# Patient Record
Sex: Male | Born: 1960 | Race: Black or African American | Hispanic: No | Marital: Single | Smoking: Never smoker
Health system: Southern US, Community
[De-identification: ages and names within clinical notes are randomized; demographics above are authoritative.]

## PROBLEM LIST (undated history)

## (undated) DIAGNOSIS — Z9989 Dependence on other enabling machines and devices: Secondary | ICD-10-CM

## (undated) DIAGNOSIS — I428 Other cardiomyopathies: Secondary | ICD-10-CM

## (undated) DIAGNOSIS — I472 Ventricular tachycardia, unspecified: Secondary | ICD-10-CM

## (undated) DIAGNOSIS — K819 Cholecystitis, unspecified: Secondary | ICD-10-CM

## (undated) DIAGNOSIS — I10 Essential (primary) hypertension: Secondary | ICD-10-CM

## (undated) DIAGNOSIS — E785 Hyperlipidemia, unspecified: Secondary | ICD-10-CM

## (undated) DIAGNOSIS — Z95810 Presence of automatic (implantable) cardiac defibrillator: Secondary | ICD-10-CM

## (undated) DIAGNOSIS — M1990 Unspecified osteoarthritis, unspecified site: Secondary | ICD-10-CM

## (undated) DIAGNOSIS — K5730 Diverticulosis of large intestine without perforation or abscess without bleeding: Secondary | ICD-10-CM

## (undated) DIAGNOSIS — F1010 Alcohol abuse, uncomplicated: Secondary | ICD-10-CM

## (undated) DIAGNOSIS — E119 Type 2 diabetes mellitus without complications: Secondary | ICD-10-CM

## (undated) DIAGNOSIS — I480 Paroxysmal atrial fibrillation: Secondary | ICD-10-CM

## (undated) DIAGNOSIS — I5022 Chronic systolic (congestive) heart failure: Secondary | ICD-10-CM

## (undated) DIAGNOSIS — D649 Anemia, unspecified: Secondary | ICD-10-CM

## (undated) DIAGNOSIS — J189 Pneumonia, unspecified organism: Secondary | ICD-10-CM

## (undated) DIAGNOSIS — I272 Pulmonary hypertension, unspecified: Secondary | ICD-10-CM

## (undated) DIAGNOSIS — G4733 Obstructive sleep apnea (adult) (pediatric): Secondary | ICD-10-CM

## (undated) DIAGNOSIS — N289 Disorder of kidney and ureter, unspecified: Secondary | ICD-10-CM

## (undated) DIAGNOSIS — K648 Other hemorrhoids: Secondary | ICD-10-CM

## (undated) HISTORY — DX: Diverticulosis of large intestine without perforation or abscess without bleeding: K57.30

## (undated) HISTORY — DX: Other hemorrhoids: K64.8

## (undated) HISTORY — DX: Paroxysmal atrial fibrillation: I48.0

## (undated) HISTORY — DX: Alcohol abuse, uncomplicated: F10.10

## (undated) HISTORY — DX: Essential (primary) hypertension: I10

## (undated) HISTORY — DX: Anemia, unspecified: D64.9

## (undated) HISTORY — DX: Morbid (severe) obesity due to excess calories: E66.01

## (undated) HISTORY — DX: Ventricular tachycardia, unspecified: I47.20

## (undated) HISTORY — DX: Chronic systolic (congestive) heart failure: I50.22

## (undated) HISTORY — DX: Pulmonary hypertension, unspecified: I27.20

## (undated) HISTORY — DX: Ventricular tachycardia: I47.2

## (undated) HISTORY — DX: Cholecystitis, unspecified: K81.9

---

## 1998-02-26 ENCOUNTER — Emergency Department (HOSPITAL_COMMUNITY): Admission: EM | Admit: 1998-02-26 | Discharge: 1998-02-26 | Payer: Self-pay | Admitting: Emergency Medicine

## 1998-05-30 ENCOUNTER — Ambulatory Visit (HOSPITAL_COMMUNITY): Admission: RE | Admit: 1998-05-30 | Discharge: 1998-05-30 | Payer: Self-pay

## 1998-07-18 ENCOUNTER — Ambulatory Visit (HOSPITAL_COMMUNITY): Admission: RE | Admit: 1998-07-18 | Discharge: 1998-07-18 | Payer: Self-pay | Admitting: Neurosurgery

## 1998-08-02 ENCOUNTER — Encounter: Payer: Self-pay | Admitting: Neurosurgery

## 1998-08-02 ENCOUNTER — Ambulatory Visit (HOSPITAL_COMMUNITY): Admission: RE | Admit: 1998-08-02 | Discharge: 1998-08-02 | Payer: Self-pay | Admitting: Neurosurgery

## 1998-08-08 ENCOUNTER — Encounter: Payer: Self-pay | Admitting: Neurosurgery

## 1998-08-08 ENCOUNTER — Ambulatory Visit (HOSPITAL_COMMUNITY): Admission: RE | Admit: 1998-08-08 | Discharge: 1998-08-08 | Payer: Self-pay | Admitting: Neurosurgery

## 1998-08-17 ENCOUNTER — Ambulatory Visit (HOSPITAL_COMMUNITY): Admission: RE | Admit: 1998-08-17 | Discharge: 1998-08-17 | Payer: Self-pay | Admitting: Neurosurgery

## 1998-08-17 ENCOUNTER — Encounter: Payer: Self-pay | Admitting: Neurosurgery

## 1998-09-04 ENCOUNTER — Encounter: Payer: Self-pay | Admitting: Neurosurgery

## 1998-09-04 ENCOUNTER — Ambulatory Visit (HOSPITAL_COMMUNITY): Admission: RE | Admit: 1998-09-04 | Discharge: 1998-09-04 | Payer: Self-pay | Admitting: Neurosurgery

## 1999-09-23 ENCOUNTER — Emergency Department (HOSPITAL_COMMUNITY): Admission: EM | Admit: 1999-09-23 | Discharge: 1999-09-23 | Payer: Self-pay | Admitting: Internal Medicine

## 2000-04-24 ENCOUNTER — Encounter: Payer: Self-pay | Admitting: Emergency Medicine

## 2000-04-24 ENCOUNTER — Emergency Department (HOSPITAL_COMMUNITY): Admission: EM | Admit: 2000-04-24 | Discharge: 2000-04-24 | Payer: Self-pay | Admitting: Emergency Medicine

## 2001-01-22 ENCOUNTER — Encounter: Payer: Self-pay | Admitting: Emergency Medicine

## 2001-01-22 ENCOUNTER — Inpatient Hospital Stay (HOSPITAL_COMMUNITY): Admission: EM | Admit: 2001-01-22 | Discharge: 2001-01-25 | Payer: Self-pay | Admitting: Emergency Medicine

## 2001-01-23 ENCOUNTER — Encounter: Payer: Self-pay | Admitting: Internal Medicine

## 2001-03-31 ENCOUNTER — Encounter: Admission: RE | Admit: 2001-03-31 | Discharge: 2001-06-29 | Payer: Self-pay | Admitting: Family Medicine

## 2001-04-06 ENCOUNTER — Encounter: Payer: Self-pay | Admitting: Internal Medicine

## 2001-04-06 ENCOUNTER — Ambulatory Visit (HOSPITAL_BASED_OUTPATIENT_CLINIC_OR_DEPARTMENT_OTHER): Admission: RE | Admit: 2001-04-06 | Discharge: 2001-04-06 | Payer: Self-pay | Admitting: Family Medicine

## 2001-07-02 ENCOUNTER — Encounter: Payer: Self-pay | Admitting: Pulmonary Disease

## 2001-09-06 ENCOUNTER — Encounter: Payer: Self-pay | Admitting: Emergency Medicine

## 2001-09-06 ENCOUNTER — Inpatient Hospital Stay (HOSPITAL_COMMUNITY): Admission: EM | Admit: 2001-09-06 | Discharge: 2001-09-10 | Payer: Self-pay | Admitting: Emergency Medicine

## 2001-09-08 ENCOUNTER — Encounter: Payer: Self-pay | Admitting: Internal Medicine

## 2001-10-23 ENCOUNTER — Emergency Department (HOSPITAL_COMMUNITY): Admission: EM | Admit: 2001-10-23 | Discharge: 2001-10-23 | Payer: Self-pay | Admitting: *Deleted

## 2002-02-17 ENCOUNTER — Encounter: Payer: Self-pay | Admitting: Pulmonary Disease

## 2002-07-16 ENCOUNTER — Encounter: Payer: Self-pay | Admitting: Emergency Medicine

## 2002-07-17 ENCOUNTER — Inpatient Hospital Stay (HOSPITAL_COMMUNITY): Admission: EM | Admit: 2002-07-17 | Discharge: 2002-07-18 | Payer: Self-pay | Admitting: Emergency Medicine

## 2004-03-07 ENCOUNTER — Inpatient Hospital Stay (HOSPITAL_COMMUNITY): Admission: EM | Admit: 2004-03-07 | Discharge: 2004-03-11 | Payer: Self-pay

## 2004-03-07 ENCOUNTER — Encounter (INDEPENDENT_AMBULATORY_CARE_PROVIDER_SITE_OTHER): Payer: Self-pay | Admitting: Cardiovascular Disease

## 2004-03-08 HISTORY — PX: CARDIAC CATHETERIZATION: SHX172

## 2004-04-05 ENCOUNTER — Ambulatory Visit (HOSPITAL_COMMUNITY): Admission: RE | Admit: 2004-04-05 | Discharge: 2004-04-05 | Payer: Self-pay | Admitting: Family Medicine

## 2004-07-31 ENCOUNTER — Ambulatory Visit: Payer: Self-pay | Admitting: *Deleted

## 2004-08-21 ENCOUNTER — Ambulatory Visit: Payer: Self-pay | Admitting: Pulmonary Disease

## 2004-09-23 ENCOUNTER — Ambulatory Visit: Payer: Self-pay | Admitting: Family Medicine

## 2005-02-13 ENCOUNTER — Ambulatory Visit: Payer: Self-pay | Admitting: Internal Medicine

## 2005-04-23 ENCOUNTER — Ambulatory Visit: Payer: Self-pay | Admitting: Internal Medicine

## 2005-05-06 ENCOUNTER — Encounter: Admission: RE | Admit: 2005-05-06 | Discharge: 2005-08-04 | Payer: Self-pay | Admitting: Internal Medicine

## 2005-07-01 ENCOUNTER — Ambulatory Visit: Payer: Self-pay | Admitting: Internal Medicine

## 2005-08-27 ENCOUNTER — Ambulatory Visit (HOSPITAL_COMMUNITY): Admission: RE | Admit: 2005-08-27 | Discharge: 2005-08-27 | Payer: Self-pay | Admitting: *Deleted

## 2005-10-15 ENCOUNTER — Ambulatory Visit: Payer: Self-pay | Admitting: Internal Medicine

## 2006-01-21 ENCOUNTER — Ambulatory Visit: Payer: Self-pay | Admitting: Pulmonary Disease

## 2006-03-21 ENCOUNTER — Emergency Department (HOSPITAL_COMMUNITY): Admission: EM | Admit: 2006-03-21 | Discharge: 2006-03-21 | Payer: Self-pay | Admitting: Emergency Medicine

## 2006-04-28 ENCOUNTER — Ambulatory Visit: Payer: Self-pay | Admitting: Internal Medicine

## 2006-06-16 ENCOUNTER — Ambulatory Visit: Payer: Self-pay | Admitting: Internal Medicine

## 2006-06-16 ENCOUNTER — Encounter: Payer: Self-pay | Admitting: Cardiovascular Disease

## 2006-06-16 ENCOUNTER — Inpatient Hospital Stay (HOSPITAL_COMMUNITY): Admission: EM | Admit: 2006-06-16 | Discharge: 2006-06-19 | Payer: Self-pay | Admitting: Emergency Medicine

## 2006-07-01 ENCOUNTER — Ambulatory Visit: Payer: Self-pay | Admitting: Internal Medicine

## 2007-01-28 ENCOUNTER — Ambulatory Visit: Payer: Self-pay | Admitting: Internal Medicine

## 2007-01-28 LAB — CONVERTED CEMR LAB
ALT: 28 units/L (ref 0–40)
Albumin: 3.9 g/dL (ref 3.5–5.2)
Alkaline Phosphatase: 85 units/L (ref 39–117)
BUN: 15 mg/dL (ref 6–23)
CO2: 32 meq/L (ref 19–32)
Creatinine, Ser: 1 mg/dL (ref 0.4–1.5)
Creatinine,U: 227.8 mg/dL
Glucose, Bld: 127 mg/dL — ABNORMAL HIGH (ref 70–99)
Hgb A1c MFr Bld: 6.6 % — ABNORMAL HIGH (ref 4.6–6.0)
Sodium: 139 meq/L (ref 135–145)
Total Bilirubin: 0.7 mg/dL (ref 0.3–1.2)
Total Protein: 8.1 g/dL (ref 6.0–8.3)

## 2007-02-05 ENCOUNTER — Ambulatory Visit: Payer: Self-pay | Admitting: Pulmonary Disease

## 2007-05-07 DIAGNOSIS — E1165 Type 2 diabetes mellitus with hyperglycemia: Secondary | ICD-10-CM

## 2007-05-07 DIAGNOSIS — J45909 Unspecified asthma, uncomplicated: Secondary | ICD-10-CM | POA: Insufficient documentation

## 2007-05-07 DIAGNOSIS — D509 Iron deficiency anemia, unspecified: Secondary | ICD-10-CM

## 2007-05-07 DIAGNOSIS — I509 Heart failure, unspecified: Secondary | ICD-10-CM | POA: Insufficient documentation

## 2007-05-07 DIAGNOSIS — G4733 Obstructive sleep apnea (adult) (pediatric): Secondary | ICD-10-CM

## 2007-05-07 DIAGNOSIS — M109 Gout, unspecified: Secondary | ICD-10-CM

## 2007-05-07 DIAGNOSIS — I10 Essential (primary) hypertension: Secondary | ICD-10-CM | POA: Insufficient documentation

## 2007-05-07 DIAGNOSIS — Z794 Long term (current) use of insulin: Secondary | ICD-10-CM

## 2007-05-17 ENCOUNTER — Ambulatory Visit: Payer: Self-pay | Admitting: Internal Medicine

## 2007-05-21 ENCOUNTER — Ambulatory Visit: Payer: Self-pay | Admitting: Internal Medicine

## 2007-06-22 ENCOUNTER — Ambulatory Visit: Payer: Self-pay | Admitting: Internal Medicine

## 2007-07-30 ENCOUNTER — Encounter: Payer: Self-pay | Admitting: Internal Medicine

## 2007-10-04 ENCOUNTER — Emergency Department (HOSPITAL_COMMUNITY): Admission: EM | Admit: 2007-10-04 | Discharge: 2007-10-05 | Payer: Self-pay | Admitting: Emergency Medicine

## 2007-10-14 ENCOUNTER — Ambulatory Visit: Payer: Self-pay | Admitting: Internal Medicine

## 2007-10-20 ENCOUNTER — Telehealth: Payer: Self-pay | Admitting: Internal Medicine

## 2007-10-27 LAB — CONVERTED CEMR LAB
ALT: 66 units/L — ABNORMAL HIGH (ref 0–53)
BUN: 22 mg/dL (ref 6–23)
Bilirubin, Direct: 0.3 mg/dL (ref 0.0–0.3)
Creatinine, Ser: 1.6 mg/dL — ABNORMAL HIGH (ref 0.4–1.5)
Hgb A1c MFr Bld: 6.3 % — ABNORMAL HIGH (ref 4.6–6.0)
Microalb Creat Ratio: 646.4 mg/g — ABNORMAL HIGH (ref 0.0–30.0)
Potassium: 4.5 meq/L (ref 3.5–5.1)
Sodium: 134 meq/L — ABNORMAL LOW (ref 135–145)
Total Bilirubin: 1.3 mg/dL — ABNORMAL HIGH (ref 0.3–1.2)

## 2007-11-05 ENCOUNTER — Ambulatory Visit: Payer: Self-pay | Admitting: Internal Medicine

## 2007-11-05 ENCOUNTER — Encounter (INDEPENDENT_AMBULATORY_CARE_PROVIDER_SITE_OTHER): Payer: Self-pay | Admitting: *Deleted

## 2007-11-05 DIAGNOSIS — R109 Unspecified abdominal pain: Secondary | ICD-10-CM | POA: Insufficient documentation

## 2007-11-05 DIAGNOSIS — K625 Hemorrhage of anus and rectum: Secondary | ICD-10-CM | POA: Insufficient documentation

## 2007-11-09 LAB — CONVERTED CEMR LAB
AST: 30 units/L (ref 0–37)
BUN: 18 mg/dL (ref 6–23)
Bilirubin, Direct: 0.6 mg/dL — ABNORMAL HIGH (ref 0.0–0.3)
CO2: 30 meq/L (ref 19–32)
Chloride: 101 meq/L (ref 96–112)
GFR calc Af Amer: 65 mL/min
GFR calc non Af Amer: 54 mL/min
Glucose, Bld: 129 mg/dL — ABNORMAL HIGH (ref 70–99)
Potassium: 4.5 meq/L (ref 3.5–5.1)
Total Bilirubin: 2.5 mg/dL — ABNORMAL HIGH (ref 0.3–1.2)

## 2007-11-16 ENCOUNTER — Encounter: Payer: Self-pay | Admitting: Internal Medicine

## 2007-12-17 ENCOUNTER — Ambulatory Visit: Payer: Self-pay | Admitting: Internal Medicine

## 2007-12-17 ENCOUNTER — Inpatient Hospital Stay (HOSPITAL_COMMUNITY): Admission: EM | Admit: 2007-12-17 | Discharge: 2007-12-21 | Payer: Self-pay | Admitting: Emergency Medicine

## 2007-12-20 ENCOUNTER — Encounter (INDEPENDENT_AMBULATORY_CARE_PROVIDER_SITE_OTHER): Payer: Self-pay | Admitting: *Deleted

## 2007-12-21 ENCOUNTER — Encounter: Payer: Self-pay | Admitting: Internal Medicine

## 2007-12-22 ENCOUNTER — Ambulatory Visit: Payer: Self-pay | Admitting: Internal Medicine

## 2008-01-05 ENCOUNTER — Encounter: Payer: Self-pay | Admitting: Internal Medicine

## 2008-02-07 ENCOUNTER — Telehealth: Payer: Self-pay | Admitting: Internal Medicine

## 2008-02-14 ENCOUNTER — Encounter: Payer: Self-pay | Admitting: Internal Medicine

## 2008-03-10 ENCOUNTER — Telehealth: Payer: Self-pay | Admitting: Internal Medicine

## 2008-03-24 ENCOUNTER — Telehealth: Payer: Self-pay | Admitting: Internal Medicine

## 2008-03-27 ENCOUNTER — Ambulatory Visit: Payer: Self-pay | Admitting: Pulmonary Disease

## 2008-03-30 ENCOUNTER — Encounter: Payer: Self-pay | Admitting: Internal Medicine

## 2008-03-30 ENCOUNTER — Ambulatory Visit (HOSPITAL_COMMUNITY): Admission: RE | Admit: 2008-03-30 | Discharge: 2008-03-30 | Payer: Self-pay | Admitting: Internal Medicine

## 2008-04-03 ENCOUNTER — Ambulatory Visit: Payer: Self-pay | Admitting: Internal Medicine

## 2008-04-04 ENCOUNTER — Ambulatory Visit: Payer: Self-pay | Admitting: Internal Medicine

## 2008-04-04 DIAGNOSIS — R1013 Epigastric pain: Secondary | ICD-10-CM

## 2008-04-04 DIAGNOSIS — K3189 Other diseases of stomach and duodenum: Secondary | ICD-10-CM

## 2008-04-04 LAB — CONVERTED CEMR LAB
ALT: 26 units/L (ref 0–53)
AST: 30 units/L (ref 0–37)
Albumin: 3.4 g/dL — ABNORMAL LOW (ref 3.5–5.2)
Alkaline Phosphatase: 90 units/L (ref 39–117)
Bilirubin, Direct: 0.8 mg/dL — ABNORMAL HIGH (ref 0.0–0.3)
CO2: 28 meq/L (ref 19–32)
Digitoxin Lvl: 0.5 ng/mL — ABNORMAL LOW (ref 0.8–2.0)
Glucose, Bld: 124 mg/dL — ABNORMAL HIGH (ref 70–99)
LDL Cholesterol: 78 mg/dL (ref 0–99)
Total Bilirubin: 2.4 mg/dL — ABNORMAL HIGH (ref 0.3–1.2)
Total Protein: 6.7 g/dL (ref 6.0–8.3)

## 2008-04-09 ENCOUNTER — Encounter: Payer: Self-pay | Admitting: Internal Medicine

## 2008-04-10 ENCOUNTER — Encounter: Admission: RE | Admit: 2008-04-10 | Discharge: 2008-04-10 | Payer: Self-pay | Admitting: Internal Medicine

## 2008-04-10 ENCOUNTER — Telehealth: Payer: Self-pay | Admitting: Internal Medicine

## 2008-04-10 DIAGNOSIS — K819 Cholecystitis, unspecified: Secondary | ICD-10-CM

## 2008-04-12 ENCOUNTER — Telehealth: Payer: Self-pay | Admitting: Pulmonary Disease

## 2008-04-17 ENCOUNTER — Telehealth: Payer: Self-pay | Admitting: Internal Medicine

## 2008-04-18 ENCOUNTER — Encounter: Payer: Self-pay | Admitting: Internal Medicine

## 2008-04-27 ENCOUNTER — Ambulatory Visit: Payer: Self-pay | Admitting: Internal Medicine

## 2008-04-27 ENCOUNTER — Telehealth: Payer: Self-pay | Admitting: Internal Medicine

## 2008-04-27 LAB — CONVERTED CEMR LAB
AST: 29 units/L (ref 0–37)
Basophils Relative: 0.8 % (ref 0.0–3.0)
Eosinophils Absolute: 0.1 10*3/uL (ref 0.0–0.7)
Eosinophils Relative: 2.3 % (ref 0.0–5.0)
Hemoglobin: 14 g/dL (ref 13.0–17.0)
Lymphocytes Relative: 42.2 % (ref 12.0–46.0)
MCHC: 33.3 g/dL (ref 30.0–36.0)
MCV: 88.2 fL (ref 78.0–100.0)
Monocytes Absolute: 0.5 10*3/uL (ref 0.1–1.0)
Monocytes Relative: 10.8 % (ref 3.0–12.0)
Neutro Abs: 1.8 10*3/uL (ref 1.4–7.7)
Neutrophils Relative %: 43.9 % (ref 43.0–77.0)
RBC: 4.76 M/uL (ref 4.22–5.81)
Total Protein: 7.1 g/dL (ref 6.0–8.3)

## 2008-05-01 ENCOUNTER — Ambulatory Visit (HOSPITAL_COMMUNITY): Admission: RE | Admit: 2008-05-01 | Discharge: 2008-05-01 | Payer: Self-pay | Admitting: Internal Medicine

## 2008-05-02 DIAGNOSIS — F1021 Alcohol dependence, in remission: Secondary | ICD-10-CM

## 2008-05-02 DIAGNOSIS — K648 Other hemorrhoids: Secondary | ICD-10-CM | POA: Insufficient documentation

## 2008-05-02 DIAGNOSIS — I272 Pulmonary hypertension, unspecified: Secondary | ICD-10-CM

## 2008-05-02 DIAGNOSIS — K5730 Diverticulosis of large intestine without perforation or abscess without bleeding: Secondary | ICD-10-CM | POA: Insufficient documentation

## 2008-05-02 DIAGNOSIS — E669 Obesity, unspecified: Secondary | ICD-10-CM | POA: Insufficient documentation

## 2008-05-23 ENCOUNTER — Encounter: Payer: Self-pay | Admitting: Internal Medicine

## 2008-05-23 ENCOUNTER — Telehealth: Payer: Self-pay | Admitting: Internal Medicine

## 2008-05-27 ENCOUNTER — Inpatient Hospital Stay (HOSPITAL_COMMUNITY): Admission: EM | Admit: 2008-05-27 | Discharge: 2008-06-07 | Payer: Self-pay | Admitting: Emergency Medicine

## 2008-05-27 ENCOUNTER — Ambulatory Visit: Payer: Self-pay | Admitting: Internal Medicine

## 2008-05-27 ENCOUNTER — Encounter: Payer: Self-pay | Admitting: Cardiovascular Disease

## 2008-05-27 HISTORY — PX: TRANSTHORACIC ECHOCARDIOGRAM: SHX275

## 2008-05-30 ENCOUNTER — Ambulatory Visit: Payer: Self-pay | Admitting: Gastroenterology

## 2008-06-06 HISTORY — PX: INSERTION OF ICD: SHX6689

## 2008-06-20 ENCOUNTER — Telehealth: Payer: Self-pay | Admitting: Internal Medicine

## 2008-08-22 HISTORY — PX: US ECHOCARDIOGRAPHY: HXRAD669

## 2009-03-16 IMAGING — CR DG ABDOMEN ACUTE W/ 1V CHEST
4 series · 4 of 4 positions shown · non-contrast
Comparison: 03/21/06.

This report is delayed due to PACS failure.
CLINICAL DATA: ACUTE ABDOMINAL SERIES ? 3 VIEW:

[w chest pa]
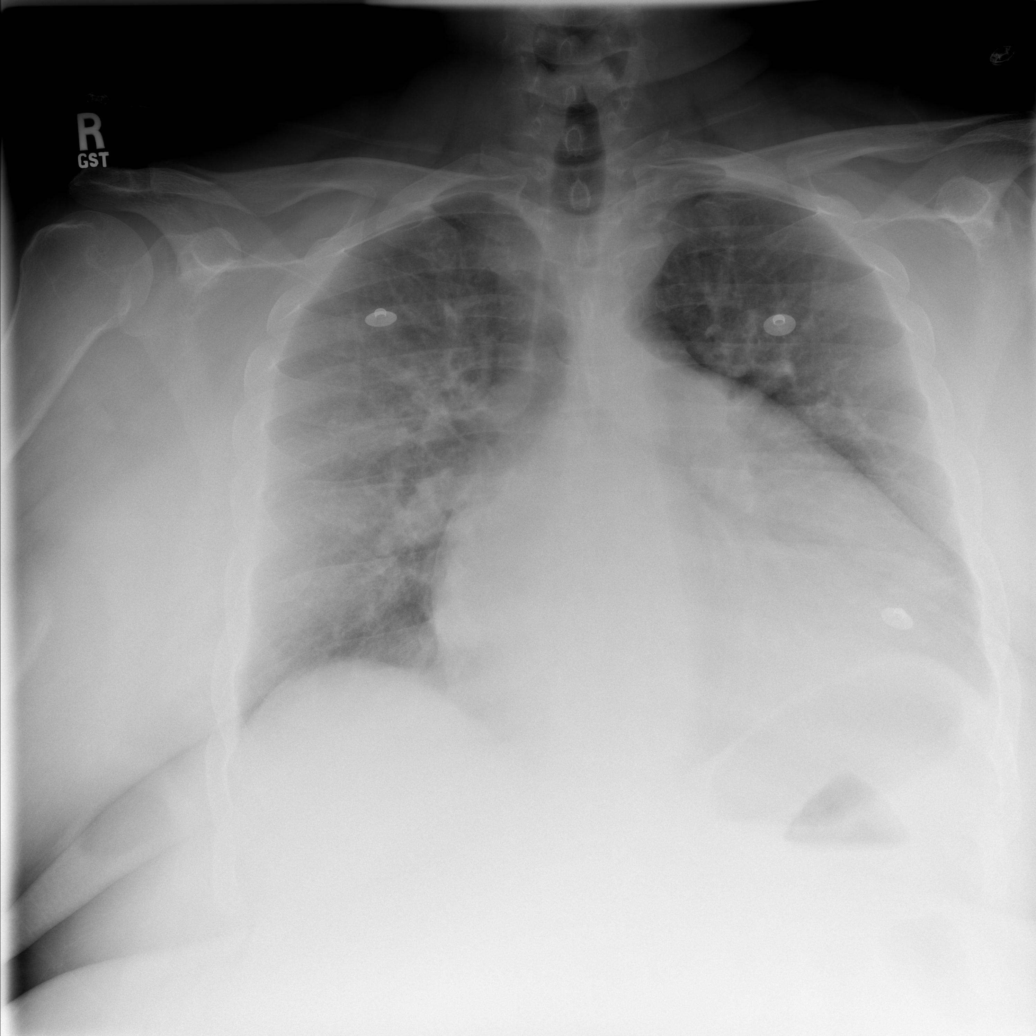

[w abdomen upright *]
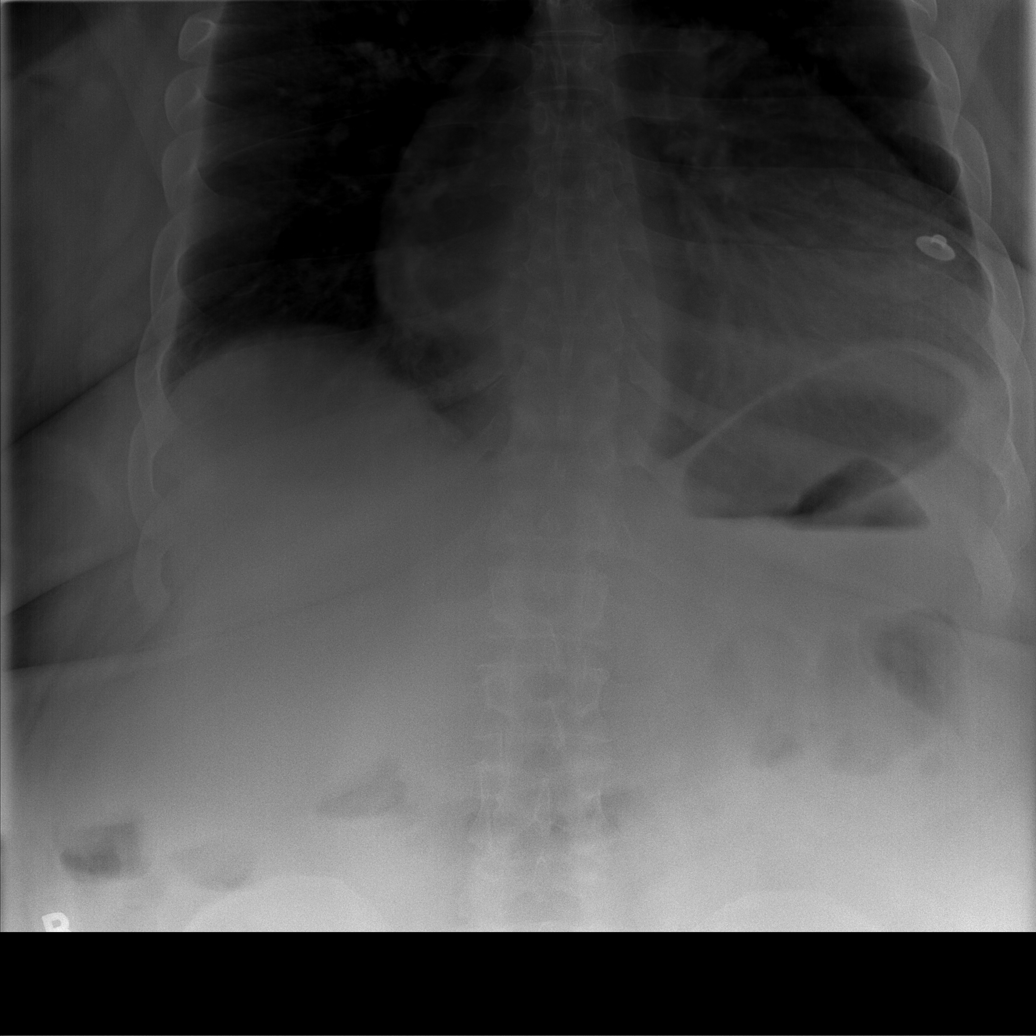

[t abdomen supine]
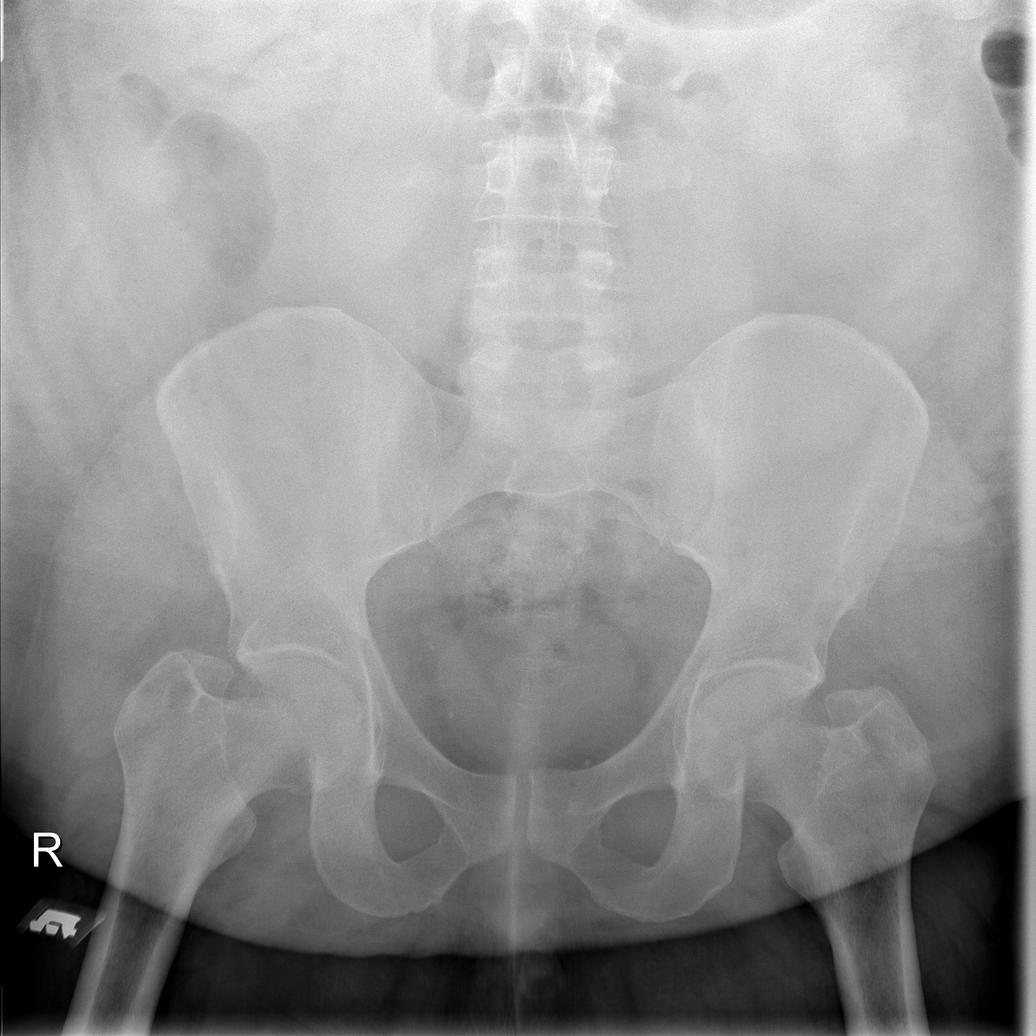

[t abdomen supine *]
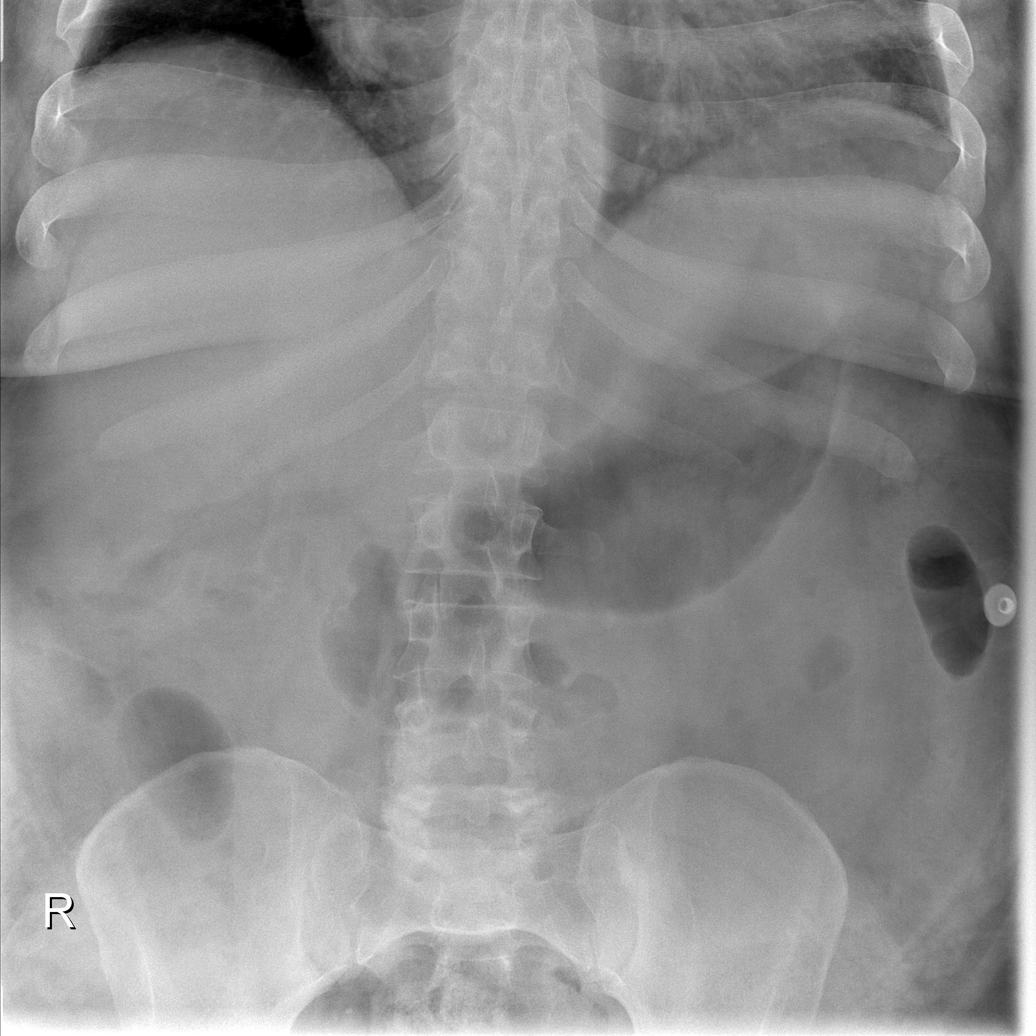

[4 of 4 positions shown; findings below may reference images not displayed]

FINDINGS: Shortness of breath and cough.  Abdominal distention.  The patient has marked cardiomegaly which is chronic.  However, there is now pulmonary vascular congestion without discrete pulmonary edema.  There is no free air or free fluid in the abdomen.  No dilated bowel.  No significant bony abnormality.
IMPRESSION: Benign appearing abdomen.  Cardiomegaly with pulmonary vascular congestion.

## 2009-07-27 ENCOUNTER — Emergency Department (HOSPITAL_COMMUNITY): Admission: EM | Admit: 2009-07-27 | Discharge: 2009-07-27 | Payer: Self-pay | Admitting: Emergency Medicine

## 2009-08-17 ENCOUNTER — Emergency Department (HOSPITAL_COMMUNITY): Admission: EM | Admit: 2009-08-17 | Discharge: 2009-08-17 | Payer: Self-pay | Admitting: Emergency Medicine

## 2009-08-24 ENCOUNTER — Ambulatory Visit: Payer: Self-pay | Admitting: Internal Medicine

## 2009-12-06 DIAGNOSIS — Z95810 Presence of automatic (implantable) cardiac defibrillator: Secondary | ICD-10-CM

## 2009-12-14 ENCOUNTER — Ambulatory Visit: Payer: Self-pay | Admitting: Internal Medicine

## 2010-03-12 ENCOUNTER — Ambulatory Visit: Payer: Self-pay | Admitting: Internal Medicine

## 2010-03-12 ENCOUNTER — Encounter: Payer: Self-pay | Admitting: Internal Medicine

## 2010-03-26 ENCOUNTER — Encounter: Payer: Self-pay | Admitting: Internal Medicine

## 2010-05-31 ENCOUNTER — Ambulatory Visit: Payer: Self-pay | Admitting: Cardiovascular Disease

## 2010-06-13 ENCOUNTER — Ambulatory Visit: Payer: Self-pay | Admitting: Internal Medicine

## 2010-06-21 ENCOUNTER — Encounter: Payer: Self-pay | Admitting: Internal Medicine

## 2010-07-01 ENCOUNTER — Encounter: Payer: Self-pay | Admitting: Internal Medicine

## 2010-09-02 ENCOUNTER — Telehealth: Payer: Self-pay | Admitting: Internal Medicine

## 2010-09-13 ENCOUNTER — Ambulatory Visit: Payer: Self-pay | Admitting: Pulmonary Disease

## 2010-09-19 ENCOUNTER — Ambulatory Visit: Payer: Self-pay | Admitting: Internal Medicine

## 2010-10-14 ENCOUNTER — Telehealth (INDEPENDENT_AMBULATORY_CARE_PROVIDER_SITE_OTHER): Payer: Self-pay | Admitting: *Deleted

## 2010-10-16 ENCOUNTER — Encounter (INDEPENDENT_AMBULATORY_CARE_PROVIDER_SITE_OTHER): Payer: Self-pay | Admitting: *Deleted

## 2010-10-25 ENCOUNTER — Telehealth: Payer: Self-pay | Admitting: Internal Medicine

## 2010-10-28 ENCOUNTER — Encounter: Payer: Self-pay | Admitting: Internal Medicine

## 2010-11-01 ENCOUNTER — Ambulatory Visit: Payer: Self-pay | Admitting: Cardiovascular Disease

## 2010-11-05 NOTE — Letter (Signed)
Summary: Remote Device Check  [facility], Main Office  [address]   [place], [place] [address]   Phone: [phone]  Fax: [phone]     March 26, 2010 MRN: [id number]   [facility] [address] [place], [place]  [address]   Dear Gary Hutchinson,   Your remote transmission was recieved and reviewed by your physician.  All diagnostics were within normal limits for you.  __X___Your next transmission is scheduled for:  06-13-2010.  Please transmit at any time this day.  If you have a wireless device your transmission will be sent automatically.   Sincerely,  Vella Kohler

## 2010-11-05 NOTE — Progress Notes (Signed)
Summary: Refill denial   Phone Note Refill Request Message from:  Fax from Pharmacy on September 02, 2010 11:57 AM  Refills Requested: Medication #1:  ONETOUCH ULTRA TEST  STRP test blood sugar two times a day as needed   Dosage confirmed as above?Dosage Confirmed   Brand Name Necessary? No   Supply Requested: 1 month   Last Ref[facility]: [facility] [address]    Method Requested: Electronic Next Appointment Scheduled: 09-19-2010 Pacer  Initial call taken by: Roselle Locus,  September 02, 2010 11:58 AM  Follow-up for Phone Call        Rx denied because patient will need office visit. pharmacy aware Follow-up by: Glendell Docker CMA,  September 02, 2010 2:59 PM

## 2010-11-05 NOTE — Assessment & Plan Note (Signed)
Summary: defib check.[email or web address]  Medications Added SINGULAIR 10 MG  TABS (MONTELUKAST SODIUM) Take 1 tablet by mouth once a day as needed ASPIRIN 81 MG TBEC (ASPIRIN) Take one tablet by mouth daily ADVAIR DISKUS 250-50 MCG/DOSE  MISC (FLUTICASONE-SALMETEROL) inhale 1 puff by mouth two times a day as needed FUROSEMIDE 40 MG TABS (FUROSEMIDE) Take one tablet by mouth two times a day COLCHICINE 0.6 MG  TABS (COLCHICINE) as needed LANOXIN 0.25 MG TABS (DIGOXIN) Take 1 tablet by mouth once a day ALLEGRA-D 24 HOUR 180-240 MG  TB24 (FEXOFENADINE-PSEUDOEPHEDRINE) Take 1 tablet by mouth once a day as needed SPIRONOLACTONE 25 MG TABS (SPIRONOLACTONE) Take one tablet by mouth daily      Allergies Added: NKDA  Visit Type:  Follow-up Primary Provider:  Dondra Spry DO   History of Present Illness: The patient presents today for routine electrophysiology followup. He reports doing very well since having his ICD implanted. The patient denies symptoms of palpitations, chest pain, shortness of breath, orthopnea, PND, lower extremity edema, dizziness, presyncope, syncope, or neurologic sequela.  He continues to struggle with obesity. The patient is tolerating medications without difficulties and is otherwise without complaint today.   Current Medications (verified): 1)  Altace 10 Mg  Tabs (Ramipril) .... By Mouth Once Daily 2)  Klor-Con M20 20 Meq  Tbcr (Potassium Chloride Crys Cr) .... By Mouth Once Daily 3)  Singulair 10 Mg  Tabs (Montelukast Sodium) .... Take 1 Tablet By Mouth Once A Day As Needed 4)  Proair Hfa 108 (90 Base) Mcg/act  Aers (Albuterol Sulfate) .... 2 Puffs Q 4-6 Hrs Prn 5)  Aspirin 81 Mg Tbec (Aspirin) .... Take One Tablet By Mouth Daily 6)  Coreg 12.5 Mg  Tabs (Carvedilol) .... Take 1/2 Tab By Mouth Two Times A Day 7)  Advair Diskus 250-50 Mcg/dose  Misc (Fluticasone-Salmeterol) .... Inhale 1 Puff By Mouth Two Times A Day As Needed 8)  Furosemide 40 Mg Tabs (Furosemide) .... Take One  Tablet By Mouth Two Times A Day 9)  Colchicine 0.6 Mg  Tabs (Colchicine) .... As Needed 10)  Flonase 50 Mcg/act  Susp (Fluticasone Propionate) .... One Spray Each Nostril As Needed Once Daily 11)  Lanoxin 0.25 Mg Tabs (Digoxin) .... Take 1 Tablet By Mouth Once A Day 12)  Onetouch Ultra Test  Strp (Glucose Blood) .... Test Blood Sugar Two Times A Day As Needed 13)  Onetouch Ultrasoft Lancets   Misc (Lancets) .... Test Blood Sugar Two Times A Day As Needed 14)  Allegra-D 24 Hour 180-240 Mg  Tb24 (Fexofenadine-Pseudoephedrine) .... Take 1 Tablet By Mouth Once A Day As Needed 15)  Spironolactone 25 Mg Tabs (Spironolactone) .... Take One Tablet By Mouth Daily  Allergies (verified): No Known Drug Allergies  Past History:  Past Medical History: Nonischemic CM NSVT s/p MDT ICD implant 2009 ALCOHOL ABUSE, HX OF (ICD-V11.3) HYPERTENSION, PULMONARY (ICD-416.8) DIVERTICULOSIS OF COLON (ICD-562.10) HEMORRHOIDS, INTERNAL (ICD-455.0) CHOLECYSTITIS, UNSPECIFIED (ICD-575.10) DYSPEPSIA (ICD-536.8) RECTAL BLEEDING (ICD-569.3) ABDOMINAL PAIN (ICD-789.00) MORBID OBESITY (ICD-278.01) OBSTRUCTIVE SLEEP APNEA (ICD-327.23) ANEMIA-IRON DEFICIENCY (ICD-280.9) HYPERTENSION (ICD-401.9) GOUT (ICD-274.9) DIABETES MELLITUS, TYPE II (ICD-250.00) CONGESTIVE HEART FAILURE (ICD-428.0) ASTHMA (ICD-493.90)    Past Surgical History: ICD implantation 9/09  Social History: Reviewed history from 05/02/2008 and no changes required. Tobacco - no Former alcohol abuser Single 4 children   Patient is a former smoker.   Vital Signs:  Patient profile:   50 year old male Height:      66 inches Weight:  315 pounds BMI:     51.03 Pulse rate:   79 / minute BP sitting:   98 / 64  (left arm)  Vitals Entered By: Laurance Flatten CMA (December 14, 2009 12:10 PM)  Physical Exam  General:  morbidly obese, NAD Head:  normocephalic and atraumatic Eyes:  PERRLA/EOM intact; conjunctiva and lids normal. Mouth:  Teeth,  gums and palate normal. Oral mucosa normal. Neck:  Neck supple, no JVD. No masses, thyromegaly or abnormal cervical nodes. Chest Wall:  ICD pocket well healed Lungs:  Clear bilaterally to auscultation and percussion. Heart:  RRR, no m/r/g Abdomen:  Bowel sounds positive; abdomen soft and non-tender without masses, organomegaly, or hernias noted. No hepatosplenomegaly. Msk:  Back normal, normal gait. Muscle strength and tone normal. Pulses:  pulses normal in all 4 extremities Neurologic:  Alert and oriented x 3. Skin:  Intact without lesions or rashes. Cervical Nodes:  no significant adenopathy Psych:  Normal affect.    ICD Specifications Following MD:  Hillis Range, MD     Referring MD:  Dr. Melburn Popper ICD Vendor:  Medtronic     ICD Model Number:  D154VWC     ICD Serial Number:  [id number] ICD DOI:  06/06/2008      Lead 1:    Location: RV     DOI: 06/06/2008     Model #: 1884     Serial #: [id number]     Status: active  ICD Follow Up Remote Check?  No Battery Voltage:  3.16 V     Charge Time:  9.8 seconds     Underlying rhythm:  sr@80  ICD Dependent:  No       ICD Device Measurements Right Ventricle:  Amplitude: 12.3 mV, Impedance: 416 ohms, Threshold: 0.5 V at 0.4 msec Shock Impedance: 76 ohms   Episodes Coumadin:  No Shock:  0     ATP:  0     Nonsustained:  0     Ventricular Pacing:  <0.1%  Brady Parameters Mode VVI     Lower Rate Limit:  40      Tachy Zones VF:  200     Next Remote Date:  03/12/2010     Next Cardiology Appt Due:  12/05/2010 Tech Comments:  No parameter changes.  Device function normal. Optivol and thoracic impedance normal.  Carelink transmissions ever 3 months.   ROV 6 months with Cabin crew. MD Comments:  agree  Impression & Recommendations:  Problem # 1:  CONGESTIVE HEART FAILURE (ICD-428.0) The patient has chronic systolic dysfunction, managed by Dr Elease Hashimoto.  He appears to be doing well at this time and is on an optimal medical regimen.  He is s/p ICD  implantation for primary prevention of sudden death due to chronic systolic dysfunction and NSVT. He has done well without ICD shocks delivered. His ICD function is normal today. No changes were made today.  Patient Instructions: 1)  Carelink transmissions. 2)  Return in 6 months 3)  continue to follow-up closely with Dr Elease Hashimoto

## 2010-11-05 NOTE — Cardiovascular Report (Signed)
Summary: Office Visit Remote   Office Visit Remote   Imported By: Roderic Ovens 06/24/2010 15:31:35  _____________________________________________________________________  External Attachment:    Type:   Image     Comment:   External Document

## 2010-11-05 NOTE — Cardiovascular Report (Signed)
Summary: Notice Returned  Notice Returned   Imported By: Debby Freiberg 08/20/2010 14:19:36  _____________________________________________________________________  External Attachment:    Type:   Image     Comment:   External Document

## 2010-11-05 NOTE — Cardiovascular Report (Signed)
Summary: Office Visit  Office Visit   Imported By: Roderic Ovens 12/20/2009 13:16:50  _____________________________________________________________________  External Attachment:    Type:   Image     Comment:   External Document

## 2010-11-05 NOTE — Letter (Signed)
Summary: Remote Device Check  [facility], Main Office  [address]   [place], [place] [address]   Phone: [phone]  Fax: [phone]     June 21, 2010 MRN: [id number]   [facility] [address] [place], [place]  [address]   Dear Mr. Bathe,   Your remote transmission was recieved and reviewed by your physician.  All diagnostics were within normal limits for you.  __X___Your next transmission is scheduled for:  09-19-10.  Please transmit at any time this day.  If you have a wireless device your transmission will be sent automatically.   Sincerely,  Vella Kohler

## 2010-11-07 ENCOUNTER — Ambulatory Visit: Payer: Self-pay | Admitting: Cardiovascular Disease

## 2010-11-07 NOTE — Progress Notes (Signed)
   DDS Request received sent to [facility]  October 14, 2010 1:12 PM

## 2010-11-07 NOTE — Cardiovascular Report (Signed)
Summary: Office Visit Remote   Office Visit Remote   Imported By: Roderic Ovens 10/18/2010 11:00:44  _____________________________________________________________________  External Attachment:    Type:   Image     Comment:   External Document

## 2010-11-07 NOTE — Progress Notes (Signed)
Summary: Test Strip Refill  Phone Note Refill Request Message from:  Fax from Pharmacy on October 25, 2010 7:58 AM  Refills Requested: Medication #1:  ONETOUCH ULTRA TEST  STRP test blood sugar two times a day as needed   Dosage confirmed as above?Dosage Confirmed   Brand Name Necessary? No   Supply Requested: 1 month   [facility] [address] [place] [facility] [place] [phone]    Method Requested: Electronic Next Appointment Scheduled: 12-09-10 cardiology  Initial call taken by: Roselle Locus,  October 25, 2010 7:58 AM  Follow-up for Phone Call        refill x 1 only.  needs OV for additional refills Follow-up by: D. Thomos Lemons DO,  October 26, 2010 5:46 AM    Prescriptions: Letta Pate ULTRA TEST  STRP (GLUCOSE BLOOD) test blood sugar two times a day as needed  #100 x 0   Entered by:   Glendell Docker CMA   Authorized by:   D. Thomos Lemons DO   Signed by:   Glendell Docker CMA on 10/28/2010   Method used:   Electronically to        [facility] [address] [phone]* (retail)       [address]       [place], [place]  [address]       Ph: [phone] or [phone]       Fax: [phone]   RxID:   [id number]

## 2010-11-07 NOTE — Letter (Signed)
Summary: Remote Device Check  [facility], Main Office  [address]   [place], [place] [address]   Phone: [phone]  Fax: [phone]     October 16, 2010 MRN: [id number]   [facility] [address] [place], [place]  [address]   Dear Gary Hutchinson,   Your remote transmission was recieved and reviewed by your physician.  All diagnostics were within normal limits for you.  ___X___Your next office visit is scheduled for: 12-09-10 @ 930 with Dr [name].     Sincerely,  Vella Kohler

## 2010-11-08 NOTE — Cardiovascular Report (Signed)
Summary: Office Visit Remote   Office Visit Remote   Imported By: Roderic Ovens 03/30/2010 11:54:53  _____________________________________________________________________  External Attachment:    Type:   Image     Comment:   External Document

## 2010-11-11 ENCOUNTER — Ambulatory Visit: Payer: Self-pay | Admitting: Cardiovascular Disease

## 2010-11-13 NOTE — Assessment & Plan Note (Signed)
Summary: rov for osa   Primary Provider/Referring Provider:  [name]  CC:  Overdue f/u appt for OSA.  Last seen June 2009.  Wears cpap "almost every night."  Pt states he is due for a new mask.  Denies any complaints with pressure.  DME company is [employer]. Marland Kitchen  History of Present Illness: The pt comes in today for f/u of his known severe osa.  He has not been seen in 3 years, and has gained 27 pounds since his last visit here.  He states that he is wearing cpap compliantly, but is in need of supplies and a new mask.  He feels that he is not sleeping as well as he has in the past.  I have explained to him that his pressure needs increase as his weight increases.  Current Medications (verified): 1)  Altace 10 Mg  Tabs (Ramipril) .... By Mouth Once Daily 2)  Klor-Con M20 20 Meq  Tbcr (Potassium Chloride Crys Cr) .... By Mouth Once Daily 3)  Singulair 10 Mg  Tabs (Montelukast Sodium) .... Take 1 Tablet By Mouth Once A Day As Needed 4)  Proair Hfa 108 (90 Base) Mcg/act  Aers (Albuterol Sulfate) .... 2 Puffs Q 4-6 Hrs Prn 5)  Aspirin 81 Mg Tbec (Aspirin) .... Take One Tablet By Mouth Daily 6)  Coreg 12.5 Mg  Tabs (Carvedilol) .... Take 1/2 Tab By Mouth Two Times A Day 7)  Advair Diskus 250-50 Mcg/dose  Misc (Fluticasone-Salmeterol) .... Inhale 1 Puff By Mouth Two Times A Day As Needed 8)  Furosemide 40 Mg Tabs (Furosemide) .... Take One Tablet By Mouth Two Times A Day 9)  Colchicine 0.6 Mg  Tabs (Colchicine) .... As Needed 10)  Flonase 50 Mcg/act  Susp (Fluticasone Propionate) .... One Spray Each Nostril As Needed Once Daily 11)  Lanoxin 0.25 Mg Tabs (Digoxin) .... Take 1 Tablet By Mouth Once A Day 12)  Onetouch Ultra Test  Strp (Glucose Blood) .... Test Blood Sugar Two Times A Day As Needed 13)  Onetouch Ultrasoft Lancets   Misc (Lancets) .... Test Blood Sugar Two Times A Day As Needed 14)  Allegra-D 24 Hour 180-240 Mg  Tb24 (Fexofenadine-Pseudoephedrine) .... Take 1 Tablet By Mouth Once A Day As  Needed 15)  Spironolactone 25 Mg Tabs (Spironolactone) .... Take One Tablet By Mouth Daily 16)  Glipizide (Unsure of Mg) .... Take 1 Tablet By Mouth Two Times A Day  Allergies (verified): No Known Drug Allergies  Review of Systems       The patient complains of shortness of breath with activity, productive cough, non-productive cough, nasal congestion/difficulty breathing through nose, sneezing, itching, and rash.  The patient denies shortness of breath at rest, coughing up blood, chest pain, irregular heartbeats, acid heartburn, indigestion, loss of appetite, weight change, abdominal pain, difficulty swallowing, sore throat, tooth/dental problems, headaches, ear ache, anxiety, depression, hand/feet swelling, joint stiffness or pain, change in color of mucus, and fever.    Vital Signs:  Patient profile:   50 year old male Height:      66 inches Weight:      357.13 pounds BMI:     57.85 O2 Sat:      96 % on Room air Temp:     97.7 degrees F oral Pulse rate:   90 / minute BP sitting:   114 / 68  (left arm) Cuff size:   large  Vitals Entered By: Arman Filter LPN (October 24, 2010 9:33 AM)  O2 Flow:  Room air CC: Overdue f/u appt for OSA.  Last seen June 2009.  Wears cpap "almost every night."  Pt states he is due for a new mask.  Denies any complaints with pressure.  DME company is [employer].  Comments Medications reviewed with patient Arman Filter LPN  October 24, 2010 9:36 AM    Physical Exam  General:  morbidly obese male in nad Nose:  no skin breakdown or pressure necrosis from cpap mask Extremities:  mild edema noted ,no cyanosis  Neurologic:  awake, but appears sleepy, moves all 4.   Impression & Recommendations:  Problem # 1:  OBSTRUCTIVE SLEEP APNEA (ICD-327.23) the pt states he is compliant with bipap, but has not been here for f/u in 3 years.  He is in need of a new mask and supplies, and I think he needs to have his pressure re-evaluated.  Will set up on auto bipap for  2 weeks, and call him with results.  I have stressed to him the need to work on weight loss, and that he is at the upper limits of bipap already.  He needs to f/u with me yearly.  Medications Added to Medication List This Visit: 1)  Glipizide (unsure of Mg)  .... Take 1 tablet by mouth two times a day  Other Orders: Est. Patient Level III ([address]) DME Referral (DME)  Patient Instructions: 1)  will have advanced contact you about recalibrating your pressure on your bipap machine 2)  work on weight loss. 3)  followup with me in one year    Immunization History:  Influenza Immunization History:    Influenza:  historical (05/06/2010)

## 2010-11-23 ENCOUNTER — Encounter: Payer: Self-pay | Admitting: Pulmonary Disease

## 2010-11-27 NOTE — Miscellaneous (Signed)
Summary: optimal pressure 17/13, +mask leaks   Clinical Lists Changes  Orders: Added new Referral order of DME Referral (DME) - Signed auto bipap shows optimal pressure 17/13, but large leak  adequate compliance.

## 2010-12-09 ENCOUNTER — Encounter: Payer: Self-pay | Admitting: Internal Medicine

## 2010-12-30 ENCOUNTER — Encounter: Payer: Self-pay | Admitting: Internal Medicine

## 2011-01-08 ENCOUNTER — Encounter: Payer: Self-pay | Admitting: Internal Medicine

## 2011-01-08 ENCOUNTER — Ambulatory Visit (INDEPENDENT_AMBULATORY_CARE_PROVIDER_SITE_OTHER): Payer: Self-pay | Admitting: Internal Medicine

## 2011-01-08 ENCOUNTER — Other Ambulatory Visit: Payer: Self-pay | Admitting: Internal Medicine

## 2011-01-08 DIAGNOSIS — I10 Essential (primary) hypertension: Secondary | ICD-10-CM

## 2011-01-08 DIAGNOSIS — I509 Heart failure, unspecified: Secondary | ICD-10-CM

## 2011-01-08 DIAGNOSIS — G4733 Obstructive sleep apnea (adult) (pediatric): Secondary | ICD-10-CM

## 2011-01-08 DIAGNOSIS — I472 Ventricular tachycardia, unspecified: Secondary | ICD-10-CM

## 2011-01-08 DIAGNOSIS — I5043 Acute on chronic combined systolic (congestive) and diastolic (congestive) heart failure: Secondary | ICD-10-CM | POA: Insufficient documentation

## 2011-01-08 DIAGNOSIS — I5022 Chronic systolic (congestive) heart failure: Secondary | ICD-10-CM

## 2011-01-08 DIAGNOSIS — I428 Other cardiomyopathies: Secondary | ICD-10-CM

## 2011-01-08 NOTE — Patient Instructions (Signed)
Your physician wants you to follow-up in: 12 months with Dr Jacquiline Doe will receive a reminder letter in the mail two months in advance. If you don't receive a letter, please call our office to schedule the follow-up appointment.  Remote monitoring is used to monitor your Pacemaker of ICD from home. This monitoring reduces the number of office visits required to check your device to one time per year. It allows [place] to keep an eye on the functioning of your device to ensure it is working properly. You are scheduled for a device check from home on 04/20/2011. You may send your transmission at any time that day. If you have a wireless device, the transmission will be sent automatically. After your physician reviews your transmission, you will receive a postcard with your next transmission date.  Work on weight loss

## 2011-01-08 NOTE — Assessment & Plan Note (Signed)
At goal. No changes.  

## 2011-01-08 NOTE — Assessment & Plan Note (Signed)
Compliance with CPAP encouraged 

## 2011-01-08 NOTE — Progress Notes (Signed)
The patient presents today for routine electrophysiology followup.  Since last being seen in our clinic, he reports doing very well.  Unfortunately, he has gained 45 pounds!  His primary concern is with sleep apnea.  He recently had his CPAP adjusted and finds some improvement in sleep with this.  He reports compliance with ETOH cessation.  Today, he denies symptoms of palpitations, chest pain, shortness of breath, orthopnea, PND, lower extremity edema, dizziness, presyncope, syncope, or neurologic sequela.  The patient feels that he is tolerating medications without difficulties and is otherwise without complaint today.   Past Medical History  Diagnosis Date  . Cardiomyopathy     nonischemic  . Ventricular tachycardia     s/p MDT ICD implant  . Alcohol abuse     now quit  . Pulmonary hypertension   . Diverticulosis of colon   . Hemorrhoids, internal   . Cholecystitis   . Dyspepsia   . Morbid obesity   . Obstructive sleep apnea   . Anemia     iron defi  . Hypertension   . Gout   . Diabetes mellitus   . Chronic systolic congestive heart failure   . Asthma    Past Surgical History  Procedure Date  . Icd implantation     ICD- Medtronic 9/09    Current Outpatient Prescriptions  Medication Sig Dispense Refill  . albuterol (PROAIR HFA) 108 (90 BASE) MCG/ACT inhaler Inhale 2 puffs into the lungs every 6 (six) hours as needed.        Marland Kitchen aspirin 81 MG tablet Take 81 mg by mouth daily.        . colchicine 0.6 MG tablet Take 0.6 mg by mouth as needed.       . digoxin (LANOXIN) 0.25 MG tablet Take 250 mcg by mouth daily.        . fexofenadine (ALLEGRA) 180 MG tablet Take 180 mg by mouth as needed.       . Fluticasone-Salmeterol (ADVAIR DISKUS) 250-50 MCG/DOSE AEPB Inhale 1 puff into the lungs every 12 (twelve) hours.        . furosemide (LASIX) 40 MG tablet Take 40 mg by mouth 2 (two) times daily.        Marland Kitchen GLIPIZIDE PO Take by mouth. 1 po bid       . montelukast (SINGULAIR) 10 MG tablet  Take 10 mg by mouth at bedtime.        . potassium chloride SA (K-DUR,KLOR-CON) 20 MEQ tablet Take 20 mEq by mouth daily.        . ramipril (ALTACE) 10 MG tablet Take 10 mg by mouth daily.        Marland Kitchen spironolactone (ALDACTONE) 25 MG tablet Take 25 mg by mouth daily.          No Known Allergies  History   Social History  . Marital Status: Single    Spouse Name: N/A    Number of Children: 4  . Years of Education: N/A   Occupational History  .     Social History Main Topics  . Smoking status: Never Smoker   . Smokeless tobacco: Not on file  . Alcohol Use: No     former heavy ETOH, quit  . Drug Use: No  . Sexually Active: Not on file   Other Topics Concern  . Not on file   Social History Narrative   disabled    Family History  Problem Relation Age of Onset  . Cancer Father   .  Diabetes      grandparents   Physical Exam: Filed Vitals:   01/08/11 1000  BP: 110/70  Pulse: 60  Height: 5\' 5"  (1.651 m)  Weight: 358 lb (162.388 kg)    GEN- The patient is well appearing but has gained a large amount of weight since last year, alert and oriented x 3 today.   Head- normocephalic, atraumatic Eyes-  Sclera clear, conjunctiva pink Ears- hearing intact Oropharynx- clear Neck- supple, no JVP Lymph- no cervical lymphadenopathy Lungs- Clear to ausculation bilaterally, normal work of breathing Chest- ICD pocket is well healed Heart- Regular rate and rhythm, no murmurs, rubs or gallops, PMI not laterally displaced GI- soft, NT, ND, + BS Extremities- no clubbing, cyanosis, or edema MS- no significant deformity or atrophy Skin- no rash or lesion Psych- euthymic mood, full affect Neuro- strength and sensation are intact

## 2011-01-08 NOTE — Assessment & Plan Note (Signed)
Stable Not volume overloaded on exam Consider coreg.  I will defer this to Dr Elease Hashimoto at this time. No changes

## 2011-01-08 NOTE — Assessment & Plan Note (Signed)
I am very concerned about his 45 lb weight gain over the past year.  We spent a significant amount of time talking about this time.  I have recommended that given his diabetes/ insulin resistance that he avoid carbohydrates and reduce caloric intake.  Regular exercise is also encouraged.

## 2011-01-08 NOTE — Assessment & Plan Note (Signed)
Controlled Normal ICD function See Pace Art report No changes today  We will add a beta blocker if further VT

## 2011-02-18 NOTE — Op Note (Signed)
NAMECARVELL, HOEFFNER               ACCOUNT NO.:  [id number]   MEDICAL RECORD NO.:  [id number]          PATIENT TYPE:  INP   LOCATION:  4712                         FACILITY:  [facility]   PHYSICIAN:  Doylene Canning. Ladona Ridgel, MD    DATE OF BIRTH:  1961/09/09   DATE OF PROCEDURE:  DATE OF DISCHARGE:  06/07/2008                               OPERATIVE REPORT   PRIMARY SURGEON:  Doylene Canning. Ladona Ridgel, MD.   FIRST ASSISTANT:  Hillis Range, MD.   PREPROCEDURE DIAGNOSES:  1. Nonischemic cardiomyopathy.  2. Congestive heart failure.  3. New York Heart Association Class III.   POSTPROCEDURE DIAGNOSES:  1. Nonischemic cardiomyopathy.  2. Congestive heart failure.  3. New York Heart Association Class III.   PROCEDURES:  Defibrillation threshold testing.   DESCRIPTION OF THE PROCEDURE:  An informed written consent was obtained,  and the patient was brought to the electrophysiology lab in the fasting  state.  He was adequately sedated with intravenous medications as  outlined in the nursing report.  Adequate airway and IV support were  assured.  The ventricular fibrillation was then induced with T-shock  through the patient's ICD.  Ventricular fibrillation was induced and was  adequately sensed with a sensitivity of 1.2 millivolts.  The patient was  successfully defibrillated to sinus rhythm with a single 15 joule shock  delivered from the device.  He remained in sinus rhythm thereafter.  There were no early apparent complications.   CONCLUSIONS:  1. DFT less than or equal to 15 joules.  2. No early apparent complications.      Hillis Range, MD   Electronically Signed     ______________________________  Doylene Canning. Ladona Ridgel, MD    JA/MEDQ  D:  06/07/2008  T:  06/08/2008  Job:  [id number]   cc:   [name], M.D.

## 2011-02-18 NOTE — Discharge Summary (Signed)
NAMEPER, BEAGLEY               ACCOUNT NO.:  [id number]   MEDICAL RECORD NO.:  [id number]          PATIENT TYPE:  INP   LOCATION:  4712                         FACILITY:  [facility]   PHYSICIAN:  [name], M.D. DATE OF BIRTH:  1961/01/23   DATE OF ADMISSION:  05/27/2008  DATE OF DISCHARGE:  06/07/2008                               DISCHARGE SUMMARY   DISCHARGE DIAGNOSES:  1. Severe congestive heart failure with a ejection fraction of around      10-20%.  2. Pulmonary hypertension.  3. Massive volume overload - status post diuresis of 34 kg.  4. Ventricular tachycardia - status post automatic implantable      cardioverter-defibrillator placement by Dr. Graciela Husbands.  5. Obesity.  6. Hyperglycemia.   DISCHARGE MEDICATIONS:  1. Protonix 40 mg a day.  2. Altace 10 mg a day.  3. Digoxin 0.25 mg a day.  4. Spironolactone 25 mg a day.  5. Aspirin 81 mg a day.  6. Singulair 10 mg a day.  7. Potassium chloride 20 mg once a day.  8. Carvedilol 12.5 mg p.o. b.i.d.  9. Demadex 20 mg - 2 tablets a day.  10.Colchicine 0.6 mg twice a day as needed.  11.Advair 250 mg/50 mg twice a day as needed.  12.Albuterol HFA 1-2 puffs every 6 hours as needed.   DISPOSITION:  The patient will see Dr. Elease Hashimoto on Friday for a basic  metabolic profile.  We will see him in the office in a week for followup  office visit.  He is to see Dr. Graciela Husbands in the [facility]  in a week or two.  He is to follow up with Dr. Lina Sar for an  outpatient evaluation July 10, 2008, at 9:45 a.m.   HISTORY:  Mr. Chuba is a 49 year old gentleman who was admitted with  massive volume overload and congestive heart failure.  Please see  dictated H&P for further details.   HOSPITAL COURSE:  1. Congestive heart failure.  The patient had echocardiogram, which      revealed a markedly depressed left ventricular systolic function.      He is very volume overload.  We start him on milrinone and start      him on  Lasix 40 mg 3 times a day IV.  He had a very brisk diuresis.      He lost a total of 34 kg.  His BUN started to go up a little bit      yesterday, but otherwise his renal function has been holding      steady.  His BNP got as low as 52 and his discharge BNP is 66.  It      is quite clear that he had at least 34 liters of fluid on him.  He      is doing quite well.  We will get an echocardiogram as an      outpatient, now that he has been fully diuresed.  I suspect that      his left ventricular systolic function and his pulmonary  hypertension probably dramatically increased.  We will continue the      same medications.  We will follow him up as an outpatient.  2. Ventricular tachycardia.  The patient has a markedly depressed left      ventricular systolic function.  He was found to have prolonged      episodes of nonsustained ventricular tachycardia.  Given his      increased risk, we      referred him for AICD placement.  He had a [employer] AICD placed      the day prior to discharge.  He will be followed up for this by Dr.      Graciela Husbands initially and then Dr. Reyes Ivan.  All of his other medical      problems remain stable.           ______________________________  [name], M.D.     PJN/MEDQ  D:  06/07/2008  T:  06/08/2008  Job:  [id number]   cc:   Duke Salvia, MD, [facility]  Hedwig Morton. Juanda Chance, MD

## 2011-02-18 NOTE — Assessment & Plan Note (Signed)
[facility]                         GASTROENTEROLOGY OFFICE NOTE   SAGAR, TENGAN                      MRN:          [id number]  DATE:12/22/2007                            DOB:          1961-02-20    REFERRING PHYSICIAN:  Barbette Hair. Artist Pais, DO   Mr. Ou is a 50 year old gentleman, who was discharged from the  hospital yesterday, after hospitalization for fluid overload, systolic  heart failure, type 2 diabetes, hypertension, obstructive sleep apnea,  morbid obesity.  He had an appointment with me from prior to his  hospitalization, because of episode of rectal bleeding, which occurred  on several occasions.  Patient was seen in 2003 for anemia but, at that  time, his stool was Hemoccult-negative and no GI evaluation was carried  out.  This time, his hemoglobin has been normal at 14, hematocrit 43.3.  There is no family history of colon cancer.  His bowel habits have been  somewhat irregular while in the hospital, but are getting more regular  since his discharge.   MEDICATIONS:  1. Lanoxin 0.25 mg p.o. daily.  2. Altace 10 mg p.o. daily.  3. Potassium supplement 20 mEq daily.  4. Aspirin 325 mg p.o. daily.  5. Glucophage 1000 mg b.i.d.  6. Coreg 6.25 mg b.i.d.  7. Lasix 80 mg p.o. b.i.d.  8. Pantoprazole 40 mg p.o. daily.  9. Metoclopramide 5 mg a.c. and h.s.  10.Ramipril 10 mg p.o. daily.   PAST HISTORY:  Significant for chronic systolic heart failure,  obstructive sleep apnea, pulmonary hypertension, obesity, type 2  diabetes, uncontrolled hypertension.   OPERATIONS:  None.   FAMILY HISTORY:  Positive for diabetes in grandmother.   CLINICAL HISTORY:  Single with four children.  He is on disability.  He  does not smoke.  He used to drink alcohol, but not currently.   REVIEW OF SYSTEMS:  Positive for occasional fever, swelling of the feet,  allergies, sinus trouble, sleeping problems, shortness of breath, muscle  pain.   PHYSICAL  EXAM:  Blood pressure 112/74, pulse 70 and weight 332 pounds.  Patient was alert and oriented, clearly massively overweight.  He did  not appear to be dyspneic.  Sclera is not icteric.  LUNGS:  With decreased breath sounds, no wheezes or rales.  COR:  With rapid S1, S2.  ABDOMEN:  Large, protuberant, but very soft with normoactive bowel  sounds and no tenderness.  I could not appreciate liver edge because of  his size, but I could not feel any masses or hernias.  RECTAL EXAM:  And anoscopic exam today, reveals first-grade internal  hemorrhoids with definite hyperemia, bluish discoloration, but no active  bleeding.  Stool was Hemoccult-positive.  EXTREMITIES:  There was 1 to 2+ edema bilaterally.   IMPRESSION:  1. A 50 year old African-American male with episodic hematochezia,      most likely related to symptomatic hemorrhoids, with heme-positive      stool, with history of anemia.  He has never had a colonoscopy and      I feel that he ought to be further evaluated for colonic polyps or  ischemic colitis, possibly neoplasm.  2. Symptomatic hemorrhoids, which need to be further treated.  3. Chronic systolic hypertension and heart failure, being treated.      Patient currently improved after his hospitalization for diuresis.   PLAN:  1. Patient has been scheduled for colonoscopy in the hospital with      routine colonoscopy prep, modification of his diabetic medications      prior to the prep.  2. Anusol-HC suppositories to be used at night.  3. Booklet on hemorrhoids.  4. Continue all other medications.     Hedwig Morton. Juanda Chance, MD  Electronically Signed    DMB/MedQ  DD: 12/22/2007  DT: 12/22/2007  Job #: [id number]

## 2011-02-18 NOTE — H&P (Signed)
NAMEBRYER, Gary Hutchinson               ACCOUNT NO.:  [id number]   MEDICAL RECORD NO.:  [id number]          PATIENT TYPE:  INP   LOCATION:  1824                         FACILITY:  [facility]   PHYSICIAN:  Bevelyn Buckles. [name], MDDATE OF BIRTH:  1961/01/31   DATE OF ADMISSION:  05/27/2008  DATE OF DISCHARGE:                              HISTORY & PHYSICAL   PRIMARY CARE PHYSICIAN:  Thomos Lemons, DO.   CARDIOLOGIST:  Delane Ginger, MD.   HISTORY OF PRESENT ILLNESS:  Gary Hutchinson is a 50 year old male with a  history of systolic heart failure secondary to nonischemic  cardiomyopathy, hypertension and borderline diabetes, obstructive sleep  apnea, obesity and previous alcohol abuse.   His cardiac history appears to date back to 2005 when he underwent  cardiac catheterization, by Dr. Algie Coffer, for congestive heart failure.  He had normal coronary arteries that time with a markedly elevated wedge  pressure in the mid 40s.  Apparently, his EF was 20-25% by  echocardiogram.  He has been more recently followed by Dr. Elease Hashimoto, he  was admitted in March 2009 with an acute heart failure exacerbation,  echocardiogram that time showed an EF of 40-45%.  LVId at end diastole  was 7.2 cm with moderate mitral regurgitation and moderate tricuspid  regurgitation.   For the past few weeks he has been struggling with fluid overload, he  saw Dr. Elease Hashimoto this Tuesday for possible clearance for gallbladder  surgery.  Dr. Elease Hashimoto said he did not think he was ready as he still had  marked fluid overload, he changed his Lasix from 80 b.i.d. to Demadex 40  b.i.d.  Gary Hutchinson has had a fairly good diuresis with this, he has  decreased 15 pounds but still feels fluid overloaded, he has been  complaining of orthopnea, PND, decreased appetite with nausea.  He has  dyspnea on exertion on only 30 feet.  Has been very fatigued.  He has  not had any chest pain.  He came the emergency room for further  evaluation.   REVIEW OF SYSTEMS:   He denies any focal neurologic symptoms, no  bleeding, no bright red blood per rectum, no melena, he does have some  arthritis pain, he has not had any syncope or presyncope, he has been  compliant with his meds, he has obstructive sleep apnea and wears CPAP.   REVIEW OF SYSTEMS:  As I said, otherwise all systems negative except for  HPI and problem list.   PROBLEM LIST:  1. Congestive heart failure secondary to nonischemic/dilated      cardiomyopathy.      a.     Previous ejection fraction about 25%.  Most recent       echocardiogram March 2009 showed a dilated ventricle, ejection       fraction 40-45% with moderate mitral regurgitation and tricuspid       regurgitation.  2. Normal coronary arteries by catheterization in 2005.  3. Obesity.  4. Borderline diabetes.  5. Obstructive sleep apnea on continuous positive airway pressure.  6. Hypertension.  7. History of alcohol abuse, now quit.   CURRENT  MEDICATIONS:  1. Altace 10 a day.  2. Potassium 28 a day.  3. Singulair 10 a day.  4. Aspirin 325 a day.  5. Coreg 12.5 b.i.d.  6. Demadex 40 b.i.d.  7. Digoxin 0.25 a day.   ALLERGIES:  DILTIAZEM.   SOCIAL HISTORY:  He is on disability, he lives in [place] with his  son.  He denies any tobacco use.  He did formally use alcohol but he has  quit.   FAMILY HISTORY:  Father died of cancer and mother with no known medical  problems.   PHYSICAL EXAMINATION:  He is sitting up in bed, he appears fatigued, no  acute distress.  Blood pressure is 136/72 with a heart rate of 105.  He  is sating 99% on room air.  HEENT:  Normal.  NECK:  Thick.  It is hard to assess his JVD.  Carotids are 2+  bilaterally without any bruits.  There is no lymphadenopathy or  thyromegaly.  CARDIAC:  PMI is markedly displaced.  He is tachycardiac and regular  with a very prominent S3 and a soft systolic ejection murmur at the  apex.  LUNGS:  Clear.  ABDOMEN:  Distended, nontender, no obvious  hepatosplenomegaly, though  the exam is limited due to his size.  No bruits, no masses, decreased  bowel sounds.  EXTREMITIES:  Warm.  There is 1 to 2+ edema bilaterally, no rash, there  is no cyanosis or clubbing.  NEURO:  Alert and oriented x3.  Cranial nerves II-XII are intact.  Moves  all 4 extremities without difficulty.  Affect is flattened.   EKG shows sinus tachycardia at a rate of 108.  Chest x-ray shows marked  cardiomegaly with interstitial prominence, no frank edema.   White count 5.0, hemoglobin is 13.8, platelets are 174, BNP is 1448,  digoxin 0.7, troponin is less than 0.05, BUN is 13, creatinine is 1.3.  Potassium is 4.6, sodium is 136.   ASSESSMENT:  1. Acute on chronic systolic heart failure with class IIIB symptoms.      I suspect this is low output state.  2. Nonischemic cardiomyopathy with an ejection fraction of 40 to 45%      by echocardiogram in March of 2009.  3. Hypertension.  4. History of ethanol abuse.  5. Obstructive sleep apnea, on see continuous positive airway      pressure.  6. Borderline diabetes.   PLAN/DISCUSSION:  I suspect all of Gary Hutchinson's symptoms, including his  nausea, are related to low cardiac output.  We will start IV Lasix and  repeat his echocardiogram and consider a right heart catheterization.  I  would have a very low threshold to start milrinone.  I would also need  to consider a transplant workup, although his weight may preclude.  Another consideration would be a possible CardioMEMS pulmonary artery  sensor for tighter fluid management.   From a medication standpoint, we will give him Lasix 80 mg IV t.i.d.,  cut his Coreg in half, due to his low output state, and add  spironolactone.  I will discuss the case with Dr. Elease Hashimoto.      Bevelyn Buckles. [name], MD  Electronically Signed     DRB/MEDQ  D:  05/27/2008  T:  05/27/2008  Job:  [id number]

## 2011-02-18 NOTE — Discharge Summary (Signed)
Gary Hutchinson, Gary Hutchinson               ACCOUNT NO.:  [id number]   MEDICAL RECORD NO.:  [id number]          PATIENT TYPE:  INP   LOCATION:  2038                         FACILITY:  [facility]   PHYSICIAN:  Raenette Rover. Felicity Coyer, MDDATE OF BIRTH:  [birth year]   DATE OF ADMISSION:  12/18/2007  DATE OF DISCHARGE:  12/21/2007                               DISCHARGE SUMMARY   HISTORY OF PRESENT ILLNESS:  Mr. Schowalter is a 50 year old African  American male with history of systolic heart failure, type 2 diabetes,  hypertension and morbid obesity.  The patient presented to the emergency  room on the day of admission with reports of increasing shortness of  breath in the past few days, with non-productive cough and increased  lower extremity swelling.  The patient also admitted to a vague chest  discomfort at time of admission, associated with nausea and vomiting.  The patient denied any diaphoresis with chest pain, nor any aggravating  or alleviating factors.  The patient also reported abdominal bloating  with frequent nausea and vomiting that has been going on for sometime.  Prior to admission, the patient had been referred to gastroenterology  for similar complaints.  He is scheduled for an appointment with Dr.  Lina Sar on day after discharge.  Evaluation in the ER revealed acute  interstitial edema with stable marked cardiomegaly on chest x-ray.  BNP  was 892, the patient with uncontrolled hypertension in the ER of 229/94.  The patient at that time was admitted for further evaluation and  treatment.   PAST MEDICAL HISTORY:  1. Chronic systolic heart failure with dilated cardiomyopathy.  2. Type 2 diabetes.  3. Hypertension.  4. Obstructive sleep apnea, on CPAP.  5. Morbid obesity.  6. History of EtOH abuse.   COURSE OF HOSPITALIZATION:  1. Acute on-chronic systolic heart failure.  The patient was admitted      to a telemetry floor.  Arrhythmias were ruled out during this      hospitalization.   He was diuresed with IV Lasix.  Admission weight      of 151.9, down to 149.60 kg at time of discharge.  Serial cardiac      enzymes were negative.  The patient's primary care cardiologist,      Dr. Elease Hashimoto was asked to consult during this admission.  The patient      was strongly encouraged to avoid salt in his diet, as well as to      comply with weight loss plan, in addition to a regular exercise.      The patient discharged on home dose Lasix with plan follow up with      Dr. Elease Hashimoto in 1 to 2 weeks.  A 2-D echocardiogram was repeated      during this admission.  A left ventricular ejection fraction found      to be 40% to 45%, with a peak pulmonary artery systolic pressure of      45.  2. Obstructive sleep apnea with pulmonary hypertension.  The patient      to continue CPAP as mentioned above, the patient  desperately needs      to lose weight, in addition to regular exercise.  Nutrition consult      obtained during this hospitalization due to BMI greater than 40.  3. Abdominal bloating with nausea.  Abdominal x-ray performed on March      13 revealed a normal gas bowel gas pattern, with no evidence for      ileus or bowel obstruction.  The patient's symptoms stopped,      secondary to gastric reflux, in addition to gastroparesis in      setting of type 2 diabetes and morbid obesity.  The patient was      started on empiric Reglan and proton pump inhibitor.  Symptoms have      resolved at time of discharge.  He is scheduled to follow up with      Dr. Lina Sar in the office tomorrow.  The appointment was      scheduled by his primary care physician, prior to this admission.  4. Uncontrolled hypertension.  As mentioned above, the patient was      very hypertensive on admission to the ER.  He reports he has been      compliant with meds; however, blood pressure was better maintained      throughout the hospitalization with regular medication      administration and with IV diuresis.   The patient discharged on      continued medications.  5. Type 2 diabetes.  The patient reports on admission that his primary      care physician had asked him to hold his metformin.  Blood sugars      throughout the hospitalization ranged from 180s to 110s.  The      patient instructed to resume metformin at dose he had been taking      prior to arrival, which he states is 250 mg p.o. daily.  He may      need some titration of this medication, which we will defer to      primary care physician.   MEDICATIONS:  At time of discharge:  1. Protonix 40 mg p.o. daily.  2. Reglan 5 mg p.o. q.a.c. and h.s.  3. Coreg 6.25 mg p.o. b.i.d.  4. Altace 10 mg p.o. daily.  5. Digoxin 0.25 mg p.o. daily.  6. ASA 325 mg p.o. daily.  7. Potassium chloride 20 mEq p.o. daily.  8. Lasix 80 mg p.o. b.i.d.  9. Metformin 250 mg p.o. daily.   LAB WORK:  At time of discharge:  BNP from 892 on day of admission to  532 on March 14.  White cell count 5.6, platelet count 178, hemoglobin  13.7, hematocrit 42.8, sodium 136, potassium 4.3, BUN 14, creatinine  1.19, amylase lipase within normal limits on day of admission,  Cardiac  enzymes negative x3.   DISPOSITION:  The patient felt medically stable for discharge home at  this time.  He is instructed follow with Dr. Elease Hashimoto in one week, as well  as Dr. Artist Pais as needed.  As mentioned above, the patient was previously  scheduled appointment with Dr. Lina Sar tomorrow.  The patient asked  to return to the ER for any increasing shortness of breath, abdominal  pain, nausea or vomiting or chest pain.  He is instructed to call Dr.  Elease Hashimoto to schedule a follow-up appointment.  The patient has verified  understanding of discharge instructions.      Cordelia Pen, NP  Valerie A. Felicity Coyer, MD  Electronically Signed    LE/MEDQ  D:  12/21/2007  T:  12/21/2007  Job:  [id number]   cc:   Barbette Hair. Artist Pais, DO  Hedwig Morton. Juanda Chance, MD  [name], M.D.

## 2011-02-18 NOTE — Consult Note (Signed)
Gary Hutchinson, Gary Hutchinson               ACCOUNT NO.:  [id number]   MEDICAL RECORD NO.:  [id number]          PATIENT TYPE:  INP   LOCATION:  2038                         FACILITY:  [facility]   PHYSICIAN:  [name], M.D. DATE OF BIRTH:  22-Nov-1960   DATE OF CONSULTATION:  12/17/2007  DATE OF DISCHARGE:                                 CONSULTATION   REASON FOR CONSULTATION:  Gary Hutchinson is a 50 year old gentleman  with a history of an idiopathic dilated cardiomyopathy, morbid obesity,  hypertension and pulmonary hypertension.  He was last seen in our office  a year ago.  He is admitted to the hospital with acute congestive heart  failure.   Gary Hutchinson is a middle-aged gentleman with a history of an idiopathic  dilated cardiomyopathy.  He has had a heart catheterization in 2005.  It  revealed no significant coronary artery irregularities.  He was found to  have moderate to severe left ventricular dysfunction.  He was also found  to have elevated PA pressures with a PA pressure of between 65 and 70.   Gary Hutchinson has been on medical therapy and had been doing fairly well a year  ago.  Over the past several months, he has gotten sick and has come off  his diet.  He has been eating a lot of salty foods.  He insisted he has  been taking his medications.  He has noted that his medication has not  been working all that well and he has gained a lot of fluid.   He presented now with several weeks of progressive shortness of breath  and weakness.  In the emergency room, he was found to have findings  consistent with congestive heart failure and was admitted for further  evaluation.   He also has a history of sleep apnea in the past.   CURRENT MEDICATIONS:  1. Potassium chloride 20 mEq a day.  2. Furosemide 80 mg twice a day.  3. Ramipril 10 mg once a day.  4. Lanoxin 0.25 mg a day.  5. Aspirin 325 mg a day.  6. Carvedilol 6.25 mg twice a day.  A year ago, he had been on      carvedilol  12.5 mg twice a day.  7. He has also been on metformin in the past.   ALLERGIES:  No known drug allergies.   PAST MEDICAL HISTORY:  1. Idiopathic dilated cardiomyopathy.  He had  normal coronary      arteries by heart catheterization by Dr. Algie Coffer in 2005.  2. Moderate to severe pulmonary hypertension.  3. Morbid obesity.  4. History of diabetes mellitus.   FAMILY HISTORY:  Noncontributory.   SOCIAL HISTORY:  The patient is not working.  He is on Disability.   REVIEW OF SYSTEMS:  Essentially negative except for as noted in the HPI.   PHYSICAL EXAMINATION:  GENERAL:  He is a middle-aged gentleman in no  acute distress.  He is alert and oriented x3 and his mood and affect are  normal.  VITAL SIGNS:  Blood pressure was 115/79 with a heart rate  of 100.  HEENT:  2+ carotids.  There is no bruits, no JVD, no thyromegaly.  LUNGS:  Clear posteriorly.  HEART:  Regular rate and rhythm, S1 and S2, with loud S3.  ABDOMEN:  Obese.  He is distended and has a fluid wave.  EXTREMITIES:  He has 2+ pitting edema.  NEUROLOGIC:  Nonfocal.   LABORATORY DATA AND X-RAY FINDINGS:  BNP is 853.  Hemoglobin is 14,  hematocrit 43%.  Sodium is 137, potassium is 4.1, chloride 104, CO2 is  26, creatinine 1.17, BUN 26.   Chest x-ray reveals stable cardiomegaly.  He has interstitial edema.   IMPRESSION/RECOMMENDATIONS:  1. Congestive heart failure.  The patient is fairly noncompliant.  He      has been eating lots of salt recently.  He clearly is volume      overloaded.  We have an echocardiogram from 2007, which reveals an      ejection fraction of 25-30%.  I agree with getting him another      echocardiogram to see what his left ventricular function is now.  2. Will continue with the IV Lasix.  If we start to see a bump in his      creatinine and still do not have off adequate fluid, we can      consider aquapheresis treatment.  I suspect that he has 10 liters      or perhaps more of excess fluid.  3.  Pulmonary hypertension.  The patient has not been formally assessed      in several years.  He certainly still has severe sleep apnea and      morbid obesity.  In addition, he has got severe LV dysfunction.      All of these factors are contributing to his pulmonary      hypertension.  4.  We have had many long discussions regarding      treatment of this and he is unable to really lose any weight and      stick to a good diet.  I suspect that is his pulmonary hypertension      has worsened.  We will continue with our current plan and will see      him regularly.           ______________________________  [name], M.D.     PJN/MEDQ  D:  12/17/2007  T:  12/19/2007  Job:  [id number]   cc:   [facility]

## 2011-02-18 NOTE — Discharge Summary (Signed)
Gary Hutchinson, Gary Hutchinson               ACCOUNT NO.:  [id number]   MEDICAL RECORD NO.:  [id number]          PATIENT TYPE:  INP   LOCATION:  2038                         FACILITY:  [facility]   PHYSICIAN:  Raenette Rover. Felicity Coyer, MDDATE OF BIRTH:  [birth year]   DATE OF ADMISSION:  12/18/2007  DATE OF DISCHARGE:  12/21/2007                               DISCHARGE SUMMARY   ADDENDUM   DISCHARGE DIAGNOSES:  1. Acute on chronic systolic heart failure.  2. Obstructive sleep apnea with pulmonary hypertension.  3. Abdominal bloating and nausea.  4. Uncontrolled hypertension.  5. Type 2 diabetes.   Please see complete discharge summery done for this hospitalization per  dictation.      Cordelia Pen, NP      Raenette Rover. Felicity Coyer, MD  Electronically Signed    LE/MEDQ  D:  01/13/2008  T:  01/14/2008  Job:  [id number]

## 2011-02-18 NOTE — H&P (Signed)
Gary Hutchinson, Gary Hutchinson               ACCOUNT NO.:  [id number]   MEDICAL RECORD NO.:  [id number]          PATIENT TYPE:  INP   LOCATION:  2038                         FACILITY:  [facility]   PHYSICIAN:  Kela Millin, M.D.DATE OF BIRTH:  03-13-1961   DATE OF ADMISSION:  12/16/2007  DATE OF DISCHARGE:                              HISTORY & PHYSICAL   PRIMARY CARE PHYSICIAN:  Barbette Hair. Artist Pais, DO.   CHIEF COMPLAINT:  Worsening shortness of breath and abdominal pain.   HISTORY OF PRESENT ILLNESS:  The patient is a 50 year old black male  with past medical history significant for congestive heart failure with  dilated cardiomyopathy, diabetes mellitus, hypertension, obstructive  sleep apnea on Z-Pak at home, morbid obesity, and history of alcohol in  the past who presents with the above complaints.  He states that he has  had shortness of breath intermittently for a long time, but in the past  few days the shortness of breath has worsened.  He has also had an  unproductive cough, and his legs swelling has also worsened.  He admits  to PND.  He also states that he has had a vague chest discomfort across  his midsternal area associated with some nausea and vomiting, but he  denies diaphoresis and does not recall any aggravating or alleviating  factors.  He also reports abdominal pain, upper abdomen in location, and  he feels very bloated with nausea and vomiting but no diarrhea.  The  patient reports that this has been going on for some time, and he did  complain about it to his primary care physician and was referred to  gastroenterology for which he has an appointment to see Dr. Lina Sar  in clinic.  He denies diarrhea, dysuria, melena, hematochezia, and no  fevers.   The patient was seen in the ER, and his initial blood pressure was  162/108. His chest x-ray revealed acute interstitial edema with stable  marked cardiomegaly. His BNP was elevated at 892, and his point-of-care  markers  were negative.  He was given IV Lasix and, following that, there  was some improvement in his shortness of breath, and his blood pressure  also improved on recheck at 229/94. He is admitted for further  evaluation and management.  The patient also denies hematemesis.   PAST MEDICAL HISTORY:  1. As above.  2. Questionable history of asthma.   MEDICATIONS:  1. Advair discus 500/50.  2. Albuterol.  3. Altace 10 mg daily.  4. Coreg 6.25 mg b.i.d.  5. Lasix 80 mg b.i.d.  6. Lanoxin 0.25 mg daily.  7. Metformin 250 mg daily.  (He states that his primary care physician      asked him to hold off the metformin, but, after some time, he      restarted it at a lower dose.)  8. Aspirin 325 mg daily.   ALLERGIES:  DILTIAZEM.   SOCIAL HISTORY:  He denies tobacco, former alcohol abuse.   FAMILY HISTORY:  His father is deceased.  He had cancer.   REVIEW OF SYSTEMS:  As per HPI.  PHYSICAL EXAMINATION:  GENERAL:  The patient is a middle-age, morbidly  obese, black male in no respiratory distress with nasal cannula oxygen  on.  VITAL SIGNS:  Blood pressure is 129/94, initially 162/108. Pulse is 94.  Respiratory rate is 20, O2 saturation 99%.  HEENT:  PERRLA. EOMI. Sclerae anicteric. Moist mucous membranes and no  oral exudates.  NECK:  Supple, unable to appreciate any JVD due to his body habitus,.  LUNGS:  Decreased breath sounds at the bases with scattered crackles  bilaterally, no wheezes.  ABDOMEN:  Obese, mild upper abdominal tenderness, no rebound tenderness.  Bowel sounds present, firm.  No masses palpable.  EXTREMITIES:  +2 edema, no cyanosis.  NEUROLOGIC:  Alert and oriented x3.  Cranial nerves II-XII grossly  intact, nonfocal exam.   LABORATORY DATA:  The chest x-ray and brain atretic peptide as per HPI.   His white cell count 5.0, hemoglobin 14 hematocrit 43.3, platelet count  185, neutrophil count of 69%.  Point-of-care markers negative x1.  Sodium is 137, potassium 4.1,  chloride 104, CO2 of 26, glucose 148, BUN  of 14, creatinine 1.17, calcium 8.9, total protein 6.7, total bilirubin  1.7.  His other LFTs are unremarkable.   ASSESSMENT AND PLAN:  1. Congestive heart failure exacerbation.  Will obtain serial cardiac      enzymes, continue diuresis with IV Lasix, also continue Altace and      Lanoxin.  Will probably resume Coreg in the morning with      improvement of acute pulmonary edema.  Will also hold off metformin      for now.  Follow and consult cardiology pending enzymes.  2. Uncontrolled hypertension-malignant hypertension, improved on      recheck following IV Lasix. Will continue outpatient medications as      above.  3. Dilated cardiomyopathy as above.  4. Obstructive sleep apnea. Continue see Pap  5. Diabetes mellitus.  Accu-Cheks, sliding scale coverage. Hold off      metformin for now as above.  6. Abdominal pain.  Will obtain abdominal x-rays, place on proton pump      inhibitor and also obtain serum amylase and lipase.  His LFTs are      unremarkable.  Follow and consider abdominal ultrasound versus CT      scan for further evaluation pending above studies.  Also consider      inpatient GI consultation pending his clinical course. As noted      above, he was scheduled to see Dr. Juanda Chance outpatient.  7. History of asthma.  Continue outpatient medications.      Kela Millin, M.D.  Electronically Signed     ACV/MEDQ  D:  12/18/2007  T:  12/18/2007  Job:  [id number]   cc:   Barbette Hair. Artist Pais, DO

## 2011-02-21 NOTE — Discharge Summary (Signed)
NAME:  Gary Hutchinson, Gary Hutchinson                         ACCOUNT NO.:  [id number]   MEDICAL RECORD NO.:  [id number]                   PATIENT TYPE:  INP   LOCATION:  3741                                 FACILITY:  [facility]   PHYSICIAN:  Ricki Rodriguez, M.D.               DATE OF BIRTH:  1961-06-13   DATE OF ADMISSION:  03/07/2004  DATE OF DISCHARGE:  03/11/2004                                 DISCHARGE SUMMARY   FINAL DIAGNOSES:  1. Congestive heart failure.  2. Dilated cardiomyopathy.  3. Obesity.  4. Chronic alcohol abuse.  5. Constipation.   DISCHARGE MEDICATIONS:  1. Aspirin 325 mg one p.o. daily.  2. Plavix 75 mg one p.o. daily.  3. Toprol XL 25 mg one p.o. daily.  4. Altace 10 mg one p.o. daily.  5. Zithromax 250 mg two on day #1, and then one daily x4 days.  6. Docusate sodium 200 mg one daily.  7. Lasix 80 mg b.i.d.  8. Lanoxin 0.25 mg one daily.  9. Singular 10 mg one daily.  10.      Advair 500/50 one puff daily.  11.      Albuterol meter dosed inhaler 2 puffs q.i.d.  12.      Flonase 0.05 two puffs each nostril as needed.  13.      KCL 20 mEq b.i.d.   ACTIVITY:  As tolerated.   DIET:  Low fat, low salt diet with 1500 cc fluid restriction.   FOLLOWUP:  Dr. Orpah Cobb in two weeks.  The patient is to call for an  appointment.   CONDITION ON DISCHARGE:  Improved.   HISTORY:  This 50 year old black male presented with a two to three week  history of shortness of breath, leg swelling, and chest pressure worsening  with laying down and had a history of heart failure for five years.   The patient has a history of hypertension.  He quit smoking 15 years ago,  and has a history of beer, wine, and whiskey intake.   PHYSICAL EXAMINATION:  VITAL SIGNS:  Temperature 98, pulse 87, respirations  12, blood pressure 124/79, height 5 feet 6 inches, weight 234 pounds.  Oxygen saturation of 98%.  GENERAL:  The patient was alert and oriented x3.  HEENT:  Head:  Normocephalic,  atraumatic.  Eyes:  Manson Passey.  Pupils equal,  round, reactive to light.  Sclerae white.  Conjunctivae pink.  Ears, nose,  and throat:  Mucous membranes are pink and moist.  NECK:  No JVD, no carotid bruits.  LUNGS:  Clear anteriorly.  A few wheezes and crackles.  HEART:  Distant S1 and S2, otherwise unremarkable.  ABDOMEN:  Soft, nondistended.  EXTREMITIES:  Trace edema, right ankle, 1 to 2+ left lower extremity.  NEUROLOGIC:  The patient moves all four extremities.   LABORATORY DATA:  Normal electrolytes, BUN, and creatinine.  Glucose  borderline at 115.  CK-MB,  troponin-I unremarkable.  Hemoglobin 12.4,  hematocrit 37.8.  Normal WBC count, normal platelet count.  EKG normal sinus  rhythm with right atrial enlargement and nonspecific ST-T wave changes.   Chest x-ray revealed cardiomegaly with mild vascular congestion.   A 2-D echocardiogram revealed dilated left ventricle with 20 to 30% ejection  fraction.  Mild mitral valve regurgitation.  Mild pulmonary artery systolic  __________ elevation of 45 mmHg along with moderate LAD __________ function.   Cardiac catheterization showed moderate elevated right-sided pressures with  a significantly decreased cardiac output, improved post milrinone therapy.   HOSPITAL COURSE:  The patient was admitted to telemetry unit.  Myocardial  infarction was ruled out;  however, due to congestive heart failure, dilated  heart and poor left __________ systolic function on echocardiogram, he  underwent right and left heart catheterization.  This showed moderate  elevation of pulmonary artery pressures and poor left ventricular cardiac  output.  This improved with IV milrinone drip by about 40%.  The patient's  coronaries essentially unremarkable.  He was given instruction to abstain  from alcohol and continue fluid restriction.  His medications were adjusted.  He required a Dulcolax suppository and Fleets enema for his constipation.  On March 11, 2004, he was  discharged home in satisfactory condition, again  with emphlysis on abstaining from alcohol intake and continue aspirin,  Plavix, Toprol XL, Altace, Singular, Advair, albuterol, Lasix, Lanoxin,  Flonase, and potassium as directed.  Overall, his condition had improved and  he will be followed by me in two weeks.                                                Ricki Rodriguez, M.D.    ASK/MEDQ  D:  05/02/2004  T:  05/02/2004  Job:  [id number]
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
## 2011-02-21 NOTE — H&P (Signed)
NAME:  CLELL, TRAHAN                         ACCOUNT NO.:  [id number]   MEDICAL RECORD NO.:  [id number]                   PATIENT TYPE:  EMS   LOCATION:  MINO                                 FACILITY:  [facility]   PHYSICIAN:  [name], M.D.              DATE OF BIRTH:  [birth year]   DATE OF ADMISSION:  07/16/2002  DATE OF DISCHARGE:                                HISTORY & PHYSICAL   HISTORY OF PRESENT ILLNESS:  The patient is a 50 year old black gentleman  with a history of morbid obesity, asthma, dilated cardiomyopathy,  hypertension, sleep apnea, and depression, who is admitted with dyspnea and  chest pain.  The patient has had a history of dilated cardiomyopathy for  years.  It is most likely related to obesity or hypertension.  He also has  significant asthma and sleep apnea. He has recently changed insurance and  because of this he ran out of his medicines.  He has only been taking part  of his medicines recently.  The patient presents with lots of chest  tightness and lots of shortness of breath for the past several days.  He was  seen here in the emergency room.  His room air saturations were found to be  97%.  However, he continued to have significant chest fullness and shortness  of breath with standing.  He is admitted for further evaluation.   MEDICATIONS:  1. Altace 10 mg a day.  2. Lanoxin 0.25 mg a day.  3. Lasix 80 mg a day.  4. Potassium chloride 10 mEq a day.  5. Zaroxolyn 5 mg a day.  6. Lexapro 10 mg a day.  7. Advair, Atrovent, and albuterol inhalers as needed.   ALLERGIES:  No known drug allergies.   PAST MEDICAL HISTORY:  1. Core pulmonale.  2. Asthma.  3. Morbid obesity.  4. Sleep apnea.  5. Hypertension.   SOCIAL HISTORY:  The patient is a nonsmoker.   FAMILY HISTORY:  Noncontributory.   REVIEW OF SYSTEMS:  The patient complains of lots of dyspnea and chest  tightness.   PHYSICAL EXAMINATION:  GENERAL: This is a young gentleman in no  acute  distress.  He does continue to complain of some chest pain and tightness.  VITAL SIGNS:  Blood pressure 109/79, heart rate 90.  HEENT:  Normocephalic.  NECK:  No JVD and no thyromegaly.  LUNGS:  Clear.  HEART:  Regular rate, S1 and S2.  ABDOMEN:  Obese.  He has no hepatosplenomegaly.  EXTREMITIES:  He has no clubbing or cyanosis.  He has trace edema.  NEUROLOGY:  Nonfocal.   His EKG reveals normal sinus rhythm with left atrial enlargement. He has  nonspecific ST and T wave abnormalities.  His laboratory data is pending.   His chest x-ray reveals moderate cardiomegaly with no pulmonary edema.   The patient presents with episodes of chest tightness and shortness of  breath.  I suspect that this is because he has run out of his medicines. We  will restart his medicines. We will check an echocardiogram as an  outpatient. We will check cardiac enzymes to make sure that he has not had a  myocardial infarction.  We will set him up with a sleep apnea mask.  All of  his other medical problems remain stable.                                               [name], M.D.    PJN/MEDQ  D:  07/17/2002  T:  07/18/2002  Job:  [id number]   cc:   Raynelle Dick, M.D.  [address]  [place]  [place] [address]  Fax: [phone]

## 2011-02-21 NOTE — Discharge Summary (Signed)
Gary Hutchinson, Gary Hutchinson               ACCOUNT NO.:  [id number]   MEDICAL RECORD NO.:  [id number]          PATIENT TYPE:  INP   LOCATION:  6706                         FACILITY:  [facility]   PHYSICIAN:  Valerie A. Felicity Coyer, MDDATE OF BIRTH:  [birth year]   DATE OF ADMISSION:  06/15/2006  DATE OF DISCHARGE:  06/19/2006                                 DISCHARGE SUMMARY   DISCHARGE DIAGNOSES:  1. Community-acquired pneumonia.  2. Congestive heart failure.  3. Obesity.   HISTORY OF PRESENT ILLNESS:  [name] is 50 year old African-  American male with a known history of CHF, dilated cardiomyopathy,  hypertension, diabetes, and morbid obesity who presented to the emergency  department with complaints of shortness of breath.  He was noted on x-ray to  have a right lower lobe infiltrate and was admitted for further evaluation  and treatment.   PAST MEDICAL HISTORY:  1. CHF, dilated cardiomyopathy.  2. History of alcohol abuse.  3. Obstructive sleep apnea.  Uses CPAP.  4. Hypertension.  5. Diabetes type 2.  6. Morbid obesity.   COURSE OF HOSPITALIZATION:  Problem #1:  Community-acquired pneumonia.  The  patient was admitted and was treated with IV Rocephin and azithromycin.  He  continue to improve clinically.  Antibiotics were then changed to p.o.  Avelox.  His O2 saturation has remained stable and is currently 98% on room  air.   Problem #2:  CHF/dilated cardiomyopathy.  The patient was seen during this  hospitalization by [facility], Dr. Glennon Hamilton.  He was diuresed  with IV Lasix which was later changed to p.o. Lasix at his home dosing.  BNP  was noted to be 1041.  The patient did undergo a 2D echo performed during  this hospitalization which noted a left ventricular ejection fraction of 25%  to 30%.   Problem #3:  Morbid obesity.  The patient was noted to have a current weight  of 330 pounds.  A nutrition consult was obtained during this admission to  assist patient  with weight loss plan.   Also note, patient had complaints of abdominal discomfort during this  admission.  A KUB was performed which was negative.   MEDICATIONS AT DISCHARGE:  1. Aspirin 325 mg once daily.  2. Advair 500/50 one puff twice daily.  3. Coreg 6.25 mg p.o. b.i.d.  4. Altace 10 mg p.o. daily.  5. K-Dur 20 mEq p.o. daily.  6. Digoxin 0.25 mg p.o. daily.  7. Avelox 400 mg p.o. daily for 5 additional days.  8. Lasix 40 mg p.o. b.i.d..  9. Metformin.  Patient is instructed to resume same doses prior to      admission.  10.Albuterol inhaler as needed.   PERTINENT LABORATORIES AT DISCHARGE:  BUN 12, creatinine 1.1, hemoglobin  13.7, hematocrit 41.3.  TSH 0.746.  BNP 1041.  Total cholesterol 140,  triglycerides 74, HDL 32, LDL 93.   DISPOSITION AND PLAN:  Transfer patient to home.   FOLLOWUP:  The patient is instructed to follow up with Dr. Thomos Lemons next  week and to call Dr. Artist Pais or go  to the ER should he develop shortness of  breath, chest pain or fever over 101.     ______________________________  Sandford Craze, NP      Raenette Rover. Felicity Coyer, MD  Electronically Signed    MO/MEDQ  D:  06/19/2006  T:  06/19/2006  Job:  [id number]   cc:   Barbette Hair. Artist Pais, DO

## 2011-02-21 NOTE — Cardiovascular Report (Signed)
NAME:  Gary Hutchinson, Gary Hutchinson                         ACCOUNT NO.:  [id number]   MEDICAL RECORD NO.:  [id number]                   PATIENT TYPE:  INP   LOCATION:  3741                                 FACILITY:  [facility]   PHYSICIAN:  Ricki Rodriguez, M.D.               DATE OF BIRTH:  05-Nov-1960   DATE OF PROCEDURE:  03/08/2004  DATE OF DISCHARGE:                              CARDIAC CATHETERIZATION   PROCEDURES:  1. Right and left heart catheterization.  2. Selective coronary angiography.  3. Cardiac output study before and after IV milrinone drip.   APPROACH:  Right femoral artery using 5 French sheath and right subclavian  vein using 8 French sheath and Swan Ganz catheter.   COMPLICATIONS:  None, but multiple attempts were made for vascular access  including SmartNeedle use and cath lab technician use.   INDICATIONS:  This 50 year old black male with a known history of dilated  cardiomyopathy (alcoholic) had chest pain along with shortness of breath,  increased brain natriuretic peptide and new EKG changes of ischemia along  with hypertension and obesity.   HEMODYNAMIC DATA:  The left ventricular pressure was 116/26, aortic pressure  was 109/80, right ventricular pressure was elevated at 71/27, pulmonary  artery pressure was elevated at 67/45, the wedge pressure was elevated at  47/46, right atrial pressure was elevated at 31/27, oxygen saturation was  99% in the aorta on 4 liters of oxygen and 49% in blood from pulmonary  artery.  Cardiac output before milrinone IV drip showed 3.9 by thermal  dilution technique and 3.4 by FICK method.  Post 0.25 mcg/kg of IV  milrinone, the cardiac output improved to 5.3 and cardiac index improved to  2.1, showing a near 40% improvement.   A left ventriculogram was not done to consider the dye use.  The ejection  fraction was 25-30% by echocardiogram and less than 50 mL of dye was used.   CORONARY ANATOMY:  1. The left main coronary artery was  unremarkable.  2. Left anterior descending coronary artery:  The left anterior descending     coronary artery showed no apparent lesion, however the artery appeared     diffusely narrow or stretched out due to cardiomegaly.  3. Left circumflex coronary artery was unremarkable.  The ramus branch was a     very narrow vessel and obtuse marginal branch was unremarkable.  4. Right coronary artery.  The right coronary artery was dominant and     unremarkable.  It also appeared somewhat stretched out like the left     anterior descending artery and the posterolateral and posterior     descending coronary artery were narrow vessels.   IMPRESSION:  1. Near normal coronaries.  2. Decreased cardiac output, improving with milrinone drip.  3. Elevated right heart pressures and pulmonary artery wedge pressure.   RECOMMENDATIONS:  This patient will be treated medically with ACE  inhibitors, aspirin,  Plavix.  He needs to discontinue alcohol intake and  needs additional lifestyle modification, weight loss, etc.                                               Ricki Rodriguez, M.D.    ASK/MEDQ  D:  03/08/2004  T:  03/09/2004  Job:  [id number]

## 2011-02-21 NOTE — H&P (Signed)
Gary Hutchinson, Gary Hutchinson               ACCOUNT NO.:  [id number]   MEDICAL RECORD NO.:  [id number]          PATIENT TYPE:  INP   LOCATION:  6706                         FACILITY:  [facility]   PHYSICIAN:  Rod Holler, MD     DATE OF BIRTH:  Aug 07, 1961   DATE OF ADMISSION:  06/15/2006  DATE OF DISCHARGE:                                HISTORY & PHYSICAL   CHIEF COMPLAINT:  Shortness of breath.   HISTORY OF PRESENT ILLNESS:  [name] is a 50 year old male with a known  history of congestive heart failure, dilated cardiomyopathy, hypertension,  diabetes, morbid obesity, well-healed presents to the emergency department  with complaints of shortness of breath.  The patient had at least a couple  day history of a cough productive of yellow and brown sputum.  He also has a  1 day history of subjective fevers and chills.  The patient has no known  sick contacts.  The patient also has complaints of constant left-sided chest  pressure over the past day, along with abdominal pressure and abdominal  distention.  He also has complaints of dyspnea on exertion, orthopnea and  paroxysmal nocturnal dyspnea.  The patient had a chest x-ray in the  emergency department, possible right lower lobe infiltrate.  In the  emergency department, blood cultures were drawn.  The patient was also given  azithromycin and Rocephin.  The patient was also given Lasix 40 mg IV in the  emergency department.   PAST MEDICAL HISTORY:  1. Congestive heart failure, dilated cardiomyopathy.  2. History of alcohol abuse.  3. Obstructive sleep apnea.  4. Hypertension.  5. Diabetes mellitus.  6. Morbid obesity.   MEDICATIONS ON ADMISSION:  1. Advair 500/50 twice daily.  2. Albuterol inhaler p.r.n.  3. Altace 10 mg p.o. daily.  4. Coreg 6.25 mg by mouth twice daily.  5. Lasix 40 mg by mouth twice daily.  6. Lanoxin 0.25 mg by mouth daily.  7. Metformin unknown dose.  8. Potassium chloride 20 mEq by mouth daily.  9. Aspirin  325 mg by mouth daily.   ALLERGIES:  NO KNOWN DRUG ALLERGIES.   SOCIAL HISTORY:  The patient is a nonsmoker, is a former alcohol abuser.   FAMILY HISTORY:  Father died with cancer.  Mother with no known medical  problems.   REVIEW OF SYSTEMS:  All systems are reviewed.  Details are negative except  as noted in history of present illness.   PHYSICAL EXAMINATION:  VITAL SIGNS:  Temperature 95.9, heart rate 90,  respiratory rate 20, oxygen saturations 97% on room air, blood pressure  157/102.  GENERAL:  Morbidly obese male, alert and oriented x3, no apparent distress.  HEENT:  Normocephalic and atraumatic.  Pupils equal, round and reactive to  light.  Extraocular movements are intact.  Oropharynx clear, mucous membrane  moist.  Neck supple, no adenopathy, no jvd, no carotid bruits.  CHEST:  Lungs clear to auscultation bilaterally with distant heart breath  sounds throughout.  HEART:  Regular rhythm, normal rate, normal S1 and S2.  No murmurs, rubs or  gallops, 2+  peripheral  pulses.  ABDOMEN:  Soft, obese, nontender, active bowel sounds throughout.  EXTREMITIES:  1+ lower extremity edema without cyanosis or clubbing.  NEUROLOGIC:  No focal deficits.  SKIN:  No rashes.   LABORATORY DATA:  Troponin less than 0.05, CKMB 1.6, digoxin level 0.6,  white blood cell count 5600, hematocrit 41.3, platelet count 165,000.  Sodium 137, potassium 4.1, chloride 105, bicarb 27, BUN 12, creatinine 0.9,  glucose 133, BNP of 1041.  Chest x-ray with possible right lower lobe  infiltrate.  Infiltrate shows normal sinus rhythm, right atrial enlargement,  nonspecific T wave changes.   IMPRESSION AND PLAN:  The patient is a 50 year old male with possible  community acquired pneumonia and congestive heart failure.   PLAN:  1. Admit the patient to telemetry bed.  2. Infectious disease.  Blood cultures drawn in the emergency department.      Continue azithromycin and Rocephin which were started in the  emergency      department.  3. Pulmonary.  Continue home dose of Advair and albuterol.  Oxygen by      nasal cannula to keep oxygen saturations greater than 93%.  4. Cardiovascular.  Serial cardiac enzymes x3, continue home dose of      aspirin, Coreg, Altace, Lanoxin, strict I's and O's, daily weights,      Lasix 40 mg IV twice daily, lipid panel in the morning.  Transthoracic      echocardiogram.  5. Endocrine.  Hemoglobin A1c, thyroid function tests, sliding scale      insulin, hold metformin.  6. Fluids, electrolytes and nutrition. basic metabolic panel along with      magnesium level in the morning, diabetic diet.  7. EKG daily x 3 days.      Rod Holler, MD  Electronically Signed     TRK/MEDQ  D:  06/16/2006  T:  06/16/2006  Job:  [id number]

## 2011-02-21 NOTE — Discharge Summary (Signed)
NAME:  Gary Hutchinson, Gary Hutchinson                         ACCOUNT NO.:  [id number]   MEDICAL RECORD NO.:  [id number]                   PATIENT TYPE:  INP   LOCATION:  2017                                 FACILITY:  [facility]   PHYSICIAN:  [name], M.D.              DATE OF BIRTH:  01-01-1961   DATE OF ADMISSION:  07/16/2002  DATE OF DISCHARGE:  07/18/2002                                 DISCHARGE SUMMARY   DISCHARGE DIAGNOSES:  1. Congestive heart failure.  2. Chest pain.  3. Morbid obesity.  4. Asthma.  5. Sleep apnea.   DISCHARGE MEDICATIONS:  1. Sleep apnea mask as directed by his pulmonologist.  2. Altace 10 mg a day.  3. Lanoxin 0.25 mg a day.  4. Furosemide 80 mg a day.  5. Potassium chloride 20 mEq a day.  6. Albuterol and Atrovent inhalers as needed.  7. Lexapro 10 mg a day.  8. Advair inhaler as needed.   DIET:  The patient is to eat a low-fat, low-salt, low-cholesterol diet.   FOLLOW UP:  He is to see Dr. Elease Hashimoto in one to two weeks.  He is to see his  medical doctor as needed.   HISTORY:  The patient is a 50 year old gentleman who was admitted through  the emergency room with episodes of chest pain and shortness of breath.  He  had run out of his medicines several weeks before and had not been able to  refill them.  He has had some problems with insurance changes and did not  want to go to the doctor and run up a big bill.  He was seen in the  emergency room and was found to have chest pain and mild congestive failure.  He was admitted for further evaluation.   HOSPITAL COURSE:  1. CHEST PAIN:  The patient ruled out for myocardial infarction.  He did not     have any acute EKG changes.  He was restarted on his medications and felt     much better.  The patient did not have any further cardiac symptoms.   1.     ELEVATED LIVER ENZYMES:  The patient was noted to have markedly elevated     liver enzymes on admission.  It is not know whether this was due to  medications or perhaps some underlying hepatitis. We will have him follow     up with his general medical doctor in the next week or so.  He is eating     fairly well, although he does state that he has occasional episodes of     indigestion.  We may need him to see a gastroenterologist.  [name], M.D.    PJN/MEDQ  D:  07/18/2002  T:  07/19/2002  Job:  [id number]   cc:   Raynelle Dick, M.D.  [address]  [place]  [place] [address]  Fax: [phone]   Joni Fears D. Young, M.D.  [address] [place]  [place] [address]  Fax: [phone]

## 2011-02-21 NOTE — Op Note (Signed)
Gary Hutchinson, Gary Hutchinson               ACCOUNT NO.:  [id number]   MEDICAL RECORD NO.:  [id number]          PATIENT TYPE:  AMB   LOCATION:  DAY                          [facility]ACILITY:  [facility]   PHYSICIAN:  Vikki Ports, MDDATE OF BIRTH:  1961/03/20   DATE OF PROCEDURE:  08/27/2005  DATE OF DISCHARGE:                                 OPERATIVE REPORT   PREOPERATIVE DIAGNOSIS:  Epidermoid cyst of the posterior neck.   POSTOPERATIVE DIAGNOSIS:  Epidermoid cyst of the posterior neck.   PROCEDURE:  Excision of neck epidermoid cyst.   SURGEON:  Danna Hefty, MD.   ANESTHESIA:  General.   DESCRIPTION OF PROCEDURE:  The patient was taken to the operating room,  placed in the right lateral decubitus position and given MAC anesthesia. The  posterior neck was prepped and draped in normal sterile fashion. Using 1%  lidocaine local anesthesia, the skin in the posterior neck was anesthetized  over a large area. The neck skin was very thick and woody and an elliptical  incision measuring about 15 cm was made dissecting down using a knife  through  very thick fibrous tissue down to softer tissue. The extensive  fibrous tissue extended in all directions and really was rather extensive  and I did not feel like I could get all the tissue without doing a very very  extensive dissection but did debride as much of the tissue as I could. There  was extensive bleeding from the capsule as well and this was controlled with  coagulation. The skin was closed with interrupted vertical mattress sutures  of #0 Prolene. This was not done water tight as to allow the cavity to  drain. A sterile dressing was applied. The patient tolerated the procedure  well and went to PACU in good condition.      Vikki Ports, MD  Electronically Signed     KRH/MEDQ  D:  08/27/2005  T:  08/27/2005  Job:  [id number]

## 2011-04-06 ENCOUNTER — Emergency Department (HOSPITAL_COMMUNITY): Payer: Medicare Other

## 2011-04-06 ENCOUNTER — Inpatient Hospital Stay (HOSPITAL_COMMUNITY)
Admission: EM | Admit: 2011-04-06 | Discharge: 2011-04-12 | DRG: 292 | Disposition: A | Discharge status: Home / Self Care | Payer: Medicare Other | Attending: Internal Medicine | Admitting: Internal Medicine

## 2011-04-06 DIAGNOSIS — R627 Adult failure to thrive: Secondary | ICD-10-CM | POA: Diagnosis present

## 2011-04-06 DIAGNOSIS — G4733 Obstructive sleep apnea (adult) (pediatric): Secondary | ICD-10-CM | POA: Diagnosis present

## 2011-04-06 DIAGNOSIS — N189 Chronic kidney disease, unspecified: Secondary | ICD-10-CM | POA: Diagnosis present

## 2011-04-06 DIAGNOSIS — F329 Major depressive disorder, single episode, unspecified: Secondary | ICD-10-CM | POA: Diagnosis present

## 2011-04-06 DIAGNOSIS — N179 Acute kidney failure, unspecified: Secondary | ICD-10-CM | POA: Diagnosis present

## 2011-04-06 DIAGNOSIS — I5023 Acute on chronic systolic (congestive) heart failure: Principal | ICD-10-CM | POA: Diagnosis present

## 2011-04-06 DIAGNOSIS — I509 Heart failure, unspecified: Secondary | ICD-10-CM | POA: Diagnosis present

## 2011-04-06 DIAGNOSIS — E870 Hyperosmolality and hypernatremia: Secondary | ICD-10-CM | POA: Diagnosis present

## 2011-04-06 DIAGNOSIS — E119 Type 2 diabetes mellitus without complications: Secondary | ICD-10-CM | POA: Diagnosis present

## 2011-04-06 DIAGNOSIS — I129 Hypertensive chronic kidney disease with stage 1 through stage 4 chronic kidney disease, or unspecified chronic kidney disease: Secondary | ICD-10-CM | POA: Diagnosis present

## 2011-04-06 DIAGNOSIS — R0602 Shortness of breath: Secondary | ICD-10-CM

## 2011-04-06 DIAGNOSIS — Z95810 Presence of automatic (implantable) cardiac defibrillator: Secondary | ICD-10-CM

## 2011-04-06 LAB — CBC
HCT: 44.3 % (ref 39.0–52.0)
MCH: 28.5 pg (ref 26.0–34.0)
MCHC: 33.6 g/dL (ref 30.0–36.0)
MCV: 84.9 fL (ref 78.0–100.0)
Platelets: 215 10*3/uL (ref 150–400)
RDW: 14.1 % (ref 11.5–15.5)
WBC: 7.7 10*3/uL (ref 4.0–10.5)

## 2011-04-06 LAB — COMPREHENSIVE METABOLIC PANEL
AST: 17 U/L (ref 0–37)
Albumin: 4.4 g/dL (ref 3.5–5.2)
Alkaline Phosphatase: 77 U/L (ref 39–117)
Chloride: 92 mEq/L — ABNORMAL LOW (ref 96–112)
Potassium: 5.2 mEq/L — ABNORMAL HIGH (ref 3.5–5.1)
Total Bilirubin: 0.9 mg/dL (ref 0.3–1.2)

## 2011-04-06 LAB — URINALYSIS, ROUTINE W REFLEX MICROSCOPIC
Bilirubin Urine: NEGATIVE
Glucose, UA: NEGATIVE mg/dL
Hgb urine dipstick: NEGATIVE
Ketones, ur: NEGATIVE mg/dL
pH: 7.5 (ref 5.0–8.0)

## 2011-04-06 LAB — DIFFERENTIAL
Eosinophils Absolute: 0.1 10*3/uL (ref 0.0–0.7)
Eosinophils Relative: 1 % (ref 0–5)
Lymphocytes Relative: 12 % (ref 12–46)
Lymphs Abs: 0.9 10*3/uL (ref 0.7–4.0)
Monocytes Absolute: 0.3 10*3/uL (ref 0.1–1.0)
Monocytes Relative: 4 % (ref 3–12)

## 2011-04-06 MED ORDER — IOHEXOL 350 MG/ML SOLN
120.0000 mL | Freq: Once | INTRAVENOUS | Status: AC | PRN
  Administered 2011-04-06: 120 mL via INTRAVENOUS

## 2011-04-07 ENCOUNTER — Inpatient Hospital Stay (HOSPITAL_COMMUNITY): Payer: Medicare Other

## 2011-04-07 DIAGNOSIS — I059 Rheumatic mitral valve disease, unspecified: Secondary | ICD-10-CM

## 2011-04-07 DIAGNOSIS — I5023 Acute on chronic systolic (congestive) heart failure: Secondary | ICD-10-CM

## 2011-04-07 LAB — TSH: TSH: 0.491 u[IU]/mL (ref 0.350–4.500)

## 2011-04-07 LAB — COMPREHENSIVE METABOLIC PANEL
AST: 15 U/L (ref 0–37)
CO2: 31 mEq/L (ref 19–32)
Calcium: 10.3 mg/dL (ref 8.4–10.5)
Creatinine, Ser: 1.23 mg/dL (ref 0.50–1.35)
GFR calc Af Amer: >60 mL/min (ref >60)
GFR calc non Af Amer: >60 mL/min (ref >60)

## 2011-04-07 LAB — CARDIAC PANEL(CRET KIN+CKTOT+MB+TROPI)
CK, MB: 2.3 ng/mL (ref 0.3–4.0)
Relative Index: 2 (ref 0.0–2.5)
Total CK: 116 U/L (ref 7–232)

## 2011-04-07 LAB — GLUCOSE, CAPILLARY: Glucose-Capillary: 156 mg/dL — ABNORMAL HIGH (ref 70–99)

## 2011-04-07 LAB — CBC
Hemoglobin: 14.8 g/dL (ref 13.0–17.0)
MCHC: 34 g/dL (ref 30.0–36.0)
RBC: 5.15 MIL/uL (ref 4.22–5.81)

## 2011-04-07 LAB — DIGOXIN LEVEL: Digoxin Level: 0.7 ng/mL — ABNORMAL LOW (ref 0.8–2.0)

## 2011-04-07 LAB — PRO B NATRIURETIC PEPTIDE: Pro B Natriuretic peptide (BNP): 1213 pg/mL — ABNORMAL HIGH (ref 0–125)

## 2011-04-08 LAB — BASIC METABOLIC PANEL
Chloride: 89 mEq/L — ABNORMAL LOW (ref 96–112)
GFR calc Af Amer: 59 mL/min — ABNORMAL LOW (ref >60)
GFR calc non Af Amer: 49 mL/min — ABNORMAL LOW (ref >60)
Potassium: 4.1 mEq/L (ref 3.5–5.1)
Sodium: 133 mEq/L — ABNORMAL LOW (ref 135–145)

## 2011-04-08 LAB — GLUCOSE, CAPILLARY
Glucose-Capillary: 160 mg/dL — ABNORMAL HIGH (ref 70–99)
Glucose-Capillary: 147 mg/dL — ABNORMAL HIGH (ref 70–99)
Glucose-Capillary: 186 mg/dL — ABNORMAL HIGH (ref 70–99)

## 2011-04-08 LAB — CBC
MCHC: 34.3 g/dL (ref 30.0–36.0)
Platelets: 224 10*3/uL (ref 150–400)
RDW: 14.2 % (ref 11.5–15.5)
WBC: 8.1 10*3/uL (ref 4.0–10.5)

## 2011-04-09 LAB — BASIC METABOLIC PANEL
Chloride: 85 mEq/L — ABNORMAL LOW (ref 96–112)
GFR calc Af Amer: 54 mL/min — ABNORMAL LOW (ref >60)
GFR calc non Af Amer: 44 mL/min — ABNORMAL LOW (ref >60)
Potassium: 3.9 mEq/L (ref 3.5–5.1)
Sodium: 129 mEq/L — ABNORMAL LOW (ref 135–145)

## 2011-04-09 LAB — CBC
Platelets: 220 10*3/uL (ref 150–400)
RBC: 5.59 MIL/uL (ref 4.22–5.81)
RDW: 14.2 % (ref 11.5–15.5)
WBC: 9.3 10*3/uL (ref 4.0–10.5)

## 2011-04-09 LAB — GLUCOSE, CAPILLARY: Glucose-Capillary: 161 mg/dL — ABNORMAL HIGH (ref 70–99)

## 2011-04-09 LAB — LIPID PANEL
Cholesterol: 181 mg/dL (ref 0–200)
HDL: 37 mg/dL — ABNORMAL LOW (ref >39)
Triglycerides: 137 mg/dL (ref <150)

## 2011-04-10 ENCOUNTER — Ambulatory Visit (INDEPENDENT_AMBULATORY_CARE_PROVIDER_SITE_OTHER): Payer: Medicare Other | Admitting: *Deleted

## 2011-04-10 ENCOUNTER — Inpatient Hospital Stay (HOSPITAL_COMMUNITY): Payer: Medicare Other

## 2011-04-10 DIAGNOSIS — I509 Heart failure, unspecified: Secondary | ICD-10-CM

## 2011-04-10 DIAGNOSIS — I428 Other cardiomyopathies: Secondary | ICD-10-CM

## 2011-04-10 DIAGNOSIS — I5022 Chronic systolic (congestive) heart failure: Secondary | ICD-10-CM

## 2011-04-10 DIAGNOSIS — I472 Ventricular tachycardia: Secondary | ICD-10-CM

## 2011-04-10 LAB — GLUCOSE, CAPILLARY
Glucose-Capillary: 144 mg/dL — ABNORMAL HIGH (ref 70–99)
Glucose-Capillary: 153 mg/dL — ABNORMAL HIGH (ref 70–99)
Glucose-Capillary: 156 mg/dL — ABNORMAL HIGH (ref 70–99)

## 2011-04-10 LAB — UIFE/LIGHT CHAINS/TP QN, 24-HR UR
Alpha 1, Urine: DETECTED — AB
Free Kappa Lt Chains,Ur: 0.56 mg/dL (ref 0.14–2.42)
Free Lambda Lt Chains,Ur: 0.09 mg/dL (ref 0.02–0.67)
Gamma Globulin, Urine: NOT DETECTED
Total Protein, Urine: 2.6 mg/dL

## 2011-04-10 LAB — DIFFERENTIAL
Basophils Absolute: 0 10*3/uL (ref 0.0–0.1)
Basophils Relative: 0 % (ref 0–1)
Eosinophils Absolute: 0.1 10*3/uL (ref 0.0–0.7)
Eosinophils Relative: 1 % (ref 0–5)
Lymphocytes Relative: 22 % (ref 12–46)
Monocytes Absolute: 0.9 10*3/uL (ref 0.1–1.0)

## 2011-04-10 LAB — PROTEIN ELECTROPH W RFLX QUANT IMMUNOGLOBULINS
Alpha-1-Globulin: 4.2 % (ref 2.9–4.9)
Alpha-2-Globulin: 12.5 % — ABNORMAL HIGH (ref 7.1–11.8)
Beta 2: 6.1 % (ref 3.2–6.5)
Gamma Globulin: 20.6 % — ABNORMAL HIGH (ref 11.1–18.8)

## 2011-04-10 LAB — CBC
HCT: 47.5 % (ref 39.0–52.0)
MCHC: 34.5 g/dL (ref 30.0–36.0)
RDW: 14.2 % (ref 11.5–15.5)

## 2011-04-10 LAB — BASIC METABOLIC PANEL
CO2: 29 mEq/L (ref 19–32)
Calcium: 9.9 mg/dL (ref 8.4–10.5)
Creatinine, Ser: 1.81 mg/dL — ABNORMAL HIGH (ref 0.50–1.35)
Glucose, Bld: 127 mg/dL — ABNORMAL HIGH (ref 70–99)

## 2011-04-11 LAB — GLUCOSE, CAPILLARY
Glucose-Capillary: 159 mg/dL — ABNORMAL HIGH (ref 70–99)
Glucose-Capillary: 165 mg/dL — ABNORMAL HIGH (ref 70–99)

## 2011-04-11 LAB — BASIC METABOLIC PANEL
GFR calc Af Amer: >60 mL/min (ref >60)
GFR calc non Af Amer: 51 mL/min — ABNORMAL LOW (ref >60)
Potassium: 4 mEq/L (ref 3.5–5.1)
Sodium: 126 mEq/L — ABNORMAL LOW (ref 135–145)

## 2011-04-11 LAB — CBC
Platelets: 202 10*3/uL (ref 150–400)
RBC: 5.47 MIL/uL (ref 4.22–5.81)
WBC: 8.8 10*3/uL (ref 4.0–10.5)

## 2011-04-12 DIAGNOSIS — I428 Other cardiomyopathies: Secondary | ICD-10-CM

## 2011-04-12 LAB — BASIC METABOLIC PANEL
CO2: 30 mEq/L (ref 19–32)
Calcium: 9.3 mg/dL (ref 8.4–10.5)
Creatinine, Ser: 1.53 mg/dL — ABNORMAL HIGH (ref 0.50–1.35)
GFR calc Af Amer: 59 mL/min — ABNORMAL LOW (ref >60)

## 2011-04-12 LAB — CBC
MCH: 29.3 pg (ref 26.0–34.0)
MCHC: 35.5 g/dL (ref 30.0–36.0)
MCV: 82.6 fL (ref 78.0–100.0)
Platelets: 179 10*3/uL (ref 150–400)
RDW: 13.8 % (ref 11.5–15.5)

## 2011-04-12 LAB — PRO B NATRIURETIC PEPTIDE: Pro B Natriuretic peptide (BNP): 318.7 pg/mL — ABNORMAL HIGH (ref 0–125)

## 2011-04-16 ENCOUNTER — Encounter: Payer: Self-pay | Admitting: *Deleted

## 2011-04-18 NOTE — Progress Notes (Signed)
icd remote w/icm  

## 2011-05-03 NOTE — H&P (Signed)
Gary Hutchinson, Gary Hutchinson               ACCOUNT NO.:  [id number]  MEDICAL RECORD NO.:  [id number]  LOCATION:  2010                         FACILITY:  [facility]  PHYSICIAN:  [name], MD       DATE OF BIRTH:  1961-05-19  DATE OF ADMISSION:  04/06/2011 DATE OF DISCHARGE:                             HISTORY & PHYSICAL   PRIMARY CARE PROVIDER:  [name], MD  CARDIOLOGIST:  [name], MD  PULMONOLOGIST:  Barbaraann Share, MD, [facility]  CHIEF COMPLAINT:  Congestive heart failure exacerbation, dyspnea on exertion, weight gain, swollen, nausea.  HISTORY OF PRESENT ILLNESS:  This is a 50 year old male with type 2 diabetes, congestive heart failure with an EF of 10%-20% based on dilated cardiomyopathy and a negative heart cath, history of V-tach status post AICD, pulmonary hypertension, obstructive sleep apnea on CPAP, morbid obesity, and hypertension, here for the symptoms listed in the chief complaint.  He saw me on April 04, 2011, and I added Zaroxolyn to his Lasix and his home Aldactone dose and was going to bring him back on Monday or Tuesday of next week with the idea of if he was not getting better, he probably need admission to the hospital.  I got labs and BNP and everything looked pretty good, BNP was only 200.  Chest x-ray showed cardiomegaly and pulmonary edema.  I called Dr. Elease Hashimoto after the visit and said that if he did not diurese over the weekend, we would talk about admitting him, placing him back on milrinone or Aquapheresis as he had last time he was in the hospital in 2009.  Of note, he has gained 15 pounds in the last 6 months.  He is somewhere between 50 and potentially 100 pounds over his dry weight, hard to tell.  He is tired and worn out. He has got abdominal discomfort, bloating, and sick with nausea without vomiting and no fevers.  Current symptoms include dyspnea on exertion, abdominal pressure.  His belly button has gone from an innie to an outie.  He has  got no chest pain, no palpitations.  He has got no resting shortness of breath.  He reports not feeling as bad as the admission from 2009.  ED workup is compatible for congestive heart failure.  CT pulmonary angiogram is negative for PE.  Aspirin, Lasix, Zofran have been given.  PAST MEDICAL HISTORY: 1. Type 2 diabetes. 2. Hypertension. 3. Congestive heart failure with EF 10%-20%. 4. Dilated cardiomyopathy. 5. Pulmonary hypertension. 6. V-tach status post AICD. 7. GERD. 8. Gout. 9. Allergic rhinitis. 10.Hyperlipidemia, 11.Obesity. 12.Obstructive sleep apnea with CPAP. 13.Asthma. 14.Hypertension. 15.Osteoarthritis. 16.Back pain, has seen Dr. Venetia Maxon. 17.Coronary catheterization in 2005, was normal. 18.History of alcohol abuse, has quit. 19.Status post epidermoid cyst removal from his neck.  ALLERGIES:  DILTIAZEM.  Medication list includes: 1. Lasix 20 mg 2 tablets twice daily. 2. Zaroxolyn 2.5 mg p.o. daily for 3 days and 1 every other day. 3. Advair Diskus 250/50 one puff twice daily. 4. Allegra 180 daily. 5. Ramipril 10 mg p.o. daily. 6. Aspirin 325 daily. 7. Colcrys 0.6 b.i.d. p.r.n. 8. Coreg 12.5 mg 1 tablet twice daily. 9. Flonase 50  mcg 2 sprays bilaterally at bedtime as needed. 10.Glipizide/metformin 2.5/250 one tablet twice daily. 11.Klor-Con 20 mEq p.o. daily. 12.Lanoxin 0.25 mg 1 tablet daily. 13.Bactrim 25 mg p.o. daily. 14.Aldactone 25 mg p.o. daily. 15.Simvastatin 40 mg p.o. daily. 16.ProAir 2 puffs b.i.d. p.r.n. 17.Allopurinol 100 mg daily, is on hold. 18.Triamcinolone acetonide 0.1% cream apply b.i.d. p.r.n. to affected     areas. 19.One Touch Ultra Test Strips. 20.Ambien 5 mg p.o. at bedtime p.r.n.  SOCIAL HISTORY:  He is single, 4 children.  Disabled.  Quit tobacco in 1992.  He has been off alcohol.  FAMILY HISTORY:  Hypertension, rheumatoid arthritis, and asthma.  REVIEW OF SYSTEMS:  Weight gain, abdominal discomfort, abdominal bloating,  nausea.  No bowel or bladder issues, dyspnea on exertion, swollen.  No fevers.  Full review of systems obtained and otherwise negative.  PHYSICAL EXAMINATION:  VITAL SIGNS:  Temperature 97.4, heart rate 73, blood pressure 118/72, respiratory rate 20, sating 100% on room air. GENERAL:  Alert and oriented, but uncomfortable due to his abdomen and nausea. HEENT:  Oropharynx is moist.  PERRL. NECK:  Thick.  Carotids, negative bruit. CARDIAC:  Regular with a right AICD noted. PULMONARY:  Distant mild rales noted. ABDOMEN:  Diffusely mildly tender, obese.  Umbilical hernia noted.  No rebound, no guarding.  Bowel sounds are positive. EXTREMITIES:  No clubbing, no cyanosis, no edema.  ANCILLARY DATA:  Glucose 194.  His white count 7.7, hemoglobin 14.9, platelet count 215.  D-dimer is 0.67.  Sodium 133, potassium 5.2, chloride 92, bicarb 30, BUN 23, creatinine 1.19, glucose 195, calcium is 10.  Protein is 9.0, albumin 4.4, BNP 547.  Chest x-ray; cardiomyopathy, cardiomegaly, and congestive heart failure.  CT pulmonary angiogram is negative for PE.  Upper abdominal part of the scan was also negative. Vascular congestion was noted.  ASSESSMENT:  This is a 50 year old male with congestive heart failure and an ejection fraction of 10%-20%, being admitted for congestive heart failure exacerbation, presumed ascites, volume overload, abdominal bloating and distention.  PLAN: 1. Admit. 2. Aggressive diuresis. 3. We will consult Cardiology.  I have already talked with Dr. Elease Hashimoto     on Friday and just talked with Dr. Eldridge Dace.  They will come by and     see him.  They mentioned milrinone and Aquapheresis in the past and     he will be evaluated for that and may get seen by Dr. Gala Romney if     available and appropriate. 4. We will control diabetes and hypertension.  Insulin sliding scale     has been ordered.  We will hold Glucophage in the fact that he has     gotten a big contrast bolus. 5.  We will check abdominal ultrasound.  With the abdominal ultrasound,     we will evaluate the kidneys, the liver, and see if there is     ascites and whether paracentesis may be in order. 6. We will check SPEP/UPEP with the protein being elevated. 7. We will check digoxin level and uric acid levels. 8. Home medications where they are appropriate. 9. CPAP for obstructive sleep apnea. 10.Overnight oximetry and see if he needs oxygen. 11.DVT prophylaxis has been ordered. 12.If this patient is appropriate for Cardiology Service and all his     service stuff turns out negative, I would be happy if Cardiology     took him on their service.  For now, one of the biggest important     aspects of his care  is just getting him admitted and taken care of. 13.He does have zero lower extremity edema. 14.Diabetes mellitus.  Outpatient hemoglobin A1c was 7.5, it was just     checked the other day, we will not recheck it right now.  His last     microalbuminuria was 9.6 and negative.  His creatinine clearance is     estimated to be 64 mL per minute.  His last LDL was 111. 15.Nutrition consult has been ordered to help and try to lose weight.     [name], MD     JMR/MEDQ  D:  04/06/2011  T:  04/07/2011  Job:  [id number]  cc:   [name], M.D. Barbaraann Share, MD,FCCP  Electronically Signed by Creola Corn MD on 05/03/2011 08:21:03 PM

## 2011-05-07 NOTE — Consult Note (Signed)
[name], GIST NO.:  [id number]  MEDICAL RECORD NO.:  [id number]  LOCATION:  2010                         FACILITY:  [facility]  PHYSICIAN:  Corky Crafts, MDDATE OF BIRTH:  Feb 27, 1961  DATE OF CONSULTATION: DATE OF DISCHARGE:                                CONSULTATION   PRIMARY CARE PHYSICIAN:  [name], MD  PRIMARY CARDIOLOGIST:  [name], MD.  REASON FOR CONSULTATION:  Shortness of breath with history of heart failure.  HISTORY OF PRESENT ILLNESS:  The patient is a 50 year old man with a known nonischemic cardiomyopathy.  His ejection fraction was between 10 and 20% in 2009.  He was also found to have pulmonary hypertension in the past.  He presents with worsening dyspnea on exertion and weight gain over the past few months.  His oral diuretics have been increased but this has really not helped.  His abdomen has become more distended over time.  He has no peripheral edema.  His breathing has gotten worse. He had a chest x-ray done in the emergency room showing vascular congestion.  Back in 2009 he had a diuresis of 34 kg while on milrinone. It does not appear that he has been hospitalized since that time.  In the emergency room, he had a positive D-dimer and subsequently had chest CT which showed no evidence of pulmonary embolus.  PAST MEDICAL HISTORY: 1. Nonischemic cardiomyopathy. 2. Type 2 diabetes. 3. Obesity. 4. Pulmonary hypertension. 5. Hyperlipidemia. 6. CPAP.  PAST SURGICAL HISTORY:  AICD implantation, epidermoid cyst removal.  FAMILY HISTORY:  No early coronary artery disease.  SOCIAL HISTORY:  He does not smoke.  He quit using alcohol.  HOME MEDICATIONS: 1. Allegra. 2. Advair. 3. Ramipril 10 mg daily. 4. Aspirin 325 mg daily. 5. Colchicine p.r.n. 6. Coreg 12.5 mg b.i.d. 7. Flonase. 8. Glipizide/metformin. 9. Potassium. 10.Lanoxin 0.25 mg daily. 11.Lasix 40 mg b.i.d. 12.Spironolactone 25 mg  daily. 13.Simvastatin 40 mg daily. 14.ProAir. 15.Allopurinol. 16.Triamcinolone. 17.Ambien. 18.Zaroxolyn was recently added.  ALLERGIES:  DILTIAZEM.  REVIEW OF SYSTEMS:  Significant for abdominal distention and shortness of breath.  No leg swelling.  No bleeding problems.  No chest pain.  No focal weakness.  All other systems negative.  PHYSICAL EXAMINATION:  VITAL SIGNS:  Blood pressure 118/72, heart rate 72. GENERAL:  He is awake and alert in no apparent distress. HEAD:  Normocephalic, atraumatic. EYES:  Extraocular movements intact. NECK:  No JVD. CARDIOVASCULAR:  Regular rate and rhythm, S1, S2. LUNGS:  Bibasilar crackles. ABDOMEN:  Significantly distended.  No palpable fluid wave. EXTREMITIES:  Showed no edema. NEURO:  No focal motor or sensory deficits. SKIN:  No rash. BACK:  No kyphosis. PSYCH:  Normal mood and affect.  LABORATORY DATA:  BNP 547, creatinine 1.1.  D-dimer 0.67, hemoglobin 14.9.  CT angiogram showed no pulmonary embolism.  Chest x-ray showed cardiomegaly with vascular congestion.  ASSESSMENT/PLAN:  Acute on chronic systolic heart failure.  PLAN: 1. We will try IV diuretics tonight.  He likely has large amount of     fluid and I do not think diuretics alone will be sufficient.  We     would discuss with Dr.  [name] in the morning regarding possible     aqua freezes versus inotrope's.  The inotrope's apparently worked     in the past. 2. Continue ACE inhibitor.  At this time we will continue Coreg for     now but this potentially may need to be stopped if on further     evaluation his cardiac output is significantly decreased. 3. Hyperlipidemia.  Continue simvastatin.     Corky Crafts, MD     JSV/MEDQ  D:  04/06/2011  T:  04/07/2011  Job:  [id number]  cc:   Bevelyn Buckles. [name], MD  Electronically Signed by Lance Muss MD on 05/07/2011 01:15:26 PM

## 2011-05-19 ENCOUNTER — Telehealth: Payer: Self-pay | Admitting: Internal Medicine

## 2011-05-19 NOTE — Telephone Encounter (Signed)
Refill on test strips denied. Patient was last seen in 2009. Office visit is needed

## 2011-05-19 NOTE — Telephone Encounter (Signed)
Refill- one touch ultra test strips. Test blood sugar two times a day as needed. Qty 100. Last fill 1.23.12

## 2011-05-21 ENCOUNTER — Other Ambulatory Visit: Payer: Self-pay | Admitting: *Deleted

## 2011-05-21 DIAGNOSIS — E7800 Pure hypercholesterolemia, unspecified: Secondary | ICD-10-CM

## 2011-06-30 LAB — POCT CARDIAC MARKERS
CKMB, poc: 1.2
Myoglobin, poc: 75.1
Operator id: 257131
Troponin i, poc: <0.05

## 2011-06-30 LAB — CARDIAC PANEL(CRET KIN+CKTOT+MB+TROPI)
CK, MB: 2.9
Relative Index: 1.5
Total CK: 179
Total CK: 195
Troponin I: 0.04
Troponin I: 0.05

## 2011-06-30 LAB — BASIC METABOLIC PANEL
CO2: 30
Calcium: 8.9
Calcium: 9.1
Calcium: 9.1
Chloride: 99
Creatinine, Ser: 1.35
GFR calc Af Amer: >60
GFR calc Af Amer: >60
GFR calc non Af Amer: >60
GFR calc non Af Amer: >60
Glucose, Bld: 96
Potassium: 4.3
Potassium: 4.3
Sodium: 136
Sodium: 138

## 2011-06-30 LAB — CBC
Hemoglobin: 13.7
MCHC: 32.3
MCHC: 32.3
MCHC: 32.5
MCV: 85.9
MCV: 86.1
Platelets: 180
RBC: 5.04
RDW: 16.4 — ABNORMAL HIGH
RDW: 16.7 — ABNORMAL HIGH

## 2011-06-30 LAB — COMPREHENSIVE METABOLIC PANEL
AST: 35
CO2: 26
Calcium: 8.9
Creatinine, Ser: 1.17
GFR calc Af Amer: >60
GFR calc non Af Amer: >60

## 2011-06-30 LAB — DIFFERENTIAL
Eosinophils Relative: 1
Lymphocytes Relative: 20
Lymphs Abs: 1
Neutro Abs: 3.5
Neutrophils Relative %: 69

## 2011-06-30 LAB — LIPASE, BLOOD: Lipase: 16

## 2011-06-30 LAB — B-NATRIURETIC PEPTIDE (CONVERTED LAB)
Pro B Natriuretic peptide (BNP): 853 — ABNORMAL HIGH
Pro B Natriuretic peptide (BNP): 622 — ABNORMAL HIGH

## 2011-07-01 ENCOUNTER — Encounter: Payer: Self-pay | Admitting: Cardiovascular Disease

## 2011-07-09 LAB — GLUCOSE, CAPILLARY
Glucose-Capillary: 106 — ABNORMAL HIGH
Glucose-Capillary: 113 — ABNORMAL HIGH
Glucose-Capillary: 128 — ABNORMAL HIGH
Glucose-Capillary: 133 — ABNORMAL HIGH
Glucose-Capillary: 135 — ABNORMAL HIGH
Glucose-Capillary: 137 — ABNORMAL HIGH
Glucose-Capillary: 164 — ABNORMAL HIGH
Glucose-Capillary: 99

## 2011-07-09 LAB — BASIC METABOLIC PANEL
BUN: 29 — ABNORMAL HIGH
CO2: 23
Calcium: 9.6
Chloride: 91 — ABNORMAL LOW
Chloride: 94 — ABNORMAL LOW
Creatinine, Ser: 1.13
GFR calc Af Amer: >60
GFR calc Af Amer: >60
GFR calc non Af Amer: >60
GFR calc non Af Amer: >60
Glucose, Bld: 100 — ABNORMAL HIGH
Potassium: 4.9
Potassium: 5.1
Sodium: 128 — ABNORMAL LOW

## 2011-07-09 LAB — B-NATRIURETIC PEPTIDE (CONVERTED LAB)
Pro B Natriuretic peptide (BNP): 52
Pro B Natriuretic peptide (BNP): 66

## 2011-07-10 ENCOUNTER — Encounter: Payer: Self-pay | Admitting: Internal Medicine

## 2011-07-10 ENCOUNTER — Ambulatory Visit (INDEPENDENT_AMBULATORY_CARE_PROVIDER_SITE_OTHER): Payer: Medicare Other | Admitting: *Deleted

## 2011-07-10 ENCOUNTER — Other Ambulatory Visit: Payer: Self-pay

## 2011-07-10 ENCOUNTER — Emergency Department (HOSPITAL_COMMUNITY): Payer: Medicare Other

## 2011-07-10 ENCOUNTER — Inpatient Hospital Stay (HOSPITAL_COMMUNITY)
Admission: EM | Admit: 2011-07-10 | Discharge: 2011-07-18 | DRG: 292 | Disposition: A | Discharge status: Home / Self Care | Payer: Medicare Other | Attending: Internal Medicine | Admitting: Internal Medicine

## 2011-07-10 DIAGNOSIS — I509 Heart failure, unspecified: Secondary | ICD-10-CM

## 2011-07-10 DIAGNOSIS — J984 Other disorders of lung: Secondary | ICD-10-CM | POA: Diagnosis present

## 2011-07-10 DIAGNOSIS — I428 Other cardiomyopathies: Secondary | ICD-10-CM

## 2011-07-10 DIAGNOSIS — I5023 Acute on chronic systolic (congestive) heart failure: Principal | ICD-10-CM | POA: Diagnosis present

## 2011-07-10 DIAGNOSIS — E785 Hyperlipidemia, unspecified: Secondary | ICD-10-CM | POA: Diagnosis present

## 2011-07-10 DIAGNOSIS — I129 Hypertensive chronic kidney disease with stage 1 through stage 4 chronic kidney disease, or unspecified chronic kidney disease: Secondary | ICD-10-CM | POA: Diagnosis present

## 2011-07-10 DIAGNOSIS — I5022 Chronic systolic (congestive) heart failure: Secondary | ICD-10-CM

## 2011-07-10 DIAGNOSIS — Z6841 Body Mass Index (BMI) 40.0 and over, adult: Secondary | ICD-10-CM

## 2011-07-10 DIAGNOSIS — I2789 Other specified pulmonary heart diseases: Secondary | ICD-10-CM | POA: Diagnosis present

## 2011-07-10 DIAGNOSIS — Z888 Allergy status to other drugs, medicaments and biological substances status: Secondary | ICD-10-CM

## 2011-07-10 DIAGNOSIS — N183 Chronic kidney disease, stage 3 unspecified: Secondary | ICD-10-CM | POA: Diagnosis present

## 2011-07-10 DIAGNOSIS — Z87891 Personal history of nicotine dependence: Secondary | ICD-10-CM

## 2011-07-10 DIAGNOSIS — Z7982 Long term (current) use of aspirin: Secondary | ICD-10-CM

## 2011-07-10 DIAGNOSIS — K219 Gastro-esophageal reflux disease without esophagitis: Secondary | ICD-10-CM | POA: Diagnosis present

## 2011-07-10 DIAGNOSIS — M109 Gout, unspecified: Secondary | ICD-10-CM | POA: Diagnosis present

## 2011-07-10 DIAGNOSIS — E119 Type 2 diabetes mellitus without complications: Secondary | ICD-10-CM | POA: Diagnosis present

## 2011-07-10 DIAGNOSIS — Z79899 Other long term (current) drug therapy: Secondary | ICD-10-CM

## 2011-07-10 DIAGNOSIS — Z95810 Presence of automatic (implantable) cardiac defibrillator: Secondary | ICD-10-CM

## 2011-07-10 DIAGNOSIS — G4733 Obstructive sleep apnea (adult) (pediatric): Secondary | ICD-10-CM | POA: Diagnosis present

## 2011-07-10 LAB — POCT I-STAT TROPONIN I: Troponin i, poc: 0.03 ng/mL (ref 0.00–0.08)

## 2011-07-11 ENCOUNTER — Emergency Department (HOSPITAL_COMMUNITY): Payer: Medicare Other

## 2011-07-11 ENCOUNTER — Other Ambulatory Visit (HOSPITAL_COMMUNITY): Payer: Medicare Other

## 2011-07-11 DIAGNOSIS — I5023 Acute on chronic systolic (congestive) heart failure: Secondary | ICD-10-CM

## 2011-07-11 LAB — I-STAT 8, (EC8 V) (CONVERTED LAB)
Acid-Base Excess: 2
Chloride: 105
Glucose, Bld: 158 — ABNORMAL HIGH
Hemoglobin: 15
Potassium: 4.3
Sodium: 137
TCO2: 27
pH, Ven: 7.471 — ABNORMAL HIGH

## 2011-07-11 LAB — CBC
HCT: 38.1 % — ABNORMAL LOW (ref 39.0–52.0)
Hemoglobin: 12.8 g/dL — ABNORMAL LOW (ref 13.0–17.0)
MCH: 29.6 pg (ref 26.0–34.0)
MCHC: 33.6 g/dL (ref 30.0–36.0)
MCV: 88 fL (ref 78.0–100.0)
RDW: 14.9 % (ref 11.5–15.5)

## 2011-07-11 LAB — COMPREHENSIVE METABOLIC PANEL
Albumin: 3.6 g/dL (ref 3.5–5.2)
Alkaline Phosphatase: 70 U/L (ref 39–117)
BUN: 25 mg/dL — ABNORMAL HIGH (ref 6–23)
Calcium: 8.9 mg/dL (ref 8.4–10.5)
Creatinine, Ser: 1.4 mg/dL — ABNORMAL HIGH (ref 0.50–1.35)
GFR calc Af Amer: 66 mL/min — ABNORMAL LOW (ref >90)
Glucose, Bld: 109 mg/dL — ABNORMAL HIGH (ref 70–99)
Potassium: 4.1 mEq/L (ref 3.5–5.1)
Total Protein: 6.8 g/dL (ref 6.0–8.3)

## 2011-07-11 LAB — DIFFERENTIAL
Eosinophils Relative: 2 % (ref 0–5)
Lymphocytes Relative: 30 % (ref 12–46)
Lymphs Abs: 2 10*3/uL (ref 0.7–4.0)
Monocytes Absolute: 0.7 10*3/uL (ref 0.1–1.0)
Monocytes Relative: 11 % (ref 3–12)
Neutro Abs: 3.8 10*3/uL (ref 1.7–7.7)

## 2011-07-11 LAB — HEMOGLOBIN A1C: Hgb A1c MFr Bld: 7.1 % — ABNORMAL HIGH (ref <5.7)

## 2011-07-11 LAB — POCT CARDIAC MARKERS: CKMB, poc: 1.1

## 2011-07-11 LAB — GLUCOSE, CAPILLARY
Glucose-Capillary: 146 mg/dL — ABNORMAL HIGH (ref 70–99)
Glucose-Capillary: 122 mg/dL — ABNORMAL HIGH (ref 70–99)

## 2011-07-11 LAB — PRO B NATRIURETIC PEPTIDE: Pro B Natriuretic peptide (BNP): 4233 pg/mL — ABNORMAL HIGH (ref 0–125)

## 2011-07-11 LAB — DIGOXIN LEVEL: Digoxin Level: <0.2 — ABNORMAL LOW

## 2011-07-11 LAB — B-NATRIURETIC PEPTIDE (CONVERTED LAB): Pro B Natriuretic peptide (BNP): 913 — ABNORMAL HIGH

## 2011-07-11 LAB — POCT I-STAT CREATININE: Operator id: 272551

## 2011-07-11 LAB — CK TOTAL AND CKMB (NOT AT ARMC)
Relative Index: 2.4 (ref 0.0–2.5)
Total CK: 113 U/L (ref 7–232)

## 2011-07-12 LAB — GLUCOSE, CAPILLARY
Glucose-Capillary: 175 mg/dL — ABNORMAL HIGH (ref 70–99)
Glucose-Capillary: 147 mg/dL — ABNORMAL HIGH (ref 70–99)

## 2011-07-12 LAB — COMPREHENSIVE METABOLIC PANEL
ALT: 140 U/L — ABNORMAL HIGH (ref 0–53)
Alkaline Phosphatase: 79 U/L (ref 39–117)
BUN: 30 mg/dL — ABNORMAL HIGH (ref 6–23)
CO2: 26 mEq/L (ref 19–32)
GFR calc Af Amer: 68 mL/min — ABNORMAL LOW (ref >90)
GFR calc non Af Amer: 59 mL/min — ABNORMAL LOW (ref >90)
Glucose, Bld: 147 mg/dL — ABNORMAL HIGH (ref 70–99)
Potassium: 4.8 mEq/L (ref 3.5–5.1)
Sodium: 134 mEq/L — ABNORMAL LOW (ref 135–145)
Total Bilirubin: 1.5 mg/dL — ABNORMAL HIGH (ref 0.3–1.2)

## 2011-07-12 LAB — PRO B NATRIURETIC PEPTIDE: Pro B Natriuretic peptide (BNP): 2131 pg/mL — ABNORMAL HIGH (ref 0–125)

## 2011-07-12 LAB — CBC
HCT: 39.6 % (ref 39.0–52.0)
Hemoglobin: 13 g/dL (ref 13.0–17.0)
RBC: 4.49 MIL/uL (ref 4.22–5.81)

## 2011-07-13 ENCOUNTER — Inpatient Hospital Stay (HOSPITAL_COMMUNITY): Payer: Medicare Other

## 2011-07-13 LAB — REMOTE ICD DEVICE
BRDY-0002RV: 40 {beats}/min
DEV-0020ICD: NEGATIVE
FVT: 0
HV IMPEDENCE: 84 Ohm
RV LEAD IMPEDENCE ICD: 348 Ohm
TOT-0001: 1
TZAT-0001FASTVT: 1
TZAT-0012FASTVT: 200 ms
TZAT-0019SLOWVT: 8 V
TZAT-0020FASTVT: 1.5 ms
TZAT-0020SLOWVT: 1.5 ms
TZON-0003SLOWVT: 400 ms
TZON-0003VSLOWVT: 340 ms
TZON-0004SLOWVT: 16
TZON-0005SLOWVT: 12
TZST-0001FASTVT: 2
TZST-0001FASTVT: 3
TZST-0001FASTVT: 4
TZST-0001SLOWVT: 2
TZST-0001SLOWVT: 3
TZST-0001SLOWVT: 5
TZST-0002FASTVT: NEGATIVE
TZST-0002FASTVT: NEGATIVE
TZST-0002SLOWVT: NEGATIVE
TZST-0002SLOWVT: NEGATIVE
TZST-0002SLOWVT: NEGATIVE
VF: 0

## 2011-07-13 LAB — COMPREHENSIVE METABOLIC PANEL
AST: 61 U/L — ABNORMAL HIGH (ref 0–37)
CO2: 26 mEq/L (ref 19–32)
Chloride: 94 mEq/L — ABNORMAL LOW (ref 96–112)
Creatinine, Ser: 1.26 mg/dL (ref 0.50–1.35)
GFR calc Af Amer: 75 mL/min — ABNORMAL LOW (ref >90)
GFR calc non Af Amer: 65 mL/min — ABNORMAL LOW (ref >90)
Glucose, Bld: 130 mg/dL — ABNORMAL HIGH (ref 70–99)
Total Bilirubin: 1.2 mg/dL (ref 0.3–1.2)

## 2011-07-13 LAB — CBC
HCT: 40.9 % (ref 39.0–52.0)
Hemoglobin: 13.7 g/dL (ref 13.0–17.0)
MCH: 29.5 pg (ref 26.0–34.0)
MCV: 88 fL (ref 78.0–100.0)
RBC: 4.65 MIL/uL (ref 4.22–5.81)

## 2011-07-13 LAB — MRSA PCR SCREENING: MRSA by PCR: POSITIVE — AB

## 2011-07-13 LAB — GLUCOSE, CAPILLARY: Glucose-Capillary: 134 mg/dL — ABNORMAL HIGH (ref 70–99)

## 2011-07-13 LAB — PRO B NATRIURETIC PEPTIDE: Pro B Natriuretic peptide (BNP): 1106 pg/mL — ABNORMAL HIGH (ref 0–125)

## 2011-07-13 LAB — CARBOXYHEMOGLOBIN: O2 Saturation: 62.7 %

## 2011-07-14 ENCOUNTER — Other Ambulatory Visit (HOSPITAL_COMMUNITY): Payer: Medicare Other

## 2011-07-14 ENCOUNTER — Other Ambulatory Visit: Payer: Medicare Other | Admitting: *Deleted

## 2011-07-14 LAB — GLUCOSE, CAPILLARY
Glucose-Capillary: 164 mg/dL — ABNORMAL HIGH (ref 70–99)
Glucose-Capillary: 118 mg/dL — ABNORMAL HIGH (ref 70–99)

## 2011-07-14 LAB — COMPREHENSIVE METABOLIC PANEL
ALT: 103 U/L — ABNORMAL HIGH (ref 0–53)
Alkaline Phosphatase: 86 U/L (ref 39–117)
BUN: 28 mg/dL — ABNORMAL HIGH (ref 6–23)
CO2: 30 mEq/L (ref 19–32)
GFR calc Af Amer: 82 mL/min — ABNORMAL LOW (ref >90)
GFR calc non Af Amer: 71 mL/min — ABNORMAL LOW (ref >90)
Glucose, Bld: 129 mg/dL — ABNORMAL HIGH (ref 70–99)
Potassium: 4 mEq/L (ref 3.5–5.1)
Sodium: 133 mEq/L — ABNORMAL LOW (ref 135–145)
Total Bilirubin: 1 mg/dL (ref 0.3–1.2)

## 2011-07-14 LAB — CBC
HCT: 41.4 % (ref 39.0–52.0)
Hemoglobin: 13.9 g/dL (ref 13.0–17.0)
RBC: 4.69 MIL/uL (ref 4.22–5.81)

## 2011-07-14 LAB — PRO B NATRIURETIC PEPTIDE: Pro B Natriuretic peptide (BNP): 612.1 pg/mL — ABNORMAL HIGH (ref 0–125)

## 2011-07-15 LAB — BASIC METABOLIC PANEL
CO2: 31 mEq/L (ref 19–32)
Calcium: 9.3 mg/dL (ref 8.4–10.5)
Chloride: 93 mEq/L — ABNORMAL LOW (ref 96–112)
Potassium: 4 mEq/L (ref 3.5–5.1)
Sodium: 132 mEq/L — ABNORMAL LOW (ref 135–145)

## 2011-07-15 LAB — CARBOXYHEMOGLOBIN
Methemoglobin: 0.7 % (ref 0.0–1.5)
Total hemoglobin: 14.2 g/dL (ref 13.5–18.0)

## 2011-07-15 LAB — GLUCOSE, CAPILLARY
Glucose-Capillary: 151 mg/dL — ABNORMAL HIGH (ref 70–99)
Glucose-Capillary: 97 mg/dL (ref 70–99)

## 2011-07-16 ENCOUNTER — Ambulatory Visit: Payer: Medicare Other | Admitting: Cardiovascular Disease

## 2011-07-16 LAB — BASIC METABOLIC PANEL
CO2: 31 mEq/L (ref 19–32)
Calcium: 9.6 mg/dL (ref 8.4–10.5)
GFR calc non Af Amer: 73 mL/min — ABNORMAL LOW (ref >90)
Sodium: 134 mEq/L — ABNORMAL LOW (ref 135–145)

## 2011-07-16 LAB — GLUCOSE, CAPILLARY
Glucose-Capillary: 139 mg/dL — ABNORMAL HIGH (ref 70–99)
Glucose-Capillary: 192 mg/dL — ABNORMAL HIGH (ref 70–99)

## 2011-07-16 LAB — CARBOXYHEMOGLOBIN
Methemoglobin: 1.2 % (ref 0.0–1.5)
Total hemoglobin: 14.9 g/dL (ref 13.5–18.0)

## 2011-07-17 LAB — BASIC METABOLIC PANEL
BUN: 26 mg/dL — ABNORMAL HIGH (ref 6–23)
Chloride: 91 mEq/L — ABNORMAL LOW (ref 96–112)
GFR calc Af Amer: 88 mL/min — ABNORMAL LOW (ref >90)
Glucose, Bld: 154 mg/dL — ABNORMAL HIGH (ref 70–99)
Potassium: 5 mEq/L (ref 3.5–5.1)

## 2011-07-17 LAB — CARBOXYHEMOGLOBIN: Total hemoglobin: 16.3 g/dL (ref 13.5–18.0)

## 2011-07-17 LAB — GLUCOSE, CAPILLARY
Glucose-Capillary: 164 mg/dL — ABNORMAL HIGH (ref 70–99)
Glucose-Capillary: 139 mg/dL — ABNORMAL HIGH (ref 70–99)

## 2011-07-18 LAB — BASIC METABOLIC PANEL
BUN: 22 mg/dL (ref 6–23)
CO2: 30 mEq/L (ref 19–32)
Calcium: 10.1 mg/dL (ref 8.4–10.5)
Chloride: 94 mEq/L — ABNORMAL LOW (ref 96–112)
Creatinine, Ser: 1.11 mg/dL (ref 0.50–1.35)
GFR calc Af Amer: 88 mL/min — ABNORMAL LOW (ref >90)
GFR calc non Af Amer: 76 mL/min — ABNORMAL LOW (ref >90)
Glucose, Bld: 129 mg/dL — ABNORMAL HIGH (ref 70–99)
Potassium: 4.8 mEq/L (ref 3.5–5.1)
Sodium: 133 mEq/L — ABNORMAL LOW (ref 135–145)

## 2011-07-18 LAB — CBC
HCT: 48.1 % (ref 39.0–52.0)
MCH: 29.9 pg (ref 26.0–34.0)
MCHC: 33.5 g/dL (ref 30.0–36.0)
RDW: 14.6 % (ref 11.5–15.5)

## 2011-07-18 LAB — GLUCOSE, CAPILLARY
Glucose-Capillary: 171 mg/dL — ABNORMAL HIGH (ref 70–99)
Glucose-Capillary: 152 mg/dL — ABNORMAL HIGH (ref 70–99)

## 2011-07-18 LAB — CARBOXYHEMOGLOBIN: O2 Saturation: 89.6 %

## 2011-07-21 NOTE — Progress Notes (Signed)
icd remote w/icm  

## 2011-07-22 ENCOUNTER — Encounter: Payer: Self-pay | Admitting: *Deleted

## 2011-07-23 ENCOUNTER — Ambulatory Visit (HOSPITAL_COMMUNITY)
Admit: 2011-07-23 | Discharge: 2011-07-23 | Disposition: A | Discharge status: Home / Self Care | Payer: Medicare Other | Attending: Internal Medicine | Admitting: Internal Medicine

## 2011-07-23 VITALS — BP 118/72 | HR 90 | Wt 336.5 lb

## 2011-07-23 DIAGNOSIS — K3189 Other diseases of stomach and duodenum: Secondary | ICD-10-CM | POA: Insufficient documentation

## 2011-07-23 DIAGNOSIS — Z95810 Presence of automatic (implantable) cardiac defibrillator: Secondary | ICD-10-CM | POA: Insufficient documentation

## 2011-07-23 DIAGNOSIS — I5022 Chronic systolic (congestive) heart failure: Secondary | ICD-10-CM | POA: Insufficient documentation

## 2011-07-23 DIAGNOSIS — J45909 Unspecified asthma, uncomplicated: Secondary | ICD-10-CM | POA: Insufficient documentation

## 2011-07-23 DIAGNOSIS — Z79899 Other long term (current) drug therapy: Secondary | ICD-10-CM | POA: Insufficient documentation

## 2011-07-23 DIAGNOSIS — G4733 Obstructive sleep apnea (adult) (pediatric): Secondary | ICD-10-CM | POA: Insufficient documentation

## 2011-07-23 DIAGNOSIS — K5730 Diverticulosis of large intestine without perforation or abscess without bleeding: Secondary | ICD-10-CM | POA: Insufficient documentation

## 2011-07-23 DIAGNOSIS — I10 Essential (primary) hypertension: Secondary | ICD-10-CM | POA: Insufficient documentation

## 2011-07-23 DIAGNOSIS — K648 Other hemorrhoids: Secondary | ICD-10-CM | POA: Insufficient documentation

## 2011-07-23 DIAGNOSIS — E119 Type 2 diabetes mellitus without complications: Secondary | ICD-10-CM | POA: Insufficient documentation

## 2011-07-23 DIAGNOSIS — I428 Other cardiomyopathies: Secondary | ICD-10-CM | POA: Insufficient documentation

## 2011-07-23 DIAGNOSIS — I509 Heart failure, unspecified: Secondary | ICD-10-CM

## 2011-07-23 DIAGNOSIS — Z7982 Long term (current) use of aspirin: Secondary | ICD-10-CM | POA: Insufficient documentation

## 2011-07-23 DIAGNOSIS — D509 Iron deficiency anemia, unspecified: Secondary | ICD-10-CM | POA: Insufficient documentation

## 2011-07-23 DIAGNOSIS — M109 Gout, unspecified: Secondary | ICD-10-CM | POA: Insufficient documentation

## 2011-07-23 LAB — BASIC METABOLIC PANEL
BUN: 27 mg/dL — ABNORMAL HIGH (ref 6–23)
Creatinine, Ser: 1.15 mg/dL (ref 0.50–1.35)
GFR calc Af Amer: 84 mL/min — ABNORMAL LOW (ref >90)
GFR calc non Af Amer: 73 mL/min — ABNORMAL LOW (ref >90)

## 2011-07-23 MED ORDER — CARVEDILOL 6.25 MG PO TABS: 9.3750 mg | ORAL_TABLET | Freq: Two times a day (BID) | ORAL | Status: DC

## 2011-07-23 NOTE — Assessment & Plan Note (Signed)
As above. Discussed strategies for weight loss and need to get weight down in case he needs advanced therapies.

## 2011-07-23 NOTE — Progress Notes (Signed)
PCP: Creola Corn EP: Hillis Range  HPI:   Gary Hutchinson is a 50 year old male with a history of systolic heart failure, ejection fraction 25-30% per echo in July 2012, nonischemic cardiomyopathy,  hypertension, type 2 diabetes mellitus, OSA on CPAP, history of ventricular tachycardia status post AICD placement in 2009, and morbid obesity who presents to the HF clinic for post-hospital f/u.  He was admitted in July 2012 for exacerbation of his heart failure symptoms. He diuresed nearly 40 pounds on IV Lasix and was discharged with a weight of 155.8 kg. Unfortunately he was readmitted last week with recurrent HF. He had sluggish response to IV lasix and placed on milrinone with good response. Going from 161kg to 151kg on d/c.  Doing well. Weighing himself everyday. Weight very stable in 330-332 range. Denies orthopnea or PND. Gets SOB with moderate activity but says he feels really good. No edema. Compliant with meds. Very interested in starting exercise regimen.     ROS: All systems negative except as listed in HPI, PMH and Problem List.  Past Medical History  Diagnosis Date  . Cardiomyopathy     nonischemic  . Ventricular tachycardia     s/p MDT ICD implant  . Alcohol abuse     now quit  . Pulmonary hypertension   . Diverticulosis of colon   . Hemorrhoids, internal   . Cholecystitis   . Dyspepsia   . Morbid obesity   . Obstructive sleep apnea   . Anemia     iron defi  . Hypertension   . Gout   . Diabetes mellitus   . Chronic systolic congestive heart failure   . Asthma     Current Outpatient Prescriptions  Medication Sig Dispense Refill  . albuterol (PROAIR HFA) 108 (90 BASE) MCG/ACT inhaler Inhale 2 puffs into the lungs every 6 (six) hours as needed.        Marland Kitchen aspirin 81 MG tablet Take 81 mg by mouth daily.        . carvedilol (COREG) 6.25 MG tablet Take 6.25 mg by mouth 2 (two) times daily with a meal.        . colchicine 0.6 MG tablet Take 0.6 mg by mouth as needed.       .  digoxin (LANOXIN) 0.25 MG tablet Take 250 mcg by mouth daily.        . fexofenadine (ALLEGRA) 180 MG tablet Take 180 mg by mouth as needed.       . Fluticasone-Salmeterol (ADVAIR DISKUS) 250-50 MCG/DOSE AEPB Inhale 1 puff into the lungs every 12 (twelve) hours as needed.       Marland Kitchen glipizide-metformin (METAGLIP) 2.5-250 MG per tablet Take 1 tablet by mouth 2 (two) times daily before a meal.        . montelukast (SINGULAIR) 10 MG tablet Take 10 mg by mouth daily as needed.       . potassium chloride SA (K-DUR,KLOR-CON) 20 MEQ tablet Take 20 mEq by mouth daily.        . ramipril (ALTACE) 10 MG tablet Take 10 mg by mouth daily.        . simvastatin (ZOCOR) 40 MG tablet Take 40 mg by mouth at bedtime.        Marland Kitchen spironolactone (ALDACTONE) 25 MG tablet Take 25 mg by mouth daily.        Marland Kitchen torsemide (DEMADEX) 20 MG tablet Take 20 mg by mouth 2 (two) times daily.  PHYSICAL EXAM: Filed Vitals:   07/23/11 1050  BP: 118/72  Pulse: 90   General:  Well appearing. No resp difficulty HEENT: normal Neck: supple. JVP flat. Carotids 2+ bilaterally; no bruits. No lymphadenopathy or thryomegaly appreciated. Cor: PMI nonpalpablel. Regular rate & rhythm. No rubs, gallops or murmurs. Lungs: clear Abdomen: obese, soft, nontender, nondistended.  Good bowel sounds. Extremities: no cyanosis, clubbing, rash, edema Neuro: alert & orientedx3, cranial nerves grossly intact. Moves all 4 extremities w/o difficulty. Affect pleasant.   ASSESSMENT & PLAN:

## 2011-07-23 NOTE — Patient Instructions (Signed)
Increase Carvedilol to 9/375 mg Twice daily   Labs today  Your physician recommends that you schedule a follow-up appointment in: 2 weeks

## 2011-07-23 NOTE — Assessment & Plan Note (Signed)
Doing very well. NYHA II-III. Volume status looks good. Will increase carvedilol to 9.375 bid. Long talk about use of sliding scale diuretics and how to contact our clinic if he has a question or fluid retention. We also discussed starting an exercise program to help him lose weight (goal 1/2 pound per week) in the event that he will require advanced therapies down the road. Will check BMET and BNP today. Return for f/u in 2 weeks.

## 2011-07-24 ENCOUNTER — Telehealth (HOSPITAL_COMMUNITY): Payer: Self-pay | Admitting: *Deleted

## 2011-07-24 NOTE — Telephone Encounter (Signed)
Pt aware.

## 2011-07-24 NOTE — Telephone Encounter (Signed)
Message copied by Noralee Space on Thu Jul 24, 2011  1:17 PM ------      Message from: Arvilla Meres R      Created: Wed Jul 23, 2011  1:09 PM       Stop potassium. Recheck in 2 weeks.

## 2011-07-28 ENCOUNTER — Telehealth (HOSPITAL_COMMUNITY): Payer: Self-pay | Admitting: *Deleted

## 2011-07-28 NOTE — Telephone Encounter (Signed)
Pt just wanted to let us know that he has been in the process of getting his SS disability renewed since Dec. 2011, he is continuing to completed paperwork, in the forms he just completed he listed Dr [name]'s name and enclosed his business card with contact info, he just wanted to let us know so that if they contact us we can give them the info they need.

## 2011-07-28 NOTE — Telephone Encounter (Signed)
Dr Wilkie Aye called this am,  He would like a call back.  He did not specifiy why he was calling.

## 2011-08-06 ENCOUNTER — Encounter: Payer: Self-pay | Admitting: Internal Medicine

## 2011-08-06 ENCOUNTER — Ambulatory Visit (HOSPITAL_COMMUNITY)
Admission: RE | Admit: 2011-08-06 | Discharge: 2011-08-06 | Disposition: A | Discharge status: Home / Self Care | Payer: Medicare Other | Source: Ambulatory Visit | Attending: Internal Medicine | Admitting: Internal Medicine

## 2011-08-06 ENCOUNTER — Encounter (HOSPITAL_COMMUNITY): Payer: Self-pay

## 2011-08-06 ENCOUNTER — Telehealth (HOSPITAL_COMMUNITY): Payer: Self-pay | Admitting: *Deleted

## 2011-08-06 DIAGNOSIS — K5730 Diverticulosis of large intestine without perforation or abscess without bleeding: Secondary | ICD-10-CM | POA: Insufficient documentation

## 2011-08-06 DIAGNOSIS — K648 Other hemorrhoids: Secondary | ICD-10-CM | POA: Insufficient documentation

## 2011-08-06 DIAGNOSIS — I428 Other cardiomyopathies: Secondary | ICD-10-CM | POA: Insufficient documentation

## 2011-08-06 DIAGNOSIS — J45909 Unspecified asthma, uncomplicated: Secondary | ICD-10-CM | POA: Insufficient documentation

## 2011-08-06 DIAGNOSIS — I5022 Chronic systolic (congestive) heart failure: Secondary | ICD-10-CM

## 2011-08-06 DIAGNOSIS — I509 Heart failure, unspecified: Secondary | ICD-10-CM

## 2011-08-06 DIAGNOSIS — Z7982 Long term (current) use of aspirin: Secondary | ICD-10-CM | POA: Insufficient documentation

## 2011-08-06 DIAGNOSIS — I10 Essential (primary) hypertension: Secondary | ICD-10-CM | POA: Insufficient documentation

## 2011-08-06 DIAGNOSIS — E119 Type 2 diabetes mellitus without complications: Secondary | ICD-10-CM | POA: Insufficient documentation

## 2011-08-06 DIAGNOSIS — Z79899 Other long term (current) drug therapy: Secondary | ICD-10-CM | POA: Insufficient documentation

## 2011-08-06 DIAGNOSIS — Z950 Presence of cardiac pacemaker: Secondary | ICD-10-CM | POA: Insufficient documentation

## 2011-08-06 DIAGNOSIS — G4733 Obstructive sleep apnea (adult) (pediatric): Secondary | ICD-10-CM | POA: Insufficient documentation

## 2011-08-06 DIAGNOSIS — M109 Gout, unspecified: Secondary | ICD-10-CM | POA: Insufficient documentation

## 2011-08-06 MED ORDER — CARVEDILOL 6.25 MG PO TABS: 12.5000 mg | ORAL_TABLET | Freq: Two times a day (BID) | ORAL | Status: DC

## 2011-08-06 NOTE — Progress Notes (Signed)
Patient seen and examined with Amy Clegg, NP. We discussed all aspects of the encounter. I agree with the assessment and plan as stated above.   

## 2011-08-06 NOTE — Patient Instructions (Addendum)
Important Heart Failure Medications:  Carvedilol (Coreg): "beta blocker", lowers blood pressure, prevents your heart from working too hard, helps you live longer Ramipril (Altace): "ACE inhibitor", lowers blood pressure, prevents your heart from working too hard, helps you live longer Spironolactone: works with lasix to keep fluid off, blocks hormones that can stress your heart, helps you live longer Furosemide (Lasix): keeps fluid off, helps you feel better Digoxin: helps your heart pump stronger, helps you feel better  Please take Carvedilol 12.5 mg in am and pm  Continue to weigh and record daily  Follow up in 3-4 weeks

## 2011-08-06 NOTE — Telephone Encounter (Signed)
Gary Hutchinson called this afternoon.  He said that he forgot to mention something to you during his appointment and would like a call back.

## 2011-08-06 NOTE — Assessment & Plan Note (Signed)
Doing very well. NYHA II-III. Volume status looks stable.Optivol assess fluid remains low with no crossing noted for the last 20 days. Will increase carvedilol to 12.5 mg  BID. Continue to educate about exercise and low salt diet. Will check digoxin level today.  Return for f/u in 2 weeks.

## 2011-08-06 NOTE — Progress Notes (Signed)
PCP: Creola Corn EP: Hillis Range  HPI:   Mr. Crable is a 50 year old male with a history of systolic heart failure, ejection fraction 25-30% per echo in July 2012, nonischemic cardiomyopathy,  hypertension, type 2 diabetes mellitus, OSA on CPAP, history of ventricular tachycardia status post AICD placement in 2009, and morbid obesity who presents to the HF clinic for post-hospital f/u.  He was admitted in July 2012 for exacerbation of his heart failure symptoms. He diuresed nearly 40 pounds on IV Lasix and was discharged with a weight of 155.8 kg. Unfortunately he was readmitted last week with recurrent HF. He had sluggish response to IV lasix and placed on milrinone with good response. Going from 161kg to 151kg on d/c.  07/23/2011 BNP 675 BUN 27 Creatinine 1.15 Increased Carvedilol 9.375 mg bid and digoxin 0.25mg  daily.   Doing well. Weighing himself everyday. Weight very stable in 328-336 range. Denies orthopnea or PND. SOB with moderate exertion.  No edema. Compliant with medications. He is walking on treadmill every other day 5 minutes. He plans to increase to 10 minutes. Follow low salt diet.     ROS: All systems negative except as listed in HPI, PMH and Problem List.  Past Medical History  Diagnosis Date  . Cardiomyopathy     nonischemic  . Ventricular tachycardia     s/p MDT ICD implant  . Alcohol abuse     now quit  . Pulmonary hypertension   . Diverticulosis of colon   . Hemorrhoids, internal   . Cholecystitis   . Dyspepsia   . Morbid obesity   . Obstructive sleep apnea   . Anemia     iron defi  . Hypertension   . Gout   . Diabetes mellitus   . Chronic systolic congestive heart failure   . Asthma     Current Outpatient Prescriptions  Medication Sig Dispense Refill  . albuterol (PROAIR HFA) 108 (90 BASE) MCG/ACT inhaler Inhale 2 puffs into the lungs every 6 (six) hours as needed.        Marland Kitchen aspirin 81 MG tablet Take 81 mg by mouth daily.        . carvedilol  (COREG) 6.25 MG tablet Take 1.5 tablets (9.375 mg total) by mouth 2 (two) times daily with a meal.      . colchicine 0.6 MG tablet Take 0.6 mg by mouth as needed.       . digoxin (LANOXIN) 0.25 MG tablet Take 250 mcg by mouth daily.        . fexofenadine (ALLEGRA) 180 MG tablet Take 180 mg by mouth as needed.       . Fluticasone-Salmeterol (ADVAIR DISKUS) 250-50 MCG/DOSE AEPB Inhale 1 puff into the lungs every 12 (twelve) hours as needed.       Marland Kitchen glipizide-metformin (METAGLIP) 2.5-250 MG per tablet Take 1 tablet by mouth 2 (two) times daily before a meal.        . montelukast (SINGULAIR) 10 MG tablet Take 10 mg by mouth daily as needed.       . ramipril (ALTACE) 10 MG tablet Take 10 mg by mouth daily.        . simvastatin (ZOCOR) 40 MG tablet Take 40 mg by mouth at bedtime.        Marland Kitchen spironolactone (ALDACTONE) 25 MG tablet Take 25 mg by mouth daily.        Marland Kitchen torsemide (DEMADEX) 20 MG tablet Take 20 mg by mouth 2 (two) times daily.  Weight 334 (336)  PHYSICAL EXAM: Filed Vitals:   08/06/11 0945  BP: 98/62  Pulse: 91   General:  Well appearing. No resp difficulty HEENT: normal Neck: supple. JVP flat. Carotids 2+ bilaterally; no bruits. No lymphadenopathy or thryomegaly appreciated. Cor: PMI nonpalpablel. Regular rate & rhythm. No rubs, gallops or murmurs. Lungs: clear Abdomen: obese, soft, nontender, nondistended.  Good bowel sounds. Extremities: no cyanosis, clubbing, rash, edema Neuro: alert & orientedx3, cranial nerves grossly intact. Moves all 4 extremities w/o difficulty. Affect pleasant.   ASSESSMENT & PLAN:

## 2011-08-06 NOTE — H&P (Signed)
NAMESADLER, TESCHNER               ACCOUNT NO.:  [id number]  MEDICAL RECORD NO.:  [id number]  LOCATION:  [facility]                         FACILITY:  [facility]  PHYSICIAN:  [name], MD       DATE OF BIRTH:  Mar 28, 1961  DATE OF ADMISSION:  07/10/2011 DATE OF DISCHARGE:                             HISTORY & PHYSICAL   PRIMARY CARE PROVIDER:  [name], MD  CARDIOLOGIST:  [name], MD  CHIEF COMPLAINT:  Congestive heart failure exacerbation, abdominal distention, dyspnea on exertion, and tight abdomen.  HISTORY OF PRESENT ILLNESS:  This is a 50 year old male with nonischemic cardiomyopathy with EF ranging between 10 and 20% up to 25-30%, history of pulmonary hypertension who presents with symptoms including distended abdomen, dyspnea on exertion, tight abdomen, orthopnea, PND, and one episode of recent nausea and vomiting.  He has got no chest pain.  He had a similar admission in July 2012 and had a prior admission in 2009, also pretty similar.  In the admission in July 2012, his echo showed EF of 25-30%, moderate MR, left atrial enlargement.  Abdominal ultrasound was negative for ascites and no hydro.  Sats were fine.  Here in the emergency room today, no lower extremity edema.  CK and troponins were negative.  Symptoms were progressive over the last week.  He has got decreased urination despite his Lasix.  In the ED, he was diagnosed with congestive heart failure exacerbation, given IV Lasix 80 mg, and diuresed.  He remained short of breath and dyspnea on exertion and needs to be admitted for further evaluation and treatment.  Systolic blood pressures are currently in the 90-100 range.  Will need Cardiology to eval.  PAST MEDICAL HISTORY: 1. Nonischemic cardiomyopathy with EF of 25-30%. 2. Diabetes mellitus type 2. 3. CKD stage II with an estimated creatinine clearance of 66. 4. Morbid obesity. 5. Poorly hypertension. 6. Hyperlipidemia. 7. Obstructive sleep apnea,  on CPAP. 8. AICD secondary to history of V tach. 9. Epidermoid cyst removal from the neck. 10.History of negative SPEP and UPEP. 11.Known gout. 12.Hypertension. 13.GERD. 14.Allergic rhinitis. 15.Asthma. 16.Osteoarthritis with low back pain. 17.Cath in 2005 was within normal limits.  ALLERGIES:  DILTIAZEM.  MEDICATION LIST:  Allegra, Advair, ramipril, aspirin, colchicine, Coreg, Flonase, glipizide/metformin, potassium, Lanoxin (?), Lasix, Aldactone, simvastatin, allopurinol, ProAir, triamcinolone, and Ambien.  FAMILY HISTORY:  Hypertension, rheumatoid arthritis, and asthma.  SOCIAL HISTORY:  He is single, 4 children.  No alcohol, but formerly abused.  No tobacco, quit in 1992.  REVIEW OF SYSTEMS:  Please see HPI.  He has got mild cough.  No edema. Abdominal distention.  No fever.  Full review of systems was obtained and otherwise negative.  He denies any current nausea, vomiting, diarrhea, or blood in the stools.  PHYSICAL EXAM:  VITAL SIGNS:  Temperature 97.9, blood pressure 95/60 up to 122/100, heart rate 89-103, respiratory rate 19, and saturating 96% on room air. GENERAL:  He is uncomfortable. PULMONARY:  Distant. CARDIAC:  Distant, AICD in place. NECK:  Thick. HEENT:  Oropharynx is dry. ABDOMEN:  Distended, obese, and large.  No rebound or guarding.  Bowel sounds are positive. EXTREMITIES:  No edema.  He is moving all 4s.  ANCILLARY DATA:  CK 113, MB 2.7, and troponin I of 0.03.  Pro-BNP is 4233.  Sodium 133, potassium 4.1, chloride 102, bicarb 25, BUN 25, creatinine 1.4, and glucose 109.  White count 6.8, hemoglobin 12.8, and platelet count 175.  AST 60, ALT 91, total bilirubin 1.3.  GFR 66 is estimated at 66.  Chest x-ray shows increased vascularity, cardiac enlargement, congestion without edema, AICD in place, bronchitic changes.  EKG shows sinus tachy, 103, no irregularity.  No changes from prior EKGs.  ASSESSMENT:  A man with very low ejection fraction, being  admitted with congestive heart failure exacerbation.  PLAN: 1. Admit. 2. Inpatient. 3. Uncomfortable 4. Similar to July 2012 admission. 5. Increased LFTs are presumed secondary to passive     congestion/congestive heart failure and his obesity with probable     fatty liver.  We will check abdominal ultrasound and follow liver     test. 6. Creatinine is stable, although requires observation, at the 1.4     range. 7. His abdominal discomfort is just like the last 2 admissions and     when we diurese, I am going to get his weight down and if his heart     failure under control, he usually feels much better. 8. CPAP for his obstructive sleep apnea. 9. Control diabetes with insulin while in hospital. 10.Cardiology consult will be obtained. 11.We will aggressively diurese; and if we cannot we may need to     transition him over to milrinone or aquaphoresis. 12.DVT prophylaxis has been ordered. 13.He is in desperate need about a 50-pound weight loss. 14.I have questioned whether he is still on digoxin at this current     time.  It is hard to figure out.  I will go back to my office and     check office notes.     [name], MD     JMR/MEDQ  D:  07/11/2011  T:  07/11/2011  Job:  [id number]  cc:   [name], M.D.  Electronically Signed by Creola Corn MD on 08/06/2011 09:10:53 PM

## 2011-08-07 NOTE — Telephone Encounter (Signed)
He just wanted to make sure Dr Gala Romney knew he had given his name to the SS disability, he had forgot to mention it yesterday at appt Dr Gala Romney is aware

## 2011-08-08 NOTE — Consult Note (Signed)
Gary Hutchinson, Gary Hutchinson               ACCOUNT NO.:  [id number]  MEDICAL RECORD NO.:  [id number]  LOCATION:  4704                         FACILITY:  [facility]  PHYSICIAN:  Bevelyn Buckles. Chantal Worthey, MDDATE OF BIRTH:  [birth year]  DATE OF CONSULTATION:  07/11/2011 DATE OF DISCHARGE:                                CONSULTATION   PRIMARY CARDIOLOGIST:  [name], MD  PRIMARY CARE PROVIDER:  [name], MD  REASON FOR CONSULTATION:  Acute on chronic systolic heart failure.  HISTORY OF PRESENT ILLNESS:  Mr. Carll is a 50 year old African American gentleman with a history of systolic heart failure, ejection fraction 25-30% per echo in July 2012, nonischemic cardiomyopathy, hypertension, type 2 diabetes mellitus, history of ventricular tachycardia status post AICD placement in 2009, and obesity who was admitted in July 2012 for exacerbation of his heart failure symptoms. He diuresed well on IV Lasix and was discharged with a weight of 155.8 kg.  The patient has been doing well since that time until approximately a week ago when he noticed progressive shortness of breath with minimal activity, orthopnea and lower extremity edema.  He presented to the emergency department with dyspnea and abdominal pain.  He states that he does not feel his diuretics are working at this time.  He denies chest pain, dizziness, palpitations, or fevers and chills.  He did state that over the last several days, he had an episode of nausea and vomiting where he could not keep down his medications.  Of note, he has also noticed decreased urine output.  He states he tries to be compliant with a low-sodium diet, but this is difficult to do all the time.  He does weigh himself daily and states his weight is up approximately 10 pounds over the last week or so.  In the emergency department, the patient was given 80 mg of IV Lasix. He did diurese 1400 mL.  His shortness of breath is improving, but not at baseline.   His peripheral edema has not changed much.  He still complains of some abdominal pain/distention, abdominal ultrasound performed today showed no ascites, although it is a difficult study secondary to the patient's body habitus.  Pro-BNP 4233 with the chest x- ray that is notable for increased pulmonary vascularity and cardiac enlargement suggestive of congestive changes.  Dr. [name] has admitted the patient for further diuresis and Heart Failure Service has been consulted for aid in management and possibly advance therapies.  PAST MEDICAL HISTORY: 1. Systolic congestive heart failure, ejection fraction of 25-30% per     echo on April 07, 2011. 2. Dilated cardiomyopathy. 3. Normal coronary arteries per cardiac catheterization in 2005. 4. Non-insulin-dependent diabetes mellitus. 5. Hypertension. 6. Pulmonary hypertension. 7. History of ventricular tachycardia, status post AICD in 2009.     a.     Followed by Dr. [name]. 8. Obstructive sleep apnea, on CPAP. 9. Gastroesophageal reflux disease. 10.Hyperlipidemia. 11.Obesity. 12.Gout. 13.Asthma. 14.Osteoarthritis.  SOCIAL HISTORY:  The patient lives at home with his son.  He has 4 children.  He is currently on disability.  He has a history of tobacco abuse, but quit in 1992.  He has a history of alcohol abuse,  but quit many years ago.  FAMILY HISTORY:  Noncontributory for premature coronary artery disease. His mother is alive with hypertension.  His father deceased secondary to cancer.  His brother and sister both have hypertension.  ALLERGIES:  DILTIAZEM causing rash.  INPATIENT MEDICATIONS: 1. Aspirin 320 mg daily. 2. Carvedilol 12.5 mg twice daily. 3. Lovenox 20 mg subcutaneously daily. 4. Fluticasone daily. 5. Lasix 40 mg IV twice daily. 6. NovoLog sliding-scale insulin. 7. Loratadine 10 mg daily. 8. Potassium chloride 20 mEq daily. 9. Ramipril 10 mg daily. 10.Simvastatin 40 mg daily. 11.Spironolactone 25 mg  daily.  REVIEW OF SYSTEMS:  All pertinent positives and negatives as stated in HPI.  Other systems have been reviewed and are negative.  PHYSICAL EXAMINATION:  VITAL SIGNS:  Temperature 97.8, pulse 105, respiration 18, blood pressure 121/80, O2 saturation 96% on room air, weight on admission 161.7. GENERAL:  This is a pleasant, obese, middle-aged gentleman, in no acute distress. HEENT:  Normal. NECK:  Supple without bruit.  JVD is increased to jawline. HEART:  Distant and tachycardic, but regular with S1 and S2.  Pulses 2+ and equal bilaterally.  No murmurs or rubs noted. LUNGS:  Diminished breath sounds throughout, but no clear wheezes, rales, or rhonchi. ABDOMEN:  Distended, nontender, obese with positive bowel sounds x4. EXTREMITIES:  No clubbing or cyanosis.  There is 1+ bilateral lower extremity edema. MUSCULOSKELETAL:  No joint deformities or effusions. NEURO:  Alert and oriented x3.  Cranial nerves II through XII grossly intact.  IMAGING:  Chest x-ray was demonstrating shallow inspiration with increased pulmonary vascularity and cardiac enlargement suggestive of congestive changes without edema.  Abdominal ultrasound showing no gallstones, fatty infiltration of the liver with slightly lobulated contour and no ascites demonstrated, prominence of the inferior vena cava.  Examination was limited by the patient's habitus.  EKG showing sinus tachycardia at a rate of 103 beats per minute.  No acute ST-T wave changes.  LABORATORY DATA:  WBC 6.8, hemoglobin 12.8, hematocrit 38.1, platelets 175.  Sodium 133, potassium 4.1, BUN 25, creatinine 1.4.  Total bilirubin 1.3, AST 60, ALT 91.  Pro-BNP 4233.  Cardiac enzymes negative x1.  Digoxin level less than 0.3.  ASSESSMENT AND PLAN:  This is a 50 year old gentleman with morbid obesity, diabetes mellitus, hypertension, and congestive heart failure secondary to nonischemic cardiomyopathy with an ejection fraction of 25%.  He was  admitted with acute on chronic systolic heart failure.  The patient still remains with volume overload on exam as his CVP is elevated to his jawline.  Currently our recommendation would be to increase his Lasix to 80 mg IV twice daily as he has responded well. His digoxin has been discontinued at one point in time, but this will be resumed.  If the patient is not responding to increased dose of Lasix, we can add milrinone, but we will currently hold off at this time.  We would recommend continuing an ACE inhibitor and beta blocker.  Of note, dry weight is approximately 155-156 kg.  We are happy to follow this patient while in hospital as well as in the outpatient setting in the Heart Failure Clinic if Dr. [name] is agreeable to this.  Thank you for this consultation.  We will continue to follow along with you.     Leonette Monarch, [name]   ______________________________ Bevelyn Buckles. Nadira Single, MD    NB/MEDQ  D:  07/11/2011  T:  07/12/2011  Job:  [id number]  cc:   [name], M.D.  Electronically Signed by Alen Blew P.A. on 07/22/2011 03:40:48 PM Electronically Signed by Arvilla Meres MD on 08/08/2011 02:07:18 PM

## 2011-08-08 NOTE — Discharge Summary (Signed)
NAMEMIKLE, STERNBERG               ACCOUNT NO.:  [id number]  MEDICAL RECORD NO.:  [id number]  LOCATION:  3309                         FACILITY:  [facility]  PHYSICIAN:  Bevelyn Buckles. Miasia Crabtree, MDDATE OF BIRTH:  1961/03/16  DATE OF ADMISSION:  07/10/2011 DATE OF DISCHARGE:  07/18/2011                              DISCHARGE SUMMARY   PRIMARY CARDIOLOGIST:  Bevelyn Buckles. Kanasia Gayman, MD  PRIMARY CARE PROVIDER:  [name], MD  DISCHARGE DIAGNOSES: 1. Acute on chronic systolic heart failure, ejection fraction 25%. 2. Acute respiratory distress, improved. 3. Chronic renal failure, stage III. 4. Obstructive sleep apnea. 5. Morbid obesity. 6. Nonischemic cardiomyopathy, ejection fraction 25%. 7. Diabetes mellitus type 2, non insulin-dependent.  SECONDARY DIAGNOSES: 1. Normal coronary arteries per cardiac catheterization in 2005. 2. Hypertension. 3. Pulmonary hypertension. 4. History of ventricular tachycardia status post AICD implantation in     2009 by Dr. [name]. 5. Gastroesophageal reflux disease. 6. Hyperlipidemia. 7. Gout.  ALLERGIES:  DILTIAZEM causing a rash.  PROCEDURES/DIAGNOSTICS PERFORMED DURING HOSPITALIZATION: 1. Chest x-ray on October 5:  Shallow inspiration showing increased     pulmonary vascularity and cardiac enlargement suggestive of     congestive changes without edema. 2. Abdominal ultrasound on October 5:  Examination limited by     patient's body habitus and bowel gas.  No gallstones, gallbladder     wall thickness top of normal.  Fatty infiltration of liver with     slightly lobulated contour.  No ascites demonstrated.  Prominence     of the inferior vena cava that may reflect increased right heart     pressures. 3. Chest x-ray on October 7, right PICC line tip, SVC, azygos junction     level.  Cardiomegaly with vascular congestion.  REASON FOR HOSPITALIZATION:  This is a 50 year old gentleman with the above stated problem list who had hospitalizations for  volume overload in July of this year but has been doing well since that time until approximately a week ago when he noted progressive shortness of breath. The patient complained of orthopnea, lower extremity edema, abdominal distention, and dyspnea with minimal activity.  The patient states that his weight was up approximately 10 pounds.  In the emergency department, pro BNP noted to be 4233 with a chest x-ray showing increased pulmonary vascularity and cardiac enlargement suggestive of congestive changes. The patient was given IV Lasix 80 mg and diuresed 1400 mL.  The patient was admitted by Dr. [name] for further diuresis.  HOSPITAL COURSE AND TREATMENT:  The patient was admitted to the telemetry unit and continued on IV Lasix at 40 mg twice daily. Initial BUN and creatinine were noted to be 25 and 1.4 respectively. The Heart Failure Service was consulted for recommendation of inotropic therapy versus ultrafiltration.  Upon evaluation by Dr. Oswaldo Done, the patient was still volume overload on exam with his JVP elevated to his jaw line.  Initially recommendations were to increase Lasix 80 mg IV twice daily as he was responding well. The patient's diuresis began to slow.  Therefore milrinone was added on October 7.  This showed excellent diuresis.  At this time, a PICC line was placed to monitor CVP and co-oximetry.  Initial co-oximetry was noted to be 69%.  The patient was continued on IV milrinone for 4 days prior to weaning off.  His co-ox continued to be monitored.  Milrinone was discontinued with a discharge co-oximetry of 89%.  The patient's renal function did bump to a creatinine of 1.37 but this did improve on milrinone and will be discharged with a creatinine of 1.11.  With the patient's NYHA class of III to IIIB, his digoxin was restarted during hospitalization.  This will also be continued at discharge.  Initially his Coreg was cut in half when he was placed on the milrinone.   This will also be continued at discharge.  The patient continued to diurese well and actually on October 11 it was felt that he had been over diuresed.  CVP was noted to be 0.  The patient had complaints of nausea and therefore his Lasix was discontinued and he was given a 250 mL of bolus.  This did improve his symptoms.  Recheck CVP was approximately 5. It is noted that his weight had decreased from 161 kg on admission to 151 kg on day of discharge.  On day of discharge, Dr. Gala Romney evaluated the patient, noted him stable for home with close outpatient followup.  Discharge medications and instructions were discussed with the patient and he voiced understanding.  DISCHARGE LABS:  Pro BNP 1359, digoxin 0.6, sodium 133, potassium 4.8, BUN 22, creatinine 1.11, co-oximetry 89%, hemoglobin 16.1, hematocrit 48.1.  DISCHARGE MEDICATIONS: 1. Digoxin 0.25 mg 1 tablet daily. 2. Carvedilol 12.5 mg half tablet twice daily. 3. Advair Diskus 250/50, 2 puffs twice daily as needed. 4. Aspirin 325 mg half tablet daily. 5. Colchicine 0.6 mg 1 tab twice daily. 6. Glipizide/metformin 2.5/250 mg 1 tab twice daily. 7. Klor-Con 20 mEq 1 tablet daily. 8. ProAir inhaler 2 puffs inhaled every 4 hours as needed. 9. Ramipril 10 mg 1 tablet daily. 10.Simvastatin 40 mg 1 tablet daily. 11.Spironolactone 25 mg 1 tablet daily. 12.Torsemide 20 mg 2 tablets twice daily.  FOLLOWUP PLANS AND INSTRUCTIONS: 1. The patient will follow up with Dr. Gala Romney in the Heart Failure     Clinic on October 17 at 10:30 a.m. 2. The patient has been instructed to weigh himself daily and record     these weights.  He is to call the office for an increase of 3     pounds in 24 hours or 5 pounds in a week.  He is to bring his     recordings to each appointment. 3. The patient should increase activity as tolerated. 4. The patient should continue low-sodium, heart healthy, and diabetic     diet. 5. The patient should call the  office in the interim for any problems     or concerns.  DURATION OF DISCHARGE:  Greater than 30 minutes with physician and PA time.     Leonette Monarch, [name]   ______________________________ Bevelyn Buckles. Carlo Guevarra, MD    NB/MEDQ  D:  07/18/2011  T:  07/18/2011  Job:  [id number]  cc:   Bevelyn Buckles. Calayah Guadarrama, MD [name], MD  Electronically Signed by Alen Blew P.A. on 07/22/2011 03:43:19 PM Electronically Signed by Arvilla Meres MD on 08/08/2011 02:07:15 PM

## 2011-08-13 ENCOUNTER — Telehealth (HOSPITAL_COMMUNITY): Payer: Self-pay | Admitting: *Deleted

## 2011-08-13 MED ORDER — DIGOXIN 250 MCG PO TABS: 125.0000 ug | ORAL_TABLET | Freq: Every day | ORAL | Status: DC

## 2011-08-13 NOTE — Telephone Encounter (Signed)
Pt aware.

## 2011-08-13 NOTE — Telephone Encounter (Signed)
Message copied by Noralee Space on Wed Aug 13, 2011  4:17 PM ------      Message from: [place], [place] D      Created: Thu Aug 07, 2011  9:45 AM       Please decrease digoxin 0.125 daily. Thank you

## 2011-08-19 ENCOUNTER — Ambulatory Visit: Payer: Medicare Other | Admitting: Pulmonary Disease

## 2011-09-04 ENCOUNTER — Ambulatory Visit (HOSPITAL_COMMUNITY)
Admission: RE | Admit: 2011-09-04 | Discharge: 2011-09-04 | Disposition: A | Discharge status: Home / Self Care | Payer: Medicare Other | Source: Ambulatory Visit | Attending: Internal Medicine | Admitting: Internal Medicine

## 2011-09-04 ENCOUNTER — Encounter (HOSPITAL_COMMUNITY): Payer: Self-pay

## 2011-09-04 VITALS — BP 122/68 | HR 90 | Wt 336.8 lb

## 2011-09-04 DIAGNOSIS — I5022 Chronic systolic (congestive) heart failure: Secondary | ICD-10-CM

## 2011-09-04 DIAGNOSIS — I509 Heart failure, unspecified: Secondary | ICD-10-CM

## 2011-09-04 LAB — BASIC METABOLIC PANEL
BUN: 16 mg/dL (ref 6–23)
CO2: 30 mEq/L (ref 19–32)
Calcium: 9.5 mg/dL (ref 8.4–10.5)
GFR calc non Af Amer: 66 mL/min — ABNORMAL LOW (ref >90)
Glucose, Bld: 109 mg/dL — ABNORMAL HIGH (ref 70–99)

## 2011-09-04 MED ORDER — CARVEDILOL 12.5 MG PO TABS: 18.7500 mg | ORAL_TABLET | Freq: Two times a day (BID) | ORAL | Status: DC

## 2011-09-04 MED ORDER — CARVEDILOL 6.25 MG PO TABS: 18.7500 mg | ORAL_TABLET | Freq: Two times a day (BID) | ORAL | Status: DC

## 2011-09-04 NOTE — Assessment & Plan Note (Addendum)
Doing very well. NYHA II-III. Volume status looks stable.Optivol reviewed with him: fluid remains low with no crossing noted for the last 6 weeks.Activity level increased 3-4 hours daily. Will increase carvedilol to 18.75 mg  BID. Continue to educate about exercise and low salt diet.   Return for follow up 2 months.   Patient seen and examined with Tonye Becket, NP. We discussed all aspects of the encounter. I agree with the assessment and plan as stated above.

## 2011-09-04 NOTE — Progress Notes (Cosign Needed)
Patient ID: Gary Hutchinson, male   DOB: [birth year], 50 y.o.   MRN: [id number]  PCP: Creola Corn EP: Hillis Range  HPI:   Gary Hutchinson is a 50 year old male with a history of systolic heart failure, ejection fraction 25-30% per echo in July 2012, nonischemic cardiomyopathy,  hypertension, type 2 diabetes mellitus, OSA on CPAP, history of ventricular tachycardia status post AICD placement in 2009, and morbid obesity who presents to the HF clinic for post-hospital f/u.  He was admitted in July 2012 for exacerbation of his heart failure symptoms. He diuresed nearly 40 pounds on IV Lasix and was discharged with a weight of 155.8 kg. Unfortunately he was readmitted last week with recurrent HF. He had sluggish response to IV lasix and placed on milrinone with good response. Going from 161kg to 151kg on d/c.  07/23/2011 BNP 675 BUN 27 Creatinine 1.15 Increased Carvedilol 9.375 mg bid and digoxin 0.25mg  daily.   He is here for follow up.  Complains of joint pain. Last visit Carvediolol increased 12.5 bid. and he has tolerated this .Denies SOB/PND/Orthpnea. Weighing himself everyday. Weight at home 331.  No edema. Compliant with medications. He has not been exercising due to joint pain. Follow low salt diet. He has not required extra Demadex.    ROS: All systems negative except as listed in HPI, PMH and Problem List.  Past Medical History  Diagnosis Date  . Cardiomyopathy     nonischemic  . Ventricular tachycardia     s/p MDT ICD implant  . Alcohol abuse     now quit  . Pulmonary hypertension   . Diverticulosis of colon   . Hemorrhoids, internal   . Cholecystitis   . Dyspepsia   . Morbid obesity   . Obstructive sleep apnea   . Anemia     iron defi  . Hypertension   . Gout   . Diabetes mellitus   . Chronic systolic congestive heart failure   . Asthma     Current Outpatient Prescriptions  Medication Sig Dispense Refill  . albuterol (PROAIR HFA) 108 (90 BASE) MCG/ACT inhaler Inhale 2  puffs into the lungs every 6 (six) hours as needed.        Marland Kitchen aspirin 81 MG tablet Take 81 mg by mouth daily.        . carvedilol (COREG) 6.25 MG tablet Take 2 tablets (12.5 mg total) by mouth 2 (two) times daily with a meal.  60 tablet  6  . colchicine 0.6 MG tablet Take 0.6 mg by mouth as needed.       . digoxin (LANOXIN) 0.25 MG tablet Take 0.5 tablets (125 mcg total) by mouth daily.      . fexofenadine (ALLEGRA) 180 MG tablet Take 180 mg by mouth as needed.       . Fluticasone-Salmeterol (ADVAIR DISKUS) 250-50 MCG/DOSE AEPB Inhale 1 puff into the lungs every 12 (twelve) hours as needed.       Marland Kitchen glipizide-metformin (METAGLIP) 2.5-250 MG per tablet Take 1 tablet by mouth 2 (two) times daily before a meal.        . montelukast (SINGULAIR) 10 MG tablet Take 10 mg by mouth daily as needed.       . ramipril (ALTACE) 10 MG tablet Take 10 mg by mouth daily.        . simvastatin (ZOCOR) 40 MG tablet Take 40 mg by mouth at bedtime.        Marland Kitchen spironolactone (ALDACTONE) 25 MG tablet Take 25  mg by mouth daily.        Marland Kitchen torsemide (DEMADEX) 20 MG tablet Take 40 mg by mouth 2 (two) times daily.        Weight 334 (336)  PHYSICAL EXAM: Filed Vitals:   09/04/11 1235  BP: 122/68  Pulse: 90   General:  Well appearing. No resp difficulty HEENT: normal Neck: supple. JVP flat. Carotids 2+ bilaterally; no bruits. No lymphadenopathy or thryomegaly appreciated. Cor: PMI nonpalpablel. Regular rate & rhythm. No rubs, gallops or 2/6 TR murmur. Lungs: clear Abdomen: obese, soft, nontender, nondistended.  Good bowel sounds. Extremities: no cyanosis, clubbing, rash, edema Neuro: alert & orientedx3, cranial nerves grossly intact. Moves all 4 extremities w/o difficulty. Affect pleasant.   ASSESSMENT & PLAN:

## 2011-09-04 NOTE — Assessment & Plan Note (Signed)
Continued to encourage exercise. Weight loss strategies discussed.

## 2011-09-04 NOTE — Patient Instructions (Addendum)
Take Carvedilol 18.75 mg twice a day  Weigh yourself EVERY morning after you go to the bathroom but before you eat or drink anything. Write this number down in a weight log/diary. If you gain 3 pounds overnight or 5 pounds in a week, call the heart failure clinic  Follow up 2  months.

## 2011-09-05 ENCOUNTER — Telehealth (HOSPITAL_COMMUNITY): Payer: Self-pay | Admitting: *Deleted

## 2011-09-05 NOTE — Telephone Encounter (Signed)
Gary Hutchinson called today returning your call.

## 2011-09-05 NOTE — Telephone Encounter (Signed)
Returned call, K elevated on labs yesterday.  Instructed to decrease spiro to 12.5 mg daily per Dr. Gala Romney.  He voiced understanding.

## 2011-09-11 ENCOUNTER — Encounter: Payer: Self-pay | Admitting: Pulmonary Disease

## 2011-09-11 ENCOUNTER — Ambulatory Visit (INDEPENDENT_AMBULATORY_CARE_PROVIDER_SITE_OTHER): Payer: Medicare Other | Admitting: Pulmonary Disease

## 2011-09-11 VITALS — BP 126/78 | HR 85 | Temp 98.4°F | Ht 66.0 in | Wt 343.6 lb

## 2011-09-11 DIAGNOSIS — G4733 Obstructive sleep apnea (adult) (pediatric): Secondary | ICD-10-CM

## 2011-09-11 NOTE — Progress Notes (Signed)
  Subjective:    Patient ID: Gary Hutchinson, male    DOB: [birth year], 50 y.o.   MRN: [id number]  HPI The patient comes in today for followup of his known severe obstructive sleep apnea.  He has been on bilevel compliantly, and has lost weight since the last visit.  He is having no issues with his mask fit or pressure, and feels that he sleeps well with adequate daytime alertness.   Review of Systems  Constitutional: Negative for fever and unexpected weight change.  HENT: Negative for ear pain, nosebleeds, congestion, sore throat, rhinorrhea, sneezing, trouble swallowing, dental problem, postnasal drip and sinus pressure.   Eyes: Negative for redness and itching.  Respiratory: Negative for cough, chest tightness, shortness of breath and wheezing.   Cardiovascular: Negative for palpitations and leg swelling.  Gastrointestinal: Negative for nausea and vomiting.  Genitourinary: Negative for dysuria.  Musculoskeletal: Negative for joint swelling.  Skin: Negative for rash.  Neurological: Negative for headaches.  Hematological: Does not bruise/bleed easily.  Psychiatric/Behavioral: Negative for dysphoric mood. The patient is not nervous/anxious.        Objective:   Physical Exam Morbidly obese male in no acute distress No skin breakdown or pressure necrosis from the CPAP mask Lower extremities with no significant edema, no cyanosis noted Alert, does not appear to be sleepy, moves all 4 extremities.       Assessment & Plan:

## 2011-09-11 NOTE — Assessment & Plan Note (Signed)
The patient is doing very well from a sleep apnea standpoint.  He is compliant with his BiPAP, and feels that he sleeps well with adequate daytime alertness.  He has lost weight since the last visit, and I have encouraged him to continue.  He does have on rare occasions sleep onset issues, and I have recommended as needed melatonin 3 mg 3-4 hours before bedtime.  I have asked him to keep up with his mask changes and supplies, and to followup with me in one year.

## 2011-09-11 NOTE — Patient Instructions (Signed)
Continue with bipap Work on weight loss followup with me in one year, but call if having issues with your machine.

## 2011-10-09 ENCOUNTER — Other Ambulatory Visit: Payer: Self-pay | Admitting: Internal Medicine

## 2011-10-09 ENCOUNTER — Encounter: Payer: Self-pay | Admitting: Internal Medicine

## 2011-10-09 ENCOUNTER — Ambulatory Visit (INDEPENDENT_AMBULATORY_CARE_PROVIDER_SITE_OTHER): Payer: Medicare Other | Admitting: *Deleted

## 2011-10-09 DIAGNOSIS — I5022 Chronic systolic (congestive) heart failure: Secondary | ICD-10-CM

## 2011-10-09 DIAGNOSIS — I472 Ventricular tachycardia: Secondary | ICD-10-CM

## 2011-10-09 DIAGNOSIS — I509 Heart failure, unspecified: Secondary | ICD-10-CM

## 2011-10-09 DIAGNOSIS — I428 Other cardiomyopathies: Secondary | ICD-10-CM

## 2011-10-11 LAB — REMOTE ICD DEVICE
BATTERY VOLTAGE: 3.11 V
CHARGE TIME: 10.58 s
HV IMPEDENCE: 88 Ohm
RV LEAD AMPLITUDE: 8.528 mv
TOT-0002: 0
TOT-0006: 20090901000000
TZAT-0001FASTVT: 1
TZAT-0012FASTVT: 200 ms
TZAT-0019SLOWVT: 8 V
TZAT-0020FASTVT: 1.5 ms
TZAT-0020SLOWVT: 1.5 ms
TZON-0003SLOWVT: 400 ms
TZON-0003VSLOWVT: 340 ms
TZON-0004SLOWVT: 16
TZON-0005SLOWVT: 12
TZST-0001FASTVT: 2
TZST-0001FASTVT: 3
TZST-0001FASTVT: 4
TZST-0001SLOWVT: 2
TZST-0001SLOWVT: 3
TZST-0001SLOWVT: 5
TZST-0002FASTVT: NEGATIVE
TZST-0002FASTVT: NEGATIVE
TZST-0002SLOWVT: NEGATIVE
TZST-0002SLOWVT: NEGATIVE
VENTRICULAR PACING ICD: 0 pct

## 2011-10-13 ENCOUNTER — Encounter: Payer: Self-pay | Admitting: Internal Medicine

## 2011-10-16 ENCOUNTER — Encounter: Payer: Self-pay | Admitting: *Deleted

## 2011-10-16 NOTE — Progress Notes (Signed)
Remote icd check w/icm  

## 2011-10-20 ENCOUNTER — Telehealth: Payer: Self-pay | Admitting: *Deleted

## 2011-10-20 NOTE — Telephone Encounter (Signed)
Pt f/u with Dr Gala Romney and has f/u, feeling ok currently

## 2011-11-10 ENCOUNTER — Encounter (HOSPITAL_COMMUNITY): Payer: Medicare Other

## 2011-11-12 ENCOUNTER — Encounter: Payer: Self-pay | Admitting: Internal Medicine

## 2011-11-13 ENCOUNTER — Encounter: Payer: Self-pay | Admitting: Internal Medicine

## 2011-11-19 ENCOUNTER — Ambulatory Visit (HOSPITAL_COMMUNITY): Payer: Medicare Other

## 2011-12-11 ENCOUNTER — Encounter (HOSPITAL_COMMUNITY): Payer: Medicare Other

## 2011-12-26 ENCOUNTER — Ambulatory Visit (HOSPITAL_COMMUNITY)
Admission: RE | Admit: 2011-12-26 | Discharge: 2011-12-26 | Disposition: A | Discharge status: Home / Self Care | Payer: Medicare Other | Source: Ambulatory Visit | Attending: Internal Medicine | Admitting: Internal Medicine

## 2011-12-26 ENCOUNTER — Encounter (HOSPITAL_COMMUNITY): Payer: Self-pay

## 2011-12-26 ENCOUNTER — Encounter: Payer: Self-pay | Admitting: Internal Medicine

## 2011-12-26 VITALS — BP 114/60 | HR 99 | Wt 346.8 lb

## 2011-12-26 DIAGNOSIS — I509 Heart failure, unspecified: Secondary | ICD-10-CM

## 2011-12-26 DIAGNOSIS — I5022 Chronic systolic (congestive) heart failure: Secondary | ICD-10-CM | POA: Insufficient documentation

## 2011-12-26 DIAGNOSIS — E669 Obesity, unspecified: Secondary | ICD-10-CM | POA: Insufficient documentation

## 2011-12-26 LAB — BASIC METABOLIC PANEL
BUN: 17 mg/dL (ref 6–23)
Calcium: 9.4 mg/dL (ref 8.4–10.5)
Creatinine, Ser: 1.23 mg/dL (ref 0.50–1.35)
GFR calc Af Amer: 78 mL/min — ABNORMAL LOW (ref >90)

## 2011-12-26 MED ORDER — CARVEDILOL 12.5 MG PO TABS: 25.0000 mg | ORAL_TABLET | Freq: Two times a day (BID) | ORAL | Status: DC

## 2011-12-26 NOTE — Progress Notes (Signed)
Patient ID: Gary Hutchinson, male   DOB: December 29, 1960, 51 y.o.   MRN: [id number]  PCP: Creola Corn EP: Hillis Range  HPI:   Mr. Morandi is a 51 year old male with a history of systolic heart failure, ejection fraction 25-30% per echo in July 2012, nonischemic cardiomyopathy,  hypertension, type 2 diabetes mellitus, OSA on CPAP, history of ventricular tachycardia status post AICD placement in 2009, and morbid obesity who presents to the HF clinic for post-hospital f/u.  7/12 [facility] Admitted and treated for volume overload with subsuquent re-admit the next week for volume overload. Sluggish response to IV Lasix and placed on Milrinone. D/C weight 151 kg.  Last visit Carvedilol increased 18.75 mg twice a day.  Spironolactone decreased to 12.5 daily due potassium 5.4 .   He is here for follow up. Denies SOB/PND/Orthopnea. Weight at home 337 pounds. He has not had any extra Torsemide. Walks on treadmill occasionally. Not following low salt diet. Continues to use CPAP. He has had 2 gout flares over the last 3 months.    ROS: All systems negative except as listed in HPI, PMH and Problem List.  Past Medical History  Diagnosis Date  . Cardiomyopathy     nonischemic  . Ventricular tachycardia     s/p MDT ICD implant  . Alcohol abuse     now quit  . Pulmonary hypertension   . Diverticulosis of colon   . Hemorrhoids, internal   . Cholecystitis   . Dyspepsia   . Morbid obesity   . Obstructive sleep apnea   . Anemia     iron defi  . Hypertension   . Gout   . Diabetes mellitus   . Chronic systolic congestive heart failure   . Asthma     Current Outpatient Prescriptions  Medication Sig Dispense Refill  . albuterol (PROAIR HFA) 108 (90 BASE) MCG/ACT inhaler Inhale 2 puffs into the lungs every 6 (six) hours as needed.        Marland Kitchen aspirin 81 MG tablet Take 81 mg by mouth daily.        . carvedilol (COREG) 12.5 MG tablet Take 1.5 tablets (18.75 mg total) by mouth 2 (two) times daily with a meal.  60  tablet  6  . colchicine 0.6 MG tablet Take 0.6 mg by mouth as needed.       . digoxin (LANOXIN) 0.25 MG tablet Take 0.5 tablets (125 mcg total) by mouth daily.      . fexofenadine (ALLEGRA) 180 MG tablet Take 180 mg by mouth as needed.       . Fluticasone-Salmeterol (ADVAIR DISKUS) 250-50 MCG/DOSE AEPB Inhale 1 puff into the lungs every 12 (twelve) hours as needed.       Marland Kitchen glipizide-metformin (METAGLIP) 2.5-250 MG per tablet Take 1 tablet by mouth 2 (two) times daily before a meal.        . montelukast (SINGULAIR) 10 MG tablet Take 10 mg by mouth daily as needed.       . ramipril (ALTACE) 10 MG tablet Take 10 mg by mouth daily.        . simvastatin (ZOCOR) 40 MG tablet Take 40 mg by mouth at bedtime.        Marland Kitchen spironolactone (ALDACTONE) 25 MG tablet Take 12.5 mg by mouth daily.       Marland Kitchen torsemide (DEMADEX) 20 MG tablet Take 40 mg by mouth 2 (two) times daily.         PHYSICAL EXAM: Filed Vitals:  12/26/11 0831  BP: 114/60  Pulse: 99  346  Pounds  (343) General:  Well appearing. No resp difficulty HEENT: normal Neck: supple. JVP flat. Carotids 2+ bilaterally; no bruits. No lymphadenopathy or thryomegaly appreciated. Cor: PMI nonpalpablel. Regular rate & rhythm. No rubs, gallops or 2/6 TR murmur. Lungs: clear Abdomen: obese, soft, nontender, nondistended.  Good bowel sounds. Extremities: no cyanosis, clubbing, rash, edema Neuro: alert & orientedx3, cranial nerves grossly intact. Moves all 4 extremities w/o difficulty. Affect pleasant.   ASSESSMENT & PLAN:

## 2011-12-26 NOTE — Assessment & Plan Note (Addendum)
Encouraged to lose weight. Encouraged to try and walk daily. He will follow up in and plans be down to 333 pounds.

## 2011-12-26 NOTE — Assessment & Plan Note (Addendum)
NYHA II. Optivol reviewed  Two crossings noted January in February however volume back down.  Increase Carvedilol 25 mg twice a day. Check BMET.He is instructed to weigh and record, and take extra Torsemide 20 mg if weight increased 3-5 pounds in 24 hours. Follow in 3 months with an ECHO.   Patient seen and examined with Tonye Becket, NP. We discussed all aspects of the encounter. I agree with the assessment and plan as stated above.  Patient overall doing OK but volume status has been labile. We reviewed Optivol with him in detail. Reinforced need for daily weights and reviewed use of sliding scale diuretics. Encouraged him to get back on track with weight loss efforts.

## 2011-12-26 NOTE — Patient Instructions (Addendum)
Take Carvedilol 25 mg twice a day  Take an extra Torsemide if your weight is up 3-5 pounds.  Follow up 3 months with an ECHO  Do the following things EVERYDAY: 1) Weigh yourself in the morning before breakfast. Write it down and keep it in a log. 2) Take your medicines as prescribed 3) Eat low salt foods--Limit salt (sodium) to 2000mg  per day.  4) Stay as active as you can everyday

## 2012-01-08 ENCOUNTER — Telehealth (HOSPITAL_COMMUNITY): Payer: Self-pay | Admitting: *Deleted

## 2012-01-08 DIAGNOSIS — I5022 Chronic systolic (congestive) heart failure: Secondary | ICD-10-CM

## 2012-01-08 NOTE — Telephone Encounter (Signed)
Message copied by Noralee Space on Thu Jan 08, 2012  2:44 PM ------      Message from: Arvilla Meres R      Created: Wed Dec 31, 2011 10:59 PM       Hemolyzed. Please have him go to lab to repeat.

## 2012-01-12 ENCOUNTER — Other Ambulatory Visit (INDEPENDENT_AMBULATORY_CARE_PROVIDER_SITE_OTHER): Payer: Medicare Other

## 2012-01-12 DIAGNOSIS — I5022 Chronic systolic (congestive) heart failure: Secondary | ICD-10-CM

## 2012-01-12 LAB — BASIC METABOLIC PANEL
Calcium: 9.1 mg/dL (ref 8.4–10.5)
Creatinine, Ser: 1.3 mg/dL (ref 0.4–1.5)
GFR: 74.19 mL/min (ref >60.00)
Glucose, Bld: 119 mg/dL — ABNORMAL HIGH (ref 70–99)
Sodium: 139 mEq/L (ref 135–145)

## 2012-02-27 ENCOUNTER — Encounter: Payer: Self-pay | Admitting: Internal Medicine

## 2012-02-27 ENCOUNTER — Telehealth: Payer: Self-pay | Admitting: Internal Medicine

## 2012-02-27 NOTE — Telephone Encounter (Signed)
02-27-12 lmm @ 215pm for pt to call to set up a defib ck wih dr allred/mt

## 2012-03-14 ENCOUNTER — Other Ambulatory Visit (HOSPITAL_COMMUNITY): Payer: Self-pay | Admitting: Adult Health

## 2012-03-16 ENCOUNTER — Other Ambulatory Visit (HOSPITAL_COMMUNITY): Payer: Self-pay | Admitting: *Deleted

## 2012-03-16 MED ORDER — CARVEDILOL 25 MG PO TABS: 25.0000 mg | ORAL_TABLET | Freq: Two times a day (BID) | ORAL | Status: DC

## 2012-03-20 ENCOUNTER — Emergency Department (HOSPITAL_COMMUNITY)
Admission: EM | Admit: 2012-03-20 | Discharge: 2012-03-20 | Disposition: A | Discharge status: Home / Self Care | Payer: Medicare Other | Attending: Emergency Medicine | Admitting: Emergency Medicine

## 2012-03-20 ENCOUNTER — Encounter (HOSPITAL_COMMUNITY): Payer: Self-pay | Admitting: *Deleted

## 2012-03-20 DIAGNOSIS — I428 Other cardiomyopathies: Secondary | ICD-10-CM | POA: Insufficient documentation

## 2012-03-20 DIAGNOSIS — I10 Essential (primary) hypertension: Secondary | ICD-10-CM | POA: Insufficient documentation

## 2012-03-20 DIAGNOSIS — M109 Gout, unspecified: Secondary | ICD-10-CM | POA: Insufficient documentation

## 2012-03-20 DIAGNOSIS — I509 Heart failure, unspecified: Secondary | ICD-10-CM | POA: Insufficient documentation

## 2012-03-20 DIAGNOSIS — R209 Unspecified disturbances of skin sensation: Secondary | ICD-10-CM | POA: Insufficient documentation

## 2012-03-20 DIAGNOSIS — M25539 Pain in unspecified wrist: Secondary | ICD-10-CM | POA: Insufficient documentation

## 2012-03-20 DIAGNOSIS — I5022 Chronic systolic (congestive) heart failure: Secondary | ICD-10-CM | POA: Insufficient documentation

## 2012-03-20 DIAGNOSIS — M25531 Pain in right wrist: Secondary | ICD-10-CM

## 2012-03-20 DIAGNOSIS — E119 Type 2 diabetes mellitus without complications: Secondary | ICD-10-CM | POA: Insufficient documentation

## 2012-03-20 DIAGNOSIS — J45909 Unspecified asthma, uncomplicated: Secondary | ICD-10-CM | POA: Insufficient documentation

## 2012-03-20 MED ORDER — TRAMADOL HCL 50 MG PO TABS: 50.0000 mg | ORAL_TABLET | Freq: Four times a day (QID) | ORAL | Status: AC | PRN

## 2012-03-20 MED ORDER — TRAMADOL HCL 50 MG PO TABS
50.0000 mg | ORAL_TABLET | Freq: Once | ORAL | Status: AC
  Administered 2012-03-20: 50 mg via ORAL
  Filled 2012-03-20: qty 1

## 2012-03-20 NOTE — Discharge Instructions (Signed)
As we discussed, you can take the ultram in addition to tylenol for your pain. Follow-up with your doctor if your pain and swelling do not continue to improve.  Apply ice to your injured areas for 15-20 minutes three times a day for the next several days to help reduce swelling and inflammation.  Wear the wrist splint while you are sleeping at all times and during the day as needed for comfort.   Wrist Pain Wrist injuries are frequent in adults and children. A sprain is an injury to the ligaments that hold your bones together. A strain is an injury to muscle or muscle cord-like structures (tendons) from stretching or pulling. Generally, when wrists are moderately tender to touch following a fall or injury, a break in the bone (fracture) may be present. Most wrist sprains or strains are better in 3 to 5 days, but complete healing may take several weeks. HOME CARE INSTRUCTIONS   Put ice on the injured area.   Put ice in a plastic bag.   Place a towel between your skin and the bag.   Leave the ice on for 15 to 20 minutes, 3 to 4 times a day, for the first 2 days.   Keep your arm raised above the level of your heart whenever possible to reduce swelling and pain.   Rest the injured area for at least 48 hours or as directed by your caregiver.   If a splint or elastic bandage has been applied, use it for as long as directed by your caregiver or until seen by a caregiver for a follow-up exam.   Only take over-the-counter or prescription medicines for pain, discomfort, or fever as directed by your caregiver.   Keep all follow-up appointments. You may need to follow up with a specialist or have follow-up X-rays. Improvement in pain level is not a guarantee that you did not fracture a bone in your wrist. The only way to determine whether or not you have a broken bone is by X-ray.  SEEK IMMEDIATE MEDICAL CARE IF:   Your fingers are swollen, very red, white, or cold and blue.   Your fingers are  numb or tingling.   You have increasing pain.   You have difficulty moving your fingers.  MAKE SURE YOU:   Understand these instructions.   Will watch your condition.   Will get help right away if you are not doing well or get worse.  Document Released: 07/02/2005 Document Revised: 09/11/2011 Document Reviewed: 11/13/2010 [facility] Patient Information 2012 [place], [place].

## 2012-03-20 NOTE — ED Notes (Signed)
PA at bedside.

## 2012-03-20 NOTE — ED Notes (Signed)
Patient reports he has had pain in his right wrist and arm for 2 weeks.  He has hx of gout.  He reports he did a lot of lifting and using his arms.  Patient states his right wrist has continued to be painful

## 2012-03-20 NOTE — ED Provider Notes (Signed)
History     CSN: [id number]  Arrival date & time 03/20/12  4098   First MD Initiated Contact with Patient 03/20/12 [phone]      Chief Complaint  Patient presents with  . Arm Pain  . Wrist Pain    (Consider location/radiation/quality/duration/timing/severity/associated sxs/prior treatment) Patient is a 51 y.o. male presenting with wrist pain. The history is provided by the patient.  Wrist Pain This is a new problem. The current episode started 1 to 4 weeks ago. The problem occurs constantly. The problem has been gradually improving. Associated symptoms include numbness. Pertinent negatives include no fever, neck pain or weakness.  pt RHD. Pain x 2 weeks after a lot of lifting and repetitive movements with UE for his daughter's graduation. Initially bilateral, with left resolved. Right wrist with aching/throbbing pain assoc with decreased sensation worse to 1st 3 digits, gradually improving. C/o swelling that has improved. Denies weakness. No fever. Hx gout, but no color change or excess warmth. Worse with use of the right hand/wrist. Has taken acetaminophen and applied ice with some relief.  Past Medical History  Diagnosis Date  . Cardiomyopathy     nonischemic  . Ventricular tachycardia     s/p MDT ICD implant  . Alcohol abuse     now quit  . Pulmonary hypertension   . Diverticulosis of colon   . Hemorrhoids, internal   . Cholecystitis   . Dyspepsia   . Morbid obesity   . Obstructive sleep apnea   . Anemia     iron defi  . Hypertension   . Gout   . Diabetes mellitus   . Chronic systolic congestive heart failure   . Asthma     Past Surgical History  Procedure Date  . Icd implantation     ICD- [employer] 9/09  . Cardiac catheterization 03/08/2004    EF 25-30%  . US echocardiography 08/22/2008    EF 30-35%  . Transthoracic echocardiogram 05/27/2008    EF 30-35%    Family History  Problem Relation Age of Onset  . Cancer Father   . Diabetes      grandparents  .  Hypertension Mother     History  Substance Use Topics  . Smoking status: Never Smoker   . Smokeless tobacco: Not on file  . Alcohol Use: No     former heavy ETOH, quit      Review of Systems  Constitutional: Negative for fever.  HENT: Negative for neck pain.   Musculoskeletal:       See HPI  Neurological: Positive for numbness. Negative for weakness.    Allergies  Review of patient's allergies indicates no known allergies.  Home Medications   Current Outpatient Rx  Name Route Sig Dispense Refill  . ALBUTEROL SULFATE HFA 108 (90 BASE) MCG/ACT IN AERS Inhalation Inhale 2 puffs into the lungs every 6 (six) hours as needed.      . ASPIRIN 81 MG PO TABS Oral Take 81 mg by mouth daily.      Marland Kitchen CARVEDILOL 25 MG PO TABS Oral Take 1 tablet (25 mg total) by mouth 2 (two) times daily with a meal. 60 tablet 6  . COLCHICINE 0.6 MG PO TABS Oral Take 0.6 mg by mouth as needed.     Marland Kitchen DIGOXIN 0.25 MG PO TABS Oral Take 0.5 tablets (125 mcg total) by mouth daily.    Marland Kitchen FEXOFENADINE HCL 180 MG PO TABS Oral Take 180 mg by mouth as needed.     Marland Kitchen  FLUTICASONE-SALMETEROL 250-50 MCG/DOSE IN AEPB Inhalation Inhale 1 puff into the lungs every 12 (twelve) hours as needed.     Marland Kitchen GLIPIZIDE-METFORMIN HCL 2.5-250 MG PO TABS Oral Take 1 tablet by mouth 2 (two) times daily before a meal.      . MONTELUKAST SODIUM 10 MG PO TABS Oral Take 10 mg by mouth daily as needed.     Marland Kitchen RAMIPRIL 10 MG PO TABS Oral Take 10 mg by mouth daily.      Marland Kitchen SIMVASTATIN 40 MG PO TABS Oral Take 40 mg by mouth at bedtime.      . SPIRONOLACTONE 25 MG PO TABS Oral Take 12.5 mg by mouth daily.     . TORSEMIDE 20 MG PO TABS Oral Take 40 mg by mouth 2 (two) times daily.       BP 111/62  Pulse 90  Temp 98.1 F (36.7 C) (Oral)  Resp 18  SpO2 96%  Physical Exam  Nursing note reviewed. Constitutional: He appears well-developed. No distress.       Vital signs are reviewed and are normal.   HENT:  Head: Normocephalic and atraumatic.    Neck: Neck supple.       No spinous process or muscular TTP  Cardiovascular: Normal rate and regular rhythm.        Bilateral radial pulses 2+  Pulmonary/Chest: Effort normal. No respiratory distress.  Musculoskeletal:       Right wrist: He exhibits tenderness (over soft tissues only. No snuffbox TTP) and swelling. He exhibits normal range of motion, no bony tenderness, no crepitus, no deformity and no laceration.       Arms:      Right hand: He exhibits normal range of motion, no tenderness, no bony tenderness, normal capillary refill, no deformity, no laceration and no swelling. Medial distribution: see neuro exam. Normal strength noted.  Neurological: He is alert.       Altered but present sensation to light touch in all digits of affected extremity. Pain perception intact.  Skin: Skin is warm and dry. No erythema.    ED Course  Procedures (including critical care time)  Labs Reviewed - No data to display No results found.  6:55 AM Pt seen and evaluated. No acute distress.  Dx 1: Right wrist pain   MDM  Acute right wrist pain with no known injury. No bony TTP or deformity to suggest plain films warranted, pt agrees. No fever or red, hot joint to suggest septic arthritis or gout. Slightly decreased sensation only to light touch to median nerve distribution with neg phalen and tinel signs, wrist splint will be placed for comfort and to hopefully achieve symptom relief. Strength preserved. No neck pain on exam to suggest cervical impingement. Pt will f/u with his PCP for re-eval if pain does not continue to improve. Given cardiac hx and multiple anti-hypertensives, NSAIDs not recommended- will give ultram rx.         Shaaron Adler, [place] 03/20/12 [phone]

## 2012-03-21 NOTE — ED Provider Notes (Signed)
Medical screening examination/treatment/procedure(s) were performed by non-physician practitioner and as supervising physician I was immediately available for consultation/collaboration.  Laci Frenkel, MD 03/21/12 1910 

## 2012-03-23 ENCOUNTER — Telehealth: Payer: Self-pay | Admitting: *Deleted

## 2012-03-23 NOTE — Telephone Encounter (Signed)
03-23-12 lmm at 401pm for pt to set up past due defib ck/mt

## 2012-05-03 ENCOUNTER — Ambulatory Visit (HOSPITAL_COMMUNITY)
Admission: RE | Admit: 2012-05-03 | Discharge: 2012-05-03 | Disposition: A | Discharge status: Home / Self Care | Payer: Medicare Other | Source: Ambulatory Visit | Attending: Adult Health | Admitting: Adult Health

## 2012-05-03 ENCOUNTER — Encounter (HOSPITAL_COMMUNITY): Payer: Self-pay

## 2012-05-03 ENCOUNTER — Ambulatory Visit (HOSPITAL_BASED_OUTPATIENT_CLINIC_OR_DEPARTMENT_OTHER)
Admission: RE | Admit: 2012-05-03 | Discharge: 2012-05-03 | Disposition: A | Discharge status: Home / Self Care | Payer: Medicare Other | Source: Ambulatory Visit | Attending: Internal Medicine | Admitting: Internal Medicine

## 2012-05-03 VITALS — BP 130/60 | HR 76 | Ht 66.0 in | Wt 351.4 lb

## 2012-05-03 DIAGNOSIS — I059 Rheumatic mitral valve disease, unspecified: Secondary | ICD-10-CM

## 2012-05-03 DIAGNOSIS — E119 Type 2 diabetes mellitus without complications: Secondary | ICD-10-CM | POA: Insufficient documentation

## 2012-05-03 DIAGNOSIS — I5022 Chronic systolic (congestive) heart failure: Secondary | ICD-10-CM

## 2012-05-03 DIAGNOSIS — I079 Rheumatic tricuspid valve disease, unspecified: Secondary | ICD-10-CM | POA: Insufficient documentation

## 2012-05-03 DIAGNOSIS — J45909 Unspecified asthma, uncomplicated: Secondary | ICD-10-CM | POA: Insufficient documentation

## 2012-05-03 DIAGNOSIS — I509 Heart failure, unspecified: Secondary | ICD-10-CM

## 2012-05-03 DIAGNOSIS — I10 Essential (primary) hypertension: Secondary | ICD-10-CM | POA: Insufficient documentation

## 2012-05-03 MED ORDER — CARVEDILOL 12.5 MG PO TABS: 25.0000 mg | ORAL_TABLET | Freq: Two times a day (BID) | ORAL | Status: DC

## 2012-05-03 NOTE — Progress Notes (Signed)
PCP: Creola Corn EP: Gary Hutchinson  HPI:   Gary Hutchinson is a 51 year old male with a history of systolic heart failure, ejection fraction 25-30% per echo in July 2012, nonischemic cardiomyopathy,  hypertension, type 2 diabetes mellitus, OSA on CPAP, history of ventricular tachycardia status post AICD placement in 2009, and morbid obesity who presents to the HF clinic for post-hospital f/u.  7/12 [facility] Admitted and treated for volume overload with subsuquent re-admit the next week for volume overload. Sluggish response to IV Lasix and placed on Milrinone. D/C weight 151 kg.  Last visit Carvedilol increased 18.75 mg twice a day.  Spironolactone decreased to 12.5 daily due potassium 5.4   Echo 05/03/12: EF ~30%.  (reviewed personally in clinic)  He is here for follow up. He feels ok today.  Has had problems with gout over the last couple of months and he wasn't able to exercise.  He had increased stress, mother was sick and in the hospital.  Has restarted exercise over the last 6 days.  Walking on treadmill 10 minutes a day.  Trying to follow low sodium diet.  Denies SOB/PND/Orthopnea. Continues to use CPAP.   ROS: All systems negative except as listed in HPI, PMH and Problem List.  Past Medical History  Diagnosis Date  . Cardiomyopathy     nonischemic  . Ventricular tachycardia     s/p MDT ICD implant  . Alcohol abuse     now quit  . Pulmonary hypertension   . Diverticulosis of colon   . Hemorrhoids, internal   . Cholecystitis   . Dyspepsia   . Morbid obesity   . Obstructive sleep apnea   . Anemia     iron defi  . Hypertension   . Gout   . Diabetes mellitus   . Chronic systolic congestive heart failure   . Asthma     Current Outpatient Prescriptions  Medication Sig Dispense Refill  . albuterol (PROAIR HFA) 108 (90 BASE) MCG/ACT inhaler Inhale 2 puffs into the lungs every 6 (six) hours as needed. For wheezing or shortness of breath      . aspirin 325 MG tablet Take 162.5 mg by mouth  daily.      . carvedilol (COREG) 12.5 MG tablet Take 18.75 mg by mouth 2 (two) times daily with a meal. 1.5 tablet      . colchicine 0.6 MG tablet Take 0.6 mg by mouth daily as needed. Gout flare ups      . digoxin (LANOXIN) 0.25 MG tablet Take 0.5 tablets (125 mcg total) by mouth daily.      . Fluticasone-Salmeterol (ADVAIR DISKUS) 250-50 MCG/DOSE AEPB Inhale 1 puff into the lungs every 12 (twelve) hours as needed. For wheezing or shortness of breath      . glipizide-metformin (METAGLIP) 2.5-250 MG per tablet Take 1 tablet by mouth 2 (two) times daily before a meal.        . montelukast (SINGULAIR) 10 MG tablet Take 10 mg by mouth daily as needed. For shortness of breath or wheezing      . ramipril (ALTACE) 10 MG tablet Take 10 mg by mouth daily.        . simvastatin (ZOCOR) 40 MG tablet Take 40 mg by mouth at bedtime.        Marland Kitchen spironolactone (ALDACTONE) 25 MG tablet Take 12.5 mg by mouth daily.       Marland Kitchen torsemide (DEMADEX) 20 MG tablet Take 40 mg by mouth 2 (two) times daily.  PHYSICAL EXAM: Filed Vitals:   05/03/12 1102  BP: 130/60  Pulse: 76  Height: 5\' 6"  (1.676 m)  Weight: 351 lb 6.4 oz (159.394 kg)  SpO2: 99%   General:  Well appearing. No resp difficulty HEENT: normal Neck: supple. JVP flat. Carotids 2+ bilaterally; no bruits. No lymphadenopathy or thryomegaly appreciated. Cor: PMI nonpalpablel. Regular rate & rhythm. No rubs, gallops or 2/6 TR murmur. Lungs: clear Abdomen: obese, soft, nontender, nondistended.  Good bowel sounds. Extremities: no cyanosis, clubbing, rash, edema Neuro: alert & orientedx3, cranial nerves grossly intact. Moves all 4 extremities w/o difficulty. Affect pleasant.   ASSESSMENT & PLAN:

## 2012-05-03 NOTE — Assessment & Plan Note (Addendum)
NYHA II.  Volume status looks good.  Will increase carvedilol 25 mg BID.  Continue current regimen.  Patient seen and examined with Ulyess Blossom, [name]. We discussed all aspects of the encounter. I agree with the assessment and plan as stated above. Volume status looks good. Echo reviewed in clinic EF ~30%. Agree with increasing carvedilol. Will check Optivol. Encouraged him to get back on his exercise program and lose weight.

## 2012-05-03 NOTE — Progress Notes (Signed)
  Echocardiogram 2D Echocardiogram has been performed.  Gary Hutchinson 05/03/2012, 11:38 AM

## 2012-05-03 NOTE — Addendum Note (Signed)
Encounter addended by: Rocco Serene, RN on: 05/03/2012 12:03 PM<BR>     Documentation filed: Patient Instructions Section, Orders

## 2012-05-03 NOTE — Patient Instructions (Addendum)
Please increase your Carvedilol to 25 mg twice a day Continue all other medications as listed.  Follow up in 3 months with CHF clinic.

## 2012-05-12 ENCOUNTER — Encounter: Payer: Self-pay | Admitting: Internal Medicine

## 2012-06-09 ENCOUNTER — Other Ambulatory Visit (HOSPITAL_COMMUNITY): Payer: Self-pay | Admitting: *Deleted

## 2012-06-09 MED ORDER — DIGOXIN 250 MCG PO TABS: 125.0000 ug | ORAL_TABLET | Freq: Every day | ORAL | Status: DC

## 2012-08-02 ENCOUNTER — Ambulatory Visit (HOSPITAL_COMMUNITY): Payer: Medicare Other

## 2012-08-09 ENCOUNTER — Other Ambulatory Visit (HOSPITAL_COMMUNITY): Payer: Self-pay | Admitting: *Deleted

## 2012-08-09 ENCOUNTER — Ambulatory Visit (HOSPITAL_COMMUNITY)
Admission: RE | Admit: 2012-08-09 | Discharge: 2012-08-09 | Disposition: A | Discharge status: Home / Self Care | Payer: Medicare Other | Source: Ambulatory Visit | Attending: Internal Medicine | Admitting: Internal Medicine

## 2012-08-09 ENCOUNTER — Encounter (HOSPITAL_COMMUNITY): Payer: Self-pay

## 2012-08-09 VITALS — BP 130/77 | HR 115 | Resp 18 | Ht 66.0 in | Wt 350.4 lb

## 2012-08-09 DIAGNOSIS — I509 Heart failure, unspecified: Secondary | ICD-10-CM

## 2012-08-09 DIAGNOSIS — I5022 Chronic systolic (congestive) heart failure: Secondary | ICD-10-CM | POA: Insufficient documentation

## 2012-08-09 LAB — BASIC METABOLIC PANEL
BUN: 16 mg/dL (ref 6–23)
Calcium: 9.5 mg/dL (ref 8.4–10.5)
Creatinine, Ser: 1.15 mg/dL (ref 0.50–1.35)
GFR calc Af Amer: 83 mL/min — ABNORMAL LOW (ref >90)
GFR calc non Af Amer: 72 mL/min — ABNORMAL LOW (ref >90)
Glucose, Bld: 149 mg/dL — ABNORMAL HIGH (ref 70–99)
Potassium: 4.5 mEq/L (ref 3.5–5.1)

## 2012-08-09 MED ORDER — DIGOXIN 250 MCG PO TABS: 125.0000 ug | ORAL_TABLET | Freq: Every day | ORAL | Status: DC

## 2012-08-09 NOTE — Addendum Note (Signed)
Encounter addended by: [name], MD on: 08/09/2012  5:30 PM<BR>     Documentation filed: Charges VN

## 2012-08-09 NOTE — Assessment & Plan Note (Signed)
-   Well-controlled, continue current meds

## 2012-08-09 NOTE — Assessment & Plan Note (Addendum)
Have discussed need for weight loss.  He is going to start back on his work out regimen.   Patient seen and examined with Ulyess Blossom, [name]. We discussed all aspects of the encounter. I agree with the assessment and plan as stated above.  Once again reinforced need for weight loss efforts.

## 2012-08-09 NOTE — Patient Instructions (Addendum)
We will contact you in 3 months to schedule your next appointment.  

## 2012-08-09 NOTE — Progress Notes (Signed)
PCP: Creola Corn EP: Hillis Range  HPI:   Mr. Fortson is a 51 year old male with a history of systolic heart failure, ejection fraction 25-30% per echo in July 2012, nonischemic cardiomyopathy,  hypertension, type 2 diabetes mellitus, OSA on CPAP, history of ventricular tachycardia status post AICD placement in 2009, and morbid obesity who presents to the HF clinic for post-hospital f/u.  7/12 [facility] Admitted and treated for volume overload with subsuquent re-admit the next week for volume overload. Sluggish response to IV Lasix and placed on Milrinone. D/C weight 151 kg.  Last visit Carvedilol increased 18.75 mg twice a day.  Spironolactone decreased to 12.5 daily due potassium 5.4   Echo 05/03/12: EF 25-30%.  Grade 1 diastolic dysfunction.  Mild MR.  Mod dilated LA.    He returns for follow up.  He feels pretty good but has a cold.  He is taking mucinex and cough drops and cough is improving.  He denies orthopnea, PND.    Weight at home 339 pounds.  Relatives living at his house causing some stress.  Not walking as much on treadmill because they are living in that room.  Diet is not as tight as it has been. Using CPAP but doesn't always stay on all night.  No edema.  Compliant with meds.   Optivol: fluid index trending up but has not crossed threshold.  Activity level 4-6 hours.     ROS: All systems negative except as listed in HPI, PMH and Problem List.  Past Medical History  Diagnosis Date  . Cardiomyopathy     nonischemic  . Ventricular tachycardia     s/p MDT ICD implant  . Alcohol abuse     now quit  . Pulmonary hypertension   . Diverticulosis of colon   . Hemorrhoids, internal   . Cholecystitis   . Dyspepsia   . Morbid obesity   . Obstructive sleep apnea   . Anemia     iron defi  . Hypertension   . Gout   . Diabetes mellitus   . Chronic systolic congestive heart failure   . Asthma     Current Outpatient Prescriptions  Medication Sig Dispense Refill  . albuterol (PROAIR  HFA) 108 (90 BASE) MCG/ACT inhaler Inhale 2 puffs into the lungs every 6 (six) hours as needed. For wheezing or shortness of breath      . allopurinol (ZYLOPRIM) 100 MG tablet Take 1 tablet by mouth daily.      Marland Kitchen aspirin 325 MG tablet Take 162.5 mg by mouth daily.      . carvedilol (COREG) 12.5 MG tablet Take 2 tablets (25 mg total) by mouth 2 (two) times daily with a meal. 1.5 tablet  60 tablet  6  . colchicine 0.6 MG tablet Take 0.6 mg by mouth daily as needed. Gout flare ups      . digoxin (LANOXIN) 0.25 MG tablet Take 0.5 tablets (125 mcg total) by mouth daily.  30 tablet  6  . fluticasone (FLONASE) 50 MCG/ACT nasal spray       . Fluticasone-Salmeterol (ADVAIR DISKUS) 250-50 MCG/DOSE AEPB Inhale 1 puff into the lungs every 12 (twelve) hours as needed. For wheezing or shortness of breath      . glipizide-metformin (METAGLIP) 2.5-250 MG per tablet Take 1 tablet by mouth 2 (two) times daily before a meal.        . ramipril (ALTACE) 10 MG tablet Take 10 mg by mouth daily.        Marland Kitchen  simvastatin (ZOCOR) 40 MG tablet Take 40 mg by mouth at bedtime.        Marland Kitchen spironolactone (ALDACTONE) 25 MG tablet Take 12.5 mg by mouth daily.       Marland Kitchen torsemide (DEMADEX) 20 MG tablet Take 40 mg by mouth 2 (two) times daily.       . traMADol (ULTRAM) 50 MG tablet Take 50 mg by mouth every 6 (six) hours as needed.       . montelukast (SINGULAIR) 10 MG tablet Take 10 mg by mouth daily as needed. For shortness of breath or wheezing      . [DISCONTINUED] digoxin (LANOXIN) 0.25 MG tablet Take 0.5 tablets (125 mcg total) by mouth daily.  30 tablet  6    PHYSICAL EXAM: Filed Vitals:   08/09/12 1525  BP: 130/77  Pulse: 115  Resp: 18  Height: 5\' 6"  (1.676 m)  Weight: 350 lb 6.4 oz (158.94 kg)  SpO2: 97%   General:  Well appearing. No resp difficulty HEENT: normal Neck: supple. JVP flat. Carotids 2+ bilaterally; no bruits. No lymphadenopathy or thryomegaly appreciated. Cor: PMI nonpalpablel. Regular rate & rhythm. No  rubs, gallops or 2/6 TR murmur. Lungs: clear Abdomen: obese, soft, nontender, nondistended.  Good bowel sounds. Extremities: no cyanosis, clubbing, rash, edema Neuro: alert & orientedx3, cranial nerves grossly intact. Moves all 4 extremities w/o difficulty. Affect pleasant.   ASSESSMENT & PLAN:

## 2012-08-09 NOTE — Assessment & Plan Note (Addendum)
Fluid status difficult to judge but optival fluid index trending up.  Will have him take extra 40 mg of demadex.  He will continue to watch weight closely, have discussed sliding scale demadex and he voices understanding.  BMET today.  Patient seen and examined with Ulyess Blossom, [name]. We discussed all aspects of the encounter. I agree with the assessment and plan as stated above.  Overall his HF is stable. Reinforced need for daily weights and reviewed use of sliding scale diuretics. We reviewed his Optivol tracings together and volume status trending up. Will take extra demadex today. We have set him up for home Optivol monitoring.

## 2012-09-10 ENCOUNTER — Ambulatory Visit: Payer: Medicare Other | Admitting: Pulmonary Disease

## 2012-09-27 ENCOUNTER — Encounter: Payer: Self-pay | Admitting: Internal Medicine

## 2012-10-05 ENCOUNTER — Encounter: Payer: Self-pay | Admitting: *Deleted

## 2012-10-08 ENCOUNTER — Ambulatory Visit: Payer: Medicare Other | Admitting: Pulmonary Disease

## 2012-10-12 ENCOUNTER — Other Ambulatory Visit: Payer: Self-pay | Admitting: Internal Medicine

## 2012-10-12 NOTE — Telephone Encounter (Signed)
KCL was d/c'd in Oct 2012 due to elevated K, K level has been 4.5 and 4.3 past 2 times it has been checked, have called pt to clarify and Left message to call back

## 2012-10-12 NOTE — Telephone Encounter (Signed)
[facility] [phone] called with a refill request for potassium chloride SA (K-DUR,KLOR-CON) 20 MEQ tablet but this was DISCONTINUED 05/03/12, pt's last several labs (Bmet) showed potassium levels very high.  Pharmacy stated pt has continually been refilling his potassium chloride SA (K-DUR,KLOR-CON) 20 MEQ tablet.  Please advise [facility] [phone] on refill request.  Caralee Ates, CMA

## 2012-10-13 ENCOUNTER — Other Ambulatory Visit: Payer: Self-pay | Admitting: *Deleted

## 2012-10-13 NOTE — Telephone Encounter (Signed)
PER PATIENT HE NO LONGER TAKE K+ (BEEN OFF OF LITTLE OVER A YEAR) AND HE IS NO LONGER A PATIENT OF DR. NAHSER. PER PATIENT HE IS WITH DR. Gala Romney NO. PER PATIENT HE CALLED PHARMACY AND INFORMED THEM.

## 2012-10-21 ENCOUNTER — Ambulatory Visit: Payer: Medicare Other | Admitting: Pulmonary Disease

## 2012-10-28 NOTE — Telephone Encounter (Signed)
Pt has not been taking K and pharmacy is aware

## 2012-11-11 ENCOUNTER — Ambulatory Visit (INDEPENDENT_AMBULATORY_CARE_PROVIDER_SITE_OTHER): Payer: Medicare Other | Admitting: Pulmonary Disease

## 2012-11-11 ENCOUNTER — Encounter: Payer: Self-pay | Admitting: Pulmonary Disease

## 2012-11-11 VITALS — BP 132/80 | HR 106 | Temp 97.6°F | Ht 65.5 in | Wt 350.0 lb

## 2012-11-11 DIAGNOSIS — G4733 Obstructive sleep apnea (adult) (pediatric): Secondary | ICD-10-CM

## 2012-11-11 NOTE — Patient Instructions (Addendum)
Continue with your bipap, and keep up with mask changes and supplies Work on weight loss Can try over the counter zyrtec/claritin/allegra at bedtime to see if helps with postnasal drip followup with me in one year

## 2012-11-11 NOTE — Progress Notes (Signed)
  Subjective:    Patient ID: Gary Hutchinson, male    DOB: [birth year], 52 y.o.   MRN: [id number]  HPI Patient comes in today for followup of his known obstructive sleep apnea.  He is wearing CPAP compliantly, and is having no issues with his mask fit or pressure.  He is sleeping well, and feels that his daytime alertness is adequate.  He is overdue for a new mask, as well as supplies, and is going to his medical equipment company today for these.  Of note, his weight has increased since his last visit.    Review of Systems  Constitutional: Negative for fever and unexpected weight change.  HENT: Negative for ear pain, nosebleeds, congestion, sore throat, rhinorrhea, sneezing, trouble swallowing, dental problem, postnasal drip and sinus pressure.   Eyes: Negative for redness and itching.  Respiratory: Negative for cough, chest tightness, shortness of breath and wheezing.   Cardiovascular: Negative for palpitations and leg swelling.  Gastrointestinal: Negative for nausea and vomiting.  Genitourinary: Negative for dysuria.  Musculoskeletal: Negative for joint swelling.  Skin: Negative for rash.  Neurological: Negative for headaches.  Hematological: Does not bruise/bleed easily.  Psychiatric/Behavioral: Negative for dysphoric mood. The patient is not nervous/anxious.        Objective:   Physical Exam Obese male in no acute distress No skin breakdown or pressure necrosis from the CPAP mask Nose without purulence or discharge noted Neck without lymphadenopathy or thyromegaly Lower extremities with mild edema, no cyanosis Alert and oriented, does not appear to be sleepy, moves all 4 extremities.        Assessment & Plan:

## 2012-11-11 NOTE — Assessment & Plan Note (Signed)
The patient is doing well currently on his bilevel device.  He feels that he is sleeping well, and has adequate daytime alertness.  I have stressed to him the importance of weight loss, and asked him to keep up with his mask changes and supplies.  He is to followup with me in one year if doing well.

## 2012-12-02 ENCOUNTER — Encounter (HOSPITAL_COMMUNITY): Payer: Medicare Other

## 2012-12-07 ENCOUNTER — Other Ambulatory Visit (HOSPITAL_COMMUNITY): Payer: Self-pay | Admitting: *Deleted

## 2012-12-07 MED ORDER — CARVEDILOL 25 MG PO TABS: 25.0000 mg | ORAL_TABLET | Freq: Two times a day (BID) | ORAL | Status: DC

## 2012-12-08 ENCOUNTER — Other Ambulatory Visit (HOSPITAL_COMMUNITY): Payer: Self-pay | Admitting: *Deleted

## 2012-12-08 MED ORDER — CARVEDILOL 25 MG PO TABS: 25.0000 mg | ORAL_TABLET | Freq: Two times a day (BID) | ORAL | Status: DC

## 2012-12-15 ENCOUNTER — Ambulatory Visit (HOSPITAL_COMMUNITY)
Admission: RE | Admit: 2012-12-15 | Discharge: 2012-12-15 | Disposition: A | Discharge status: Home / Self Care | Payer: Medicare Other | Source: Ambulatory Visit | Attending: Internal Medicine | Admitting: Internal Medicine

## 2012-12-15 VITALS — BP 126/78 | HR 104 | Wt 368.0 lb

## 2012-12-15 DIAGNOSIS — I509 Heart failure, unspecified: Secondary | ICD-10-CM

## 2012-12-15 DIAGNOSIS — I5022 Chronic systolic (congestive) heart failure: Secondary | ICD-10-CM | POA: Insufficient documentation

## 2012-12-15 NOTE — Patient Instructions (Addendum)
Start weighing again.  Increase activity level.  Follow up 2 months.

## 2012-12-15 NOTE — Progress Notes (Addendum)
PCP: Creola Corn EP: Hillis Range  HPI:   Gary Hutchinson is a 52 year old male with a history of systolic heart failure, ejection fraction 25-30% per echo in July 2012, nonischemic cardiomyopathy,  hypertension, type 2 diabetes mellitus, OSA on CPAP, history of ventricular tachycardia status post AICD placement in 2009, and morbid obesity who presents to the HF clinic for post-hospital f/u.  7/12 [facility] Admitted and treated for volume overload with subsuquent re-admit the next week for volume overload. Sluggish response to IV Lasix and placed on Milrinone. D/C weight 151 kg.  Spironolactone decreased to 12.5 daily due potassium 5.4   Echo 05/03/12: EF 25-30%.  Grade 1 diastolic dysfunction.  Mild MR.  Mod dilated LA.    He returns for follow up today.  He feels ok.  He ran out of carvedilol and torsemide for 2 days.  Socially having some difficult time due to sharing house with other family.  Power out for 4 days.  Not weighing himself.  He is wearing CPAP almost every night.   He denies orthopnea, PND.    Optivol low but starting to trend back up.  Activity 4 hours   ROS: All systems negative except as listed in HPI, PMH and Problem List.  Past Medical History  Diagnosis Date  . Cardiomyopathy     nonischemic  . Ventricular tachycardia     s/p MDT ICD implant  . Alcohol abuse     now quit  . Pulmonary hypertension   . Diverticulosis of colon   . Hemorrhoids, internal   . Cholecystitis   . Dyspepsia   . Morbid obesity   . Obstructive sleep apnea   . Anemia     iron defi  . Hypertension   . Gout   . Diabetes mellitus   . Chronic systolic congestive heart failure   . Asthma     Current Outpatient Prescriptions  Medication Sig Dispense Refill  . albuterol (PROAIR HFA) 108 (90 BASE) MCG/ACT inhaler Inhale 2 puffs into the lungs every 6 (six) hours as needed. For wheezing or shortness of breath      . allopurinol (ZYLOPRIM) 100 MG tablet Take 1 tablet by mouth daily.      Marland Kitchen aspirin  325 MG tablet Take 162.5 mg by mouth daily.      . carvedilol (COREG) 25 MG tablet Take 1 tablet (25 mg total) by mouth 2 (two) times daily with a meal.  60 tablet  3  . colchicine 0.6 MG tablet Take 0.6 mg by mouth daily as needed. Gout flare ups      . digoxin (LANOXIN) 0.25 MG tablet Take 0.5 tablets (125 mcg total) by mouth daily.  30 tablet  6  . fluticasone (FLONASE) 50 MCG/ACT nasal spray Place 2 sprays into the nose as needed.       . Fluticasone-Salmeterol (ADVAIR DISKUS) 250-50 MCG/DOSE AEPB Inhale 1 puff into the lungs every 12 (twelve) hours as needed. For wheezing or shortness of breath      . glipizide-metformin (METAGLIP) 2.5-250 MG per tablet Take 1 tablet by mouth 2 (two) times daily before a meal.        . montelukast (SINGULAIR) 10 MG tablet Take 10 mg by mouth daily as needed. For shortness of breath or wheezing      . ramipril (ALTACE) 10 MG tablet Take 10 mg by mouth daily.        . simvastatin (ZOCOR) 40 MG tablet Take 40 mg by mouth at bedtime.        Marland Kitchen  spironolactone (ALDACTONE) 25 MG tablet Take 12.5 mg by mouth daily.       Marland Kitchen torsemide (DEMADEX) 20 MG tablet Take 40 mg by mouth 2 (two) times daily.       . traMADol (ULTRAM) 50 MG tablet Take 50 mg by mouth every 6 (six) hours as needed.        No current facility-administered medications for this encounter.    PHYSICAL EXAM: Filed Vitals:   12/15/12 0958  BP: 126/78  Pulse: 104  Weight: 368 lb (166.924 kg)  SpO2: 99%   General:  Well appearing. No resp difficulty HEENT: normal Neck: supple. JVP hard to see but appears flat. Carotids 2+ bilaterally; no bruits. No lymphadenopathy or thryomegaly appreciated. Cor: PMI nonpalpablel. Regular rate & rhythm. No rubs, gallops or 2/6 TR murmur. Lungs: clear Abdomen: obese, soft, nontender, nondistended.  Good bowel sounds. Extremities: no cyanosis, clubbing, rash, edema Neuro: alert & orientedx3, cranial nerves grossly intact. Moves all 4 extremities w/o difficulty.  Affect pleasant.   ASSESSMENT & PLAN:

## 2012-12-15 NOTE — Assessment & Plan Note (Addendum)
Increased dyspnea but not related to fluid overload most likely related to obesity.  Have discussed need to lose weight and begin to exercise again.  He will start weighing himself daily.    Patient seen and examined with Ulyess Blossom, [name]. We discussed all aspects of the encounter. I agree with the assessment and plan as stated above. Weight continues to increase but volume status looks fine on exam and by Optivol. Reinforced need for weight loss as well as daily weights and reviewed use of sliding scale diuretics.

## 2012-12-29 ENCOUNTER — Encounter (HOSPITAL_COMMUNITY): Payer: Self-pay | Admitting: Emergency Medicine

## 2012-12-29 ENCOUNTER — Emergency Department (INDEPENDENT_AMBULATORY_CARE_PROVIDER_SITE_OTHER)
Admission: EM | Admit: 2012-12-29 | Discharge: 2012-12-29 | Disposition: A | Discharge status: Home / Self Care | Payer: Medicare Other | Source: Home / Self Care | Attending: Family Medicine | Admitting: Family Medicine

## 2012-12-29 DIAGNOSIS — K047 Periapical abscess without sinus: Secondary | ICD-10-CM

## 2012-12-29 MED ORDER — PENICILLIN V POTASSIUM 500 MG PO TABS: 500.0000 mg | ORAL_TABLET | Freq: Four times a day (QID) | ORAL | Status: AC

## 2012-12-29 MED ORDER — TRAMADOL HCL 50 MG PO TABS: 50.0000 mg | ORAL_TABLET | Freq: Three times a day (TID) | ORAL | Status: DC | PRN

## 2012-12-29 MED ORDER — HYDROCODONE-ACETAMINOPHEN 5-325 MG PO TABS: 2.0000 | ORAL_TABLET | ORAL | Status: DC | PRN

## 2012-12-29 MED ORDER — METRONIDAZOLE 500 MG PO TABS: 500.0000 mg | ORAL_TABLET | Freq: Two times a day (BID) | ORAL | Status: DC

## 2012-12-29 NOTE — ED Notes (Addendum)
Pt c/o dental pain on and off for the past one year. Reports two teeth on upper right side have broken off. Gums feel like they are inflammed. Used to go to [facility] for assistance but no longer has insurance for dental care. Has tried to rinse mouth with warm salt water and tylenol with mild relief. Patient is alert and oriented with no signs of distress.

## 2012-12-29 NOTE — ED Provider Notes (Signed)
History     CSN: [id number]  Arrival date & time 12/29/12  1023   First MD Initiated Contact with Patient 12/29/12 1025      Chief Complaint  Patient presents with  . Dental Pain    (Consider location/radiation/quality/duration/timing/severity/associated sxs/prior treatment) HPI Comments: 52 year old male with history of diabetes and congestive heart failure among other chronic comorbidities. Here complaining of right upper dental pain intermittently for the last 2 years but with exacerbation associated with throbbing sensation in her gum and swelling in the last 2 days. Denies fever or chills. Patient reports diabetes is well-controlled with random blood sugar levels in the 200 range at home. Has taken tramadol with no significant relief.   Past Medical History  Diagnosis Date  . Cardiomyopathy     nonischemic  . Ventricular tachycardia     s/p MDT ICD implant  . Alcohol abuse     now quit  . Pulmonary hypertension   . Diverticulosis of colon   . Hemorrhoids, internal   . Cholecystitis   . Dyspepsia   . Morbid obesity   . Obstructive sleep apnea   . Anemia     iron defi  . Hypertension   . Gout   . Diabetes mellitus   . Chronic systolic congestive heart failure   . Asthma     Past Surgical History  Procedure Laterality Date  . Icd implantation      ICD- [employer] 9/09  . Cardiac catheterization  03/08/2004    EF 25-30%  . US echocardiography  08/22/2008    EF 30-35%  . Transthoracic echocardiogram  05/27/2008    EF 30-35%    Family History  Problem Relation Age of Onset  . Cancer Father   . Diabetes      grandparents  . Hypertension Mother     History  Substance Use Topics  . Smoking status: Never Smoker   . Smokeless tobacco: Not on file  . Alcohol Use: No     Comment: former heavy ETOH, quit      Review of Systems  Constitutional: Negative for fever, chills and fatigue.  HENT: Positive for dental problem. Negative for facial swelling.    Respiratory: Negative for chest tightness and shortness of breath.   Cardiovascular: Negative for chest pain.  Gastrointestinal: Negative for nausea, vomiting and abdominal pain.  Neurological: Negative for headaches.    Allergies  Review of patient's allergies indicates no known allergies.  Home Medications   Current Outpatient Rx  Name  Route  Sig  Dispense  Refill  . aspirin 81 MG tablet   Oral   Take 81 mg by mouth daily.         . carvedilol (COREG) 25 MG tablet   Oral   Take 1 tablet (25 mg total) by mouth 2 (two) times daily with a meal.   60 tablet   3   . digoxin (LANOXIN) 0.25 MG tablet   Oral   Take 0.5 tablets (125 mcg total) by mouth daily.   30 tablet   6   . glipizide-metformin (METAGLIP) 2.5-250 MG per tablet   Oral   Take 1 tablet by mouth 2 (two) times daily before a meal.           . ramipril (ALTACE) 10 MG tablet   Oral   Take 10 mg by mouth daily.           . simvastatin (ZOCOR) 40 MG tablet   Oral   Take  40 mg by mouth at bedtime.           Marland Kitchen spironolactone (ALDACTONE) 25 MG tablet   Oral   Take 12.5 mg by mouth daily.          Marland Kitchen torsemide (DEMADEX) 20 MG tablet   Oral   Take 40 mg by mouth 2 (two) times daily.          Marland Kitchen albuterol (PROAIR HFA) 108 (90 BASE) MCG/ACT inhaler   Inhalation   Inhale 2 puffs into the lungs every 6 (six) hours as needed. For wheezing or shortness of breath         . allopurinol (ZYLOPRIM) 100 MG tablet   Oral   Take 1 tablet by mouth daily.         Marland Kitchen aspirin 325 MG tablet   Oral   Take 81 mg by mouth daily.          . colchicine 0.6 MG tablet   Oral   Take 0.6 mg by mouth daily as needed. Gout flare ups         . fluticasone (FLONASE) 50 MCG/ACT nasal spray   Nasal   Place 2 sprays into the nose as needed.          . Fluticasone-Salmeterol (ADVAIR DISKUS) 250-50 MCG/DOSE AEPB   Inhalation   Inhale 1 puff into the lungs every 12 (twelve) hours as needed. For wheezing or  shortness of breath         . HYDROcodone-acetaminophen (NORCO/VICODIN) 5-325 MG per tablet   Oral   Take 2 tablets by mouth every 4 (four) hours as needed for pain.   6 tablet   0   . metroNIDAZOLE (FLAGYL) 500 MG tablet   Oral   Take 1 tablet (500 mg total) by mouth 2 (two) times daily.   14 tablet   0   . montelukast (SINGULAIR) 10 MG tablet   Oral   Take 10 mg by mouth daily as needed. For shortness of breath or wheezing         . penicillin v potassium (VEETID) 500 MG tablet   Oral   Take 1 tablet (500 mg total) by mouth 4 (four) times daily.   40 tablet   0   . traMADol (ULTRAM) 50 MG tablet   Oral   Take 1 tablet (50 mg total) by mouth every 8 (eight) hours as needed.   20 tablet   0     BP 89/63  Pulse 85  Temp(Src) 98.1 F (36.7 C) (Oral)  Resp 18  SpO2 96%  Physical Exam  Nursing note and vitals reviewed. Constitutional: He is oriented to person, place, and time. He appears well-developed and well-nourished. No distress.  HENT:  Head: Normocephalic and atraumatic.  Gum swelling around upper right posterior molar also associated with tooth cavity.  Cardiovascular: Normal heart sounds.   Pulmonary/Chest: Breath sounds normal.  Lymphadenopathy:    He has no cervical adenopathy.  Neurological: He is alert and oriented to person, place, and time.  Skin: He is not diaphoretic.    ED Course  Procedures (including critical care time)  Labs Reviewed - No data to display No results found.   1. Dental abscess       MDM  Prescribed penicillin and Flagyl. Also refilled tramadol and prescribed Norco #6 pills. Instructed not to take tramadol and Norco combined. Dental referral and resources provided. Supportive care and red flags should prompt his return to medical  attention discussed with patient and provided in writing.   Sharin Grave, MD 12/31/12 [phone]

## 2013-01-11 ENCOUNTER — Telehealth: Payer: Self-pay | Admitting: Internal Medicine

## 2013-01-11 NOTE — Telephone Encounter (Signed)
New Prob   Pt called in wanting to know if he still needs to see Dr. [name], he has been seeing Dr. Gala Romney. Would like to speak to nurse.

## 2013-01-12 NOTE — Telephone Encounter (Signed)
Only needs to see Allred yearly and continue doing remotes every 3 months

## 2013-01-17 ENCOUNTER — Ambulatory Visit (HOSPITAL_COMMUNITY)
Admission: RE | Admit: 2013-01-17 | Discharge: 2013-01-17 | Disposition: A | Discharge status: Home / Self Care | Payer: Medicare Other | Source: Ambulatory Visit | Attending: Internal Medicine | Admitting: Internal Medicine

## 2013-01-17 DIAGNOSIS — I5022 Chronic systolic (congestive) heart failure: Secondary | ICD-10-CM

## 2013-01-17 DIAGNOSIS — I509 Heart failure, unspecified: Secondary | ICD-10-CM

## 2013-01-17 NOTE — Progress Notes (Signed)
Heart Failure Diagnostics Follow Up Form - [employer]    Fluid Index        ___   Fluid index trending with baseline       ___   Fluid index trending up but below threshold       _X__   Fluid index above threshold       ___   More than 3 threshold crossings this year        Current diuretic dose: .     Tosemide 40 mg Twice daily                                                                                              Baseline weight: .                                      Current Weight: .                                     Patient Activity        Current activity level: .          About 2 hours daily                                                Baseline activity level: .         About 4 hours daily                                                 AT/AF Burden        ___  Increase in AT/AF burden       ___  New onset of AT/AF       ___  No AT/AF         ICD Therapies        ___ 1 or more ICD shocks    Interventions       _X__  Adjust medications: .      Per Dr Milas Kocher double Torsemide for 2 days                                                                                             ___  Labs: .  ___  Need for follow up appointment: .                                                                                _X__  HF diagnostics follow up: .    Resend on Thursday 01/20/13                                                                                      ___  Review with EP: Marland Kitchen                                                                                                         Patient notified: Date: .    01/17/13                            By: .   Juliann Pares    Phone   .       Letter

## 2013-01-24 ENCOUNTER — Telehealth (HOSPITAL_COMMUNITY): Payer: Self-pay | Admitting: *Deleted

## 2013-01-24 ENCOUNTER — Encounter (HOSPITAL_COMMUNITY): Payer: Self-pay | Admitting: *Deleted

## 2013-01-24 NOTE — Telephone Encounter (Signed)
Pt resent an optivol transmission which did show improvement from 4/14 pt is aware and states he is feeling better, will recheck at next OV 5/14 and pt sch for next home transmission on 6/16

## 2013-01-26 ENCOUNTER — Encounter (HOSPITAL_COMMUNITY): Payer: Medicare Other

## 2013-02-04 ENCOUNTER — Encounter: Payer: Self-pay | Admitting: Internal Medicine

## 2013-02-09 ENCOUNTER — Encounter: Payer: Self-pay | Admitting: Internal Medicine

## 2013-02-09 ENCOUNTER — Other Ambulatory Visit: Payer: Self-pay | Admitting: Internal Medicine

## 2013-02-09 ENCOUNTER — Ambulatory Visit (INDEPENDENT_AMBULATORY_CARE_PROVIDER_SITE_OTHER): Payer: Medicare Other | Admitting: *Deleted

## 2013-02-09 DIAGNOSIS — I509 Heart failure, unspecified: Secondary | ICD-10-CM

## 2013-02-09 DIAGNOSIS — I472 Ventricular tachycardia: Secondary | ICD-10-CM

## 2013-02-09 DIAGNOSIS — I5022 Chronic systolic (congestive) heart failure: Secondary | ICD-10-CM

## 2013-02-09 LAB — ICD DEVICE OBSERVATION
BATTERY VOLTAGE: 3.04 V
HV IMPEDENCE: 90 Ohm
RV LEAD AMPLITUDE: 7.2 mv
RV LEAD IMPEDENCE ICD: 384 Ohm
TZAT-0002SLOWVT: NEGATIVE
TZAT-0018SLOWVT: NEGATIVE
TZAT-0019FASTVT: 8 V
TZAT-0019SLOWVT: 8 V
TZAT-0020FASTVT: 1.5 ms
TZAT-0020SLOWVT: 1.5 ms
TZON-0003SLOWVT: 400 ms
TZON-0005SLOWVT: 12
TZST-0001FASTVT: 2
TZST-0001FASTVT: 3
TZST-0001SLOWVT: 3
TZST-0001SLOWVT: 5
TZST-0001SLOWVT: 6
TZST-0002FASTVT: NEGATIVE
TZST-0002FASTVT: NEGATIVE
TZST-0002SLOWVT: NEGATIVE
TZST-0002SLOWVT: NEGATIVE
VENTRICULAR PACING ICD: 0.1 pct

## 2013-02-09 NOTE — Progress Notes (Signed)
icd check in clinic. Normal device function. ROV in 3 mths w/JA.

## 2013-02-16 ENCOUNTER — Encounter: Payer: Self-pay | Admitting: Internal Medicine

## 2013-02-16 ENCOUNTER — Ambulatory Visit (HOSPITAL_COMMUNITY)
Admission: RE | Admit: 2013-02-16 | Discharge: 2013-02-16 | Disposition: A | Discharge status: Home / Self Care | Payer: Medicare Other | Source: Ambulatory Visit | Attending: Internal Medicine | Admitting: Internal Medicine

## 2013-02-16 ENCOUNTER — Encounter (HOSPITAL_COMMUNITY): Payer: Self-pay

## 2013-02-16 VITALS — BP 122/80 | HR 96 | Wt 358.5 lb

## 2013-02-16 DIAGNOSIS — E119 Type 2 diabetes mellitus without complications: Secondary | ICD-10-CM | POA: Insufficient documentation

## 2013-02-16 DIAGNOSIS — I428 Other cardiomyopathies: Secondary | ICD-10-CM | POA: Insufficient documentation

## 2013-02-16 DIAGNOSIS — I509 Heart failure, unspecified: Secondary | ICD-10-CM

## 2013-02-16 DIAGNOSIS — I5022 Chronic systolic (congestive) heart failure: Secondary | ICD-10-CM | POA: Insufficient documentation

## 2013-02-16 DIAGNOSIS — I10 Essential (primary) hypertension: Secondary | ICD-10-CM | POA: Insufficient documentation

## 2013-02-16 DIAGNOSIS — G4733 Obstructive sleep apnea (adult) (pediatric): Secondary | ICD-10-CM | POA: Insufficient documentation

## 2013-02-16 NOTE — Progress Notes (Signed)
Patient ID: Gary Hutchinson, male   DOB: 10/10/1960, 52 y.o.   MRN: [id number] PCP: Creola Corn EP: Hillis Range  HPI:   Gary Hutchinson is a 52 year old male with a history of systolic heart failure, ejection fraction 25-30% per echo in July 2012, nonischemic cardiomyopathy,  hypertension, type 2 diabetes mellitus, OSA on CPAP, history of ventricular tachycardia status post AICD placement in 2009, and morbid obesity who presents to the HF clinic for post-hospital f/u.  7/12 [facility] Admitted and treated for volume overload with subsuquent re-admit the next week for volume overload. Sluggish response to IV Lasix and placed on Milrinone. D/C weight 151 kg.  Spironolactone decreased to 12.5 daily due potassium 5.4   Echo 05/03/12: EF 25-30%.  Grade 1 diastolic dysfunction.  Mild MR.  Mod dilated LA.    He returns for follow up today.  He feels ok.  He ran out of carvedilol and torsemide for 2 days.  Socially having some difficult time due to sharing house with other family.  Power out for 4 days.  Not weighing himself routinely as his scale is in his spare room where his niece is living.  He is wearing CPAP almost every night.   He denies orthopnea, PND.  Weight down down 10 pounds but still up 40 pounds from lowest weight a few years ago.   Optivol low but starting to trend back up.  Activity 3-4 hours. No AF.    ROS: All systems negative except as listed in HPI, PMH and Problem List.  Past Medical History  Diagnosis Date  . Cardiomyopathy     nonischemic  . Ventricular tachycardia     s/p MDT ICD implant  . Alcohol abuse     now quit  . Pulmonary hypertension   . Diverticulosis of colon   . Hemorrhoids, internal   . Cholecystitis   . Dyspepsia   . Morbid obesity   . Obstructive sleep apnea   . Anemia     iron defi  . Hypertension   . Gout   . Diabetes mellitus   . Chronic systolic congestive heart failure   . Asthma     Current Outpatient Prescriptions  Medication Sig Dispense Refill   . albuterol (PROAIR HFA) 108 (90 BASE) MCG/ACT inhaler Inhale 2 puffs into the lungs every 6 (six) hours as needed. For wheezing or shortness of breath      . allopurinol (ZYLOPRIM) 100 MG tablet Take 1 tablet by mouth daily.      Marland Kitchen aspirin 325 MG tablet Take 81 mg by mouth daily.       Marland Kitchen aspirin 81 MG tablet Take 81 mg by mouth daily.      . carvedilol (COREG) 25 MG tablet Take 1 tablet (25 mg total) by mouth 2 (two) times daily with a meal.  60 tablet  3  . colchicine 0.6 MG tablet Take 0.6 mg by mouth daily as needed. Gout flare ups      . digoxin (LANOXIN) 0.25 MG tablet Take 0.5 tablets (125 mcg total) by mouth daily.  30 tablet  6  . fluticasone (FLONASE) 50 MCG/ACT nasal spray Place 2 sprays into the nose as needed.       . Fluticasone-Salmeterol (ADVAIR DISKUS) 250-50 MCG/DOSE AEPB Inhale 1 puff into the lungs every 12 (twelve) hours as needed. For wheezing or shortness of breath      . glipizide-metformin (METAGLIP) 2.5-250 MG per tablet Take 1 tablet by mouth 2 (two) times daily  before a meal.        . HYDROcodone-acetaminophen (NORCO/VICODIN) 5-325 MG per tablet Take 2 tablets by mouth every 4 (four) hours as needed for pain.  6 tablet  0  . metroNIDAZOLE (FLAGYL) 500 MG tablet Take 1 tablet (500 mg total) by mouth 2 (two) times daily.  14 tablet  0  . montelukast (SINGULAIR) 10 MG tablet Take 10 mg by mouth daily as needed. For shortness of breath or wheezing      . ramipril (ALTACE) 10 MG tablet Take 10 mg by mouth daily.        . simvastatin (ZOCOR) 40 MG tablet Take 40 mg by mouth at bedtime.        Marland Kitchen spironolactone (ALDACTONE) 25 MG tablet Take 12.5 mg by mouth daily.       Marland Kitchen torsemide (DEMADEX) 20 MG tablet Take 40 mg by mouth 2 (two) times daily.       . traMADol (ULTRAM) 50 MG tablet Take 1 tablet (50 mg total) by mouth every 8 (eight) hours as needed.  20 tablet  0   No current facility-administered medications for this encounter.    PHYSICAL EXAM: Filed Vitals:    02/16/13 1006  BP: 122/80  Pulse: 118  Weight: 358 lb 8 oz (162.615 kg)  SpO2: 95%   General:  Well appearing. No resp difficulty HEENT: normal Neck: supple. JVP hard to see but appears flat. Carotids 2+ bilaterally; no bruits. No lymphadenopathy or thryomegaly appreciated. Cor: PMI nonpalpablel. Regular rate & rhythm. No rubs, gallops or 2/6 TR murmur. Lungs: clear Abdomen: obese, soft, nontender, nondistended.  Good bowel sounds. Extremities: no cyanosis, clubbing, rash, edema Neuro: alert & orientedx3, cranial nerves grossly intact. Moves all 4 extremities w/o difficulty. Affect pleasant.   ASSESSMENT & PLAN:

## 2013-02-16 NOTE — Assessment & Plan Note (Signed)
Patient seen and examined with Tonye Becket, NP. We discussed all aspects of the encounter. I agree with the assessment and plan as stated above.   Doing well. Volume status looks good on exam and by Optivol. Reinforced need for daily weights and reviewed use of sliding scale diuretics. Encourage to be more active and be compliant with CPAP. Continue current regimen.

## 2013-02-16 NOTE — Patient Instructions (Addendum)
We will contact you in 3 months to schedule your next appointment.  Next Optivol check is June 16th

## 2013-02-16 NOTE — Addendum Note (Signed)
Encounter addended by: Noralee Space, RN on: 02/16/2013 10:29 AM<BR>     Documentation filed: Patient Instructions Section

## 2013-03-03 DIAGNOSIS — Z79899 Other long term (current) drug therapy: Secondary | ICD-10-CM

## 2013-03-03 DIAGNOSIS — Z6841 Body Mass Index (BMI) 40.0 and over, adult: Secondary | ICD-10-CM

## 2013-03-03 DIAGNOSIS — G4733 Obstructive sleep apnea (adult) (pediatric): Secondary | ICD-10-CM | POA: Diagnosis present

## 2013-03-03 DIAGNOSIS — I472 Ventricular tachycardia, unspecified: Secondary | ICD-10-CM | POA: Diagnosis present

## 2013-03-03 DIAGNOSIS — E785 Hyperlipidemia, unspecified: Secondary | ICD-10-CM | POA: Diagnosis present

## 2013-03-03 DIAGNOSIS — I5022 Chronic systolic (congestive) heart failure: Secondary | ICD-10-CM | POA: Diagnosis present

## 2013-03-03 DIAGNOSIS — I4729 Other ventricular tachycardia: Secondary | ICD-10-CM | POA: Diagnosis present

## 2013-03-03 DIAGNOSIS — I2789 Other specified pulmonary heart diseases: Secondary | ICD-10-CM | POA: Diagnosis present

## 2013-03-03 DIAGNOSIS — J45909 Unspecified asthma, uncomplicated: Secondary | ICD-10-CM | POA: Diagnosis present

## 2013-03-03 DIAGNOSIS — I509 Heart failure, unspecified: Secondary | ICD-10-CM | POA: Diagnosis present

## 2013-03-03 DIAGNOSIS — D509 Iron deficiency anemia, unspecified: Secondary | ICD-10-CM | POA: Diagnosis present

## 2013-03-03 DIAGNOSIS — I10 Essential (primary) hypertension: Secondary | ICD-10-CM | POA: Diagnosis present

## 2013-03-03 DIAGNOSIS — E119 Type 2 diabetes mellitus without complications: Secondary | ICD-10-CM | POA: Diagnosis present

## 2013-03-03 DIAGNOSIS — K219 Gastro-esophageal reflux disease without esophagitis: Secondary | ICD-10-CM | POA: Diagnosis present

## 2013-03-03 DIAGNOSIS — R0902 Hypoxemia: Secondary | ICD-10-CM | POA: Diagnosis present

## 2013-03-03 DIAGNOSIS — E875 Hyperkalemia: Secondary | ICD-10-CM | POA: Diagnosis present

## 2013-03-03 DIAGNOSIS — J309 Allergic rhinitis, unspecified: Secondary | ICD-10-CM | POA: Diagnosis present

## 2013-03-03 DIAGNOSIS — I428 Other cardiomyopathies: Secondary | ICD-10-CM | POA: Diagnosis present

## 2013-03-03 DIAGNOSIS — T502X5A Adverse effect of carbonic-anhydrase inhibitors, benzothiadiazides and other diuretics, initial encounter: Secondary | ICD-10-CM | POA: Diagnosis not present

## 2013-03-03 DIAGNOSIS — J189 Pneumonia, unspecified organism: Principal | ICD-10-CM | POA: Diagnosis present

## 2013-03-03 DIAGNOSIS — Z95810 Presence of automatic (implantable) cardiac defibrillator: Secondary | ICD-10-CM

## 2013-03-03 MED ORDER — ALBUTEROL SULFATE (5 MG/ML) 0.5% IN NEBU
INHALATION_SOLUTION | RESPIRATORY_TRACT | Status: AC
  Administered 2013-03-03: 5 mg
  Filled 2013-03-03: qty 1

## 2013-03-03 MED ORDER — ALBUTEROL SULFATE (5 MG/ML) 0.5% IN NEBU: 5.0000 mg | INHALATION_SOLUTION | Freq: Once | RESPIRATORY_TRACT | Status: AC

## 2013-03-03 NOTE — ED Notes (Addendum)
Congested, sob, non-productive.  Bronchitis.  Low grade fevers. Took some cough medicine and mucinex. Pt. Taking inhalers frequently.

## 2013-03-04 ENCOUNTER — Emergency Department (HOSPITAL_COMMUNITY): Payer: Medicare Other

## 2013-03-04 ENCOUNTER — Inpatient Hospital Stay (HOSPITAL_COMMUNITY)
Admission: EM | Admit: 2013-03-04 | Discharge: 2013-03-08 | DRG: 194 | Disposition: A | Discharge status: Home / Self Care | Payer: Medicare Other | Attending: Internal Medicine | Admitting: Internal Medicine

## 2013-03-04 ENCOUNTER — Encounter (HOSPITAL_COMMUNITY): Payer: Self-pay | Admitting: Radiology

## 2013-03-04 DIAGNOSIS — IMO0002 Reserved for concepts with insufficient information to code with codable children: Secondary | ICD-10-CM | POA: Diagnosis present

## 2013-03-04 DIAGNOSIS — J189 Pneumonia, unspecified organism: Secondary | ICD-10-CM | POA: Diagnosis present

## 2013-03-04 DIAGNOSIS — I472 Ventricular tachycardia, unspecified: Secondary | ICD-10-CM | POA: Diagnosis present

## 2013-03-04 DIAGNOSIS — G4733 Obstructive sleep apnea (adult) (pediatric): Secondary | ICD-10-CM | POA: Diagnosis present

## 2013-03-04 DIAGNOSIS — I509 Heart failure, unspecified: Secondary | ICD-10-CM | POA: Diagnosis present

## 2013-03-04 DIAGNOSIS — J811 Chronic pulmonary edema: Secondary | ICD-10-CM

## 2013-03-04 DIAGNOSIS — R0902 Hypoxemia: Secondary | ICD-10-CM | POA: Diagnosis present

## 2013-03-04 DIAGNOSIS — E1165 Type 2 diabetes mellitus with hyperglycemia: Secondary | ICD-10-CM | POA: Diagnosis present

## 2013-03-04 DIAGNOSIS — R0989 Other specified symptoms and signs involving the circulatory and respiratory systems: Secondary | ICD-10-CM

## 2013-03-04 DIAGNOSIS — R0600 Dyspnea, unspecified: Secondary | ICD-10-CM

## 2013-03-04 DIAGNOSIS — R6510 Systemic inflammatory response syndrome (SIRS) of non-infectious origin without acute organ dysfunction: Secondary | ICD-10-CM

## 2013-03-04 DIAGNOSIS — I5023 Acute on chronic systolic (congestive) heart failure: Secondary | ICD-10-CM

## 2013-03-04 LAB — BASIC METABOLIC PANEL
BUN: 14 mg/dL (ref 6–23)
CO2: 28 mEq/L (ref 19–32)
Calcium: 9.1 mg/dL (ref 8.4–10.5)
Calcium: 9.2 mg/dL (ref 8.4–10.5)
Creatinine, Ser: 1.09 mg/dL (ref 0.50–1.35)
GFR calc Af Amer: 88 mL/min — ABNORMAL LOW (ref >90)
GFR calc non Af Amer: 67 mL/min — ABNORMAL LOW (ref >90)
GFR calc non Af Amer: 76 mL/min — ABNORMAL LOW (ref >90)
Glucose, Bld: 144 mg/dL — ABNORMAL HIGH (ref 70–99)
Potassium: 4.6 mEq/L (ref 3.5–5.1)
Sodium: 135 mEq/L (ref 135–145)

## 2013-03-04 LAB — CBC
HCT: 39.7 % (ref 39.0–52.0)
Hemoglobin: 12.9 g/dL — ABNORMAL LOW (ref 13.0–17.0)
MCH: 28.5 pg (ref 26.0–34.0)
MCHC: 32.5 g/dL (ref 30.0–36.0)
MCV: 87.6 fL (ref 78.0–100.0)
RDW: 13.9 % (ref 11.5–15.5)

## 2013-03-04 LAB — GLUCOSE, CAPILLARY: Glucose-Capillary: 145 mg/dL — ABNORMAL HIGH (ref 70–99)

## 2013-03-04 LAB — POCT I-STAT TROPONIN I

## 2013-03-04 LAB — HEMOGLOBIN A1C: Hgb A1c MFr Bld: 7.5 % — ABNORMAL HIGH (ref <5.7)

## 2013-03-04 LAB — MAGNESIUM: Magnesium: 2.1 mg/dL (ref 1.5–2.5)

## 2013-03-04 MED ORDER — CARVEDILOL 25 MG PO TABS
25.0000 mg | ORAL_TABLET | Freq: Two times a day (BID) | ORAL | Status: DC
  Administered 2013-03-04 – 2013-03-07 (×7): 25 mg via ORAL
  Filled 2013-03-04 (×13): qty 1

## 2013-03-04 MED ORDER — RAMIPRIL 10 MG PO CAPS
10.0000 mg | ORAL_CAPSULE | Freq: Every day | ORAL | Status: DC
  Administered 2013-03-04: 10 mg via ORAL
  Filled 2013-03-04: qty 1

## 2013-03-04 MED ORDER — SACCHAROMYCES BOULARDII 250 MG PO CAPS
250.0000 mg | ORAL_CAPSULE | Freq: Two times a day (BID) | ORAL | Status: DC
  Administered 2013-03-04 – 2013-03-08 (×9): 250 mg via ORAL
  Filled 2013-03-04 (×12): qty 1

## 2013-03-04 MED ORDER — DIGOXIN 125 MCG PO TABS
125.0000 ug | ORAL_TABLET | Freq: Every day | ORAL | Status: DC
  Administered 2013-03-04 – 2013-03-08 (×5): 125 ug via ORAL
  Filled 2013-03-04 (×6): qty 1

## 2013-03-04 MED ORDER — COLCHICINE 0.6 MG PO TABS
0.6000 mg | ORAL_TABLET | Freq: Every day | ORAL | Status: DC | PRN
  Filled 2013-03-04: qty 1

## 2013-03-04 MED ORDER — FLUTICASONE PROPIONATE 50 MCG/ACT NA SUSP
2.0000 | Freq: Every day | NASAL | Status: DC
  Administered 2013-03-04 – 2013-03-08 (×5): 2 via NASAL
  Filled 2013-03-04: qty 16

## 2013-03-04 MED ORDER — RAMIPRIL 10 MG PO TABS: 10.0000 mg | ORAL_TABLET | Freq: Every day | ORAL | Status: DC

## 2013-03-04 MED ORDER — RAMIPRIL 5 MG PO CAPS
5.0000 mg | ORAL_CAPSULE | Freq: Every day | ORAL | Status: DC
  Administered 2013-03-05 – 2013-03-07 (×3): 5 mg via ORAL
  Filled 2013-03-04 (×5): qty 1

## 2013-03-04 MED ORDER — FUROSEMIDE 10 MG/ML IJ SOLN
80.0000 mg | Freq: Once | INTRAMUSCULAR | Status: AC
  Administered 2013-03-04: 80 mg via INTRAVENOUS
  Filled 2013-03-04: qty 8

## 2013-03-04 MED ORDER — IOHEXOL 350 MG/ML SOLN
100.0000 mL | Freq: Once | INTRAVENOUS | Status: AC | PRN
  Administered 2013-03-04: 100 mL via INTRAVENOUS

## 2013-03-04 MED ORDER — ALBUTEROL SULFATE HFA 108 (90 BASE) MCG/ACT IN AERS
2.0000 | INHALATION_SPRAY | RESPIRATORY_TRACT | Status: DC | PRN
  Filled 2013-03-04: qty 6.7

## 2013-03-04 MED ORDER — INSULIN ASPART 100 UNIT/ML ~~LOC~~ SOLN
0.0000 [IU] | Freq: Every day | SUBCUTANEOUS | Status: DC
Start: 2013-03-04 — End: 2013-03-08

## 2013-03-04 MED ORDER — ENOXAPARIN SODIUM 40 MG/0.4ML ~~LOC~~ SOLN
40.0000 mg | SUBCUTANEOUS | Status: DC
  Administered 2013-03-04 – 2013-03-08 (×5): 40 mg via SUBCUTANEOUS
  Filled 2013-03-04 (×6): qty 0.4

## 2013-03-04 MED ORDER — MOMETASONE FURO-FORMOTEROL FUM 100-5 MCG/ACT IN AERO
2.0000 | INHALATION_SPRAY | Freq: Two times a day (BID) | RESPIRATORY_TRACT | Status: DC
  Administered 2013-03-04 – 2013-03-08 (×9): 2 via RESPIRATORY_TRACT
  Filled 2013-03-04: qty 8.8

## 2013-03-04 MED ORDER — MONTELUKAST SODIUM 10 MG PO TABS
10.0000 mg | ORAL_TABLET | Freq: Every day | ORAL | Status: DC
  Administered 2013-03-04 – 2013-03-08 (×5): 10 mg via ORAL
  Filled 2013-03-04 (×6): qty 1

## 2013-03-04 MED ORDER — ACETAMINOPHEN 650 MG RE SUPP: 650.0000 mg | Freq: Four times a day (QID) | RECTAL | Status: DC | PRN

## 2013-03-04 MED ORDER — SIMVASTATIN 40 MG PO TABS
40.0000 mg | ORAL_TABLET | Freq: Every day | ORAL | Status: DC
  Administered 2013-03-04 – 2013-03-07 (×4): 40 mg via ORAL
  Filled 2013-03-04 (×6): qty 1

## 2013-03-04 MED ORDER — SPIRONOLACTONE 12.5 MG HALF TABLET: 12.5000 mg | ORAL_TABLET | Freq: Every day | ORAL | Status: DC

## 2013-03-04 MED ORDER — SODIUM CHLORIDE 0.9 % IJ SOLN: 3.0000 mL | INTRAMUSCULAR | Status: DC | PRN

## 2013-03-04 MED ORDER — ASPIRIN 81 MG PO TABS: 81.0000 mg | ORAL_TABLET | Freq: Every day | ORAL | Status: DC

## 2013-03-04 MED ORDER — AZITHROMYCIN 500 MG PO TABS
500.0000 mg | ORAL_TABLET | Freq: Every day | ORAL | Status: DC
  Administered 2013-03-04 – 2013-03-08 (×5): 500 mg via ORAL
  Filled 2013-03-04: qty 2
  Filled 2013-03-04 (×5): qty 1

## 2013-03-04 MED ORDER — HYDROCODONE-ACETAMINOPHEN 5-325 MG PO TABS: 1.0000 | ORAL_TABLET | ORAL | Status: DC | PRN

## 2013-03-04 MED ORDER — ALLOPURINOL 100 MG PO TABS
100.0000 mg | ORAL_TABLET | Freq: Every day | ORAL | Status: DC
  Administered 2013-03-04 – 2013-03-08 (×5): 100 mg via ORAL
  Filled 2013-03-04 (×6): qty 1

## 2013-03-04 MED ORDER — SODIUM CHLORIDE 0.9 % IJ SOLN
3.0000 mL | Freq: Two times a day (BID) | INTRAMUSCULAR | Status: DC
  Administered 2013-03-04 – 2013-03-07 (×8): 3 mL via INTRAVENOUS

## 2013-03-04 MED ORDER — DEXTROSE 5 % IV SOLN
1.0000 g | INTRAVENOUS | Status: DC
  Administered 2013-03-04 – 2013-03-08 (×5): 1 g via INTRAVENOUS
  Filled 2013-03-04 (×6): qty 10

## 2013-03-04 MED ORDER — INSULIN ASPART 100 UNIT/ML ~~LOC~~ SOLN
3.0000 [IU] | Freq: Three times a day (TID) | SUBCUTANEOUS | Status: DC
  Administered 2013-03-04 – 2013-03-08 (×12): 3 [IU] via SUBCUTANEOUS

## 2013-03-04 MED ORDER — ONDANSETRON HCL 4 MG/2ML IJ SOLN: 4.0000 mg | Freq: Four times a day (QID) | INTRAMUSCULAR | Status: DC | PRN

## 2013-03-04 MED ORDER — ONDANSETRON HCL 4 MG PO TABS: 4.0000 mg | ORAL_TABLET | Freq: Four times a day (QID) | ORAL | Status: DC | PRN

## 2013-03-04 MED ORDER — ACETAMINOPHEN 325 MG PO TABS: 650.0000 mg | ORAL_TABLET | Freq: Four times a day (QID) | ORAL | Status: DC | PRN

## 2013-03-04 MED ORDER — ASPIRIN EC 81 MG PO TBEC
81.0000 mg | DELAYED_RELEASE_TABLET | Freq: Every day | ORAL | Status: DC
  Administered 2013-03-04 – 2013-03-08 (×5): 81 mg via ORAL
  Filled 2013-03-04 (×6): qty 1

## 2013-03-04 MED ORDER — TORSEMIDE 20 MG PO TABS
40.0000 mg | ORAL_TABLET | Freq: Two times a day (BID) | ORAL | Status: DC
  Administered 2013-03-04 – 2013-03-07 (×8): 40 mg via ORAL
  Filled 2013-03-04 (×12): qty 2

## 2013-03-04 NOTE — ED Notes (Signed)
Patient sitting up on the side of the bed.  States this helps me breath better.  Found lounge chair and assisted him into the chair.

## 2013-03-04 NOTE — Progress Notes (Signed)
1000 from ER / Proofreader

## 2013-03-04 NOTE — ED Notes (Signed)
Patient states for the last week he has been fighting with this congestion and cough.  States he wakes up during the night and has to change his shirt because he had been sweating so bad

## 2013-03-04 NOTE — ED Notes (Signed)
Report given to [id number]. [employer] called to have ICD interrogated.

## 2013-03-04 NOTE — Progress Notes (Signed)
UR complete.  Vanassa Penniman RN, MSN 

## 2013-03-04 NOTE — ED Notes (Signed)
[employer] ICD interrogated.  

## 2013-03-04 NOTE — Consult Note (Signed)
Advanced Heart Failure Team Consult Note  Referring Physician:  Primary Physician:Dr [name] Primary Cardiologist:  Dr Gala Romney  Reason for Consultation:   HPI:   The Heart Failure team was asked to provide consult by Dr [name] for assistance with heart failure.   Gary Hutchinson is a 51 year old male with a history of systolic heart failure, ejection fraction 25-30% per echo in July 2012, nonischemic cardiomyopathy, hypertension, type 2 diabetes mellitus, OSA on CPAP, history of ventricular tachycardia status post AICD placement in 2009, and morbid obesity.    In July 2013. he was admitted to [facility] for volume overload with subsuquent re-admit the next week for volume overload. Sluggish response to IV Lasix and placed on Milrinone. D/C weight 351.4 pounds.  Echo 05/03/12: EF 25-30%. Grade 1 diastolic dysfunction. Mild MR. Mod dilated LA.   He has been followed closely in the HF clinic with last OV 02/16/13. At that time volume status was stable. Weight was 358 pounds. Optivol interrogation- low no threshold crossings.    He presented to [facility] ED with increased dyspnea. Prior to admit he complained increased cough and body aches. Early this am temperature 103. Room Air Oxygen saturations 88%.  CXR- Vascular congestion and left basilar airspace opacity. CT of Chest- multifocal pneumonia on L. No PE. Pertinent admission labs included: Pro BNP 709, WBC 5.4, Potassium 5.5, Creatinine 1.09, and CE negative. Admit weight 348 pounds. Started on ceftriaxone and azithromycin.   Optivol Interrogation - 03/04/13 Crossings noted but headed back to baseline today.          Review of Systems: [y] = yes, [ ]  = no   General: Weight gain [ ] ; Weight loss [Y ]; Anorexia [ ] ; Fatigue [ Y]; Fever [ Y]; Chills [Y ]; Weakness [Y ]  Cardiac: Chest pain/pressure [ ] ; Resting SOB [ ] ; Exertional SOB [Y ]; Orthopnea [ ] ; Pedal Edema [ ] ; Palpitations [ ] ; Syncope [ ] ; Presyncope [ ] ; Paroxysmal nocturnal dyspnea[ ]   Pulmonary:  Cough [ ] ; Wheezing[ ] ; Hemoptysis[ ] ; Sputum [ ] ; Snoring [ ]   GI: Vomiting[ ] ; Dysphagia[ ] ; Melena[ ] ; Hematochezia [ ] ; Heartburn[ ] ; Abdominal pain [ ] ; Constipation [ ] ; Diarrhea [ ] ; BRBPR [ ]   GU: Hematuria[ ] ; Dysuria [ ] ; Nocturia[ ]   Vascular: Pain in legs with walking [ ] ; Pain in feet with lying flat [ ] ; Non-healing sores [ ] ; Stroke [ ] ; TIA [ ] ; Slurred speech [ ] ;  Neuro: Headaches[ ] ; Vertigo[ ] ; Seizures[ ] ; Paresthesias[ ] ;Blurred vision [ ] ; Diplopia [ ] ; Vision changes [ ]   Ortho/Skin: Arthritis [ ] ; Joint pain [ Y]; Muscle pain [ ] ; Joint swelling [ ] ; Back Pain [ ] ; Rash [ ]   Psych: Depression[Y ]; Anxiety[ ]   Heme: Bleeding problems [ ] ; Clotting disorders [ ] ; Anemia [ ]   Endocrine: Diabetes [Y ]; Thyroid dysfunction[ ]   Home Medications Prior to Admission medications   Medication Sig Start Date End Date Taking? Authorizing Provider  albuterol (PROAIR HFA) 108 (90 BASE) MCG/ACT inhaler Inhale 2 puffs into the lungs every 6 (six) hours as needed. For wheezing or shortness of breath   Yes Historical Provider, MD  allopurinol (ZYLOPRIM) 100 MG tablet Take 1 tablet by mouth daily. 07/12/12  Yes Historical Provider, MD  aspirin 81 MG tablet Take 81 mg by mouth daily.   Yes Historical Provider, MD  carvedilol (COREG) 25 MG tablet Take 1 tablet (25 mg total) by mouth 2 (two) times daily with a meal. 12/08/12  Yes [name], MD  colchicine 0.6 MG tablet Take 0.6 mg by mouth daily as needed. Gout flare ups   Yes Historical Provider, MD  digoxin (LANOXIN) 0.25 MG tablet Take 0.5 tablets (125 mcg total) by mouth daily. 08/09/12  Yes Bevelyn Buckles [name], MD  fluticasone (FLONASE) 50 MCG/ACT nasal spray Place 2 sprays into the nose as needed.  08/04/12  Yes Historical Provider, MD  Fluticasone-Salmeterol (ADVAIR DISKUS) 250-50 MCG/DOSE AEPB Inhale 1 puff into the lungs every 12 (twelve) hours as needed. For wheezing or shortness of breath   Yes Historical Provider, MD   glipizide-metformin (METAGLIP) 2.5-250 MG per tablet Take 1 tablet by mouth 2 (two) times daily before a meal.     Yes Historical Provider, MD  HYDROcodone-acetaminophen (NORCO/VICODIN) 5-325 MG per tablet Take 2 tablets by mouth every 4 (four) hours as needed for pain. 12/29/12  Yes Adlih Moreno-Coll, MD  metroNIDAZOLE (FLAGYL) 500 MG tablet Take 1 tablet (500 mg total) by mouth 2 (two) times daily. 12/29/12  Yes Adlih Moreno-Coll, MD  montelukast (SINGULAIR) 10 MG tablet Take 10 mg by mouth daily as needed. For shortness of breath or wheezing   Yes Historical Provider, MD  ramipril (ALTACE) 10 MG tablet Take 10 mg by mouth daily.     Yes Historical Provider, MD  simvastatin (ZOCOR) 40 MG tablet Take 40 mg by mouth at bedtime.     Yes Historical Provider, MD  spironolactone (ALDACTONE) 25 MG tablet Take 12.5 mg by mouth daily.    Yes Historical Provider, MD  torsemide (DEMADEX) 20 MG tablet Take 40 mg by mouth 2 (two) times daily.    Yes Historical Provider, MD  traMADol (ULTRAM) 50 MG tablet Take 1 tablet (50 mg total) by mouth every 8 (eight) hours as needed. 12/29/12  Yes Sharin Grave, MD    Past Medical History: Past Medical History  Diagnosis Date  . Cardiomyopathy     nonischemic  . Ventricular tachycardia     s/p MDT ICD implant  . Alcohol abuse     now quit  . Pulmonary hypertension   . Diverticulosis of colon   . Hemorrhoids, internal   . Cholecystitis   . Dyspepsia   . Morbid obesity   . Obstructive sleep apnea   . Anemia     iron defi  . Hypertension   . Gout   . Diabetes mellitus   . Chronic systolic congestive heart failure   . Asthma     Past Surgical History: Past Surgical History  Procedure Laterality Date  . Icd implantation      ICD- Medtronic 9/09  . Cardiac catheterization  03/08/2004    EF 25-30%  . US echocardiography  08/22/2008    EF 30-35%  . Transthoracic echocardiogram  05/27/2008    EF 30-35%    Family History: Family History  Problem  Relation Age of Onset  . Cancer Father   . Diabetes      grandparents  . Hypertension Mother     Social History: History   Social History  . Marital Status: Single    Spouse Name: N/A    Number of Children: 4  . Years of Education: N/A   Occupational History  .     Social History Main Topics  . Smoking status: Never Smoker   . Smokeless tobacco: None  . Alcohol Use: No     Comment: former heavy ETOH, quit  . Drug Use: No  . Sexually Active: None  Other Topics Concern  . None   Social History Narrative   disabled    Allergies:  No Known Allergies  Objective:    Vital Signs:   Temp:  [98.7 F (37.1 C)-100.3 F (37.9 C)] 98.7 F (37.1 C) (05/30 1025) Pulse Rate:  [64-105] 96 (05/30 1025) Resp:  [22-43] 30 (05/30 0749) BP: (81-125)/(41-79) 125/79 mmHg (05/30 1025) SpO2:  [86 %-97 %] 95 % (05/30 1025) FiO2 (%):  [4 %] 4 % (05/30 0137) Weight:  [348 lb 8.8 oz (158.1 kg)] 348 lb 8.8 oz (158.1 kg) (05/30 1025)    Weight change: Filed Weights   03/04/13 1025  Weight: 348 lb 8.8 oz (158.1 kg)    Intake/Output:  No intake or output data in the 24 hours ending 03/04/13 1101   Physical Exam: General:  Fatigued appearing. No resp difficulty Sitting in chair HEENT: normal Neck: supple. JVP hard to see due to body habitus . Carotids 2+ bilat; no bruits. No lymphadenopathy or thryomegaly appreciated. Cor: PMI nondisplaced. Regular rate & rhythm. No rubs, gallops or  TR murmurs. Lungs: LUL EW  LLL crackles. RLL diminished Abdomen: obese, soft, nontender, nondistended. No hepatosplenomegaly. No bruits or masses. Good bowel sounds. Extremities: no cyanosis, clubbing, rash, edema Neuro: alert & orientedx3, cranial nerves grossly intact. moves all 4 extremities w/o difficulty. Affect pleasant  Telemetry: SR Labs: Basic Metabolic Panel:  Recent Labs Lab 03/03/13 2337  NA 134*  K 5.5*  CL 97  CO2 27  GLUCOSE 144*  BUN 14  CREATININE 1.09  CALCIUM 9.1     Liver Function Tests: No results found for this basename: AST, ALT, ALKPHOS, BILITOT, PROT, ALBUMIN,  in the last 168 hours No results found for this basename: LIPASE, AMYLASE,  in the last 168 hours No results found for this basename: AMMONIA,  in the last 168 hours  CBC:  Recent Labs Lab 03/03/13 2337  WBC 5.4  HGB 12.9*  HCT 39.7  MCV 87.6  PLT 141*    Cardiac Enzymes: No results found for this basename: CKTOTAL, CKMB, CKMBINDEX, TROPONINI,  in the last 168 hours  BNP: BNP (last 3 results)  Recent Labs  03/03/13 2337  PROBNP 709.1*    CBG: No results found for this basename: GLUCAP,  in the last 168 hours  Coagulation Studies: No results found for this basename: LABPROT, INR,  in the last 72 hours  Other results: EKG:   Imaging: Dg Chest 2 View  03/04/2013   *RADIOLOGY REPORT*  Clinical Data: Congestion and shortness of breath; low grade fever.  CHEST - 2 VIEW  Comparison: Chest radiograph performed 07/13/2011  Findings: The lungs are relatively well expanded.  Vascular congestion is noted, with diffusely increased interstitial markings and left basilar airspace opacity.  This may reflect pneumonia or possibly pulmonary edema.  No pleural effusion or pneumothorax is seen.  The cardiomediastinal silhouette is enlarged.  An AICD is noted at the left chest wall, with a single lead ending at the right ventricle.  No acute osseous abnormalities are seen.  IMPRESSION: Vascular congestion and cardiomegaly, with diffusely increased interstitial markings and left basilar airspace opacity.  This may reflect pneumonia or possibly pulmonary edema.   Original Report Authenticated By: Tonia Ghent, M.D.   Ct Angio Chest Pe W/cm &/or Wo Cm  03/04/2013   *RADIOLOGY REPORT*  Clinical Data: Shortness of breath and low grade fever; tachypnea and tachycardia.  CT ANGIOGRAPHY CHEST  Technique:  Multidetector CT imaging of the chest using  the standard protocol during bolus  administration of intravenous contrast. Multiplanar reconstructed images including MIPs were obtained and reviewed to evaluate the vascular anatomy.  Contrast: OMNIPAQUE IOHEXOL 350 MG/ML SOLN  Comparison: CTA of the chest performed 04/06/2011, and chest radiograph performed earlier today at 02:26 a.m.  Findings: There is no evidence of significant pulmonary embolus. Evaluation for pulmonary embolus is mildly suboptimal due to motion artifact.  Patchy left upper lobe and left lower lobe airspace opacities are concerning for pneumonia.  Additional minimal central airspace opacity is noted in the right lung.  There is no evidence of pleural effusion or pneumothorax.  No masses are identified; no abnormal focal contrast enhancement is seen.  The mediastinum is unremarkable in appearance.  No mediastinal lymphadenopathy is seen.  No pericardial effusion is identified. The great vessels are grossly unremarkable in appearance. Visualized perihilar nodes remain borderline normal in size. Biatrial enlargement is noted.  A left-sided AICD is noted, with a lead noted ending at the right ventricle.  No axillary lymphadenopathy is seen.  The visualized portions of the thyroid gland are unremarkable in appearance.  The visualized portions of the liver and spleen are unremarkable.  No acute osseous abnormalities are seen.  IMPRESSION:  1.  No evidence of significant pulmonary embolus. 2.  Multifocal pneumonia, predominantly on the left side. 3.  Biatrial enlargement noted.   Original Report Authenticated By: Tonia Ghent, M.D.     Medications:     Current Medications: . allopurinol  100 mg Oral Daily  . aspirin EC  81 mg Oral Daily  . azithromycin  500 mg Oral Daily  . carvedilol  25 mg Oral BID WC  . cefTRIAXone (ROCEPHIN)  IV  1 g Intravenous Q24H  . digoxin  125 mcg Oral Daily  . enoxaparin (LOVENOX) injection  40 mg Subcutaneous Q24H  . fluticasone  2 spray Each Nare Daily  . insulin aspart  0-5 Units  Subcutaneous QHS  . insulin aspart  3 Units Subcutaneous TID WC  . mometasone-formoterol  2 puff Inhalation BID  . montelukast  10 mg Oral Daily  . ramipril  10 mg Oral Daily  . saccharomyces boulardii  250 mg Oral BID  . simvastatin  40 mg Oral QHS  . sodium chloride  3 mL Intravenous Q12H  . torsemide  40 mg Oral BID    Infusions:     Assessment:   1. A/C respiratory failure - tachypnea  O2 sats 88% on room air 2. Pneumonia- CT scan 03/04/13 3. NICM - 05/03/12 ECHO EF 25-30%  4 HTN 5. OSA- CPAP 6. H/O VT- AICD 2009 7 Morbid Obesity 8. DM 9. Hyperkalemia   Plan/Discussion:   Mr Hanauer is well known to the HF team. Admitted today with dyspnea. CT of chest revealed multifocal pneumonia. Antibiotics per Dr [name].   From a heart failure perspective volume status is stable. Optivol interrogated with no crossing.  Continue current diuretic regimen. Follow renal function closely. Potassium 5.5. Will cut ramipril in half to 5 mg daily.  Repeat potassium now   Heart Failure Team will follow from a distance. Call with questions.   Length of Stay: 0  Gary Hutchinson,AMY NP-C 03/04/2013, 11:01 AM  Patient seen with NP, agree with the above note.  I think that his main issue here is multilobar PNA.  His volume status is difficult given body habitus but does not appear volume overloaded.  Optivol interrogation confirms this.  Would continue his current CHF meds except will  decrease ramipril dose if repeat K level is still high.   Marca Ancona 03/04/2013 1:22 PM   Advanced Heart Failure Team Pager [phone] (M-F; 7a - 4p)  Please contact [facility] for night-coverage after hours (4p -7a ) and weekends on [email or web address]

## 2013-03-04 NOTE — ED Provider Notes (Signed)
History     CSN: [id number]  Arrival date & time 03/03/13  2257   First MD Initiated Contact with Patient 03/04/13 0119      Chief Complaint  Patient presents with  . Shortness of Breath    (Consider location/radiation/quality/duration/timing/severity/associated sxs/prior treatment) HPI  Gary Hutchinson is a very pleasant 52 year old man with nonischemic cardiomyopathy, morbid obesity and history of alcohol abuse along with several comorbidities.  He presents today with complaints of shortness of breath and a feeling of fluid buildup on his lungs. His symptoms have been present for worsening for the past 2-3 days. He denies chest pain. He has a chronic 3 pillow orthopnea which is unchanged. However, he sometimes wakes up at night feeling short of breath.  The patient reports compliance with all of his medications. He denies any recent medication changes. He has not appreciated any dependent edema. No fever. No URI symptoms. He is a nonsmoker. Denies any recent cocaine or illicit drug use. Denies compliance with low sodium diet.   The patient wears CPAP at night but, he denies home 02 use during the day.   Past Medical History  Diagnosis Date  . Cardiomyopathy     nonischemic  . Ventricular tachycardia     s/p MDT ICD implant  . Alcohol abuse     now quit  . Pulmonary hypertension   . Diverticulosis of colon   . Hemorrhoids, internal   . Cholecystitis   . Dyspepsia   . Morbid obesity   . Obstructive sleep apnea   . Anemia     iron defi  . Hypertension   . Gout   . Diabetes mellitus   . Chronic systolic congestive heart failure   . Asthma     Past Surgical History  Procedure Laterality Date  . Icd implantation      ICD- Medtronic 9/09  . Cardiac catheterization  03/08/2004    EF 25-30%  . US echocardiography  08/22/2008    EF 30-35%  . Transthoracic echocardiogram  05/27/2008    EF 30-35%    Family History  Problem Relation Age of Onset  . Cancer Father   .  Diabetes      grandparents  . Hypertension Mother     History  Substance Use Topics  . Smoking status: Never Smoker   . Smokeless tobacco: Not on file  . Alcohol Use: No     Comment: former heavy ETOH, quit      Review of Systems Gen: no weight loss, fevers, chills, night sweats Eyes: no discharge or drainage, no occular pain or visual changes Nose: no epistaxis or rhinorrhea Mouth: no dental pain, no sore throat Neck: no neck pain Lungs: as per hpi, otherwise negative CV: no chest pain, palpitations, dependent edema or orthopnea Abd: no abdominal pain, nausea, vomiting GU: no dysuria or gross hematuria MSK: no myalgias or arthralgias Neuro: no headache, no focal neurologic deficits Skin: no rash Psyche: negative.  Allergies  Review of patient's allergies indicates no known allergies.  Home Medications   Current Outpatient Rx  Name  Route  Sig  Dispense  Refill  . albuterol (PROAIR HFA) 108 (90 BASE) MCG/ACT inhaler   Inhalation   Inhale 2 puffs into the lungs every 6 (six) hours as needed. For wheezing or shortness of breath         . allopurinol (ZYLOPRIM) 100 MG tablet   Oral   Take 1 tablet by mouth daily.         Marland Kitchen  aspirin 81 MG tablet   Oral   Take 81 mg by mouth daily.         . carvedilol (COREG) 25 MG tablet   Oral   Take 1 tablet (25 mg total) by mouth 2 (two) times daily with a meal.   60 tablet   3   . colchicine 0.6 MG tablet   Oral   Take 0.6 mg by mouth daily as needed. Gout flare ups         . digoxin (LANOXIN) 0.25 MG tablet   Oral   Take 0.5 tablets (125 mcg total) by mouth daily.   30 tablet   6   . fluticasone (FLONASE) 50 MCG/ACT nasal spray   Nasal   Place 2 sprays into the nose as needed.          . Fluticasone-Salmeterol (ADVAIR DISKUS) 250-50 MCG/DOSE AEPB   Inhalation   Inhale 1 puff into the lungs every 12 (twelve) hours as needed. For wheezing or shortness of breath         . glipizide-metformin  (METAGLIP) 2.5-250 MG per tablet   Oral   Take 1 tablet by mouth 2 (two) times daily before a meal.           . HYDROcodone-acetaminophen (NORCO/VICODIN) 5-325 MG per tablet   Oral   Take 2 tablets by mouth every 4 (four) hours as needed for pain.   6 tablet   0   . metroNIDAZOLE (FLAGYL) 500 MG tablet   Oral   Take 1 tablet (500 mg total) by mouth 2 (two) times daily.   14 tablet   0   . montelukast (SINGULAIR) 10 MG tablet   Oral   Take 10 mg by mouth daily as needed. For shortness of breath or wheezing         . ramipril (ALTACE) 10 MG tablet   Oral   Take 10 mg by mouth daily.           . simvastatin (ZOCOR) 40 MG tablet   Oral   Take 40 mg by mouth at bedtime.           Marland Kitchen spironolactone (ALDACTONE) 25 MG tablet   Oral   Take 12.5 mg by mouth daily.          Marland Kitchen torsemide (DEMADEX) 20 MG tablet   Oral   Take 40 mg by mouth 2 (two) times daily.          . traMADol (ULTRAM) 50 MG tablet   Oral   Take 1 tablet (50 mg total) by mouth every 8 (eight) hours as needed.   20 tablet   0     BP 81/41  Pulse 99  Temp(Src) 99.7 F (37.6 C) (Oral)  Resp 22  SpO2 96%  Physical Exam Gen: well developed and morbidly obese, blood pressure is normal at 118/60 on my assessment, room air sats are 87-88%. Head: NCAT Eyes: PERL, EOMI Nose: no epistaixis or rhinorrhea Mouth/throat: mucosa is moist and pink Neck: supple, no stridor Lungs: diminished BS bilaterally - likely due to body habitus, no wheezing, rhonchi or rales CV: RRR, no murmur Abd: soft, notender, super morbidly obese Back: no ttp, no cva ttp Skin: no rashese, wnl Neuro: CN ii-xii grossly intact, no focal deficits Ext: no edema is appreciated Psyche; normal affect,  calm and cooperative.   ED Course  Procedures (including critical care time)  Results for orders placed during the hospital encounter of 03/04/13 (  from the past 24 hour(s))  CBC     Status: Abnormal   Collection Time     03/03/13 11:37 PM      Result Value Range   WBC 5.4  4.0 - 10.5 K/uL   RBC 4.53  4.22 - 5.81 MIL/uL   Hemoglobin 12.9 (*) 13.0 - 17.0 g/dL   HCT 16.1  09.6 - 04.5 %   MCV 87.6  78.0 - 100.0 fL   MCH 28.5  26.0 - 34.0 pg   MCHC 32.5  30.0 - 36.0 g/dL   RDW 40.9  81.1 - 91.4 %   Platelets 141 (*) 150 - 400 K/uL  BASIC METABOLIC PANEL     Status: Abnormal   Collection Time    03/03/13 11:37 PM      Result Value Range   Sodium 134 (*) 135 - 145 mEq/L   Potassium 5.5 (*) 3.5 - 5.1 mEq/L   Chloride 97  96 - 112 mEq/L   CO2 27  19 - 32 mEq/L   Glucose, Bld 144 (*) 70 - 99 mg/dL   BUN 14  6 - 23 mg/dL   Creatinine, Ser 7.82  0.50 - 1.35 mg/dL   Calcium 9.1  8.4 - 95.6 mg/dL   GFR calc non Af Amer 76 (*) >90 mL/min   GFR calc Af Amer 88 (*) >90 mL/min  PRO B NATRIURETIC PEPTIDE     Status: Abnormal   Collection Time    03/03/13 11:37 PM      Result Value Range   Pro B Natriuretic peptide (BNP) 709.1 (*) 0 - 125 pg/mL  POCT I-STAT TROPONIN I     Status: None   Collection Time    03/03/13 11:57 PM      Result Value Range   Troponin i, poc 0.04  0.00 - 0.08 ng/mL   Comment 3              Dg Chest 2 View  03/04/2013   *RADIOLOGY REPORT*  Clinical Data: Congestion and shortness of breath; low grade fever.  CHEST - 2 VIEW  Comparison: Chest radiograph performed 07/13/2011  Findings: The lungs are relatively well expanded.  Vascular congestion is noted, with diffusely increased interstitial markings and left basilar airspace opacity.  This may reflect pneumonia or possibly pulmonary edema.  No pleural effusion or pneumothorax is seen.  The cardiomediastinal silhouette is enlarged.  An AICD is noted at the left chest wall, with a single lead ending at the right ventricle.  No acute osseous abnormalities are seen.  IMPRESSION: Vascular congestion and cardiomegaly, with diffusely increased interstitial markings and left basilar airspace opacity.  This may reflect pneumonia or possibly pulmonary  edema.   Original Report Authenticated By: Tonia Ghent, M.D.    EKG: nsr, no acute ischemic changes, normal intervals, normal axis, normal qrs complex   MDM  DDX: pulmonary edema, pneumonia, pleural or pericardial effusion, bronchitis, PE.  CXR is consistent with pulmonary edema. BNP, however, is mildly elevated at 709. Patient is noted to have a temp of 100.32F. He is certainly hypoxemic. We will tx with a diuretic.  Will discuss weight limit for CT table with Radiology Dept to determine if the patient is a candidate for a PE study.   Weight limit is 400 lb. Patient states he weighs 370 lb. Will confirm and send for PE study. Medicine will be paged to request admission.   CRITICAL CARE Performed by: Brandt Loosen   Total critical care  time: 40m  Critical care time was exclusive of separately billable procedures and treating other patients.  Critical care was necessary to treat or prevent imminent or life-threatening deterioration.  Critical care was time spent personally by me on the following activities: development of treatment plan with patient and/or surrogate as well as nursing, discussions with consultants, evaluation of patient's response to treatment, examination of patient, obtaining history from patient or surrogate, ordering and performing treatments and interventions, ordering and review of laboratory studies, ordering and review of radiographic studies, pulse oximetry and re-evaluation of patient's condition.       Brandt Loosen, MD 03/05/13 [phone]
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
## 2013-03-04 NOTE — Progress Notes (Signed)
Subjective: Admitted a few hrs ago by Dr Waynard Edwards for CAP/Hypoxia and placed on Rocephin/Azithro, O2 and Pulm toilet.  Objective: Vital signs in last 24 hours: Temp:  [99.7 F (37.6 C)-100.3 F (37.9 C)] 99.7 F (37.6 C) (05/30 0133) Pulse Rate:  [64-105] 102 (05/30 0700) Resp:  [22-43] 26 (05/30 0700) BP: (81-119)/(41-77) 115/69 mmHg (05/30 0430) SpO2:  [86 %-97 %] 96 % (05/30 0700) FiO2 (%):  [4 %] 4 % (05/30 0137) Weight change:     CBG (last 3)  No results found for this basename: GLUCAP,  in the last 72 hours  Intake/Output from previous day: No intake or output data in the 24 hours ending 03/04/13 0740     Physical Exam  General appearance: Alert and O.  Sitting in Chair.  Morbid Obesity.  Wearing FIO2 Eyes: no scleral icterus Throat: oropharynx moist without erythema Resp: Distant Cardio: Reg. AICD in place. GI: soft, non-tender; bowel sounds normal; no masses,  no organomegaly. Obese. Extremities: no clubbing/cyanosis; min edema   Lab Results:  Recent Labs  03/03/13 2337  NA 134*  K 5.5*  CL 97  CO2 27  GLUCOSE 144*  BUN 14  CREATININE 1.09  CALCIUM 9.1    No results found for this basename: AST, ALT, ALKPHOS, BILITOT, PROT, ALBUMIN,  in the last 72 hours   Recent Labs  03/03/13 2337  WBC 5.4  HGB 12.9*  HCT 39.7  MCV 87.6  PLT 141*    No results found for this basename: INR,  PROTIME    No results found for this basename: CKTOTAL, CKMB, CKMBINDEX, TROPONINI,  in the last 72 hours  No results found for this basename: TSH, T4TOTAL, FREET3, T3FREE, THYROIDAB,  in the last 72 hours  No results found for this basename: VITAMINB12, FOLATE, FERRITIN, TIBC, IRON, RETICCTPCT,  in the last 72 hours  Micro Results: No results found for this or any previous visit (from the past 240 hour(s)).   Studies/Results: Dg Chest 2 View  03/04/2013   *RADIOLOGY REPORT*  Clinical Data: Congestion and shortness of breath; low grade fever.  CHEST - 2 VIEW   Comparison: Chest radiograph performed 07/13/2011  Findings: The lungs are relatively well expanded.  Vascular congestion is noted, with diffusely increased interstitial markings and left basilar airspace opacity.  This may reflect pneumonia or possibly pulmonary edema.  No pleural effusion or pneumothorax is seen.  The cardiomediastinal silhouette is enlarged.  An AICD is noted at the left chest wall, with a single lead ending at the right ventricle.  No acute osseous abnormalities are seen.  IMPRESSION: Vascular congestion and cardiomegaly, with diffusely increased interstitial markings and left basilar airspace opacity.  This may reflect pneumonia or possibly pulmonary edema.   Original Report Authenticated By: Tonia Ghent, M.D.   Ct Angio Chest Pe W/cm &/or Wo Cm  03/04/2013   *RADIOLOGY REPORT*  Clinical Data: Shortness of breath and low grade fever; tachypnea and tachycardia.  CT ANGIOGRAPHY CHEST  Technique:  Multidetector CT imaging of the chest using the standard protocol during bolus administration of intravenous contrast. Multiplanar reconstructed images including MIPs were obtained and reviewed to evaluate the vascular anatomy.  Contrast: OMNIPAQUE IOHEXOL 350 MG/ML SOLN  Comparison: CTA of the chest performed 04/06/2011, and chest radiograph performed earlier today at 02:26 a.m.  Findings: There is no evidence of significant pulmonary embolus. Evaluation for pulmonary embolus is mildly suboptimal due to motion artifact.  Patchy left upper lobe and left lower lobe airspace  opacities are concerning for pneumonia.  Additional minimal central airspace opacity is noted in the right lung.  There is no evidence of pleural effusion or pneumothorax.  No masses are identified; no abnormal focal contrast enhancement is seen.  The mediastinum is unremarkable in appearance.  No mediastinal lymphadenopathy is seen.  No pericardial effusion is identified. The great vessels are grossly unremarkable in  appearance. Visualized perihilar nodes remain borderline normal in size. Biatrial enlargement is noted.  A left-sided AICD is noted, with a lead noted ending at the right ventricle.  No axillary lymphadenopathy is seen.  The visualized portions of the thyroid gland are unremarkable in appearance.  The visualized portions of the liver and spleen are unremarkable.  No acute osseous abnormalities are seen.  IMPRESSION:  1.  No evidence of significant pulmonary embolus. 2.  Multifocal pneumonia, predominantly on the left side. 3.  Biatrial enlargement noted.   Original Report Authenticated By: Tonia Ghent, M.D.     Medications: Scheduled: . azithromycin  500 mg Oral Daily   Continuous: . cefTRIAXone (ROCEPHIN)  IV 1 g (03/04/13 0647)     Assessment/Plan: Principal Problem:   Community acquired pneumonia Active Problems:   DIABETES MELLITUS, TYPE II   Morbid obesity   OBSTRUCTIVE SLEEP APNEA   CONGESTIVE HEART FAILURE   Ventricular tachycardia   Hypoxia  CAP- IV Abx, O2, pulm toilet. Hypoxia/tachypnea and findings consistent with pneumonia with bronchospasm - Sxatic rx. systolic heart failure/ejection fraction 25-30% per echo in July 2012 due to nonischemic cardiomyopathy - Dr Jones Broom as outpatient and last saw him 5/14. I already informed him of admission.  I asked cards to see him @ once this weekend.  Dr Waynard Edwards increased diuretics.  Watch with borderline BPs. HTN - On mult meds.  Watch with soft BPs. type 2 diabetes mellitus OSA on Currently on bilevel 17/13. Will do HS.  history of ventricular tachycardia status post AICD placement in 2009 Morbid obesity - Needs sig weight off.  DVT Prophylaxis    LOS: 0 days   Tapanga Ottaway M 03/04/2013, 7:40 AM

## 2013-03-04 NOTE — Progress Notes (Signed)
Pt had 5 beats of v-tach . Pt asymptomatic vss. 116/64 hr 98. No c/o chest pain. vtach occurred while pt was moving. Continued to monitor pt closely

## 2013-03-05 ENCOUNTER — Encounter (HOSPITAL_COMMUNITY): Payer: Self-pay | Admitting: *Deleted

## 2013-03-05 LAB — COMPREHENSIVE METABOLIC PANEL
ALT: 14 U/L (ref 0–53)
AST: 25 U/L (ref 0–37)
Albumin: 3.8 g/dL (ref 3.5–5.2)
Calcium: 9.4 mg/dL (ref 8.4–10.5)
Chloride: 94 mEq/L — ABNORMAL LOW (ref 96–112)
Creatinine, Ser: 1.26 mg/dL (ref 0.50–1.35)
Sodium: 137 mEq/L (ref 135–145)

## 2013-03-05 LAB — GLUCOSE, CAPILLARY: Glucose-Capillary: 161 mg/dL — ABNORMAL HIGH (ref 70–99)

## 2013-03-05 LAB — CBC WITH DIFFERENTIAL/PLATELET
Basophils Relative: 1 % (ref 0–1)
Eosinophils Relative: 6 % — ABNORMAL HIGH (ref 0–5)
Hemoglobin: 13.1 g/dL (ref 13.0–17.0)
Lymphs Abs: 1.9 10*3/uL (ref 0.7–4.0)
MCH: 28.5 pg (ref 26.0–34.0)
MCV: 88.9 fL (ref 78.0–100.0)
Monocytes Absolute: 0.7 10*3/uL (ref 0.1–1.0)
Neutro Abs: 1.7 10*3/uL (ref 1.7–7.7)
RBC: 4.6 MIL/uL (ref 4.22–5.81)

## 2013-03-05 NOTE — Progress Notes (Signed)
Subjective: Admitted  for CAP/Hypoxia and placed on Rocephin/Azithro, O2 and Pulm toilet. Appreciate cards input. Due to hyperkalemia - ACEi dosage dropped and Aldactone held Wore BiPAP last night. Sitting in chair this am.  Coughing sputum and SOB but looks @ baseline.  Objective: Vital signs in last 24 hours: Temp:  [98.3 F (36.8 C)-98.7 F (37.1 C)] 98.3 F (36.8 C) (05/31 0605) Pulse Rate:  [88-99] 92 (05/31 0605) Resp:  [18-24] 18 (05/31 0605) BP: (111-130)/(64-81) 118/81 mmHg (05/31 0605) SpO2:  [84 %-93 %] 90 % (05/31 0851) Weight:  [157.67 kg (347 lb 9.6 oz)] 157.67 kg (347 lb 9.6 oz) (05/31 0605) Weight change:  Last BM Date: 03/05/13  CBG (last 3)   Recent Labs  03/04/13 1618 03/04/13 2200 03/05/13 0544  GLUCAP 145* 128* 149*    Intake/Output from previous day:  Intake/Output Summary (Last 24 hours) at 03/05/13 1042 Last data filed at 03/05/13 0820  Gross per 24 hour  Intake   1240 ml  Output      0 ml  Net   1240 ml   05/30 0701 - 05/31 0700 In: 1000 [P.O.:900; IV Piggyback:100] Out: -    Physical Exam  General appearance: Alert and O.  Sitting in Chair.  Morbid Obesity.   Eyes: no scleral icterus Throat: oropharynx moist without erythema Resp: Distant Cardio: Reg. AICD in place. GI: soft, non-tender; bowel sounds normal; no masses,  no organomegaly. Obese. Extremities: no clubbing/cyanosis; min edema   Lab Results:  Recent Labs  03/03/13 2337 03/04/13 1049 03/05/13 0502  NA 134* 135 137  K 5.5* 4.6 4.2  CL 97 96 94*  CO2 27 28 30   GLUCOSE 144* 158* 142*  BUN 14 15 20   CREATININE 1.09 1.22 1.26  CALCIUM 9.1 9.2 9.4  MG  --  2.1  --   PHOS  --  4.1  --      Recent Labs  03/05/13 0502  AST 25  ALT 14  ALKPHOS 62  BILITOT 0.5  PROT 8.0  ALBUMIN 3.8     Recent Labs  03/03/13 2337 03/05/13 0502  WBC 5.4 4.6  NEUTROABS  --  1.7  HGB 12.9* 13.1  HCT 39.7 40.9  MCV 87.6 88.9  PLT 141* 130*    No results found for  this basename: INR,  PROTIME    No results found for this basename: CKTOTAL, CKMB, CKMBINDEX, TROPONINI,  in the last 72 hours   Recent Labs  03/04/13 1049  TSH 1.587    No results found for this basename: VITAMINB12, FOLATE, FERRITIN, TIBC, IRON, RETICCTPCT,  in the last 72 hours  Micro Results: No results found for this or any previous visit (from the past 240 hour(s)).   Studies/Results: Dg Chest 2 View  03/04/2013   *RADIOLOGY REPORT*  Clinical Data: Congestion and shortness of breath; low grade fever.  CHEST - 2 VIEW  Comparison: Chest radiograph performed 07/13/2011  Findings: The lungs are relatively well expanded.  Vascular congestion is noted, with diffusely increased interstitial markings and left basilar airspace opacity.  This may reflect pneumonia or possibly pulmonary edema.  No pleural effusion or pneumothorax is seen.  The cardiomediastinal silhouette is enlarged.  An AICD is noted at the left chest wall, with a single lead ending at the right ventricle.  No acute osseous abnormalities are seen.  IMPRESSION: Vascular congestion and cardiomegaly, with diffusely increased interstitial markings and left basilar airspace opacity.  This may reflect pneumonia or possibly pulmonary  edema.   Original Report Authenticated By: Tonia Ghent, M.D.   Ct Angio Chest Pe W/cm &/or Wo Cm  03/04/2013   *RADIOLOGY REPORT*  Clinical Data: Shortness of breath and low grade fever; tachypnea and tachycardia.  CT ANGIOGRAPHY CHEST  Technique:  Multidetector CT imaging of the chest using the standard protocol during bolus administration of intravenous contrast. Multiplanar reconstructed images including MIPs were obtained and reviewed to evaluate the vascular anatomy.  Contrast: OMNIPAQUE IOHEXOL 350 MG/ML SOLN  Comparison: CTA of the chest performed 04/06/2011, and chest radiograph performed earlier today at 02:26 a.m.  Findings: There is no evidence of significant pulmonary embolus. Evaluation  for pulmonary embolus is mildly suboptimal due to motion artifact.  Patchy left upper lobe and left lower lobe airspace opacities are concerning for pneumonia.  Additional minimal central airspace opacity is noted in the right lung.  There is no evidence of pleural effusion or pneumothorax.  No masses are identified; no abnormal focal contrast enhancement is seen.  The mediastinum is unremarkable in appearance.  No mediastinal lymphadenopathy is seen.  No pericardial effusion is identified. The great vessels are grossly unremarkable in appearance. Visualized perihilar nodes remain borderline normal in size. Biatrial enlargement is noted.  A left-sided AICD is noted, with a lead noted ending at the right ventricle.  No axillary lymphadenopathy is seen.  The visualized portions of the thyroid gland are unremarkable in appearance.  The visualized portions of the liver and spleen are unremarkable.  No acute osseous abnormalities are seen.  IMPRESSION:  1.  No evidence of significant pulmonary embolus. 2.  Multifocal pneumonia, predominantly on the left side. 3.  Biatrial enlargement noted.   Original Report Authenticated By: Tonia Ghent, M.D.     Medications: Scheduled: . allopurinol  100 mg Oral Daily  . aspirin EC  81 mg Oral Daily  . azithromycin  500 mg Oral Daily  . carvedilol  25 mg Oral BID WC  . cefTRIAXone (ROCEPHIN)  IV  1 g Intravenous Q24H  . digoxin  125 mcg Oral Daily  . enoxaparin (LOVENOX) injection  40 mg Subcutaneous Q24H  . fluticasone  2 spray Each Nare Daily  . insulin aspart  0-5 Units Subcutaneous QHS  . insulin aspart  3 Units Subcutaneous TID WC  . mometasone-formoterol  2 puff Inhalation BID  . montelukast  10 mg Oral Daily  . ramipril  5 mg Oral Daily  . saccharomyces boulardii  250 mg Oral BID  . simvastatin  40 mg Oral QHS  . sodium chloride  3 mL Intravenous Q12H  . torsemide  40 mg Oral BID   Continuous:     Assessment/Plan: Principal Problem:   Community  acquired pneumonia Active Problems:   DIABETES MELLITUS, TYPE II   Morbid obesity   OBSTRUCTIVE SLEEP APNEA   CONGESTIVE HEART FAILURE   Ventricular tachycardia   Hypoxia  CAP- IV Abx, O2, pulm toilet. Hypoxia/tachypnea and findings consistent with pneumonia with bronchospasm - [facility] rx. systolic heart failure/ejection fraction 25-30% per echo in July 2012 due to nonischemic cardiomyopathy - Diuretics and watching and BPs fine.   Volume status is stable. HTN - On mult meds.   type 2 diabetes mellitus - CBGs excellent. OSA on Currently on bilevel 17/13  HS.  history of ventricular tachycardia status post AICD placement in 2009. 5 run PVCs last night.  No shock. Morbid obesity - Needs sig weight off. Due to hyperkalemia - ACEi dosage dropped and Aldactone held Anticipate D/c  by Tuesday? DVT Prophylaxis    LOS: 1 day   Gary Hutchinson M 03/05/2013, 10:42 AM

## 2013-03-05 NOTE — Progress Notes (Signed)
Assisted pt. With placing on cpap. Pt. States that he wears cpap at home, but does not know his settings. Pt. Placed on 10cmh20 with 2L of oxygen. Pt. Is tolerating well at this time.

## 2013-03-06 ENCOUNTER — Inpatient Hospital Stay (HOSPITAL_COMMUNITY): Payer: Medicare Other

## 2013-03-06 LAB — CBC WITH DIFFERENTIAL/PLATELET
Basophils Absolute: 0 10*3/uL (ref 0.0–0.1)
Eosinophils Absolute: 0.2 10*3/uL (ref 0.0–0.7)
HCT: 39.9 % (ref 39.0–52.0)
Lymphocytes Relative: 45 % (ref 12–46)
MCHC: 32.1 g/dL (ref 30.0–36.0)
Neutro Abs: 1.8 10*3/uL (ref 1.7–7.7)
Neutrophils Relative %: 38 % — ABNORMAL LOW (ref 43–77)
RDW: 13.7 % (ref 11.5–15.5)

## 2013-03-06 LAB — COMPREHENSIVE METABOLIC PANEL
ALT: 14 U/L (ref 0–53)
Alkaline Phosphatase: 60 U/L (ref 39–117)
CO2: 31 mEq/L (ref 19–32)
Chloride: 94 mEq/L — ABNORMAL LOW (ref 96–112)
GFR calc Af Amer: 75 mL/min — ABNORMAL LOW (ref >90)
GFR calc non Af Amer: 65 mL/min — ABNORMAL LOW (ref >90)
Glucose, Bld: 186 mg/dL — ABNORMAL HIGH (ref 70–99)
Potassium: 3.5 mEq/L (ref 3.5–5.1)
Sodium: 135 mEq/L (ref 135–145)
Total Bilirubin: 0.4 mg/dL (ref 0.3–1.2)

## 2013-03-06 LAB — GLUCOSE, CAPILLARY
Glucose-Capillary: 150 mg/dL — ABNORMAL HIGH (ref 70–99)
Glucose-Capillary: 126 mg/dL — ABNORMAL HIGH (ref 70–99)

## 2013-03-06 MED ORDER — ALBUTEROL (5 MG/ML) CONTINUOUS INHALATION SOLN
2.5000 mg/h | INHALATION_SOLUTION | RESPIRATORY_TRACT | Status: DC | PRN
  Administered 2013-03-06 – 2013-03-08 (×5): 2.5 mg/h via RESPIRATORY_TRACT
  Filled 2013-03-06: qty 20

## 2013-03-06 MED ORDER — GUAIFENESIN ER 600 MG PO TB12
600.0000 mg | ORAL_TABLET | Freq: Two times a day (BID) | ORAL | Status: DC
  Administered 2013-03-06 – 2013-03-08 (×5): 600 mg via ORAL
  Filled 2013-03-06 (×6): qty 1

## 2013-03-06 MED ORDER — SPIRONOLACTONE 12.5 MG HALF TABLET
12.5000 mg | ORAL_TABLET | Freq: Every day | ORAL | Status: DC
  Administered 2013-03-06 – 2013-03-07 (×2): 12.5 mg via ORAL
  Filled 2013-03-06 (×5): qty 1

## 2013-03-06 NOTE — Progress Notes (Signed)
Subjective: Admitted  for CAP/Hypoxia and placed on Rocephin/Azithro, O2 and Pulm toilet. Due to hyperkalemia - ACEi dosage dropped and Aldactone held Wearing BiPAP at night. Current sat is 91% and he is still SOB c sig DOE. Sitting in chair this am.   Still Coughing sputum.  Objective: Vital signs in last 24 hours: Temp:  [98 F (36.7 C)-98.3 F (36.8 C)] 98.2 F (36.8 C) (06/01 0514) Pulse Rate:  [70-86] 70 (06/01 0514) Resp:  [18] 18 (06/01 0514) BP: (95-115)/(65-74) 95/65 mmHg (06/01 0514) SpO2:  [84 %-96 %] 84 % (06/01 0923) Weight:  [157.1 kg (346 lb 5.5 oz)] 157.1 kg (346 lb 5.5 oz) (06/01 0514) Weight change: -1 kg (-2 lb 3.3 oz) Last BM Date: 03/05/13  CBG (last 3)   Recent Labs  03/05/13 1628 03/05/13 2114 03/06/13 0537  GLUCAP 123* 127* 150*    Intake/Output from previous day:  Intake/Output Summary (Last 24 hours) at 03/06/13 1117 Last data filed at 03/06/13 0519  Gross per 24 hour  Intake    720 ml  Output   1000 ml  Net   -280 ml   05/31 0701 - 06/01 0700 In: 960 [P.O.:960] Out: 1275 [Urine:1275]   Physical Exam  General appearance: Alert and O.  Sitting in Chair.  Morbid Obesity.   Eyes: no scleral icterus Throat: oropharynx moist without erythema Resp: Distant Cardio: Reg. AICD in place. GI: soft, non-tender; bowel sounds normal; no masses,  no organomegaly. Obese. Extremities: no clubbing/cyanosis; min edema   Lab Results:  Recent Labs  03/03/13 2337 03/04/13 1049 03/05/13 0502 03/06/13 0435  NA 134* 135 137 135  K 5.5* 4.6 4.2 3.5  CL 97 96 94* 94*  CO2 27 28 30 31   GLUCOSE 144* 158* 142* 186*  BUN 14 15 20  29*  CREATININE 1.09 1.22 1.26 1.25  CALCIUM 9.1 9.2 9.4 9.5  MG  --  2.1  --   --   PHOS  --  4.1  --   --      Recent Labs  03/05/13 0502 03/06/13 0435  AST 25 23  ALT 14 14  ALKPHOS 62 60  BILITOT 0.5 0.4  PROT 8.0 7.6  ALBUMIN 3.8 3.7     Recent Labs  03/05/13 0502 03/06/13 0435  WBC 4.6 4.7   NEUTROABS 1.7 1.8  HGB 13.1 12.8*  HCT 40.9 39.9  MCV 88.9 88.1  PLT 130* 134*    No results found for this basename: INR,  PROTIME    No results found for this basename: CKTOTAL, CKMB, CKMBINDEX, TROPONINI,  in the last 72 hours   Recent Labs  03/04/13 1049  TSH 1.587    No results found for this basename: VITAMINB12, FOLATE, FERRITIN, TIBC, IRON, RETICCTPCT,  in the last 72 hours  Micro Results: No results found for this or any previous visit (from the past 240 hour(s)).   Studies/Results: Dg Chest 2 View  03/06/2013   *RADIOLOGY REPORT*  Clinical Data: Pneumonia  CHEST - 2 VIEW  Comparison: 03/04/2013  Findings: Stable cardiomegaly.  Left subclavian AICD stable position.  Mild patchy airspace opacities in the left lower lobe or lingula slightly improved.  Right lung remains clear.  No effusion. Regional bones unremarkable.  IMPRESSION:  1.  Improving patchy airspace disease at the left lung base.   Original Report Authenticated By: D. Andria Rhein, MD     Medications: Scheduled: . allopurinol  100 mg Oral Daily  . aspirin EC  81  mg Oral Daily  . azithromycin  500 mg Oral Daily  . carvedilol  25 mg Oral BID WC  . cefTRIAXone (ROCEPHIN)  IV  1 g Intravenous Q24H  . digoxin  125 mcg Oral Daily  . enoxaparin (LOVENOX) injection  40 mg Subcutaneous Q24H  . fluticasone  2 spray Each Nare Daily  . insulin aspart  0-5 Units Subcutaneous QHS  . insulin aspart  3 Units Subcutaneous TID WC  . mometasone-formoterol  2 puff Inhalation BID  . montelukast  10 mg Oral Daily  . ramipril  5 mg Oral Daily  . saccharomyces boulardii  250 mg Oral BID  . simvastatin  40 mg Oral QHS  . sodium chloride  3 mL Intravenous Q12H  . torsemide  40 mg Oral BID   Continuous:     Assessment/Plan: Principal Problem:   Community acquired pneumonia Active Problems:   DIABETES MELLITUS, TYPE II   Morbid obesity   OBSTRUCTIVE SLEEP APNEA   CONGESTIVE HEART FAILURE   Ventricular  tachycardia   Hypoxia  CAP- IV Abx, O2, pulm toilet. I added ICS and flutter valve.  He is improving.  Still needs FIO2.  Encouraged to ambulate more. Systolic heart failure/ejection fraction 25-30% per echo in July 2012 due to nonischemic cardiomyopathy - Diuretics and watching and BPs fine.   Volume status is stable. HTN - On mult meds.   type 2 diabetes mellitus - CBGs excellent. OSA on Currently on bilevel 17/13  HS.  history of ventricular tachycardia status post AICD placement in 2009. 5 run PVCs last night.  No shock. Morbid obesity - Needs sig weight off. Due to hyperkalemia - ACEi dosage dropped and Aldactone held.  Now K low and we can add the aldactone back. Anticipate D/c by Tuesday? DVT Prophylaxis    LOS: 2 days   Robecca Fulgham M 03/06/2013, 11:17 AM

## 2013-03-06 NOTE — Progress Notes (Signed)
Pt. States he can place the cpap on himself when he is ready for bed. Rt informed pt to notify if he needs any assistance.

## 2013-03-07 LAB — GLUCOSE, CAPILLARY
Glucose-Capillary: 125 mg/dL — ABNORMAL HIGH (ref 70–99)
Glucose-Capillary: 185 mg/dL — ABNORMAL HIGH (ref 70–99)
Glucose-Capillary: 99 mg/dL (ref 70–99)

## 2013-03-07 LAB — CBC WITH DIFFERENTIAL/PLATELET
Eosinophils Absolute: 0.1 10*3/uL (ref 0.0–0.7)
MCH: 29.2 pg (ref 26.0–34.0)
MCHC: 33.6 g/dL (ref 30.0–36.0)
MCV: 87 fL (ref 78.0–100.0)
Platelets: 143 10*3/uL — ABNORMAL LOW (ref 150–400)
RDW: 13.5 % (ref 11.5–15.5)

## 2013-03-07 LAB — COMPREHENSIVE METABOLIC PANEL
ALT: 14 U/L (ref 0–53)
AST: 25 U/L (ref 0–37)
Albumin: 3.6 g/dL (ref 3.5–5.2)
Alkaline Phosphatase: 58 U/L (ref 39–117)
BUN: 32 mg/dL — ABNORMAL HIGH (ref 6–23)
Chloride: 94 mEq/L — ABNORMAL LOW (ref 96–112)
Potassium: 4.4 mEq/L (ref 3.5–5.1)
Total Bilirubin: 0.4 mg/dL (ref 0.3–1.2)

## 2013-03-07 MED ORDER — ALBUTEROL SULFATE (5 MG/ML) 0.5% IN NEBU
INHALATION_SOLUTION | RESPIRATORY_TRACT | Status: AC
  Filled 2013-03-07: qty 0.5

## 2013-03-07 NOTE — Progress Notes (Signed)
Pt. States he can place the cpap on himself when he is ready for bed. RT informed pt. To notify if he needs any assistance. 

## 2013-03-07 NOTE — Progress Notes (Signed)
Subjective: Sitting in chair this am.   Still Coughing sputum but breaking up.  Getting relief from Nebs/IS and flutter valve No new c/o.  Objective: Vital signs in last 24 hours: Temp:  [97.4 F (36.3 C)-98.3 F (36.8 C)] 97.4 F (36.3 C) (06/02 0556) Pulse Rate:  [60-78] 64 (06/02 0556) Resp:  [18-20] 20 (06/02 0556) BP: (91-106)/(52-67) 91/63 mmHg (06/02 0556) SpO2:  [84 %-99 %] 97 % (06/02 0556) Weight:  [157.2 kg (346 lb 9 oz)] 157.2 kg (346 lb 9 oz) (06/02 0556) Weight change: 0.1 kg (3.5 oz) Last BM Date: 03/06/13  CBG (last 3)   Recent Labs  03/06/13 1618 03/06/13 2131 03/07/13 0629  GLUCAP 126* 125* 185*    Intake/Output from previous day:  Intake/Output Summary (Last 24 hours) at 03/07/13 0752 Last data filed at 03/07/13 1610  Gross per 24 hour  Intake    560 ml  Output   1775 ml  Net  -1215 ml   06/01 0701 - 06/02 0700 In: 560 [P.O.:560] Out: 1775 [Urine:1775]   Physical Exam  General appearance: Alert and O.  Sitting in Chair.  Morbid Obesity.   Eyes: no scleral icterus Throat: oropharynx moist without erythema Resp: Distant Cardio: Reg. AICD in place. GI: soft, non-tender; bowel sounds normal; no masses,  no organomegaly. Obese. Extremities: no clubbing/cyanosis; min edema   Lab Results:  Recent Labs  03/04/13 1049  03/06/13 0435 03/07/13 0454  NA 135  < > 135 138  K 4.6  < > 3.5 4.4  CL 96  < > 94* 94*  CO2 28  < > 31 32  GLUCOSE 158*  < > 186* 171*  BUN 15  < > 29* 32*  CREATININE 1.22  < > 1.25 1.22  CALCIUM 9.2  < > 9.5 9.3  MG 2.1  --   --   --   PHOS 4.1  --   --   --   < > = values in this interval not displayed.   Recent Labs  03/06/13 0435 03/07/13 0454  AST 23 25  ALT 14 14  ALKPHOS 60 58  BILITOT 0.4 0.4  PROT 7.6 7.7  ALBUMIN 3.7 3.6     Recent Labs  03/06/13 0435 03/07/13 0454  WBC 4.7 4.8  NEUTROABS 1.8 2.1  HGB 12.8* 13.0  HCT 39.9 38.7*  MCV 88.1 87.0  PLT 134* 143*    No results found for  this basename: INR,  PROTIME    No results found for this basename: CKTOTAL, CKMB, CKMBINDEX, TROPONINI,  in the last 72 hours   Recent Labs  03/04/13 1049  TSH 1.587    No results found for this basename: VITAMINB12, FOLATE, FERRITIN, TIBC, IRON, RETICCTPCT,  in the last 72 hours  Micro Results: No results found for this or any previous visit (from the past 240 hour(s)).   Studies/Results: Dg Chest 2 View  03/06/2013   *RADIOLOGY REPORT*  Clinical Data: Pneumonia  CHEST - 2 VIEW  Comparison: 03/04/2013  Findings: Stable cardiomegaly.  Left subclavian AICD stable position.  Mild patchy airspace opacities in the left lower lobe or lingula slightly improved.  Right lung remains clear.  No effusion. Regional bones unremarkable.  IMPRESSION:  1.  Improving patchy airspace disease at the left lung base.   Original Report Authenticated By: D. Andria Rhein, MD     Medications: Scheduled: . allopurinol  100 mg Oral Daily  . aspirin EC  81 mg Oral Daily  .  azithromycin  500 mg Oral Daily  . carvedilol  25 mg Oral BID WC  . cefTRIAXone (ROCEPHIN)  IV  1 g Intravenous Q24H  . digoxin  125 mcg Oral Daily  . enoxaparin (LOVENOX) injection  40 mg Subcutaneous Q24H  . fluticasone  2 spray Each Nare Daily  . guaiFENesin  600 mg Oral BID  . insulin aspart  0-5 Units Subcutaneous QHS  . insulin aspart  3 Units Subcutaneous TID WC  . mometasone-formoterol  2 puff Inhalation BID  . montelukast  10 mg Oral Daily  . ramipril  5 mg Oral Daily  . saccharomyces boulardii  250 mg Oral BID  . simvastatin  40 mg Oral QHS  . sodium chloride  3 mL Intravenous Q12H  . spironolactone  12.5 mg Oral Daily  . torsemide  40 mg Oral BID   Continuous:     Assessment/Plan: Principal Problem:   Community acquired pneumonia Active Problems:   DIABETES MELLITUS, TYPE II   Morbid obesity   OBSTRUCTIVE SLEEP APNEA   CONGESTIVE HEART FAILURE   Ventricular tachycardia   Hypoxia  CAP- IV Abx, O2, pulm  toilet. ICS, Nebs, and flutter valve.  He is improving.  Still needs FIO2.  Encouraged to ambulate more. Anticipate D/C in 1-2 days. Systolic heart failure/ejection fraction 25-30% per echo in July 2012 due to nonischemic cardiomyopathy - Diuretics and watching and BPs fine.   Volume status is stable. HTN - On mult meds.   type 2 diabetes mellitus - CBGs excellent. OSA on Currently on bilevel 17/13  HS.  history of ventricular tachycardia status post AICD placement in 2009. 5 run PVCs noted from the other night.  No shock. Morbid obesity - Needs sig weight off. Due to hyperkalemia on admit ACEi dosage dropped and Aldactone held.  K fine after meds readjusted. Anticipate D/c by Tuesday? DVT Prophylaxis    LOS: 3 days   Gary Hutchinson 03/07/2013, 7:52 AM

## 2013-03-08 DIAGNOSIS — R0902 Hypoxemia: Secondary | ICD-10-CM

## 2013-03-08 LAB — GLUCOSE, CAPILLARY
Glucose-Capillary: 133 mg/dL — ABNORMAL HIGH (ref 70–99)
Glucose-Capillary: 154 mg/dL — ABNORMAL HIGH (ref 70–99)

## 2013-03-08 LAB — COMPREHENSIVE METABOLIC PANEL
Albumin: 3.5 g/dL (ref 3.5–5.2)
Alkaline Phosphatase: 55 U/L (ref 39–117)
BUN: 28 mg/dL — ABNORMAL HIGH (ref 6–23)
CO2: 30 mEq/L (ref 19–32)
Chloride: 99 mEq/L (ref 96–112)
GFR calc non Af Amer: 83 mL/min — ABNORMAL LOW (ref >90)
Potassium: 3.8 mEq/L (ref 3.5–5.1)
Total Bilirubin: 0.3 mg/dL (ref 0.3–1.2)

## 2013-03-08 LAB — CBC WITH DIFFERENTIAL/PLATELET
Basophils Absolute: 0 10*3/uL (ref 0.0–0.1)
Eosinophils Absolute: 0.1 10*3/uL (ref 0.0–0.7)
Eosinophils Relative: 3 % (ref 0–5)
Lymphocytes Relative: 36 % (ref 12–46)
MCH: 28.6 pg (ref 26.0–34.0)
MCV: 86.8 fL (ref 78.0–100.0)
Platelets: 153 10*3/uL (ref 150–400)
RDW: 13.5 % (ref 11.5–15.5)
WBC: 4.5 10*3/uL (ref 4.0–10.5)

## 2013-03-08 MED ORDER — ACETAMINOPHEN 325 MG PO TABS: 650.0000 mg | ORAL_TABLET | Freq: Four times a day (QID) | ORAL | Status: DC | PRN

## 2013-03-08 MED ORDER — RAMIPRIL 5 MG PO CAPS
5.0000 mg | ORAL_CAPSULE | Freq: Every day | ORAL | Status: DC
  Administered 2013-03-08: 5 mg via ORAL

## 2013-03-08 MED ORDER — ALBUTEROL SULFATE (5 MG/ML) 0.5% IN NEBU
INHALATION_SOLUTION | RESPIRATORY_TRACT | Status: AC
  Filled 2013-03-08: qty 0.5

## 2013-03-08 MED ORDER — GUAIFENESIN ER 600 MG PO TB12: 1200.0000 mg | ORAL_TABLET | Freq: Two times a day (BID) | ORAL | Status: DC

## 2013-03-08 MED ORDER — RAMIPRIL 10 MG PO TABS: ORAL_TABLET | ORAL | Status: DC

## 2013-03-08 MED ORDER — SACCHAROMYCES BOULARDII 250 MG PO CAPS: 250.0000 mg | ORAL_CAPSULE | Freq: Two times a day (BID) | ORAL | Status: DC

## 2013-03-08 MED ORDER — CEFDINIR 300 MG PO CAPS: 300.0000 mg | ORAL_CAPSULE | Freq: Two times a day (BID) | ORAL | Status: DC

## 2013-03-08 MED ORDER — SPIRONOLACTONE 25 MG PO TABS: ORAL_TABLET | ORAL | Status: DC

## 2013-03-08 NOTE — Discharge Summary (Signed)
Physician Discharge Summary  DISCHARGE SUMMARY   Patient ID: Gary Hutchinson MR#: [id number] DOB/AGE: 03/06/1961 52 y.o.   Attending Physician:Odessie Polzin M  Patient's WUJ:WJXBJ,YNWG M, MD  Consults:  Heart Failure  Admit date: 03/04/2013 Discharge date: 03/08/2013  Discharge Diagnoses:  Principal Problem:   Community acquired pneumonia Active Problems:   DIABETES MELLITUS, TYPE II   Morbid obesity   OBSTRUCTIVE SLEEP APNEA   CONGESTIVE HEART FAILURE   Ventricular tachycardia   Hypoxia   Patient Active Problem List   Diagnosis Date Noted  . Community acquired pneumonia 03/04/2013  . Hypoxia 03/04/2013  . Chronic systolic congestive heart failure   . Hypertension   . Ventricular tachycardia   . IMPLANTATION OF DEFIBRILLATOR, HX OF 12/06/2009  . OBESITY, UNSPECIFIED 05/02/2008  . HYPERTENSION, PULMONARY 05/02/2008  . HEMORRHOIDS, INTERNAL 05/02/2008  . DIVERTICULOSIS OF COLON 05/02/2008  . ALCOHOL ABUSE, HX OF 05/02/2008  . CHOLECYSTITIS, UNSPECIFIED 04/10/2008  . DYSPEPSIA 04/04/2008  . RECTAL BLEEDING 11/05/2007  . ABDOMINAL PAIN 11/05/2007  . DIABETES MELLITUS, TYPE II 05/07/2007  . GOUT 05/07/2007  . Morbid obesity 05/07/2007  . ANEMIA-IRON DEFICIENCY 05/07/2007  . OBSTRUCTIVE SLEEP APNEA 05/07/2007  . HYPERTENSION 05/07/2007  . CONGESTIVE HEART FAILURE 05/07/2007  . ASTHMA 05/07/2007   Past Medical History  Diagnosis Date  . Cardiomyopathy     nonischemic  . Ventricular tachycardia     s/p MDT ICD implant  . Alcohol abuse     now quit  . Pulmonary hypertension   . Diverticulosis of colon   . Hemorrhoids, internal   . Cholecystitis   . Dyspepsia   . Morbid obesity   . Obstructive sleep apnea   . Anemia     iron defi  . Hypertension   . Gout   . Diabetes mellitus   . Chronic systolic congestive heart failure   . Asthma     Discharged Condition: stable   Discharge Medications:   Medication List    STOP taking these medications        metroNIDAZOLE 500 MG tablet  Commonly known as:  FLAGYL      TAKE these medications       acetaminophen 325 MG tablet  Commonly known as:  TYLENOL  Take 2 tablets (650 mg total) by mouth every 6 (six) hours as needed.     ADVAIR DISKUS 250-50 MCG/DOSE Aepb  Generic drug:  Fluticasone-Salmeterol  Inhale 1 puff into the lungs every 12 (twelve) hours as needed. For wheezing or shortness of breath     allopurinol 100 MG tablet  Commonly known as:  ZYLOPRIM  Take 1 tablet by mouth daily.     aspirin 81 MG tablet  Take 81 mg by mouth daily.     carvedilol 25 MG tablet  Commonly known as:  COREG  Take 1 tablet (25 mg total) by mouth 2 (two) times daily with a meal.     cefdinir 300 MG capsule  Commonly known as:  OMNICEF  Take 1 capsule (300 mg total) by mouth 2 (two) times daily.     colchicine 0.6 MG tablet  Take 0.6 mg by mouth daily as needed. Gout flare ups     digoxin 0.25 MG tablet  Commonly known as:  LANOXIN  Take 0.5 tablets (125 mcg total) by mouth daily.     fluticasone 50 MCG/ACT nasal spray  Commonly known as:  FLONASE  Place 2 sprays into the nose as needed.     glipizide-metformin 2.5-250 MG  per tablet  Commonly known as:  METAGLIP  Take 1 tablet by mouth 2 (two) times daily before a meal.     guaiFENesin 600 MG 12 hr tablet  Commonly known as:  MUCINEX  Take 2 tablets (1,200 mg total) by mouth 2 (two) times daily.     HYDROcodone-acetaminophen 5-325 MG per tablet  Commonly known as:  NORCO/VICODIN  Take 2 tablets by mouth every 4 (four) hours as needed for pain.     montelukast 10 MG tablet  Commonly known as:  SINGULAIR  Take 10 mg by mouth daily as needed. For shortness of breath or wheezing     PROAIR HFA 108 (90 BASE) MCG/ACT inhaler  Generic drug:  albuterol  Inhale 2 puffs into the lungs every 6 (six) hours as needed. For wheezing or shortness of breath     ramipril 10 MG tablet  Commonly known as:  ALTACE  Take 1/2 tablet (5 mg total) by  mouth daily.     saccharomyces boulardii 250 MG capsule  Commonly known as:  FLORASTOR  Take 1 capsule (250 mg total) by mouth 2 (two) times daily.     simvastatin 40 MG tablet  Commonly known as:  ZOCOR  Take 40 mg by mouth at bedtime.     spironolactone 25 MG tablet  Commonly known as:  ALDACTONE  Restart 0.5 tablets (12.5 mg total) by mouth daily on June 10.     torsemide 20 MG tablet  Commonly known as:  DEMADEX  Take 40 mg by mouth 2 (two) times daily.     traMADol 50 MG tablet  Commonly known as:  ULTRAM  Take 1 tablet (50 mg total) by mouth every 8 (eight) hours as needed.        Hospital Procedures: Dg Chest 2 View  03/06/2013   *RADIOLOGY REPORT*  Clinical Data: Pneumonia  CHEST - 2 VIEW  Comparison: 03/04/2013  Findings: Stable cardiomegaly.  Left subclavian AICD stable position.  Mild patchy airspace opacities in the left lower lobe or lingula slightly improved.  Right lung remains clear.  No effusion. Regional bones unremarkable.  IMPRESSION:  1.  Improving patchy airspace disease at the left lung base.   Original Report Authenticated By: D. Andria Rhein, MD   Dg Chest 2 View  03/04/2013   *RADIOLOGY REPORT*  Clinical Data: Congestion and shortness of breath; low grade fever.  CHEST - 2 VIEW  Comparison: Chest radiograph performed 07/13/2011  Findings: The lungs are relatively well expanded.  Vascular congestion is noted, with diffusely increased interstitial markings and left basilar airspace opacity.  This may reflect pneumonia or possibly pulmonary edema.  No pleural effusion or pneumothorax is seen.  The cardiomediastinal silhouette is enlarged.  An AICD is noted at the left chest wall, with a single lead ending at the right ventricle.  No acute osseous abnormalities are seen.  IMPRESSION: Vascular congestion and cardiomegaly, with diffusely increased interstitial markings and left basilar airspace opacity.  This may reflect pneumonia or possibly pulmonary edema.    Original Report Authenticated By: Tonia Ghent, M.D.   Ct Angio Chest Pe W/cm &/or Wo Cm  03/04/2013   *RADIOLOGY REPORT*  Clinical Data: Shortness of breath and low grade fever; tachypnea and tachycardia.  CT ANGIOGRAPHY CHEST  Technique:  Multidetector CT imaging of the chest using the standard protocol during bolus administration of intravenous contrast. Multiplanar reconstructed images including MIPs were obtained and reviewed to evaluate the vascular anatomy.  Contrast: OMNIPAQUE IOHEXOL  350 MG/ML SOLN  Comparison: CTA of the chest performed 04/06/2011, and chest radiograph performed earlier today at 02:26 a.m.  Findings: There is no evidence of significant pulmonary embolus. Evaluation for pulmonary embolus is mildly suboptimal due to motion artifact.  Patchy left upper lobe and left lower lobe airspace opacities are concerning for pneumonia.  Additional minimal central airspace opacity is noted in the right lung.  There is no evidence of pleural effusion or pneumothorax.  No masses are identified; no abnormal focal contrast enhancement is seen.  The mediastinum is unremarkable in appearance.  No mediastinal lymphadenopathy is seen.  No pericardial effusion is identified. The great vessels are grossly unremarkable in appearance. Visualized perihilar nodes remain borderline normal in size. Biatrial enlargement is noted.  A left-sided AICD is noted, with a lead noted ending at the right ventricle.  No axillary lymphadenopathy is seen.  The visualized portions of the thyroid gland are unremarkable in appearance.  The visualized portions of the liver and spleen are unremarkable.  No acute osseous abnormalities are seen.  IMPRESSION:  1.  No evidence of significant pulmonary embolus. 2.  Multifocal pneumonia, predominantly on the left side. 3.  Biatrial enlargement noted.   Original Report Authenticated By: Tonia Ghent, M.D.    History of Present Illness: Tykee is a 52 yo male with multiple issues  including morbid obesity and nonischemic cardiomyopathy. He presented to med attention on 03/04/13 c one week of feeling poorly. He developed a cough and low grade temperature. He had a lot of congestion in his chest with some wheezing. The cough was initially minimally productive. He denied any blood from above or below and he denied any nausea, vomiting or diarrhea. In the ER and he was found to be hypoxic and tachypneic. RA oxygen saturation was 88%. CT scan chest was done and pending at time of admit. Given these findings he was admitted for a clinical PNA and started on empiric Abx.   Hospital Course: Admitted 03/04/13 by Dr Waynard Edwards for CAP/Hypoxia/Myalgias/Temp up to 103/Dyspnea and placed on Rocephin/Azithro, O2 nebs, BiPAP HS, Incentive Spirometry, Flutter Valve, and Pulm toilet.  Due to hyperkalemia - ACEi dosage dropped and Aldactone held.  CT C/W 1. No evidence of significant pulmonary embolus. 2. Multifocal pneumonia, predominantly on the left side. 3. Biatrial enlargement noted.  He was admitted to a Tele bed and with conservative management he slowly improved.  No Fever since admit.  White count was never an issue.  He remained HD stable.  He started coughing up more sputum and had it break up more and more daily.  On 03/08/13 he was able to be switched to PO Abx and D/ced home.  The only issue is Lowish BP and meds adjusted.  [employer] called to have ICD interrogated  Heart Failure Team came by and saw pt in hospital per my request and he was not deemed to be in acute CHF. 25-30% per echo in July 2012, nonischemic cardiomyopathy.  Admit weight 348 pounds. Optivol Interrogation - 03/04/13 Crossings noted but headed back to baseline today. From a heart failure perspective volume status is stable. Optivol interrogated with no crossing. Continue current diuretic regimen. Follow renal function closely. Potassium 5.5. Will cut ramipril in half to 5 mg daily. There was no cardiac issues during his hospital  stay except a minor run of 5 PVCs in a row.  His AICD tolerated this without issue. Cards followed from a distance.  CAP- IV Abx were provided via Rocepin/Azithro and  he can go home on 10 more days of oral Cefdinir 300 BID, O2 was provided and since he does not have any further tachypnea and sats are 98% + this has been weaneed, pulm toilet via ICS, Nebs, and flutter valve were very helpful. He is improving daily. We had to encourage him to ambulate more. Again he is now doing well enough to go home and finish out Abx course.  i gave him the choice of D/c today or tomorrow and he chose today. CXR on 6/1: Improving patchy airspace disease at the left lung base.   Systolic heart failure/ejection fraction 25-30% per echo in July 2012 due to nonischemic cardiomyopathy - Diuretics/Coreg/ACEi and Aldactone. Watch BPs  - a little low this am and can go back to baseline Lasix dosing and hold aldactone for 1 week.  Volume status is stable. ACEi dose was adjusted as well.  HTN - On mult meds.  Type 2 diabetes mellitus - CBGs excellent. D/C on prior meds OSA on Currently on bilevel 17/13 HS. He used the machine in the hospital.  history of ventricular tachycardia status post AICD placement in 2009. 5 run PVC noted from the other night. No shock.  Morbid obesity - Needs sig weight off.  Due to hyperkalemia on admit ACEi dosage dropped and Aldactone held. K fine after meds readjusted. 3.8 now. Cr 1.02 and fine. D/c today 03/08/13 DVT Prophylaxis was provided.  See 03/08/13 on am rounds and he is doing well s new C/O    Day of Discharge Exam BP 112/76  Pulse 65  Temp(Src) 98 F (36.7 C) (Oral)  Resp 19  Ht 5\' 6"  (1.676 m)  Wt 157.58 kg (347 lb 6.4 oz)  BMI 56.1 kg/m2  SpO2 96%  Physical Exam: General appearance: Alert and O. Sitting in Chair. Morbid Obesity. In good spirits Eyes: no scleral icterus  Resp: Distant but clear Cardio: Reg. AICD in place.  GI: soft, non-tender; bowel sounds normal; no  masses, no organomegaly. Obese.  Extremities: no clubbing/cyanosis; 0 edema   Discharge Labs:  Recent Labs  03/07/13 0454 03/08/13 0600  NA 138 138  K 4.4 3.8  CL 94* 99  CO2 32 30  GLUCOSE 171* 147*  BUN 32* 28*  CREATININE 1.22 1.02  CALCIUM 9.3 8.9    Recent Labs  03/07/13 0454 03/08/13 0600  AST 25 19  ALT 14 15  ALKPHOS 58 55  BILITOT 0.4 0.3  PROT 7.7 7.6  ALBUMIN 3.6 3.5    Recent Labs  03/07/13 0454 03/08/13 0600  WBC 4.8 4.5  NEUTROABS 2.1 2.3  HGB 13.0 13.0  HCT 38.7* 39.5  MCV 87.0 86.8  PLT 143* 153   No results found for this basename: CKTOTAL, CKMB, CKMBINDEX, TROPONINI,  in the last 72 hours No results found for this basename: TSH, T4TOTAL, FREET3, T3FREE, THYROIDAB,  in the last 72 hours No results found for this basename: VITAMINB12, FOLATE, FERRITIN, TIBC, IRON, RETICCTPCT,  in the last 72 hours No results found for this basename: INR,  PROTIME       Discharge instructions:  Future Appointments Provider Department Dept Phone   03/22/2013 8:00 AM [facility] Nurse [facility] [phone]   03/24/2013 2:20 PM [facility] [phone]   05/11/2013 10:00 AM Hillis Range, MD [facility] [place]) [phone]   11/11/2013 9:30 AM Barbaraann Share, MD [facility] [phone]  01-Home or Self Care Follow-up Information   Follow up with [name], MD In 7 days.   Contact information:   [address] [facility] [place] [address] [phone]       Follow up with Arvilla Meres, MD. Schedule an appointment as soon as possible for a visit on 03/24/2013. (at [employer] [address])    Contact information:   [address] [place] [address] [phone]        Disposition: home  Follow-up Appts: Follow-up with Dr. [name] at [facility] in 1  week.  Call for appointment.  Condition on Discharge: stable  Tests Needing Follow-up: Labs and CXR.  Time spent in discharge (includes decision making & examination of pt): 35 min  Signed: Kipp Shank M 03/08/2013, 1:32 PM

## 2013-03-08 NOTE — Progress Notes (Signed)
DC IV, DC Tele, DC Home. Discharge instructions and home medications discussed with patient. Patient denied any questions or concerns at this time. Patient leaving unit via wheelchair and appears in no acute distress.  

## 2013-03-08 NOTE — Progress Notes (Signed)
Gary Hutchinson is doing well and ready for D/c. BP a little soft this am due to overdiuresis. Will hold am meds and check back in @ lunchtime and if stable - OK for D/c. D/C summary already done and Incomplete/pending. D/c paperwork done.

## 2013-03-15 ENCOUNTER — Encounter: Payer: Self-pay | Admitting: Internal Medicine

## 2013-03-22 ENCOUNTER — Ambulatory Visit (HOSPITAL_COMMUNITY)
Admission: RE | Admit: 2013-03-22 | Discharge: 2013-03-22 | Disposition: A | Discharge status: Home / Self Care | Payer: Medicare Other | Source: Ambulatory Visit | Attending: Internal Medicine | Admitting: Internal Medicine

## 2013-03-22 ENCOUNTER — Encounter (HOSPITAL_COMMUNITY): Payer: Self-pay | Admitting: *Deleted

## 2013-03-22 DIAGNOSIS — I5022 Chronic systolic (congestive) heart failure: Secondary | ICD-10-CM

## 2013-03-22 NOTE — Progress Notes (Signed)
Heart Failure Diagnostics Follow Up Form - [employer]    Fluid Index        _X__   Fluid index trending with baseline       ___   Fluid index trending up but below threshold       ___   Fluid index above threshold       _X__   More than 3 threshold crossings this year        Current diuretic dose: Marland Kitchen                                                                                                  Baseline weight: .                                      Current Weight: .                                     Patient Activity        Current activity level: .           3 hours daily                                               Baseline activity level: .         3 hours daily                                                 AT/AF Burden        ___  Increase in AT/AF burden       ___  New onset of AT/AF       _X__  No AT/AF         ICD Therapies        ___ 1 or more ICD shocks    Interventions       ___  Adjust medications: .                                                                                                   ___  Labs: .  ___  Need for follow up appointment: .                                                                                _X__  HF diagnostics follow up: .    July 21st                                                                                      ___  Review with EP: Marland Kitchen                                                                                                         Patient notified: Date: .     03/22/13                           By: .       Phone   .  X     Letter

## 2013-03-24 ENCOUNTER — Ambulatory Visit (HOSPITAL_COMMUNITY): Payer: Medicare Other

## 2013-03-28 ENCOUNTER — Ambulatory Visit (HOSPITAL_COMMUNITY)
Admission: RE | Admit: 2013-03-28 | Discharge: 2013-03-28 | Disposition: A | Discharge status: Home / Self Care | Payer: Medicare Other | Source: Ambulatory Visit | Attending: Internal Medicine | Admitting: Internal Medicine

## 2013-03-28 ENCOUNTER — Encounter (HOSPITAL_COMMUNITY): Payer: Self-pay

## 2013-03-28 VITALS — BP 110/70 | HR 94 | Wt 349.1 lb

## 2013-03-28 DIAGNOSIS — I428 Other cardiomyopathies: Secondary | ICD-10-CM | POA: Insufficient documentation

## 2013-03-28 DIAGNOSIS — I5022 Chronic systolic (congestive) heart failure: Secondary | ICD-10-CM | POA: Insufficient documentation

## 2013-03-28 DIAGNOSIS — K648 Other hemorrhoids: Secondary | ICD-10-CM | POA: Insufficient documentation

## 2013-03-28 DIAGNOSIS — Z95810 Presence of automatic (implantable) cardiac defibrillator: Secondary | ICD-10-CM | POA: Insufficient documentation

## 2013-03-28 DIAGNOSIS — K5730 Diverticulosis of large intestine without perforation or abscess without bleeding: Secondary | ICD-10-CM | POA: Insufficient documentation

## 2013-03-28 DIAGNOSIS — Z79899 Other long term (current) drug therapy: Secondary | ICD-10-CM | POA: Insufficient documentation

## 2013-03-28 DIAGNOSIS — J45909 Unspecified asthma, uncomplicated: Secondary | ICD-10-CM | POA: Insufficient documentation

## 2013-03-28 DIAGNOSIS — G4733 Obstructive sleep apnea (adult) (pediatric): Secondary | ICD-10-CM | POA: Insufficient documentation

## 2013-03-28 DIAGNOSIS — E119 Type 2 diabetes mellitus without complications: Secondary | ICD-10-CM | POA: Insufficient documentation

## 2013-03-28 DIAGNOSIS — I509 Heart failure, unspecified: Secondary | ICD-10-CM

## 2013-03-28 DIAGNOSIS — I10 Essential (primary) hypertension: Secondary | ICD-10-CM | POA: Insufficient documentation

## 2013-03-28 MED ORDER — BISOPROLOL FUMARATE 10 MG PO TABS: 10.0000 mg | ORAL_TABLET | Freq: Every day | ORAL | Status: DC

## 2013-03-28 NOTE — Progress Notes (Signed)
Patient ID: Gary Hutchinson, male   DOB: [birth year], 52 y.o.   MRN: [id number] PCP: Creola Corn EP: Hillis Range  HPI:  Gary Hutchinson is a 52 year old male with a history of systolic heart failure, ejection fraction 25-30% per echo in July 2012, NICM, HTN, DM II,  OSA on CPAP, history of ventricular tachycardia status post AICD placement in 2009, and morbid obesity.    04/16/12  MC Admitted and treated for volume overload with subsuquent re-admit the next week for volume overload. Sluggish response to IV Lasix and placed on Milrinone. D/C weight 151 kg.  Echo 05/03/12: EF 25-30%.  Grade 1 diastolic dysfunction.  Mild MR.  Mod dilated LA.    Admitted to [facility] 03/04/13-03/08/13 for CAP confirmed CT of chest.  D/C weight 347 pounds.   He returns for follow up today.  Overall he feels better. He  Had f/u with Dr [name] 2 weeks ago and and his CXR has improved. Denies SOB/PND. Uses CPAP at night. Weight at home 339 pounds. He has not required additional torsemide. Compliant with medications. Not exercising currently. Spironolactone continues at 12.5 mg daily due to hyperkalemia noted when increased to 25 mg daily.    Optivol low no threshold crossings.  Activity 3-4 hours. No AF.    ROS: All systems negative except as listed in HPI, PMH and Problem List.  Past Medical History  Diagnosis Date  . Cardiomyopathy     nonischemic  . Ventricular tachycardia     s/p MDT ICD implant  . Alcohol abuse     now quit  . Pulmonary hypertension   . Diverticulosis of colon   . Hemorrhoids, internal   . Cholecystitis   . Dyspepsia   . Morbid obesity   . Obstructive sleep apnea   . Anemia     iron defi  . Hypertension   . Gout   . Diabetes mellitus   . Chronic systolic congestive heart failure   . Asthma     Current Outpatient Prescriptions  Medication Sig Dispense Refill  . acetaminophen (TYLENOL) 325 MG tablet Take 2 tablets (650 mg total) by mouth every 6 (six) hours as needed.      Marland Kitchen albuterol (PROAIR  HFA) 108 (90 BASE) MCG/ACT inhaler Inhale 2 puffs into the lungs every 6 (six) hours as needed. For wheezing or shortness of breath      . allopurinol (ZYLOPRIM) 100 MG tablet Take 1 tablet by mouth daily.      Marland Kitchen aspirin 81 MG tablet Take 81 mg by mouth daily.      . carvedilol (COREG) 25 MG tablet Take 1 tablet (25 mg total) by mouth 2 (two) times daily with a meal.  60 tablet  3  . cefdinir (OMNICEF) 300 MG capsule Take 1 capsule (300 mg total) by mouth 2 (two) times daily.  20 capsule  0  . colchicine 0.6 MG tablet Take 0.6 mg by mouth daily as needed. Gout flare ups      . digoxin (LANOXIN) 0.25 MG tablet Take 0.5 tablets (125 mcg total) by mouth daily.  30 tablet  6  . fluticasone (FLONASE) 50 MCG/ACT nasal spray Place 2 sprays into the nose as needed.       . Fluticasone-Salmeterol (ADVAIR DISKUS) 250-50 MCG/DOSE AEPB Inhale 1 puff into the lungs every 12 (twelve) hours as needed. For wheezing or shortness of breath      . glipizide-metformin (METAGLIP) 2.5-250 MG per tablet Take 1 tablet by mouth 2 (  two) times daily before a meal.        . guaiFENesin (MUCINEX) 600 MG 12 hr tablet Take 2 tablets (1,200 mg total) by mouth 2 (two) times daily.      Marland Kitchen HYDROcodone-acetaminophen (NORCO/VICODIN) 5-325 MG per tablet Take 2 tablets by mouth every 4 (four) hours as needed for pain.  6 tablet  0  . ramipril (ALTACE) 10 MG tablet Take 1/2 tablet (5 mg total) by mouth daily.      Marland Kitchen saccharomyces boulardii (FLORASTOR) 250 MG capsule Take 1 capsule (250 mg total) by mouth 2 (two) times daily.  30 capsule  0  . simvastatin (ZOCOR) 40 MG tablet Take 40 mg by mouth at bedtime.        Marland Kitchen spironolactone (ALDACTONE) 25 MG tablet Restart 0.5 tablets (12.5 mg total) by mouth daily on June 10.      . torsemide (DEMADEX) 20 MG tablet Take 40 mg by mouth 2 (two) times daily.       . montelukast (SINGULAIR) 10 MG tablet Take 10 mg by mouth daily as needed. For shortness of breath or wheezing      . traMADol (ULTRAM)  50 MG tablet Take 1 tablet (50 mg total) by mouth every 8 (eight) hours as needed.  20 tablet  0   No current facility-administered medications for this encounter.    PHYSICAL EXAM: Filed Vitals:   03/28/13 0831  BP: 110/70  Pulse: 94  Weight: 349 lb 1.9 oz (158.36 kg)  SpO2: 95%   General:  Well appearing. No resp difficulty HEENT: normal Neck: supple. JVP hard to see but does not appear elevated. Carotids 2+ bilaterally; no bruits. No lymphadenopathy or thryomegaly appreciated. Cor: PMI nonpalpable. Regular rate & rhythm. No rubs, gallops or 2/6 TR murmur. Lungs: clear Abdomen: obese, soft, nontender, nondistended.  Good bowel sounds. Extremities: no cyanosis, clubbing, rash, edema Neuro: alert & orientedx3, cranial nerves grossly intact. Moves all 4 extremities w/o difficulty. Affect pleasant.   ASSESSMENT & PLAN:

## 2013-03-28 NOTE — Assessment & Plan Note (Signed)
Continue CPAP.  

## 2013-03-28 NOTE — Assessment & Plan Note (Signed)
NYHA II. Volume status stable and confirmed with optivol readings. Continue current diuretic regimen. Given chronic pulmonary problems -asthma will stop carvedilol and start cardio selective beta blocker- bisoprolol 10 mg daily. Spironolactone continues at 12.5 mg daily due to hyperkalemia noted when increased to 25 mg daily. Reinforced daily weights, low salt food choices, and limiting fluid intake to < 2 liters per day. Follow up in 3-4 months with an ECHO.

## 2013-03-28 NOTE — Assessment & Plan Note (Signed)
Provided information for possible bariatric surgery. He will contact to schedule consultation.

## 2013-03-28 NOTE — Patient Instructions (Addendum)
Stop carvedilol   Take bisoprolol 10 mg daily  Follow up in 3-4 months  Do the following things EVERYDAY: 1) Weigh yourself in the morning before breakfast. Write it down and keep it in a log. 2) Take your medicines as prescribed 3) Eat low salt foods-Limit salt (sodium) to 2000 mg per day.  4) Stay as active as you can everyday 5) Limit all fluids for the day to less than 2 liters

## 2013-04-26 ENCOUNTER — Ambulatory Visit (HOSPITAL_COMMUNITY)
Admission: RE | Admit: 2013-04-26 | Discharge: 2013-04-26 | Disposition: A | Discharge status: Home / Self Care | Payer: Medicare Other | Source: Ambulatory Visit | Attending: Cardiology | Admitting: Cardiology

## 2013-04-26 DIAGNOSIS — I5022 Chronic systolic (congestive) heart failure: Secondary | ICD-10-CM

## 2013-04-26 NOTE — Progress Notes (Signed)
Heart Failure Diagnostics Follow Up Form - [employer]    Fluid Index        ___   Fluid index trending with baseline       ___   Fluid index trending up but below threshold       _X__   Fluid index above threshold       ___   More than 3 threshold crossings this year        Current diuretic dose: .    Torsemide 40 mg BID, Spiro 12.5 daily                                                                                              Baseline weight: .                                      Current Weight: .                                     Patient Activity        Current activity level: .            2 hours/day                                              Baseline activity level: .          2 & 1/2 hours/day                                                AT/AF Burden        ___  Increase in AT/AF burden       ___  New onset of AT/AF       _X__  No AT/AF         ICD Therapies        ___ 1 or more ICD shocks    Interventions       _X__  Adjust medications: . Will take extra 40 mg of Torsemide today, pt has not been weighing daily, he denies SOB and edema, he will begin weighing daily starting tomorrow and will let me know if he dev SOB or edema  ___  Labs: .                                                                                                                          ___  Need for follow up appointment: .                                                                                _X_  HF diagnostics follow up: .       1 month, 8/25                                                                                   ___  Review with EP: Marland Kitchen                                                                                                         Patient notified: Date: .  04/26/13                              By: .  Juliann Pares     Phone   .       Letter

## 2013-05-10 ENCOUNTER — Encounter: Payer: Self-pay | Admitting: Internal Medicine

## 2013-05-11 ENCOUNTER — Encounter: Payer: Medicare Other | Admitting: Internal Medicine

## 2013-05-30 ENCOUNTER — Encounter: Payer: Self-pay | Admitting: Internal Medicine

## 2013-05-31 ENCOUNTER — Ambulatory Visit (HOSPITAL_COMMUNITY)
Admission: RE | Admit: 2013-05-31 | Discharge: 2013-05-31 | Disposition: A | Discharge status: Home / Self Care | Payer: Medicare Other | Source: Ambulatory Visit | Attending: Internal Medicine | Admitting: Internal Medicine

## 2013-05-31 DIAGNOSIS — I5022 Chronic systolic (congestive) heart failure: Secondary | ICD-10-CM

## 2013-05-31 DIAGNOSIS — I509 Heart failure, unspecified: Secondary | ICD-10-CM

## 2013-05-31 MED ORDER — METOLAZONE 2.5 MG PO TABS: 2.5000 mg | ORAL_TABLET | ORAL | Status: DC

## 2013-05-31 NOTE — Progress Notes (Signed)
Heart Failure Diagnostics Follow Up Form - [employer]    Fluid Index        ___   Fluid index trending with baseline       ___   Fluid index trending up but below threshold       _X__   Fluid index above threshold       ___   More than 3 threshold crossings this year        Current diuretic dose: .       Torsemide 40 mg BID Spiro 12.5 mg Daily                                                                                           Baseline weight: .                                      Current Weight: .                                     Patient Activity        Current activity level: .          2 & 1/2 hours daily                                                Baseline activity level: .        2-4 hours daily                                                  AT/AF Burden        ___  Increase in AT/AF burden       ___  New onset of AT/AF       _X__  No AT/AF         ICD Therapies        ___ 1 or more ICD shocks    Interventions       _X__  Adjust medications: . Reviewed with Dr Gala Romney, will give pt metolazone 2.5 mg for 2 days, pt aware and agreeable, he states abd is bloated he does not weigh daily, he will let me know in a few days if not feeling well  ___  Labs: .                                                                                                                          ___  Need for follow up appointment: .                                                                                ___  HF diagnostics follow up: .                                                                                          ___  Review with EP: Marland Kitchen                                                                                                         Patient notified: Date: .     05/31/13                           By: .   Juliann Pares    Phone   .       Letter

## 2013-06-03 ENCOUNTER — Telehealth (HOSPITAL_COMMUNITY): Payer: Self-pay | Admitting: *Deleted

## 2013-06-03 NOTE — Telephone Encounter (Signed)
Pt called to report he was feeling so much better since he took the Metolazone, he states that he didn't realize how bad he had felt until he lost so much fluid and feels so much better now, he will let me know if fluid starts building up or he begins to feel bad again

## 2013-06-08 ENCOUNTER — Encounter: Payer: Self-pay | Admitting: Internal Medicine

## 2013-06-13 ENCOUNTER — Encounter: Payer: Self-pay | Admitting: Internal Medicine

## 2013-06-17 ENCOUNTER — Encounter: Payer: Medicare Other | Admitting: Cardiology

## 2013-06-17 ENCOUNTER — Encounter: Payer: Medicare Other | Admitting: Internal Medicine

## 2013-06-21 ENCOUNTER — Encounter: Payer: Self-pay | Admitting: Internal Medicine

## 2013-07-04 ENCOUNTER — Encounter: Payer: Self-pay | Admitting: Internal Medicine

## 2013-07-05 ENCOUNTER — Ambulatory Visit (HOSPITAL_COMMUNITY)
Admission: RE | Admit: 2013-07-05 | Discharge: 2013-07-05 | Disposition: A | Discharge status: Home / Self Care | Payer: Medicare Other | Source: Ambulatory Visit | Attending: Internal Medicine | Admitting: Internal Medicine

## 2013-07-05 ENCOUNTER — Encounter: Payer: Medicare Other | Admitting: Cardiology

## 2013-07-05 DIAGNOSIS — I5022 Chronic systolic (congestive) heart failure: Secondary | ICD-10-CM

## 2013-07-05 DIAGNOSIS — I509 Heart failure, unspecified: Secondary | ICD-10-CM

## 2013-07-05 NOTE — Progress Notes (Signed)
Heart Failure Diagnostics Follow Up Form - [employer]    Fluid Index        ___   Fluid index trending with baseline       ___   Fluid index trending up but below threshold       _X_   Fluid index above threshold       _X__   More than 3 threshold crossings this year        Current diuretic dose: . Torsemide 40 mg bid, metolazone 2.5 mg prn                                                                                                 Baseline weight: .                                      Current Weight: .                                     Patient Activity        Current activity level: .        2 hours daily                                                  Baseline activity level: .      2 & 1/2 hours daily                                                   AT/AF Burden        ___  Increase in AT/AF burden       ___  New onset of AT/AF       ___  No AT/AF         ICD Therapies        ___ 1 or more ICD shocks    Interventions       _X__  Adjust medications: .  Will take metolazone today and tomorrow                                                                                                 _X__  Labs: Marland Kitchen   At appt 10/22  _X_  Need for follow up appointment: .     sch for f/u on 10/22                                                                           _X__  HF diagnostics follow up: .       11/3                                                                                   ___  Review with EP: Marland Kitchen                                                                                                         Patient notified: Date: .     07/05/13                           By: . Juliann Pares      Phone   .       Letter

## 2013-07-15 ENCOUNTER — Encounter: Payer: Medicare Other | Admitting: Cardiology

## 2013-07-18 ENCOUNTER — Encounter: Payer: Self-pay | Admitting: Internal Medicine

## 2013-07-26 NOTE — Progress Notes (Signed)
Patient ID: Gary Hutchinson, male   DOB: 13-Jan-1961, 52 y.o.   MRN: [id number] PCP: Creola Corn EP: Hillis Range  HPI:  Mr. Sedano is a 52 year old male with a history of chronic systolic heart failure, EF 25-30% (04/2012), NICM, HTN, DM II,  OSA on CPAP, history of ventricular tachycardia status post AICD placement in 2009, and morbid obesity.    04/16/12  MC Admitted and treated for volume overload with subsuquent re-admit the next week for volume overload. Sluggish response to IV Lasix and placed on Milrinone. D/C weight 151 kg.  Echo 05/03/12: EF 25-30%.  Grade 1 diastolic dysfunction.  Mild MR.  Mod dilated LA.          07/27/13: EF 40%, diff HK, MR mild, LA mild/mod dilated  Admitted to [facility] 03/04/13-03/08/13 for CAP confirmed CT of chest.  D/C weight 347 pounds.   Follow up: Last visit changed coreg to bisoprolol. Doing pretty good. Denies SOB, orthopnea, or PND. Weight at home 342 lbs. Has needed metolazone for extra weight gain. Compliant with medications. Not exercising currently d/t knee pain. Got a stationary bicycle. Spironolactone continues at 12.5 mg daily due to hyperkalemia noted when increased to 25 mg daily.   Optivol: 1 crossing over threshold, but now back down. No afib/flutter. Patient activity 2-4 hours a day.   ROS: All systems negative except as listed in HPI, PMH and Problem List.  Past Medical History  Diagnosis Date  . Cardiomyopathy     nonischemic  . Ventricular tachycardia     s/p MDT ICD implant  . Alcohol abuse     now quit  . Pulmonary hypertension   . Diverticulosis of colon   . Hemorrhoids, internal   . Cholecystitis   . Dyspepsia   . Morbid obesity   . Obstructive sleep apnea   . Anemia     iron defi  . Hypertension   . Gout   . Diabetes mellitus   . Chronic systolic congestive heart failure   . Asthma     Current Outpatient Prescriptions  Medication Sig Dispense Refill  . acetaminophen (TYLENOL) 325 MG tablet Take 2 tablets (650 mg total) by  mouth every 6 (six) hours as needed.      Marland Kitchen albuterol (PROAIR HFA) 108 (90 BASE) MCG/ACT inhaler Inhale 2 puffs into the lungs every 6 (six) hours as needed. For wheezing or shortness of breath      . allopurinol (ZYLOPRIM) 100 MG tablet Take 1 tablet by mouth daily.      Marland Kitchen aspirin 81 MG tablet Take 81 mg by mouth daily.      . bisoprolol (ZEBETA) 10 MG tablet Take 1 tablet (10 mg total) by mouth daily.  30 tablet  6  . cefdinir (OMNICEF) 300 MG capsule Take 1 capsule (300 mg total) by mouth 2 (two) times daily.  20 capsule  0  . colchicine 0.6 MG tablet Take 0.6 mg by mouth daily as needed. Gout flare ups      . digoxin (LANOXIN) 0.25 MG tablet Take 0.5 tablets (125 mcg total) by mouth daily.  30 tablet  6  . fluticasone (FLONASE) 50 MCG/ACT nasal spray Place 2 sprays into the nose as needed.       . Fluticasone-Salmeterol (ADVAIR DISKUS) 250-50 MCG/DOSE AEPB Inhale 1 puff into the lungs every 12 (twelve) hours as needed. For wheezing or shortness of breath      . glipizide-metformin (METAGLIP) 2.5-250 MG per tablet Take 1 tablet by  mouth 2 (two) times daily before a meal.        . guaiFENesin (MUCINEX) 600 MG 12 hr tablet Take 2 tablets (1,200 mg total) by mouth 2 (two) times daily.      Marland Kitchen HYDROcodone-acetaminophen (NORCO/VICODIN) 5-325 MG per tablet Take 2 tablets by mouth every 4 (four) hours as needed for pain.  6 tablet  0  . metolazone (ZAROXOLYN) 2.5 MG tablet Take 1 tablet (2.5 mg total) by mouth as directed.  5 tablet  3  . montelukast (SINGULAIR) 10 MG tablet Take 10 mg by mouth daily as needed. For shortness of breath or wheezing      . ramipril (ALTACE) 10 MG tablet Take 1/2 tablet (5 mg total) by mouth daily.      Marland Kitchen saccharomyces boulardii (FLORASTOR) 250 MG capsule Take 1 capsule (250 mg total) by mouth 2 (two) times daily.  30 capsule  0  . simvastatin (ZOCOR) 40 MG tablet Take 40 mg by mouth at bedtime.        Marland Kitchen spironolactone (ALDACTONE) 25 MG tablet Restart 0.5 tablets (12.5 mg  total) by mouth daily on June 10.      . torsemide (DEMADEX) 20 MG tablet Take 40 mg by mouth 2 (two) times daily.       . traMADol (ULTRAM) 50 MG tablet Take 1 tablet (50 mg total) by mouth every 8 (eight) hours as needed.  20 tablet  0   No current facility-administered medications for this encounter.     Filed Vitals:   07/27/13 1006  BP: 102/64  Pulse: 87  Weight: 352 lb (159.666 kg)  SpO2: 95%    PHYSICAL EXAM: General:  Well appearing. No resp difficulty; obesee HEENT: normal Neck: supple. JVP hard to see but does not appear elevated. Carotids 2+ bilaterally; no bruits. No lymphadenopathy or thryomegaly appreciated. Cor: PMI nonpalpable. Regular rate & rhythm. No rubs, gallops or 2/6 TR murmur. Lungs: clear Abdomen: obese, soft, nontender, nondistended.  Good bowel sounds. Extremities: no cyanosis, clubbing, rash, trace edema Neuro: alert & orientedx3, cranial nerves grossly intact. Moves all 4 extremities w/o difficulty. Affect pleasant.  ASSESSMENT & PLAN:   1) Chronic systolic HF, NICM s/p ICD, EF 16% (07/2013) - NYHA II symptoms. Volume status stable will continue torsemide 40 mg BID. Have discussed with the patient about weighing daily and to use sliding scale diuretics.  - Echo reviewed today with patient and EF is improved to 40%. Reinforced with patient the need to continue to take medications as prescribed, wear CPAP and manage his weight. - Bisoprolol at goal dose 10 mg daily. Continue digoxin, ramipril, and spiro at current doses. - Reinforced the need and importance of daily weights, a low sodium diet, and fluid restriction (less than 2 L a day). Instructed to call the HF clinic if weight increases more than 3 lbs overnight or 5 lbs in a week.  -BMET today 2) HTN - controlled on BB, ACE-I, and spiro 3) OSA  - Continue to wear CPAP nightly 4) VTach, s/p ICD 2009 - no shocks from ICD 5) Obesity - Discussed with patient for a long time about diet and trying to  exercise more. He has a stationary bike now and is going to start riding. Told him to look into Atkins diet.   Follow up 2 months  Ulla Potash B NP-C 3:15 PM  Patient seen and examined with Ulla Potash, NP. We discussed all aspects of the encounter. I agree with the assessment  and plan as stated above. Echo and Optivol tracings reviewed with him personally. He is doing well from HF perspective. EF now up to 40%. Optivol looks good currently did have 1 threshold crossing recently. Long talk about need to watch his diet more closely and lose weight. Reinforced need for daily weights and reviewed use of sliding scale diuretics.   Krystalle Pilkington,MD 10:00 PM

## 2013-07-27 ENCOUNTER — Ambulatory Visit (INDEPENDENT_AMBULATORY_CARE_PROVIDER_SITE_OTHER)
Admission: RE | Admit: 2013-07-27 | Discharge: 2013-07-27 | Disposition: A | Discharge status: Home / Self Care | Payer: Medicare Other | Source: Ambulatory Visit | Attending: Internal Medicine | Admitting: Internal Medicine

## 2013-07-27 ENCOUNTER — Ambulatory Visit (HOSPITAL_COMMUNITY)
Admission: RE | Admit: 2013-07-27 | Discharge: 2013-07-27 | Disposition: A | Discharge status: Home / Self Care | Payer: Medicare Other | Source: Ambulatory Visit | Attending: Internal Medicine | Admitting: Internal Medicine

## 2013-07-27 VITALS — BP 102/64 | HR 87 | Wt 352.0 lb

## 2013-07-27 DIAGNOSIS — I059 Rheumatic mitral valve disease, unspecified: Secondary | ICD-10-CM | POA: Insufficient documentation

## 2013-07-27 DIAGNOSIS — I5022 Chronic systolic (congestive) heart failure: Secondary | ICD-10-CM

## 2013-07-27 DIAGNOSIS — I509 Heart failure, unspecified: Secondary | ICD-10-CM | POA: Insufficient documentation

## 2013-07-27 DIAGNOSIS — I379 Nonrheumatic pulmonary valve disorder, unspecified: Secondary | ICD-10-CM | POA: Insufficient documentation

## 2013-07-27 DIAGNOSIS — I10 Essential (primary) hypertension: Secondary | ICD-10-CM | POA: Insufficient documentation

## 2013-07-27 DIAGNOSIS — I5020 Unspecified systolic (congestive) heart failure: Secondary | ICD-10-CM | POA: Insufficient documentation

## 2013-07-27 DIAGNOSIS — E119 Type 2 diabetes mellitus without complications: Secondary | ICD-10-CM | POA: Insufficient documentation

## 2013-07-27 DIAGNOSIS — I079 Rheumatic tricuspid valve disease, unspecified: Secondary | ICD-10-CM | POA: Insufficient documentation

## 2013-07-27 DIAGNOSIS — D649 Anemia, unspecified: Secondary | ICD-10-CM | POA: Insufficient documentation

## 2013-07-27 DIAGNOSIS — F1010 Alcohol abuse, uncomplicated: Secondary | ICD-10-CM | POA: Insufficient documentation

## 2013-07-27 LAB — BASIC METABOLIC PANEL
Calcium: 10.3 mg/dL (ref 8.4–10.5)
GFR calc Af Amer: 55 mL/min — ABNORMAL LOW (ref >90)
GFR calc non Af Amer: 48 mL/min — ABNORMAL LOW (ref >90)
Glucose, Bld: 132 mg/dL — ABNORMAL HIGH (ref 70–99)
Potassium: 4.7 mEq/L (ref 3.5–5.1)
Sodium: 138 mEq/L (ref 135–145)

## 2013-07-27 LAB — DIGOXIN LEVEL: Digoxin Level: 0.9 ng/mL (ref 0.8–2.0)

## 2013-07-27 NOTE — Patient Instructions (Signed)
Will call with lab results.  Continue to take medications as prescribed.  Weigh daily and if your weight is up 3 lbs in 1 day or 5 lbs in a week take an extra 20 mg of torsemide.   Try to start exercising and riding your stationary bicycle.  Look up the Atkins diet  Follow up 2 months.  Do the following things EVERYDAY: 1) Weigh yourself in the morning before breakfast. Write it down and keep it in a log. 2) Take your medicines as prescribed 3) Eat low salt foods-Limit salt (sodium) to 2000 mg per day.  4) Stay as active as you can everyday 5) Limit all fluids for the day to less than 2 liters 6)

## 2013-07-29 ENCOUNTER — Telehealth (HOSPITAL_COMMUNITY): Payer: Self-pay | Admitting: Anesthesiology

## 2013-07-29 MED ORDER — DIGOXIN 250 MCG PO TABS: 125.0000 ug | ORAL_TABLET | ORAL | Status: DC

## 2013-07-29 NOTE — Telephone Encounter (Signed)
Labs reviewed 07/27/13. Cr elevated 1.02>1.61. Will have patient hold today's dose of torsemide. Dig level 0.9 will cut back to 125 mcg QOD. Patient aware.

## 2013-08-03 ENCOUNTER — Ambulatory Visit (INDEPENDENT_AMBULATORY_CARE_PROVIDER_SITE_OTHER): Payer: Medicare Other | Admitting: *Deleted

## 2013-08-03 DIAGNOSIS — I472 Ventricular tachycardia: Secondary | ICD-10-CM

## 2013-08-03 DIAGNOSIS — I5022 Chronic systolic (congestive) heart failure: Secondary | ICD-10-CM

## 2013-08-03 DIAGNOSIS — I509 Heart failure, unspecified: Secondary | ICD-10-CM

## 2013-08-03 LAB — ICD DEVICE OBSERVATION
BATTERY VOLTAGE: 3 V
BRDY-0002RV: 40 {beats}/min
HV IMPEDENCE: 100 Ohm
PACEART VT: 0
TOT-0006: 20090901000000
TZAT-0001SLOWVT: 1
TZAT-0002FASTVT: NEGATIVE
TZAT-0002SLOWVT: NEGATIVE
TZAT-0012FASTVT: 200 ms
TZON-0004VSLOWVT: 32
TZON-0005SLOWVT: 12
TZST-0001FASTVT: 3
TZST-0001FASTVT: 5
TZST-0001SLOWVT: 3
TZST-0001SLOWVT: 4
TZST-0002FASTVT: NEGATIVE
TZST-0002FASTVT: NEGATIVE
TZST-0002SLOWVT: NEGATIVE
TZST-0002SLOWVT: NEGATIVE
TZST-0002SLOWVT: NEGATIVE
TZST-0002SLOWVT: NEGATIVE
VF: 0

## 2013-08-03 NOTE — Progress Notes (Signed)
ICD check in clinic. Normal device function. Thresholds and sensing consistent with previous device measurements. Impedance trends stable over time. No evidence of any ventricular arrhythmias.  Histogram distribution appropriate for patient and level of activity.  Device programmed at appropriate safety margins. Device programmed to optimize intrinsic conduction. Optivol and thoracic impedance abnormal 9/23-9/28.   Pt enrolled in remote follow-up. Plan to check device every 3 months remotely and in office annually. Patient education completed including shock plan. Alert tones/vibration demonstrated for patient.  ROV in January with Dr. [name].

## 2013-08-09 ENCOUNTER — Ambulatory Visit (HOSPITAL_COMMUNITY)
Admission: RE | Admit: 2013-08-09 | Discharge: 2013-08-09 | Disposition: A | Discharge status: Home / Self Care | Payer: Medicare Other | Source: Ambulatory Visit | Attending: Internal Medicine | Admitting: Internal Medicine

## 2013-08-09 DIAGNOSIS — I5022 Chronic systolic (congestive) heart failure: Secondary | ICD-10-CM

## 2013-08-09 NOTE — Progress Notes (Addendum)
Heart Failure Diagnostics Follow Up Form - [employer]    Fluid Index        ___   Fluid index trending with baseline       _X__   Fluid index trending up but below threshold       ___   Fluid index above threshold       ___   More than 3 threshold crossings this year        Current diuretic dose: .        Torsemide 40 mg BID                                                                                          Baseline weight: .                                      Current Weight: .                                     Patient Activity        Current activity level: .       3 hours daily                                                   Baseline activity level: .      3 hours daily                                                    AT/AF Burden        ___  Increase in AT/AF burden       ___  New onset of AT/AF       _X__  No AT/AF         ICD Therapies        ___ 1 or more ICD shocks    Interventions       _X__  Adjust medications: .  Pt states he does not feel he has extra volume, he does not weigh daily, he reports he has been drinking a lot of smoothes, we discussed fluid restrictions and keeping intake below 2 L daily, he will work on this, he will take an extra 40 mg of Torsemide today, if develops edema or SOB will call back      ___  Labs: .  ___  Need for follow up appointment: .                                                                                _X__  HF diagnostics follow up: .    08/29/13                                                                                      ___  Review with EP: Marland Kitchen                                                                                                         Patient notified: Date: .    08/09/13                            By: .  Juliann Pares     Phone   .       Letter

## 2013-08-11 ENCOUNTER — Encounter: Payer: Self-pay | Admitting: Internal Medicine

## 2013-08-19 ENCOUNTER — Encounter: Payer: Self-pay | Admitting: Internal Medicine

## 2013-08-30 ENCOUNTER — Ambulatory Visit (HOSPITAL_COMMUNITY)
Admission: RE | Admit: 2013-08-30 | Discharge: 2013-08-30 | Disposition: A | Discharge status: Home / Self Care | Payer: Medicare Other | Source: Ambulatory Visit | Attending: Internal Medicine | Admitting: Internal Medicine

## 2013-08-30 DIAGNOSIS — I509 Heart failure, unspecified: Secondary | ICD-10-CM

## 2013-08-30 MED ORDER — METOLAZONE 2.5 MG PO TABS: 2.5000 mg | ORAL_TABLET | ORAL | Status: DC

## 2013-08-30 NOTE — Progress Notes (Signed)
Heart Failure Diagnostics Follow Up Form - [employer]    Fluid Index        ___   Fluid index trending with baseline       ___   Fluid index trending up but below threshold       _X__   Fluid index above threshold       ___   More than 3 threshold crossings this year        Current diuretic dose: .     Torsemide 40 mg BID, Spiro 12.5 mg daily                                                                                             Baseline weight: .                                      Current Weight: .                                     Patient Activity        Current activity level: .  3 hours daily                                                        Baseline activity level: .    3 hours daily                                                      AT/AF Burden        ___  Increase in AT/AF burden       ___  New onset of AT/AF       _X__  No AT/AF         ICD Therapies        ___ 1 or more ICD shocks    Interventions       _X__  Adjust medications: . Per Dr Gala Romney have pt start Metolazone 2.5 mg every Tue and Sat, pt aware and verbalized understanding, pt denies SOB or edema, we again discussed fluid restriction and sodium restrictions  _X__  Labs: .    12/3 at [facility] on [address]                                                                                                                      ___  Need for follow up appointment: .                                                                                _X__  HF diagnostics follow up: .    09/12/13                                                                                      ___  Review with EP: Marland Kitchen                                                                                                         Patient notified: Date: .   08/30/13                             By: .  Juliann Pares     Phone   .       Letter

## 2013-08-31 ENCOUNTER — Other Ambulatory Visit (HOSPITAL_COMMUNITY): Payer: Self-pay | Admitting: *Deleted

## 2013-08-31 MED ORDER — DIGOXIN 250 MCG PO TABS: 125.0000 ug | ORAL_TABLET | ORAL | Status: DC

## 2013-09-07 ENCOUNTER — Encounter: Payer: Self-pay | Admitting: Internal Medicine

## 2013-09-13 ENCOUNTER — Ambulatory Visit (HOSPITAL_COMMUNITY)
Admission: RE | Admit: 2013-09-13 | Discharge: 2013-09-13 | Disposition: A | Discharge status: Home / Self Care | Payer: Medicare Other | Source: Ambulatory Visit | Attending: Internal Medicine | Admitting: Internal Medicine

## 2013-09-13 DIAGNOSIS — I5022 Chronic systolic (congestive) heart failure: Secondary | ICD-10-CM

## 2013-09-13 DIAGNOSIS — I509 Heart failure, unspecified: Secondary | ICD-10-CM

## 2013-09-14 ENCOUNTER — Encounter (HOSPITAL_COMMUNITY): Payer: Self-pay | Admitting: *Deleted

## 2013-09-14 NOTE — Progress Notes (Signed)
Heart Failure Diagnostics Follow Up Form - [employer]    Fluid Index        _X__   Fluid index trending with baseline       ___   Fluid index trending up but below threshold       ___   Fluid index above threshold       ___   More than 3 threshold crossings this year        Current diuretic dose: .   Torsemide 40 mg bid and spiro 12.5 mg daily                                                                                               Baseline weight: .                                      Current Weight: .                                     Patient Activity        Current activity level: .        3 hours                                                  Baseline activity level: .       3-4 hours                                                   AT/AF Burden        ___  Increase in AT/AF burden       ___  New onset of AT/AF       _X__  No AT/AF         ICD Therapies        ___ 1 or more ICD shocks    Interventions       ___  Adjust medications: .                                                                                                   ___  Labs: .  ___  Need for follow up appointment: .   Pt sch for f/u appt 12/19                                                                           _X__  HF diagnostics follow up: .       12/29                                                                                   ___  Review with EP: Marland Kitchen                                                                                                         Patient notified: Date: . 09/14/13                              By: .       Phone   .   X    Letter

## 2013-09-19 ENCOUNTER — Encounter: Payer: Self-pay | Admitting: Internal Medicine

## 2013-09-23 ENCOUNTER — Encounter (HOSPITAL_COMMUNITY): Payer: Medicare Other

## 2013-10-04 ENCOUNTER — Ambulatory Visit (HOSPITAL_COMMUNITY)
Admission: RE | Admit: 2013-10-04 | Discharge: 2013-10-04 | Disposition: A | Discharge status: Home / Self Care | Payer: Medicare Other | Source: Ambulatory Visit | Attending: Internal Medicine | Admitting: Internal Medicine

## 2013-10-04 DIAGNOSIS — I509 Heart failure, unspecified: Secondary | ICD-10-CM

## 2013-10-04 DIAGNOSIS — I5022 Chronic systolic (congestive) heart failure: Secondary | ICD-10-CM

## 2013-10-04 NOTE — Progress Notes (Signed)
Heart Failure Diagnostics Follow Up Form - [employer]    Fluid Index        ___   Fluid index trending with baseline       _X__   Fluid index trending up but below threshold       ___   Fluid index above threshold       ___   More than 3 threshold crossings this year        Current diuretic dose: .    Torsemide 40 mg BID, however pt does report missing several evening doses lately, advised on importance of taking                                                                                              Baseline weight: .                                      Current Weight: .                                     Patient Activity        Current activity level: .    3 hours                                                      Baseline activity level: .   3 hours                                                       AT/AF Burden        ___  Increase in AT/AF burden       ___  New onset of AT/AF       _X__  No AT/AF         ICD Therapies        ___ 1 or more ICD shocks    Interventions       _X__  Adjust medications: . Will take extra doses of Torsemide, again reviewed importance of med compliance and fluid and NA restrictions, pt is scheduled for f/u appt on Mon 1/5  ___  Labs: .                                                                                                                          ___  Need for follow up appointment: .                                                                                _X__  HF diagnostics follow up: .      2/2                                                                                    ___  Review with EP: Marland Kitchen                                                                                                         Patient notified: Date: .     10/04/13                           By: .  Juliann Pares     Phone   .       Letter

## 2013-10-07 ENCOUNTER — Encounter: Payer: Self-pay | Admitting: Internal Medicine

## 2013-10-10 ENCOUNTER — Ambulatory Visit (HOSPITAL_COMMUNITY)
Admission: RE | Admit: 2013-10-10 | Discharge: 2013-10-10 | Disposition: A | Discharge status: Home / Self Care | Payer: Medicare Other | Source: Ambulatory Visit | Attending: Internal Medicine | Admitting: Internal Medicine

## 2013-10-10 VITALS — BP 106/64 | HR 85 | Wt 353.5 lb

## 2013-10-10 DIAGNOSIS — I5022 Chronic systolic (congestive) heart failure: Secondary | ICD-10-CM | POA: Insufficient documentation

## 2013-10-10 DIAGNOSIS — I509 Heart failure, unspecified: Secondary | ICD-10-CM

## 2013-10-10 DIAGNOSIS — I10 Essential (primary) hypertension: Secondary | ICD-10-CM | POA: Insufficient documentation

## 2013-10-10 DIAGNOSIS — G4733 Obstructive sleep apnea (adult) (pediatric): Secondary | ICD-10-CM

## 2013-10-10 LAB — BASIC METABOLIC PANEL
BUN: 29 mg/dL — AB (ref 6–23)
CALCIUM: 9.5 mg/dL (ref 8.4–10.5)
CO2: 29 mEq/L (ref 19–32)
CREATININE: 1.4 mg/dL — AB (ref 0.50–1.35)
Chloride: 93 mEq/L — ABNORMAL LOW (ref 96–112)
GFR calc Af Amer: 65 mL/min — ABNORMAL LOW (ref >90)
GFR calc non Af Amer: 56 mL/min — ABNORMAL LOW (ref >90)
GLUCOSE: 247 mg/dL — AB (ref 70–99)
Potassium: 3.7 mEq/L (ref 3.7–5.3)
SODIUM: 137 meq/L (ref 137–147)

## 2013-10-10 MED ORDER — DIGOXIN 250 MCG PO TABS: 125.0000 ug | ORAL_TABLET | ORAL | Status: DC

## 2013-10-10 NOTE — Progress Notes (Signed)
Patient ID: Gary Hutchinson, male   DOB: Apr 12, 1961, 53 y.o.   MRN: [id number] PCP: Creola CornJohn Russo EP: Hillis RangeJames Allred  HPI:  [name] is a 53 year old male with a history of chronic systolic heart failure, EF 25-30% (04/2012), NICM, HTN, DM II,  OSA on CPAP, history of ventricular tachycardia status post AICD placement in 2009, and morbid obesity.    04/16/12  [facility] Admitted and treated for volume overload with subsuquent re-admit the next week for volume overload. Sluggish response to IV Lasix and placed on Milrinone. D/C weight 151 kg.  He returns for follow up. Denies SOB/PND/Orthopnea/shocks. Gary Hutchinson. Says he has missed a few doses of the evening torsemide. He has has had an extra dose of torsemide based on optivol 10/04/13.  He has not  been exercising in December. Weight at home 339 pounds. He has not been taking metolazone. Drinking > 2 liters of fluid per day. Compliant with medications but did not take diuretics this am.  Uses CPAP nightly.   Spironolactone continues at 12.5 mg daily due to hyperkalemia noted when increased to 25 mg daily.   Optivol: Below threshold but trending up. Activity ~2 hours per day. No afib of VT.  Echo 05/03/12: EF 25-30%.  Grade 1 diastolic dysfunction.  Mild MR.  Mod dilated LA.          07/27/13: EF 40%, diff HK, MR mild, LA mild/mod dilated   Labs  03/08/13 K 3.8 Creatinine 1.02  07/27/13 K 4.7 Creatinine 1.61 Dig level 0.9 ---> dig cut back to 0.125 mg every other day 10/10/13 K 3.7 Creatinine 1.4    ROS: All systems negative except as listed in HPI, PMH and Problem List.  Past Medical History  Diagnosis Date  . Cardiomyopathy     nonischemic  . Ventricular tachycardia     s/p MDT ICD implant  . Alcohol abuse     now quit  . Pulmonary hypertension   . Diverticulosis of colon   . Hemorrhoids, internal   . Cholecystitis   . Dyspepsia   . Morbid obesity   . Obstructive sleep apnea   . Anemia     iron defi  . Hypertension   . Gout   . Diabetes mellitus   .  Chronic systolic congestive heart failure   . Asthma     Current Outpatient Prescriptions  Medication Sig Dispense Refill  . acetaminophen (TYLENOL) 325 MG tablet Take 2 tablets (650 mg total) by mouth every 6 (six) hours as needed.      Marland Kitchen. albuterol (PROAIR HFA) 108 (90 BASE) MCG/ACT inhaler Inhale 2 puffs into the lungs every 6 (six) hours as needed. For wheezing or shortness of breath      . allopurinol (ZYLOPRIM) 100 MG tablet Take 1 tablet by mouth daily.      Marland Kitchen. aspirin 81 MG tablet Take 81 mg by mouth daily.      . bisoprolol (ZEBETA) 10 MG tablet Take 1 tablet (10 mg total) by mouth daily.  30 tablet  6  . colchicine 0.6 MG tablet Take 0.6 mg by mouth daily as needed. Gout flare ups      . digoxin (LANOXIN) 0.25 MG tablet Take 125 mcg by mouth daily.      . fluticasone (FLONASE) 50 MCG/ACT nasal spray Place 2 sprays into the nose as needed.       . Fluticasone-Salmeterol (ADVAIR DISKUS) 250-50 MCG/DOSE AEPB Inhale 1 puff into the lungs every 12 (twelve) hours as needed.  For wheezing or shortness of breath      . glipizide-metformin (METAGLIP) 2.5-250 MG per tablet Take 1 tablet by mouth 2 (two) times daily before a meal.        . metolazone (ZAROXOLYN) 2.5 MG tablet Take 2.5 mg by mouth as needed. Every Tue and Sat      . montelukast (SINGULAIR) 10 MG tablet Take 10 mg by mouth daily as needed. For shortness of breath or wheezing      . ramipril (ALTACE) 10 MG tablet Take 10 mg by mouth daily.      . simvastatin (ZOCOR) 40 MG tablet Take 40 mg by mouth at bedtime.        Marland Kitchen spironolactone (ALDACTONE) 25 MG tablet Restart 0.5 tablets (12.5 mg total) by mouth daily on June 10.      . torsemide (DEMADEX) 20 MG tablet Take 40 mg by mouth 2 (two) times daily.       . traMADol (ULTRAM) 50 MG tablet Take 1 tablet (50 mg total) by mouth every 8 (eight) hours as needed.  20 tablet  0   No current facility-administered medications for this encounter.     Filed Vitals:   10/10/13 0944  BP:  106/64  Pulse: 85  Weight: 353 lb 8 oz (160.347 kg)  SpO2: 97%    PHYSICAL EXAM: General:  Well appearing. No resp difficulty; obesee HEENT: normal Neck: supple. JVP hard to see but appears mildly elevated. Carotids 2+ bilaterally; no bruits. No lymphadenopathy or thryomegaly appreciated. Cor: PMI nonpalpable. Regular rate & rhythm. No rubs, gallops or 2/6 TR murmur. Lungs: clear Abdomen: obese, soft, nontender, nondistended.  Good bowel sounds. Extremities: no cyanosis, clubbing, rash, trace edema Neuro: alert & orientedx3, cranial nerves grossly intact. Moves all 4 extremities w/o difficulty. Affect pleasant.  ASSESSMENT & PLAN:   1) Chronic systolic HF, NICM s/p ICD [employer], EF 40% (07/2013) - NYHA II symptoms. Volume status mildly elevated. This was confirmed with Optivol trending up to threshold. Continue torsemide 40 mg BID and he is instructed to take 2.5 mg metolazone today. Continue spironolactone 12.5 mg daily.  Unable to increase spiro due to hyperkalemia.  - Bisoprolol at goal dose 10 mg daily. Cut back digoxin to 0.125 mg every other day. Dig level was 0.9 on 07/2013. Continue  Ramipril 10 mg daily.   - Reinforced the need and importance of daily weights, a low sodium diet, and fluid restriction (less than 2 L a day). Instructed to call the HF clinic if weight increases more than 3 lbs overnight or 5 lbs in a week.  -Check BMET today--> K 3.7 Creatinine 1.4  2) HTN - controlled on BB, ACE-I, and spiro 3) OSA  - Continue to wear CPAP nightly 4) VTach, s/p ICD 2009 - no shocks from ICD 5) Obesity-  - Reinforced the need to lose weight and cut back on his portions. Encouraged to start exercising.  Follow up 2 months. Will continue monthly Optivol checks.    Tifany Hirsch NP-C 10:02 AM

## 2013-10-10 NOTE — Patient Instructions (Signed)
Follow up in 2 months  Take 0.125 mg digoxin every other day  Do the following things EVERYDAY: 1) Weigh yourself in the morning before breakfast. Write it down and keep it in a log. 2) Take your medicines as prescribed 3) Eat low salt foods-Limit salt (sodium) to 2000 mg per day.  4) Stay as active as you can everyday 5) Limit all fluids for the day to less than 2 liters

## 2013-11-03 ENCOUNTER — Encounter: Payer: Medicare Other | Admitting: Internal Medicine

## 2013-11-07 ENCOUNTER — Encounter: Payer: Self-pay | Admitting: Internal Medicine

## 2013-11-08 ENCOUNTER — Ambulatory Visit (HOSPITAL_COMMUNITY)
Admission: RE | Admit: 2013-11-08 | Discharge: 2013-11-08 | Disposition: A | Discharge status: Home / Self Care | Payer: Medicare Other | Source: Ambulatory Visit | Attending: Internal Medicine | Admitting: Internal Medicine

## 2013-11-08 DIAGNOSIS — I5022 Chronic systolic (congestive) heart failure: Secondary | ICD-10-CM

## 2013-11-09 ENCOUNTER — Other Ambulatory Visit (HOSPITAL_COMMUNITY): Payer: Self-pay | Admitting: Adult Health

## 2013-11-10 NOTE — Progress Notes (Signed)
Heart Failure Diagnostics Follow Up Form - [employer]    Fluid Index        ___   Fluid index trending with baseline       ___   Fluid index trending up but below threshold       _X__   Fluid index above threshold       ___   More than 3 threshold crossings this year        Current diuretic dose: . Torsemide 40 mg BID, Metolazone 2.5 mg every Tue and Sat, Sprio 12.5 mg daily                                                                                       Baseline weight: .                                      Current Weight: .  PT DOES NOT WEIGH HIMSELF                                   Patient Activity        Current activity level: .      4 hours daily                                                    Baseline activity level: .     3 hours daily                                                     AT/AF Burden        ___  Increase in AT/AF burden       ___  New onset of AT/AF       _X__  No AT/AF         ICD Therapies        ___ 1 or more ICD shocks    Interventions       _X__  Adjust medications: . After speaking with pt he states he has not been taking Metolazone and he occasionally forgets to take his evening dose of torsemide, he denies edema or SOB, he doesn't weigh himself daily.  We discussed importance of evening Torsemide and discussed pt taking it around 3 he is agreeable to try this, pt will also take Metolazone today and then start taking every Tue and Sat as prescribed it dev edema or SOB he is advised to call back will go ahead and sch recheck optivol in 2 week 2/16  ___  Labs: .                                                                                                                          ___  Need for follow up appointment: .                                                                                _X__  HF diagnostics follow up: .     2/16                                                                                      ___  Review with EP: Marland Kitchen.                                                                                                         Patient notified: Date: .   11/10/13                             By: . Juliann ParesX      Phone   .       Letter

## 2013-11-11 ENCOUNTER — Ambulatory Visit: Payer: Medicare Other | Admitting: Pulmonary Disease

## 2013-11-14 ENCOUNTER — Ambulatory Visit: Payer: Medicare Other | Admitting: Pulmonary Disease

## 2013-11-21 ENCOUNTER — Telehealth (HOSPITAL_COMMUNITY): Payer: Medicare Other

## 2013-11-22 ENCOUNTER — Encounter: Payer: Medicare Other | Admitting: Cardiology

## 2013-11-24 ENCOUNTER — Encounter: Payer: Self-pay | Admitting: Internal Medicine

## 2013-12-05 ENCOUNTER — Ambulatory Visit: Payer: Medicare Other | Admitting: Pulmonary Disease

## 2013-12-07 ENCOUNTER — Ambulatory Visit (INDEPENDENT_AMBULATORY_CARE_PROVIDER_SITE_OTHER): Payer: Medicare Other | Admitting: Pulmonary Disease

## 2013-12-07 ENCOUNTER — Encounter: Payer: Self-pay | Admitting: Pulmonary Disease

## 2013-12-07 VITALS — BP 104/66 | HR 90 | Temp 98.0°F | Ht 66.0 in | Wt 348.4 lb

## 2013-12-07 DIAGNOSIS — G4733 Obstructive sleep apnea (adult) (pediatric): Secondary | ICD-10-CM

## 2013-12-07 NOTE — Progress Notes (Signed)
   Subjective:    Patient ID: Gary Hutchinson, male    DOB: 28-Nov-1960, 53 y.o.   MRN: [id number]  HPI The patient comes in today for followup of his known obstructive sleep apnea.  He is wearing bilevel compliantly, and is having no issues with his pressure or mask fit. He tells me that his machine is approximately 53 years old, but it's in good working order. The patient feels that he is sleeping well with the device, and is satisfied with his daytime alertness. His weight is unchanged since last visit.   Review of Systems  Constitutional: Negative for fever and unexpected weight change.  HENT: Negative for congestion, dental problem, ear pain, nosebleeds, postnasal drip, rhinorrhea, sinus pressure, sneezing, sore throat and trouble swallowing.   Eyes: Negative for redness and itching.  Respiratory: Negative for cough, chest tightness, shortness of breath and wheezing.   Cardiovascular: Negative for palpitations and leg swelling.  Gastrointestinal: Negative for nausea and vomiting.  Genitourinary: Negative for dysuria.  Musculoskeletal: Negative for joint swelling.  Skin: Negative for rash.  Neurological: Negative for headaches.  Hematological: Does not bruise/bleed easily.  Psychiatric/Behavioral: Negative for dysphoric mood. The patient is not nervous/anxious.        Objective:   Physical Exam Obese male in no acute distress Nose without purulence or discharge noted No skin breakdown or pressure necrosis from the CPAP mask Neck without lymphadenopathy or thyromegaly Lower extremities with mild edema, no cyanosis Alert and oriented, moves all 4 extremities.       Assessment & Plan:

## 2013-12-07 NOTE — Patient Instructions (Signed)
Continue with bipap, and keep up with mask changes and supplies. Work aggressively on weight loss followup with me again in one year, but call if having problems.

## 2013-12-07 NOTE — Assessment & Plan Note (Signed)
The patient appears to be doing well on bilevel at this time, and I have stressed to him the importance of aggressive weight loss and keeping up with his mask cushion changes and supplies. He is to followup with me in one year if doing well.

## 2013-12-13 ENCOUNTER — Ambulatory Visit (HOSPITAL_COMMUNITY)
Admission: RE | Admit: 2013-12-13 | Discharge: 2013-12-13 | Disposition: A | Discharge status: Home / Self Care | Payer: Medicare Other | Source: Ambulatory Visit | Attending: Internal Medicine | Admitting: Internal Medicine

## 2013-12-13 DIAGNOSIS — I5022 Chronic systolic (congestive) heart failure: Secondary | ICD-10-CM

## 2013-12-15 NOTE — Progress Notes (Signed)
Heart Failure Diagnostics Follow Up Form - [employer]    Fluid Index        _X__   Fluid index trending with baseline       ___   Fluid index trending up but below threshold       ___   Fluid index above threshold       ___   More than 3 threshold crossings this year        Current diuretic dose: .     Lasix 40 mg BID, Met. 2.5 mg Tue and Sat                                                                                            Baseline weight: .                                      Current Weight: .                                     Patient Activity        Current activity level: .     About 2 & 1/2 hours daily                                                     Baseline activity level: .   2-3 hours                                                       AT/AF Burden        ___  Increase in AT/AF burden       ___  New onset of AT/AF       _X__  No AT/AF         ICD Therapies        ___ 1 or more ICD shocks    Interventions       _X__  Adjust medications: . Advised to continue to take meds as prescribed                                                                                                  ___  Labs: .  ___  Need for follow up appointment: .                                                                                _X__  HF diagnostics follow up: .  01/15/14                                                                                        ___  Review with EP: Marland Kitchen                                                                                                         Patient notified: Date: .  12/15/13                              By: .   Rhunette Croft    Phone   .       Letter

## 2013-12-27 ENCOUNTER — Encounter: Payer: Medicare Other | Admitting: Cardiology

## 2013-12-28 ENCOUNTER — Encounter: Payer: Self-pay | Admitting: Internal Medicine

## 2014-01-17 ENCOUNTER — Ambulatory Visit (INDEPENDENT_AMBULATORY_CARE_PROVIDER_SITE_OTHER): Payer: Medicare Other | Admitting: Cardiology

## 2014-01-17 ENCOUNTER — Encounter: Payer: Self-pay | Admitting: Cardiology

## 2014-01-17 ENCOUNTER — Telehealth (HOSPITAL_COMMUNITY): Payer: Medicare Other

## 2014-01-17 ENCOUNTER — Encounter: Payer: Self-pay | Admitting: Internal Medicine

## 2014-01-17 VITALS — BP 124/80 | HR 96 | Ht 65.0 in | Wt 356.0 lb

## 2014-01-17 DIAGNOSIS — I4729 Other ventricular tachycardia: Secondary | ICD-10-CM

## 2014-01-17 DIAGNOSIS — I428 Other cardiomyopathies: Secondary | ICD-10-CM

## 2014-01-17 DIAGNOSIS — Z95810 Presence of automatic (implantable) cardiac defibrillator: Secondary | ICD-10-CM

## 2014-01-17 DIAGNOSIS — I472 Ventricular tachycardia: Secondary | ICD-10-CM

## 2014-01-17 DIAGNOSIS — I509 Heart failure, unspecified: Secondary | ICD-10-CM

## 2014-01-17 DIAGNOSIS — I5022 Chronic systolic (congestive) heart failure: Secondary | ICD-10-CM

## 2014-01-17 LAB — MDC_IDC_ENUM_SESS_TYPE_INCLINIC
Battery Voltage: 2.96 V
Brady Statistic RV Percent Paced: 0.01 %
HighPow Impedance: 88 Ohm
Lead Channel Impedance Value: 360 Ohm
Lead Channel Pacing Threshold Amplitude: 1 V
Lead Channel Pacing Threshold Pulse Width: 0.4 ms
Lead Channel Sensing Intrinsic Amplitude: 8.2 mV
Lead Channel Setting Pacing Amplitude: 2.5 V
MDC IDC SESS DTM: 20150414140121
MDC IDC SET LEADCHNL RV PACING PULSEWIDTH: 0.4 ms
MDC IDC SET LEADCHNL RV SENSING SENSITIVITY: 0.3 mV
MDC IDC SET ZONE DETECTION INTERVAL: 340 ms
Zone Setting Detection Interval: 300 ms
Zone Setting Detection Interval: 400 ms

## 2014-01-17 NOTE — Patient Instructions (Addendum)
Remote monitoring is used to monitor your Pacemaker of ICD from home. This monitoring reduces the number of office visits required to check your device to one time per year. It allows [place] to keep an eye on the functioning of your device to ensure it is working properly. You are scheduled for a device check from home on  You may send your transmission at any time that day. If you have a wireless device, the transmission will be sent automatically. After your physician reviews your transmission, you will receive a postcard with your next transmission date.  Your physician wants you to follow-up in: 1 year with Dr. Jacquiline Doe will receive a reminder letter in the mail two months in advance. If you don't receive a letter, please call our office to schedule the follow-up appointment.  Your physician recommends that you continue on your current medications as directed. Please refer to the Current Medication list given to you today.

## 2014-01-17 NOTE — Progress Notes (Signed)
ELECTROPHYSIOLOGY OFFICE NOTE   Patient ID: Gary Hutchinson MRN: [id number], DOB/AGE: [birth year]   Date of Visit: 01/17/2014  Primary Physician: Gary Corn, MD Primary Cardiologist: Gary Romney, MD Primary EP: Gary Frame, MD Reason for Visit: EP/device follow-up  History of Present Illness  Gary Hutchinson is a 53 y.o. male with NICM (EF 25-30% by echo 2013, most recent 2014 with improvement to 40%) s/p single chamber ICD implant (Medtronic), paroxysmal VT, chronic systolic heart failure, HTN, DM, OSA and morbid obesity. He presents today for routine EP/device follow-up. He last saw Dr. Johney Hutchinson in 2012. Today, he reports he is doing well and has no complaints. He has been followed closely by CHF clinic. He denies chest pain or shortness of breath. He denies palpitations, dizziness, near syncope or syncope. He denies ICD shocks. He denies LE swelling, orthopnea or PND. He is compliant with medications.  Past Medical History Past Medical History  Diagnosis Date  . Cardiomyopathy     nonischemic  . Ventricular tachycardia     s/p MDT ICD implant  . Alcohol abuse     now quit  . Pulmonary hypertension   . Diverticulosis of colon   . Hemorrhoids, internal   . Cholecystitis   . Dyspepsia   . Morbid obesity   . Obstructive sleep apnea   . Anemia     iron defi  . Hypertension   . Gout   . Diabetes mellitus   . Chronic systolic congestive heart failure   . Asthma     Past Surgical History Past Surgical History  Procedure Laterality Date  . Icd implantation      ICD- Medtronic 9/09  . Cardiac catheterization  03/08/2004    EF 25-30%  . US echocardiography  08/22/2008    EF 30-35%  . Transthoracic echocardiogram  05/27/2008    EF 30-35%    Allergies/Intolerances No Known Allergies  Current Home Medications Current Outpatient Prescriptions  Medication Sig Dispense Refill  . acetaminophen (TYLENOL) 325 MG tablet Take 2 tablets (650 mg total) by mouth every 6 (six) hours as needed.       Marland Kitchen albuterol (PROAIR HFA) 108 (90 BASE) MCG/ACT inhaler Inhale 2 puffs into the lungs every 6 (six) hours as needed. For wheezing or shortness of breath      . allopurinol (ZYLOPRIM) 100 MG tablet Take 1 tablet by mouth daily.      Marland Kitchen aspirin 81 MG tablet Take 81 mg by mouth daily.      . bisoprolol (ZEBETA) 10 MG tablet TAKE 1 TABLET (10 MG TOTAL) BY MOUTH DAILY.  30 tablet  6  . colchicine 0.6 MG tablet Take 0.6 mg by mouth daily as needed. Gout flare ups      . digoxin (LANOXIN) 0.25 MG tablet Take 0.5 tablets (125 mcg total) by mouth every other day.  15 tablet  6  . fluticasone (FLONASE) 50 MCG/ACT nasal spray Place 2 sprays into the nose as needed.       . Fluticasone-Salmeterol (ADVAIR DISKUS) 250-50 MCG/DOSE AEPB Inhale 1 puff into the lungs every 12 (twelve) hours as needed. For wheezing or shortness of breath      . glipizide-metformin (METAGLIP) 2.5-250 MG per tablet Take 1 tablet by mouth 2 (two) times daily before a meal.        . metolazone (ZAROXOLYN) 2.5 MG tablet Take 2.5 mg by mouth as needed. Every Tue and Sat      . montelukast (SINGULAIR) 10 MG tablet Take  10 mg by mouth daily as needed. For shortness of breath or wheezing      . ramipril (ALTACE) 10 MG tablet Take 10 mg by mouth daily.      . simvastatin (ZOCOR) 40 MG tablet Take 40 mg by mouth at bedtime.        Marland Kitchen. spironolactone (ALDACTONE) 25 MG tablet Restart 0.5 tablets (12.5 mg total) by mouth daily on June 10.      . torsemide (DEMADEX) 20 MG tablet Take 40 mg by mouth 2 (two) times daily.       . traMADol (ULTRAM) 50 MG tablet Take 1 tablet (50 mg total) by mouth every 8 (eight) hours as needed.  20 tablet  0   No current facility-administered medications for this visit.    Social History History   Social History  . Marital Status: Single    Spouse Name: N/A    Number of Children: 4  . Years of Education: N/A   Occupational History  .     Social History Main Topics  . Smoking status: Never Smoker   .  Smokeless tobacco: Not on file  . Alcohol Use: No     Comment: former heavy ETOH, quit  . Drug Use: No  . Sexual Activity: Not on file   Other Topics Concern  . Not on file   Social History Narrative   disabled     Review of Systems General: No chills, fever, night sweats or weight changes Cardiovascular: No chest pain, dyspnea on exertion, edema, orthopnea, palpitations, paroxysmal nocturnal dyspnea Dermatological: No rash, lesions or masses Respiratory: No cough, dyspnea Urologic: No hematuria, dysuria Abdominal: No nausea, vomiting, diarrhea, bright red blood per rectum, melena, or hematemesis Neurologic: No visual changes, weakness, changes in mental status All other systems reviewed and are otherwise negative except as noted above.  Physical Exam Vitals: Blood pressure 124/80, pulse 96, height 5\' 5"  (1.651 m), weight 356 lb (161.481 kg).  General: Well developed, well appearing 53 y.o. male in no acute distress. HEENT: Normocephalic, atraumatic. EOMs intact. Sclera nonicteric. Oropharynx clear.  Neck: Supple. Difficult to assess JVD due to body habitus but does not appear elevated. Lungs: Distant breath sounds. Respirations regular and unlabored, CTA bilaterally. No wheezes, rales or rhonchi. Heart: Distant heart sounds. RRR. S1, S2 present. No murmurs, rub, S3 or S4. Abdomen: Large but soft, non-distended.  Extremities: No clubbing, cyanosis or edema. PT/Radials 2+ and equal bilaterally. Psych: Normal affect. Neuro: Alert and oriented X 3. Moves all extremities spontaneously.   Diagnostics Device interrogation today - Normal device function. Threshold and sensing consistent with previous device measurements. Impedance trends stable over time. No evidence of any ventricular arrhythmias. Histogram distribution appropriate for patient and level of activity. No changes made this session. Device programmed at appropriate safety margins. Device programmed to optimize intrinsic  conduction. Pt enrolled in remote follow-up.   Assessment and Plan  1. NICM s/p single chamber ICD implant Normal device function No VT/VF episodes No programming changes made Continue routine remote ICD follow-up every 3 months Return for follow-up with Dr. Johney FrameAllred in one year  2. Paroxysmal VT Stable No VT/VF episodes on device interrogation today  3. Chronic systolic HF Stable without worsening HF symptoms OptiVol stable Continue medical therapy  Continue regular follow-up with CHF clinic / Dr. Gala RomneyBensimhon  Signed, Minda MeoBrooke O Shaleta Ruacho, [name] 01/17/2014, 10:16 AM

## 2014-01-19 ENCOUNTER — Encounter (HOSPITAL_COMMUNITY): Payer: Self-pay | Admitting: Cardiology

## 2014-02-15 ENCOUNTER — Encounter (HOSPITAL_COMMUNITY): Payer: Self-pay

## 2014-02-15 ENCOUNTER — Ambulatory Visit (HOSPITAL_COMMUNITY)
Admission: RE | Admit: 2014-02-15 | Discharge: 2014-02-15 | Disposition: A | Discharge status: Home / Self Care | Payer: Medicare Other | Source: Ambulatory Visit | Attending: Internal Medicine | Admitting: Internal Medicine

## 2014-02-15 VITALS — BP 100/92 | HR 93 | Wt 354.8 lb

## 2014-02-15 DIAGNOSIS — G4733 Obstructive sleep apnea (adult) (pediatric): Secondary | ICD-10-CM | POA: Insufficient documentation

## 2014-02-15 DIAGNOSIS — I509 Heart failure, unspecified: Secondary | ICD-10-CM

## 2014-02-15 DIAGNOSIS — I472 Ventricular tachycardia, unspecified: Secondary | ICD-10-CM

## 2014-02-15 DIAGNOSIS — I10 Essential (primary) hypertension: Secondary | ICD-10-CM | POA: Insufficient documentation

## 2014-02-15 DIAGNOSIS — I5022 Chronic systolic (congestive) heart failure: Secondary | ICD-10-CM | POA: Insufficient documentation

## 2014-02-15 DIAGNOSIS — E669 Obesity, unspecified: Secondary | ICD-10-CM

## 2014-02-15 DIAGNOSIS — I4729 Other ventricular tachycardia: Secondary | ICD-10-CM

## 2014-02-15 LAB — BASIC METABOLIC PANEL
BUN: 39 mg/dL — ABNORMAL HIGH (ref 6–23)
CALCIUM: 9 mg/dL (ref 8.4–10.5)
CO2: 28 mEq/L (ref 19–32)
Chloride: 94 mEq/L — ABNORMAL LOW (ref 96–112)
Creatinine, Ser: 1.7 mg/dL — ABNORMAL HIGH (ref 0.50–1.35)
GFR calc Af Amer: 51 mL/min — ABNORMAL LOW (ref >90)
GFR calc non Af Amer: 44 mL/min — ABNORMAL LOW (ref >90)
Glucose, Bld: 227 mg/dL — ABNORMAL HIGH (ref 70–99)
Potassium: 3.8 mEq/L (ref 3.7–5.3)
Sodium: 137 mEq/L (ref 137–147)

## 2014-02-15 LAB — DIGOXIN LEVEL: Digoxin Level: <0.3 ng/mL — ABNORMAL LOW (ref 0.8–2.0)

## 2014-02-15 MED ORDER — TORSEMIDE 20 MG PO TABS: 40.0000 mg | ORAL_TABLET | Freq: Two times a day (BID) | ORAL | Status: DC

## 2014-02-15 MED ORDER — METOLAZONE 2.5 MG PO TABS: 2.5000 mg | ORAL_TABLET | ORAL | Status: DC

## 2014-02-15 MED ORDER — POTASSIUM CHLORIDE CRYS ER 20 MEQ PO TBCR: 20.0000 meq | EXTENDED_RELEASE_TABLET | ORAL | Status: DC

## 2014-02-15 MED ORDER — SIMVASTATIN 40 MG PO TABS: 40.0000 mg | ORAL_TABLET | Freq: Every day | ORAL | Status: DC

## 2014-02-15 NOTE — Patient Instructions (Signed)
Take Metolazone 2.5 mg every Tue and Sat  Take K-dur (Potassium) 20 meq on Tue and Sat when you take Metolazone  Labs today  We will contact you in 3 months to schedule your next appointment.

## 2014-02-15 NOTE — Progress Notes (Signed)
Patient ID: Gary Hutchinson, male   DOB: September 03, 1961, 53 y.o.   MRN: [id number] PCP: Creola Corn EP: Hillis Range  HPI: Gary Hutchinson is a 53 year old male with a history of chronic systolic heart failure, EF 25-30% (04/2012), NICM, HTN, DM II,  OSA on CPAP, history of ventricular tachycardia status post AICD placement in 2009, and morbid obesity.    04/16/12 admitted to [facility] and treated for volume overload with subsequent re-admit the next week for volume overload. Sluggish response to IV Lasix and placed on Milrinone. D/C weight 151 kg.  Patient seems symptomatically stable.  Main limitation currently is joint pain.  Weight is up 1 lb compared to prior appointment.  He sometimes forgets is evening torsemide and has been only using metolazone as needed (about once weekly).  He is not short of breath on flat ground. He is short of breath walking up a hill or stairs.  Uses CPAP nightly.   Spironolactone continues at 12.5 mg daily due to hyperkalemia noted when increased to 25 mg daily.   Optivol: Fluid index has been above threshold since 4/15.   Echo 05/03/12: EF 25-30%.  Grade 1 diastolic dysfunction.  Mild MR.  Mod dilated LA.          07/27/13: EF 40%, diff HK, MR mild, LA mild/mod dilated   Labs  03/08/13 K 3.8 Creatinine 1.02  07/27/13 K 4.7 Creatinine 1.61 Dig level 0.9 ---> dig cut back to 0.125 mg every other day 10/10/13 K 3.7 Creatinine 1.4   ROS: All systems negative except as listed in HPI, PMH and Problem List.  Past Medical History  Diagnosis Date  . Cardiomyopathy     nonischemic  . Ventricular tachycardia     s/p MDT ICD implant  . Alcohol abuse     now quit  . Pulmonary hypertension   . Diverticulosis of colon   . Hemorrhoids, internal   . Cholecystitis   . Dyspepsia   . Morbid obesity   . Obstructive sleep apnea   . Anemia     iron defi  . Hypertension   . Gout   . Diabetes mellitus   . Chronic systolic congestive heart failure   . Asthma     Current Outpatient  Prescriptions  Medication Sig Dispense Refill  . acetaminophen (TYLENOL) 325 MG tablet Take 2 tablets (650 mg total) by mouth every 6 (six) hours as needed.      Marland Kitchen albuterol (PROAIR HFA) 108 (90 BASE) MCG/ACT inhaler Inhale 2 puffs into the lungs every 6 (six) hours as needed. For wheezing or shortness of breath      . allopurinol (ZYLOPRIM) 100 MG tablet Take 1 tablet by mouth daily.      Marland Kitchen aspirin 81 MG tablet Take 81 mg by mouth daily.      . bisoprolol (ZEBETA) 10 MG tablet TAKE 1 TABLET (10 MG TOTAL) BY MOUTH DAILY.  30 tablet  6  . colchicine 0.6 MG tablet Take 0.6 mg by mouth daily as needed. Gout flare ups      . digoxin (LANOXIN) 0.25 MG tablet Take 0.5 tablets (125 mcg total) by mouth every other day.  15 tablet  6  . fluticasone (FLONASE) 50 MCG/ACT nasal spray Place 2 sprays into the nose as needed.       . Fluticasone-Salmeterol (ADVAIR DISKUS) 250-50 MCG/DOSE AEPB Inhale 1 puff into the lungs every 12 (twelve) hours as needed. For wheezing or shortness of breath      .  glipizide-metformin (METAGLIP) 2.5-250 MG per tablet Take 1 tablet by mouth 2 (two) times daily before a meal.        . metolazone (ZAROXOLYN) 2.5 MG tablet Take 1 tablet (2.5 mg total) by mouth 2 (two) times a week. Every Tue and Sat  12 tablet  3  . montelukast (SINGULAIR) 10 MG tablet Take 10 mg by mouth daily as needed. For shortness of breath or wheezing      . ramipril (ALTACE) 10 MG tablet Take 10 mg by mouth daily.      . simvastatin (ZOCOR) 40 MG tablet Take 1 tablet (40 mg total) by mouth at bedtime.  30 tablet  6  . spironolactone (ALDACTONE) 25 MG tablet Restart 0.5 tablets (12.5 mg total) by mouth daily on June 10.      . torsemide (DEMADEX) 20 MG tablet Take 2 tablets (40 mg total) by mouth 2 (two) times daily.  120 tablet  3  . traMADol (ULTRAM) 50 MG tablet Take 1 tablet (50 mg total) by mouth every 8 (eight) hours as needed.  20 tablet  0  . potassium chloride SA (K-DUR,KLOR-CON) 20 MEQ tablet Take 1  tablet (20 mEq total) by mouth 2 (two) times a week. Every Tue and Sat with Metolazone  12 tablet  3   No current facility-administered medications for this encounter.     Filed Vitals:   02/15/14 0850  BP: 100/92  Pulse: 93  Weight: 354 lb 12.8 oz (160.936 kg)  SpO2: 95%    PHYSICAL EXAM: General:  Well appearing. No resp difficulty; obesee HEENT: normal Neck: Thick. JVP 9-10 cm. Carotids 2+ bilaterally; no bruits. No lymphadenopathy or thryomegaly appreciated. Cor: PMI nonpalpable. Regular rate & rhythm. No rubs, gallops.   2/6 TR murmur. Lungs: clear Abdomen: obese, soft, nontender, nondistended.  Good bowel sounds. Extremities: no cyanosis, clubbing, rash, trace edema Neuro: alert & orientedx3, cranial nerves grossly intact. Moves all 4 extremities w/o difficulty. Affect pleasant.  ASSESSMENT & PLAN:   1) Chronic systolic HF: NICM s/p ICD Medtronic, EF 40% (07/2013).  NYHA II symptoms. He looks volume overloaded on exam today, confirmed by Optivol. - Continue torsemide 40 mg BID and increase metolazone to 2.5 mg on Tuesdays and Saturdays.  This week only, he'll take a dose on Thursday. Take KCl 20 mEq on metolazone days.  - Continue spironolactone 12.5 mg daily.  Unable to increase spiro due to hyperkalemia.  - Bisoprolol at goal dose 10 mg daily. Continue  Ramipril 10 mg daily.   - Check BMET and digoxin level today and again in 2 wks.  - Reinforced the need and importance of daily weights, a low sodium diet, and fluid restriction (less than 2 L a day). Instructed to call the HF clinic if weight increases more than 3 lbs overnight or 5 lbs in a week.  2) HTN: controlled on BB, ACE-I, and spiro 3) OSA  - Continue to wear CPAP nightly 4) VTach, s/p ICD 2009: no shocks from ICD 5) Obesity: Reinforced the need to lose weight and cut back on his portions. Encouraged to start exercising.  Followup in 3 months unless symptomatically worse.   Laurey MoraleDalton S Willia Genrich 02/15/2014 4:47  PM

## 2014-02-17 ENCOUNTER — Other Ambulatory Visit (HOSPITAL_COMMUNITY): Payer: Self-pay

## 2014-02-17 ENCOUNTER — Telehealth (HOSPITAL_COMMUNITY): Payer: Self-pay

## 2014-02-17 MED ORDER — RAMIPRIL 5 MG PO CAPS: 5.0000 mg | ORAL_CAPSULE | Freq: Every day | ORAL | Status: DC

## 2014-02-17 NOTE — Telephone Encounter (Signed)
Patient made aware of lab results, instructed to decrease ramipril to 5mg  once daily.  New Rx sent to preferred pharmacy electronically.  Will come to our clinic for BMET redraw 02/22/14. [address] [facility]

## 2014-02-21 ENCOUNTER — Ambulatory Visit (HOSPITAL_COMMUNITY)
Admission: RE | Admit: 2014-02-21 | Discharge: 2014-02-21 | Disposition: A | Discharge status: Home / Self Care | Payer: Medicare Other | Source: Ambulatory Visit | Attending: Internal Medicine | Admitting: Internal Medicine

## 2014-02-21 DIAGNOSIS — I5022 Chronic systolic (congestive) heart failure: Secondary | ICD-10-CM

## 2014-02-22 ENCOUNTER — Other Ambulatory Visit (HOSPITAL_COMMUNITY): Payer: Medicare Other

## 2014-02-28 ENCOUNTER — Encounter (HOSPITAL_COMMUNITY): Payer: Self-pay | Admitting: *Deleted

## 2014-02-28 NOTE — Progress Notes (Signed)
Heart Failure Diagnostics Follow Up Form - [employer]    Fluid Index        _X__   Fluid index trending with baseline, had been elevated but back down       ___   Fluid index trending up but below threshold       ___   Fluid index above threshold       ___   More than 3 threshold crossings this year        Current diuretic dose: Marland Kitchen.                                                                                                  Baseline weight: .                                      Current Weight: .                                     Patient Activity        Current activity level: .    2 & 1/2 hours daily                                                     Baseline activity level: .    About 2 & 1/2 hours daily                                                      AT/AF Burden        ___  Increase in AT/AF burden       ___  New onset of AT/AF       _X__  No AT/AF         ICD Therapies        ___ 1 or more ICD shocks    Interventions       ___  Adjust medications: .                                                                                                   ___  Labs: .  ___  Need for follow up appointment: .                                                                                _X__  HF diagnostics follow up: .    04/03/14                                                                                      ___  Review with EP: Marland Kitchen                                                                                                         Patient notified: Date: .    02/28/14                            By: .       Phone   .  x     Letter

## 2014-03-06 ENCOUNTER — Inpatient Hospital Stay (HOSPITAL_COMMUNITY)
Admission: EM | Admit: 2014-03-06 | Discharge: 2014-03-13 | DRG: 291 | Disposition: A | Discharge status: Home / Self Care | Payer: Medicare Other | Attending: Internal Medicine | Admitting: Internal Medicine

## 2014-03-06 ENCOUNTER — Encounter (HOSPITAL_COMMUNITY): Payer: Self-pay | Admitting: Emergency Medicine

## 2014-03-06 ENCOUNTER — Emergency Department (HOSPITAL_COMMUNITY): Payer: Medicare Other

## 2014-03-06 DIAGNOSIS — M109 Gout, unspecified: Secondary | ICD-10-CM | POA: Diagnosis present

## 2014-03-06 DIAGNOSIS — I10 Essential (primary) hypertension: Secondary | ICD-10-CM | POA: Diagnosis present

## 2014-03-06 DIAGNOSIS — Z7982 Long term (current) use of aspirin: Secondary | ICD-10-CM

## 2014-03-06 DIAGNOSIS — Z7901 Long term (current) use of anticoagulants: Secondary | ICD-10-CM

## 2014-03-06 DIAGNOSIS — I428 Other cardiomyopathies: Secondary | ICD-10-CM | POA: Diagnosis present

## 2014-03-06 DIAGNOSIS — R570 Cardiogenic shock: Secondary | ICD-10-CM | POA: Diagnosis present

## 2014-03-06 DIAGNOSIS — E876 Hypokalemia: Secondary | ICD-10-CM | POA: Diagnosis not present

## 2014-03-06 DIAGNOSIS — Z95810 Presence of automatic (implantable) cardiac defibrillator: Secondary | ICD-10-CM

## 2014-03-06 DIAGNOSIS — J45909 Unspecified asthma, uncomplicated: Secondary | ICD-10-CM | POA: Diagnosis present

## 2014-03-06 DIAGNOSIS — IMO0002 Reserved for concepts with insufficient information to code with codable children: Secondary | ICD-10-CM | POA: Diagnosis present

## 2014-03-06 DIAGNOSIS — Z79899 Other long term (current) drug therapy: Secondary | ICD-10-CM

## 2014-03-06 DIAGNOSIS — I2789 Other specified pulmonary heart diseases: Secondary | ICD-10-CM | POA: Diagnosis present

## 2014-03-06 DIAGNOSIS — I509 Heart failure, unspecified: Secondary | ICD-10-CM | POA: Diagnosis present

## 2014-03-06 DIAGNOSIS — I5043 Acute on chronic combined systolic (congestive) and diastolic (congestive) heart failure: Secondary | ICD-10-CM | POA: Diagnosis present

## 2014-03-06 DIAGNOSIS — N179 Acute kidney failure, unspecified: Secondary | ICD-10-CM | POA: Diagnosis present

## 2014-03-06 DIAGNOSIS — Z6841 Body Mass Index (BMI) 40.0 and over, adult: Secondary | ICD-10-CM

## 2014-03-06 DIAGNOSIS — G4733 Obstructive sleep apnea (adult) (pediatric): Secondary | ICD-10-CM | POA: Diagnosis present

## 2014-03-06 DIAGNOSIS — K5730 Diverticulosis of large intestine without perforation or abscess without bleeding: Secondary | ICD-10-CM | POA: Diagnosis present

## 2014-03-06 DIAGNOSIS — I4891 Unspecified atrial fibrillation: Secondary | ICD-10-CM

## 2014-03-06 DIAGNOSIS — I130 Hypertensive heart and chronic kidney disease with heart failure and stage 1 through stage 4 chronic kidney disease, or unspecified chronic kidney disease: Principal | ICD-10-CM | POA: Diagnosis present

## 2014-03-06 DIAGNOSIS — N189 Chronic kidney disease, unspecified: Secondary | ICD-10-CM | POA: Diagnosis present

## 2014-03-06 DIAGNOSIS — G9340 Encephalopathy, unspecified: Secondary | ICD-10-CM | POA: Diagnosis present

## 2014-03-06 DIAGNOSIS — E1165 Type 2 diabetes mellitus with hyperglycemia: Secondary | ICD-10-CM | POA: Diagnosis present

## 2014-03-06 DIAGNOSIS — I5023 Acute on chronic systolic (congestive) heart failure: Secondary | ICD-10-CM

## 2014-03-06 DIAGNOSIS — D649 Anemia, unspecified: Secondary | ICD-10-CM | POA: Diagnosis present

## 2014-03-06 DIAGNOSIS — E119 Type 2 diabetes mellitus without complications: Secondary | ICD-10-CM | POA: Diagnosis present

## 2014-03-06 DIAGNOSIS — Z794 Long term (current) use of insulin: Secondary | ICD-10-CM

## 2014-03-06 LAB — BASIC METABOLIC PANEL
BUN: 57 mg/dL — ABNORMAL HIGH (ref 6–23)
CALCIUM: 10 mg/dL (ref 8.4–10.5)
CO2: 24 meq/L (ref 19–32)
Chloride: 93 mEq/L — ABNORMAL LOW (ref 96–112)
Creatinine, Ser: 2.49 mg/dL — ABNORMAL HIGH (ref 0.50–1.35)
GFR calc Af Amer: 32 mL/min — ABNORMAL LOW (ref >90)
GFR calc non Af Amer: 28 mL/min — ABNORMAL LOW (ref >90)
Glucose, Bld: 259 mg/dL — ABNORMAL HIGH (ref 70–99)
Potassium: 4.1 mEq/L (ref 3.7–5.3)
Sodium: 136 mEq/L — ABNORMAL LOW (ref 137–147)

## 2014-03-06 LAB — TROPONIN I
Troponin I: <0.3 ng/mL (ref <0.30)
Troponin I: <0.3 ng/mL (ref <0.30)

## 2014-03-06 LAB — GLUCOSE, CAPILLARY
GLUCOSE-CAPILLARY: 265 mg/dL — AB (ref 70–99)
GLUCOSE-CAPILLARY: 291 mg/dL — AB (ref 70–99)
GLUCOSE-CAPILLARY: 256 mg/dL — AB (ref 70–99)
Glucose-Capillary: 202 mg/dL — ABNORMAL HIGH (ref 70–99)

## 2014-03-06 LAB — CBC WITH DIFFERENTIAL/PLATELET
Basophils Absolute: 0 10*3/uL (ref 0.0–0.1)
Basophils Relative: 0 % (ref 0–1)
Eosinophils Absolute: 0.1 10*3/uL (ref 0.0–0.7)
Eosinophils Relative: 2 % (ref 0–5)
HCT: 38.6 % — ABNORMAL LOW (ref 39.0–52.0)
Hemoglobin: 12.9 g/dL — ABNORMAL LOW (ref 13.0–17.0)
LYMPHS PCT: 35 % (ref 12–46)
Lymphs Abs: 2.8 10*3/uL (ref 0.7–4.0)
MCH: 29.4 pg (ref 26.0–34.0)
MCHC: 33.4 g/dL (ref 30.0–36.0)
MCV: 87.9 fL (ref 78.0–100.0)
Monocytes Absolute: 0.7 10*3/uL (ref 0.1–1.0)
Monocytes Relative: 9 % (ref 3–12)
NEUTROS ABS: 4.2 10*3/uL (ref 1.7–7.7)
Neutrophils Relative %: 54 % (ref 43–77)
Platelets: 176 10*3/uL (ref 150–400)
RBC: 4.39 MIL/uL (ref 4.22–5.81)
RDW: 14.7 % (ref 11.5–15.5)
WBC: 7.9 10*3/uL (ref 4.0–10.5)

## 2014-03-06 LAB — PRO B NATRIURETIC PEPTIDE: PRO B NATRI PEPTIDE: 9697 pg/mL — AB (ref 0–125)

## 2014-03-06 LAB — DIGOXIN LEVEL: Digoxin Level: 0.3 ng/mL — ABNORMAL LOW (ref 0.8–2.0)

## 2014-03-06 LAB — MAGNESIUM: Magnesium: 2.2 mg/dL (ref 1.5–2.5)

## 2014-03-06 MED ORDER — BISOPROLOL FUMARATE 5 MG PO TABS
2.5000 mg | ORAL_TABLET | Freq: Every day | ORAL | Status: DC
  Filled 2014-03-06: qty 0.5

## 2014-03-06 MED ORDER — INSULIN ASPART 100 UNIT/ML ~~LOC~~ SOLN
0.0000 [IU] | Freq: Three times a day (TID) | SUBCUTANEOUS | Status: DC
  Administered 2014-03-06 – 2014-03-07 (×3): 5 [IU] via SUBCUTANEOUS
  Administered 2014-03-07: 7 [IU] via SUBCUTANEOUS
  Administered 2014-03-08 (×3): 3 [IU] via SUBCUTANEOUS
  Administered 2014-03-09: 5 [IU] via SUBCUTANEOUS
  Administered 2014-03-09: 3 [IU] via SUBCUTANEOUS
  Administered 2014-03-09: 5 [IU] via SUBCUTANEOUS
  Administered 2014-03-10: 2 [IU] via SUBCUTANEOUS
  Administered 2014-03-10: 5 [IU] via SUBCUTANEOUS
  Administered 2014-03-10 – 2014-03-11 (×2): 3 [IU] via SUBCUTANEOUS
  Administered 2014-03-11: 2 [IU] via SUBCUTANEOUS
  Administered 2014-03-11: 3 [IU] via SUBCUTANEOUS
  Administered 2014-03-12 (×2): 2 [IU] via SUBCUTANEOUS
  Administered 2014-03-12: 5 [IU] via SUBCUTANEOUS
  Administered 2014-03-13: 3 [IU] via SUBCUTANEOUS
  Administered 2014-03-13: 2 [IU] via SUBCUTANEOUS

## 2014-03-06 MED ORDER — FUROSEMIDE 10 MG/ML IJ SOLN
10.0000 mg/h | INTRAMUSCULAR | Status: DC
  Administered 2014-03-06: 10 mg/h via INTRAVENOUS
  Filled 2014-03-06 (×2): qty 25

## 2014-03-06 MED ORDER — ACETAMINOPHEN 325 MG PO TABS: 650.0000 mg | ORAL_TABLET | Freq: Four times a day (QID) | ORAL | Status: DC | PRN

## 2014-03-06 MED ORDER — SODIUM CHLORIDE 0.9 % IV SOLN: 250.0000 mL | INTRAVENOUS | Status: DC | PRN

## 2014-03-06 MED ORDER — ALLOPURINOL 100 MG PO TABS
100.0000 mg | ORAL_TABLET | Freq: Every day | ORAL | Status: DC
  Administered 2014-03-06 – 2014-03-13 (×8): 100 mg via ORAL
  Filled 2014-03-06 (×8): qty 1

## 2014-03-06 MED ORDER — POTASSIUM CHLORIDE CRYS ER 20 MEQ PO TBCR
20.0000 meq | EXTENDED_RELEASE_TABLET | Freq: Every day | ORAL | Status: DC
  Administered 2014-03-06: 20 meq via ORAL
  Filled 2014-03-06 (×2): qty 1

## 2014-03-06 MED ORDER — AMIODARONE LOAD VIA INFUSION
150.0000 mg | Freq: Once | INTRAVENOUS | Status: AC
  Administered 2014-03-06: 150 mg via INTRAVENOUS
  Filled 2014-03-06: qty 83.34

## 2014-03-06 MED ORDER — SODIUM CHLORIDE 0.9 % IJ SOLN
3.0000 mL | Freq: Two times a day (BID) | INTRAMUSCULAR | Status: DC
  Administered 2014-03-06 – 2014-03-12 (×12): 3 mL via INTRAVENOUS

## 2014-03-06 MED ORDER — FLUTICASONE PROPIONATE 50 MCG/ACT NA SUSP: 2.0000 | NASAL | Status: DC | PRN

## 2014-03-06 MED ORDER — HEPARIN BOLUS VIA INFUSION
4000.0000 [IU] | Freq: Once | INTRAVENOUS | Status: AC
  Administered 2014-03-06: 4000 [IU] via INTRAVENOUS
  Filled 2014-03-06: qty 4000

## 2014-03-06 MED ORDER — SENNOSIDES-DOCUSATE SODIUM 8.6-50 MG PO TABS
1.0000 | ORAL_TABLET | Freq: Two times a day (BID) | ORAL | Status: DC | PRN
  Administered 2014-03-06 – 2014-03-12 (×2): 2 via ORAL
  Filled 2014-03-06 (×2): qty 2

## 2014-03-06 MED ORDER — EZETIMIBE 10 MG PO TABS
10.0000 mg | ORAL_TABLET | Freq: Every day | ORAL | Status: DC
  Administered 2014-03-06 – 2014-03-12 (×7): 10 mg via ORAL
  Filled 2014-03-06 (×8): qty 1

## 2014-03-06 MED ORDER — ATORVASTATIN CALCIUM 20 MG PO TABS
20.0000 mg | ORAL_TABLET | Freq: Every day | ORAL | Status: DC
  Administered 2014-03-06 – 2014-03-12 (×7): 20 mg via ORAL
  Filled 2014-03-06 (×8): qty 1

## 2014-03-06 MED ORDER — FUROSEMIDE 10 MG/ML IJ SOLN
100.0000 mg | Freq: Two times a day (BID) | INTRAVENOUS | Status: DC
  Filled 2014-03-06: qty 10

## 2014-03-06 MED ORDER — HEPARIN (PORCINE) IN NACL 100-0.45 UNIT/ML-% IJ SOLN
1700.0000 [IU]/h | INTRAMUSCULAR | Status: AC
  Administered 2014-03-06: 1700 [IU]/h via INTRAVENOUS
  Filled 2014-03-06 (×2): qty 250

## 2014-03-06 MED ORDER — ASPIRIN EC 81 MG PO TBEC
81.0000 mg | DELAYED_RELEASE_TABLET | Freq: Every day | ORAL | Status: DC
  Administered 2014-03-06 – 2014-03-09 (×4): 81 mg via ORAL
  Filled 2014-03-06 (×5): qty 1

## 2014-03-06 MED ORDER — RIVAROXABAN 20 MG PO TABS
20.0000 mg | ORAL_TABLET | Freq: Every day | ORAL | Status: DC
  Administered 2014-03-06 – 2014-03-13 (×8): 20 mg via ORAL
  Filled 2014-03-06 (×9): qty 1

## 2014-03-06 MED ORDER — FUROSEMIDE 10 MG/ML IJ SOLN: 80.0000 mg | Freq: Two times a day (BID) | INTRAMUSCULAR | Status: DC

## 2014-03-06 MED ORDER — ALBUTEROL SULFATE (2.5 MG/3ML) 0.083% IN NEBU: 2.5000 mg | INHALATION_SOLUTION | Freq: Four times a day (QID) | RESPIRATORY_TRACT | Status: DC | PRN

## 2014-03-06 MED ORDER — FUROSEMIDE 10 MG/ML IJ SOLN
80.0000 mg | Freq: Once | INTRAMUSCULAR | Status: AC
  Administered 2014-03-06: 80 mg via INTRAVENOUS
  Filled 2014-03-06: qty 8

## 2014-03-06 MED ORDER — AMIODARONE HCL IN DEXTROSE 360-4.14 MG/200ML-% IV SOLN
30.0000 mg/h | INTRAVENOUS | Status: DC
  Administered 2014-03-06 – 2014-03-07 (×2): 30 mg/h via INTRAVENOUS
  Filled 2014-03-06 (×4): qty 200

## 2014-03-06 MED ORDER — SODIUM CHLORIDE 0.9 % IJ SOLN: 3.0000 mL | INTRAMUSCULAR | Status: DC | PRN

## 2014-03-06 MED ORDER — ONDANSETRON 8 MG/NS 50 ML IVPB
8.0000 mg | Freq: Once | INTRAVENOUS | Status: AC
  Administered 2014-03-06: 8 mg via INTRAVENOUS
  Filled 2014-03-06: qty 8

## 2014-03-06 MED ORDER — TRAMADOL HCL 50 MG PO TABS: 50.0000 mg | ORAL_TABLET | Freq: Four times a day (QID) | ORAL | Status: DC | PRN

## 2014-03-06 MED ORDER — SIMVASTATIN 40 MG PO TABS
40.0000 mg | ORAL_TABLET | Freq: Every day | ORAL | Status: DC
  Filled 2014-03-06: qty 1

## 2014-03-06 MED ORDER — AMIODARONE HCL IN DEXTROSE 360-4.14 MG/200ML-% IV SOLN
60.0000 mg/h | INTRAVENOUS | Status: AC
  Administered 2014-03-06: 60 mg/h via INTRAVENOUS
  Filled 2014-03-06: qty 200

## 2014-03-06 MED ORDER — MONTELUKAST SODIUM 10 MG PO TABS
10.0000 mg | ORAL_TABLET | Freq: Every day | ORAL | Status: DC
  Administered 2014-03-07 – 2014-03-12 (×6): 10 mg via ORAL
  Filled 2014-03-06 (×8): qty 1

## 2014-03-06 MED ORDER — MOMETASONE FURO-FORMOTEROL FUM 100-5 MCG/ACT IN AERO
2.0000 | INHALATION_SPRAY | Freq: Two times a day (BID) | RESPIRATORY_TRACT | Status: DC
  Administered 2014-03-09: 2 via RESPIRATORY_TRACT
  Filled 2014-03-06: qty 8.8

## 2014-03-06 MED ORDER — INSULIN ASPART 100 UNIT/ML ~~LOC~~ SOLN
0.0000 [IU] | Freq: Every day | SUBCUTANEOUS | Status: DC
  Administered 2014-03-06: 3 [IU] via SUBCUTANEOUS
  Administered 2014-03-07 – 2014-03-08 (×2): 2 [IU] via SUBCUTANEOUS

## 2014-03-06 MED ORDER — DIGOXIN 125 MCG PO TABS
125.0000 ug | ORAL_TABLET | ORAL | Status: DC
  Administered 2014-03-06: 125 ug via ORAL
  Filled 2014-03-06: qty 1

## 2014-03-06 NOTE — Progress Notes (Signed)
Pt from ED, pt denies pain, pt SOB at rest pt on 2 L nasal canula, pt in chair to help with breathing, pt given heart failure booklet, pt stable

## 2014-03-06 NOTE — Progress Notes (Signed)
Advanced Heart Failure Rounding Note   Subjective:    [name] is a 53 yo man with PMH of chronic systolic heart failure, last known EF 25-30%, nonischemic cardiomyopathy, T2DM, hypetension, OSA on CPAP, VT s/p ICD, morbid obesity last seen by Gary Hutchinson mid May 2015 . Weight at that time was 354 pounds.   [facility] with worsening shortness of breath and abdominal distention/weight gain consistent with acute on chronic heart failure. He started on IV lasix . Beta blocker stopped due to acute decompensation.  Ace stopped due to CKD. CEs negative. Weight unchanged but has only had 80 mg IV lasix at 04:00.   Denies SOB.   Creatinine 1.7> 2.4  Optivol.   Objective:   Weight Range:  Vital Signs:   Temp:  [97.8 F (36.6 C)-98.7 F (37.1 C)] 97.8 F (36.6 C) (06/01 0526) Pulse Rate:  [54-98] 98 (06/01 0526) Resp:  [16-20] 20 (06/01 0526) BP: (99-121)/(64-93) 104/72 mmHg (06/01 0526) SpO2:  [96 %-99 %] 97 % (06/01 0526) Weight:  [356 lb (161.481 kg)-356 lb 11.3 oz (161.8 kg)] 356 lb 11.3 oz (161.8 kg) (06/01 0558)    Weight change: Filed Weights   03/06/14 0211 03/06/14 0526 03/06/14 0558  Weight: 356 lb (161.481 kg) 356 lb 11.3 oz (161.8 kg) 356 lb 11.3 oz (161.8 kg)    Intake/Output:   Intake/Output Summary (Last 24 hours) at 03/06/14 0744 Last data filed at 03/06/14 0630  Gross per 24 hour  Intake      0 ml  Output    300 ml  Net   -300 ml     Physical Exam: General:  Chronically ill appearing. No resp difficulty Sitting in chair.  HEENT: normal Neck: supple. JVP to jaw  . Carotids 2+ bilat; no bruits. No lymphadenopathy or thryomegaly appreciated. Cor: PMI nondisplaced. Irregular rate & rhythm. No rubs, gallops or murmurs. Lungs: clear Abdomen: soft, nontender, +++distended. No hepatosplenomegaly. No bruits or masses. Good bowel sounds. Extremities: no cyanosis, clubbing, rash, edema R and LLE SCDs on  Neuro: alert & orientedx3, cranial nerves  grossly intact. moves all 4 extremities w/o difficulty. Affect pleasant  Telemetry: A fib 100s   Labs: Basic Metabolic Panel:  Recent Labs Lab 03/06/14 0240  NA 136*  K 4.1  CL 93*  CO2 24  GLUCOSE 259*  BUN 57*  CREATININE 2.49*  CALCIUM 10.0  MG 2.2    Liver Function Tests: No results found for this basename: AST, ALT, ALKPHOS, BILITOT, PROT, ALBUMIN,  in the last 168 hours No results found for this basename: LIPASE, AMYLASE,  in the last 168 hours No results found for this basename: AMMONIA,  in the last 168 hours  CBC:  Recent Labs Lab 03/06/14 0240  WBC 7.9  NEUTROABS 4.2  HGB 12.9*  HCT 38.6*  MCV 87.9  PLT 176    Cardiac Enzymes:  Recent Labs Lab 03/06/14 0240  TROPONINI <0.30    BNP: BNP (last 3 results)  Recent Labs  03/06/14 0240  PROBNP 9697.0*     Other results:  EKG:   Imaging: Dg Chest Port 1 View  03/06/2014   CLINICAL DATA:  Cardiomegaly and mild pulmonary edema.  Left atelectasis.  EXAM: PORTABLE CHEST - 1 VIEW  COMPARISON:  03/06/2013  FINDINGS: Chronic cardiopericardial enlargement. There is a left approach ICD lead which is partly imaged. Visible portions are unchanged. Interstitial coarsening with patchy density in the lower lungs. Bandlike opacity in the left  lower lung which is consistent with atelectasis. No visible effusion.  IMPRESSION: 1. Indistinct basilar opacities, favor mild edema over pneumonia. 2. Left-sided atelectasis.   Electronically Signed   By: Tiburcio Pea M.D.   On: 03/06/2014 03:10      Medications:     Scheduled Medications: . allopurinol  100 mg Oral Daily  . aspirin EC  81 mg Oral Daily  . digoxin  125 mcg Oral QODAY  . ezetimibe  10 mg Oral Daily  . furosemide  100 mg Intravenous BID  . mometasone-formoterol  2 puff Inhalation BID  . montelukast  10 mg Oral QHS  . potassium chloride SA  20 mEq Oral Daily  . simvastatin  40 mg Oral QHS  . sodium chloride  3 mL Intravenous Q12H      Infusions: . heparin 1,700 Units/hr (03/06/14 0644)     PRN Medications:  sodium chloride, acetaminophen, albuterol, fluticasone, sodium chloride, traMADol   Assessment:  1. A/C Systolic Heart Failure EF 40%  2. New onset A fib RVR  3. HTN 4. OSA 5. H/O V Tach S/P Medtronic ICD 6. Obesity  7. Gout  8. Cardiorenal Syndrome 1.7>2.4    Plan/Discussion:    Volume status elevated likely due in part due to dietary noncompliance and new onset A fib.   Renal function trending up. Milrinone used in the past. Will hold off for now given new onset A fib. Will start lasix drip at 10 mg per hour.  Hold off on BB was on bisoprolol. Continue digoxin for now check dig level for now. No Ace due to elevated creatinine. Consult dietitian.   Remains in A fib. Start amiodarone drip. On Heparin and start coumadin coumadin. Will need TEE-DC-CV if he doesn't convert.   Medtronic Rep to interrogate his device    Length of Stay: 0   Gary D Clegg NP-C  03/06/2014, 7:44 AM  Advanced Heart Failure Team Pager [phone] (M-F; 7a - 4p)  Please contact [facility] for night-coverage after hours (4p -7a ) and weekends on [email or web address]  Patient seen and examined with Gary Becket, NP. We discussed all aspects of the encounter. I agree with the assessment and plan as stated above.   Optivol interrogated personally with MDT rep at bedside. Shows volume overload with many crossings. Unable to assess when AF started.  I think major issue is that he is not tolerating AF. Will likely need TEE and DC-CV but he is not sure he wants to proceed with this. Will start amio for rate control. Will use Xarelto for anticoagulation. Agree with IV diuresis.   If goes for TEE/DC-CV will likely need anesthesia support given airway and size.   Gary Buckles Mykell Genao,MD 3:51 PM

## 2014-03-06 NOTE — ED Notes (Signed)
Sob  And abd pain for 2 days with medication changes.  No chest pain

## 2014-03-06 NOTE — Progress Notes (Signed)
PT Cancellation Note  Patient Details Name: Gary Hutchinson MRN: [id number] DOB: 08-Sep-1961   Cancelled Treatment:    Reason Eval/Treat Not Completed: Medical issues which prohibited therapy (currently on Amiodorone drip)   Rada Hay 03/06/2014, 3:50 PM Blanchard Kelch PT [phone]

## 2014-03-06 NOTE — ED Provider Notes (Signed)
CSN: [id number]     Arrival date & time 03/06/14  0203 History   First MD Initiated Contact with Patient 03/06/14 0215     Chief Complaint  Patient presents with  . Shortness of Breath     (Consider location/radiation/quality/duration/timing/severity/associated sxs/prior Treatment) HPI 53 year old male presents to emergency room from home with complaint of abdominal distention and shortness of breath.  Patient reports for the last 2 days he has had worsening dyspnea on exertion, orthopnea and abdominal distention.  Patient has a history of congestive heart failure, nonischemic cardiomyopathy, obesity, hypertension and diabetes.  He is followed by the heart failure clinic.  He was seen by them recently and they started him on Zaroxolyn twice a week.  Patient admits that he has not been weighing himself daily as instructed.  He feels that he may of been drinking more fluids and not sticking to his diet as advised.  Pharmacy tech reports the patient has not been taking his medications upper poorly according to his last clinic note.  He denies any chest pain.  Patient noted to be in atrial fibrillation.  He denies any palpitations.  No prior history of A. fib. Past Medical History  Diagnosis Date  . Cardiomyopathy     nonischemic  . Ventricular tachycardia     s/p MDT ICD implant  . Alcohol abuse     now quit  . Pulmonary hypertension   . Diverticulosis of colon   . Hemorrhoids, internal   . Cholecystitis   . Dyspepsia   . Morbid obesity   . Obstructive sleep apnea   . Anemia     iron defi  . Hypertension   . Gout   . Diabetes mellitus   . Chronic systolic congestive heart failure   . Asthma    Past Surgical History  Procedure Laterality Date  . Icd implantation      ICD- Medtronic 9/09  . Cardiac catheterization  03/08/2004    EF 25-30%  . US echocardiography  08/22/2008    EF 30-35%  . Transthoracic echocardiogram  05/27/2008    EF 30-35%   Family History  Problem Relation  Age of Onset  . Cancer Father   . Diabetes      grandparents  . Hypertension Mother    History  Substance Use Topics  . Smoking status: Never Smoker   . Smokeless tobacco: Not on file  . Alcohol Use: No     Comment: former heavy ETOH, quit    Review of Systems  See History of Present Illness; otherwise all other systems are reviewed and negative   Allergies  Review of patient's allergies indicates no known allergies.  Home Medications   Prior to Admission medications   Medication Sig Start Date End Date Taking? Authorizing Provider  acetaminophen (TYLENOL) 325 MG tablet Take 2 tablets (650 mg total) by mouth every 6 (six) hours as needed. 03/08/13  Yes [name], MD  albuterol (PROAIR HFA) 108 (90 BASE) MCG/ACT inhaler Inhale 2 puffs into the lungs every 6 (six) hours as needed. For wheezing or shortness of breath   Yes Historical Provider, MD  allopurinol (ZYLOPRIM) 100 MG tablet Take 1 tablet by mouth daily. 07/12/12  Yes Historical Provider, MD  aspirin 81 MG tablet Take 81 mg by mouth daily.   Yes Historical Provider, MD  colchicine 0.6 MG tablet Take 0.6 mg by mouth daily as needed. Gout flare ups   Yes Historical Provider, MD  digoxin (LANOXIN) 0.25 MG  tablet Take 0.5 tablets (125 mcg total) by mouth every other day. 10/10/13  Yes Amy D Clegg, NP  ezetimibe (ZETIA) 10 MG tablet Take 10 mg by mouth daily.   Yes Historical Provider, MD  fluticasone (FLONASE) 50 MCG/ACT nasal spray Place 2 sprays into the nose as needed.  08/04/12  Yes Historical Provider, MD  Fluticasone-Salmeterol (ADVAIR DISKUS) 250-50 MCG/DOSE AEPB Inhale 1 puff into the lungs every 12 (twelve) hours as needed. For wheezing or shortness of breath   Yes Historical Provider, MD  glipizide-metformin (METAGLIP) 2.5-250 MG per tablet Take 1 tablet by mouth 2 (two) times daily before a meal.     Yes Historical Provider, MD  metolazone (ZAROXOLYN) 2.5 MG tablet Take 1 tablet (2.5 mg total) by mouth 2 (two) times a  week. Every Tue and Sat 02/15/14  Yes Laurey Moralealton S McLean, MD  montelukast (SINGULAIR) 10 MG tablet Take 10 mg by mouth daily as needed. For shortness of breath or wheezing   Yes Historical Provider, MD  potassium chloride SA (K-DUR,KLOR-CON) 20 MEQ tablet Take 1 tablet (20 mEq total) by mouth 2 (two) times a week. Every Tue and Sat with Metolazone 02/15/14  Yes Laurey Moralealton S McLean, MD  ramipril (ALTACE) 5 MG capsule Take 1 capsule (5 mg total) by mouth daily. 02/17/14  Yes Laurey Moralealton S McLean, MD  simvastatin (ZOCOR) 40 MG tablet Take 1 tablet (40 mg total) by mouth at bedtime. 02/15/14  Yes Laurey Moralealton S McLean, MD  spironolactone (ALDACTONE) 25 MG tablet Restart 0.5 tablets (12.5 mg total) by mouth daily on June 10. 03/08/13  Yes Gwen PoundsJohn M Russo, MD  torsemide (DEMADEX) 20 MG tablet Take 2 tablets (40 mg total) by mouth 2 (two) times daily. 02/15/14  Yes Laurey Moralealton S McLean, MD  traMADol (ULTRAM) 50 MG tablet Take 1 tablet (50 mg total) by mouth every 8 (eight) hours as needed. 12/29/12  Yes Adlih Moreno-Coll, MD   BP 109/93  Pulse 98  Temp(Src) 98.7 F (37.1 C)  Resp 16  Ht 5\' 5"  (1.651 m)  Wt 356 lb (161.481 kg)  BMI 59.24 kg/m2  SpO2 96% Physical Exam  Nursing note and vitals reviewed. Constitutional: He is oriented to person, place, and time. He appears well-developed and well-nourished. He appears distressed.  HENT:  Head: Normocephalic and atraumatic.  Nose: Nose normal.  Mouth/Throat: Oropharynx is clear and moist.  Eyes: Conjunctivae and EOM are normal. Pupils are equal, round, and reactive to light.  Neck: Normal range of motion. Neck supple. No JVD present. No tracheal deviation present. No thyromegaly present.  Cardiovascular: Normal heart sounds and intact distal pulses.  Exam reveals no gallop and no friction rub.   No murmur heard. Irregular irregular Rate and rhythm  Pulmonary/Chest: Breath sounds normal. No stridor. He is in respiratory distress. He has no wheezes. He has no rales. He exhibits no  tenderness.  Patient has mild to moderate respiratory distress secondary to abdominal distention  Abdominal: Soft. Bowel sounds are normal. He exhibits distension (patient's abdomen is distended.  He has pitting edema.  He has a umbilical hernia that is soft and reducible). He exhibits no mass. There is no tenderness. There is no rebound and no guarding.  Musculoskeletal: Normal range of motion. He exhibits edema (trace edema in legs). He exhibits no tenderness.  Lymphadenopathy:    He has no cervical adenopathy.  Neurological: He is alert and oriented to person, place, and time. He exhibits normal muscle tone. Coordination normal.  Skin: Skin is  warm. No rash noted. He is diaphoretic. No erythema. No pallor.  Psychiatric: He has a normal mood and affect. His behavior is normal. Judgment and thought content normal.    ED Course  Procedures (including critical care time) Labs Review Labs Reviewed  CBC WITH DIFFERENTIAL - Abnormal; Notable for the following:    Hemoglobin 12.9 (*)    HCT 38.6 (*)    All other components within normal limits  BASIC METABOLIC PANEL - Abnormal; Notable for the following:    Sodium 136 (*)    Chloride 93 (*)    Glucose, Bld 259 (*)    BUN 57 (*)    Creatinine, Ser 2.49 (*)    GFR calc non Af Amer 28 (*)    GFR calc Af Amer 32 (*)    All other components within normal limits  PRO B NATRIURETIC PEPTIDE - Abnormal; Notable for the following:    Pro B Natriuretic peptide (BNP) 9697.0 (*)    All other components within normal limits  TROPONIN I  MAGNESIUM  DIGOXIN LEVEL    Imaging Review Dg Chest Port 1 View  03/06/2014   CLINICAL DATA:  Cardiomegaly and mild pulmonary edema.  Left atelectasis.  EXAM: PORTABLE CHEST - 1 VIEW  COMPARISON:  03/06/2013  FINDINGS: Chronic cardiopericardial enlargement. There is a left approach ICD lead which is partly imaged. Visible portions are unchanged. Interstitial coarsening with patchy density in the lower lungs.  Bandlike opacity in the left lower lung which is consistent with atelectasis. No visible effusion.  IMPRESSION: 1. Indistinct basilar opacities, favor mild edema over pneumonia. 2. Left-sided atelectasis.   Electronically Signed   By: Tiburcio Pea M.D.   On: 03/06/2014 03:10     EKG Interpretation   Date/Time:  Monday March 06 2014 02:10:57 EDT Ventricular Rate:  127 PR Interval:    QRS Duration: 128 QT Interval:  302 QTC Calculation: 438 R Axis:   -87 Text Interpretation:  Atrial fibrillation with rapid ventricular response  with premature ventricular or aberrantly conducted complexes Left axis  deviation Non-specific intra-ventricular conduction block Abnormal ECG  Confirmed by Ladashia Demarinis  MD, Aireanna Luellen ([id number]) on 03/06/2014 2:27:25 AM      MDM   Final diagnoses:  CHF exacerbation  New onset a-fib    53 year old male with what appears to be CHF exacerbation, new A. fib.  Initially his heart rate was in the 130s, but without intervention he is decreased down into the 90s to 100.  Will get chest x-ray, lab work.  Expect patient will need admission to hospital.  4:49 AM Patient with elevated BNP from his baseline.  Will give IV Lasix.  Case discussed with on-call cardiology for a Mission to their team.    Olivia Mackie, MD 03/06/14 [phone]

## 2014-03-06 NOTE — H&P (Signed)
Gary Hutchinson is an 52 y.o. male.     Chief Complaint: shortness of breath Primary Cardiologist: heart failure - Aundra Dubin seen last  HPI: Gary Hutchinson is a 53 yo man with PMH of chronic systolic heart failure, last known EF 25-30%, nonischemic cardiomyopathy, T2DM, hypetension, OSA on CPAP, VT s/p ICD, morbid obesity last seen by Dr. Aundra Dubin mid May 2015 who presents with worsening shortness of breath and abdominal distention/weight gain consistent with acute on chronic heart failure.  He's been taking more metolazone at home without improved UOP leading to presentation. No chest pain/pressure, no syncope. +Dry mouth. He feels a fatigued and less energy. Tolerating other medications.       Past Medical History  Diagnosis Date  . Cardiomyopathy     nonischemic  . Ventricular tachycardia     s/p MDT ICD implant  . Alcohol abuse     now quit  . Pulmonary hypertension   . Diverticulosis of colon   . Hemorrhoids, internal   . Cholecystitis   . Dyspepsia   . Morbid obesity   . Obstructive sleep apnea   . Anemia     iron defi  . Hypertension   . Gout   . Diabetes mellitus   . Chronic systolic congestive heart failure   . Asthma     Past Surgical History  Procedure Laterality Date  . Icd implantation      ICD- [employer] 9/09  . Cardiac catheterization  03/08/2004    EF 25-30%  . US echocardiography  08/22/2008    EF 30-35%  . Transthoracic echocardiogram  05/27/2008    EF 30-35%    Family History  Problem Relation Age of Onset  . Cancer Father   . Diabetes      grandparents  . Hypertension Mother    Social History:  reports that he has never smoked. He does not have any smokeless tobacco history on file. He reports that he does not drink alcohol or use illicit drugs.  Allergies: No Known Allergies  Medications Prior to Admission  Medication Sig Dispense Refill  . acetaminophen (TYLENOL) 325 MG tablet Take 2 tablets (650 mg total) by mouth every 6 (six) hours as  needed.      Marland Kitchen albuterol (PROAIR HFA) 108 (90 BASE) MCG/ACT inhaler Inhale 2 puffs into the lungs every 6 (six) hours as needed. For wheezing or shortness of breath      . allopurinol (ZYLOPRIM) 100 MG tablet Take 1 tablet by mouth daily.      Marland Kitchen aspirin 81 MG tablet Take 81 mg by mouth daily.      . colchicine 0.6 MG tablet Take 0.6 mg by mouth daily as needed. Gout flare ups      . digoxin (LANOXIN) 0.25 MG tablet Take 0.5 tablets (125 mcg total) by mouth every other day.  15 tablet  6  . ezetimibe (ZETIA) 10 MG tablet Take 10 mg by mouth daily.      . fluticasone (FLONASE) 50 MCG/ACT nasal spray Place 2 sprays into the nose as needed.       . Fluticasone-Salmeterol (ADVAIR DISKUS) 250-50 MCG/DOSE AEPB Inhale 1 puff into the lungs every 12 (twelve) hours as needed. For wheezing or shortness of breath      . glipizide-metformin (METAGLIP) 2.5-250 MG per tablet Take 1 tablet by mouth 2 (two) times daily before a meal.        . metolazone (ZAROXOLYN) 2.5 MG tablet Take 1 tablet (2.5 mg  total) by mouth 2 (two) times a week. Every Tue and Sat  12 tablet  3  . montelukast (SINGULAIR) 10 MG tablet Take 10 mg by mouth daily as needed. For shortness of breath or wheezing      . potassium chloride SA (K-DUR,KLOR-CON) 20 MEQ tablet Take 1 tablet (20 mEq total) by mouth 2 (two) times a week. Every Tue and Sat with Metolazone  12 tablet  3  . ramipril (ALTACE) 5 MG capsule Take 1 capsule (5 mg total) by mouth daily.  90 capsule  3  . simvastatin (ZOCOR) 40 MG tablet Take 1 tablet (40 mg total) by mouth at bedtime.  30 tablet  6  . spironolactone (ALDACTONE) 25 MG tablet Restart 0.5 tablets (12.5 mg total) by mouth daily on June 10.      . torsemide (DEMADEX) 20 MG tablet Take 2 tablets (40 mg total) by mouth 2 (two) times daily.  120 tablet  3  . traMADol (ULTRAM) 50 MG tablet Take 1 tablet (50 mg total) by mouth every 8 (eight) hours as needed.  20 tablet  0    Results for orders placed during the hospital  encounter of 03/06/14 (from the past 48 hour(s))  CBC WITH DIFFERENTIAL     Status: Abnormal   Collection Time    03/06/14  2:40 AM      Result Value Ref Range   WBC 7.9  4.0 - 10.5 K/uL   RBC 4.39  4.22 - 5.81 MIL/uL   Hemoglobin 12.9 (*) 13.0 - 17.0 g/dL   HCT 38.6 (*) 39.0 - 52.0 %   MCV 87.9  78.0 - 100.0 fL   MCH 29.4  26.0 - 34.0 pg   MCHC 33.4  30.0 - 36.0 g/dL   RDW 14.7  11.5 - 15.5 %   Platelets 176  150 - 400 K/uL   Neutrophils Relative % 54  43 - 77 %   Neutro Abs 4.2  1.7 - 7.7 K/uL   Lymphocytes Relative 35  12 - 46 %   Lymphs Abs 2.8  0.7 - 4.0 K/uL   Monocytes Relative 9  3 - 12 %   Monocytes Absolute 0.7  0.1 - 1.0 K/uL   Eosinophils Relative 2  0 - 5 %   Eosinophils Absolute 0.1  0.0 - 0.7 K/uL   Basophils Relative 0  0 - 1 %   Basophils Absolute 0.0  0.0 - 0.1 K/uL  BASIC METABOLIC PANEL     Status: Abnormal   Collection Time    03/06/14  2:40 AM      Result Value Ref Range   Sodium 136 (*) 137 - 147 mEq/L   Potassium 4.1  3.7 - 5.3 mEq/L   Chloride 93 (*) 96 - 112 mEq/L   CO2 24  19 - 32 mEq/L   Glucose, Bld 259 (*) 70 - 99 mg/dL   BUN 57 (*) 6 - 23 mg/dL   Creatinine, Ser 2.49 (*) 0.50 - 1.35 mg/dL   Calcium 10.0  8.4 - 10.5 mg/dL   GFR calc non Af Amer 28 (*) >90 mL/min   GFR calc Af Amer 32 (*) >90 mL/min   Comment: (NOTE)     The eGFR has been calculated using the CKD EPI equation.     This calculation has not been validated in all clinical situations.     eGFR's persistently <90 mL/min signify possible Chronic Kidney     Disease.  TROPONIN I  Status: None   Collection Time    03/06/14  2:40 AM      Result Value Ref Range   Troponin I <0.30  <0.30 ng/mL   Comment:            Due to the release kinetics of cTnI,     a negative result within the first hours     of the onset of symptoms does not rule out     myocardial infarction with certainty.     If myocardial infarction is still suspected,     repeat the test at appropriate intervals.    PRO B NATRIURETIC PEPTIDE     Status: Abnormal   Collection Time    03/06/14  2:40 AM      Result Value Ref Range   Pro B Natriuretic peptide (BNP) 9697.0 (*) 0 - 125 pg/mL  MAGNESIUM     Status: None   Collection Time    03/06/14  2:40 AM      Result Value Ref Range   Magnesium 2.2  1.5 - 2.5 mg/dL  DIGOXIN LEVEL     Status: Abnormal   Collection Time    03/06/14  2:40 AM      Result Value Ref Range   Digoxin Level 0.3 (*) 0.8 - 2.0 ng/mL   Dg Chest Port 1 View  03/06/2014   CLINICAL DATA:  Cardiomegaly and mild pulmonary edema.  Left atelectasis.  EXAM: PORTABLE CHEST - 1 VIEW  COMPARISON:  03/06/2013  FINDINGS: Chronic cardiopericardial enlargement. There is a left approach ICD lead which is partly imaged. Visible portions are unchanged. Interstitial coarsening with patchy density in the lower lungs. Bandlike opacity in the left lower lung which is consistent with atelectasis. No visible effusion.  IMPRESSION: 1. Indistinct basilar opacities, favor mild edema over pneumonia. 2. Left-sided atelectasis.   Electronically Signed   By: Jorje Guild M.D.   On: 03/06/2014 03:10    Review of Systems  Constitutional: Positive for malaise/fatigue. Negative for fever and chills.  HENT: Negative for hearing loss.   Eyes: Negative for double vision and photophobia.  Respiratory: Positive for shortness of breath. Negative for sputum production.   Cardiovascular: Positive for orthopnea. Negative for chest pain and palpitations.  Gastrointestinal: Negative for heartburn, nausea, vomiting and blood in stool.  Genitourinary: Negative for dysuria and hematuria.  Musculoskeletal: Negative for myalgias and neck pain.  Skin: Negative for rash.  Neurological: Negative for tingling, tremors, sensory change and headaches.  Endo/Heme/Allergies: Negative for polydipsia. Does not bruise/bleed easily.  Psychiatric/Behavioral: Negative for suicidal ideas, hallucinations and substance abuse.    Blood  pressure 104/72, pulse 98, temperature 97.8 F (36.6 C), temperature source Oral, resp. rate 20, height 5' 5.5" (1.664 m), weight 161.8 kg (356 lb 11.3 oz), SpO2 97.00%. Physical Exam  Nursing note reviewed. Constitutional: He is oriented to person, place, and time. He appears well-developed and well-nourished. No distress.  HENT:  Head: Normocephalic and atraumatic.  Nose: Nose normal.  Mouth/Throat: Oropharynx is clear and moist. No oropharyngeal exudate.  Eyes: Conjunctivae and EOM are normal. Pupils are equal, round, and reactive to light. No scleral icterus.  Neck: Normal range of motion. Neck supple. JVD present. No tracheal deviation present.  Challenging to see - midneck, +HJR  Cardiovascular: Normal heart sounds and intact distal pulses.  Exam reveals no gallop.   No murmur heard. Irregularly irregular  Respiratory: Effort normal. No respiratory distress. He has no wheezes. He has rales.  GI: Soft.  Bowel sounds are normal. He exhibits distension. There is no tenderness.  Musculoskeletal: Normal range of motion. He exhibits no edema and no tenderness.  Neurological: He is alert and oriented to person, place, and time. No cranial nerve deficit. Coordination normal.  Skin: Skin is warm and dry. No rash noted. He is not diaphoretic. No erythema.  Psychiatric: He has a normal mood and affect. His behavior is normal. Thought content normal.    Labs reviewed as above; Cr 2.5, Mg 2.2, K 4.1, Trop <0.3, BNP 9900 ECG: Atrial fibrillation Chest x-ray: pulmonary edema  Problem List Acute on chronic heart failure  Atrial fibrillation with RVR - new onsent HTN, T2DM Anemia Morbid Obesity   Assessment/Plan Gary Hutchinson is a 53 yo man with PMH of T2DM, HTN, morbid obesity, anemia and acute on chronic systolic heart failure who presents with worsening shortness of breath and acute on chronic heart failure exacerbation who is found to have new onset atrial fibrillation as well. 80 mg IV lasix  given in ER and on my examination he was just about to need to urinate. He's been taking more metolazone.  - telemetry - goal net negative 1-2L daily, 80 mg IV lasix give in ER, will increase to 100 mg IV bid, may need 3x daily vs. Continuous gtt - trend cardiac biomarkers - holding ramipril for AKI - holding metformin/glipizide for inpatient status/renal insufficiency  - defer metolazone (dosed twice weekly at home) to IV lasix response assessment and increased creatinine after he's taken more doses over the last few days  - pharmacy for heparin gtt given renal insufficiency in case we pursue RHC or other studies  - interrogate ICD this AM  Jules Husbands 03/06/2014, 5:49 AM

## 2014-03-06 NOTE — ED Notes (Signed)
Pt. States he recently changed around medications per cardiologist. Pt. C/o SOB and abdominal pressure. Pt. Abdomen distended and states "the pressure makes it hard for me to breathe". Reports hx of same and states "they had to drain off the fluid". Alert and oriented x4. Mild diaphoresis. Placed on 2L O2. Denies chest pain.

## 2014-03-06 NOTE — Progress Notes (Signed)
ANTICOAGULATION CONSULT NOTE - Initial Consult  Pharmacy Consult for Heparin Indication: atrial fibrillation  No Known Allergies  Patient Measurements: Height: 5' 5.5" (166.4 cm) Weight: 356 lb 11.3 oz (161.8 kg) IBW/kg (Calculated) : 62.65 Heparin Dosing Weight: 105 kg  Vital Signs: Temp: 97.8 F (36.6 C) (06/01 0526) Temp src: Oral (06/01 0526) BP: 104/72 mmHg (06/01 0526) Pulse Rate: 98 (06/01 0526)  Labs:  Recent Labs  03/06/14 0240  HGB 12.9*  HCT 38.6*  PLT 176  CREATININE 2.49*  TROPONINI <0.30    Estimated Creatinine Clearance: 49.6 ml/min (by C-G formula based on Cr of 2.49).   Medical History: Past Medical History  Diagnosis Date  . Cardiomyopathy     nonischemic  . Ventricular tachycardia     s/p MDT ICD implant  . Alcohol abuse     now quit  . Pulmonary hypertension   . Diverticulosis of colon   . Hemorrhoids, internal   . Cholecystitis   . Dyspepsia   . Morbid obesity   . Obstructive sleep apnea   . Anemia     iron defi  . Hypertension   . Gout   . Diabetes mellitus   . Chronic systolic congestive heart failure   . Asthma     Medications:  Prescriptions prior to admission  Medication Sig Dispense Refill  . acetaminophen (TYLENOL) 325 MG tablet Take 2 tablets (650 mg total) by mouth every 6 (six) hours as needed.      Marland Kitchen. albuterol (PROAIR HFA) 108 (90 BASE) MCG/ACT inhaler Inhale 2 puffs into the lungs every 6 (six) hours as needed. For wheezing or shortness of breath      . allopurinol (ZYLOPRIM) 100 MG tablet Take 1 tablet by mouth daily.      Marland Kitchen. aspirin 81 MG tablet Take 81 mg by mouth daily.      . colchicine 0.6 MG tablet Take 0.6 mg by mouth daily as needed. Gout flare ups      . digoxin (LANOXIN) 0.25 MG tablet Take 0.5 tablets (125 mcg total) by mouth every other day.  15 tablet  6  . ezetimibe (ZETIA) 10 MG tablet Take 10 mg by mouth daily.      . fluticasone (FLONASE) 50 MCG/ACT nasal spray Place 2 sprays into the nose as  needed.       . Fluticasone-Salmeterol (ADVAIR DISKUS) 250-50 MCG/DOSE AEPB Inhale 1 puff into the lungs every 12 (twelve) hours as needed. For wheezing or shortness of breath      . glipizide-metformin (METAGLIP) 2.5-250 MG per tablet Take 1 tablet by mouth 2 (two) times daily before a meal.        . metolazone (ZAROXOLYN) 2.5 MG tablet Take 1 tablet (2.5 mg total) by mouth 2 (two) times a week. Every Tue and Sat  12 tablet  3  . montelukast (SINGULAIR) 10 MG tablet Take 10 mg by mouth daily as needed. For shortness of breath or wheezing      . potassium chloride SA (K-DUR,KLOR-CON) 20 MEQ tablet Take 1 tablet (20 mEq total) by mouth 2 (two) times a week. Every Tue and Sat with Metolazone  12 tablet  3  . ramipril (ALTACE) 5 MG capsule Take 1 capsule (5 mg total) by mouth daily.  90 capsule  3  . simvastatin (ZOCOR) 40 MG tablet Take 1 tablet (40 mg total) by mouth at bedtime.  30 tablet  6  . spironolactone (ALDACTONE) 25 MG tablet Restart 0.5 tablets (12.5 mg  total) by mouth daily on June 10.      . torsemide (DEMADEX) 20 MG tablet Take 2 tablets (40 mg total) by mouth 2 (two) times daily.  120 tablet  3  . traMADol (ULTRAM) 50 MG tablet Take 1 tablet (50 mg total) by mouth every 8 (eight) hours as needed.  20 tablet  0    Assessment: 53 yo male with SOB, new Afib, for heparin  Goal of Therapy:  Heparin level 0.3-0.7 units/ml Monitor platelets by anticoagulation protocol: Yes   Plan:  Heparin 4000 units IV bolus, then 1700 units/hr Check heparin level in 8 hours.   Gary Fleet Kaydan Wilhoite 03/06/2014,6:22 AM

## 2014-03-06 NOTE — Progress Notes (Signed)
ANTICOAGULATION CONSULT NOTE - Initial Consult  Pharmacy Consult for xarelto Indication: atrial fibrillation  No Known Allergies  Patient Measurements: Height: 5' 5.5" (166.4 cm) Weight: 356 lb 11.3 oz (161.8 kg) IBW/kg (Calculated) : 62.65   Vital Signs: Temp: 97.8 F (36.6 C) (06/01 0526) Temp src: Oral (06/01 0526) BP: 104/72 mmHg (06/01 0526) Pulse Rate: 98 (06/01 0526)  Labs:  Recent Labs  03/06/14 0240 03/06/14 0803  HGB 12.9*  --   HCT 38.6*  --   PLT 176  --   CREATININE 2.49*  --   TROPONINI <0.30 <0.30    Estimated Creatinine Clearance: 49.6 ml/min (by C-G formula based on Cr of 2.49).   Medical History: Past Medical History  Diagnosis Date  . Cardiomyopathy     nonischemic  . Ventricular tachycardia     s/p MDT ICD implant  . Alcohol abuse     now quit  . Pulmonary hypertension   . Diverticulosis of colon   . Hemorrhoids, internal   . Cholecystitis   . Dyspepsia   . Morbid obesity   . Obstructive sleep apnea   . Anemia     iron defi  . Hypertension   . Gout   . Diabetes mellitus   . Chronic systolic congestive heart failure   . Asthma    Assessment: 53 year old male with known heart failure being seen by advanced heart failure team now admitted with new onset afib. Started on heparin this morning, will now transition to xarelto with plans for cardioversion if afib not resolved by Wednesday. Using actual body weight patient's crcl is above 67ml/min so will use full dose. Transition off heparin when xarelto given this am.  Goal of Therapy:  Monitor platelets by anticoagulation protocol: Yes   Plan:  Heparin off at noon Xarelto 20mg  daily - first dose this am then daily with breakfast to facilitate earlier cardioversion  Severiano Gilbert 03/06/2014,10:52 AM

## 2014-03-06 NOTE — Care Management Note (Addendum)
    Page 1 of 1   03/13/2014     9:42:56 AM CARE MANAGEMENT NOTE 03/13/2014  Patient:  Gary Hutchinson, Gary Hutchinson   Account Number:  [id number]  Date Initiated:  03/06/2014  Documentation initiated by:  [facility]  Subjective/Objective Assessment:   53 yo man with PMH of chronic systolic heart failure, nonischemic cardiomyopathy, T2DM, hypetension, OSA on CPAP, VT s/p ICD, morbid obesity who presents with worsening SOB.// Home alone.     Action/Plan:   Lasix gtts.//Access for [facility] services   Anticipated DC Date:  03/13/2014   Anticipated DC Plan:  HOME W HOME HEALTH SERVICES      DC Planning Services  CM consult      [facility] Choice  HOME HEALTH   Choice offered to / List presented to:          [facility] arranged  HH-1 RN  HH-10 DISEASE MANAGEMENT      HH agency  [facility]   Status of service:  Completed, signed off Medicare Important Message given?  YES (If response is "NO", the following Medicare IM given date fields will be blank) Date Medicare IM given:  03/06/2014 Date Additional Medicare IM given:  03/13/2014  Discharge Disposition:  HOME W HOME HEALTH SERVICES  Per UR Regulation:  Reviewed for med. necessity/level of care/duration of stay  If discussed at Long Length of Stay Meetings, dates discussed:   03/14/2014    Comments:  6/8 0939 debbie Midori Dado rn,bsn pt for dc home today. went over list of hhc agencies and no pref. ref to [facility] for hhrn. gave pt 30day free xarelto card. pt was given copay of im but in isolation room so did not sign.

## 2014-03-06 NOTE — ED Notes (Signed)
Phoned lab for confirmation of Digoxin add on, lab confirmed.

## 2014-03-06 NOTE — Progress Notes (Signed)
Heart Failure Navigator Consult Note  Presentation: Gary Hutchinson is a 53 yo man with PMH of chronic systolic heart failure, last known EF 25-30%, nonischemic cardiomyopathy, T2DM, hypetension, OSA on CPAP, VT s/p ICD, morbid obesity last seen by Dr. Shirlee Latch mid May 2015 who presents with worsening shortness of breath and abdominal distention/weight gain consistent with acute on chronic heart failure. He's been taking more metolazone at home without improved UOP leading to presentation. No chest pain/pressure, no syncope. +Dry mouth. He feels a fatigued and less energy. Tolerating other medications.   Past Medical History  Diagnosis Date  . Cardiomyopathy     nonischemic  . Ventricular tachycardia     s/p MDT ICD implant  . Alcohol abuse     now quit  . Pulmonary hypertension   . Diverticulosis of colon   . Hemorrhoids, internal   . Cholecystitis   . Dyspepsia   . Morbid obesity   . Obstructive sleep apnea   . Anemia     iron defi  . Hypertension   . Gout   . Diabetes mellitus   . Chronic systolic congestive heart failure   . Asthma     History   Social History  . Marital Status: Single    Spouse Name: N/A    Number of Children: 4  . Years of Education: N/A   Occupational History  .     Social History Main Topics  . Smoking status: Never Smoker   . Smokeless tobacco: None  . Alcohol Use: No     Comment: former heavy ETOH, quit  . Drug Use: No  . Sexual Activity: None   Other Topics Concern  . None   Social History Narrative   disabled    ECHO:Study Conclusions--07/27/13  - Left ventricle: Endocardium not well visualized The cavity size was moderately dilated. Wall thickness was increased in a pattern of mild LVH. The estimated ejection fraction was 40%. Diffuse hypokinesis. - Mitral valve: Mild regurgitation. - Left atrium: The atrium was moderately to severely dilated. - Atrial septum: No defect or patent foramen ovale was identified. Transthoracic  echocardiography. M-mode, complete 2D, spectral Doppler, and color Doppler. Weight: Weight: 158.6kg. Weight: 349lb. Patient status: Outpatient. Location: Echo laboratory.   BNP    Component Value Date/Time   PROBNP 9697.0* 03/06/2014 0240    Education Assessment and Provision:  Detailed education and instructions provided on heart failure disease management including the following:  Signs and symptoms of Heart Failure When to call the physician Importance of daily weights Low sodium diet  Fluid restriction Medication management Anticipated future follow-up appointments  Patient education given on each of the above topics.  Patient acknowledges understanding and acceptance of all instructions.  Gary Hutchinson acknowledges that he has received much of the above teaching before.  He also says that lately he has not been weighing and sometimes eats convenience foods which he knows are not low sodium.  He also admits to having problems with his evening dose of Demadex at home as he sometimes "falls asleep early and wakes up in the early morning" --too late to take 2nd dose.  We discussed trying to take it earlier in the afternoon perhaps.  Education Materials:  "Living Better With Heart Failure" Booklet, Daily Weight Tracker Tool and Heart Failure Educational Video.   High Risk Criteria for Readmission and/or Poor Patient Outcomes:   EF <30%- No 40%  2 or more admissions in 6 months- No  Difficult social situation- No--he  lives with son 53 years old  Demonstrates medication noncompliance- Yes--at times admits difficulties with BID dosing.    Barriers of Care:  Knowledge of HF and compliance  Discharge Planning:   Plans to discharge home with son

## 2014-03-06 NOTE — Progress Notes (Signed)
Pt started on heparin gtt, 4000 unit bolus given and rate is 17 ml/hr

## 2014-03-07 ENCOUNTER — Inpatient Hospital Stay (HOSPITAL_COMMUNITY): Payer: Medicare Other

## 2014-03-07 LAB — BASIC METABOLIC PANEL
BUN: 60 mg/dL — ABNORMAL HIGH (ref 6–23)
BUN: 64 mg/dL — ABNORMAL HIGH (ref 6–23)
CALCIUM: 9.7 mg/dL (ref 8.4–10.5)
CHLORIDE: 91 meq/L — AB (ref 96–112)
CHLORIDE: 89 meq/L — AB (ref 96–112)
CO2: 26 meq/L (ref 19–32)
CO2: 18 mEq/L — ABNORMAL LOW (ref 19–32)
CREATININE: 2.96 mg/dL — AB (ref 0.50–1.35)
Calcium: 9.8 mg/dL (ref 8.4–10.5)
Creatinine, Ser: 2.54 mg/dL — ABNORMAL HIGH (ref 0.50–1.35)
GFR calc Af Amer: 32 mL/min — ABNORMAL LOW (ref >90)
GFR calc Af Amer: 26 mL/min — ABNORMAL LOW (ref >90)
GFR calc non Af Amer: 27 mL/min — ABNORMAL LOW (ref >90)
GFR calc non Af Amer: 23 mL/min — ABNORMAL LOW (ref >90)
GLUCOSE: 295 mg/dL — AB (ref 70–99)
Glucose, Bld: 287 mg/dL — ABNORMAL HIGH (ref 70–99)
POTASSIUM: 5.6 meq/L — AB (ref 3.7–5.3)
Potassium: 4.9 mEq/L (ref 3.7–5.3)
SODIUM: 131 meq/L — AB (ref 137–147)
Sodium: 134 mEq/L — ABNORMAL LOW (ref 137–147)

## 2014-03-07 LAB — GLUCOSE, CAPILLARY
GLUCOSE-CAPILLARY: 320 mg/dL — AB (ref 70–99)
Glucose-Capillary: 262 mg/dL — ABNORMAL HIGH (ref 70–99)
Glucose-Capillary: 195 mg/dL — ABNORMAL HIGH (ref 70–99)
Glucose-Capillary: 220 mg/dL — ABNORMAL HIGH (ref 70–99)

## 2014-03-07 LAB — CARBOXYHEMOGLOBIN
CARBOXYHEMOGLOBIN: 1 % (ref 0.5–1.5)
CARBOXYHEMOGLOBIN: 1.1 % (ref 0.5–1.5)
METHEMOGLOBIN: 1 % (ref 0.0–1.5)
Methemoglobin: 0.9 % (ref 0.0–1.5)
O2 Saturation: 42 %
O2 Saturation: 56.6 %
Total hemoglobin: 13.4 g/dL — ABNORMAL LOW (ref 13.5–18.0)
Total hemoglobin: 13.1 g/dL — ABNORMAL LOW (ref 13.5–18.0)

## 2014-03-07 LAB — CBC
HEMATOCRIT: 38.6 % — AB (ref 39.0–52.0)
Hemoglobin: 12.8 g/dL — ABNORMAL LOW (ref 13.0–17.0)
MCH: 29.2 pg (ref 26.0–34.0)
MCHC: 33.2 g/dL (ref 30.0–36.0)
MCV: 88.1 fL (ref 78.0–100.0)
Platelets: 190 10*3/uL (ref 150–400)
RBC: 4.38 MIL/uL (ref 4.22–5.81)
RDW: 14.8 % (ref 11.5–15.5)
WBC: 9.3 10*3/uL (ref 4.0–10.5)

## 2014-03-07 LAB — HEMOGLOBIN A1C
Hgb A1c MFr Bld: 9.6 % — ABNORMAL HIGH (ref <5.7)
Mean Plasma Glucose: 229 mg/dL — ABNORMAL HIGH (ref <117)

## 2014-03-07 LAB — MRSA PCR SCREENING: MRSA by PCR: POSITIVE — AB

## 2014-03-07 MED ORDER — FENTANYL CITRATE 0.05 MG/ML IJ SOLN
INTRAMUSCULAR | Status: AC
  Administered 2014-03-07: 25 ug
  Filled 2014-03-07: qty 2

## 2014-03-07 MED ORDER — AMIODARONE HCL IN DEXTROSE 360-4.14 MG/200ML-% IV SOLN
30.0000 mg/h | INTRAVENOUS | Status: AC
  Administered 2014-03-08 – 2014-03-09 (×4): 30 mg/h via INTRAVENOUS
  Filled 2014-03-07 (×8): qty 200

## 2014-03-07 MED ORDER — FUROSEMIDE 10 MG/ML IJ SOLN
80.0000 mg | Freq: Three times a day (TID) | INTRAMUSCULAR | Status: DC
  Administered 2014-03-07 – 2014-03-11 (×12): 80 mg via INTRAVENOUS
  Filled 2014-03-07 (×13): qty 8

## 2014-03-07 MED ORDER — MILRINONE IN DEXTROSE 20 MG/100ML IV SOLN
0.1250 ug/kg/min | INTRAVENOUS | Status: DC
  Administered 2014-03-07 – 2014-03-11 (×11): 0.25 ug/kg/min via INTRAVENOUS
  Administered 2014-03-12: 0.125 ug/kg/min via INTRAVENOUS
  Filled 2014-03-07 (×13): qty 100

## 2014-03-07 MED ORDER — FUROSEMIDE 10 MG/ML IJ SOLN
INTRAMUSCULAR | Status: AC
  Filled 2014-03-07: qty 8

## 2014-03-07 MED ORDER — CHLORHEXIDINE GLUCONATE CLOTH 2 % EX PADS
6.0000 | MEDICATED_PAD | Freq: Every day | CUTANEOUS | Status: AC
  Administered 2014-03-08 – 2014-03-12 (×5): 6 via TOPICAL

## 2014-03-07 MED ORDER — MIDAZOLAM HCL 2 MG/2ML IJ SOLN
INTRAMUSCULAR | Status: AC
  Administered 2014-03-07: 1 mg
  Filled 2014-03-07: qty 2

## 2014-03-07 MED ORDER — MUPIROCIN 2 % EX OINT
1.0000 "application " | TOPICAL_OINTMENT | Freq: Two times a day (BID) | CUTANEOUS | Status: AC
  Administered 2014-03-08 – 2014-03-12 (×10): 1 via NASAL
  Filled 2014-03-07: qty 22

## 2014-03-07 MED ORDER — METOLAZONE 5 MG PO TABS
5.0000 mg | ORAL_TABLET | Freq: Once | ORAL | Status: DC
Start: 2014-03-07 — End: 2014-03-08
  Filled 2014-03-07: qty 1

## 2014-03-07 MED ORDER — AMIODARONE HCL 200 MG PO TABS
400.0000 mg | ORAL_TABLET | Freq: Two times a day (BID) | ORAL | Status: DC
  Administered 2014-03-07: 400 mg via ORAL
  Filled 2014-03-07 (×2): qty 2

## 2014-03-07 MED ORDER — AMIODARONE HCL IN DEXTROSE 360-4.14 MG/200ML-% IV SOLN
60.0000 mg/h | INTRAVENOUS | Status: AC
  Administered 2014-03-07: 60 mg/h via INTRAVENOUS
  Filled 2014-03-07: qty 200

## 2014-03-07 NOTE — Progress Notes (Signed)
  Amiodarone Drug - Drug Interaction Consult Note  Recommendations: 1) Monitor K level, signs of myopathy and s/s of bleeding.  2) No other significant drug interactions noted.   Amiodarone is metabolized by the cytochrome P450 system and therefore has the potential to cause many drug interactions. Amiodarone has an average plasma half-life of 50 days (range 20 to 100 days).   There is potential for drug interactions to occur several weeks or months after stopping treatment and the onset of drug interactions may be slow after initiating amiodarone.   [x]  Statins: Increased risk of myopathy. Simvastatin- restrict dose to 20mg  daily. Other statins: counsel patients to report any muscle pain or weakness immediately.  [x]  Anticoagulants: Amiodarone can increase anticoagulant effect. Consider warfarin dose reduction. Patients should be monitored closely and the dose of anticoagulant altered accordingly, remembering that amiodarone levels take several weeks to stabilize.  []  Antiepileptics: Amiodarone can increase plasma concentration of phenytoin, the dose should be reduced. Note that small changes in phenytoin dose can result in large changes in levels. Monitor patient and counsel on signs of toxicity.  []  Beta blockers: increased risk of bradycardia, AV block and myocardial depression. Sotalol - avoid concomitant use.  []   Calcium channel blockers (diltiazem and verapamil): increased risk of bradycardia, AV block and myocardial depression.  []   Cyclosporine: Amiodarone increases levels of cyclosporine. Reduced dose of cyclosporine is recommended.  []  Digoxin dose should be halved when amiodarone is started.  [x]  Diuretics: increased risk of cardiotoxicity if hypokalemia occurs.  []  Oral hypoglycemic agents (glyburide, glipizide, glimepiride): increased risk of hypoglycemia. Patient's glucose levels should be monitored closely when initiating amiodarone therapy.   []  Drugs that prolong the  QT interval:  Torsades de pointes risk may be increased with concurrent use - avoid if possible.  Monitor QTc, also keep magnesium/potassium WNL if concurrent therapy can't be avoided. Marland Kitchen Antibiotics: e.g. fluoroquinolones, erythromycin. . Antiarrhythmics: e.g. quinidine, procainamide, disopyramide, sotalol. . Antipsychotics: e.g. phenothiazines, haloperidol.  . Lithium, tricyclic antidepressants, and methadone.  Thank You,  Vinnie Level, PharmD.  Clinical Pharmacist Pager [phone]

## 2014-03-07 NOTE — Progress Notes (Signed)
1730 Transferred to 2H via bed with O2 transport . Pt fully awakw ,alert and oriented. Son in assistance

## 2014-03-07 NOTE — Progress Notes (Signed)
Reviewed BMET from the afternoon. Renal function worse and he is SOB at rest.   Transfer to stepdown now. Plan to place central to follow CVPs and CO-OX  Dr Gala Romney aware and agrees with plan.   Amy D Clegg NP-C  2:55 PM

## 2014-03-07 NOTE — Progress Notes (Signed)
1657 Report given to Aggie Cosier , [facility] RN

## 2014-03-07 NOTE — Progress Notes (Signed)
Advanced Heart Failure Rounding Note   Subjective:    [name] is a 53 yo man with PMH of chronic systolic heart failure, last known EF 25-30%, nonischemic cardiomyopathy, T2DM, hypetension, OSA on CPAP, VT s/p ICD, morbid obesity last seen by Dr. Shirlee Latch mid May 2015 . Weight at that time was 354 pounds.   He presented to [facility] ED with worsening shortness of breath and abdominal distention/weight gain consistent with acute on chronic heart failure. He started on IV lasix . Beta blocker stopped due to acute decompensation.  Ace stopped due to CKD. CEs negative.   Yesterday he was started on lasix drip and amiodarone drip. He continued on heparin and started Xarelto. Weight down 3 pounds. 24 hour I/O -436 pounds.    Denies SOB.   Creatinine 1.7> 2.4 >2.5  Optivol.   Objective:   Weight Range:  Vital Signs:   Temp:  [97.7 F (36.5 C)-97.9 F (36.6 C)] 97.9 F (36.6 C) (06/02 0559) Pulse Rate:  [50-97] 82 (06/02 0559) Resp:  [18-19] 19 (06/02 0559) BP: (105-146)/(54-92) 105/54 mmHg (06/02 0559) SpO2:  [92 %-97 %] 92 % (06/02 0559) Weight:  [353 lb 13.4 oz (160.5 kg)] 353 lb 13.4 oz (160.5 kg) (06/02 0559) Last BM Date: 03/05/14  Weight change: Filed Weights   03/06/14 0526 03/06/14 0558 03/07/14 0559  Weight: 356 lb 11.3 oz (161.8 kg) 356 lb 11.3 oz (161.8 kg) 353 lb 13.4 oz (160.5 kg)    Intake/Output:   Intake/Output Summary (Last 24 hours) at 03/07/14 0850 Last data filed at 03/07/14 4540  Gross per 24 hour  Intake 1253.3 ml  Output   1690 ml  Net -436.7 ml     Physical Exam: General:  Chronically ill appearing. No resp difficulty Sitting in chair.  HEENT: normal Neck: supple. JVP difficult to assess.  Carotids 2+ bilat; no bruits. No lymphadenopathy or thryomegaly appreciated. Cor: PMI nondisplaced. Irregular rate & rhythm. No rubs, gallops or murmurs. Lungs: clear Abdomen: soft, nontender, +++distended. No hepatosplenomegaly. No bruits or masses. Good bowel  sounds. Extremities: no cyanosis, clubbing, rash, edema R and LLE SCDs on  Neuro: alert & orientedx3, cranial nerves grossly intact. moves all 4 extremities w/o difficulty. Affect pleasant  Telemetry: SR 80-90s  Labs: Basic Metabolic Panel:  Recent Labs Lab 03/06/14 0240 03/07/14 0511  NA 136* 134*  K 4.1 5.6*  CL 93* 91*  CO2 24 26  GLUCOSE 259* 287*  BUN 57* 60*  CREATININE 2.49* 2.54*  CALCIUM 10.0 9.8  MG 2.2  --     Liver Function Tests: No results found for this basename: AST, ALT, ALKPHOS, BILITOT, PROT, ALBUMIN,  in the last 168 hours No results found for this basename: LIPASE, AMYLASE,  in the last 168 hours No results found for this basename: AMMONIA,  in the last 168 hours  CBC:  Recent Labs Lab 03/06/14 0240 03/07/14 0511  WBC 7.9 9.3  NEUTROABS 4.2  --   HGB 12.9* 12.8*  HCT 38.6* 38.6*  MCV 87.9 88.1  PLT 176 190    Cardiac Enzymes:  Recent Labs Lab 03/06/14 0240 03/06/14 0803 03/06/14 1200  TROPONINI <0.30 <0.30 <0.30    BNP: BNP (last 3 results)  Recent Labs  03/06/14 0240  PROBNP 9697.0*     Other results:    Imaging: Dg Chest Port 1 View  03/06/2014   CLINICAL DATA:  Cardiomegaly and mild pulmonary edema.  Left atelectasis.  EXAM: PORTABLE CHEST - 1 VIEW  COMPARISON:  03/06/2013  FINDINGS: Chronic cardiopericardial enlargement. There is a left approach ICD lead which is partly imaged. Visible portions are unchanged. Interstitial coarsening with patchy density in the lower lungs. Bandlike opacity in the left lower lung which is consistent with atelectasis. No visible effusion.  IMPRESSION: 1. Indistinct basilar opacities, favor mild edema over pneumonia. 2. Left-sided atelectasis.   Electronically Signed   By: Tiburcio Pea M.D.   On: 03/06/2014 03:10     Medications:     Scheduled Medications: . allopurinol  100 mg Oral Daily  . aspirin EC  81 mg Oral Daily  . atorvastatin  20 mg Oral q1800  . digoxin  125 mcg Oral  QODAY  . ezetimibe  10 mg Oral Daily  . insulin aspart  0-5 Units Subcutaneous QHS  . insulin aspart  0-9 Units Subcutaneous TID WC  . mometasone-formoterol  2 puff Inhalation BID  . montelukast  10 mg Oral QHS  . potassium chloride SA  20 mEq Oral Daily  . rivaroxaban  20 mg Oral Daily  . sodium chloride  3 mL Intravenous Q12H    Infusions: . amiodarone 30 mg/hr (03/07/14 0144)  . furosemide (LASIX) infusion 10 mg/hr (03/06/14 1900)    PRN Medications: sodium chloride, acetaminophen, albuterol, fluticasone, senna-docusate, sodium chloride, traMADol   Assessment:  1. A/C Systolic Heart Failure EF 40%  2. New onset A fib RVR - converted NSR on amiodarone 3. HTN 4. OSA 5. H/O V Tach S/P Medtronic ICD 6. Obesity  7. Gout  8. Cardiorenal Syndrome 1.7>2.4    Plan/Discussion:    Volume status elevated likely due in part due to dietary noncompliance and new onset A fib.   Renal function trending up. Stop diuretics. IF renal function worse tomorrow will need Milrinone. Hold off on bisoprolol. Continue digoxin ( dig level 0.3 ) No Ace due to elevated creatinine.  Converted to NSR around 2300.  Stop IV amiodarone and transition to amiodarone 400 mg po bid.  Continue Xarelto 20 mg daily.     Consult diabetes coordinator. Glucose out of control. Check Hgb A1C   Consult cardiac rehab.   Length of Stay: 1   Amy D Clegg NP-C  03/07/2014, 8:50 AM  Advanced Heart Failure Team Pager [phone] (M-F; 7a - 4p)  Please contact [facility] for night-coverage after hours (4p -7a ) and weekends on [email or web address]   Patient seen and examined with Tonye Becket, NP. We discussed all aspects of the encounter. I agree with the assessment and plan as stated above.   Patient appears encephalopathic and more dyspneic this am. Poor response to IV lasix. Creatinine climbing. Back in NSR with IV amio (converted around 11 pm with amio),   I am very concerned he has low output state and I spent quite  some time trying to discuss this with him though he was focused on the nurses not bringing him his food quick enough. I think his cognition is compromised due to low output HF. Will recheck BMET this afternoon and if renal function not improving will move to ICU for central access and monitoring of co-ox and CVP.   Bevelyn Buckles Sultana Tierney,MD  Addendum:  He has had continued deterioration throughout the day with increasing dyspnea and lethargy. We discussed the situation with him and brought him to ICU for central access. After discussing with him and his family we place R subclavian central line. Initial CVP ~30 co-ox 42% confirming cardiogenic shock and marked volume overload. Will  start milrinone and IV lasix. D/w his PCP Dr. Timothy Lassousso.  The patient is critically ill with multiple organ systems failure and requires high complexity decision making for assessment and support, frequent evaluation and titration of therapies, application of advanced monitoring technologies and extensive interpretation of multiple databases.   Critical Care Time personally devoted to patient care services described in this note (noty including central line placement) is 45 Minutes.  Bevelyn BucklesDaniel R Candido Flott,MD 6:05 PM

## 2014-03-07 NOTE — Procedures (Signed)
Central Venous Catheter Insertion Procedure Note Gary Hutchinson [id number] Feb 06, 1961  Procedure: Insertion of Central Venous Catheter Indications: Assessment of intravascular volume  Procedure Details Consent: Risks of procedure as well as the alternatives and risks of each were explained to the (patient/caregiver).  Consent for procedure obtained. Time Out: Verified patient identification, verified procedure, site/side was marked, verified correct patient position, special equipment/implants available, medications/allergies/relevent history reviewed, required imaging and test results available.  Performed  Maximum sterile technique was used including antiseptics, cap, gloves, gown, hand hygiene, mask and sheet. Skin prep: Chlorhexidine; local anesthetic administered A antimicrobial bonded/coated triple lumen catheter was placed in the right subclavian vein using the Seldinger technique.  Evaluation Blood flow good Complications: No apparent complications Patient did tolerate procedure well. Chest X-ray ordered to verify placement.  CXR: pending.  Bevelyn Buckles [name] MD 03/07/2014, 6:06 PM

## 2014-03-07 NOTE — Evaluation (Signed)
Physical Therapy Evaluation and Discharge Patient Details Name: Gary Hutchinson MRN: [id number] DOB: 11/18/1960 Today's Date: 03/07/2014   History of Present Illness  Pt is a 53 yo man with PMH of chronic systolic heart failure, last known EF 25-30%, nonischemic cardiomyopathy, T2DM, HTN, VT s/p ICD, morbid obesity.  Pt presents with worsening shortness of breath and abdominal distention/weight gain consistent with acute on chronic heart failure.  Clinical Impression  Patient evaluated by Physical Therapy with no further acute PT needs identified. All education has been completed and the patient has no further questions. At the time of PT eval pt states he does not feel he needs PT, and demonstrated mod I or independence throughout mobility. See below for any follow-up Physial Therapy or equipment needs. PT is signing off. Thank you for this referral.     Follow Up Recommendations No PT follow up    Equipment Recommendations  None recommended by PT    Recommendations for Other Services       Precautions / Restrictions Precautions Precautions: Fall Restrictions Weight Bearing Restrictions: No      Mobility  Bed Mobility               General bed mobility comments: Pt received sitting up in chair  Transfers Overall transfer level: Modified independent Equipment used: None             General transfer comment: Pt able to power-up to full standing without physical assist. No unsteadiness noted.   Ambulation/Gait Ambulation/Gait assistance: Independent Ambulation Distance (Feet): 30 Feet Assistive device: None Gait Pattern/deviations: Wide base of support Gait velocity: Decreased Gait velocity interpretation: Below normal speed for age/gender General Gait Details: Pt willing to ambulate in room but not more than ~30 feet. Pt able to negotiate turns and O2 line without difficulty.   Stairs            Wheelchair Mobility    Modified Rankin (Stroke Patients  Only)       Balance Overall balance assessment: No apparent balance deficits (not formally assessed)                                           Pertinent Vitals/Pain Pt on supplemental O2 throughout session. At rest O2 at 95%, and decreased to 91% after ambulation in room with pt SOB. At rest HR at 92bpm, and after ambulation 107bpm.    Home Living Family/patient expects to be discharged to:: Private residence Living Arrangements: Children Available Help at Discharge: Family;Available 24 hours/day Type of Home: Apartment Home Access: Level entry     Home Layout: One level Home Equipment: Grab bars - toilet;Grab bars - tub/shower (Tub shower)      Prior Function Level of Independence: Independent         Comments: On disability, not working. Still driving.     Hand Dominance   Dominant Hand: Right    Extremity/Trunk Assessment   Upper Extremity Assessment: Overall WFL for tasks assessed           Lower Extremity Assessment: Overall WFL for tasks assessed      Cervical / Trunk Assessment: Normal  Communication   Communication: No difficulties  Cognition Arousal/Alertness: Awake/alert Behavior During Therapy: WFL for tasks assessed/performed Overall Cognitive Status: Within Functional Limits for tasks assessed  General Comments      Exercises        Assessment/Plan    PT Assessment Patent does not need any further PT services  PT Diagnosis     PT Problem List    PT Treatment Interventions     PT Goals (Current goals can be found in the Care Plan section) Acute Rehab PT Goals PT Goal Formulation: No goals set, d/c therapy    Frequency     Barriers to discharge        Co-evaluation               End of Session Equipment Utilized During Treatment: Gait belt;Oxygen Activity Tolerance: Patient limited by fatigue Patient left: in chair;with call bell/phone within reach Nurse  Communication: Mobility status         Time: 8250-5397 PT Time Calculation (min): 20 min   Charges:   PT Evaluation $Initial PT Evaluation Tier I: 1 Procedure PT Treatments $Therapeutic Activity: 8-22 mins   PT G CodesRuthann Cancer 03/07/2014, 12:38 PM  Ruthann Cancer, PT, DPT Acute Rehabilitation Services Pager: [phone]

## 2014-03-07 NOTE — Progress Notes (Signed)
Inpatient Diabetes Program Recommendations  AACE/ADA: New Consensus Statement on Inpatient Glycemic Control (2013)  Target Ranges:  Prepandial:   less than 140 mg/dL      Peak postprandial:   less than 180 mg/dL (1-2 hours)      Critically ill patients:  140 - 180 mg/dL  Results for SEM, SORRELLS (MRN [id number]) as of 03/07/2014 09:22  Ref. Range 03/06/2014 06:23 03/06/2014 14:05 03/06/2014 16:17 03/06/2014 21:08 03/07/2014 06:04  Glucose-Capillary Latest Range: 70-99 mg/dL 920 (H) 100 (H) 712 (H) 256 (H) 262 (H)   Inpatient Diabetes Program Recommendations Insulin - Basal: consider adding Lantus 20 units  Correction (SSI): Increase to RESISTANT scale  HgbA1C: pending  Thank you  Piedad Climes BSN, RN,CDE Inpatient Diabetes Coordinator [phone] (team pager)

## 2014-03-08 DIAGNOSIS — R570 Cardiogenic shock: Secondary | ICD-10-CM

## 2014-03-08 LAB — CBC
HCT: 37.9 % — ABNORMAL LOW (ref 39.0–52.0)
HEMOGLOBIN: 12.9 g/dL — AB (ref 13.0–17.0)
MCH: 29.7 pg (ref 26.0–34.0)
MCHC: 34 g/dL (ref 30.0–36.0)
MCV: 87.3 fL (ref 78.0–100.0)
Platelets: 172 10*3/uL (ref 150–400)
RBC: 4.34 MIL/uL (ref 4.22–5.81)
RDW: 14.7 % (ref 11.5–15.5)
WBC: 9.7 10*3/uL (ref 4.0–10.5)

## 2014-03-08 LAB — CARBOXYHEMOGLOBIN
CARBOXYHEMOGLOBIN: 1.4 % (ref 0.5–1.5)
METHEMOGLOBIN: 1 % (ref 0.0–1.5)
O2 SAT: 66.2 %
TOTAL HEMOGLOBIN: 13 g/dL — AB (ref 13.5–18.0)

## 2014-03-08 LAB — BASIC METABOLIC PANEL
BUN: 62 mg/dL — ABNORMAL HIGH (ref 6–23)
CO2: 29 mEq/L (ref 19–32)
Calcium: 9.6 mg/dL (ref 8.4–10.5)
Chloride: 91 mEq/L — ABNORMAL LOW (ref 96–112)
Creatinine, Ser: 2.61 mg/dL — ABNORMAL HIGH (ref 0.50–1.35)
GFR calc Af Amer: 31 mL/min — ABNORMAL LOW (ref >90)
GFR, EST NON AFRICAN AMERICAN: 26 mL/min — AB (ref >90)
GLUCOSE: 200 mg/dL — AB (ref 70–99)
POTASSIUM: 3.7 meq/L (ref 3.7–5.3)
SODIUM: 137 meq/L (ref 137–147)

## 2014-03-08 LAB — GLUCOSE, CAPILLARY
GLUCOSE-CAPILLARY: 245 mg/dL — AB (ref 70–99)
Glucose-Capillary: 220 mg/dL — ABNORMAL HIGH (ref 70–99)
Glucose-Capillary: 235 mg/dL — ABNORMAL HIGH (ref 70–99)
Glucose-Capillary: 207 mg/dL — ABNORMAL HIGH (ref 70–99)

## 2014-03-08 LAB — HEMOGLOBIN A1C
Hgb A1c MFr Bld: 9.6 % — ABNORMAL HIGH (ref <5.7)
MEAN PLASMA GLUCOSE: 229 mg/dL — AB (ref <117)

## 2014-03-08 MED ORDER — INSULIN DETEMIR 100 UNIT/ML ~~LOC~~ SOLN
15.0000 [IU] | Freq: Every day | SUBCUTANEOUS | Status: DC
  Administered 2014-03-08: 15 [IU] via SUBCUTANEOUS
  Filled 2014-03-08 (×2): qty 0.15

## 2014-03-08 NOTE — Discharge Instructions (Signed)
Information on my medicine - XARELTO (Rivaroxaban)  This medication education was reviewed with me or my healthcare representative as part of my discharge preparation.  The pharmacist that spoke with me during my hospital stay was:  Severiano Gilbert, [facility]  Why was Xarelto prescribed for you? Xarelto was prescribed for you to reduce the risk of a blood clot forming that can cause a stroke if you have a medical condition called atrial fibrillation (a type of irregular heartbeat).  What do you need to know about xarelto ? Take your Xarelto ONCE DAILY at the same time every day with your evening meal. If you have difficulty swallowing the tablet whole, you may crush it and mix in applesauce just prior to taking your dose.  Take Xarelto exactly as prescribed by your doctor and DO NOT stop taking Xarelto without talking to the doctor who prescribed the medication.  Stopping without other stroke prevention medication to take the place of Xarelto may increase your risk of developing a clot that causes a stroke.  Refill your prescription before you run out.  After discharge, you should have regular check-up appointments with your healthcare provider that is prescribing your Xarelto.  In the future your dose may need to be changed if your kidney function or weight changes by a significant amount.  What do you do if you miss a dose? If you are taking Xarelto ONCE DAILY and you miss a dose, take it as soon as you remember on the same day then continue your regularly scheduled once daily regimen the next day. Do not take two doses of Xarelto at the same time or on the same day.   Important Safety Information A possible side effect of Xarelto is bleeding. You should call your healthcare provider right away if you experience any of the following:   Bleeding from an injury or your nose that does not stop.   Unusual colored urine (red or dark brown) or unusual colored stools (red or black).   Unusual bruising for unknown reasons.   A serious fall or if you hit your head (even if there is no bleeding).  Some medicines may interact with Xarelto and might increase your risk of bleeding while on Xarelto. To help avoid this, consult your healthcare provider or pharmacist prior to using any new prescription or non-prescription medications, including herbals, vitamins, non-steroidal anti-inflammatory drugs (NSAIDs) and supplements.  This website has more information on Xarelto: VisitDestination.com.br.

## 2014-03-08 NOTE — Progress Notes (Signed)
Advanced Heart Failure Rounding Note   Subjective:    [name] is a 53 yo man with PMH of chronic systolic heart failure, last known EF 25-30%, nonischemic cardiomyopathy, T2DM, hypetension, OSA on CPAP, VT s/p ICD, morbid obesity last seen by Dr. Shirlee LatchMcLean mid May 2015 . Weight at that time was 354 pounds.   [facility] with worsening shortness of breath and abdominal distention/weight gain consistent with acute on chronic heart failure. He started on IV lasix . Beta blocker stopped due to acute decompensation.  Ace stopped due to CKD. CEs negative.   Yesterday he developed dyspnea at rest and had worsening creatinine. He was transferred to ICU with central line placed. CO-OX was 42% and he was started on Milrinone 0.25 mcg. 24 hour I/O -1.2 liters. Weight down 4 pounds.   Denies SOB. + Orthopnea  Creatinine 1.7> 2.4 >2.5 >2.9 >2.6 CO-OX 42%>55>66% on Milrinone 0.25 mcg   Objective:   Weight Range:  Vital Signs:   Temp:  [97.1 F (36.2 C)-97.6 F (36.4 C)] 97.5 F (36.4 C) (06/03 0300) Pulse Rate:  [80-100] 88 (06/03 0600) Resp:  [18-35] 18 (06/03 0600) BP: (114-156)/(55-121) 114/81 mmHg (06/03 0600) SpO2:  [87 %-100 %] 97 % (06/03 0600) Weight:  [350 lb 8.5 oz (159 kg)-354 lb 15.1 oz (161 kg)] 350 lb 8.5 oz (159 kg) (06/03 0348) Last BM Date: 03/05/14  Weight change: Filed Weights   03/07/14 0559 03/07/14 1800 03/08/14 0348  Weight: 353 lb 13.4 oz (160.5 kg) 354 lb 15.1 oz (161 kg) 350 lb 8.5 oz (159 kg)    Intake/Output:   Intake/Output Summary (Last 24 hours) at 03/08/14 0713 Last data filed at 03/08/14 0600  Gross per 24 hour  Intake  647.4 ml  Output   2350 ml  Net -1702.6 ml     Physical Exam: CVP 11 General:  Chronically ill appearing. No resp difficulty. Sitting in bed HEENT: normal Neck: supple. JVP difficult to assess.  Carotids 2+ bilat; no bruits. No lymphadenopathy or thryomegaly appreciated. R central line Cor: PMI nondisplaced.  Irregular rate & rhythm. No rubs, gallops or murmurs. Lungs: clear Abdomen: soft, nontender, +++distended. No hepatosplenomegaly. No bruits or masses. Good bowel sounds. Extremities: no cyanosis, clubbing, rash, edema R and LLE SCDs  Neuro: alert & orientedx3, cranial nerves grossly intact. moves all 4 extremities w/o difficulty. Affect pleasant  Telemetry: SR 80-90s  Labs: Basic Metabolic Panel:  Recent Labs Lab 03/06/14 0240 03/07/14 0511 03/07/14 1248 03/08/14 0435  NA 136* 134* 131* 137  K 4.1 5.6* 4.9 3.7  CL 93* 91* 89* 91*  CO2 24 26 18* 29  GLUCOSE 259* 287* 295* 200*  BUN 57* 60* 64* 62*  CREATININE 2.49* 2.54* 2.96* 2.61*  CALCIUM 10.0 9.8 9.7 9.6  MG 2.2  --   --   --     Liver Function Tests: No results found for this basename: AST, ALT, ALKPHOS, BILITOT, PROT, ALBUMIN,  in the last 168 hours No results found for this basename: LIPASE, AMYLASE,  in the last 168 hours No results found for this basename: AMMONIA,  in the last 168 hours  CBC:  Recent Labs Lab 03/06/14 0240 03/07/14 0511 03/08/14 0435  WBC 7.9 9.3 9.7  NEUTROABS 4.2  --   --   HGB 12.9* 12.8* 12.9*  HCT 38.6* 38.6* 37.9*  MCV 87.9 88.1 87.3  PLT 176 190 172    Cardiac Enzymes:  Recent Labs Lab 03/06/14 0240  03/06/14 0803 03/06/14 1200  TROPONINI <0.30 <0.30 <0.30    BNP: BNP (last 3 results)  Recent Labs  03/06/14 0240  PROBNP 9697.0*     Other results:    Imaging: Dg Chest Port 1 View  03/07/2014   CLINICAL DATA:  Central venous catheter placement  EXAM: PORTABLE CHEST - 1 VIEW  COMPARISON:  03/06/2014  FINDINGS: Right subclavian central venous catheter placed. Tip is in the lower SVC. No pneumothorax. Cardiomegaly unchanged. Pulmonary edema is worse. Left subclavian pacemaker device is partially imaged but not significantly changed.  IMPRESSION: Right subclavian vein central venous catheter placed with its tip in the lower SVC and no pneumothorax.  Worsening pulmonary  edema.   Electronically Signed   By: Maryclare Bean M.D.   On: 03/07/2014 18:32     Medications:     Scheduled Medications: . allopurinol  100 mg Oral Daily  . aspirin EC  81 mg Oral Daily  . atorvastatin  20 mg Oral q1800  . Chlorhexidine Gluconate Cloth  6 each Topical Q0600  . ezetimibe  10 mg Oral Daily  . furosemide  80 mg Intravenous TID  . insulin aspart  0-5 Units Subcutaneous QHS  . insulin aspart  0-9 Units Subcutaneous TID WC  . metolazone  5 mg Oral Once  . mometasone-formoterol  2 puff Inhalation BID  . montelukast  10 mg Oral QHS  . mupirocin ointment  1 application Nasal BID  . rivaroxaban  20 mg Oral Daily  . sodium chloride  3 mL Intravenous Q12H    Infusions: . amiodarone 30 mg/hr (03/08/14 0015)  . milrinone 0.25 mcg/kg/min (03/08/14 0009)    PRN Medications: sodium chloride, acetaminophen, albuterol, fluticasone, senna-docusate, sodium chloride, traMADol   Assessment:  1. A/C Systolic Heart Failure EF 40%  2. New onset A fib RVR - converted NSR on amiodarone 3. HTN 4. OSA 5. H/O V Tach S/P Medtronic ICD 6. Obesity  7. Gout  8. Cardiorenal Syndrome 1.7>2.4>2.9     Plan/Discussion:    CO-OX up to 66%. Continue Milrinone 0.25 mcg. Renal function improving. Volume status improving. Continue 80 mg lasix tid. Keep bb and dig. No Ace due to elevated creatinine.  Remains in NSR. Continue amiodarone drip for now while Milrinone. Continue Xarelto 20 mg daily.     Consult diabetes coordinator. Glucose out of control. Check Hgb A1C   Consult cardiac rehab.   Transfer to stepdown.  Length of Stay: 2  Amy D Clegg NP-C  03/08/2014, 7:13 AM  Advanced Heart Failure Team Pager [phone] (M-F; 7a - 4p)  Please contact [facility] for night-coverage after hours (4p -7a ) and weekends on [email or web address]  Patient seen and examined with Tonye Becket, NP. We discussed all aspects of the encounter. I agree with the assessment and plan as stated above.   Much improved  on IV milrinone. Continue diuresis with milrinone support. Maintaining NSR on amio and anticoagulated with Xarelto. We discussed the fact that he may need home inotropes if we are unable to wean prior to d/c.   Bevelyn Buckles [name],MD 5:48 PM

## 2014-03-08 NOTE — Progress Notes (Signed)
Pt refused ambulation. Sts he is waiting on his lunch, wants to eat first. Declined ed at this time as he has heard it many times. Will f/u. Ethelda Chick CES, ACSM 2:58 PM 03/08/2014

## 2014-03-09 DIAGNOSIS — I059 Rheumatic mitral valve disease, unspecified: Secondary | ICD-10-CM

## 2014-03-09 LAB — BASIC METABOLIC PANEL
BUN: 46 mg/dL — ABNORMAL HIGH (ref 6–23)
CO2: 35 mEq/L — ABNORMAL HIGH (ref 19–32)
Calcium: 9.4 mg/dL (ref 8.4–10.5)
Chloride: 90 mEq/L — ABNORMAL LOW (ref 96–112)
Creatinine, Ser: 1.79 mg/dL — ABNORMAL HIGH (ref 0.50–1.35)
GFR calc Af Amer: 48 mL/min — ABNORMAL LOW (ref >90)
GFR, EST NON AFRICAN AMERICAN: 42 mL/min — AB (ref >90)
Glucose, Bld: 249 mg/dL — ABNORMAL HIGH (ref 70–99)
Potassium: 3.1 mEq/L — ABNORMAL LOW (ref 3.7–5.3)
Sodium: 138 mEq/L (ref 137–147)

## 2014-03-09 LAB — CARBOXYHEMOGLOBIN
Carboxyhemoglobin: 1.6 % — ABNORMAL HIGH (ref 0.5–1.5)
Methemoglobin: 0.8 % (ref 0.0–1.5)
O2 Saturation: 66.9 %
TOTAL HEMOGLOBIN: 13.3 g/dL — AB (ref 13.5–18.0)

## 2014-03-09 LAB — CBC
HCT: 37.5 % — ABNORMAL LOW (ref 39.0–52.0)
Hemoglobin: 12.4 g/dL — ABNORMAL LOW (ref 13.0–17.0)
MCH: 29.6 pg (ref 26.0–34.0)
MCHC: 33.1 g/dL (ref 30.0–36.0)
MCV: 89.5 fL (ref 78.0–100.0)
Platelets: 158 10*3/uL (ref 150–400)
RBC: 4.19 MIL/uL — ABNORMAL LOW (ref 4.22–5.81)
RDW: 15.2 % (ref 11.5–15.5)
WBC: 7.4 10*3/uL (ref 4.0–10.5)

## 2014-03-09 LAB — GLUCOSE, CAPILLARY
GLUCOSE-CAPILLARY: 248 mg/dL — AB (ref 70–99)
GLUCOSE-CAPILLARY: 251 mg/dL — AB (ref 70–99)
GLUCOSE-CAPILLARY: 265 mg/dL — AB (ref 70–99)
GLUCOSE-CAPILLARY: 188 mg/dL — AB (ref 70–99)

## 2014-03-09 MED ORDER — AMIODARONE HCL 200 MG PO TABS
200.0000 mg | ORAL_TABLET | Freq: Two times a day (BID) | ORAL | Status: DC
  Administered 2014-03-09 – 2014-03-13 (×9): 200 mg via ORAL
  Filled 2014-03-09 (×10): qty 1

## 2014-03-09 MED ORDER — LINAGLIPTIN 5 MG PO TABS
5.0000 mg | ORAL_TABLET | Freq: Every day | ORAL | Status: DC
  Administered 2014-03-09 – 2014-03-13 (×5): 5 mg via ORAL
  Filled 2014-03-09 (×5): qty 1

## 2014-03-09 MED ORDER — POTASSIUM CHLORIDE CRYS ER 20 MEQ PO TBCR
40.0000 meq | EXTENDED_RELEASE_TABLET | Freq: Once | ORAL | Status: AC
  Administered 2014-03-09: 40 meq via ORAL
  Filled 2014-03-09: qty 2

## 2014-03-09 MED ORDER — PERFLUTREN LIPID MICROSPHERE
INTRAVENOUS | Status: AC
  Administered 2014-03-09: 5 mL via INTRAVENOUS
  Filled 2014-03-09: qty 10

## 2014-03-09 MED ORDER — INSULIN DETEMIR 100 UNIT/ML ~~LOC~~ SOLN
20.0000 [IU] | Freq: Every day | SUBCUTANEOUS | Status: DC
  Administered 2014-03-09: 20 [IU] via SUBCUTANEOUS
  Filled 2014-03-09 (×3): qty 0.2

## 2014-03-09 MED ORDER — PERFLUTREN LIPID MICROSPHERE
1.0000 mL | INTRAVENOUS | Status: AC | PRN
  Administered 2014-03-09: 5 mL via INTRAVENOUS
  Filled 2014-03-09: qty 10

## 2014-03-09 MED ORDER — ISOSORBIDE MONONITRATE ER 30 MG PO TB24
30.0000 mg | ORAL_TABLET | Freq: Every day | ORAL | Status: DC
  Administered 2014-03-09 – 2014-03-11 (×3): 30 mg via ORAL
  Filled 2014-03-09 (×3): qty 1

## 2014-03-09 MED ORDER — HYDRALAZINE HCL 25 MG PO TABS
12.5000 mg | ORAL_TABLET | Freq: Three times a day (TID) | ORAL | Status: DC
  Administered 2014-03-09 – 2014-03-10 (×4): 12.5 mg via ORAL
  Filled 2014-03-09 (×7): qty 0.5

## 2014-03-09 MED ORDER — DIGOXIN 0.0625 MG HALF TABLET
0.0625 mg | ORAL_TABLET | Freq: Every day | ORAL | Status: DC
  Administered 2014-03-09 – 2014-03-13 (×5): 0.0625 mg via ORAL
  Filled 2014-03-09 (×5): qty 1

## 2014-03-09 NOTE — Progress Notes (Signed)
Patient resting comfortably on home CPAP unit.  

## 2014-03-09 NOTE — Progress Notes (Signed)
Echocardiogram 2D Echocardiogram has been performed.  Genene Churn Nohlan Burdin 03/09/2014, 10:19 AM

## 2014-03-09 NOTE — Progress Notes (Signed)
Educated pt of the importance of not getting up without assistance and why keeping the EKG leads is place is imperative to pt's monitoring.  Pt continues to get up and move around, dislodging leads and tangling wires and lines.  Requested that pt contact RN or RN assistant for assistance anytime he needs to move or if the lines become taught or tangled - pt verbalizes understanding.    Discussed with pt if placing him on tele box would be more beneficial and less stressful - pt agrees and tele box placed.  Reinforced leads with tape, again educating pt the importance of monitoring heart rhythm while on this unit.  Pt verbalizes understanding and has no questions or concerns at this time.  Pt remains up in the chair, no c/o pain or discomfort, talking on the telephone and watching tv.    Will continue to closely monitor.

## 2014-03-09 NOTE — Progress Notes (Signed)
Inpatient Diabetes Program Recommendations  AACE/ADA: New Consensus Statement on Inpatient Glycemic Control (2013)  Target Ranges:  Prepandial:   less than 140 mg/dL      Peak postprandial:   less than 180 mg/dL (1-2 hours)      Critically ill patients:  140 - 180 mg/dL   Levemir increased to 20 units.  Would also recommend increasing Novolog correction scale.  As Levemir is titrated up, Novolog correction can be titrated down. A prandial dose of Novolog may also be needed.   Thank you  Piedad Climes BSN, RN,CDE Inpatient Diabetes Coordinator [phone] (team pager)

## 2014-03-09 NOTE — Progress Notes (Signed)
I have been following DM2 and poor CBGs. I Increased Levimir to 20 and restarted Tradjenta. Cannot go back on Metformin with kidney issues. Glipizide also on hold with Renal issues. Will continue to follow.

## 2014-03-09 NOTE — Progress Notes (Signed)
CARDIAC REHAB PHASE I   PRE:  Rate/Rhythm: 86 SR  BP:  Supine:   Sitting: 116/79  Standing:    SaO2: 97 2L  MODE:  Ambulation: 390 ft   POST:  Rate/Rhythm: 133 Afib  BP:  Supine:   Sitting: 145/80  Standing:    SaO2: 93 2L 1420-1500 Assisted X 1 used O2 2L and w/c for pt to push. He was able to walk 390 feet, c/o of DOE and being tired by end of walk. Pt back to recliner after walk with call light in reach. HR after walk 133 Afib then after rest HR 90's SR.  Melina Copa RN 03/09/2014 3:09 PM

## 2014-03-09 NOTE — Progress Notes (Signed)
Advanced Heart Failure Rounding Note   Subjective:    [name] is a 53 yo man with PMH of chronic systolic heart failure, last known EF 25-30%, nonischemic cardiomyopathy, T2DM, hypetension, OSA on CPAP, VT s/p ICD, morbid obesity last seen by Dr. Shirlee LatchMcLean mid May 2015 . Weight at that time was 354 pounds.   [facility] with worsening shortness of breath and abdominal distention/weight gain consistent with acute on chronic heart failure. He started on IV lasix . Beta blocker stopped due to acute decompensation.  Ace stopped due to CKD. CEs negative.   Yesterday he developed dyspnea at rest and had worsening creatinine. He was transferred to ICU with central line placed. CO-OX was 42% and he was started on Milrinone 0.25 mcg. CO-PX improved on Milrinone to 55%. 24 hour I/O -1.8 liters. Weight down 5 pounds.   Denies SOB.  Creatinine 1.7> 2.4 >2.5 >2.9 >2.6>1.7 CO-OX 42%>55>66>66% on Milrinone 0.25 mcg   Objective:   Weight Range:  Vital Signs:   Temp:  [97.8 F (36.6 C)-98.8 F (37.1 C)] 97.9 F (36.6 C) (06/04 0318) Pulse Rate:  [88-106] 92 (06/04 0318) Resp:  [14-32] 18 (06/04 0318) BP: (118-150)/(51-130) 132/89 mmHg (06/04 0318) SpO2:  [92 %-100 %] 96 % (06/04 0318) Weight:  [345 lb 7.4 oz (156.7 kg)] 345 lb 7.4 oz (156.7 kg) (06/04 0318) Last BM Date: 03/07/14  Weight change: Filed Weights   03/07/14 1800 03/08/14 0348 03/09/14 0318  Weight: 354 lb 15.1 oz (161 kg) 350 lb 8.5 oz (159 kg) 345 lb 7.4 oz (156.7 kg)    Intake/Output:   Intake/Output Summary (Last 24 hours) at 03/09/14 0652 Last data filed at 03/09/14 0600  Gross per 24 hour  Intake 1411.8 ml  Output   3250 ml  Net -1838.2 ml     Physical Exam: CVP 11 General:  Chronically ill appearing. No resp difficulty. Lying flat in the bed.  HEENT: normal Neck: supple. JVP difficult to assess.  Carotids 2+ bilat; no bruits. No lymphadenopathy or thryomegaly appreciated. R central line Cor: PMI  nondisplaced. Irregular rate & rhythm. No rubs, gallops or murmurs. Lungs: clear Abdomen: obese, soft, nontender, +++distended. No hepatosplenomegaly. No bruits or masses. Good bowel sounds. Extremities: no cyanosis, clubbing, rash, edema R and LLE SCDs  Neuro: alert & orientedx3, cranial nerves grossly intact. moves all 4 extremities w/o difficulty. Affect pleasant  Telemetry: SR 80-90s  Labs: Basic Metabolic Panel:  Recent Labs Lab 03/06/14 0240 03/07/14 0511 03/07/14 1248 03/08/14 0435 03/09/14 0430  NA 136* 134* 131* 137 138  K 4.1 5.6* 4.9 3.7 3.1*  CL 93* 91* 89* 91* 90*  CO2 24 26 18* 29 35*  GLUCOSE 259* 287* 295* 200* 249*  BUN 57* 60* 64* 62* 46*  CREATININE 2.49* 2.54* 2.96* 2.61* 1.79*  CALCIUM 10.0 9.8 9.7 9.6 9.4  MG 2.2  --   --   --   --     Liver Function Tests: No results found for this basename: AST, ALT, ALKPHOS, BILITOT, PROT, ALBUMIN,  in the last 168 hours No results found for this basename: LIPASE, AMYLASE,  in the last 168 hours No results found for this basename: AMMONIA,  in the last 168 hours  CBC:  Recent Labs Lab 03/06/14 0240 03/07/14 0511 03/08/14 0435 03/09/14 0430  WBC 7.9 9.3 9.7 7.4  NEUTROABS 4.2  --   --   --   HGB 12.9* 12.8* 12.9* 12.4*  HCT 38.6* 38.6*  37.9* 37.5*  MCV 87.9 88.1 87.3 89.5  PLT 176 190 172 158    Cardiac Enzymes:  Recent Labs Lab 03/06/14 0240 03/06/14 0803 03/06/14 1200  TROPONINI <0.30 <0.30 <0.30    BNP: BNP (last 3 results)  Recent Labs  03/06/14 0240  PROBNP 9697.0*     Other results:    Imaging: Dg Chest Port 1 View  03/07/2014   CLINICAL DATA:  Central venous catheter placement  EXAM: PORTABLE CHEST - 1 VIEW  COMPARISON:  03/06/2014  FINDINGS: Right subclavian central venous catheter placed. Tip is in the lower SVC. No pneumothorax. Cardiomegaly unchanged. Pulmonary edema is worse. Left subclavian pacemaker device is partially imaged but not significantly changed.  IMPRESSION:  Right subclavian vein central venous catheter placed with its tip in the lower SVC and no pneumothorax.  Worsening pulmonary edema.   Electronically Signed   By: Maryclare Bean M.D.   On: 03/07/2014 18:32     Medications:     Scheduled Medications: . allopurinol  100 mg Oral Daily  . aspirin EC  81 mg Oral Daily  . atorvastatin  20 mg Oral q1800  . Chlorhexidine Gluconate Cloth  6 each Topical Q0600  . ezetimibe  10 mg Oral Daily  . furosemide  80 mg Intravenous TID  . insulin aspart  0-5 Units Subcutaneous QHS  . insulin aspart  0-9 Units Subcutaneous TID WC  . insulin detemir  15 Units Subcutaneous Daily  . mometasone-formoterol  2 puff Inhalation BID  . montelukast  10 mg Oral QHS  . mupirocin ointment  1 application Nasal BID  . rivaroxaban  20 mg Oral Daily  . sodium chloride  3 mL Intravenous Q12H    Infusions: . amiodarone 30 mg/hr (03/08/14 2246)  . milrinone 0.25 mcg/kg/min (03/09/14 0204)    PRN Medications: sodium chloride, acetaminophen, albuterol, fluticasone, senna-docusate, sodium chloride, traMADol   Assessment:  1. A/C Systolic Heart Failure EF 40%  2. New onset A fib RVR - converted NSR on amiodarone 3. HTN 4. OSA 5. H/O V Tach S/P Medtronic ICD 6. Obesity  7. Gout  8. Cardiorenal Syndrome 1.7>2.4>2.9     Plan/Discussion:    CO-OX up to 66%. Continue Milrinone 0.25 mcg. Renal function trending down. Volume status improving. Continue 80 mg lasix tid. Keep bb off and dig. No Ace due to elevated creatinine. Add hydralazine 12.5 mg tid and Imdur 30 mg daily. Check ECHO   Remains in NSR. Continue amiodarone drip for now while on  Milrinone. Continue Xarelto 20 mg daily.     HGB A1C 9.6 . Yesterday started on insulin detemir 15 units daily. He will need close follow up with Dr [name] when discharged for DM management.    Consult cardiac rehab. Needs ambulate today.   Transfer to stepdown.  Length of Stay: 3  Amy D Clegg NP-C  03/09/2014, 6:52  AM  Advanced Heart Failure Team Pager [phone] (M-F; 7a - 4p)  Please contact [facility] for night-coverage after hours (4p -7a ) and weekends on [email or web address]  Patient seen and examined with Tonye Becket, NP. We discussed all aspects of the encounter. I agree with the assessment and plan as stated above.   Much improved on IV milrinone. Renal function better.  CVP remains elevated. Continue diuresis with milrinone support. Maintaining NSR on amio and anticoagulated with Xarelto. We discussed the fact that he may need home inotropes if we are unable to wean prior to d/c. I also informed him that  the prognosis for patients with inotrope dependent HF is a 50% mortality at 4-6 months. This hit him hard and he seemed more engaged to try to participate in his care. Stressed need to work with cardiac rehab today. Will not start weaning milrinone until fully diuresed.   Bevelyn Buckles [name],MD 6:52 PM

## 2014-03-10 LAB — CBC
HCT: 38 % — ABNORMAL LOW (ref 39.0–52.0)
HEMOGLOBIN: 12 g/dL — AB (ref 13.0–17.0)
MCH: 28.8 pg (ref 26.0–34.0)
MCHC: 31.6 g/dL (ref 30.0–36.0)
MCV: 91.1 fL (ref 78.0–100.0)
Platelets: 124 10*3/uL — ABNORMAL LOW (ref 150–400)
RBC: 4.17 MIL/uL — ABNORMAL LOW (ref 4.22–5.81)
RDW: 15.5 % (ref 11.5–15.5)
WBC: 5.6 10*3/uL (ref 4.0–10.5)

## 2014-03-10 LAB — BASIC METABOLIC PANEL
BUN: 37 mg/dL — ABNORMAL HIGH (ref 6–23)
CALCIUM: 9.2 mg/dL (ref 8.4–10.5)
CO2: 36 mEq/L — ABNORMAL HIGH (ref 19–32)
Chloride: 92 mEq/L — ABNORMAL LOW (ref 96–112)
Creatinine, Ser: 1.65 mg/dL — ABNORMAL HIGH (ref 0.50–1.35)
GFR, EST AFRICAN AMERICAN: 53 mL/min — AB (ref >90)
GFR, EST NON AFRICAN AMERICAN: 46 mL/min — AB (ref >90)
Glucose, Bld: 211 mg/dL — ABNORMAL HIGH (ref 70–99)
Potassium: 3.1 mEq/L — ABNORMAL LOW (ref 3.7–5.3)
SODIUM: 137 meq/L (ref 137–147)

## 2014-03-10 LAB — GLUCOSE, CAPILLARY
Glucose-Capillary: 246 mg/dL — ABNORMAL HIGH (ref 70–99)
Glucose-Capillary: 265 mg/dL — ABNORMAL HIGH (ref 70–99)
Glucose-Capillary: 179 mg/dL — ABNORMAL HIGH (ref 70–99)

## 2014-03-10 LAB — CARBOXYHEMOGLOBIN
Carboxyhemoglobin: 1.8 % — ABNORMAL HIGH (ref 0.5–1.5)
METHEMOGLOBIN: 0.9 % (ref 0.0–1.5)
O2 SAT: 73 %
TOTAL HEMOGLOBIN: 12.1 g/dL — AB (ref 13.5–18.0)

## 2014-03-10 MED ORDER — POTASSIUM CHLORIDE CRYS ER 20 MEQ PO TBCR
40.0000 meq | EXTENDED_RELEASE_TABLET | Freq: Once | ORAL | Status: AC
  Administered 2014-03-10: 40 meq via ORAL
  Filled 2014-03-10: qty 2

## 2014-03-10 MED ORDER — INSULIN STARTER KIT- PEN NEEDLES (ENGLISH)
1.0000 | Freq: Once | Status: AC
  Administered 2014-03-10: 1
  Filled 2014-03-10: qty 1

## 2014-03-10 MED ORDER — INSULIN DETEMIR 100 UNIT/ML ~~LOC~~ SOLN
24.0000 [IU] | Freq: Every day | SUBCUTANEOUS | Status: DC
  Administered 2014-03-10 – 2014-03-13 (×4): 24 [IU] via SUBCUTANEOUS
  Filled 2014-03-10 (×4): qty 0.24

## 2014-03-10 MED ORDER — HYDRALAZINE HCL 25 MG PO TABS
25.0000 mg | ORAL_TABLET | Freq: Three times a day (TID) | ORAL | Status: DC
  Administered 2014-03-10 – 2014-03-11 (×3): 25 mg via ORAL
  Filled 2014-03-10 (×6): qty 1

## 2014-03-10 MED ORDER — POTASSIUM CHLORIDE CRYS ER 20 MEQ PO TBCR: 40.0000 meq | EXTENDED_RELEASE_TABLET | Freq: Once | ORAL | Status: DC

## 2014-03-10 NOTE — Progress Notes (Signed)
Spoke with patient about diabetes and home regimen for diabetes control. Patient reports that he is followed by his PCP for diabetes management and currently he takes MetaGlip 2.5-250 mg BID as an outpatient for diabetes control. Patient reports that his PCP gave him samples of Tradjenta about 1 month ago and he states that he did take if but his glucose seemed to be increasing instead of decreasing so he stopped taking it after a week or so. Patient reports that he was taking MetaGlip as ordered BID. Patient states he checks his blood sugar a few times a day and over the past month it has been running higher than usual for him. Inquired about knowledge about A1C and patient reports that he does know what an A1C is but is unsure of his last A1C value. Discussed A1C results (9.6% on 6/3//15) and explained basic pathophysiology of DM Type 2, basic home care, importance of checking CBGs and maintaining good CBG control to prevent long-term and short-term complications. Discussed impact of nutrition, exercise, stress, sickness, and medications on diabetes control.    In talking with the patient he reports that he was on insulin pens at one time but eventually stopped it and just used orals for diabetes control. Discussed basal and bolus insulin (specially Levemir and Novolog as used as an inpatient). Educated patient and on insulin pen use at home. Reviewed contents of insulin flexpen starter kit. Reviewed all steps of insulin pen including attachment of needle, 2-unit air shot, dialing up dose, giving injection, removing needle, disposal of sharps, storage of unused insulin, disposal of insulin etc. Patient able to provide successful return demonstration.  MD to give patient Rxs for insulin pens and insulin pen needles. Encouraged patient to check blood sugar 3-4 times per day and keep a log to take with him to his follow up PCP appointment. Patient verbalized understanding of information discussed and he states that  he has no further questions at this time related to diabetes.   Thanks, Barnie Alderman, RN, MSN, CCRN Diabetes Coordinator Inpatient Diabetes Program [phone] (Team Pager) [phone] ([facility] office) [phone] [facility] office)

## 2014-03-10 NOTE — Progress Notes (Signed)
Patient ID: Gary Hutchinson, male   DOB: 07/02/1961, 53 y.o.   MRN: [id number] Advanced Heart Failure Rounding Note   Subjective:    Gary Hutchinson is a 53 yo man with PMH of chronic systolic heart failure, last known EF 25-30%, nonischemic cardiomyopathy, T2DM, hypetension, OSA on CPAP, VT s/p ICD, morbid obesity last seen by Dr. Shirlee LatchMcLean mid May 2015 . Weight at that time was 354 pounds.   [facility] with worsening shortness of breath and abdominal distention/weight gain consistent with acute on chronic heart failure. He started on IV lasix. Beta blocker stopped due to acute decompensation.  Ace stopped due to CKD. CEs negative.  He was noted to be in new-onset atrial fibrillation this admission and was started on amiodarone, with conversion back to NSR.   He is now on milrinone gtt and IV Lasix.  Weight is falling, I/O net negative 1892 cc.   Denies SOB.  Creatinine 1.7> 2.4 >2.5 >2.9 >2.6>1.7>1.65 CO-OX 42%>55>66>66%>73% on Milrinone 0.25 mcg   Objective:   Weight Range:  Vital Signs:   Temp:  [98 F (36.7 C)-98.5 F (36.9 C)] 98.1 F (36.7 C) (06/05 0341) Pulse Rate:  [37-120] 37 (06/05 0400) Resp:  [18-20] 20 (06/05 0400) BP: (96-145)/(31-104) 129/78 mmHg (06/05 0640) SpO2:  [92 %-100 %] 99 % (06/05 0400) Weight:  [344 lb (156.037 kg)] 344 lb (156.037 kg) (06/05 0639) Last BM Date: 03/07/14  Weight change: Filed Weights   03/08/14 0348 03/09/14 0318 03/10/14 0639  Weight: 350 lb 8.5 oz (159 kg) 345 lb 7.4 oz (156.7 kg) 344 lb (156.037 kg)    Intake/Output:   Intake/Output Summary (Last 24 hours) at 03/10/14 0736 Last data filed at 03/10/14 0600  Gross per 24 hour  Intake 408.21 ml  Output   2300 ml  Net -1891.79 ml     Physical Exam: CVP 10-15  General:  Chronically ill appearing. No resp difficulty.  HEENT: normal Neck: supple. JVP difficult to assess but appears elevated.  Carotids 2+ bilat; no bruits. No lymphadenopathy or thryomegaly appreciated.  R central line Cor: PMI nondisplaced. Irregular rate & rhythm. No rubs, gallops or murmurs.  Trace ankle edema.  Lungs: clear Abdomen: obese, soft, nontender, +++distended. No hepatosplenomegaly. No bruits or masses. Good bowel sounds. Extremities: no cyanosis, clubbing, rash  Neuro: alert & orientedx3, cranial nerves grossly intact. moves all 4 extremities w/o difficulty. Affect pleasant  Telemetry: SR 80-90s  Labs: Basic Metabolic Panel:  Recent Labs Lab 03/06/14 0240 03/07/14 0511 03/07/14 1248 03/08/14 0435 03/09/14 0430 03/10/14 0415  NA 136* 134* 131* 137 138 137  K 4.1 5.6* 4.9 3.7 3.1* 3.1*  CL 93* 91* 89* 91* 90* 92*  CO2 24 26 18* 29 35* 36*  GLUCOSE 259* 287* 295* 200* 249* 211*  BUN 57* 60* 64* 62* 46* 37*  CREATININE 2.49* 2.54* 2.96* 2.61* 1.79* 1.65*  CALCIUM 10.0 9.8 9.7 9.6 9.4 9.2  MG 2.2  --   --   --   --   --     Liver Function Tests: No results found for this basename: AST, ALT, ALKPHOS, BILITOT, PROT, ALBUMIN,  in the last 168 hours No results found for this basename: LIPASE, AMYLASE,  in the last 168 hours No results found for this basename: AMMONIA,  in the last 168 hours  CBC:  Recent Labs Lab 03/06/14 0240 03/07/14 0511 03/08/14 0435 03/09/14 0430 03/10/14 0415  WBC 7.9 9.3 9.7 7.4 5.6  NEUTROABS 4.2  --   --   --   --  HGB 12.9* 12.8* 12.9* 12.4* 12.0*  HCT 38.6* 38.6* 37.9* 37.5* 38.0*  MCV 87.9 88.1 87.3 89.5 91.1  PLT 176 190 172 158 124*    Cardiac Enzymes:  Recent Labs Lab 03/06/14 0240 03/06/14 0803 03/06/14 1200  TROPONINI <0.30 <0.30 <0.30    BNP: BNP (last 3 results)  Recent Labs  03/06/14 0240  PROBNP 9697.0*     Other results:    Imaging: No results found.   Medications:     Scheduled Medications: . allopurinol  100 mg Oral Daily  . amiodarone  200 mg Oral BID  . atorvastatin  20 mg Oral q1800  . Chlorhexidine Gluconate Cloth  6 each Topical Q0600  . digoxin  0.0625 mg Oral Daily  .  ezetimibe  10 mg Oral Daily  . furosemide  80 mg Intravenous TID  . hydrALAZINE  12.5 mg Oral 3 times per day  . insulin aspart  0-5 Units Subcutaneous QHS  . insulin aspart  0-9 Units Subcutaneous TID WC  . insulin detemir  20 Units Subcutaneous Daily  . isosorbide mononitrate  30 mg Oral Daily  . linagliptin  5 mg Oral Daily  . mometasone-formoterol  2 puff Inhalation BID  . montelukast  10 mg Oral QHS  . mupirocin ointment  1 application Nasal BID  . potassium chloride  40 mEq Oral Once  . potassium chloride  40 mEq Oral Once  . rivaroxaban  20 mg Oral Daily  . sodium chloride  3 mL Intravenous Q12H    Infusions: . milrinone 0.25 mcg/kg/min (03/10/14 0456)    PRN Medications: sodium chloride, acetaminophen, albuterol, fluticasone, senna-docusate, sodium chloride, traMADol   Assessment:  1. A/C Systolic Heart Failure: Echo (1/61) with severe LV dilation and EF 30%.  2. New onset A fib RVR - converted NSR on amiodarone.  He is on Xarelto. 3. HTN 4. OSA 5. H/O V Tach S/P Medtronic ICD 6. Obesity  7. Gout  8. Cardiorenal Syndrome     Plan/Discussion:   CO-OX up to 73%, still volume overloaded. Continue Milrinone 0.25 mcg. Renal function trending down. Continue 80 mg IV lasix tid today. Hold beta blocker with acute decompensated CHF, no ACEI at this time due to elevated creatinine/AKI.  I will increase hydralazine to 25 mg tid and continue Imdur 30 mg daily.  Continue milrinone until fully diuresed.  It is possible that he may end up needing home inotrope.   Remains in NSR. Continue po amiodarone. Continue Xarelto 20 mg daily (GFR borderline but if continues to improve this dose should be fine). Stop ASA.     Diabetes management per Dr [name].   Ambulate today.   Transfer to stepdown.  Length of Stay: 4  Malkie Wille S Houa Nie,MD 7:36 AM

## 2014-03-10 NOTE — Progress Notes (Signed)
CBGs coming down. More room to go. Titrate Insulin. Will need to be taught how to self inject.

## 2014-03-11 DIAGNOSIS — I509 Heart failure, unspecified: Secondary | ICD-10-CM

## 2014-03-11 LAB — CBC
HEMATOCRIT: 37.8 % — AB (ref 39.0–52.0)
Hemoglobin: 12.1 g/dL — ABNORMAL LOW (ref 13.0–17.0)
MCH: 28.6 pg (ref 26.0–34.0)
MCHC: 32 g/dL (ref 30.0–36.0)
MCV: 89.4 fL (ref 78.0–100.0)
Platelets: 152 10*3/uL (ref 150–400)
RBC: 4.23 MIL/uL (ref 4.22–5.81)
RDW: 15.1 % (ref 11.5–15.5)
WBC: 6.4 10*3/uL (ref 4.0–10.5)

## 2014-03-11 LAB — GLUCOSE, CAPILLARY
GLUCOSE-CAPILLARY: 177 mg/dL — AB (ref 70–99)
Glucose-Capillary: 178 mg/dL — ABNORMAL HIGH (ref 70–99)
Glucose-Capillary: 210 mg/dL — ABNORMAL HIGH (ref 70–99)
Glucose-Capillary: 212 mg/dL — ABNORMAL HIGH (ref 70–99)

## 2014-03-11 LAB — COMPREHENSIVE METABOLIC PANEL
ALK PHOS: 61 U/L (ref 39–117)
ALT: 171 U/L — ABNORMAL HIGH (ref 0–53)
AST: 66 U/L — AB (ref 0–37)
Albumin: 3.7 g/dL (ref 3.5–5.2)
BILIRUBIN TOTAL: 1.4 mg/dL — AB (ref 0.3–1.2)
BUN: 30 mg/dL — ABNORMAL HIGH (ref 6–23)
CHLORIDE: 94 meq/L — AB (ref 96–112)
CO2: 34 meq/L — AB (ref 19–32)
Calcium: 9.1 mg/dL (ref 8.4–10.5)
Creatinine, Ser: 1.5 mg/dL — ABNORMAL HIGH (ref 0.50–1.35)
GFR calc Af Amer: 60 mL/min — ABNORMAL LOW (ref >90)
GFR calc non Af Amer: 51 mL/min — ABNORMAL LOW (ref >90)
Glucose, Bld: 207 mg/dL — ABNORMAL HIGH (ref 70–99)
POTASSIUM: 3.2 meq/L — AB (ref 3.7–5.3)
Sodium: 139 mEq/L (ref 137–147)
Total Protein: 7.1 g/dL (ref 6.0–8.3)

## 2014-03-11 LAB — CARBOXYHEMOGLOBIN
CARBOXYHEMOGLOBIN: 1.8 % — AB (ref 0.5–1.5)
METHEMOGLOBIN: 0.8 % (ref 0.0–1.5)
O2 Saturation: 70.4 %
Total hemoglobin: 12.4 g/dL — ABNORMAL LOW (ref 13.5–18.0)

## 2014-03-11 MED ORDER — TORSEMIDE 20 MG PO TABS
40.0000 mg | ORAL_TABLET | Freq: Two times a day (BID) | ORAL | Status: DC
  Administered 2014-03-12 – 2014-03-13 (×3): 40 mg via ORAL
  Filled 2014-03-11 (×5): qty 2

## 2014-03-11 MED ORDER — POTASSIUM CHLORIDE CRYS ER 20 MEQ PO TBCR
40.0000 meq | EXTENDED_RELEASE_TABLET | Freq: Once | ORAL | Status: AC
  Administered 2014-03-11: 40 meq via ORAL
  Filled 2014-03-11: qty 2

## 2014-03-11 MED ORDER — ISOSORBIDE MONONITRATE ER 30 MG PO TB24
30.0000 mg | ORAL_TABLET | Freq: Two times a day (BID) | ORAL | Status: DC
  Administered 2014-03-11 – 2014-03-13 (×4): 30 mg via ORAL
  Filled 2014-03-11 (×5): qty 1

## 2014-03-11 MED ORDER — SPIRONOLACTONE 12.5 MG HALF TABLET
12.5000 mg | ORAL_TABLET | Freq: Every day | ORAL | Status: DC
  Administered 2014-03-11 – 2014-03-13 (×3): 12.5 mg via ORAL
  Filled 2014-03-11 (×3): qty 1

## 2014-03-11 MED ORDER — HYDRALAZINE HCL 25 MG PO TABS
37.5000 mg | ORAL_TABLET | Freq: Three times a day (TID) | ORAL | Status: DC
  Administered 2014-03-11 – 2014-03-13 (×6): 37.5 mg via ORAL
  Filled 2014-03-11 (×9): qty 1.5

## 2014-03-11 NOTE — Progress Notes (Signed)
Patient ID: Gary Hutchinson, male   DOB: 06/28/1961, 53 y.o.   MRN: [id number] Advanced Heart Failure Rounding Note   Subjective:    Gary Hutchinson is a 53 yo man with PMH of chronic systolic heart failure, last known EF 25-30%, nonischemic cardiomyopathy, T2DM, hypetension, OSA on CPAP, VT s/p ICD, morbid obesity last seen by Dr. Shirlee LatchMcLean mid May 2015 . Weight at that time was 354 pounds.   [facility] with worsening shortness of breath and abdominal distention/weight gain consistent with acute on chronic heart failure. He started on IV lasix. Beta blocker stopped due to acute decompensation.  Ace stopped due to CKD. CEs negative.  He was noted to be in new-onset atrial fibrillation this admission and was started on amiodarone, with conversion back to NSR.   He is now on milrinone gtt and IV Lasix.  Weight stable overnight. I/O -500. Renal function now back to baseline. Feels much better. No SOB or orthopnea. More energy. Maintaining NSR on amio. Dr. Timothy Lassousso adjusting insulin.   CVP 6  Creatinine 1.7> 2.4 >2.5 >2.9 >2.6>1.7>1.65>1.5 CO-OX 42%>55>66>66%>73%>70% on Milrinone 0.25 mcg   Objective:   Weight Range:  Vital Signs:   Temp:  [97.8 F (36.6 C)-98.5 F (36.9 C)] 98.5 F (36.9 C) (06/06 1130) Pulse Rate:  [40-140] 140 (06/06 0200) Resp:  [17-24] 17 (06/06 1130) BP: (109-165)/(61-103) 135/71 mmHg (06/06 1130) SpO2:  [77 %-98 %] 98 % (06/06 1130) Weight:  [156.083 kg (344 lb 1.6 oz)] 156.083 kg (344 lb 1.6 oz) (06/06 0500) Last BM Date: 03/09/14  Weight change: Filed Weights   03/09/14 0318 03/10/14 0639 03/11/14 0500  Weight: 156.7 kg (345 lb 7.4 oz) 156.037 kg (344 lb) 156.083 kg (344 lb 1.6 oz)    Intake/Output:   Intake/Output Summary (Last 24 hours) at 03/11/14 1212 Last data filed at 03/11/14 0800  Gross per 24 hour  Intake    780 ml  Output   2450 ml  Net  -1670 ml     Physical Exam: CVP 6 General:  Sitting in chair. Feels great.  HEENT:  normal Neck: supple. JVP difficult to assess  Carotids 2+ bilat; no bruits. No lymphadenopathy or thryomegaly appreciated. R central line Cor: PMI nondisplaced. Irregular rate & rhythm. No rubs, gallops or murmurs.  No ankle edema.  Lungs: clear Abdomen: obese, soft, nontender, nondistended. No hepatosplenomegaly. No bruits or masses. Good bowel sounds. Extremities: no cyanosis, clubbing, rash  Neuro: alert & orientedx3, cranial nerves grossly intact. moves all 4 extremities w/o difficulty. Affect pleasant  Telemetry: SR 100-110  Labs: Basic Metabolic Panel:  Recent Labs Lab 03/06/14 0240  03/07/14 1248 03/08/14 0435 03/09/14 0430 03/10/14 0415 03/11/14 0400  NA 136*  < > 131* 137 138 137 139  K 4.1  < > 4.9 3.7 3.1* 3.1* 3.2*  CL 93*  < > 89* 91* 90* 92* 94*  CO2 24  < > 18* 29 35* 36* 34*  GLUCOSE 259*  < > 295* 200* 249* 211* 207*  BUN 57*  < > 64* 62* 46* 37* 30*  CREATININE 2.49*  < > 2.96* 2.61* 1.79* 1.65* 1.50*  CALCIUM 10.0  < > 9.7 9.6 9.4 9.2 9.1  MG 2.2  --   --   --   --   --   --   < > = values in this interval not displayed.  Liver Function Tests:  Recent Labs Lab 03/11/14 0400  AST 66*  ALT 171*  ALKPHOS 61  BILITOT 1.4*  PROT 7.1  ALBUMIN 3.7   No results found for this basename: LIPASE, AMYLASE,  in the last 168 hours No results found for this basename: AMMONIA,  in the last 168 hours  CBC:  Recent Labs Lab 03/06/14 0240 03/07/14 0511 03/08/14 0435 03/09/14 0430 03/10/14 0415 03/11/14 0400  WBC 7.9 9.3 9.7 7.4 5.6 6.4  NEUTROABS 4.2  --   --   --   --   --   HGB 12.9* 12.8* 12.9* 12.4* 12.0* 12.1*  HCT 38.6* 38.6* 37.9* 37.5* 38.0* 37.8*  MCV 87.9 88.1 87.3 89.5 91.1 89.4  PLT 176 190 172 158 124* 152    Cardiac Enzymes:  Recent Labs Lab 03/06/14 0240 03/06/14 0803 03/06/14 1200  TROPONINI <0.30 <0.30 <0.30    BNP: BNP (last 3 results)  Recent Labs  03/06/14 0240  PROBNP 9697.0*     Other  results:    Imaging: No results found.   Medications:     Scheduled Medications: . allopurinol  100 mg Oral Daily  . amiodarone  200 mg Oral BID  . atorvastatin  20 mg Oral q1800  . Chlorhexidine Gluconate Cloth  6 each Topical Q0600  . digoxin  0.0625 mg Oral Daily  . ezetimibe  10 mg Oral Daily  . furosemide  80 mg Intravenous TID  . hydrALAZINE  25 mg Oral 3 times per day  . insulin aspart  0-5 Units Subcutaneous QHS  . insulin aspart  0-9 Units Subcutaneous TID WC  . insulin detemir  24 Units Subcutaneous Daily  . isosorbide mononitrate  30 mg Oral Daily  . linagliptin  5 mg Oral Daily  . mometasone-formoterol  2 puff Inhalation BID  . montelukast  10 mg Oral QHS  . mupirocin ointment  1 application Nasal BID  . rivaroxaban  20 mg Oral Daily  . sodium chloride  3 mL Intravenous Q12H    Infusions: . milrinone 0.25 mcg/kg/min (03/11/14 0544)    PRN Medications: sodium chloride, acetaminophen, albuterol, fluticasone, senna-docusate, sodium chloride, traMADol   Assessment:  1. A/C Systolic Heart Failure: Echo (0/07) with severe LV dilation and EF 30%.  2. New onset A fib RVR - converted NSR on amiodarone.  He is on Xarelto. 3. HTN 4. OSA 5. H/O V Tach S/P Medtronic ICD 6. Obesity  7. Gout  8. A/C renal failure due to Cardiorenal Syndrome  - now resolved  9. Diabetes 10. Hypokalemia   Plan/Discussion:    Much, much improved. Now euvolemic. Co-ox looks good. Renal function back to baseline. Will begin milrinone wean. Titrate hydral/imdur. Possibly home Monday if co-ox stable off inotropes.   Remains in NSR. Continue po amiodarone. Continue Xarelto 20 mg daily.  Supp K+  Will not restart b-blocker until inotropes off.    Agnieszka Newhouse R Jkwon Treptow,MD 12:12 PM

## 2014-03-11 NOTE — Progress Notes (Signed)
CARDIAC REHAB PHASE I   PRE:  Rate/Rhythm: 88 SR    BP: sitting 135/71    SaO2: 94 RA  MODE:  Ambulation: 390 ft   POST:  Rate/Rhythm: 150s, 164    BP: sitting 172/86     SaO2: 94 RA  Pt thankful to walk. Denied problems however HR very elevated today, 150s to 164. Looks to have many PAC/PVCs . Notified RN. Will continue to follow. [id number]  Megan Salon CES, ACSM 03/11/2014 11:52 AM

## 2014-03-12 LAB — BASIC METABOLIC PANEL
BUN: 26 mg/dL — ABNORMAL HIGH (ref 6–23)
CO2: 31 mEq/L (ref 19–32)
Calcium: 9.5 mg/dL (ref 8.4–10.5)
Chloride: 99 mEq/L (ref 96–112)
Creatinine, Ser: 1.54 mg/dL — ABNORMAL HIGH (ref 0.50–1.35)
GFR calc Af Amer: 58 mL/min — ABNORMAL LOW (ref >90)
GFR calc non Af Amer: 50 mL/min — ABNORMAL LOW (ref >90)
Glucose, Bld: 165 mg/dL — ABNORMAL HIGH (ref 70–99)
Potassium: 3.9 mEq/L (ref 3.7–5.3)
Sodium: 142 mEq/L (ref 137–147)

## 2014-03-12 LAB — CBC
HCT: 38.1 % — ABNORMAL LOW (ref 39.0–52.0)
Hemoglobin: 12.2 g/dL — ABNORMAL LOW (ref 13.0–17.0)
MCH: 28.5 pg (ref 26.0–34.0)
MCHC: 32 g/dL (ref 30.0–36.0)
MCV: 89 fL (ref 78.0–100.0)
Platelets: 151 10*3/uL (ref 150–400)
RBC: 4.28 MIL/uL (ref 4.22–5.81)
RDW: 15.2 % (ref 11.5–15.5)
WBC: 6.5 10*3/uL (ref 4.0–10.5)

## 2014-03-12 LAB — GLUCOSE, CAPILLARY
Glucose-Capillary: 163 mg/dL — ABNORMAL HIGH (ref 70–99)
Glucose-Capillary: 178 mg/dL — ABNORMAL HIGH (ref 70–99)
Glucose-Capillary: 284 mg/dL — ABNORMAL HIGH (ref 70–99)
Glucose-Capillary: 159 mg/dL — ABNORMAL HIGH (ref 70–99)
Glucose-Capillary: 137 mg/dL — ABNORMAL HIGH (ref 70–99)

## 2014-03-12 LAB — CARBOXYHEMOGLOBIN
Carboxyhemoglobin: 1.8 % — ABNORMAL HIGH (ref 0.5–1.5)
METHEMOGLOBIN: 0.8 % (ref 0.0–1.5)
O2 Saturation: 62.5 %
TOTAL HEMOGLOBIN: 12.8 g/dL — AB (ref 13.5–18.0)

## 2014-03-12 LAB — MAGNESIUM: Magnesium: 2.3 mg/dL (ref 1.5–2.5)

## 2014-03-12 MED ORDER — FLEET ENEMA 7-19 GM/118ML RE ENEM
1.0000 | ENEMA | Freq: Once | RECTAL | Status: AC
  Administered 2014-03-13: 1 via RECTAL
  Filled 2014-03-12: qty 1

## 2014-03-12 NOTE — Progress Notes (Signed)
Patient ID: Gary Hutchinson, male   DOB: May 28, 1961, 53 y.o.   MRN: [id number] Advanced Heart Failure Rounding Note   Subjective:    Gary Hutchinson is a 53 yo man with PMH of chronic systolic heart failure, last known EF 25-30%, nonischemic cardiomyopathy, T2DM, hypetension, OSA on CPAP, VT s/p ICD, morbid obesity last seen by Dr. Shirlee LatchMcLean mid May 2015 . Weight at that time was 354 pounds.   [facility] with worsening shortness of breath and abdominal distention/weight gain consistent with acute on chronic heart failure. He started on IV lasix. Beta blocker stopped due to acute decompensation.  Ace stopped due to CKD. CEs negative.  He was noted to be in new-onset atrial fibrillation this admission and was started on amiodarone, with conversion back to NSR.   IV lasix transitioned to po yesterday. Milrinone cut back to 0.125. Weight and renal function stable. Co-ox 62%. Denies SOB, edema. Ambulating floor.  CVP 7  Creatinine 1.7> 2.4 >2.5 >2.9 >2.6>1.7>1.65>1.5>1,5 CO-OX 42%>55>66>66%>73%>70%>62%  Objective:   Weight Range:  Vital Signs:   Temp:  [98 F (36.7 C)-98.5 F (36.9 C)] 98.2 F (36.8 C) (06/07 0814) Pulse Rate:  [84-117] 84 (06/07 0520) Resp:  [17-22] 19 (06/07 0814) BP: (114-150)/(71-80) 114/73 mmHg (06/07 0814) SpO2:  [95 %-98 %] 95 % (06/07 0814) Weight:  [156.355 kg (344 lb 11.2 oz)] 156.355 kg (344 lb 11.2 oz) (06/07 0500) Last BM Date: 03/09/14  Weight change: Filed Weights   03/10/14 0639 03/11/14 0500 03/12/14 0500  Weight: 156.037 kg (344 lb) 156.083 kg (344 lb 1.6 oz) 156.355 kg (344 lb 11.2 oz)    Intake/Output:   Intake/Output Summary (Last 24 hours) at 03/12/14 1117 Last data filed at 03/12/14 0900  Gross per 24 hour  Intake    674 ml  Output    500 ml  Net    174 ml     Physical Exam: CVP 7 General:  Sitting in chair. Feels great.  HEENT: normal Neck: supple. JVP difficult to assess  Carotids 2+ bilat; no bruits. No lymphadenopathy  or thryomegaly appreciated. R central line Cor: PMI nondisplaced. Irregular rate & rhythm. No rubs, gallops or murmurs.  No ankle edema.  Lungs: clear Abdomen: obese, soft, nontender, nondistended. No hepatosplenomegaly. No bruits or masses. Good bowel sounds. Extremities: no cyanosis, clubbing, rash  Neuro: alert & orientedx3, cranial nerves grossly intact. moves all 4 extremities w/o difficulty. Affect pleasant  Telemetry: SR 90-100  Labs: Basic Metabolic Panel:  Recent Labs Lab 03/06/14 0240  03/08/14 0435 03/09/14 0430 03/10/14 0415 03/11/14 0400 03/12/14 0515  NA 136*  < > 137 138 137 139 142  K 4.1  < > 3.7 3.1* 3.1* 3.2* 3.9  CL 93*  < > 91* 90* 92* 94* 99  CO2 24  < > 29 35* 36* 34* 31  GLUCOSE 259*  < > 200* 249* 211* 207* 165*  BUN 57*  < > 62* 46* 37* 30* 26*  CREATININE 2.49*  < > 2.61* 1.79* 1.65* 1.50* 1.54*  CALCIUM 10.0  < > 9.6 9.4 9.2 9.1 9.5  MG 2.2  --   --   --   --   --  2.3  < > = values in this interval not displayed.  Liver Function Tests:  Recent Labs Lab 03/11/14 0400  AST 66*  ALT 171*  ALKPHOS 61  BILITOT 1.4*  PROT 7.1  ALBUMIN 3.7   No results found for this basename:  LIPASE, AMYLASE,  in the last 168 hours No results found for this basename: AMMONIA,  in the last 168 hours  CBC:  Recent Labs Lab 03/06/14 0240  03/08/14 0435 03/09/14 0430 03/10/14 0415 03/11/14 0400 03/12/14 0515  WBC 7.9  < > 9.7 7.4 5.6 6.4 6.5  NEUTROABS 4.2  --   --   --   --   --   --   HGB 12.9*  < > 12.9* 12.4* 12.0* 12.1* 12.2*  HCT 38.6*  < > 37.9* 37.5* 38.0* 37.8* 38.1*  MCV 87.9  < > 87.3 89.5 91.1 89.4 89.0  PLT 176  < > 172 158 124* 152 151  < > = values in this interval not displayed.  Cardiac Enzymes:  Recent Labs Lab 03/06/14 0240 03/06/14 0803 03/06/14 1200  TROPONINI <0.30 <0.30 <0.30    BNP: BNP (last 3 results)  Recent Labs  03/06/14 0240  PROBNP 9697.0*     Other results:    Imaging: No results  found.   Medications:     Scheduled Medications: . allopurinol  100 mg Oral Daily  . amiodarone  200 mg Oral BID  . atorvastatin  20 mg Oral q1800  . digoxin  0.0625 mg Oral Daily  . ezetimibe  10 mg Oral Daily  . hydrALAZINE  37.5 mg Oral 3 times per day  . insulin aspart  0-5 Units Subcutaneous QHS  . insulin aspart  0-9 Units Subcutaneous TID WC  . insulin detemir  24 Units Subcutaneous Daily  . isosorbide mononitrate  30 mg Oral BID  . linagliptin  5 mg Oral Daily  . mometasone-formoterol  2 puff Inhalation BID  . montelukast  10 mg Oral QHS  . rivaroxaban  20 mg Oral Daily  . sodium chloride  3 mL Intravenous Q12H  . spironolactone  12.5 mg Oral Daily  . torsemide  40 mg Oral BID    Infusions: . milrinone 0.125 mcg/kg/min (03/12/14 0612)    PRN Medications: sodium chloride, acetaminophen, albuterol, fluticasone, senna-docusate, sodium chloride, traMADol   Assessment:  1. A/C Systolic Heart Failure: Echo (9/37) with severe LV dilation and EF 30%.  2. New onset A fib RVR - converted NSR on amiodarone.  He is on Xarelto. 3. HTN 4. OSA 5. H/O V Tach S/P Medtronic ICD 6. Obesity  7. Gout  8. A/C renal failure due to Cardiorenal Syndrome  - now resolved  9. Diabetes 10. Hypokalemia   Plan/Discussion:    Tolerating milrinone wean. Will stop today. Possibly home Monday if co-ox >= 55% off inotropes.   Remains in NSR. Continue po amiodarone. Continue Xarelto 20 mg daily.   Will not restart b-blocker until inotropes off.  Have been titrating hydral/NTG. ACE on hold due to renal failure. Will consider restarting in am    Bevelyn Buckles Virgle Arth,MD 11:17 AM

## 2014-03-13 LAB — BASIC METABOLIC PANEL
BUN: 25 mg/dL — ABNORMAL HIGH (ref 6–23)
CO2: 31 meq/L (ref 19–32)
Calcium: 9 mg/dL (ref 8.4–10.5)
Chloride: 97 mEq/L (ref 96–112)
Creatinine, Ser: 1.52 mg/dL — ABNORMAL HIGH (ref 0.50–1.35)
GFR calc Af Amer: 59 mL/min — ABNORMAL LOW (ref >90)
GFR, EST NON AFRICAN AMERICAN: 51 mL/min — AB (ref >90)
Glucose, Bld: 193 mg/dL — ABNORMAL HIGH (ref 70–99)
Potassium: 3.6 mEq/L — ABNORMAL LOW (ref 3.7–5.3)
SODIUM: 139 meq/L (ref 137–147)

## 2014-03-13 LAB — CBC
HEMATOCRIT: 38.1 % — AB (ref 39.0–52.0)
HEMOGLOBIN: 12.4 g/dL — AB (ref 13.0–17.0)
MCH: 28.8 pg (ref 26.0–34.0)
MCHC: 32.5 g/dL (ref 30.0–36.0)
MCV: 88.6 fL (ref 78.0–100.0)
Platelets: 154 10*3/uL (ref 150–400)
RBC: 4.3 MIL/uL (ref 4.22–5.81)
RDW: 15 % (ref 11.5–15.5)
WBC: 7.5 10*3/uL (ref 4.0–10.5)

## 2014-03-13 LAB — GLUCOSE, CAPILLARY
GLUCOSE-CAPILLARY: 171 mg/dL — AB (ref 70–99)
GLUCOSE-CAPILLARY: 234 mg/dL — AB (ref 70–99)

## 2014-03-13 LAB — CARBOXYHEMOGLOBIN
Carboxyhemoglobin: 1.8 % — ABNORMAL HIGH (ref 0.5–1.5)
Methemoglobin: 0.9 % (ref 0.0–1.5)
O2 Saturation: 70.4 %
TOTAL HEMOGLOBIN: 12.8 g/dL — AB (ref 13.5–18.0)

## 2014-03-13 LAB — DIGOXIN LEVEL: Digoxin Level: 0.4 ng/mL — ABNORMAL LOW (ref 0.8–2.0)

## 2014-03-13 MED ORDER — SPIRONOLACTONE 25 MG PO TABS: 12.5000 mg | ORAL_TABLET | Freq: Every day | ORAL | Status: DC

## 2014-03-13 MED ORDER — METOLAZONE 2.5 MG PO TABS: ORAL_TABLET | ORAL | Status: DC

## 2014-03-13 MED ORDER — HYDRALAZINE HCL 25 MG PO TABS: 37.5000 mg | ORAL_TABLET | Freq: Three times a day (TID) | ORAL | Status: DC

## 2014-03-13 MED ORDER — AMIODARONE HCL 200 MG PO TABS: 200.0000 mg | ORAL_TABLET | Freq: Two times a day (BID) | ORAL | Status: DC

## 2014-03-13 MED ORDER — LINAGLIPTIN 5 MG PO TABS: 5.0000 mg | ORAL_TABLET | Freq: Every day | ORAL | Status: DC

## 2014-03-13 MED ORDER — CARVEDILOL 3.125 MG PO TABS
3.1250 mg | ORAL_TABLET | Freq: Two times a day (BID) | ORAL | Status: DC
  Administered 2014-03-13: 3.125 mg via ORAL
  Filled 2014-03-13 (×3): qty 1

## 2014-03-13 MED ORDER — RIVAROXABAN 20 MG PO TABS: 20.0000 mg | ORAL_TABLET | Freq: Every day | ORAL | Status: DC

## 2014-03-13 MED ORDER — BISOPROLOL FUMARATE 5 MG PO TABS: 5.0000 mg | ORAL_TABLET | Freq: Every day | ORAL | Status: DC

## 2014-03-13 MED ORDER — ISOSORBIDE MONONITRATE ER 30 MG PO TB24: 30.0000 mg | ORAL_TABLET | Freq: Two times a day (BID) | ORAL | Status: DC

## 2014-03-13 MED ORDER — INSULIN DETEMIR 100 UNIT/ML ~~LOC~~ SOLN: 24.0000 [IU] | Freq: Every day | SUBCUTANEOUS | Status: DC

## 2014-03-13 MED ORDER — BISOPROLOL FUMARATE 10 MG PO TABS: 5.0000 mg | ORAL_TABLET | Freq: Every day | ORAL | Status: DC

## 2014-03-13 MED ORDER — POTASSIUM CHLORIDE CRYS ER 20 MEQ PO TBCR
40.0000 meq | EXTENDED_RELEASE_TABLET | Freq: Once | ORAL | Status: AC
  Administered 2014-03-13: 40 meq via ORAL
  Filled 2014-03-13: qty 2

## 2014-03-13 NOTE — Progress Notes (Signed)
Most CBGs now in 100's. One yesterday to 74 OK to D/c on Tradjenta 5 mg PO daily and Levimir 24 HS. If he goes home today Give his pm shot of Levimir before D/c and I will have a NP or Pharm D see him tomorrow and do Insulin training in my office.

## 2014-03-13 NOTE — Progress Notes (Signed)
[id number] Cardiac Rehab Completed CHF education with pt. He voices understanding. Pt agrees to Outpt. CRP in [place], will send referral. Pt states that he is ready to make lifestyle changes and seems motivated.  Beatrix Fetters, RN 03/13/2014 12:38 PM

## 2014-03-13 NOTE — Progress Notes (Signed)
Patient ID: Gary Hutchinson, male   DOB: 25-Jun-1961, 53 y.o.   MRN: [id number] Advanced Heart Failure Rounding Note   Subjective:    Gary Hutchinson is a 53 yo man with PMH of chronic systolic heart failure, last known EF 25-30%, nonischemic cardiomyopathy, T2DM, hypetension, OSA on CPAP, VT s/p ICD, morbid obesity last seen by Dr. Shirlee LatchMcLean mid May 2015 . Weight at that time was 354 pounds.   [facility] with worsening shortness of breath and abdominal distention/weight gain consistent with acute on chronic heart failure. He started on IV lasix. Beta blocker stopped due to acute decompensation.  Ace stopped due to CKD. CEs negative.  He was noted to be in new-onset atrial fibrillation this admission and was started on amiodarone, with conversion back to NSR.   Milrinone stopped yesterday and co-ox 70%. 24 hr I/O -1.6 liters. Renal function stable. Denies SOB, orthopnea, or CP.  CVP 6-7  Creatinine 1.7> 2.4 >2.5 >2.9 >2.6>1.7>1.65>1.5>1.5 CO-OX 42%>55>66>66%>73%>70%>62%> 70%  Objective:   Weight Range:  Vital Signs:   Temp:  [97.7 F (36.5 C)-98.8 F (37.1 C)] 97.7 F (36.5 C) (06/08 0300) Pulse Rate:  [89-98] 90 (06/08 0300) Resp:  [16-21] 16 (06/08 0300) BP: (105-158)/(59-92) 105/59 mmHg (06/08 0400) SpO2:  [95 %-98 %] 96 % (06/08 0300) Weight:  [344 lb 3.2 oz (156.128 kg)] 344 lb 3.2 oz (156.128 kg) (06/08 0500) Last BM Date: 03/09/14  Weight change: Filed Weights   03/11/14 0500 03/12/14 0500 03/13/14 0500  Weight: 344 lb 1.6 oz (156.083 kg) 344 lb 11.2 oz (156.355 kg) 344 lb 3.2 oz (156.128 kg)    Intake/Output:   Intake/Output Summary (Last 24 hours) at 03/13/14 0706 Last data filed at 03/12/14 2100  Gross per 24 hour  Intake    152 ml  Output   1750 ml  Net  -1598 ml     Physical Exam: CVP 7 General:  Sitting in chair. Feels great.  HEENT: normal Neck: supple. JVP difficult to assess d/t body habitus but appears flat;  Carotids 2+ bilat; no bruits. No  lymphadenopathy or thryomegaly appreciated. R central line Cor: PMI nondisplaced. Regular rate &  rhythm. No rubs, gallops or murmurs. trace ankle edema.  Lungs: clear Abdomen: obese, soft, nontender, nondistended. No hepatosplenomegaly. No bruits or masses. Good bowel sounds. Extremities: no cyanosis, clubbing, rash  Neuro: alert & orientedx3, cranial nerves grossly intact. moves all 4 extremities w/o difficulty. Affect pleasant  Telemetry: SR 90-100  Labs: Basic Metabolic Panel:  Recent Labs Lab 03/09/14 0430 03/10/14 0415 03/11/14 0400 03/12/14 0515 03/13/14 0415  NA 138 137 139 142 139  K 3.1* 3.1* 3.2* 3.9 3.6*  CL 90* 92* 94* 99 97  CO2 35* 36* 34* 31 31  GLUCOSE 249* 211* 207* 165* 193*  BUN 46* 37* 30* 26* 25*  CREATININE 1.79* 1.65* 1.50* 1.54* 1.52*  CALCIUM 9.4 9.2 9.1 9.5 9.0  MG  --   --   --  2.3  --     Liver Function Tests:  Recent Labs Lab 03/11/14 0400  AST 66*  ALT 171*  ALKPHOS 61  BILITOT 1.4*  PROT 7.1  ALBUMIN 3.7   No results found for this basename: LIPASE, AMYLASE,  in the last 168 hours No results found for this basename: AMMONIA,  in the last 168 hours  CBC:  Recent Labs Lab 03/09/14 0430 03/10/14 0415 03/11/14 0400 03/12/14 0515 03/13/14 0415  WBC 7.4 5.6 6.4 6.5 7.5  HGB 12.4* 12.0* 12.1* 12.2* 12.4*  HCT 37.5* 38.0* 37.8* 38.1* 38.1*  MCV 89.5 91.1 89.4 89.0 88.6  PLT 158 124* 152 151 154    Cardiac Enzymes:  Recent Labs Lab 03/06/14 0803 03/06/14 1200  TROPONINI <0.30 <0.30    BNP: BNP (last 3 results)  Recent Labs  03/06/14 0240  PROBNP 9697.0*     Other results:    Imaging: No results found.   Medications:     Scheduled Medications: . allopurinol  100 mg Oral Daily  . amiodarone  200 mg Oral BID  . atorvastatin  20 mg Oral q1800  . digoxin  0.0625 mg Oral Daily  . ezetimibe  10 mg Oral Daily  . hydrALAZINE  37.5 mg Oral 3 times per day  . insulin aspart  0-5 Units Subcutaneous QHS  .  insulin aspart  0-9 Units Subcutaneous TID WC  . insulin detemir  24 Units Subcutaneous Daily  . isosorbide mononitrate  30 mg Oral BID  . linagliptin  5 mg Oral Daily  . mometasone-formoterol  2 puff Inhalation BID  . montelukast  10 mg Oral QHS  . rivaroxaban  20 mg Oral Daily  . sodium chloride  3 mL Intravenous Q12H  . spironolactone  12.5 mg Oral Daily  . torsemide  40 mg Oral BID    Infusions:    PRN Medications: sodium chloride, acetaminophen, albuterol, fluticasone, senna-docusate, sodium chloride, traMADol   Assessment:  1. A/C Systolic Heart Failure: Echo (0/09) with severe LV dilation and EF 30%.  2. New onset A fib RVR - converted NSR on amiodarone.  He is on Xarelto. 3. HTN 4. OSA 5. H/O V Tach S/P Medtronic ICD 6. Obesity  7. Gout  8. A/C renal failure due to Cardiorenal Syndrome  - now resolved  9. Diabetes 10. Hypokalemia   Plan/Discussion:    Milrinone turned off yesterday and co-ox 70% this am. CVP 6-7 and renal function stable. Will continue PO diuretics.  Remains in NSR. Continue po amiodarone and Xarelto 20 mg daily. Will place consult to CM to check on cost.   Will start back bisoprolol at half-dose - 5 mg daily. Continue hydralazine/nitrates at current doses. Will continue to hold ACE-I with renal failure. Cr stable will try to restart on OP side. Can go home today with close follow up in the HF clinic.   Aundria Rud, NP-C 7:06 AM  Patient seen and examined with Gary Potash, NP. We discussed all aspects of the encounter. I agree with the assessment and plan as stated above.   Very stable off milrinone. Agree with d/c home today. Can restart b-blocker gently. Would continue to hold ACE and can restart as outpatient.   Continue amio 200 bid with eventual wean to 200 daily. Continue Xarelto.   Daniel R [name],MD 9:12 AM

## 2014-03-13 NOTE — Discharge Summary (Signed)
Advanced Heart Failure Team  Discharge Summary   Patient ID: Gary Hutchinson MRN: [id number], DOB/AGE: [birth year] 53 y.o. Admit date: 03/06/2014 D/C date:     03/13/2014   PCP: Dr. [name]  Primary Discharge Diagnoses:  1) A/C systolic heart failure - EF 30% with severe dilation (03/2014) - Diuresed net negative -8.8 liters on IV lasix and milrinone. Discharge weight 344 lbs 2) Cardiogenic shock - initial co-ox 42%. Placed on milrinone which was weaned off 3) New onset Afib with RVR - Placed on IV amiodarone and converted to NSR - On Xarelto  Secondary Discharge Diagnoses:  1) HTN 2) OSA 3) Gout 4) AKI due to cardiorenal syndrome - resolved 5) DM2 6) Hypokalemia 7) h/o VT s/p Medtronic ICD  Hospital Course:  Gary Hutchinson is 53 yo male with a history of chronic systolic heart failure, NICM s/p Medtronic ICD, DM2, HTN, OSA on CPAP, V/T s/p ICD, and morbid obesity.  He presented to the ED on 6/1 with dyspnea and volume overload. He was also found to be in afib with RVR which was new for him. Pertinent labs on admission were digoxin 0.3, Magnesium 2.2, pro-BNP 9697, Troponin < 0.30, K+ 4.1 and creatinine 2.49. He was started on IV lasix, heparin and his BB and ACE-I were placed on hold. He remained volume overloaded and he was transitioned to lasix gtt and was started on IV amiodarone with a bolus. His heparin was discontinued once Xarelto was started.   He continued to deteroriate and was transferred to ICU for central access. Intital CVP ~20-25 and co-ox 42% confirming cardiogenic shock and marked volume overload and he was started on milrinone. He diuresed briskly and his milrinone was slowly weaned off. His Cr continued to improve and returned back to baseline. He ended up converting back to NSR on IV amiodarone and eventually was transitioned to PO amiodarone. He diuresed a total of net negative 8 liters and discharge weight was 344 lbs. CR was consulted for ambulation and education. Of note  his Hgb A1C was 9.6 and he was started on detemir and tradjenta 5 mg. He will need follow up with PCP for DM management which is already set up for tomorrow. While in the hospital his DM2 was managed by Dr. [name].   On day of discharge his volume status was stable and Cr was back to baseline 1.52. He was ambulating in the halls with no issues. His BB was restarted at half dose, bisoprolol 5 mg daily and his ACE-I continued to be on hold. We will try to restart low dose ACE-I on the OP side. He will be followed closely in the HF clinic and will need a BMET next week.   Discharge Weight Range: 344-348 lbs Discharge Vitals: Blood pressure 133/71, pulse 94, temperature 98.2 F (36.8 C), temperature source Oral, resp. rate 17, height 5\' 6"  (1.676 m), weight 344 lb 3.2 oz (156.128 kg), SpO2 95.00%.  Labs: Lab Results  Component Value Date   WBC 7.5 03/13/2014   HGB 12.4* 03/13/2014   HCT 38.1* 03/13/2014   MCV 88.6 03/13/2014   PLT 154 03/13/2014    Recent Labs Lab 03/11/14 0400  03/13/14 0415  NA 139  < > 139  K 3.2*  < > 3.6*  CL 94*  < > 97  CO2 34*  < > 31  BUN 30*  < > 25*  CREATININE 1.50*  < > 1.52*  CALCIUM 9.1  < > 9.0  PROT 7.1  --   --  BILITOT 1.4*  --   --   ALKPHOS 61  --   --   ALT 171*  --   --   AST 66*  --   --   GLUCOSE 207*  < > 193*  < > = values in this interval not displayed. Lab Results  Component Value Date   CHOL 181 04/09/2011   HDL 37* 04/09/2011   LDLCALC 117* 04/09/2011   TRIG 137 04/09/2011   BNP (last 3 results)  Recent Labs  03/06/14 0240  PROBNP 9697.0*    Diagnostic Studies/Procedures   No results found.  Discharge Medications     Medication List    STOP taking these medications       glipizide-metformin 2.5-250 MG per tablet  Commonly known as:  METAGLIP     ramipril 5 MG capsule  Commonly known as:  ALTACE      TAKE these medications       acetaminophen 325 MG tablet  Commonly known as:  TYLENOL  Take 2 tablets (650 mg total) by  mouth every 6 (six) hours as needed.     ADVAIR DISKUS 250-50 MCG/DOSE Aepb  Generic drug:  Fluticasone-Salmeterol  Inhale 1 puff into the lungs every 12 (twelve) hours as needed. For wheezing or shortness of breath     allopurinol 100 MG tablet  Commonly known as:  ZYLOPRIM  Take 1 tablet by mouth daily.     amiodarone 200 MG tablet  Commonly known as:  PACERONE  Take 1 tablet (200 mg total) by mouth 2 (two) times daily.     aspirin 81 MG tablet  Take 81 mg by mouth daily.     bisoprolol 10 MG tablet  Commonly known as:  ZEBETA  Take 0.5 tablets (5 mg total) by mouth daily.  Start taking on:  03/14/2014     colchicine 0.6 MG tablet  Take 0.6 mg by mouth daily as needed. Gout flare ups     digoxin 0.25 MG tablet  Commonly known as:  LANOXIN  Take 0.5 tablets (125 mcg total) by mouth every other day.     ezetimibe 10 MG tablet  Commonly known as:  ZETIA  Take 10 mg by mouth daily.     fluticasone 50 MCG/ACT nasal spray  Commonly known as:  FLONASE  Place 2 sprays into the nose as needed.     hydrALAZINE 25 MG tablet  Commonly known as:  APRESOLINE  Take 1.5 tablets (37.5 mg total) by mouth every 8 (eight) hours.     insulin detemir 100 UNIT/ML injection  Commonly known as:  LEVEMIR  Inject 0.24 mLs (24 Units total) into the skin at bedtime.     isosorbide mononitrate 30 MG 24 hr tablet  Commonly known as:  IMDUR  Take 1 tablet (30 mg total) by mouth 2 (two) times daily.     linagliptin 5 MG Tabs tablet  Commonly known as:  TRADJENTA  Take 1 tablet (5 mg total) by mouth daily.     metolazone 2.5 MG tablet  Commonly known as:  ZAROXOLYN  Take 2.5 mg every Tue and Sat only if weight is greater than 340 lbs     montelukast 10 MG tablet  Commonly known as:  SINGULAIR  Take 10 mg by mouth daily as needed. For shortness of breath or wheezing     potassium chloride SA 20 MEQ tablet  Commonly known as:  K-DUR,KLOR-CON  Take 1 tablet (20 mEq total) by mouth  2 (two)  times a week. Every Tue and Sat with Metolazone     PROAIR HFA 108 (90 BASE) MCG/ACT inhaler  Generic drug:  albuterol  Inhale 2 puffs into the lungs every 6 (six) hours as needed. For wheezing or shortness of breath     rivaroxaban 20 MG Tabs tablet  Commonly known as:  XARELTO  Take 1 tablet (20 mg total) by mouth daily.     simvastatin 40 MG tablet  Commonly known as:  ZOCOR  Take 1 tablet (40 mg total) by mouth at bedtime.     spironolactone 25 MG tablet  Commonly known as:  ALDACTONE  Take 0.5 tablets (12.5 mg total) by mouth daily.     torsemide 20 MG tablet  Commonly known as:  DEMADEX  Take 2 tablets (40 mg total) by mouth 2 (two) times daily.     traMADol 50 MG tablet  Commonly known as:  ULTRAM  Take 1 tablet (50 mg total) by mouth every 8 (eight) hours as needed.        Disposition   The patient will be discharged in stable condition to home. Discharge Instructions   (HEART FAILURE PATIENTS) Call MD:  Anytime you have any of the following symptoms: 1) 3 pound weight gain in 24 hours or 5 pounds in 1 week 2) shortness of breath, with or without a dry hacking cough 3) swelling in the hands, feet or stomach 4) if you have to sleep on extra pillows at night in order to breathe.    Complete by:  As directed      Beta Blocker already ordered    Complete by:  As directed      Contraindication to ACEI at discharge    Complete by:  As directed      Diet - low sodium heart healthy    Complete by:  As directed      Discharge instructions    Complete by:  As directed   Please bring all your medications to your visit or you may not be seen.     Discontinue central line    Complete by:  As directed      Heart Failure patients record your daily weight using the same scale at the same time of day    Complete by:  As directed      Increase activity slowly    Complete by:  As directed      STOP any activity that causes chest pain, shortness of breath, dizziness, sweating,  or exessive weakness    Complete by:  As directed           Follow-up Information   Follow up with [facility] On 03/20/2014. (Heart Failure Clinic @ 11;15 am. Gate code 0400. please bring all your medications to your visit. )    Specialty:  Cardiology   Contact information:   [address] [id number] [place] [id number] [phone]        Duration of Discharge Encounter: Greater than 35 minutes   Signed, Aundria Rudli B Cosgrove  03/13/2014, 9:41 AM  Patient seen and examined with Ulla PotashAli Cosgrove, NP. We discussed all aspects of the encounter. I agree with the assessment and plan as stated above. I have edited the note with my changes.  Yecenia Dalgleish,MD 8:10 PM

## 2014-03-15 ENCOUNTER — Encounter: Payer: Self-pay | Admitting: Internal Medicine

## 2014-03-16 ENCOUNTER — Telehealth (HOSPITAL_COMMUNITY): Payer: Self-pay | Admitting: Cardiology

## 2014-03-16 NOTE — Telephone Encounter (Signed)
Pt called with update 6/9 weight 340 B/P 118/65 HE 89 6/10 weight 338 B/P 113/71 HR 88  Pt states he has watched multiple commercials regarding the side effects, law suits, and warnings regarding Xarelto Pt will not start this med. Would like to begin something else  Please advise

## 2014-03-16 NOTE — Telephone Encounter (Signed)
Spoke w/pt reassured him about Xarelto he is agreeable to take medication for now, has f/u appt on Monday and states he is doing really well since being out of the hospital

## 2014-03-20 ENCOUNTER — Encounter (HOSPITAL_COMMUNITY): Payer: Self-pay

## 2014-03-20 ENCOUNTER — Telehealth (HOSPITAL_COMMUNITY): Payer: Self-pay | Admitting: *Deleted

## 2014-03-20 ENCOUNTER — Ambulatory Visit (HOSPITAL_COMMUNITY)
Admit: 2014-03-20 | Discharge: 2014-03-20 | Disposition: A | Discharge status: Home / Self Care | Payer: Medicare Other | Attending: Internal Medicine | Admitting: Internal Medicine

## 2014-03-20 VITALS — BP 114/76 | HR 84 | Resp 18 | Wt 338.4 lb

## 2014-03-20 DIAGNOSIS — Z7982 Long term (current) use of aspirin: Secondary | ICD-10-CM | POA: Insufficient documentation

## 2014-03-20 DIAGNOSIS — I4891 Unspecified atrial fibrillation: Secondary | ICD-10-CM | POA: Insufficient documentation

## 2014-03-20 DIAGNOSIS — E669 Obesity, unspecified: Secondary | ICD-10-CM | POA: Insufficient documentation

## 2014-03-20 DIAGNOSIS — Z95810 Presence of automatic (implantable) cardiac defibrillator: Secondary | ICD-10-CM | POA: Insufficient documentation

## 2014-03-20 DIAGNOSIS — J45909 Unspecified asthma, uncomplicated: Secondary | ICD-10-CM | POA: Insufficient documentation

## 2014-03-20 DIAGNOSIS — I509 Heart failure, unspecified: Secondary | ICD-10-CM

## 2014-03-20 DIAGNOSIS — E119 Type 2 diabetes mellitus without complications: Secondary | ICD-10-CM

## 2014-03-20 DIAGNOSIS — G4733 Obstructive sleep apnea (adult) (pediatric): Secondary | ICD-10-CM | POA: Insufficient documentation

## 2014-03-20 DIAGNOSIS — I5022 Chronic systolic (congestive) heart failure: Secondary | ICD-10-CM | POA: Insufficient documentation

## 2014-03-20 DIAGNOSIS — IMO0001 Reserved for inherently not codable concepts without codable children: Secondary | ICD-10-CM | POA: Insufficient documentation

## 2014-03-20 DIAGNOSIS — I4729 Other ventricular tachycardia: Secondary | ICD-10-CM | POA: Insufficient documentation

## 2014-03-20 DIAGNOSIS — I10 Essential (primary) hypertension: Secondary | ICD-10-CM | POA: Insufficient documentation

## 2014-03-20 DIAGNOSIS — Z7901 Long term (current) use of anticoagulants: Secondary | ICD-10-CM | POA: Insufficient documentation

## 2014-03-20 DIAGNOSIS — Z9989 Dependence on other enabling machines and devices: Secondary | ICD-10-CM | POA: Insufficient documentation

## 2014-03-20 DIAGNOSIS — Z794 Long term (current) use of insulin: Secondary | ICD-10-CM | POA: Insufficient documentation

## 2014-03-20 DIAGNOSIS — E1165 Type 2 diabetes mellitus with hyperglycemia: Secondary | ICD-10-CM

## 2014-03-20 DIAGNOSIS — I472 Ventricular tachycardia, unspecified: Secondary | ICD-10-CM | POA: Insufficient documentation

## 2014-03-20 DIAGNOSIS — I428 Other cardiomyopathies: Secondary | ICD-10-CM | POA: Insufficient documentation

## 2014-03-20 MED ORDER — HYDRALAZINE HCL 25 MG PO TABS: 25.0000 mg | ORAL_TABLET | Freq: Three times a day (TID) | ORAL | Status: DC

## 2014-03-20 MED ORDER — POTASSIUM CHLORIDE CRYS ER 20 MEQ PO TBCR: 20.0000 meq | EXTENDED_RELEASE_TABLET | Freq: Every day | ORAL | Status: DC

## 2014-03-20 NOTE — Patient Instructions (Signed)
Hold your torsemide for 2 days. If your weight continues to trend down call the clinic. If weight is less than 338 lbs on home scale take 40 mg (2 tablets) in the morning and 20 mg (1 tablet) in the evening. If weight is greater than 338 lbs then you will take 40 mg (2 tablets) in the morning and 40 mg (2 tablets) in the evening.   Start taking potassium 20 meq (1 tablet) daily  Follow up in 3 weeks  Do the following things EVERYDAY: 1) Weigh yourself in the morning before breakfast. Write it down and keep it in a log. 2) Take your medicines as prescribed 3) Eat low salt foods-Limit salt (sodium) to 2000 mg per day.  4) Stay as active as you can everyday 5) Limit all fluids for the day to less than 2 liters 6)

## 2014-03-20 NOTE — Telephone Encounter (Signed)
Per pharmacy pt needed PA for Xarelto, called [phone], pt's ID #[id number], medication was approved through 03/14/15

## 2014-03-20 NOTE — Progress Notes (Addendum)
Patient ID: Gary Hutchinson, male   DOB: 12/26/1960, 53 y.o.   MRN: [id number]  PCP: Creola Corn EP: Hillis Range  HPI: Gary Hutchinson is a 53 year old male with a history of chronic systolic heart failure, ICM s/p ICD, HTN, DM II, OSA on CPAP, history of ventricular tachycardia status post AICD placement in 2009, atrial fibrillation and morbid obesity.    Admitted 6/1-6/8 for A/C HF and cardiogenic shock. Diuresed with IV lasix and milrinone which were weaned off.  He went into Afib with RVR and chemically cardioverted back to NSR with IV amiodarone. Placed on Xarelto. BB restarted 1/2 dose and held ACE-I. His blood sugars were elevated along with Hgb A1C and started on insulin. Discharge weight 344 lbs.   Post Hospital Follow up: Was admitted to the hospital 6/1-6/8 for A/C HF and cardiogenic shock. He required milrinone and lasix gtt. He also presented with new onset Afib with RVR and was placed on IV amiodarone and converted on his own. Started on insulin in the hospital. Overall feeling well. Followed up with PCP about DM2. Denies SOB, orthopnea, PND or CP. Following a low salt diet and drinking less than 2L a day. SBP at home 100-120s. Blood sugars 120-190s. Weight at home 336-337 lbs. PCP decreased hydralazine to 25 mg TID.   Optivol: Fluid below threshold. Thoracic impedence up. Patient activity 0-1 hrs a day.  Echo 05/03/12: EF 25-30%.  Grade 1 diastolic dysfunction.  Mild MR.  Mod dilated LA.          07/27/13: EF 40%, diff HK, MR mild, LA mild/mod dilated        03/2014: EF30-35%, grade II DD, mild MR, RV systolic fx mildly decreased   Labs  03/08/13 K 3.8 Creatinine 1.02  07/27/13 K 4.7 Creatinine 1.61 Dig level 0.9 ---> dig cut back to 0.125 mg every other day 10/10/13 K 3.7 Creatinine 1.4  03/17/14: K 3.8, Creatinine 1.8, BUN 29 (PCP office)  SH: Lives with son in [place]. Disabled. No ETOH or tobacco abuse  FH; Mother living: HTN        Father deceased: was not part of his life so not  sure health issues  ROS: All systems negative except as listed in HPI, PMH and Problem List.  Past Medical History  Diagnosis Date  . Cardiomyopathy     nonischemic  . Ventricular tachycardia     s/p MDT ICD implant  . Alcohol abuse     now quit  . Pulmonary hypertension   . Diverticulosis of colon   . Hemorrhoids, internal   . Cholecystitis   . Dyspepsia   . Morbid obesity   . Obstructive sleep apnea   . Anemia     iron defi  . Hypertension   . Gout   . Diabetes mellitus   . Chronic systolic congestive heart failure   . Asthma     Current Outpatient Prescriptions  Medication Sig Dispense Refill  . acetaminophen (TYLENOL) 325 MG tablet Take 2 tablets (650 mg total) by mouth every 6 (six) hours as needed.      Marland Kitchen albuterol (PROAIR HFA) 108 (90 BASE) MCG/ACT inhaler Inhale 2 puffs into the lungs every 6 (six) hours as needed. For wheezing or shortness of breath      . allopurinol (ZYLOPRIM) 100 MG tablet Take 2 tablets by mouth daily.       Marland Kitchen amiodarone (PACERONE) 200 MG tablet Take 1 tablet (200 mg total) by mouth 2 (two) times daily.  60 tablet  3  . aspirin 81 MG tablet Take 81 mg by mouth daily.      . bisoprolol (ZEBETA) 10 MG tablet Take 0.5 tablets (5 mg total) by mouth daily.  15 tablet  3  . colchicine 0.6 MG tablet Take 0.6 mg by mouth daily as needed. Gout flare ups      . digoxin (LANOXIN) 0.25 MG tablet Take 0.5 tablets (125 mcg total) by mouth every other day.  15 tablet  6  . fluticasone (FLONASE) 50 MCG/ACT nasal spray Place 2 sprays into the nose as needed.       . Fluticasone-Salmeterol (ADVAIR DISKUS) 250-50 MCG/DOSE AEPB Inhale 1 puff into the lungs every 12 (twelve) hours as needed. For wheezing or shortness of breath      . hydrALAZINE (APRESOLINE) 25 MG tablet Take 25 mg by mouth every 8 (eight) hours.      . insulin detemir (LEVEMIR) 100 UNIT/ML injection Inject 0.24 mLs (24 Units total) into the skin at bedtime.  10 mL  11  . isosorbide mononitrate  (IMDUR) 30 MG 24 hr tablet Take 1 tablet (30 mg total) by mouth 2 (two) times daily.  30 tablet  3  . linagliptin (TRADJENTA) 5 MG TABS tablet Take 1 tablet (5 mg total) by mouth daily.  30 tablet  3  . metolazone (ZAROXOLYN) 2.5 MG tablet Take 2.5 mg every Tue and Sat only if weight is greater than 340 lbs  12 tablet  3  . montelukast (SINGULAIR) 10 MG tablet Take 10 mg by mouth daily as needed. For shortness of breath or wheezing      . potassium chloride SA (K-DUR,KLOR-CON) 20 MEQ tablet Take 1 tablet (20 mEq total) by mouth 2 (two) times a week. Every Tue and Sat with Metolazone  12 tablet  3  . rivaroxaban (XARELTO) 20 MG TABS tablet Take 1 tablet (20 mg total) by mouth daily.  30 tablet  6  . simvastatin (ZOCOR) 40 MG tablet Take 1 tablet (40 mg total) by mouth at bedtime.  30 tablet  6  . spironolactone (ALDACTONE) 25 MG tablet Take 0.5 tablets (12.5 mg total) by mouth daily.  15 tablet  3  . torsemide (DEMADEX) 20 MG tablet Take 2 tablets (40 mg total) by mouth 2 (two) times daily.  120 tablet  3  . traMADol (ULTRAM) 50 MG tablet Take 1 tablet (50 mg total) by mouth every 8 (eight) hours as needed.  20 tablet  0   No current facility-administered medications for this encounter.    Filed Vitals:   03/20/14 1017  BP: 114/76  Pulse: 84  Resp: 18  Weight: 338 lb 6 oz (153.486 kg)  SpO2: 96%    PHYSICAL EXAM: General:  Well appearing. No resp difficulty HEENT: normal Neck: Thick. JVP difficult to assess d/t body habitus but does not appear elevated. Carotids 2+ bilaterally; no bruits. No lymphadenopathy or thryomegaly appreciated. Cor: PMI nonpalpable. Regular rate & rhythm. No rubs or gallops. 2/6 TR murmur. Lungs: clear Abdomen: obese, soft, nontender, nondistended.  Good bowel sounds. Extremities: no cyanosis, clubbing, rash, no edema. Neuro: alert & orientedx3, cranial nerves grossly intact. Moves all 4 extremities w/o difficulty. Affect pleasant.  ASSESSMENT & PLAN:   1)  Chronic systolic HF: NICM s/p ICD Medtronic, EF 30-35% (03/2014).   - Reviewed discharge summary and patient was just admitted to the hospital with A/C HF and cardiogenic shock in the setting of new onset A-fib with  RVR. Diuresed with IV lasix and milrinone and started on IV amiodarone and Xarelto. He spontaneously cardioverted and discharge weight was 344 lbs - NYHA II symptoms and volume status stable. Weight continues to trend down and labs from PCP Cr elevated slightly. Will have patient hold diuretics for 2 days and then go back to torsemide 40 mg BID. Discussed if weight less than 338 lbs to take 40 mg q am and 20 mg q pm. - Continue bisoprolol, digoxin and spiro at current doses. - Continue to hold ramipril with Cr 1.8 will try to restart low dose next visit.  - Optivol reviewed and way below threshold and thoracic impedence up. No Afib and patient activity 0-1 hr a day. - Reinforced the need and importance of daily weights, a low sodium diet, and fluid restriction (less than 2 L a day). Instructed to call the HF clinic if weight increases more than 3 lbs overnight or 5 lbs in a week.  2) HTN: controlled. Continue current medications 3) OSA  - Continue to wear CPAP nightly 4) VTach, s/p ICD 2009: no shocks from ICD 5) Obesity: Reinforced the need to lose weight and cut back on his portions. Encouraged to start exercising. 6) Afib - New to patient with last admission. Appears to be in NSR and no Afib on optivol. Will continue amiodarone 200 mg BID. He is on Xarelto 20 mg daily with no bleeding issues. 7) DM2 - Uncontrolled. Hgb A1C 9.6 with last admission and was started on insulin. Following with PCP.  F/U 3 weeks Aundria Rud NP-C 03/20/2014 10:28 AM

## 2014-03-23 ENCOUNTER — Encounter: Payer: Self-pay | Admitting: Internal Medicine

## 2014-04-04 ENCOUNTER — Telehealth (HOSPITAL_COMMUNITY): Payer: Medicare Other

## 2014-04-10 ENCOUNTER — Inpatient Hospital Stay (HOSPITAL_COMMUNITY): Admission: RE | Admit: 2014-04-10 | Payer: Medicare Other | Source: Ambulatory Visit

## 2014-04-10 ENCOUNTER — Encounter (HOSPITAL_COMMUNITY): Payer: Self-pay

## 2014-04-14 ENCOUNTER — Telehealth (HOSPITAL_COMMUNITY): Payer: Self-pay | Admitting: *Deleted

## 2014-04-14 MED ORDER — ATORVASTATIN CALCIUM 40 MG PO TABS: 40.0000 mg | ORAL_TABLET | Freq: Every day | ORAL | Status: DC

## 2014-04-14 NOTE — Telephone Encounter (Signed)
[facility] called to let us know pt is on amio and simva 40, discussed w/Dr [name] he would like pt to change simva to atorva 40, he called and discussed this with Dr [name], pt's pcp, who is in agreement, new rx sent in

## 2014-04-17 NOTE — Progress Notes (Signed)
Patient ID: Gary Hutchinson, male   DOB: July 07, 1961, 53 y.o.   MRN: [id number]  PCP: Creola Corn EP: Hillis Range  HPI: Gary Hutchinson is a 53 year old male with a history of chronic systolic heart failure, ICM s/p ICD, HTN, DM II, OSA on CPAP, history of ventricular tachycardia status post AICD placement in 2009, atrial fibrillation and morbid obesity.    Admitted 6/1-03/13/14 for A/C HF and cardiogenic shock. Diuresed with IV lasix and milrinone which were weaned off.  He went into Afib with RVR and chemically cardioverted back to NSR with IV amiodarone. Placed on Xarelto. BB restarted 1/2 dose and held ACE-I. His blood sugars were elevated along with Hgb A1C and started on insulin. Discharge weight 344 lbs.   Follow up for Heart Failure: Last visit volume he was dry and diuretics held for 2 days and decreased diuretics to 40 mg q am and 20 mg q pm if weight less than 338 lbs and take 40 mg BID if weight greater than 338 lbs. Overall feeling pretty good. Having bowel issues. Denies SOB, orthopnea, PND, CP or palpitations. Trying to follow a low salt diet and drink less than 2L a day. Blood sugars running 150-170s. Weight at home 333 lbs.   Optivol: Fluid below threshold but trending up. Thoracic impedence down. Patient activity 0-1 hrs a day.  Echo 05/03/12: EF 25-30%.  Grade 1 diastolic dysfunction.  Mild MR.  Mod dilated LA.          07/27/13: EF 40%, diff HK, MR mild, LA mild/mod dilated        03/2014: EF30-35%, grade II DD, mild MR, RV systolic fx mildly decreased   Labs  03/08/13 K 3.8 Creatinine 1.02  07/27/13 K 4.7 Creatinine 1.61 Dig level 0.9 ---> dig cut back to 0.125 mg every other day 10/10/13 K 3.7 Creatinine 1.4  03/17/14: K 3.8, Creatinine 1.8, BUN 29 (PCP office)  SH: Lives with son in [place]. Disabled. No ETOH or tobacco abuse  FH; Mother living: HTN        Father deceased: was not part of his life so not sure health issues  ROS: All systems negative except as listed in HPI, PMH  and Problem List.  Past Medical History  Diagnosis Date  . Cardiomyopathy     nonischemic  . Ventricular tachycardia     s/p MDT ICD implant  . Alcohol abuse     now quit  . Pulmonary hypertension   . Diverticulosis of colon   . Hemorrhoids, internal   . Cholecystitis   . Dyspepsia   . Morbid obesity   . Obstructive sleep apnea   . Anemia     iron defi  . Hypertension   . Gout   . Diabetes mellitus   . Chronic systolic congestive heart failure   . Asthma     Current Outpatient Prescriptions  Medication Sig Dispense Refill  . acetaminophen (TYLENOL) 325 MG tablet Take 2 tablets (650 mg total) by mouth every 6 (six) hours as needed.      Marland Kitchen albuterol (PROAIR HFA) 108 (90 BASE) MCG/ACT inhaler Inhale 2 puffs into the lungs every 6 (six) hours as needed. For wheezing or shortness of breath      . allopurinol (ZYLOPRIM) 100 MG tablet Take 2 tablets by mouth daily.       Marland Kitchen amiodarone (PACERONE) 200 MG tablet Take 1 tablet (200 mg total) by mouth 2 (two) times daily.  60 tablet  3  .  aspirin 81 MG tablet Take 81 mg by mouth daily.      Marland Kitchen atorvastatin (LIPITOR) 40 MG tablet Take 1 tablet (40 mg total) by mouth daily.  30 tablet  6  . bisoprolol (ZEBETA) 10 MG tablet Take 0.5 tablets (5 mg total) by mouth daily.  15 tablet  3  . colchicine 0.6 MG tablet Take 0.6 mg by mouth daily as needed. Gout flare ups      . digoxin (LANOXIN) 0.25 MG tablet Take 0.5 tablets (125 mcg total) by mouth every other day.  15 tablet  6  . fluticasone (FLONASE) 50 MCG/ACT nasal spray Place 2 sprays into the nose as needed.       . Fluticasone-Salmeterol (ADVAIR DISKUS) 250-50 MCG/DOSE AEPB Inhale 1 puff into the lungs every 12 (twelve) hours as needed. For wheezing or shortness of breath      . hydrALAZINE (APRESOLINE) 25 MG tablet Take 1 tablet (25 mg total) by mouth every 8 (eight) hours.  90 tablet  3  . insulin detemir (LEVEMIR) 100 UNIT/ML injection Inject 28 Units into the skin at bedtime.      .  isosorbide mononitrate (IMDUR) 30 MG 24 hr tablet Take 1 tablet (30 mg total) by mouth 2 (two) times daily.  30 tablet  3  . linagliptin (TRADJENTA) 5 MG TABS tablet Take 1 tablet (5 mg total) by mouth daily.  30 tablet  3  . metolazone (ZAROXOLYN) 2.5 MG tablet Take 2.5 mg every Tue and Sat only if weight is greater than 340 lbs  12 tablet  3  . montelukast (SINGULAIR) 10 MG tablet Take 10 mg by mouth daily as needed. For shortness of breath or wheezing      . potassium chloride SA (K-DUR,KLOR-CON) 20 MEQ tablet Take 1 tablet (20 mEq total) by mouth daily.  30 tablet  3  . rivaroxaban (XARELTO) 20 MG TABS tablet Take 1 tablet (20 mg total) by mouth daily.  30 tablet  6  . spironolactone (ALDACTONE) 25 MG tablet Take 0.5 tablets (12.5 mg total) by mouth daily.  15 tablet  3  . torsemide (DEMADEX) 20 MG tablet Take 2 tablets (40 mg total) by mouth 2 (two) times daily.  120 tablet  3  . traMADol (ULTRAM) 50 MG tablet Take 1 tablet (50 mg total) by mouth every 8 (eight) hours as needed.  20 tablet  0   No current facility-administered medications for this encounter.    Filed Vitals:   04/18/14 0827  BP: 128/66  Pulse: 81  Resp: 18  Weight: 334 lb 4 oz (151.615 kg)  SpO2: 96%    PHYSICAL EXAM: General:  Well appearing. No resp difficulty HEENT: normal Neck: Thick. JVP difficult to assess d/t body habitus but appears mildly elevated. Carotids 2+ bilaterally; no bruits. No lymphadenopathy or thryomegaly appreciated. Cor: PMI nonpalpable. Regular rate & rhythm. No rubs or gallops. 2/6 TR murmur. Lungs: clear Abdomen: obese, soft, nontender, mildly distended.  Good bowel sounds. Extremities: no cyanosis, clubbing, rash, no edema. Neuro: alert & orientedx3, cranial nerves grossly intact. Moves all 4 extremities w/o difficulty. Affect pleasant.  ASSESSMENT & PLAN:   1) Chronic systolic HF: NICM s/p ICD Medtronic, EF 30-35% (03/2014).   - NYHA II symptoms and volume status slightly elevated.  Will change dry weight to 330 lbs. If weight greater than 330 lbs patient to take torsemide 40 gm BID and if weight less than 329 lbs take 40 mg q am and 20  mg q pm. - Continue bisoprolol, digoxin and spiro at current doses. - Continue to hold ramipril until BMET today. - Increase hydralazine to 37.5 mg TID and continue Imdur 30 mg BID. - Optivol reviewed and below threshold but trending up and thoracic impedence down. No Afib and patient activity 0-1 hr a day. - Reinforced the need and importance of daily weights, a low sodium diet, and fluid restriction (less than 2 L a day). Instructed to call the HF clinic if weight increases more than 3 lbs overnight or 5 lbs in a week.  2) HTN: controlled. Continue current medications 3) OSA  - Continue to wear CPAP nightly 4) VTach, s/p ICD 2009: no shocks from ICD 5) Obesity: Reinforced the need to lose weight and cut back on his portions. Encouraged to start exercising. He is down 4 lbs from last visit and congratulated patient.  6) PAF - New to patient with last admission, 03/2014. Appears to be in NSR and no Afib on optivol. Will continue amiodarone 200 mg BID, consider decreasing to 200 mg daily next visit. He is on Xarelto 20 mg daily with no bleeding issues. 7) DM2 - Uncontrolled. Hgb A1C 9.6 (03/2014). Following with PCP and reports sugars better controlled which I praised patient for.   F/U 1 month Aundria RudCosgrove, Koa Palla B NP-C 04/18/2014 8:50 AM

## 2014-04-18 ENCOUNTER — Telehealth (HOSPITAL_COMMUNITY): Payer: Self-pay

## 2014-04-18 ENCOUNTER — Ambulatory Visit (HOSPITAL_COMMUNITY)
Admission: RE | Admit: 2014-04-18 | Discharge: 2014-04-18 | Disposition: A | Discharge status: Home / Self Care | Payer: Medicare Other | Source: Ambulatory Visit | Attending: Anesthesiology | Admitting: Anesthesiology

## 2014-04-18 ENCOUNTER — Encounter (HOSPITAL_COMMUNITY): Payer: Self-pay

## 2014-04-18 VITALS — BP 128/66 | HR 81 | Resp 18 | Wt 334.2 lb

## 2014-04-18 DIAGNOSIS — Z79899 Other long term (current) drug therapy: Secondary | ICD-10-CM | POA: Insufficient documentation

## 2014-04-18 DIAGNOSIS — I5022 Chronic systolic (congestive) heart failure: Secondary | ICD-10-CM

## 2014-04-18 DIAGNOSIS — E669 Obesity, unspecified: Secondary | ICD-10-CM | POA: Diagnosis not present

## 2014-04-18 DIAGNOSIS — G4733 Obstructive sleep apnea (adult) (pediatric): Secondary | ICD-10-CM

## 2014-04-18 DIAGNOSIS — I4891 Unspecified atrial fibrillation: Secondary | ICD-10-CM | POA: Insufficient documentation

## 2014-04-18 DIAGNOSIS — IMO0001 Reserved for inherently not codable concepts without codable children: Secondary | ICD-10-CM | POA: Diagnosis not present

## 2014-04-18 DIAGNOSIS — I10 Essential (primary) hypertension: Secondary | ICD-10-CM

## 2014-04-18 DIAGNOSIS — I472 Ventricular tachycardia, unspecified: Secondary | ICD-10-CM

## 2014-04-18 DIAGNOSIS — Z95810 Presence of automatic (implantable) cardiac defibrillator: Secondary | ICD-10-CM | POA: Insufficient documentation

## 2014-04-18 DIAGNOSIS — I4729 Other ventricular tachycardia: Secondary | ICD-10-CM

## 2014-04-18 DIAGNOSIS — I428 Other cardiomyopathies: Secondary | ICD-10-CM | POA: Diagnosis not present

## 2014-04-18 DIAGNOSIS — Z794 Long term (current) use of insulin: Secondary | ICD-10-CM | POA: Insufficient documentation

## 2014-04-18 DIAGNOSIS — J45909 Unspecified asthma, uncomplicated: Secondary | ICD-10-CM | POA: Diagnosis not present

## 2014-04-18 DIAGNOSIS — E1165 Type 2 diabetes mellitus with hyperglycemia: Secondary | ICD-10-CM

## 2014-04-18 DIAGNOSIS — I509 Heart failure, unspecified: Secondary | ICD-10-CM

## 2014-04-18 DIAGNOSIS — I480 Paroxysmal atrial fibrillation: Secondary | ICD-10-CM

## 2014-04-18 HISTORY — DX: Other cardiomyopathies: I42.8

## 2014-04-18 LAB — BASIC METABOLIC PANEL
Anion gap: 18 — ABNORMAL HIGH (ref 5–15)
BUN: 38 mg/dL — ABNORMAL HIGH (ref 6–23)
CALCIUM: 9.7 mg/dL (ref 8.4–10.5)
CO2: 24 meq/L (ref 19–32)
Chloride: 98 mEq/L (ref 96–112)
Creatinine, Ser: 2.18 mg/dL — ABNORMAL HIGH (ref 0.50–1.35)
GFR calc non Af Amer: 33 mL/min — ABNORMAL LOW (ref >90)
GFR, EST AFRICAN AMERICAN: 38 mL/min — AB (ref >90)
Glucose, Bld: 182 mg/dL — ABNORMAL HIGH (ref 70–99)
Potassium: 3.9 mEq/L (ref 3.7–5.3)
Sodium: 140 mEq/L (ref 137–147)

## 2014-04-18 MED ORDER — TORSEMIDE 20 MG PO TABS: ORAL_TABLET | ORAL | Status: DC

## 2014-04-18 MED ORDER — HYDRALAZINE HCL 25 MG PO TABS: 37.5000 mg | ORAL_TABLET | Freq: Three times a day (TID) | ORAL | Status: DC

## 2014-04-18 NOTE — Patient Instructions (Addendum)
Doing well.  If weight is greater than 330 lbs or more take torsemide 40 mg twice a day. If weight less than 329 lbs take 40 mg in the morning and 20 mg in the afternoon.   Today take 40 mg twice a day.  Increase your hydralazine to 37.5 mg (1 1/2 tablets) three times a day.  Follow up 1 month with MD  Do the following things EVERYDAY: 1) Weigh yourself in the morning before breakfast. Write it down and keep it in a log. 2) Take your medicines as prescribed 3) Eat low salt foods-Limit salt (sodium) to 2000 mg per day.  4) Stay as active as you can everyday 5) Limit all fluids for the day to less than 2 liters 6)

## 2014-04-18 NOTE — Telephone Encounter (Signed)
Lab results reviewed with patient.  Instructed to hold torsemide x 2 days, then decrease daily dose to 40mg  in am and 20mg  in pm.  Will return next week for lab work to recheck BMET.

## 2014-04-19 ENCOUNTER — Ambulatory Visit (INDEPENDENT_AMBULATORY_CARE_PROVIDER_SITE_OTHER): Payer: Medicare Other | Admitting: *Deleted

## 2014-04-19 DIAGNOSIS — I5022 Chronic systolic (congestive) heart failure: Secondary | ICD-10-CM

## 2014-04-19 DIAGNOSIS — I428 Other cardiomyopathies: Secondary | ICD-10-CM

## 2014-04-19 DIAGNOSIS — I509 Heart failure, unspecified: Secondary | ICD-10-CM

## 2014-04-19 LAB — MDC_IDC_ENUM_SESS_TYPE_REMOTE
Battery Voltage: 2.92 V
Brady Statistic RV Percent Paced: 0.01 %
Date Time Interrogation Session: 20150715150337
HighPow Impedance: 92 Ohm
Lead Channel Impedance Value: 348 Ohm
Lead Channel Sensing Intrinsic Amplitude: 7.5046
Lead Channel Setting Pacing Amplitude: 2.5 V
Lead Channel Setting Sensing Sensitivity: 0.3 mV
MDC IDC SET LEADCHNL RV PACING PULSEWIDTH: 0.4 ms
Zone Setting Detection Interval: 300 ms
Zone Setting Detection Interval: 340 ms
Zone Setting Detection Interval: 400 ms

## 2014-04-19 NOTE — Progress Notes (Signed)
Remote ICD transmission.   

## 2014-04-21 ENCOUNTER — Encounter: Payer: Self-pay | Admitting: Internal Medicine

## 2014-04-25 ENCOUNTER — Other Ambulatory Visit (HOSPITAL_COMMUNITY): Payer: Medicare Other

## 2014-04-26 ENCOUNTER — Encounter: Payer: Self-pay | Admitting: Cardiology

## 2014-04-27 ENCOUNTER — Ambulatory Visit (HOSPITAL_COMMUNITY)
Admission: RE | Admit: 2014-04-27 | Discharge: 2014-04-27 | Disposition: A | Discharge status: Home / Self Care | Payer: Medicare Other | Source: Ambulatory Visit | Attending: Cardiology | Admitting: Cardiology

## 2014-04-27 DIAGNOSIS — I5022 Chronic systolic (congestive) heart failure: Secondary | ICD-10-CM | POA: Diagnosis present

## 2014-04-27 DIAGNOSIS — I509 Heart failure, unspecified: Secondary | ICD-10-CM | POA: Diagnosis not present

## 2014-04-27 LAB — BASIC METABOLIC PANEL
ANION GAP: 21 — AB (ref 5–15)
BUN: 38 mg/dL — ABNORMAL HIGH (ref 6–23)
CHLORIDE: 99 meq/L (ref 96–112)
CO2: 19 meq/L (ref 19–32)
Calcium: 9.2 mg/dL (ref 8.4–10.5)
Creatinine, Ser: 1.95 mg/dL — ABNORMAL HIGH (ref 0.50–1.35)
GFR calc Af Amer: 43 mL/min — ABNORMAL LOW (ref >90)
GFR calc non Af Amer: 37 mL/min — ABNORMAL LOW (ref >90)
GLUCOSE: 163 mg/dL — AB (ref 70–99)
Potassium: 4.3 mEq/L (ref 3.7–5.3)
SODIUM: 139 meq/L (ref 137–147)

## 2014-04-27 LAB — PRO B NATRIURETIC PEPTIDE: PRO B NATRI PEPTIDE: 4849 pg/mL — AB (ref 0–125)

## 2014-04-28 ENCOUNTER — Encounter: Payer: Self-pay | Admitting: Internal Medicine

## 2014-05-07 ENCOUNTER — Other Ambulatory Visit (HOSPITAL_COMMUNITY): Payer: Self-pay | Admitting: Anesthesiology

## 2014-05-08 ENCOUNTER — Encounter: Payer: Self-pay | Admitting: Internal Medicine

## 2014-05-09 ENCOUNTER — Ambulatory Visit (HOSPITAL_COMMUNITY)
Admission: RE | Admit: 2014-05-09 | Discharge: 2014-05-09 | Disposition: A | Discharge status: Home / Self Care | Payer: Medicare Other | Source: Ambulatory Visit | Attending: Internal Medicine | Admitting: Internal Medicine

## 2014-05-18 ENCOUNTER — Encounter (HOSPITAL_COMMUNITY): Payer: Self-pay | Admitting: *Deleted

## 2014-05-18 ENCOUNTER — Ambulatory Visit (HOSPITAL_COMMUNITY)
Admission: RE | Admit: 2014-05-18 | Discharge: 2014-05-18 | Disposition: A | Discharge status: Home / Self Care | Payer: Medicare Other | Source: Ambulatory Visit | Attending: Internal Medicine | Admitting: Internal Medicine

## 2014-05-18 ENCOUNTER — Encounter (HOSPITAL_COMMUNITY): Payer: Medicare Other

## 2014-05-18 VITALS — BP 96/58 | HR 71 | Wt 338.0 lb

## 2014-05-18 DIAGNOSIS — F1011 Alcohol abuse, in remission: Secondary | ICD-10-CM | POA: Diagnosis not present

## 2014-05-18 DIAGNOSIS — G4733 Obstructive sleep apnea (adult) (pediatric): Secondary | ICD-10-CM | POA: Insufficient documentation

## 2014-05-18 DIAGNOSIS — M109 Gout, unspecified: Secondary | ICD-10-CM | POA: Insufficient documentation

## 2014-05-18 DIAGNOSIS — J45909 Unspecified asthma, uncomplicated: Secondary | ICD-10-CM | POA: Diagnosis not present

## 2014-05-18 DIAGNOSIS — E119 Type 2 diabetes mellitus without complications: Secondary | ICD-10-CM | POA: Diagnosis not present

## 2014-05-18 DIAGNOSIS — Z7982 Long term (current) use of aspirin: Secondary | ICD-10-CM | POA: Insufficient documentation

## 2014-05-18 DIAGNOSIS — N189 Chronic kidney disease, unspecified: Secondary | ICD-10-CM

## 2014-05-18 DIAGNOSIS — I10 Essential (primary) hypertension: Secondary | ICD-10-CM | POA: Diagnosis not present

## 2014-05-18 DIAGNOSIS — I509 Heart failure, unspecified: Secondary | ICD-10-CM | POA: Diagnosis not present

## 2014-05-18 DIAGNOSIS — N179 Acute kidney failure, unspecified: Secondary | ICD-10-CM | POA: Insufficient documentation

## 2014-05-18 DIAGNOSIS — D509 Iron deficiency anemia, unspecified: Secondary | ICD-10-CM | POA: Diagnosis not present

## 2014-05-18 DIAGNOSIS — N183 Chronic kidney disease, stage 3 unspecified: Secondary | ICD-10-CM | POA: Insufficient documentation

## 2014-05-18 DIAGNOSIS — I5022 Chronic systolic (congestive) heart failure: Secondary | ICD-10-CM | POA: Diagnosis present

## 2014-05-18 DIAGNOSIS — I4891 Unspecified atrial fibrillation: Secondary | ICD-10-CM | POA: Diagnosis not present

## 2014-05-18 LAB — PROTIME-INR
INR: 4.11 — ABNORMAL HIGH (ref 0.00–1.49)
PROTHROMBIN TIME: 39.8 s — AB (ref 11.6–15.2)

## 2014-05-18 LAB — BASIC METABOLIC PANEL
ANION GAP: 20 — AB (ref 5–15)
BUN: 34 mg/dL — ABNORMAL HIGH (ref 6–23)
CHLORIDE: 101 meq/L (ref 96–112)
CO2: 21 meq/L (ref 19–32)
Calcium: 9.5 mg/dL (ref 8.4–10.5)
Creatinine, Ser: 1.8 mg/dL — ABNORMAL HIGH (ref 0.50–1.35)
GFR calc non Af Amer: 41 mL/min — ABNORMAL LOW (ref >90)
GFR, EST AFRICAN AMERICAN: 48 mL/min — AB (ref >90)
Glucose, Bld: 167 mg/dL — ABNORMAL HIGH (ref 70–99)
POTASSIUM: 4.1 meq/L (ref 3.7–5.3)
SODIUM: 142 meq/L (ref 137–147)

## 2014-05-18 LAB — DIGOXIN LEVEL: DIGOXIN LVL: 0.7 ng/mL — AB (ref 0.8–2.0)

## 2014-05-18 LAB — CBC
HEMATOCRIT: 42.1 % (ref 39.0–52.0)
HEMOGLOBIN: 13.5 g/dL (ref 13.0–17.0)
MCH: 28.2 pg (ref 26.0–34.0)
MCHC: 32.1 g/dL (ref 30.0–36.0)
MCV: 88.1 fL (ref 78.0–100.0)
Platelets: 163 10*3/uL (ref 150–400)
RBC: 4.78 MIL/uL (ref 4.22–5.81)
RDW: 16.7 % — ABNORMAL HIGH (ref 11.5–15.5)
WBC: 7.3 10*3/uL (ref 4.0–10.5)

## 2014-05-18 MED ORDER — TORSEMIDE 20 MG PO TABS: 40.0000 mg | ORAL_TABLET | Freq: Two times a day (BID) | ORAL | Status: DC

## 2014-05-18 MED ORDER — AMIODARONE HCL 200 MG PO TABS: 200.0000 mg | ORAL_TABLET | Freq: Every day | ORAL | Status: DC

## 2014-05-18 MED ORDER — RIVAROXABAN 15 MG PO TABS
15.0000 mg | ORAL_TABLET | Freq: Every day | ORAL | Status: DC
Start: 2014-05-18 — End: 2015-10-19

## 2014-05-18 NOTE — Patient Instructions (Signed)
Stop Aspirin  Decrease Amiodarone to 200 mg daily  Decrease Xarelto to 15 mg daily  Increase Torsemide to 40 mg (2 tabs) Twice daily   Labs today  Heart cath on Tue 05/23/14, see instruction sheet  Your physician recommends that you schedule a follow-up appointment in: 2 weeks

## 2014-05-18 NOTE — Progress Notes (Signed)
Gary Hutchinson ID: Gary Hutchinson, male   DOB: October 29, 1960, 53 y.o.   MRN: [id number]  PCP: Creola Corn EP: Hillis Range  HPI: [name] is a 53 year old male with a history of chronic systolic heart failure, ICM s/p ICD, HTN, DM II, OSA on CPAP, history of ventricular tachycardia status post AICD placement in 2009, atrial fibrillation and morbid obesity.    Admitted 6/1-03/13/14 for A/C HF and cardiogenic shock (co-ox 42%). Diuresed with IV lasix and milrinone which were weaned off.  He went into Afib with RVR and chemically cardioverted back to NSR with IV amiodarone. Placed on Xarelto. BB restarted 1/2 dose and held ACE-I. His blood sugars were elevated along with Hgb A1C and started on insulin. Discharge weight 344 lbs.   Follow up for Heart Failure: He returns for one month f/u/ At last visit dry weight increased to 330. BMET checked at that time and Cr up to 1.95. Demdex held for 2 days then cut back to 40/20. Renal function continued to get worse and Dr. [name] cut back diuretics again and planned referred to Renal. Now taking torsemide 30 (1.5 tabs) in am and 20 pm. Weight at home up about 3 pounds. Says breathing OK. Feels bloated.  Denies orthopnea, PND, CP or palpitations. Trying to follow a low salt diet and drink less than 2L a day.   Optivol: Fluid far above threshold since July 10.  Thoracic impedence down. Gary Hutchinson activity 0-1 hrs a day. No AF or VT.   Echo 05/03/12: EF 25-30%.  Grade 1 diastolic dysfunction.  Mild MR.  Mod dilated LA.          07/27/13: EF 40%, diff HK, MR mild, LA mild/mod dilated        03/2014: EF30-35%, grade II DD, mild MR, RV systolic fx mildly decreased   Labs  03/08/13 K 3.8 Creatinine 1.02  07/27/13 K 4.7 Creatinine 1.61 Dig level 0.9 ---> dig cut back to 0.125 mg every other day 10/10/13 K 3.7 Creatinine 1.4  03/17/14: K 3.8, Creatinine 1.8, BUN 29 (PCP office) 05/10/14: K 4.0 Cr 2.4  SH: Lives with son in [place]. Disabled. No ETOH or tobacco abuse  FH; Mother  living: HTN        Father deceased: was not part of his life so not sure health issues  ROS: All systems negative except as listed in HPI, PMH and Problem List.  Past Medical History  Diagnosis Date  . Nonischemic cardiomyopathy     a. LHC (02/2011) normal coronaries  . Ventricular tachycardia     s/p MDT ICD implant  . Alcohol abuse     now quit  . Pulmonary hypertension   . Diverticulosis of colon   . Hemorrhoids, internal   . Cholecystitis   . Morbid obesity   . Obstructive sleep apnea   . Anemia     iron defi  . Hypertension   . Gout   . Diabetes mellitus   . Chronic systolic congestive heart failure     a. RHC (02/2011) RA 31/27, RV 71/27, PA67/45, PCWP 46, PA 49%, Fick CO: 3.4 b. ECHO (03/2014) EF 30-35%, grade II DD, mild MR, RV poorly visualized appears mildly decreased   . Asthma     Current Outpatient Prescriptions  Medication Sig Dispense Refill  . acetaminophen (TYLENOL) 325 MG tablet Take 2 tablets (650 mg total) by mouth every 6 (six) hours as needed.      Marland Kitchen albuterol (PROAIR HFA) 108 (90 BASE) MCG/ACT  inhaler Inhale 2 puffs into the lungs every 6 (six) hours as needed. For wheezing or shortness of breath      . allopurinol (ZYLOPRIM) 100 MG tablet Take 2 tablets by mouth daily.       Marland Kitchen amiodarone (PACERONE) 200 MG tablet Take 1 tablet (200 mg total) by mouth 2 (two) times daily.  60 tablet  3  . aspirin 81 MG tablet Take 81 mg by mouth daily.      Marland Kitchen atorvastatin (LIPITOR) 40 MG tablet Take 1 tablet (40 mg total) by mouth daily.  30 tablet  6  . bisoprolol (ZEBETA) 10 MG tablet Take 0.5 tablets (5 mg total) by mouth daily.  15 tablet  3  . colchicine 0.6 MG tablet Take 0.6 mg by mouth daily as needed. Gout flare ups      . digoxin (LANOXIN) 0.25 MG tablet Take 0.5 tablets (125 mcg total) by mouth every other day.  15 tablet  6  . fluticasone (FLONASE) 50 MCG/ACT nasal spray Place 2 sprays into the nose as needed.       . Fluticasone-Salmeterol (ADVAIR DISKUS)  250-50 MCG/DOSE AEPB Inhale 1 puff into the lungs every 12 (twelve) hours as needed. For wheezing or shortness of breath      . hydrALAZINE (APRESOLINE) 25 MG tablet Take 1.5 tablets (37.5 mg total) by mouth every 8 (eight) hours.  135 tablet  3  . insulin detemir (LEVEMIR) 100 UNIT/ML injection Inject 28 Units into the skin at bedtime.      . isosorbide mononitrate (IMDUR) 30 MG 24 hr tablet TAKE 1 TABLET BY MOUTH TWICE A DAY  30 tablet  3  . linagliptin (TRADJENTA) 5 MG TABS tablet Take 1 tablet (5 mg total) by mouth daily.  30 tablet  3  . metolazone (ZAROXOLYN) 2.5 MG tablet Take 2.5 mg every Tue and Sat only if weight is greater than 340 lbs  12 tablet  3  . montelukast (SINGULAIR) 10 MG tablet Take 10 mg by mouth daily as needed. For shortness of breath or wheezing      . potassium chloride SA (K-DUR,KLOR-CON) 20 MEQ tablet Take 1 tablet (20 mEq total) by mouth daily.  30 tablet  3  . rivaroxaban (XARELTO) 20 MG TABS tablet Take 1 tablet (20 mg total) by mouth daily.  30 tablet  6  . spironolactone (ALDACTONE) 25 MG tablet Take 0.5 tablets (12.5 mg total) by mouth daily.  15 tablet  3  . torsemide (DEMADEX) 20 MG tablet Take 40 mg (2 tablets) every morning and 20 mg (1 tablet) every evening  120 tablet  3  . traMADol (ULTRAM) 50 MG tablet Take 1 tablet (50 mg total) by mouth every 8 (eight) hours as needed.  20 tablet  0   No current facility-administered medications for this encounter.    Filed Vitals:   05/18/14 1041  BP: 96/58  Pulse: 71  Weight: 338 lb (153.316 kg)  SpO2: 97%    PHYSICAL EXAM: General:  Fatigued appearing. No resp difficulty HEENT: normal Neck: Thick. JVP difficult to assess d/t body habitus but appears to jaw Carotids 2+ bilaterally; no bruits. No lymphadenopathy or thryomegaly appreciated. Cor: PMI nonpalpable. Regular rate & rhythm. No rubs or gallops. 2/6 TR murmur. 2/6 MR Lungs: clear Abdomen: obese, soft, nontender, + distended.  Good bowel  sounds. Extremities: no cyanosis, clubbing, rash, no edema. Neuro: alert & orientedx3, cranial nerves grossly intact. Moves all 4 extremities w/o difficulty. Affect pleasant.  ASSESSMENT & PLAN:   1) Chronic systolic HF: NICM s/p ICD Medtronic, EF 30-35% (03/2014).   - Symptomatically stable NYHA II-III but has progressive volume overload and renal failure with poor functional capacity on ICD accelerometer. Overall I suspect he has low output HF with cardio renal syndrome (versus intrinsic progressive renal failure) - We had a long talk about the options and I have recommended RHC to help evaluate cardiac output and filling pressures and need for inotropes. I do not think he would be candidate for VAD.  - Will increase torsemide back to 40 bid - Continue bisoprolol, digoxin and spiro at current doses. - Continue to hold ramipril  - Continue hydralazine 37.5 mg TID and continue Imdur 30 mg BID. - Check labs today including digoxin level - Optivol reviewed and below threshold but trending up and thoracic impedence down. No Afib and Gary Hutchinson activity 0-1 hr a day. - Reinforced the need and importance of daily weights, a low sodium diet, and fluid restriction (less than 2 L a day). Instructed to call the HF clinic if weight increases more than 3 lbs overnight or 5 lbs in a week.  2) Acute on chronic renal failure - suspect cardiorenal syndrome. Will need RHC. Agree with referral to Nephrology. 3) HTN: controlled. Continue current medications 4) OSA  - Continue to wear CPAP nightly 5) PAF - New to Gary Hutchinson 03/2014. Appears to be in NSR and no Afib on optivol. Continue amio to 200 bid. Decrease Xarelto to 15mg  daily given renal failure. 7) DM2 - Uncontrolled. Hgb A1C 9.6 (03/2014). Following with PCP and reports sugars better controlled which I praised Gary Hutchinson for.    Total time spent 45 minutes. Over half that time spent discussing above.   Arvilla Meresaniel [name] MD 05/18/2014 11:29 AM

## 2014-05-19 ENCOUNTER — Encounter (HOSPITAL_COMMUNITY): Payer: Self-pay | Admitting: Pharmacy Technician

## 2014-05-19 ENCOUNTER — Other Ambulatory Visit: Payer: Self-pay | Admitting: *Deleted

## 2014-05-23 ENCOUNTER — Ambulatory Visit (HOSPITAL_COMMUNITY)
Admission: RE | Admit: 2014-05-23 | Discharge: 2014-05-23 | Disposition: A | Discharge status: Home / Self Care | Payer: Medicare Other | Source: Ambulatory Visit | Attending: Internal Medicine | Admitting: Internal Medicine

## 2014-05-23 ENCOUNTER — Encounter (HOSPITAL_COMMUNITY)
Admission: RE | Disposition: A | Discharge status: Home / Self Care | Payer: Self-pay | Source: Ambulatory Visit | Attending: Internal Medicine

## 2014-05-23 DIAGNOSIS — I509 Heart failure, unspecified: Secondary | ICD-10-CM | POA: Insufficient documentation

## 2014-05-23 DIAGNOSIS — I2789 Other specified pulmonary heart diseases: Secondary | ICD-10-CM | POA: Diagnosis not present

## 2014-05-23 DIAGNOSIS — N189 Chronic kidney disease, unspecified: Secondary | ICD-10-CM | POA: Diagnosis not present

## 2014-05-23 DIAGNOSIS — G4733 Obstructive sleep apnea (adult) (pediatric): Secondary | ICD-10-CM | POA: Insufficient documentation

## 2014-05-23 DIAGNOSIS — Z95810 Presence of automatic (implantable) cardiac defibrillator: Secondary | ICD-10-CM | POA: Insufficient documentation

## 2014-05-23 DIAGNOSIS — M109 Gout, unspecified: Secondary | ICD-10-CM | POA: Insufficient documentation

## 2014-05-23 DIAGNOSIS — E119 Type 2 diabetes mellitus without complications: Secondary | ICD-10-CM | POA: Insufficient documentation

## 2014-05-23 DIAGNOSIS — I5022 Chronic systolic (congestive) heart failure: Secondary | ICD-10-CM | POA: Diagnosis not present

## 2014-05-23 DIAGNOSIS — I4891 Unspecified atrial fibrillation: Secondary | ICD-10-CM | POA: Diagnosis not present

## 2014-05-23 DIAGNOSIS — Z6841 Body Mass Index (BMI) 40.0 and over, adult: Secondary | ICD-10-CM | POA: Diagnosis not present

## 2014-05-23 DIAGNOSIS — Z79899 Other long term (current) drug therapy: Secondary | ICD-10-CM | POA: Diagnosis not present

## 2014-05-23 DIAGNOSIS — Z7982 Long term (current) use of aspirin: Secondary | ICD-10-CM | POA: Diagnosis not present

## 2014-05-23 DIAGNOSIS — J45909 Unspecified asthma, uncomplicated: Secondary | ICD-10-CM | POA: Insufficient documentation

## 2014-05-23 DIAGNOSIS — I130 Hypertensive heart and chronic kidney disease with heart failure and stage 1 through stage 4 chronic kidney disease, or unspecified chronic kidney disease: Secondary | ICD-10-CM | POA: Diagnosis not present

## 2014-05-23 DIAGNOSIS — Z794 Long term (current) use of insulin: Secondary | ICD-10-CM | POA: Insufficient documentation

## 2014-05-23 DIAGNOSIS — I428 Other cardiomyopathies: Secondary | ICD-10-CM | POA: Diagnosis not present

## 2014-05-23 HISTORY — PX: RIGHT HEART CATHETERIZATION: SHX5447

## 2014-05-23 LAB — GLUCOSE, CAPILLARY
Glucose-Capillary: 171 mg/dL — ABNORMAL HIGH (ref 70–99)
Glucose-Capillary: 105 mg/dL — ABNORMAL HIGH (ref 70–99)

## 2014-05-23 LAB — BASIC METABOLIC PANEL
ANION GAP: 20 — AB (ref 5–15)
BUN: 32 mg/dL — ABNORMAL HIGH (ref 6–23)
CALCIUM: 10.3 mg/dL (ref 8.4–10.5)
CO2: 25 mEq/L (ref 19–32)
CREATININE: 2.1 mg/dL — AB (ref 0.50–1.35)
Chloride: 96 mEq/L (ref 96–112)
GFR calc Af Amer: 40 mL/min — ABNORMAL LOW (ref >90)
GFR, EST NON AFRICAN AMERICAN: 34 mL/min — AB (ref >90)
Glucose, Bld: 146 mg/dL — ABNORMAL HIGH (ref 70–99)
Potassium: 3.5 mEq/L — ABNORMAL LOW (ref 3.7–5.3)
Sodium: 141 mEq/L (ref 137–147)

## 2014-05-23 LAB — CBC
HEMATOCRIT: 44.3 % (ref 39.0–52.0)
Hemoglobin: 14.6 g/dL (ref 13.0–17.0)
MCH: 28.2 pg (ref 26.0–34.0)
MCHC: 33 g/dL (ref 30.0–36.0)
MCV: 85.7 fL (ref 78.0–100.0)
Platelets: 188 10*3/uL (ref 150–400)
RBC: 5.17 MIL/uL (ref 4.22–5.81)
RDW: 16.8 % — AB (ref 11.5–15.5)
WBC: 6.6 10*3/uL (ref 4.0–10.5)

## 2014-05-23 LAB — POCT I-STAT 3, VENOUS BLOOD GAS (G3P V)
ACID-BASE EXCESS: 4 mmol/L — AB (ref 0.0–2.0)
Acid-Base Excess: 4 mmol/L — ABNORMAL HIGH (ref 0.0–2.0)
Bicarbonate: 29.8 mEq/L — ABNORMAL HIGH (ref 20.0–24.0)
Bicarbonate: 29.9 mEq/L — ABNORMAL HIGH (ref 20.0–24.0)
O2 Saturation: 57 %
O2 Saturation: 59 %
PCO2 VEN: 47.6 mmHg (ref 45.0–50.0)
PH VEN: 7.404 — AB (ref 7.250–7.300)
PO2 VEN: 31 mmHg (ref 30.0–45.0)
TCO2: 31 mmol/L (ref 0–100)
TCO2: 31 mmol/L (ref 0–100)
pCO2, Ven: 47.8 mmHg (ref 45.0–50.0)
pH, Ven: 7.405 — ABNORMAL HIGH (ref 7.250–7.300)
pO2, Ven: 30 mmHg (ref 30.0–45.0)

## 2014-05-23 LAB — PROTIME-INR
INR: 2.29 — ABNORMAL HIGH (ref 0.00–1.49)
Prothrombin Time: 25.2 seconds — ABNORMAL HIGH (ref 11.6–15.2)

## 2014-05-23 SURGERY — RIGHT HEART CATH
Anesthesia: LOCAL

## 2014-05-23 MED ORDER — NITROGLYCERIN 1 MG/10 ML FOR IR/CATH LAB
INTRA_ARTERIAL | Status: AC
  Filled 2014-05-23: qty 10

## 2014-05-23 MED ORDER — SODIUM CHLORIDE 0.9 % IV SOLN: INTRAVENOUS | Status: DC

## 2014-05-23 MED ORDER — FENTANYL CITRATE 0.05 MG/ML IJ SOLN
INTRAMUSCULAR | Status: AC
  Filled 2014-05-23: qty 2

## 2014-05-23 MED ORDER — HEPARIN (PORCINE) IN NACL 2-0.9 UNIT/ML-% IJ SOLN
INTRAMUSCULAR | Status: AC
  Filled 2014-05-23: qty 1000

## 2014-05-23 MED ORDER — SODIUM CHLORIDE 0.9 % IV SOLN: 250.0000 mL | INTRAVENOUS | Status: DC | PRN

## 2014-05-23 MED ORDER — SODIUM CHLORIDE 0.9 % IJ SOLN: 3.0000 mL | INTRAMUSCULAR | Status: DC | PRN

## 2014-05-23 MED ORDER — HEPARIN SODIUM (PORCINE) 1000 UNIT/ML IJ SOLN
INTRAMUSCULAR | Status: AC
  Filled 2014-05-23: qty 1

## 2014-05-23 MED ORDER — SODIUM CHLORIDE 0.9 % IJ SOLN: 3.0000 mL | Freq: Two times a day (BID) | INTRAMUSCULAR | Status: DC

## 2014-05-23 MED ORDER — MIDAZOLAM HCL 2 MG/2ML IJ SOLN
INTRAMUSCULAR | Status: AC
  Filled 2014-05-23: qty 2

## 2014-05-23 MED ORDER — ASPIRIN 81 MG PO CHEW
81.0000 mg | CHEWABLE_TABLET | ORAL | Status: AC
  Administered 2014-05-23: 81 mg via ORAL

## 2014-05-23 MED ORDER — LIDOCAINE HCL (PF) 1 % IJ SOLN
INTRAMUSCULAR | Status: AC
  Filled 2014-05-23: qty 30

## 2014-05-23 MED ORDER — DOPAMINE-DEXTROSE 3.2-5 MG/ML-% IV SOLN
INTRAVENOUS | Status: AC
  Filled 2014-05-23: qty 250

## 2014-05-23 MED ORDER — ASPIRIN 81 MG PO CHEW
CHEWABLE_TABLET | ORAL | Status: AC
  Filled 2014-05-23: qty 1

## 2014-05-23 NOTE — Discharge Instructions (Signed)
CALL DR [name]'S OFFICE IF ANY PROBLEMS,QUESTIONS, OR CONCERNS; CALL IF ANY BLEEDING,DRAINAGE,FEVER,PAIN,SWELLING, OR REDNESS AT RIGHT NECK SITE °

## 2014-05-23 NOTE — Progress Notes (Signed)
Right neck dressing clean dry and intact

## 2014-05-23 NOTE — CV Procedure (Signed)
Cardiac Cath Procedure Note:  Indication:  Heart failure/ cardiorenal syndrome  Procedures performed:  1) Right heart catheterization  Description of procedure:   The risks and indication of the procedure were explained. Consent was signed and placed on the chart. An appropriate timeout was taken prior to the procedure. The right neck was prepped and draped in the routine sterile fashion and anesthetized with 1% local lidocaine.   Using u/s guidance a 7 FR venous sheath was placed in the right internal jugular vein using a modified Seldinger technique. A standard Swan-Ganz catheter was used for the procedure.   Complications: None apparent.  Findings:  RA = 13 RV = 54/4/11 PA = 60/22 (37) PCW = 17 Fick cardiac output/index = 4.4/1.9 Thermo cardiac output/index = 3.8/1.6 PVR = 5.2 FA sat = 94% PA sat = 57%, 59%  Assessment: 1. Left sided filling pressures well compensated 2. Mild pulmonary artery HTN with elevated right-sided pressures 3. Moderately to severely depressed cardiac output  Plan/Discussion:  His left-sided pressures are well compensated. Unfortunately his cardiac output is moderately to severely depressed and I think is causing a cardiorenal syndrome. We discussed starting home milrinone but he says he is feeling better currently and would like to defer for now. We will need to follow him closely.   Daniel [name] 10:12 AM

## 2014-05-23 NOTE — Interval H&P Note (Signed)
History and Physical Interval Note:  05/23/2014 10:11 AM  Gary Hutchinson  has presented today for surgery, with the diagnosis of heart filure  The various methods of treatment have been discussed with the patient and family. After consideration of risks, benefits and other options for treatment, the patient has consented to  Procedure(s): RIGHT HEART CATH (N/A) as a surgical intervention .  The patient's history has been reviewed, patient examined, no change in status, stable for surgery.  I have reviewed the patient's chart and labs.  Questions were answered to the patient's satisfaction.     Kimba Lottes

## 2014-05-23 NOTE — Progress Notes (Signed)
Right neck dressing clean dry and intact 

## 2014-05-23 NOTE — H&P (View-Only) (Signed)
Patient ID: Gary Hutchinson, male   DOB: October 29, 1960, 53 y.o.   MRN: [id number]  PCP: Creola Corn EP: Hillis Range  HPI: Gary Hutchinson is a 53 year old male with a history of chronic systolic heart failure, ICM s/p ICD, HTN, DM II, OSA on CPAP, history of ventricular tachycardia status post AICD placement in 2009, atrial fibrillation and morbid obesity.    Admitted 6/1-03/13/14 for A/C HF and cardiogenic shock (co-ox 42%). Diuresed with IV lasix and milrinone which were weaned off.  He went into Afib with RVR and chemically cardioverted back to NSR with IV amiodarone. Placed on Xarelto. BB restarted 1/2 dose and held ACE-I. His blood sugars were elevated along with Hgb A1C and started on insulin. Discharge weight 344 lbs.   Follow up for Heart Failure: He returns for one month f/u/ At last visit dry weight increased to 330. BMET checked at that time and Cr up to 1.95. Demdex held for 2 days then cut back to 40/20. Renal function continued to get worse and Dr. [name] cut back diuretics again and planned referred to Renal. Now taking torsemide 30 (1.5 tabs) in am and 20 pm. Weight at home up about 3 pounds. Says breathing OK. Feels bloated.  Denies orthopnea, PND, CP or palpitations. Trying to follow a low salt diet and drink less than 2L a day.   Optivol: Fluid far above threshold since July 10.  Thoracic impedence down. Patient activity 0-1 hrs a day. No AF or VT.   Echo 05/03/12: EF 25-30%.  Grade 1 diastolic dysfunction.  Mild MR.  Mod dilated LA.          07/27/13: EF 40%, diff HK, MR mild, LA mild/mod dilated        03/2014: EF30-35%, grade II DD, mild MR, RV systolic fx mildly decreased   Labs  03/08/13 K 3.8 Creatinine 1.02  07/27/13 K 4.7 Creatinine 1.61 Dig level 0.9 ---> dig cut back to 0.125 mg every other day 10/10/13 K 3.7 Creatinine 1.4  03/17/14: K 3.8, Creatinine 1.8, BUN 29 (PCP office) 05/10/14: K 4.0 Cr 2.4  SH: Lives with son in [place]. Disabled. No ETOH or tobacco abuse  FH; Mother  living: HTN        Father deceased: was not part of his life so not sure health issues  ROS: All systems negative except as listed in HPI, PMH and Problem List.  Past Medical History  Diagnosis Date  . Nonischemic cardiomyopathy     a. LHC (02/2011) normal coronaries  . Ventricular tachycardia     s/p MDT ICD implant  . Alcohol abuse     now quit  . Pulmonary hypertension   . Diverticulosis of colon   . Hemorrhoids, internal   . Cholecystitis   . Morbid obesity   . Obstructive sleep apnea   . Anemia     iron defi  . Hypertension   . Gout   . Diabetes mellitus   . Chronic systolic congestive heart failure     a. RHC (02/2011) RA 31/27, RV 71/27, PA67/45, PCWP 46, PA 49%, Fick CO: 3.4 b. ECHO (03/2014) EF 30-35%, grade II DD, mild MR, RV poorly visualized appears mildly decreased   . Asthma     Current Outpatient Prescriptions  Medication Sig Dispense Refill  . acetaminophen (TYLENOL) 325 MG tablet Take 2 tablets (650 mg total) by mouth every 6 (six) hours as needed.      Marland Kitchen albuterol (PROAIR HFA) 108 (90 BASE) MCG/ACT  inhaler Inhale 2 puffs into the lungs every 6 (six) hours as needed. For wheezing or shortness of breath      . allopurinol (ZYLOPRIM) 100 MG tablet Take 2 tablets by mouth daily.       Marland Kitchen amiodarone (PACERONE) 200 MG tablet Take 1 tablet (200 mg total) by mouth 2 (two) times daily.  60 tablet  3  . aspirin 81 MG tablet Take 81 mg by mouth daily.      Marland Kitchen atorvastatin (LIPITOR) 40 MG tablet Take 1 tablet (40 mg total) by mouth daily.  30 tablet  6  . bisoprolol (ZEBETA) 10 MG tablet Take 0.5 tablets (5 mg total) by mouth daily.  15 tablet  3  . colchicine 0.6 MG tablet Take 0.6 mg by mouth daily as needed. Gout flare ups      . digoxin (LANOXIN) 0.25 MG tablet Take 0.5 tablets (125 mcg total) by mouth every other day.  15 tablet  6  . fluticasone (FLONASE) 50 MCG/ACT nasal spray Place 2 sprays into the nose as needed.       . Fluticasone-Salmeterol (ADVAIR DISKUS)  250-50 MCG/DOSE AEPB Inhale 1 puff into the lungs every 12 (twelve) hours as needed. For wheezing or shortness of breath      . hydrALAZINE (APRESOLINE) 25 MG tablet Take 1.5 tablets (37.5 mg total) by mouth every 8 (eight) hours.  135 tablet  3  . insulin detemir (LEVEMIR) 100 UNIT/ML injection Inject 28 Units into the skin at bedtime.      . isosorbide mononitrate (IMDUR) 30 MG 24 hr tablet TAKE 1 TABLET BY MOUTH TWICE A DAY  30 tablet  3  . linagliptin (TRADJENTA) 5 MG TABS tablet Take 1 tablet (5 mg total) by mouth daily.  30 tablet  3  . metolazone (ZAROXOLYN) 2.5 MG tablet Take 2.5 mg every Tue and Sat only if weight is greater than 340 lbs  12 tablet  3  . montelukast (SINGULAIR) 10 MG tablet Take 10 mg by mouth daily as needed. For shortness of breath or wheezing      . potassium chloride SA (K-DUR,KLOR-CON) 20 MEQ tablet Take 1 tablet (20 mEq total) by mouth daily.  30 tablet  3  . rivaroxaban (XARELTO) 20 MG TABS tablet Take 1 tablet (20 mg total) by mouth daily.  30 tablet  6  . spironolactone (ALDACTONE) 25 MG tablet Take 0.5 tablets (12.5 mg total) by mouth daily.  15 tablet  3  . torsemide (DEMADEX) 20 MG tablet Take 40 mg (2 tablets) every morning and 20 mg (1 tablet) every evening  120 tablet  3  . traMADol (ULTRAM) 50 MG tablet Take 1 tablet (50 mg total) by mouth every 8 (eight) hours as needed.  20 tablet  0   No current facility-administered medications for this encounter.    Filed Vitals:   05/18/14 1041  BP: 96/58  Pulse: 71  Weight: 338 lb (153.316 kg)  SpO2: 97%    PHYSICAL EXAM: General:  Fatigued appearing. No resp difficulty HEENT: normal Neck: Thick. JVP difficult to assess d/t body habitus but appears to jaw Carotids 2+ bilaterally; no bruits. No lymphadenopathy or thryomegaly appreciated. Cor: PMI nonpalpable. Regular rate & rhythm. No rubs or gallops. 2/6 TR murmur. 2/6 MR Lungs: clear Abdomen: obese, soft, nontender, + distended.  Good bowel  sounds. Extremities: no cyanosis, clubbing, rash, no edema. Neuro: alert & orientedx3, cranial nerves grossly intact. Moves all 4 extremities w/o difficulty. Affect pleasant.  ASSESSMENT & PLAN:   1) Chronic systolic HF: NICM s/p ICD Medtronic, EF 30-35% (03/2014).   - Symptomatically stable NYHA II-III but has progressive volume overload and renal failure with poor functional capacity on ICD accelerometer. Overall I suspect he has low output HF with cardio renal syndrome (versus intrinsic progressive renal failure) - We had a long talk about the options and I have recommended RHC to help evaluate cardiac output and filling pressures and need for inotropes. I do not think he would be candidate for VAD.  - Will increase torsemide back to 40 bid - Continue bisoprolol, digoxin and spiro at current doses. - Continue to hold ramipril  - Continue hydralazine 37.5 mg TID and continue Imdur 30 mg BID. - Check labs today including digoxin level - Optivol reviewed and below threshold but trending up and thoracic impedence down. No Afib and patient activity 0-1 hr a day. - Reinforced the need and importance of daily weights, a low sodium diet, and fluid restriction (less than 2 L a day). Instructed to call the HF clinic if weight increases more than 3 lbs overnight or 5 lbs in a week.  2) Acute on chronic renal failure - suspect cardiorenal syndrome. Will need RHC. Agree with referral to Nephrology. 3) HTN: controlled. Continue current medications 4) OSA  - Continue to wear CPAP nightly 5) PAF - New to patient 03/2014. Appears to be in NSR and no Afib on optivol. Continue amio to 200 bid. Decrease Xarelto to 15mg  daily given renal failure. 7) DM2 - Uncontrolled. Hgb A1C 9.6 (03/2014). Following with PCP and reports sugars better controlled which I praised patient for.    Total time spent 45 minutes. Over half that time spent discussing above.   Arvilla Meresaniel [name] MD 05/18/2014 11:29 AM

## 2014-05-23 NOTE — Progress Notes (Signed)
Site area: right I J 7 fr sheath  Site Prior to Removal:  Level 0 Pressure Applied For: 10 minutes Manual:   Yes pulled by Jannet Mantis RN Patient Status During Pull:  No complications  Post Pull Site:  Level 0 Post Pull Instructions Given:  yes Post Pull Pulses Present:  Dressing Applied:  tegaderm Bedrest begins @ 1040 Comments:

## 2014-05-26 ENCOUNTER — Telehealth (HOSPITAL_COMMUNITY): Payer: Self-pay | Admitting: Cardiology

## 2014-05-26 NOTE — Telephone Encounter (Signed)
Pt scheduled for RHC on 05/23/14 Cpt code 62952 icd 9 428.0 [id number] ex 07/09/14

## 2014-05-31 ENCOUNTER — Encounter: Payer: Self-pay | Admitting: Internal Medicine

## 2014-05-31 ENCOUNTER — Encounter (HOSPITAL_COMMUNITY): Payer: Self-pay

## 2014-05-31 NOTE — Progress Notes (Signed)
Patient ID: Gary Hutchinson, male   DOB: 10-Apr-1961, 53 y.o.   MRN: [id number] PCP: Creola Corn EP: Hillis Range  HPI: [name] is a 53 year old male with a history of chronic systolic heart failure, ICM s/p ICD, HTN, DM II, OSA on CPAP, history of ventricular tachycardia status post AICD placement in 2009, atrial fibrillation and morbid obesity.    Admitted 6/1-03/13/14 for A/C HF and cardiogenic shock (co-ox 42%). Diuresed with IV lasix and milrinone which were weaned off.  He went into Afib with RVR and chemically cardioverted back to NSR with IV amiodarone. Placed on Xarelto. BB restarted 1/2 dose and held ACE-I. His blood sugars were elevated along with Hgb A1C and started on insulin. Discharge weight 344 lbs.   RHC (05/2014): Left sided filling pressures well compensated, mild pulm HTN with elevated right-sided pressures and mod/severely depressed CO. Discussion about trial of milrinone but patient wanted to hold off.  Follow up for Heart Failure: Last visit torsemide increased to 40 mg BID, cut Xarelto back to 15 mg daily and patient underwent RHC. Cath showed mod/severely depressed CO and [name] recommended milrinone but patient refused. Doing well. Denies SOB, orthopnea, PND or CP. Weight at home 330 lbs. Following a low salt diet and drinking less than 2L a day. Does not feel bloated. Able to walk from parking garage to check in with no issues. Able to walk around the whole grocery store with no issues.   Optivol: Fluid approaching threshold and thoracic impedence down. Patient activity 1-2 hrs a day. No AF or VT.   Echo 05/03/12: EF 25-30%.  Grade 1 diastolic dysfunction.  Mild MR.  Mod dilated LA.          07/27/13: EF 40%, diff HK, MR mild, LA mild/mod dilated        03/2014: EF30-35%, grade II DD, mild MR, RV systolic fx mildly decreased   Labs  03/08/13 K 3.8 Creatinine 1.02  07/27/13 K 4.7 Creatinine 1.61 Dig level 0.9 ---> dig cut back to 0.125 mg every other day 10/10/13 K 3.7  Creatinine 1.4  03/17/14: K 3.8, Creatinine 1.8, BUN 29 (PCP office) 05/10/14: K 4.0 Cr 2.4 05/18/14: K 4.1, Cr 1.8, BUN 34, dig level 0.7,   SH: Lives with son in [place]. Disabled. No ETOH or tobacco abuse  FH; Mother living: HT        Father deceased: was not part of his life so not sure health issues  ROS: All systems negative except as listed in HPI, PMH and Problem List.  Past Medical History  Diagnosis Date  . Nonischemic cardiomyopathy     a. LHC (02/2011) normal coronaries  . Ventricular tachycardia     s/p MDT ICD implant  . Alcohol abuse     now quit  . Pulmonary hypertension   . Diverticulosis of colon   . Hemorrhoids, internal   . Cholecystitis   . Morbid obesity   . Obstructive sleep apnea   . Anemia     iron defi  . Hypertension   . Gout   . Diabetes mellitus   . Chronic systolic congestive heart failure     a. RHC (02/2011) RA 31/27, RV 71/27, PA67/45, PCWP 46, PA 49%, Fick CO: 3.4 b. ECHO (03/2014) EF 30-35%, grade II DD, mild MR, RV poorly visualized appears mildly decreased c. RHC (05/2014) RA 13, RV 54/4/11, PA 60/22 (37), PCWP 17, Fick CO/CI: 4.4 / 1.9, Thermo CO/CI: 3.8 /1.6, PVR 5.2 WU, PA 57%  and 59%  . Asthma     Current Outpatient Prescriptions  Medication Sig Dispense Refill  . acetaminophen (TYLENOL) 325 MG tablet Take 650 mg by mouth every 6 (six) hours as needed (pain).      Marland Kitchen albuterol (PROAIR HFA) 108 (90 BASE) MCG/ACT inhaler Inhale 2 puffs into the lungs every 6 (six) hours as needed. For wheezing or shortness of breath      . allopurinol (ZYLOPRIM) 100 MG tablet Take 2 tablets by mouth daily.       Marland Kitchen amiodarone (PACERONE) 200 MG tablet Take 1 tablet (200 mg total) by mouth daily.  60 tablet  3  . atorvastatin (LIPITOR) 40 MG tablet Take 1 tablet (40 mg total) by mouth daily.  30 tablet  6  . bisoprolol (ZEBETA) 10 MG tablet Take 0.5 tablets (5 mg total) by mouth daily.  15 tablet  3  . colchicine 0.6 MG tablet Take 0.6 mg by mouth daily. Gout  flare ups      . digoxin (LANOXIN) 0.25 MG tablet Take 0.125 mcg by mouth every other day.      . fluticasone (FLONASE) 50 MCG/ACT nasal spray Place 2 sprays into the nose as needed for allergies.       . Fluticasone-Salmeterol (ADVAIR DISKUS) 250-50 MCG/DOSE AEPB Inhale 1 puff into the lungs every 12 (twelve) hours as needed. For wheezing or shortness of breath      . hydrALAZINE (APRESOLINE) 25 MG tablet Take 1.5 tablets (37.5 mg total) by mouth every 8 (eight) hours.  135 tablet  3  . insulin detemir (LEVEMIR) 100 UNIT/ML injection Inject 28 Units into the skin at bedtime.      Marland Kitchen linagliptin (TRADJENTA) 5 MG TABS tablet Take 1 tablet (5 mg total) by mouth daily.  30 tablet  3  . metolazone (ZAROXOLYN) 2.5 MG tablet Take 2.5 mg by mouth daily as needed (edema).      . montelukast (SINGULAIR) 10 MG tablet Take 10 mg by mouth daily as needed. For shortness of breath or wheezing      . potassium chloride SA (K-DUR,KLOR-CON) 20 MEQ tablet Take 1 tablet (20 mEq total) by mouth daily.  30 tablet  3  . rivaroxaban (XARELTO) 15 MG TABS tablet Take 1 tablet (15 mg total) by mouth daily.  30 tablet  6  . spironolactone (ALDACTONE) 25 MG tablet Take 0.5 tablets (12.5 mg total) by mouth daily.  15 tablet  3  . torsemide (DEMADEX) 20 MG tablet Take 2 tablets (40 mg total) by mouth 2 (two) times daily.  120 tablet  3  . traMADol (ULTRAM) 50 MG tablet Take 50 mg by mouth every 8 (eight) hours as needed (pain).       No current facility-administered medications for this encounter.    Filed Vitals:   06/01/14 0929  BP: 117/73  Pulse: 77  Resp: 18  Weight: 324 lb (146.965 kg)  SpO2: 95%    PHYSICAL EXAM: General:  Well appearing. No resp difficulty, NAD HEENT: normal Neck: Thick. JVP difficult to assess d/t body but appears mildly elevated; Carotids 2+ bilaterally; no bruits. No lymphadenopathy or thryomegaly appreciated. Cor: PMI nonpalpable. Regular rate & rhythm. No rubs or gallops. 2/6 TR murmur.  2/6 MR Lungs: clear Abdomen: obese, soft, nontender, non-distended.  Good bowel sounds. Extremities: no cyanosis, clubbing, rash, no edema. Neuro: alert & orientedx3, cranial nerves grossly intact. Moves all 4 extremities w/o difficulty. Affect pleasant.  ASSESSMENT & PLAN:   1)  Chronic systolic HF: NICM s/p ICD Medtronic, EF 30-35% (03/2014).   - NYHA II-III symptoms and volume status appears stable. On Optivol it suggests volume is up and thoracic impedence down, however weight is less than when he had cath. Will not increase diuretics at this time and will continue torsemide 40 mg BID. Check BMET and pro-BNP. - Continue bisoprolol, digoxin and spiro at current doses. - Continue to hold ramipril with CKD. - Continue hydralazine 37.5 mg TID and continue Imdur 30 mg BID. - Reinforced the need and importance of daily weights, a low sodium diet, and fluid restriction (less than 2 L a day). Instructed to call the HF clinic if weight increases more than 3 lbs overnight or 5 lbs in a week.  2) CKD stage III - suspect cardiorenal syndrome. On cath CO was on marginal side. Will need to keep a close eye on renal function and if it continues to trend up may need to reconsider milrinone. 3) HTN: controlled. Continue current medications 4) OSA  - Continue to wear CPAP nightly 5) PAF - New to patient 03/2014. Appears to be in NSR and no Afib on optivol. Continue amio 200 bid, consider decreasing next visit. Continue Xarelto 15 mg daily d/t renal function.   F/U 1 month Aundria Rud NP-C 06/01/2014 9:36 AM

## 2014-06-01 ENCOUNTER — Other Ambulatory Visit (HOSPITAL_COMMUNITY): Payer: Self-pay

## 2014-06-01 ENCOUNTER — Encounter (HOSPITAL_COMMUNITY): Payer: Self-pay

## 2014-06-01 ENCOUNTER — Ambulatory Visit (HOSPITAL_COMMUNITY)
Admission: RE | Admit: 2014-06-01 | Discharge: 2014-06-01 | Disposition: A | Discharge status: Home / Self Care | Payer: Medicare Other | Source: Ambulatory Visit | Attending: Cardiology | Admitting: Cardiology

## 2014-06-01 ENCOUNTER — Telehealth (HOSPITAL_COMMUNITY): Payer: Self-pay

## 2014-06-01 VITALS — BP 117/73 | HR 77 | Resp 18 | Wt 324.0 lb

## 2014-06-01 DIAGNOSIS — I480 Paroxysmal atrial fibrillation: Secondary | ICD-10-CM

## 2014-06-01 DIAGNOSIS — I509 Heart failure, unspecified: Secondary | ICD-10-CM

## 2014-06-01 DIAGNOSIS — I4891 Unspecified atrial fibrillation: Secondary | ICD-10-CM | POA: Diagnosis not present

## 2014-06-01 DIAGNOSIS — E119 Type 2 diabetes mellitus without complications: Secondary | ICD-10-CM | POA: Diagnosis not present

## 2014-06-01 DIAGNOSIS — N183 Chronic kidney disease, stage 3 unspecified: Secondary | ICD-10-CM | POA: Diagnosis not present

## 2014-06-01 DIAGNOSIS — E669 Obesity, unspecified: Secondary | ICD-10-CM | POA: Diagnosis not present

## 2014-06-01 DIAGNOSIS — I5022 Chronic systolic (congestive) heart failure: Secondary | ICD-10-CM | POA: Diagnosis present

## 2014-06-01 DIAGNOSIS — G4733 Obstructive sleep apnea (adult) (pediatric): Secondary | ICD-10-CM

## 2014-06-01 DIAGNOSIS — I129 Hypertensive chronic kidney disease with stage 1 through stage 4 chronic kidney disease, or unspecified chronic kidney disease: Secondary | ICD-10-CM | POA: Diagnosis not present

## 2014-06-01 DIAGNOSIS — I10 Essential (primary) hypertension: Secondary | ICD-10-CM

## 2014-06-01 LAB — BASIC METABOLIC PANEL
ANION GAP: 15 (ref 5–15)
BUN: 17 mg/dL (ref 6–23)
CHLORIDE: 98 meq/L (ref 96–112)
CO2: 24 mEq/L (ref 19–32)
Calcium: 8.8 mg/dL (ref 8.4–10.5)
Creatinine, Ser: 1.4 mg/dL — ABNORMAL HIGH (ref 0.50–1.35)
GFR calc non Af Amer: 56 mL/min — ABNORMAL LOW (ref >90)
GFR, EST AFRICAN AMERICAN: 65 mL/min — AB (ref >90)
Glucose, Bld: 159 mg/dL — ABNORMAL HIGH (ref 70–99)
POTASSIUM: 5.6 meq/L — AB (ref 3.7–5.3)
SODIUM: 137 meq/L (ref 137–147)

## 2014-06-01 LAB — PRO B NATRIURETIC PEPTIDE: PRO B NATRI PEPTIDE: 2453 pg/mL — AB (ref 0–125)

## 2014-06-01 NOTE — Patient Instructions (Signed)
Doing well.  Continue current medications.  Will call about blood work.  Follow up in 1 month  Do the following things EVERYDAY: 1) Weigh yourself in the morning before breakfast. Write it down and keep it in a log. 2) Take your medicines as prescribed 3) Eat low salt foods-Limit salt (sodium) to 2000 mg per day.  4) Stay as active as you can everyday 5) Limit all fluids for the day to less than 2 liters 6)

## 2014-06-01 NOTE — Telephone Encounter (Signed)
Lab results reviewed with patient.  Intrcuted to stop taking potassium.  Will repeat BMET next Tuesday at our clinic.  Aware and agreeable. Ave Filter

## 2014-06-06 ENCOUNTER — Ambulatory Visit (HOSPITAL_COMMUNITY)
Admission: RE | Admit: 2014-06-06 | Discharge: 2014-06-06 | Disposition: A | Discharge status: Home / Self Care | Payer: Medicare Other | Source: Ambulatory Visit | Attending: Cardiology | Admitting: Cardiology

## 2014-06-06 DIAGNOSIS — N189 Chronic kidney disease, unspecified: Secondary | ICD-10-CM

## 2014-06-06 DIAGNOSIS — N179 Acute kidney failure, unspecified: Secondary | ICD-10-CM | POA: Diagnosis not present

## 2014-06-06 DIAGNOSIS — I5022 Chronic systolic (congestive) heart failure: Secondary | ICD-10-CM

## 2014-06-06 LAB — BASIC METABOLIC PANEL
ANION GAP: 17 — AB (ref 5–15)
BUN: 26 mg/dL — ABNORMAL HIGH (ref 6–23)
CHLORIDE: 100 meq/L (ref 96–112)
CO2: 21 mEq/L (ref 19–32)
Calcium: 9.3 mg/dL (ref 8.4–10.5)
Creatinine, Ser: 1.34 mg/dL (ref 0.50–1.35)
GFR, EST AFRICAN AMERICAN: 68 mL/min — AB (ref >90)
GFR, EST NON AFRICAN AMERICAN: 59 mL/min — AB (ref >90)
Glucose, Bld: 139 mg/dL — ABNORMAL HIGH (ref 70–99)
POTASSIUM: 4.2 meq/L (ref 3.7–5.3)
SODIUM: 138 meq/L (ref 137–147)

## 2014-06-15 ENCOUNTER — Encounter: Payer: Self-pay | Admitting: Internal Medicine

## 2014-07-04 ENCOUNTER — Encounter (HOSPITAL_COMMUNITY): Payer: Medicare Other

## 2014-07-04 NOTE — Progress Notes (Signed)
Patient ID: Gary Hutchinson, male   DOB: May 12, 1961, 53 y.o.   MRN: [id number] PCP: Creola Corn EP: Hillis Range  HPI: [name] is a 53 year old male with a history of chronic systolic heart failure, ICM s/p ICD, HTN, DM II, OSA on CPAP, history of ventricular tachycardia status post AICD placement in 2009, atrial fibrillation and morbid obesity.    Admitted 6/1-03/13/14 for A/C HF and cardiogenic shock (co-ox 42%). Diuresed with IV lasix and milrinone which were weaned off.  He went into Afib with RVR and chemically cardioverted back to NSR with IV amiodarone. Placed on Xarelto. BB restarted 1/2 dose and held ACE-I. His blood sugars were elevated along with Hgb A1C and started on insulin. Discharge weight 344 lbs.   RHC (05/2014): Left sided filling pressures well compensated, mild pulm HTN with elevated right-sided pressures and mod/severely depressed CO. Discussion about trial of milrinone but patient wanted to hold off.  Follow up for Heart Failure: Overall he is feeling great. Denies SOB/PND/Orthopnea. Weight at home 309-310 pounds. Walking daily. Taking all medications. Following low salt diet and limiting fluid intake to< 2 liters per day.    Optivol: Fluid well above threshold. Activy about 2 hours. Patient activity 1-2 hrs a day. No AF or VT.   Echo 05/03/12: EF 25-30%.  Grade 1 diastolic dysfunction.  Mild MR.  Mod dilated LA.          07/27/13: EF 40%, diff HK, MR mild, LA mild/mod dilated        03/2014: EF30-35%, grade II DD, mild MR, RV systolic fx mildly decreased   Labs  03/08/13 K 3.8 Creatinine 1.02  07/27/13 K 4.7 Creatinine 1.61 Dig level 0.9 ---> dig cut back to 0.125 mg every other day 10/10/13 K 3.7 Creatinine 1.4  03/17/14: K 3.8, Creatinine 1.8, BUN 29 (PCP office) 05/10/14: K 4.0 Cr 2.4 05/18/14: K 4.1, Cr 1.8, BUN 34, dig level 0.7,  06/06/14: K 4.2 Creatinine 1.34   SH: Lives with son in [place]. Disabled. No ETOH or tobacco abuse  FH; Mother living: HT        Father  deceased: was not part of his life so not sure health issues  ROS: All systems negative except as listed in HPI, PMH and Problem List.  Past Medical History  Diagnosis Date  . Nonischemic cardiomyopathy     a. LHC (02/2011) normal coronaries  . Ventricular tachycardia     s/p MDT ICD implant  . Alcohol abuse     now quit  . Pulmonary hypertension   . Diverticulosis of colon   . Hemorrhoids, internal   . Cholecystitis   . Morbid obesity   . Obstructive sleep apnea   . Anemia     iron defi  . Hypertension   . Gout   . Diabetes mellitus   . Chronic systolic congestive heart failure     a. RHC (02/2011) RA 31/27, RV 71/27, PA67/45, PCWP 46, PA 49%, Fick CO: 3.4 b. ECHO (03/2014) EF 30-35%, grade II DD, mild MR, RV poorly visualized appears mildly decreased c. RHC (05/2014) RA 13, RV 54/4/11, PA 60/22 (37), PCWP 17, Fick CO/CI: 4.4 / 1.9, Thermo CO/CI: 3.8 /1.6, PVR 5.2 WU, PA 57% and 59%  . Asthma     Current Outpatient Prescriptions  Medication Sig Dispense Refill  . acetaminophen (TYLENOL) 325 MG tablet Take 650 mg by mouth every 6 (six) hours as needed (pain).      Marland Kitchen albuterol (PROAIR HFA)  108 (90 BASE) MCG/ACT inhaler Inhale 2 puffs into the lungs every 6 (six) hours as needed. For wheezing or shortness of breath      . allopurinol (ZYLOPRIM) 100 MG tablet Take 2 tablets by mouth daily.       Marland Kitchen amiodarone (PACERONE) 200 MG tablet Take 1 tablet (200 mg total) by mouth daily.  60 tablet  3  . atorvastatin (LIPITOR) 40 MG tablet Take 1 tablet (40 mg total) by mouth daily.  30 tablet  6  . bisoprolol (ZEBETA) 10 MG tablet Take 0.5 tablets (5 mg total) by mouth daily.  15 tablet  3  . colchicine 0.6 MG tablet Take 0.6 mg by mouth daily. Gout flare ups      . digoxin (LANOXIN) 0.25 MG tablet Take 0.125 mcg by mouth every other day.      . fluticasone (FLONASE) 50 MCG/ACT nasal spray Place 2 sprays into the nose as needed for allergies.       . Fluticasone-Salmeterol (ADVAIR DISKUS)  250-50 MCG/DOSE AEPB Inhale 1 puff into the lungs every 12 (twelve) hours as needed. For wheezing or shortness of breath      . hydrALAZINE (APRESOLINE) 25 MG tablet Take 1.5 tablets (37.5 mg total) by mouth every 8 (eight) hours.  135 tablet  3  . insulin detemir (LEVEMIR) 100 UNIT/ML injection Inject 28 Units into the skin at bedtime.      . isosorbide mononitrate (IMDUR) 30 MG 24 hr tablet Take 30 mg by mouth 2 (two) times daily.      Marland Kitchen linagliptin (TRADJENTA) 5 MG TABS tablet Take 1 tablet (5 mg total) by mouth daily.  30 tablet  3  . metolazone (ZAROXOLYN) 2.5 MG tablet Take 2.5 mg by mouth daily as needed (edema).      . montelukast (SINGULAIR) 10 MG tablet Take 10 mg by mouth daily as needed. For shortness of breath or wheezing      . rivaroxaban (XARELTO) 15 MG TABS tablet Take 1 tablet (15 mg total) by mouth daily.  30 tablet  6  . spironolactone (ALDACTONE) 25 MG tablet Take 0.5 tablets (12.5 mg total) by mouth daily.  15 tablet  3  . torsemide (DEMADEX) 20 MG tablet Take 2 tablets (40 mg total) by mouth 2 (two) times daily.  120 tablet  3  . traMADol (ULTRAM) 50 MG tablet Take 50 mg by mouth every 8 (eight) hours as needed (pain).       No current facility-administered medications for this encounter.    Filed Vitals:   07/06/14 1109  BP: 130/82  Pulse: 84  Resp: 18  Weight: 315 lb 4 oz (142.996 kg)  SpO2: 98%    PHYSICAL EXAM: General:  Well appearing. No resp difficulty, NAD HEENT: normal Neck: Thick. JVP difficult to assess d/t body but appears mildly elevated; Carotids 2+ bilaterally; no bruits. No lymphadenopathy or thryomegaly appreciated. Cor: PMI nonpalpable. Regular rate & rhythm. No rubs or gallops. 2/6 TR murmur. 2/6 MR Lungs: clear Abdomen: obese, soft, nontender, non-distended.  Good bowel sounds. Extremities: no cyanosis, clubbing, rash, no edema. Neuro: alert & orientedx3, cranial nerves grossly intact. Moves all 4 extremities w/o difficulty. Affect  pleasant.  ASSESSMENT & PLAN:   1) Chronic systolic HF: NICM s/p ICD Medtronic, EF 30-35% (03/2014).   - NYHA II-III symptoms and volume status appears stable. Will not increase diuretics at this time despite elevated optivol. On exam he appears stable.  Continue torsemide 40 mg BID.  -  Continue bisoprolol 5 mg daily. Continue digoxin every other day.  Continue  12.5 mg spiro daily. . Increase hydralazine 50  mg TID and continue Imdur 30 mg BID. Will not add ace due to CKD.  - Reinforced the need and importance of daily weights, a low sodium diet, and fluid restriction (less than 2 L a day). Instructed to call the HF clinic if weight increases more than 3 lbs overnight or 5 lbs in a week.  2) CKD stage III - Check BMETnext week 3) HTN: controlled. Continue current medications 4) OSA - Continue to wear CPAP nightly 5) PAF- New to patient 03/2014. Appears to be in NSR and no Afib on optivol. Continue amio 200 daily. Continue Xarelto 15 mg daily d/t renal function.  6) Obesity: Needs to lose weight. Provide information on   Follow up in 2 months  CLEGG,AMY NP-C 07/06/2014 11:22 AM  Patient seen and examined with Tonye BecketAmy Clegg, NP. We discussed all aspects of the encounter. I agree with the assessment and plan as stated above.   He continues to feel better. Weight is down. No edema on exam. ICD interrogated personally. No VT/AF. Optivol way up.   I don't feel Optivol is accurate. Will continue current diuretic regimen. Increase hydralazine.   Truman HaywardDaniel [name],MD 6:58 PM

## 2014-07-06 ENCOUNTER — Encounter (HOSPITAL_COMMUNITY): Payer: Self-pay

## 2014-07-06 ENCOUNTER — Ambulatory Visit (HOSPITAL_COMMUNITY)
Admission: RE | Admit: 2014-07-06 | Discharge: 2014-07-06 | Disposition: A | Discharge status: Home / Self Care | Payer: Medicare Other | Source: Ambulatory Visit | Attending: Internal Medicine | Admitting: Internal Medicine

## 2014-07-06 ENCOUNTER — Encounter: Payer: Self-pay | Admitting: Internal Medicine

## 2014-07-06 VITALS — BP 130/82 | HR 84 | Resp 18 | Wt 315.2 lb

## 2014-07-06 DIAGNOSIS — I429 Cardiomyopathy, unspecified: Secondary | ICD-10-CM | POA: Insufficient documentation

## 2014-07-06 DIAGNOSIS — Z794 Long term (current) use of insulin: Secondary | ICD-10-CM | POA: Insufficient documentation

## 2014-07-06 DIAGNOSIS — E119 Type 2 diabetes mellitus without complications: Secondary | ICD-10-CM | POA: Diagnosis not present

## 2014-07-06 DIAGNOSIS — N183 Chronic kidney disease, stage 3 (moderate): Secondary | ICD-10-CM | POA: Diagnosis not present

## 2014-07-06 DIAGNOSIS — I4891 Unspecified atrial fibrillation: Secondary | ICD-10-CM | POA: Insufficient documentation

## 2014-07-06 DIAGNOSIS — I5022 Chronic systolic (congestive) heart failure: Secondary | ICD-10-CM | POA: Diagnosis present

## 2014-07-06 DIAGNOSIS — J45909 Unspecified asthma, uncomplicated: Secondary | ICD-10-CM | POA: Insufficient documentation

## 2014-07-06 DIAGNOSIS — Z7901 Long term (current) use of anticoagulants: Secondary | ICD-10-CM | POA: Insufficient documentation

## 2014-07-06 DIAGNOSIS — E669 Obesity, unspecified: Secondary | ICD-10-CM | POA: Diagnosis not present

## 2014-07-06 DIAGNOSIS — I130 Hypertensive heart and chronic kidney disease with heart failure and stage 1 through stage 4 chronic kidney disease, or unspecified chronic kidney disease: Secondary | ICD-10-CM | POA: Insufficient documentation

## 2014-07-06 DIAGNOSIS — G4733 Obstructive sleep apnea (adult) (pediatric): Secondary | ICD-10-CM | POA: Diagnosis not present

## 2014-07-06 MED ORDER — HYDRALAZINE HCL 50 MG PO TABS: 50.0000 mg | ORAL_TABLET | Freq: Three times a day (TID) | ORAL | Status: DC

## 2014-07-06 MED ORDER — HYDRALAZINE HCL 25 MG PO TABS: 50.0000 mg | ORAL_TABLET | Freq: Three times a day (TID) | ORAL | Status: DC

## 2014-07-06 NOTE — Patient Instructions (Signed)
Follow up in 6 weeks   Take hydralalzine 50 mg three times a day  Do the following things EVERYDAY: 1) Weigh yourself in the morning before breakfast. Write it down and keep it in a log. 2) Take your medicines as prescribed 3) Eat low salt foods-Limit salt (sodium) to 2000 mg per day.  4) Stay as active as you can everyday 5) Limit all fluids for the day to less than 2 liters

## 2014-07-13 ENCOUNTER — Other Ambulatory Visit (HOSPITAL_COMMUNITY): Payer: Self-pay | Admitting: Cardiology

## 2014-07-13 MED ORDER — ATORVASTATIN CALCIUM 40 MG PO TABS
40.0000 mg | ORAL_TABLET | Freq: Every day | ORAL | Status: DC
Start: 2014-07-13 — End: 2015-08-10

## 2014-07-13 MED ORDER — AMIODARONE HCL 200 MG PO TABS: 200.0000 mg | ORAL_TABLET | Freq: Every day | ORAL | Status: DC

## 2014-07-13 MED ORDER — HYDRALAZINE HCL 50 MG PO TABS
50.0000 mg | ORAL_TABLET | Freq: Three times a day (TID) | ORAL | Status: DC
Start: 2014-07-13 — End: 2014-07-14

## 2014-07-13 MED ORDER — BISOPROLOL FUMARATE 10 MG PO TABS: 5.0000 mg | ORAL_TABLET | Freq: Every day | ORAL | Status: DC

## 2014-07-13 NOTE — Addendum Note (Signed)
Addended by: Theresia Bough on: 07/13/2014 02:50 PM   Modules accepted: Orders

## 2014-07-14 ENCOUNTER — Other Ambulatory Visit (HOSPITAL_COMMUNITY): Payer: Self-pay

## 2014-07-14 MED ORDER — HYDRALAZINE HCL 50 MG PO TABS: 50.0000 mg | ORAL_TABLET | Freq: Three times a day (TID) | ORAL | Status: DC

## 2014-07-14 MED ORDER — ATORVASTATIN CALCIUM 40 MG PO TABS: 40.0000 mg | ORAL_TABLET | Freq: Every day | ORAL | Status: DC

## 2014-07-14 MED ORDER — AMIODARONE HCL 200 MG PO TABS: 200.0000 mg | ORAL_TABLET | Freq: Every day | ORAL | Status: DC

## 2014-07-14 MED ORDER — ISOSORBIDE MONONITRATE ER 30 MG PO TB24: 30.0000 mg | ORAL_TABLET | Freq: Two times a day (BID) | ORAL | Status: DC

## 2014-07-24 ENCOUNTER — Ambulatory Visit (INDEPENDENT_AMBULATORY_CARE_PROVIDER_SITE_OTHER): Payer: Medicare Other | Admitting: *Deleted

## 2014-07-24 ENCOUNTER — Telehealth: Payer: Self-pay | Admitting: Cardiology

## 2014-07-24 DIAGNOSIS — I429 Cardiomyopathy, unspecified: Secondary | ICD-10-CM

## 2014-07-24 DIAGNOSIS — I5022 Chronic systolic (congestive) heart failure: Secondary | ICD-10-CM

## 2014-07-24 DIAGNOSIS — I428 Other cardiomyopathies: Secondary | ICD-10-CM

## 2014-07-24 LAB — MDC_IDC_ENUM_SESS_TYPE_REMOTE
Battery Voltage: 2.85 V
HIGH POWER IMPEDANCE MEASURED VALUE: 96 Ohm
Lead Channel Impedance Value: 344 Ohm
Lead Channel Setting Pacing Amplitude: 2.5 V
Lead Channel Setting Pacing Pulse Width: 0.4 ms
Lead Channel Setting Sensing Sensitivity: 0.3 mV
MDC IDC MSMT LEADCHNL RV SENSING INTR AMPL: 7.2 mV
MDC IDC SESS DTM: 20151019194142
MDC IDC SET ZONE DETECTION INTERVAL: 300 ms
MDC IDC SET ZONE DETECTION INTERVAL: 400 ms
MDC IDC STAT BRADY RV PERCENT PACED: 0.04 %
Zone Setting Detection Interval: 340 ms

## 2014-07-24 NOTE — Telephone Encounter (Signed)
LMOVM reminding pt to send remote transmission.   

## 2014-07-24 NOTE — Progress Notes (Signed)
Remote ICD transmission.   

## 2014-08-02 ENCOUNTER — Other Ambulatory Visit (HOSPITAL_COMMUNITY): Payer: Self-pay | Admitting: Anesthesiology

## 2014-08-03 ENCOUNTER — Encounter: Payer: Self-pay | Admitting: Cardiology

## 2014-08-10 ENCOUNTER — Encounter: Payer: Self-pay | Admitting: Internal Medicine

## 2014-08-21 ENCOUNTER — Ambulatory Visit (HOSPITAL_COMMUNITY)
Admission: RE | Admit: 2014-08-21 | Discharge: 2014-08-21 | Disposition: A | Discharge status: Home / Self Care | Payer: Medicare Other | Source: Ambulatory Visit | Attending: Cardiology | Admitting: Cardiology

## 2014-08-21 ENCOUNTER — Encounter (HOSPITAL_COMMUNITY): Payer: Self-pay

## 2014-08-21 DIAGNOSIS — I5022 Chronic systolic (congestive) heart failure: Secondary | ICD-10-CM | POA: Diagnosis present

## 2014-08-21 DIAGNOSIS — I10 Essential (primary) hypertension: Secondary | ICD-10-CM | POA: Insufficient documentation

## 2014-08-21 DIAGNOSIS — I480 Paroxysmal atrial fibrillation: Secondary | ICD-10-CM | POA: Insufficient documentation

## 2014-08-21 DIAGNOSIS — G4733 Obstructive sleep apnea (adult) (pediatric): Secondary | ICD-10-CM | POA: Insufficient documentation

## 2014-08-21 DIAGNOSIS — E669 Obesity, unspecified: Secondary | ICD-10-CM | POA: Insufficient documentation

## 2014-08-21 DIAGNOSIS — N183 Chronic kidney disease, stage 3 (moderate): Secondary | ICD-10-CM | POA: Diagnosis not present

## 2014-08-21 LAB — T3, FREE: T3 FREE: 2.6 pg/mL (ref 2.3–4.2)

## 2014-08-21 LAB — COMPREHENSIVE METABOLIC PANEL
ALBUMIN: 3.6 g/dL (ref 3.5–5.2)
ALK PHOS: 95 U/L (ref 39–117)
ALT: 34 U/L (ref 0–53)
AST: 36 U/L (ref 0–37)
Anion gap: 17 — ABNORMAL HIGH (ref 5–15)
BILIRUBIN TOTAL: 0.6 mg/dL (ref 0.3–1.2)
BUN: 33 mg/dL — AB (ref 6–23)
CHLORIDE: 102 meq/L (ref 96–112)
CO2: 21 mEq/L (ref 19–32)
Calcium: 9.2 mg/dL (ref 8.4–10.5)
Creatinine, Ser: 1.46 mg/dL — ABNORMAL HIGH (ref 0.50–1.35)
GFR calc Af Amer: 62 mL/min — ABNORMAL LOW (ref >90)
GFR calc non Af Amer: 53 mL/min — ABNORMAL LOW (ref >90)
Glucose, Bld: 150 mg/dL — ABNORMAL HIGH (ref 70–99)
POTASSIUM: 4.1 meq/L (ref 3.7–5.3)
SODIUM: 140 meq/L (ref 137–147)
Total Protein: 7.6 g/dL (ref 6.0–8.3)

## 2014-08-21 LAB — DIGOXIN LEVEL: Digoxin Level: 0.7 ng/mL — ABNORMAL LOW (ref 0.8–2.0)

## 2014-08-21 LAB — TSH: TSH: 3.07 u[IU]/mL (ref 0.350–4.500)

## 2014-08-21 LAB — T4, FREE: FREE T4: 1.16 ng/dL (ref 0.80–1.80)

## 2014-08-21 NOTE — Progress Notes (Signed)
Patient ID: Gary Hutchinson, male   DOB: 1961-01-29, 53 y.o.   MRN: [id number] PCP: Creola Corn EP: Hillis Range  HPI: Gary Hutchinson is a 53 year old male with a history of chronic systolic heart failure, ICM s/p ICD, HTN, DM II, OSA on CPAP, history of ventricular tachycardia status post AICD placement in 2009, atrial fibrillation and morbid obesity.    Admitted 6/1-03/13/14 for A/C HF and cardiogenic shock (co-ox 42%). Diuresed with IV lasix and milrinone which were weaned off.  He went into Afib with RVR and chemically cardioverted back to NSR with IV amiodarone. Placed on Xarelto. BB restarted 1/2 dose and held ACE-I. His blood sugars were elevated along with Hgb A1C and started on insulin. Discharge weight 344 lbs.   RHC (05/2014): Left sided filling pressures well compensated, mild pulm HTN with elevated right-sided pressures and mod/severely depressed CO. Discussion about trial of milrinone but patient wanted to hold off.  Follow up for Heart Failure: Overall he is feeling great. Denies SOB/PND/Orthopnea. Able to walk up steps. Weight at home 303 pounds. Trying to walk daily. Taking all medications. He has cut back on his portions.  Following low salt diet and limiting fluid intake to< 2 liters per day.   Echo 05/03/12: EF 25-30%.  Grade 1 diastolic dysfunction.  Mild MR.  Mod dilated LA.          07/27/13: EF 40%, diff HK, MR mild, LA mild/mod dilated        03/2014: EF30-35%, grade II DD, mild MR, RV systolic fx mildly decreased   Labs  03/08/13 K 3.8 Creatinine 1.02  07/27/13 K 4.7 Creatinine 1.61 Dig level 0.9 ---> dig cut back to 0.125 mg every other day 10/10/13 K 3.7 Creatinine 1.4  03/17/14: K 3.8, Creatinine 1.8, BUN 29 (PCP office) 05/10/14: K 4.0 Cr 2.4 05/18/14: K 4.1, Cr 1.8, BUN 34, dig level 0.7,  06/06/14: K 4.2 Creatinine 1.34   SH: Lives with son in [place]. Disabled. No ETOH or tobacco abuse  FH; Mother living: HT        Father deceased: was not part of his life so not sure health  issues  ROS: All systems negative except as listed in HPI, PMH and Problem List.  Past Medical History  Diagnosis Date  . Nonischemic cardiomyopathy     a. LHC (02/2011) normal coronaries  . Ventricular tachycardia     s/p MDT ICD implant  . Alcohol abuse     now quit  . Pulmonary hypertension   . Diverticulosis of colon   . Hemorrhoids, internal   . Cholecystitis   . Morbid obesity   . Obstructive sleep apnea   . Anemia     iron defi  . Hypertension   . Gout   . Diabetes mellitus   . Chronic systolic congestive heart failure     a. RHC (02/2011) RA 31/27, RV 71/27, PA67/45, PCWP 46, PA 49%, Fick CO: 3.4 b. ECHO (03/2014) EF 30-35%, grade II DD, mild MR, RV poorly visualized appears mildly decreased c. RHC (05/2014) RA 13, RV 54/4/11, PA 60/22 (37), PCWP 17, Fick CO/CI: 4.4 / 1.9, Thermo CO/CI: 3.8 /1.6, PVR 5.2 WU, PA 57% and 59%  . Asthma     Current Outpatient Prescriptions  Medication Sig Dispense Refill  . acetaminophen (TYLENOL) 325 MG tablet Take 650 mg by mouth every 6 (six) hours as needed (pain).    Marland Kitchen albuterol (PROAIR HFA) 108 (90 BASE) MCG/ACT inhaler Inhale 2 puffs into  the lungs every 6 (six) hours as needed. For wheezing or shortness of breath    . allopurinol (ZYLOPRIM) 100 MG tablet Take 2 tablets by mouth daily.     Marland Kitchen amiodarone (PACERONE) 200 MG tablet Take 1 tablet (200 mg total) by mouth daily. 90 tablet 3  . amiodarone (PACERONE) 200 MG tablet TAKE 1 TABLET (200 MG TOTAL) BY MOUTH 2 (TWO) TIMES DAILY. 60 tablet 3  . atorvastatin (LIPITOR) 40 MG tablet Take 1 tablet (40 mg total) by mouth daily. 90 tablet 6  . bisoprolol (ZEBETA) 10 MG tablet Take 0.5 tablets (5 mg total) by mouth daily. 45 tablet 3  . colchicine 0.6 MG tablet Take 0.6 mg by mouth daily. Gout flare ups    . digoxin (LANOXIN) 0.25 MG tablet Take 0.125 mcg by mouth every other day.    . fluticasone (FLONASE) 50 MCG/ACT nasal spray Place 2 sprays into the nose as needed for allergies.     .  Fluticasone-Salmeterol (ADVAIR DISKUS) 250-50 MCG/DOSE AEPB Inhale 1 puff into the lungs every 12 (twelve) hours as needed. For wheezing or shortness of breath    . hydrALAZINE (APRESOLINE) 50 MG tablet Take 1 tablet (50 mg total) by mouth 3 (three) times daily. 270 tablet 6  . insulin detemir (LEVEMIR) 100 UNIT/ML injection Inject 28 Units into the skin at bedtime.    . isosorbide mononitrate (IMDUR) 30 MG 24 hr tablet Take 1 tablet (30 mg total) by mouth 2 (two) times daily. 60 tablet 3  . linagliptin (TRADJENTA) 5 MG TABS tablet Take 1 tablet (5 mg total) by mouth daily. 30 tablet 3  . metolazone (ZAROXOLYN) 2.5 MG tablet Take 2.5 mg by mouth daily as needed (edema).    . montelukast (SINGULAIR) 10 MG tablet Take 10 mg by mouth daily as needed. For shortness of breath or wheezing    . potassium chloride SA (K-DUR,KLOR-CON) 20 MEQ tablet Take 20 mEq by mouth daily.    . rivaroxaban (XARELTO) 15 MG TABS tablet Take 1 tablet (15 mg total) by mouth daily. 30 tablet 6  . spironolactone (ALDACTONE) 25 MG tablet Take 0.5 tablets (12.5 mg total) by mouth daily. 15 tablet 3  . torsemide (DEMADEX) 20 MG tablet Take 2 tablets (40 mg total) by mouth 2 (two) times daily. 120 tablet 3  . traMADol (ULTRAM) 50 MG tablet Take 50 mg by mouth every 8 (eight) hours as needed (pain).     No current facility-administered medications for this encounter.    Filed Vitals:   08/21/14 1026  BP: 112/68  Pulse: 68  Weight: 308 lb 12.8 oz (140.071 kg)  SpO2: 98%    PHYSICAL EXAM: General:  Well appearing. No resp difficulty, NAD HEENT: normal Neck: Thick. JVP difficult to assess d/t body but appears mildly elevated; Carotids 2+ bilaterally; no bruits. No lymphadenopathy or thryomegaly appreciated. Cor: PMI nonpalpable. Regular rate & rhythm. No rubs or gallops. 2/6 TR murmur. 2/6 MR Lungs: clear Abdomen: obese, soft, nontender, non-distended.  Good bowel sounds. Extremities: no cyanosis, clubbing, rash, no  edema. Neuro: alert & orientedx3, cranial nerves grossly intact. Moves all 4 extremities w/o difficulty. Affect pleasant.  ASSESSMENT & PLAN:   1) Chronic systolic HF: NICM s/p ICD Medtronic, EF 30-35% (03/2014).   - Overall doing well. NYHA II symptoms and volume status appears stable.   Continue torsemide 40 mg BID.  - Continue bisoprolol 5 mg daily. Continue digoxin every other day.  Continue  12.5 mg spiro daily. Marland Kitchen  Continue hydralazine 50  mg TID and continue Imdur 30 mg BID. Will not add ace due to CKD.  Check BMET and dig level today  - Reinforced the need and importance of daily weights, a low sodium diet, and fluid restriction (less than 2 L a day). Instructed to call the HF clinic if weight increases more than 3 lbs overnight or 5 lbs in a week.  2) CKD stage III - Followed by renal.  3) HTN: controlled. Continue current medications 4) OSA - Continue to wear CPAP nightly 5) PAF- New to patient 03/2014. Appears to be in NSR and no Afib on optivol. Continue amio 200 daily. Continue Xarelto 15 mg daily d/t renal function. Check TSH and liver function today. Needs yearly eye exam and he is aware.  6) Obesity:  Has lost about 50 pounds.   Follow up in 3 months with NP  CLEGG,AMY NP-C 08/21/2014 10:46 AM

## 2014-08-21 NOTE — Patient Instructions (Signed)
Labs today  We will contact you in 3 months to schedule your next appointment.  

## 2014-08-26 ENCOUNTER — Other Ambulatory Visit (HOSPITAL_COMMUNITY): Payer: Self-pay | Admitting: Anesthesiology

## 2014-09-14 ENCOUNTER — Encounter (HOSPITAL_COMMUNITY): Payer: Self-pay | Admitting: Internal Medicine

## 2014-10-05 ENCOUNTER — Other Ambulatory Visit (HOSPITAL_COMMUNITY): Payer: Self-pay | Admitting: Internal Medicine

## 2014-10-05 DIAGNOSIS — I5022 Chronic systolic (congestive) heart failure: Secondary | ICD-10-CM

## 2014-10-12 ENCOUNTER — Other Ambulatory Visit (HOSPITAL_COMMUNITY): Payer: Self-pay | Admitting: Adult Health

## 2014-10-12 DIAGNOSIS — I5022 Chronic systolic (congestive) heart failure: Secondary | ICD-10-CM

## 2014-10-26 ENCOUNTER — Telehealth: Payer: Self-pay | Admitting: Cardiology

## 2014-10-26 ENCOUNTER — Ambulatory Visit (INDEPENDENT_AMBULATORY_CARE_PROVIDER_SITE_OTHER): Payer: Medicare Other | Admitting: *Deleted

## 2014-10-26 DIAGNOSIS — I429 Cardiomyopathy, unspecified: Secondary | ICD-10-CM

## 2014-10-26 DIAGNOSIS — I5022 Chronic systolic (congestive) heart failure: Secondary | ICD-10-CM

## 2014-10-26 DIAGNOSIS — I428 Other cardiomyopathies: Secondary | ICD-10-CM

## 2014-10-26 LAB — MDC_IDC_ENUM_SESS_TYPE_REMOTE
Brady Statistic RV Percent Paced: 0.06 %
Date Time Interrogation Session: 20160121205312
HighPow Impedance: 73 Ohm
Lead Channel Impedance Value: 340 Ohm
Lead Channel Setting Pacing Amplitude: 2.5 V
MDC IDC MSMT BATTERY VOLTAGE: 2.78 V
MDC IDC MSMT LEADCHNL RV SENSING INTR AMPL: 5.799
MDC IDC SET LEADCHNL RV PACING PULSEWIDTH: 0.4 ms
MDC IDC SET LEADCHNL RV SENSING SENSITIVITY: 0.3 mV
MDC IDC SET ZONE DETECTION INTERVAL: 300 ms
MDC IDC SET ZONE DETECTION INTERVAL: 400 ms
Zone Setting Detection Interval: 340 ms

## 2014-10-26 NOTE — Progress Notes (Signed)
Remote ICD transmission.   

## 2014-10-26 NOTE — Telephone Encounter (Signed)
LMOVM reminding pt to send remote transmission.   

## 2014-11-02 ENCOUNTER — Encounter: Payer: Self-pay | Admitting: Cardiology

## 2014-11-09 ENCOUNTER — Encounter: Payer: Self-pay | Admitting: Internal Medicine

## 2014-11-18 ENCOUNTER — Other Ambulatory Visit (HOSPITAL_COMMUNITY): Payer: Self-pay | Admitting: Anesthesiology

## 2014-11-23 ENCOUNTER — Other Ambulatory Visit (HOSPITAL_COMMUNITY): Payer: Self-pay | Admitting: Anesthesiology

## 2014-11-28 ENCOUNTER — Encounter: Payer: Self-pay | Admitting: Internal Medicine

## 2014-12-13 ENCOUNTER — Encounter: Payer: Self-pay | Admitting: Pulmonary Disease

## 2014-12-13 ENCOUNTER — Ambulatory Visit (INDEPENDENT_AMBULATORY_CARE_PROVIDER_SITE_OTHER): Payer: Medicare Other | Admitting: Pulmonary Disease

## 2014-12-13 VITALS — BP 124/70 | HR 76 | Temp 97.0°F | Ht 65.5 in | Wt 317.6 lb

## 2014-12-13 DIAGNOSIS — G4733 Obstructive sleep apnea (adult) (pediatric): Secondary | ICD-10-CM

## 2014-12-13 NOTE — Progress Notes (Signed)
   Subjective:    Patient ID: Gary Hutchinson, male    DOB: 1960/12/13, 54 y.o.   MRN: [id number]  HPI Patient comes in today for follow-up of his known severe obstructive sleep apnea. He is wearing his bilevel compliantly, and feels that he is sleeping well with his device. He is satisfied with his daytime alertness, and has even lost 31 pounds since the last visit.   Review of Systems  Constitutional: Negative for fever and unexpected weight change.  HENT: Negative for congestion, dental problem, ear pain, nosebleeds, postnasal drip, rhinorrhea, sinus pressure, sneezing, sore throat and trouble swallowing.   Eyes: Negative for redness and itching.  Respiratory: Negative for cough, chest tightness, shortness of breath and wheezing.   Cardiovascular: Negative for palpitations and leg swelling.  Gastrointestinal: Negative for nausea and vomiting.  Genitourinary: Negative for dysuria.  Musculoskeletal: Negative for joint swelling.  Skin: Negative for rash.  Neurological: Negative for headaches.  Hematological: Does not bruise/bleed easily.  Psychiatric/Behavioral: Negative for dysphoric mood. The patient is not nervous/anxious.        Objective:   Physical Exam Morbidly obese male in no acute distress Nose without purulence or discharge noted No skin breakdown or pressure necrosis from the C Pap mask Neck without lymphadenopathy or thyromegaly Lower extremities with mild edema, no cyanosis Alert, does not appear to be sleepy, moves all 4 extremities.       Assessment & Plan:

## 2014-12-13 NOTE — Assessment & Plan Note (Signed)
The patient continues to do very well on his bilevel device, and has even lost 31 pounds since the last visit. Congratulated him on this, and asked him to continue. He also needs to keep up with his mask cushion changes and supplies. If doing well, I will see him back in one year.

## 2014-12-13 NOTE — Patient Instructions (Signed)
Continue to work on weight loss.  You are doing great!! Stay on bilevel, and keep up with mask changes and supplies. followup with me again in one year.

## 2014-12-20 ENCOUNTER — Telehealth: Payer: Self-pay | Admitting: Pulmonary Disease

## 2014-12-20 DIAGNOSIS — G4733 Obstructive sleep apnea (adult) (pediatric): Secondary | ICD-10-CM

## 2014-12-20 NOTE — Telephone Encounter (Signed)
Pt requesting new BiPAP machine, mask and supplies. Pt states that his current machine cuts off and on through out the night - not functioning properly.  DME [facility] in [place] Please advise Dr Shelle Iron. Thanks.

## 2014-12-20 NOTE — Telephone Encounter (Signed)
Order has been placed and pt is aware. nothing further needed

## 2014-12-20 NOTE — Telephone Encounter (Signed)
Ok to send order for resmed auto bilevel device with h/h and climate control tubing.  Set on same pressure level Enroll in [place].

## 2015-01-02 ENCOUNTER — Other Ambulatory Visit (HOSPITAL_COMMUNITY): Payer: Self-pay | Admitting: Anesthesiology

## 2015-01-16 ENCOUNTER — Encounter (HOSPITAL_COMMUNITY): Payer: Medicare Other

## 2015-02-02 ENCOUNTER — Ambulatory Visit (INDEPENDENT_AMBULATORY_CARE_PROVIDER_SITE_OTHER): Payer: Medicare Other | Admitting: Pulmonary Disease

## 2015-02-02 ENCOUNTER — Encounter: Payer: Self-pay | Admitting: Pulmonary Disease

## 2015-02-02 ENCOUNTER — Encounter (HOSPITAL_COMMUNITY): Payer: Medicare Other

## 2015-02-02 VITALS — BP 124/74 | HR 66 | Temp 97.6°F | Ht 66.0 in | Wt 307.0 lb

## 2015-02-02 DIAGNOSIS — G4733 Obstructive sleep apnea (adult) (pediatric): Secondary | ICD-10-CM

## 2015-02-02 NOTE — Patient Instructions (Signed)
Continue on your bipap, and work on weight loss Keep apptm per prior visit.

## 2015-02-02 NOTE — Progress Notes (Signed)
   Subjective:    Patient ID: Gary Hutchinson, male    DOB: [birth year], 54 y.o.   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 I just saw him in March, and we ordered a new bilevel device since his quit working. He has been on this for years, and continues to do well with his new device. His download shows good compliance and good control of his AHI. It is really unclear to me why he even had to come back today.   Review of Systems  Constitutional: Negative for fever and unexpected weight change.  HENT: Negative for congestion, dental problem, ear pain, nosebleeds, postnasal drip, rhinorrhea, sinus pressure, sneezing, sore throat and trouble swallowing.   Eyes: Negative for redness and itching.  Respiratory: Negative for cough, chest tightness, shortness of breath and wheezing.   Cardiovascular: Negative for palpitations and leg swelling.  Gastrointestinal: Negative for nausea and vomiting.  Genitourinary: Negative for dysuria.  Musculoskeletal: Negative for joint swelling.  Skin: Negative for rash.  Neurological: Negative for headaches.  Hematological: Does not bruise/bleed easily.  Psychiatric/Behavioral: Negative for dysphoric mood. The patient is not nervous/anxious.        Objective:   Physical Exam Obese male in no acute distress No skin breakdown or pressure necrosis from the C Pap mask       Assessment & Plan:

## 2015-02-02 NOTE — Assessment & Plan Note (Signed)
The patient continues to do well on his bilevel device by the download, and he is enjoying his new device.

## 2015-02-06 ENCOUNTER — Encounter: Payer: Self-pay | Admitting: *Deleted

## 2015-02-12 ENCOUNTER — Encounter (HOSPITAL_COMMUNITY): Payer: Self-pay

## 2015-02-12 ENCOUNTER — Ambulatory Visit (HOSPITAL_COMMUNITY)
Admission: RE | Admit: 2015-02-12 | Discharge: 2015-02-12 | Disposition: A | Discharge status: Home / Self Care | Payer: Medicare Other | Source: Ambulatory Visit | Attending: Cardiology | Admitting: Cardiology

## 2015-02-12 VITALS — BP 113/64 | HR 70 | Resp 18 | Wt 307.1 lb

## 2015-02-12 DIAGNOSIS — G4733 Obstructive sleep apnea (adult) (pediatric): Secondary | ICD-10-CM

## 2015-02-12 DIAGNOSIS — Z794 Long term (current) use of insulin: Secondary | ICD-10-CM | POA: Insufficient documentation

## 2015-02-12 DIAGNOSIS — I129 Hypertensive chronic kidney disease with stage 1 through stage 4 chronic kidney disease, or unspecified chronic kidney disease: Secondary | ICD-10-CM | POA: Diagnosis not present

## 2015-02-12 DIAGNOSIS — I5022 Chronic systolic (congestive) heart failure: Secondary | ICD-10-CM

## 2015-02-12 DIAGNOSIS — Z95810 Presence of automatic (implantable) cardiac defibrillator: Secondary | ICD-10-CM | POA: Insufficient documentation

## 2015-02-12 DIAGNOSIS — Z7901 Long term (current) use of anticoagulants: Secondary | ICD-10-CM | POA: Insufficient documentation

## 2015-02-12 DIAGNOSIS — I272 Other secondary pulmonary hypertension: Secondary | ICD-10-CM | POA: Insufficient documentation

## 2015-02-12 DIAGNOSIS — I480 Paroxysmal atrial fibrillation: Secondary | ICD-10-CM

## 2015-02-12 DIAGNOSIS — Z79899 Other long term (current) drug therapy: Secondary | ICD-10-CM | POA: Diagnosis not present

## 2015-02-12 DIAGNOSIS — N183 Chronic kidney disease, stage 3 (moderate): Secondary | ICD-10-CM | POA: Diagnosis not present

## 2015-02-12 DIAGNOSIS — M109 Gout, unspecified: Secondary | ICD-10-CM | POA: Diagnosis not present

## 2015-02-12 DIAGNOSIS — E119 Type 2 diabetes mellitus without complications: Secondary | ICD-10-CM | POA: Insufficient documentation

## 2015-02-12 LAB — BASIC METABOLIC PANEL
Anion gap: 11 (ref 5–15)
BUN: 39 mg/dL — AB (ref 6–20)
CALCIUM: 9.6 mg/dL (ref 8.9–10.3)
CO2: 28 mmol/L (ref 22–32)
Chloride: 97 mmol/L — ABNORMAL LOW (ref 101–111)
Creatinine, Ser: 1.7 mg/dL — ABNORMAL HIGH (ref 0.61–1.24)
GFR calc Af Amer: 51 mL/min — ABNORMAL LOW (ref >60)
GFR, EST NON AFRICAN AMERICAN: 44 mL/min — AB (ref >60)
GLUCOSE: 162 mg/dL — AB (ref 70–99)
POTASSIUM: 3.4 mmol/L — AB (ref 3.5–5.1)
SODIUM: 136 mmol/L (ref 135–145)

## 2015-02-12 NOTE — Patient Instructions (Signed)
Follow up 3 months with echocardiogram.  Do the following things EVERYDAY: 1) Weigh yourself in the morning before breakfast. Write it down and keep it in a log. 2) Take your medicines as prescribed 3) Eat low salt foods-Limit salt (sodium) to 2000 mg per day.  4) Stay as active as you can everyday 5) Limit all fluids for the day to less than 2 liters  

## 2015-02-12 NOTE — Progress Notes (Signed)
Patient ID: Gary Hutchinson, male   DOB: 1961/02/02, 54 y.o.   MRN: [id number] PCP: Creola Corn EP: Hillis Range  HPI: Gary Hutchinson is a 54 year old male with a history of chronic systolic heart failure, ICM s/p ICD, HTN, DM II, OSA on CPAP, history of ventricular tachycardia status post AICD placement in 2009, atrial fibrillation and morbid obesity.    Admitted 6/1-03/13/14 for A/C HF and cardiogenic shock (co-ox 42%). Diuresed with IV lasix and milrinone which were weaned off.  He went into Afib with RVR and chemically cardioverted back to NSR with IV amiodarone. Placed on Xarelto. BB restarted 1/2 dose and held ACE-I. His blood sugars were elevated along with Hgb A1C and started on insulin. Discharge weight 344 lbs.   RHC (05/2014): Left sided filling pressures well compensated, mild pulm HTN with elevated right-sided pressures and mod/severely depressed CO. Discussion about trial of milrinone but patient wanted to hold off.  Follow up for Heart Failure: Overall he is feeling pretty good. Denies SOB/PND/Orthopnea. No difficulty with ADLs.  Weighing about once a week. Last weight at home was 306 pounds. Says he is busy every day and walks throughout the day.  Taking all medications. He has cut back on his portions.  Following low salt diet and limiting fluid intake to< 2 liters per day.    Echo  05/03/12: EF 25-30%.  Grade 1 diastolic dysfunction.  Mild MR.  Mod dilated LA.   07/27/13: EF 40%, diff HK, MR mild, LA mild/mod dilated  03/2014: EF30-35%, grade II DD, mild MR, RV systolic fx mildly decreased   Labs  03/08/13 K 3.8 Creatinine 1.02  07/27/13 K 4.7 Creatinine 1.61 Dig level 0.9 ---> dig cut back to 0.125 mg every other day 10/10/13 K 3.7 Creatinine 1.4  03/17/14: K 3.8, Creatinine 1.8, BUN 29 (PCP office) 05/10/14: K 4.0 Cr 2.4 05/18/14: K 4.1, Cr 1.8, BUN 34, dig level 0.7,  06/06/14: K 4.2 Creatinine 1.34  08/21/2014: K 4.1 Creatinine 1.46 Dig level 0.7  Optivol- No Vt Afib. Activity ~ 2 hours  per day. Fluid below threshold  SH: Lives with son in [place]. Disabled. No ETOH or tobacco abuse  FH; Mother living: HT        Father deceased: was not part of his life so not sure health issues  ROS: All systems negative except as listed in HPI, PMH and Problem List.  Past Medical History  Diagnosis Date  . Nonischemic cardiomyopathy     a. LHC (02/2011) normal coronaries  . Ventricular tachycardia     s/p MDT ICD implant  . Alcohol abuse     now quit  . Pulmonary hypertension   . Diverticulosis of colon   . Hemorrhoids, internal   . Cholecystitis   . Morbid obesity   . Obstructive sleep apnea   . Anemia     iron defi  . Hypertension   . Gout   . Diabetes mellitus   . Chronic systolic congestive heart failure     a. RHC (02/2011) RA 31/27, RV 71/27, PA67/45, PCWP 46, PA 49%, Fick CO: 3.4 b. ECHO (03/2014) EF 30-35%, grade II DD, mild MR, RV poorly visualized appears mildly decreased c. RHC (05/2014) RA 13, RV 54/4/11, PA 60/22 (37), PCWP 17, Fick CO/CI: 4.4 / 1.9, Thermo CO/CI: 3.8 /1.6, PVR 5.2 WU, PA 57% and 59%  . Asthma     Current Outpatient Prescriptions  Medication Sig Dispense Refill  . acetaminophen (TYLENOL) 325 MG tablet Take  650 mg by mouth every 6 (six) hours as needed (pain).    Marland Kitchen albuterol (PROAIR HFA) 108 (90 BASE) MCG/ACT inhaler Inhale 2 puffs into the lungs every 6 (six) hours as needed. For wheezing or shortness of breath    . allopurinol (ZYLOPRIM) 100 MG tablet Take 3 tablets by mouth daily.     Marland Kitchen amiodarone (PACERONE) 200 MG tablet TAKE 1 TABLET (200 MG TOTAL) BY MOUTH 2 (TWO) TIMES DAILY. 60 tablet 3  . atorvastatin (LIPITOR) 40 MG tablet Take 1 tablet (40 mg total) by mouth daily. 90 tablet 6  . bisoprolol (ZEBETA) 10 MG tablet Take 0.5 tablets (5 mg total) by mouth daily. 15 tablet 6  . colchicine 0.6 MG tablet Take 0.6 mg by mouth daily. Gout flare ups    . digoxin (LANOXIN) 0.25 MG tablet TAKE 0.5 TABLETS (125 MCG TOTAL) BY MOUTH EVERY OTHER DAY.  15 tablet 5  . fluticasone (FLONASE) 50 MCG/ACT nasal spray Place 2 sprays into the nose as needed for allergies.     . Fluticasone-Salmeterol (ADVAIR DISKUS) 250-50 MCG/DOSE AEPB Inhale 1 puff into the lungs every 12 (twelve) hours as needed. For wheezing or shortness of breath    . hydrALAZINE (APRESOLINE) 50 MG tablet Take 50 mg by mouth 2 (two) times daily.    . insulin detemir (LEVEMIR) 100 UNIT/ML injection Inject 28 Units into the skin at bedtime.    . isosorbide mononitrate (IMDUR) 30 MG 24 hr tablet TAKE 1 TABLET (30 MG TOTAL) BY MOUTH 2 (TWO) TIMES DAILY. 60 tablet 3  . linagliptin (TRADJENTA) 5 MG TABS tablet Take 1 tablet (5 mg total) by mouth daily. 30 tablet 3  . metolazone (ZAROXOLYN) 2.5 MG tablet Take 2.5 mg by mouth daily as needed (edema).    . montelukast (SINGULAIR) 10 MG tablet Take 10 mg by mouth daily as needed. For shortness of breath or wheezing    . potassium chloride SA (K-DUR,KLOR-CON) 20 MEQ tablet Take 20 mEq by mouth daily.    . rivaroxaban (XARELTO) 15 MG TABS tablet Take 1 tablet (15 mg total) by mouth daily. 30 tablet 6  . spironolactone (ALDACTONE) 25 MG tablet Take 0.5 tablets (12.5 mg total) by mouth daily. 15 tablet 3  . torsemide (DEMADEX) 20 MG tablet TAKE 40 MG (2 TABLETS) EVERY MORNING AND 20 MG (1 TABLET) EVERY EVENING 120 tablet 3  . traMADol (ULTRAM) 50 MG tablet Take 50 mg by mouth every 8 (eight) hours as needed (pain).     No current facility-administered medications for this encounter.    Filed Vitals:   02/12/15 1026  BP: 113/64  Pulse: 70  Resp: 18  Weight: 307 lb 2 oz (139.311 kg)  SpO2: 95%    PHYSICAL EXAM: General:  Well appearing. No resp difficulty, NAD. Ambulated in the clinic without difficulty.  HEENT: normal Neck: Thick. JVP difficult to assess d/t body but appears mildly elevated; Carotids 2+ bilaterally; no bruits. No lymphadenopathy or thryomegaly appreciated. Cor: PMI nonpalpable. Regular rate & rhythm. No rubs or  gallops. 2/6 TR murmur. 2/6 MR Lungs: clear Abdomen: obese, soft, nontender, non-distended.  Good bowel sounds. Extremities: no cyanosis, clubbing, rash, no edema. Neuro: alert & orientedx3, cranial nerves grossly intact. Moves all 4 extremities w/o difficulty. Affect pleasant.  ASSESSMENT & PLAN:   1) Chronic systolic HF: NICM s/p ICD Medtronic, EF 30-35% (03/2014).   - Overall doing well. NYHA II symptoms and volume status stable.  Optivol- volume below threshold for now  but seems to have increased above threshold when he travels. Encouraged to follow low salt diet when out of town.  Continue torsemide 40 mg/20 mg with an extra 20 mg if needed.   - Continue bisoprolol 5 mg daily. Can consider increase next visit.  - Continue digoxin every other day. Last dig level  08/2014 -->0.7  -Continue  12.5 mg spiro daily.  -Continue hydralazine 50  mg twice a day. Continue Imdur 30 mg BID. Will not add ace due to CKD.  -Check BMET today.  - Reinforced the need and importance of daily weights, a low sodium diet, and fluid restriction (less than 2 L a day). Instructed to call the HF clinic if weight increases more than 3 lbs overnight or 5 lbs in a week.  2) CKD stage III -  Followed by nephrology.  3) HTN: controlled. Continue current medications 4) OSA - Continue to wear CPAP nightly 5) PAF- New to patient 03/2014. Appears to be in NSR and no Afib on optivol. Continue amio 200 daily. Continue Xarelto 15 mg daily d/t renal function. C Needs yearly eye exam and he is aware.  6) Obesity:  Has lost about 50 pounds. Now has stabilized. Encouraged to cut back on portions.   Follow up in 3 months with an ECHO and check optivol.    CLEGG,AMY NP-C 02/12/2015 10:36 AM

## 2015-02-16 ENCOUNTER — Telehealth (HOSPITAL_COMMUNITY): Payer: Self-pay

## 2015-02-16 NOTE — Telephone Encounter (Signed)
Recent serum K low at 3.4  Patient states he is compliant with potassium.  Advised to take extra 20 meq potassium today only per Amy Clegg NP-C.  Aware and agreeable.

## 2015-03-19 ENCOUNTER — Encounter: Payer: Self-pay | Admitting: Adult Health

## 2015-03-26 ENCOUNTER — Ambulatory Visit (INDEPENDENT_AMBULATORY_CARE_PROVIDER_SITE_OTHER): Payer: Medicare Other | Admitting: Internal Medicine

## 2015-03-26 ENCOUNTER — Encounter: Payer: Self-pay | Admitting: Internal Medicine

## 2015-03-26 ENCOUNTER — Other Ambulatory Visit: Payer: Self-pay

## 2015-03-26 VITALS — BP 116/66 | HR 71 | Ht 65.5 in | Wt 311.8 lb

## 2015-03-26 DIAGNOSIS — I5023 Acute on chronic systolic (congestive) heart failure: Secondary | ICD-10-CM | POA: Diagnosis not present

## 2015-03-26 DIAGNOSIS — I480 Paroxysmal atrial fibrillation: Secondary | ICD-10-CM

## 2015-03-26 DIAGNOSIS — I5022 Chronic systolic (congestive) heart failure: Secondary | ICD-10-CM | POA: Diagnosis not present

## 2015-03-26 DIAGNOSIS — I472 Ventricular tachycardia, unspecified: Secondary | ICD-10-CM

## 2015-03-26 LAB — CUP PACEART INCLINIC DEVICE CHECK
Battery Voltage: 2.64 V
Date Time Interrogation Session: 20160620102834
HighPow Impedance: 80 Ohm
Lead Channel Impedance Value: 368 Ohm
Lead Channel Pacing Threshold Amplitude: 1 V
Lead Channel Pacing Threshold Pulse Width: 0.4 ms
Lead Channel Sensing Intrinsic Amplitude: 6.4813
Lead Channel Setting Pacing Pulse Width: 0.4 ms
MDC IDC SET LEADCHNL RV PACING AMPLITUDE: 2.5 V
MDC IDC SET LEADCHNL RV SENSING SENSITIVITY: 0.3 mV
MDC IDC SET ZONE DETECTION INTERVAL: 340 ms
MDC IDC STAT BRADY RV PERCENT PACED: 0.05 %
Zone Setting Detection Interval: 300 ms
Zone Setting Detection Interval: 400 ms

## 2015-03-26 NOTE — Patient Instructions (Signed)
Medication Instructions:  Your physician recommends that you continue on your current medications as directed. Please refer to the Current Medication list given to you today.   Labwork: None ordered  Testing/Procedures: None ordered  Follow-Up: Remote monitoring is used to monitor your Pacemaker orICD from home. This monitoring reduces the number of office visits required to check your device to one time per year. It allows [place] to keep an eye on the functioning of your device to ensure it is working properly. You are scheduled for a device check from home on 04/25/15. You may send your transmission at any time that day. If you have a wireless device, the transmission will be sent automatically. After your physician reviews your transmission, you will receive a postcard with your next transmission date.  Your physician wants you to follow-up in: 12 months with Dr Jacquiline Doe will receive a reminder letter in the mail two months in advance. If you don't receive a letter, please call our office to schedule the follow-up appointment.     Any Other Special Instructions Will Be Listed Below (If Applicable).

## 2015-03-27 ENCOUNTER — Encounter: Payer: Self-pay | Admitting: Internal Medicine

## 2015-03-27 NOTE — Progress Notes (Signed)
Electrophysiology Office Note   Date:  03/27/2015   ID:  Gary Hutchinson, DOB 18-Sep-1961, MRN [id number]  PCP:  [name], MD  Cardiologist:  CHF clinic Primary Electrophysiologist: Hillis Range, MD    Chief Complaint  Patient presents with  . PAF  . Chronic systolic CHF     History of Present Illness: Gary Hutchinson is a 54 y.o. male who presents today for electrophysiology evaluation.   Doing well at this time.  Denies any recent ICD shocks.  Reports edema and SOB occasionally but feels at him baseline presently.  Remains active.  Trying to lose weight.  Hasnt been seen in the device clinic in several years.  Today, he denies symptoms of palpitations, chest pain,  orthopnea, PND,  claudication, dizziness, presyncope, syncope, bleeding, or neurologic sequela. The patient is tolerating medications without difficulties and is otherwise without complaint today.    Past Medical History  Diagnosis Date  . Nonischemic cardiomyopathy     a. LHC (02/2011) normal coronaries  . Ventricular tachycardia     s/p MDT ICD implant  . Alcohol abuse     now quit  . Pulmonary hypertension   . Diverticulosis of colon   . Hemorrhoids, internal   . Cholecystitis   . Morbid obesity   . Obstructive sleep apnea   . Anemia     iron defi  . Hypertension   . Gout   . Diabetes mellitus   . Chronic systolic congestive heart failure     a. RHC (02/2011) RA 31/27, RV 71/27, PA67/45, PCWP 46, PA 49%, Fick CO: 3.4 b. ECHO (03/2014) EF 30-35%, grade II DD, mild MR, RV poorly visualized appears mildly decreased c. RHC (05/2014) RA 13, RV 54/4/11, PA 60/22 (37), PCWP 17, Fick CO/CI: 4.4 / 1.9, Thermo CO/CI: 3.8 /1.6, PVR 5.2 WU, PA 57% and 59%  . Asthma    Past Surgical History  Procedure Laterality Date  . Icd implantation      ICD- Medtronic 9/09  . Cardiac catheterization  03/08/2004    EF 25-30%  . US echocardiography  08/22/2008    EF 30-35%  . Transthoracic echocardiogram  05/27/2008    EF  30-35%  . Right heart catheterization N/A 05/23/2014    Procedure: RIGHT HEART CATH;  Surgeon: [name], MD;  [facility] C[facility] LAB;  Service: Cardiovascular;  Laterality: N/A;     Current Outpatient Prescriptions  Medication Sig Dispense Refill  . acetaminophen (TYLENOL) 325 MG tablet Take 650 mg by mouth every 6 (six) hours as needed (pain).    Marland Kitchen albuterol (PROAIR HFA) 108 (90 BASE) MCG/ACT inhaler Inhale 2 puffs into the lungs every 6 (six) hours as needed. For wheezing or shortness of breath    . allopurinol (ZYLOPRIM) 100 MG tablet Take 3 tablets by mouth daily.     Marland Kitchen amiodarone (PACERONE) 100 MG tablet Take 100 mg by mouth daily.    Marland Kitchen atorvastatin (LIPITOR) 40 MG tablet Take 1 tablet (40 mg total) by mouth daily. 90 tablet 6  . bisoprolol (ZEBETA) 10 MG tablet Take 0.5 tablets (5 mg total) by mouth daily. 15 tablet 6  . colchicine 0.6 MG tablet Take 0.6 mg by mouth daily as needed. Gout flare ups    . digoxin (LANOXIN) 0.25 MG tablet TAKE 0.5 TABLETS (125 MCG TOTAL) BY MOUTH EVERY OTHER DAY. 15 tablet 5  . fluticasone (FLONASE) 50 MCG/ACT nasal spray Place 2 sprays into the nose daily as needed for  allergies.     . Fluticasone-Salmeterol (ADVAIR DISKUS) 250-50 MCG/DOSE AEPB Inhale 1 puff into the lungs every 12 (twelve) hours as needed. For wheezing or shortness of breath    . hydrALAZINE (APRESOLINE) 50 MG tablet Take 50 mg by mouth 2 (two) times daily.    . insulin detemir (LEVEMIR) 100 UNIT/ML injection Inject 28 Units into the skin at bedtime.    . isosorbide mononitrate (IMDUR) 30 MG 24 hr tablet TAKE 1 TABLET (30 MG TOTAL) BY MOUTH 2 (TWO) TIMES DAILY. 60 tablet 3  . linagliptin (TRADJENTA) 5 MG TABS tablet Take 1 tablet (5 mg total) by mouth daily. 30 tablet 3  . metolazone (ZAROXOLYN) 2.5 MG tablet Take 2.5 mg by mouth daily as needed (edema).    . montelukast (SINGULAIR) 10 MG tablet Take 10 mg by mouth daily as needed. For shortness of breath or wheezing    .  potassium chloride SA (K-DUR,KLOR-CON) 20 MEQ tablet Take 20 mEq by mouth daily.    . rivaroxaban (XARELTO) 15 MG TABS tablet Take 1 tablet (15 mg total) by mouth daily. 30 tablet 6  . spironolactone (ALDACTONE) 25 MG tablet Take 0.5 tablets (12.5 mg total) by mouth daily. 15 tablet 3  . torsemide (DEMADEX) 20 MG tablet TAKE 40 MG (2 TABLETS) EVERY MORNING AND 20 MG (1 TABLET) EVERY EVENING (Patient taking differently: TAKE 40 MG (2 TABLETS) BY MOUTH EVERY MORNING AND 20 MG (1 TABLET) BY MOUTH EVERY EVENING) 120 tablet 3  . traMADol (ULTRAM) 50 MG tablet Take 50 mg by mouth every 8 (eight) hours as needed (pain).     No current facility-administered medications for this visit.    Allergies:   Review of patient's allergies indicates no known allergies.   Social History:  The patient  reports that he has never smoked. He does not have any smokeless tobacco history on file. He reports that he does not drink alcohol or use illicit drugs.   Family History:  The patient's family history includes Cancer in his father; Diabetes in an other family member; Hypertension in his mother.    ROS:  Please see the history of present illness.   All other systems are reviewed and negative.    PHYSICAL EXAM: VS:  BP 116/66 mmHg  Pulse 71  Ht 5' 5.5" (1.664 m)  Wt 141.432 kg (311 lb 12.8 oz)  BMI 51.08 kg/m2 , BMI Body mass index is 51.08 kg/(m^2). GEN: overweight in no acute distress HEENT: normal Neck: + JVD, no carotid bruits, or masses Cardiac: RRR; no murmurs, rubs, or gallops,no edema  Respiratory:  Decreased BS at the bases, normal work of breathing GI: soft, nontender, nondistended, + BS MS: no deformity or atrophy Skin: warm and dry, device pocket is well healed Neuro:  Strength and sensation are intact Psych: euthymic mood, full affect  EKG:  EKG is ordered today. The ekg ordered today shows sinus rhythm with LBBB (QRS 152 msec)  Device interrogation is reviewed today in detail.  See  PaceArt for details.   Recent Labs: 05/23/2014: Hemoglobin 14.6; Platelets 188 06/01/2014: Pro B Natriuretic peptide (BNP) 2453.0* 08/21/2014: ALT 34; TSH 3.070 02/12/2015: BUN 39*; Creatinine, Ser 1.70*; Potassium 3.4*; Sodium 136    Lipid Panel     Component Value Date/Time   CHOL 181 04/09/2011 0323   TRIG 137 04/09/2011 0323   HDL 37* 04/09/2011 0323   CHOLHDL 4.9 04/09/2011 0323   VLDL 27 04/09/2011 0323   LDLCALC 117* 04/09/2011 0323  Wt Readings from Last 3 Encounters:  03/26/15 141.432 kg (311 lb 12.8 oz)  02/12/15 139.311 kg (307 lb 2 oz)  02/02/15 139.254 kg (307 lb)      Other studies Reviewed: Additional studies/ records that were reviewed today include: CHF clinic notes, echo 03/09/14 reveals EF 30%, severe LA enlargement  ASSESSMENT AND PLAN:  1.  Nonischemic CM/ chronic systolic dysfunction optivol not presently elevated 2 gram sodium restriction and daily weights encouraged Given LBBB with QRS > 150 msec, I have encouraged him to consider upgrade to CRT-D device.  At this time, he is clear that he is not interested in upgrade.  He is approaching ERI battery status.  At this time, he would favor generator change rather than upgrade.  I have encouraged him to continue to contemplate these options in the interim. Normal ICD function See Arita Miss Art report No changes today The importance of monthly carelink transmission due to approaching ERI was discussed today  2. Morbid obesity Weight loss is strongly advised  3. VT No recent arrhythmias  4. AFib On xarelto and amiodarone Given significant atrial enlargement, I worry that he will likely have progressive atrial fibrillation going forward  Follow-up: monthly carelink transmissions, return to see me in 1 year unless he reaches ERI in the interim  Current medicines are reviewed at length with the patient today.   The patient does not have concerns regarding his medicines.  The following changes were made  today:  none  Labs/ tests ordered today include:  Orders Placed This Encounter  Procedures  . Implantable device check     Signed, Hillis Range, MD  03/27/2015 9:19 PM     [facility] [address] [place] [address] [phone] (office) [phone] (fax)

## 2015-04-12 ENCOUNTER — Encounter: Payer: Self-pay | Admitting: Internal Medicine

## 2015-04-12 ENCOUNTER — Other Ambulatory Visit (HOSPITAL_COMMUNITY): Payer: Self-pay | Admitting: Internal Medicine

## 2015-04-20 ENCOUNTER — Other Ambulatory Visit (HOSPITAL_COMMUNITY): Payer: Self-pay | Admitting: Internal Medicine

## 2015-04-25 ENCOUNTER — Ambulatory Visit (INDEPENDENT_AMBULATORY_CARE_PROVIDER_SITE_OTHER): Payer: Medicare Other | Admitting: *Deleted

## 2015-04-25 DIAGNOSIS — Z95810 Presence of automatic (implantable) cardiac defibrillator: Secondary | ICD-10-CM

## 2015-04-25 NOTE — Progress Notes (Signed)
Remote ICD transmission.   

## 2015-04-27 LAB — CUP PACEART REMOTE DEVICE CHECK
Battery Voltage: 2.64 V
HighPow Impedance: 77 Ohm
Lead Channel Impedance Value: 344 Ohm
Lead Channel Setting Pacing Amplitude: 2.5 V
Lead Channel Setting Pacing Pulse Width: 0.4 ms
Lead Channel Setting Sensing Sensitivity: 0.3 mV
MDC IDC MSMT LEADCHNL RV SENSING INTR AMPL: 6.822 mV
MDC IDC SESS DTM: 20160720113044
MDC IDC SET ZONE DETECTION INTERVAL: 400 ms
MDC IDC STAT BRADY RV PERCENT PACED: 0.06 %
Zone Setting Detection Interval: 300 ms
Zone Setting Detection Interval: 340 ms

## 2015-04-29 ENCOUNTER — Other Ambulatory Visit (HOSPITAL_COMMUNITY): Payer: Self-pay | Admitting: Internal Medicine

## 2015-04-30 NOTE — Telephone Encounter (Signed)
[name] REFILL. THANK YOU FOR YOUR TIME. 

## 2015-05-02 ENCOUNTER — Other Ambulatory Visit (HOSPITAL_COMMUNITY): Payer: Self-pay | Admitting: *Deleted

## 2015-05-02 MED ORDER — AMIODARONE HCL 100 MG PO TABS: 100.0000 mg | ORAL_TABLET | Freq: Every day | ORAL | Status: DC

## 2015-05-11 ENCOUNTER — Encounter: Payer: Self-pay | Admitting: Cardiology

## 2015-05-18 ENCOUNTER — Encounter: Payer: Self-pay | Admitting: Internal Medicine

## 2015-05-28 ENCOUNTER — Ambulatory Visit (INDEPENDENT_AMBULATORY_CARE_PROVIDER_SITE_OTHER): Payer: Medicare Other | Admitting: *Deleted

## 2015-05-28 DIAGNOSIS — Z95810 Presence of automatic (implantable) cardiac defibrillator: Secondary | ICD-10-CM

## 2015-05-29 NOTE — Progress Notes (Signed)
Remote ICD transmission.   

## 2015-05-31 LAB — CUP PACEART REMOTE DEVICE CHECK
Battery Voltage: 2.64 V
Brady Statistic RV Percent Paced: 0.08 %
HIGH POWER IMPEDANCE MEASURED VALUE: 90 Ohm
Lead Channel Impedance Value: 352 Ohm
Lead Channel Sensing Intrinsic Amplitude: 6.822 mV
Lead Channel Setting Pacing Amplitude: 2.5 V
Lead Channel Setting Pacing Pulse Width: 0.4 ms
Lead Channel Setting Sensing Sensitivity: 0.3 mV
MDC IDC SESS DTM: 20160822133911
MDC IDC SET ZONE DETECTION INTERVAL: 340 ms
Zone Setting Detection Interval: 300 ms
Zone Setting Detection Interval: 400 ms

## 2015-06-08 ENCOUNTER — Encounter: Payer: Self-pay | Admitting: Cardiology

## 2015-06-13 ENCOUNTER — Other Ambulatory Visit (HOSPITAL_COMMUNITY): Payer: Self-pay | Admitting: Internal Medicine

## 2015-06-20 ENCOUNTER — Encounter: Payer: Self-pay | Admitting: Internal Medicine

## 2015-07-02 ENCOUNTER — Other Ambulatory Visit (HOSPITAL_COMMUNITY): Payer: Self-pay | Admitting: Internal Medicine

## 2015-07-02 ENCOUNTER — Ambulatory Visit (INDEPENDENT_AMBULATORY_CARE_PROVIDER_SITE_OTHER): Payer: Medicare Other | Admitting: *Deleted

## 2015-07-02 DIAGNOSIS — I428 Other cardiomyopathies: Secondary | ICD-10-CM

## 2015-07-02 DIAGNOSIS — I429 Cardiomyopathy, unspecified: Secondary | ICD-10-CM

## 2015-07-02 DIAGNOSIS — I5022 Chronic systolic (congestive) heart failure: Secondary | ICD-10-CM

## 2015-07-03 NOTE — Progress Notes (Signed)
Remote ICD transmission.   

## 2015-07-08 LAB — CUP PACEART REMOTE DEVICE CHECK
Date Time Interrogation Session: 20160926135535
HighPow Impedance: 86 Ohm
Lead Channel Impedance Value: 336 Ohm
Lead Channel Sensing Intrinsic Amplitude: 6.481 mV
Lead Channel Setting Pacing Amplitude: 2.5 V
Lead Channel Setting Pacing Pulse Width: 0.4 ms
Lead Channel Setting Sensing Sensitivity: 0.3 mV
MDC IDC MSMT BATTERY VOLTAGE: 2.62 V
MDC IDC SET ZONE DETECTION INTERVAL: 400 ms
MDC IDC STAT BRADY RV PERCENT PACED: 0.29 %
Zone Setting Detection Interval: 300 ms
Zone Setting Detection Interval: 340 ms

## 2015-07-09 ENCOUNTER — Other Ambulatory Visit (HOSPITAL_COMMUNITY): Payer: Self-pay | Admitting: Internal Medicine

## 2015-07-20 ENCOUNTER — Telehealth: Payer: Self-pay | Admitting: Pulmonary Disease

## 2015-07-20 DIAGNOSIS — G4733 Obstructive sleep apnea (adult) (pediatric): Secondary | ICD-10-CM

## 2015-07-20 NOTE — Telephone Encounter (Signed)
Attempted to call pt. Line was busy. Will try back. 

## 2015-07-20 NOTE — Telephone Encounter (Signed)
Pt is in need of new CPAP supplies. He last saw KC in 12/2014. OV is not due until 12/2015.  VS - can we order new supplies under your name?

## 2015-07-20 NOTE — Telephone Encounter (Signed)
Okay to send order. 

## 2015-07-23 ENCOUNTER — Encounter: Payer: Self-pay | Admitting: *Deleted

## 2015-07-23 NOTE — Telephone Encounter (Signed)
lmomtcb x1 for pt 

## 2015-07-24 NOTE — Telephone Encounter (Signed)
Spoke with pt and advised that order was placed for cpap supplies through [facility].   Advised pt to call back after first of the year to schedule appt with Dr Craige Cotta in March.

## 2015-08-01 ENCOUNTER — Ambulatory Visit (INDEPENDENT_AMBULATORY_CARE_PROVIDER_SITE_OTHER): Payer: Medicare Other | Admitting: *Deleted

## 2015-08-01 DIAGNOSIS — Z95810 Presence of automatic (implantable) cardiac defibrillator: Secondary | ICD-10-CM

## 2015-08-01 NOTE — Progress Notes (Signed)
Remote ICD transmission.   

## 2015-08-02 ENCOUNTER — Encounter (HOSPITAL_COMMUNITY): Payer: Medicare Other | Admitting: Internal Medicine

## 2015-08-07 LAB — CUP PACEART REMOTE DEVICE CHECK
Brady Statistic RV Percent Paced: 0.1 %
Date Time Interrogation Session: 20161026160105
HIGH POWER IMPEDANCE MEASURED VALUE: 88 Ohm
Implantable Lead Implant Date: 20090901
Implantable Lead Location: 753860
Lead Channel Setting Pacing Amplitude: 2.5 V
Lead Channel Setting Pacing Pulse Width: 0.4 ms
MDC IDC LEAD MODEL: 6935
MDC IDC MSMT BATTERY VOLTAGE: 2.62 V
MDC IDC MSMT LEADCHNL RV IMPEDANCE VALUE: 340 Ohm
MDC IDC MSMT LEADCHNL RV SENSING INTR AMPL: 6.14 mV
MDC IDC SET LEADCHNL RV SENSING SENSITIVITY: 0.3 mV

## 2015-08-08 ENCOUNTER — Encounter: Payer: Self-pay | Admitting: Cardiology

## 2015-08-10 ENCOUNTER — Other Ambulatory Visit (HOSPITAL_COMMUNITY): Payer: Self-pay | Admitting: Internal Medicine

## 2015-08-17 IMAGING — CR DG CHEST 1V PORT
1 series · 1 of 1 positions shown · non-contrast
Comparison: 03/06/2014

CLINICAL DATA: Central venous catheter placement

EXAM:
PORTABLE CHEST - 1 VIEW

[AP]
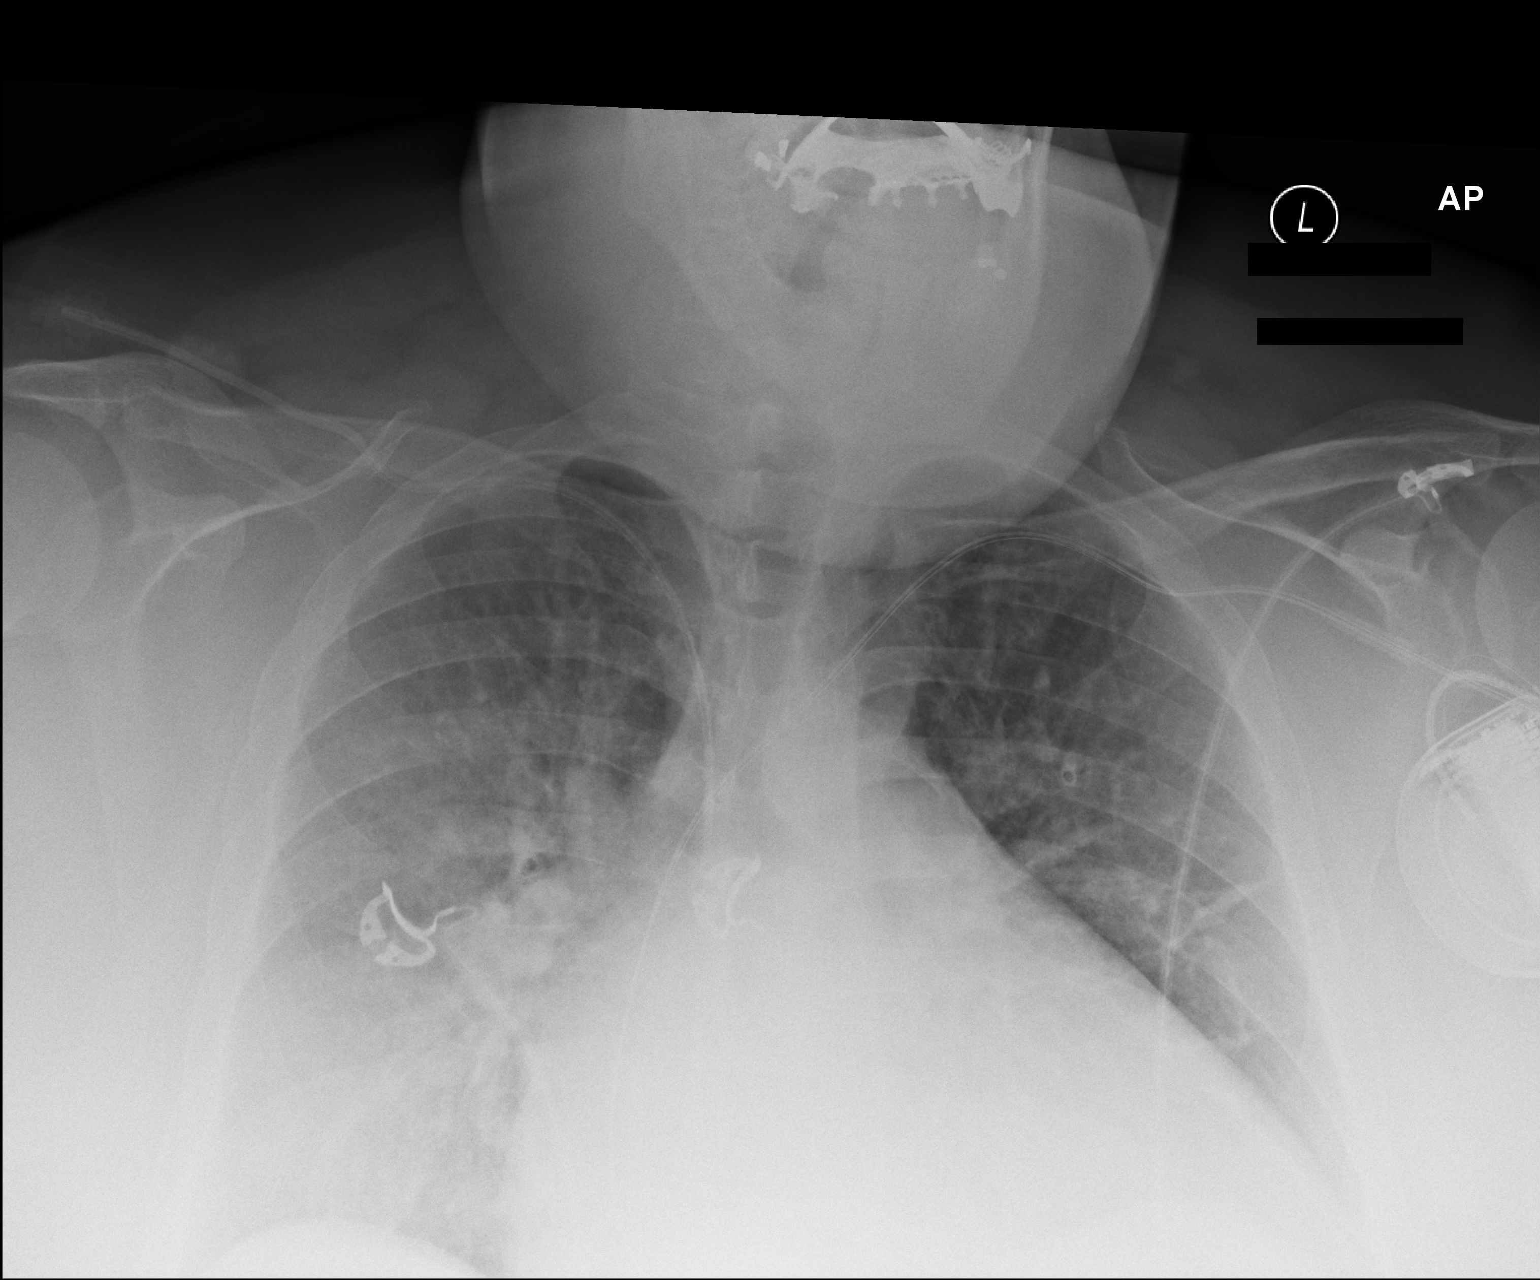

[1 of 1 positions shown; findings below may reference images not displayed]

FINDINGS: Right subclavian central venous catheter placed. Tip is in the lower
SVC. No pneumothorax. Cardiomegaly unchanged. Pulmonary edema is
worse. Left subclavian pacemaker device is partially imaged but not
significantly changed.
IMPRESSION: Right subclavian vein central venous catheter placed with its tip in
the lower SVC and no pneumothorax.

Worsening pulmonary edema.

## 2015-08-28 ENCOUNTER — Encounter (HOSPITAL_COMMUNITY): Payer: Medicare Other | Admitting: Internal Medicine

## 2015-09-03 ENCOUNTER — Telehealth: Payer: Self-pay | Admitting: Cardiology

## 2015-09-03 ENCOUNTER — Ambulatory Visit (INDEPENDENT_AMBULATORY_CARE_PROVIDER_SITE_OTHER): Payer: Medicare Other | Admitting: *Deleted

## 2015-09-03 DIAGNOSIS — Z95810 Presence of automatic (implantable) cardiac defibrillator: Secondary | ICD-10-CM

## 2015-09-03 NOTE — Telephone Encounter (Signed)
LMOVM reminding pt to send remote transmission.   

## 2015-09-04 ENCOUNTER — Encounter: Payer: Self-pay | Admitting: Cardiology

## 2015-09-05 ENCOUNTER — Telehealth: Payer: Self-pay | Admitting: Cardiology

## 2015-09-05 NOTE — Telephone Encounter (Signed)
Spoke w/ pt and informed him that his device has reached RRT. Informed him that he may hear a alert sound and that it will go off for 16 days even when the sound stops he will need to come into the office. I informed him that a schedule will call him to schedule an appt w/ either MD / PA / NP to discuss procedure. Pt verbalized understanding.

## 2015-09-06 NOTE — Progress Notes (Signed)
Remote ICD transmission.   

## 2015-09-12 ENCOUNTER — Other Ambulatory Visit (HOSPITAL_COMMUNITY): Payer: Self-pay | Admitting: Internal Medicine

## 2015-09-13 LAB — CUP PACEART REMOTE DEVICE CHECK
HighPow Impedance: 92 Ohm
Implantable Lead Model: 6935
Lead Channel Impedance Value: 352 Ohm
Lead Channel Setting Pacing Amplitude: 2.5 V
MDC IDC LEAD IMPLANT DT: 20090901
MDC IDC LEAD LOCATION: 753860
MDC IDC MSMT BATTERY VOLTAGE: 2.62 V
MDC IDC MSMT LEADCHNL RV SENSING INTR AMPL: 7.164 mV
MDC IDC SESS DTM: 20161129202045
MDC IDC SET LEADCHNL RV PACING PULSEWIDTH: 0.4 ms
MDC IDC SET LEADCHNL RV SENSING SENSITIVITY: 0.3 mV
MDC IDC STAT BRADY RV PERCENT PACED: 0.17 %

## 2015-09-17 ENCOUNTER — Other Ambulatory Visit (HOSPITAL_COMMUNITY): Payer: Self-pay | Admitting: Internal Medicine

## 2015-09-21 ENCOUNTER — Encounter: Payer: Medicare Other | Admitting: Physician Assistant

## 2015-09-24 ENCOUNTER — Telehealth: Payer: Self-pay | Admitting: Internal Medicine

## 2015-09-24 NOTE — Telephone Encounter (Signed)
Left message for Melissa at [facility] to call back.

## 2015-09-24 NOTE — Telephone Encounter (Signed)
Melissa from [facility] returning call.  Can be reached at [phone].

## 2015-09-24 NOTE — Telephone Encounter (Signed)
Last seen by [facility] on 01/30/15 Has pending appt with CY on 12/24/15  Spoke with pt - states he is in need of new cpap supplies.  States back in Oct 2016 he was advised by our office order was sent to [facility] for this but [facility] is stating they do not have order.  There is an order in Epic.  Advised I would call [facility] to see what further is needed.  Pt verbalized understanding and was very appreciative.  [email or web address] for Melissa with [facility] at [phone].

## 2015-09-26 NOTE — Telephone Encounter (Signed)
Spoke with Efraim Kaufmann  She states needing last ov note faxed  I have faxed this to her to [phone]  Pt aware  Nothing further needed

## 2015-09-28 ENCOUNTER — Encounter (HOSPITAL_COMMUNITY): Payer: Medicare Other | Admitting: Internal Medicine

## 2015-10-04 ENCOUNTER — Encounter: Payer: Self-pay | Admitting: Internal Medicine

## 2015-10-04 ENCOUNTER — Ambulatory Visit (INDEPENDENT_AMBULATORY_CARE_PROVIDER_SITE_OTHER): Payer: Medicare Other | Admitting: Internal Medicine

## 2015-10-04 VITALS — BP 122/68 | HR 82 | Ht 65.5 in | Wt 314.6 lb

## 2015-10-04 DIAGNOSIS — I5023 Acute on chronic systolic (congestive) heart failure: Secondary | ICD-10-CM

## 2015-10-04 DIAGNOSIS — Z95810 Presence of automatic (implantable) cardiac defibrillator: Secondary | ICD-10-CM

## 2015-10-04 DIAGNOSIS — I447 Left bundle-branch block, unspecified: Secondary | ICD-10-CM

## 2015-10-04 DIAGNOSIS — I472 Ventricular tachycardia, unspecified: Secondary | ICD-10-CM

## 2015-10-04 DIAGNOSIS — I5022 Chronic systolic (congestive) heart failure: Secondary | ICD-10-CM

## 2015-10-04 LAB — CUP PACEART INCLINIC DEVICE CHECK
Battery Voltage: 2.62 V
Brady Statistic RV Percent Paced: 0.51 %
Date Time Interrogation Session: 20161229134859
HighPow Impedance: 98 Ohm
Implantable Lead Implant Date: 20090901
Implantable Lead Location: 753860
Lead Channel Impedance Value: 356 Ohm
Lead Channel Pacing Threshold Amplitude: 1.5 V
Lead Channel Pacing Threshold Pulse Width: 0.4 ms
Lead Channel Setting Pacing Amplitude: 2.5 V
Lead Channel Setting Sensing Sensitivity: 0.3 mV
MDC IDC LEAD MODEL: 6935
MDC IDC MSMT LEADCHNL RV SENSING INTR AMPL: 7.846 mV
MDC IDC SET LEADCHNL RV PACING PULSEWIDTH: 0.4 ms

## 2015-10-04 NOTE — Patient Instructions (Signed)
Medication Instructions:  Your physician recommends that you continue on your current medications as directed. Please refer to the Current Medication list given to you today.   Labwork: Your physician recommends that you return for lab work on 11/09/15 at 10:00am  You do not have to fast   Testing/Procedures: BiV ICD upgrade is scheduled for 11/16/15 at 7:30am.  Please check in at the [facility] Entrance at 5:30am.  Do not eat or drink after midnight the night prior to your procedure and hold all medications the morning of your procedure  Hold Xarelto 2 days prior(last dose 11/13/15)  Follow-Up: Your physician recommends that you schedule a follow-up appointment in: 10-14 days from 11/16/15 in device clinic for wound check and 3 months from 11/16/15 with Dr [name]   Any Other Special Instructions Will Be Listed Below (If Applicable).     If you need a refill on your cardiac medications before your next appointment, please call your pharmacy.

## 2015-10-06 DIAGNOSIS — I447 Left bundle-branch block, unspecified: Secondary | ICD-10-CM | POA: Insufficient documentation

## 2015-10-06 NOTE — Progress Notes (Signed)
Electrophysiology Office Note   Date:  10/06/2015   ID:  Gary Hutchinson, DOB 1961/08/31, MRN [id number]  PCP:  [name], MD  Cardiologist:  CHF clinic Primary Electrophysiologist: Hillis Range, MD    Chief Complaint  Patient presents with  . PAF  . VT  . Acute on chronic systolic heart failure  . Chronic systolic CHF     History of Present Illness: Gary Hutchinson is a 54 y.o. male who presents today for electrophysiology evaluation.   Doing well at this time.  Denies any recent ICD shocks.  Reports edema and SOB occasionally but feels at him baseline presently.  Remains active.  Trying to lose weight but without much success.  He has reached ERI battery status.  Today, he denies symptoms of palpitations, chest pain,  orthopnea, PND,  claudication, dizziness, presyncope, syncope, bleeding, or neurologic sequela. The patient is tolerating medications without difficulties and is otherwise without complaint today.    Past Medical History  Diagnosis Date  . Nonischemic cardiomyopathy (HCC)     a. LHC (02/2011) normal coronaries  . Ventricular tachycardia [facility])     s/p MDT ICD implant  . Alcohol abuse     now quit  . Pulmonary hypertension (HCC)   . Diverticulosis of colon   . Hemorrhoids, internal   . Cholecystitis   . Morbid obesity (HCC)   . Obstructive sleep apnea   . Anemia     iron defi  . Hypertension   . Gout   . Diabetes mellitus   . Chronic systolic congestive heart failure (HCC)     a. RHC (02/2011) RA 31/27, RV 71/27, PA67/45, PCWP 46, PA 49%, Fick CO: 3.4 b. ECHO (03/2014) EF 30-35%, grade II DD, mild MR, RV poorly visualized appears mildly decreased c. RHC (05/2014) RA 13, RV 54/4/11, PA 60/22 (37), PCWP 17, Fick CO/CI: 4.4 / 1.9, Thermo CO/CI: 3.8 /1.6, PVR 5.2 WU, PA 57% and 59%  . Asthma   . Paroxysmal atrial fibrillation [facility])    Past Surgical History  Procedure Laterality Date  . Icd implantation      ICD- Medtronic 9/09  . Cardiac  catheterization  03/08/2004    EF 25-30%  . US echocardiography  08/22/2008    EF 30-35%  . Transthoracic echocardiogram  05/27/2008    EF 30-35%  . Right heart catheterization N/A 05/23/2014    Procedure: RIGHT HEART CATH;  Surgeon: [name], MD;  Location: [facility] CATH LAB;  Service: Cardiovascular;  Laterality: N/A;     Current Outpatient Prescriptions  Medication Sig Dispense Refill  . acetaminophen (TYLENOL) 325 MG tablet Take 650 mg by mouth every 6 (six) hours as needed (pain).    Marland Kitchen albuterol (PROAIR HFA) 108 (90 BASE) MCG/ACT inhaler Inhale 2 puffs into the lungs every 6 (six) hours as needed. For wheezing or shortness of breath    . allopurinol (ZYLOPRIM) 100 MG tablet Take 3 tablets by mouth daily.     Marland Kitchen amiodarone (PACERONE) 100 MG tablet Take 1 tablet (100 mg total) by mouth daily. 30 tablet 3  . atorvastatin (LIPITOR) 40 MG tablet Take 1 tablet (40 mg total) by mouth daily. 90 tablet 6  . bisoprolol (ZEBETA) 10 MG tablet TAKE 1/2 TABLETS (5 MG TOTAL) BY MOUTH DAILY. 15 tablet 6  . colchicine 0.6 MG tablet Take 0.6 mg by mouth daily as needed. Gout flare ups    . digoxin (LANOXIN) 0.25 MG tablet TAKE 0.5 TABLETS (125 MCG  TOTAL) BY MOUTH EVERY OTHER DAY. 15 tablet 5  . fluticasone (FLONASE) 50 MCG/ACT nasal spray Place 2 sprays into the nose daily as needed for allergies.     . Fluticasone-Salmeterol (ADVAIR DISKUS) 250-50 MCG/DOSE AEPB Inhale 1 puff into the lungs every 12 (twelve) hours as needed. For wheezing or shortness of breath    . hydrALAZINE (APRESOLINE) 50 MG tablet Take 50 mg by mouth 2 (two) times daily.    . insulin detemir (LEVEMIR) 100 UNIT/ML injection Inject 28 Units into the skin at bedtime.    . isosorbide mononitrate (IMDUR) 30 MG 24 hr tablet TAKE 1 TABLET (30 MG TOTAL) BY MOUTH 2 (TWO) TIMES DAILY. 60 tablet 3  . linagliptin (TRADJENTA) 5 MG TABS tablet Take 1 tablet (5 mg total) by mouth daily. 30 tablet 3  . metolazone (ZAROXOLYN) 2.5 MG tablet Take 2.5  mg by mouth daily as needed (edema).    . montelukast (SINGULAIR) 10 MG tablet Take 10 mg by mouth daily as needed. For shortness of breath or wheezing    . potassium chloride SA (K-DUR,KLOR-CON) 20 MEQ tablet Take 20 mEq by mouth daily.    . rivaroxaban (XARELTO) 15 MG TABS tablet Take 1 tablet (15 mg total) by mouth daily. 30 tablet 6  . spironolactone (ALDACTONE) 25 MG tablet Take 0.5 tablets (12.5 mg total) by mouth daily. 15 tablet 3  . torsemide (DEMADEX) 20 MG tablet TAKE 40 MG (2 TABLETS) EVERY MORNING AND 20 MG (1 TABLET) EVERY EVENING 120 tablet 3  . traMADol (ULTRAM) 50 MG tablet Take 50 mg by mouth every 8 (eight) hours as needed (pain).     No current facility-administered medications for this visit.    Allergies:   Review of patient's allergies indicates no known allergies.   Social History:  The patient  reports that he has never smoked. He does not have any smokeless tobacco history on file. He reports that he does not drink alcohol or use illicit drugs.   Family History:  The patient's family history includes Cancer in his father; Hypertension in his mother.    ROS:  Please see the history of present illness.   All other systems are reviewed and negative.    PHYSICAL EXAM: VS:  BP 122/68 mmHg  Pulse 82  Ht 5' 5.5" (1.664 m)  Wt 314 lb 9.6 oz (142.702 kg)  BMI 51.54 kg/m2 , BMI Body mass index is 51.54 kg/(m^2). GEN: overweight in no acute distress HEENT: normal Neck: + JVD, no carotid bruits, or masses Cardiac: RRR; no murmurs, rubs, or gallops,no edema  Respiratory:  Decreased BS at the bases, normal work of breathing GI: soft, nontender, nondistended, + BS MS: no deformity or atrophy Skin: warm and dry, device pocket is well healed Neuro:  Strength and sensation are intact Psych: euthymic mood, full affect  EKG:  EKG is ordered today. The ekg ordered today shows sinus rhythm with LBBB (QRS 170 msec)  Device interrogation is reviewed today in detail.  See  PaceArt for details.   Recent Labs: 02/12/2015: BUN 39*; Creatinine, Ser 1.70*; Potassium 3.4*; Sodium 136    Lipid Panel     Component Value Date/Time   CHOL 181 04/09/2011 0323   TRIG 137 04/09/2011 0323   HDL 37* 04/09/2011 0323   CHOLHDL 4.9 04/09/2011 0323   VLDL 27 04/09/2011 0323   LDLCALC 117* 04/09/2011 0323     Wt Readings from Last 3 Encounters:  10/04/15 314 lb 9.6 oz (142.702  kg)  03/26/15 311 lb 12.8 oz (141.432 kg)  02/12/15 307 lb 2 oz (139.311 kg)      Other studies Reviewed: Additional studies/ records that were reviewed today include: CHF clinic notes, echo 03/09/14 reveals EF 30%, severe LA enlargement  ASSESSMENT AND PLAN:  1.  Nonischemic CM/ chronic systolic dysfunction 2 gram sodium restriction and daily weights encouraged On good medical therapy Given LBBB with QRS > 150 msec, I have encouraged him to consider upgrade to CRT-D device.  Risks, benefits, and alternatives to ICD upgrade to CRT were discussed in detail today.  The patient understands that risks include but are not limited to bleeding, infection, pneumothorax, perforation, tamponade, vascular damage, renal failure, MI, stroke, death, damage to his existing leads, and lead dislodgement and wishes to proceed.  We will therefore schedule the procedure at the next available time. Obtain an echo at this time  Normal ICD function though he has reached ERI See Pace Art report No changes today  2. Morbid obesity Weight loss is strongly advised  3. VT No recent arrhythmias  4. AFib On xarelto and amiodarone Given significant atrial enlargement, I worry that he will likely have progressive atrial fibrillation going forward Hold xarelto 48 hours prior to ICD upgrade   Today, I have spent  40 minutes with the patient discussing CHF, LBBB, and device upgrade .  More than 50% of the visit time today was spent on this issue.    Current medicines are reviewed at length with the patient today.     The patient does not have concerns regarding his medicines.  The following changes were made today:  none  Labs/ tests ordered today include:  Orders Placed This Encounter  Procedures  . Basic metabolic panel  . CBC with Differential  . Implantable device check  . EKG 12-Lead     Signed, Hillis Range, MD  10/06/2015 9:20 AM     [facility] [address] [place] [address] [phone] (office) [phone] (fax)

## 2015-10-11 ENCOUNTER — Encounter (HOSPITAL_COMMUNITY): Payer: Self-pay | Admitting: Internal Medicine

## 2015-10-11 ENCOUNTER — Other Ambulatory Visit: Payer: Self-pay | Admitting: *Deleted

## 2015-10-11 ENCOUNTER — Ambulatory Visit (HOSPITAL_BASED_OUTPATIENT_CLINIC_OR_DEPARTMENT_OTHER)
Admission: RE | Admit: 2015-10-11 | Discharge: 2015-10-11 | Disposition: A | Discharge status: Home / Self Care | Payer: Medicare Other | Source: Ambulatory Visit | Attending: Internal Medicine | Admitting: Internal Medicine

## 2015-10-11 VITALS — BP 122/72 | HR 62 | Wt 313.8 lb

## 2015-10-11 DIAGNOSIS — I130 Hypertensive heart and chronic kidney disease with heart failure and stage 1 through stage 4 chronic kidney disease, or unspecified chronic kidney disease: Secondary | ICD-10-CM | POA: Diagnosis not present

## 2015-10-11 DIAGNOSIS — I472 Ventricular tachycardia, unspecified: Secondary | ICD-10-CM

## 2015-10-11 DIAGNOSIS — I5022 Chronic systolic (congestive) heart failure: Secondary | ICD-10-CM | POA: Diagnosis not present

## 2015-10-11 DIAGNOSIS — R109 Unspecified abdominal pain: Secondary | ICD-10-CM | POA: Diagnosis not present

## 2015-10-11 LAB — BASIC METABOLIC PANEL WITH GFR
Anion gap: 13 (ref 5–15)
BUN: 55 mg/dL — ABNORMAL HIGH (ref 6–20)
CO2: 27 mmol/L (ref 22–32)
Calcium: 9.7 mg/dL (ref 8.9–10.3)
Chloride: 99 mmol/L — ABNORMAL LOW (ref 101–111)
Creatinine, Ser: 2.1 mg/dL — ABNORMAL HIGH (ref 0.61–1.24)
GFR calc Af Amer: 39 mL/min — ABNORMAL LOW
GFR calc non Af Amer: 34 mL/min — ABNORMAL LOW
Glucose, Bld: 159 mg/dL — ABNORMAL HIGH (ref 65–99)
Potassium: 3.4 mmol/L — ABNORMAL LOW (ref 3.5–5.1)
Sodium: 139 mmol/L (ref 135–145)

## 2015-10-11 LAB — DIGOXIN LEVEL: Digoxin Level: 0.6 ng/mL — ABNORMAL LOW (ref 0.8–2.0)

## 2015-10-11 NOTE — Patient Instructions (Signed)
Labs today  Your physician recommends that you schedule a follow-up appointment in: 3 months with echocardiogram  

## 2015-10-11 NOTE — Progress Notes (Signed)
[facility]  Patient ID: AMAURIS DEBOIS, male   DOB: July 23, 1961, 55 y.o.   MRN: [id number] PCP: Creola Corn EP: Hillis Range  HPI: Mr. Heidelberger is a 55 year old male with a history of chronic systolic heart failure, ICM s/p ICD, HTN, DM II, OSA on CPAP, history of ventricular tachycardia status post AICD placement in 2009, atrial fibrillation and morbid obesity.    Admitted 6/1-03/13/14 for A/C HF and cardiogenic shock (co-ox 42%). Diuresed with IV lasix and milrinone which were weaned off.  He went into Afib with RVR and chemically cardioverted back to NSR with IV amiodarone. Placed on Xarelto. BB restarted 1/2 dose and held ACE-I. His blood sugars were elevated along with Hgb A1C and started on insulin. Discharge weight 344 lbs.   RHC (05/2014): Left sided filling pressures well compensated, mild pulm HTN with elevated right-sided pressures and mod/severely depressed CO. Discussion about trial of milrinone but patient wanted to hold off.  Follow up for Heart Failure: Doing ok. Denies SOB/PND/Orthopnea. Occasional edema. No difficulty with ADLs. Goes out every day to store or whatever else he needs. Still struggling with death of his mother and aunt in October. Saw Dr. [name] in 12/16 and device is at [facility]. Planning upgrade to CRT-D in February. Weighing occasionally. Takes metolazone 1-2x/month when he is swelling. Weighs 307 at home which is stable.  Taking all medications. Trying to be careful with his diet but says it has been a struggle.   Echo  05/03/12: EF 25-30%.  Grade 1 diastolic dysfunction.  Mild MR.  Mod dilated LA.   07/27/13: EF 40%, diff HK, MR mild, LA mild/mod dilated  03/2014: EF30-35%, grade II DD, mild MR, RV systolic fx mildly decreased   Labs  03/08/13 K 3.8 Creatinine 1.02  07/27/13 K 4.7 Creatinine 1.61 Dig level 0.9 ---> dig cut back to 0.125 mg every other day 10/10/13 K 3.7 Creatinine 1.4  03/17/14: K 3.8, Creatinine 1.8, BUN 29 (PCP office) 05/10/14: K 4.0 Cr  2.4 05/18/14: K 4.1, Cr 1.8, BUN 34, dig level 0.7,  06/06/14: K 4.2 Creatinine 1.34  08/21/2014: K 4.1 Creatinine 1.46 Dig level 0.7  Optivol- No Vt Afib. Activity ~ 2 hours per day. Fluid below threshold  SH: Lives with son in [place]. Disabled. No ETOH or tobacco abuse  FH; Mother living: HT        Father deceased: was not part of his life so not sure health issues  ROS: All systems negative except as listed in HPI, PMH and Problem List.  Past Medical History  Diagnosis Date  . Nonischemic cardiomyopathy (HCC)     a. LHC (02/2011) normal coronaries  . Ventricular tachycardia [facility])     s/p MDT ICD implant  . Alcohol abuse     now quit  . Pulmonary hypertension (HCC)   . Diverticulosis of colon   . Hemorrhoids, internal   . Cholecystitis   . Morbid obesity (HCC)   . Obstructive sleep apnea   . Anemia     iron defi  . Hypertension   . Gout   . Diabetes mellitus   . Chronic systolic congestive heart failure (HCC)     a. RHC (02/2011) RA 31/27, RV 71/27, PA67/45, PCWP 46, PA 49%, Fick CO: 3.4 b. ECHO (03/2014) EF 30-35%, grade II DD, mild MR, RV poorly visualized appears mildly decreased c. RHC (05/2014) RA 13, RV 54/4/11, PA 60/22 (37), PCWP 17, Fick CO/CI: 4.4 / 1.9, Thermo CO/CI: 3.8 /1.6,  PVR 5.2 WU, PA 57% and 59%  . Asthma   . Paroxysmal atrial fibrillation [facility])     Current Outpatient Prescriptions  Medication Sig Dispense Refill  . albuterol (PROAIR HFA) 108 (90 BASE) MCG/ACT inhaler Inhale 2 puffs into the lungs every 6 (six) hours as needed. For wheezing or shortness of breath    . allopurinol (ZYLOPRIM) 100 MG tablet Take 3 tablets by mouth daily.     Marland Kitchen amiodarone (PACERONE) 100 MG tablet Take 1 tablet (100 mg total) by mouth daily. 30 tablet 3  . atorvastatin (LIPITOR) 40 MG tablet Take 1 tablet (40 mg total) by mouth daily. 90 tablet 6  . bisoprolol (ZEBETA) 10 MG tablet TAKE 1/2 TABLETS (5 MG TOTAL) BY MOUTH DAILY. 15 tablet 6  . digoxin (LANOXIN) 0.25 MG tablet  TAKE 0.5 TABLETS (125 MCG TOTAL) BY MOUTH EVERY OTHER DAY. 15 tablet 5  . fluticasone (FLONASE) 50 MCG/ACT nasal spray Place 2 sprays into the nose daily as needed for allergies.     . Fluticasone-Salmeterol (ADVAIR DISKUS) 250-50 MCG/DOSE AEPB Inhale 1 puff into the lungs every 12 (twelve) hours as needed. For wheezing or shortness of breath    . hydrALAZINE (APRESOLINE) 50 MG tablet Take 50 mg by mouth 2 (two) times daily.    . insulin detemir (LEVEMIR) 100 UNIT/ML injection Inject 28 Units into the skin at bedtime.    . isosorbide mononitrate (IMDUR) 30 MG 24 hr tablet TAKE 1 TABLET (30 MG TOTAL) BY MOUTH 2 (TWO) TIMES DAILY. 60 tablet 3  . linagliptin (TRADJENTA) 5 MG TABS tablet Take 1 tablet (5 mg total) by mouth daily. 30 tablet 3  . metolazone (ZAROXOLYN) 2.5 MG tablet Take 2.5 mg by mouth daily as needed (edema).    . montelukast (SINGULAIR) 10 MG tablet Take 10 mg by mouth daily as needed. For shortness of breath or wheezing    . potassium chloride SA (K-DUR,KLOR-CON) 20 MEQ tablet Take 20 mEq by mouth daily.    . rivaroxaban (XARELTO) 15 MG TABS tablet Take 1 tablet (15 mg total) by mouth daily. 30 tablet 6  . spironolactone (ALDACTONE) 25 MG tablet Take 0.5 tablets (12.5 mg total) by mouth daily. 15 tablet 3  . torsemide (DEMADEX) 20 MG tablet TAKE 40 MG (2 TABLETS) EVERY MORNING AND 20 MG (1 TABLET) EVERY EVENING 120 tablet 3  . acetaminophen (TYLENOL) 325 MG tablet Take 650 mg by mouth every 6 (six) hours as needed (pain). Reported on 10/11/2015    . colchicine 0.6 MG tablet Take 0.6 mg by mouth daily as needed. Reported on 10/11/2015     No current facility-administered medications for this encounter.    Filed Vitals:   10/11/15 1446  BP: 122/72  Pulse: 62  Weight: 313 lb 12 oz (142.316 kg)  SpO2: 97%    PHYSICAL EXAM: General:  Well appearing. No resp difficulty, NAD. Ambulated in the clinic without difficulty.  HEENT: normal Neck: Thick. JVP difficult to assess d/t body but  appears mildly elevated; Carotids 2+ bilaterally; no bruits. No lymphadenopathy or thryomegaly appreciated. Cor: PMI nonpalpable. Regular rate & rhythm. No rubs or gallops. 2/6 TR murmur. 2/6 MR Lungs: clear Abdomen: obese, soft, nontender, non-distended.  Good bowel sounds. Extremities: no cyanosis, clubbing, rash, tr-1+ edema. Neuro: alert & orientedx3, cranial nerves grossly intact. Moves all 4 extremities w/o difficulty. Affect pleasant.  ASSESSMENT & PLAN:   1) Chronic systolic HF: NICM s/p ICD Medtronic, EF 30-35% (03/2014).   - Overall  doing well. NYHA II symptoms and volume status stable.  Optivol- volume below threshold. Activity low < 1 hours. No VT. Encouraged more activity - Continue torsemide 40 mg/20 mg with an extra 20 mg if needed. Doing well prn metolazone.  - Continue bisoprolol 5 mg daily. HR too low to titrate  - Continue digoxin every other day. -Continue  12.5 mg spiro daily.  -Increase hydralazine 75  mg twice a day. Continue Imdur 30 mg BID.- Will not add ace due to CKD.  -Check BMET today.  - Has CRT-D upgrade in February - Reinforced the need and importance of daily weights, a low sodium diet, and fluid restriction (less than 2 L a day). Instructed to call the HF clinic if weight increases more than 3 lbs overnight or 5 lbs in a week.  2) CKD stage III -  Followed by nephrology. Get BMET today.  3) HTN: controlled. Continue current medications 4) OSA - Continue to wear CPAP nightly 5) PAF- New to patient 03/2014. Appears to be in NSR and no Afib on optivol. Continue amio 200 daily. Continue Xarelto 15 mg daily d/t renal function. Needs yearly eye exam and he is aware.  6) Obesity:  Has lost about 50 pounds over past year. Now has stabilized. Encouraged to cut back on portions and try to lose more weight.   Follow up in 3 months with an ECHO    Arvilla Meres MD 10/11/2015 3:50 PM

## 2015-10-12 ENCOUNTER — Telehealth: Payer: Self-pay | Admitting: Internal Medicine

## 2015-10-12 NOTE — Telephone Encounter (Signed)
error 

## 2015-10-12 NOTE — Telephone Encounter (Signed)
Called spoke with pt. He scheduled appt to see TP on Tuesday at 9:30. Nothing further needed

## 2015-10-12 NOTE — Telephone Encounter (Signed)
Pt is scheduled to have heart procedure and is needing cpap for this

## 2015-10-14 ENCOUNTER — Emergency Department (HOSPITAL_COMMUNITY): Payer: Medicare Other

## 2015-10-14 ENCOUNTER — Inpatient Hospital Stay (HOSPITAL_COMMUNITY)
Admission: EM | Admit: 2015-10-14 | Discharge: 2015-10-19 | DRG: 291 | Disposition: A | Discharge status: Home / Self Care | Payer: Medicare Other | Attending: Internal Medicine | Admitting: Internal Medicine

## 2015-10-14 ENCOUNTER — Encounter (HOSPITAL_COMMUNITY): Payer: Self-pay | Admitting: Emergency Medicine

## 2015-10-14 DIAGNOSIS — Z8249 Family history of ischemic heart disease and other diseases of the circulatory system: Secondary | ICD-10-CM | POA: Diagnosis not present

## 2015-10-14 DIAGNOSIS — Z794 Long term (current) use of insulin: Secondary | ICD-10-CM

## 2015-10-14 DIAGNOSIS — Z6841 Body Mass Index (BMI) 40.0 and over, adult: Secondary | ICD-10-CM

## 2015-10-14 DIAGNOSIS — E1122 Type 2 diabetes mellitus with diabetic chronic kidney disease: Secondary | ICD-10-CM | POA: Diagnosis present

## 2015-10-14 DIAGNOSIS — G4733 Obstructive sleep apnea (adult) (pediatric): Secondary | ICD-10-CM | POA: Diagnosis present

## 2015-10-14 DIAGNOSIS — Z9889 Other specified postprocedural states: Secondary | ICD-10-CM

## 2015-10-14 DIAGNOSIS — E1165 Type 2 diabetes mellitus with hyperglycemia: Secondary | ICD-10-CM | POA: Diagnosis present

## 2015-10-14 DIAGNOSIS — Z8679 Personal history of other diseases of the circulatory system: Secondary | ICD-10-CM

## 2015-10-14 DIAGNOSIS — I480 Paroxysmal atrial fibrillation: Secondary | ICD-10-CM | POA: Diagnosis present

## 2015-10-14 DIAGNOSIS — Z79899 Other long term (current) drug therapy: Secondary | ICD-10-CM

## 2015-10-14 DIAGNOSIS — Z95810 Presence of automatic (implantable) cardiac defibrillator: Secondary | ICD-10-CM | POA: Diagnosis not present

## 2015-10-14 DIAGNOSIS — I255 Ischemic cardiomyopathy: Secondary | ICD-10-CM | POA: Diagnosis present

## 2015-10-14 DIAGNOSIS — R109 Unspecified abdominal pain: Secondary | ICD-10-CM | POA: Diagnosis present

## 2015-10-14 DIAGNOSIS — Z9119 Patient's noncompliance with other medical treatment and regimen: Secondary | ICD-10-CM | POA: Diagnosis not present

## 2015-10-14 DIAGNOSIS — I340 Nonrheumatic mitral (valve) insufficiency: Secondary | ICD-10-CM | POA: Diagnosis present

## 2015-10-14 DIAGNOSIS — IMO0002 Reserved for concepts with insufficient information to code with codable children: Secondary | ICD-10-CM

## 2015-10-14 DIAGNOSIS — R001 Bradycardia, unspecified: Secondary | ICD-10-CM | POA: Diagnosis present

## 2015-10-14 DIAGNOSIS — K5900 Constipation, unspecified: Secondary | ICD-10-CM | POA: Diagnosis present

## 2015-10-14 DIAGNOSIS — N183 Chronic kidney disease, stage 3 (moderate): Secondary | ICD-10-CM | POA: Diagnosis not present

## 2015-10-14 DIAGNOSIS — I4892 Unspecified atrial flutter: Secondary | ICD-10-CM | POA: Insufficient documentation

## 2015-10-14 DIAGNOSIS — I130 Hypertensive heart and chronic kidney disease with heart failure and stage 1 through stage 4 chronic kidney disease, or unspecified chronic kidney disease: Secondary | ICD-10-CM | POA: Diagnosis present

## 2015-10-14 DIAGNOSIS — I272 Pulmonary hypertension, unspecified: Secondary | ICD-10-CM | POA: Diagnosis present

## 2015-10-14 DIAGNOSIS — Z809 Family history of malignant neoplasm, unspecified: Secondary | ICD-10-CM | POA: Diagnosis not present

## 2015-10-14 DIAGNOSIS — D509 Iron deficiency anemia, unspecified: Secondary | ICD-10-CM | POA: Diagnosis present

## 2015-10-14 DIAGNOSIS — E669 Obesity, unspecified: Secondary | ICD-10-CM | POA: Diagnosis present

## 2015-10-14 DIAGNOSIS — Z833 Family history of diabetes mellitus: Secondary | ICD-10-CM | POA: Diagnosis not present

## 2015-10-14 DIAGNOSIS — N184 Chronic kidney disease, stage 4 (severe): Secondary | ICD-10-CM | POA: Diagnosis present

## 2015-10-14 DIAGNOSIS — E876 Hypokalemia: Secondary | ICD-10-CM | POA: Diagnosis present

## 2015-10-14 DIAGNOSIS — N179 Acute kidney failure, unspecified: Secondary | ICD-10-CM | POA: Diagnosis present

## 2015-10-14 DIAGNOSIS — M109 Gout, unspecified: Secondary | ICD-10-CM | POA: Diagnosis present

## 2015-10-14 DIAGNOSIS — I10 Essential (primary) hypertension: Secondary | ICD-10-CM | POA: Diagnosis not present

## 2015-10-14 DIAGNOSIS — I5043 Acute on chronic combined systolic (congestive) and diastolic (congestive) heart failure: Secondary | ICD-10-CM | POA: Diagnosis not present

## 2015-10-14 DIAGNOSIS — Z7951 Long term (current) use of inhaled steroids: Secondary | ICD-10-CM | POA: Diagnosis not present

## 2015-10-14 DIAGNOSIS — I509 Heart failure, unspecified: Secondary | ICD-10-CM | POA: Diagnosis not present

## 2015-10-14 DIAGNOSIS — M793 Panniculitis, unspecified: Secondary | ICD-10-CM | POA: Diagnosis present

## 2015-10-14 DIAGNOSIS — I5023 Acute on chronic systolic (congestive) heart failure: Secondary | ICD-10-CM | POA: Diagnosis present

## 2015-10-14 DIAGNOSIS — I483 Typical atrial flutter: Secondary | ICD-10-CM | POA: Diagnosis not present

## 2015-10-14 DIAGNOSIS — J45909 Unspecified asthma, uncomplicated: Secondary | ICD-10-CM | POA: Diagnosis present

## 2015-10-14 DIAGNOSIS — L0390 Cellulitis, unspecified: Secondary | ICD-10-CM | POA: Diagnosis present

## 2015-10-14 DIAGNOSIS — I4891 Unspecified atrial fibrillation: Secondary | ICD-10-CM | POA: Diagnosis present

## 2015-10-14 DIAGNOSIS — E785 Hyperlipidemia, unspecified: Secondary | ICD-10-CM | POA: Diagnosis present

## 2015-10-14 DIAGNOSIS — L03319 Cellulitis of trunk, unspecified: Secondary | ICD-10-CM

## 2015-10-14 HISTORY — DX: Disorder of kidney and ureter, unspecified: N28.9

## 2015-10-14 LAB — COMPREHENSIVE METABOLIC PANEL
ALBUMIN: 3.3 g/dL — AB (ref 3.5–5.0)
ALK PHOS: 108 U/L (ref 38–126)
ALT: 31 U/L (ref 17–63)
AST: 34 U/L (ref 15–41)
Anion gap: 15 (ref 5–15)
BILIRUBIN TOTAL: 1.7 mg/dL — AB (ref 0.3–1.2)
BUN: 61 mg/dL — AB (ref 6–20)
CO2: 25 mmol/L (ref 22–32)
Calcium: 9.1 mg/dL (ref 8.9–10.3)
Chloride: 97 mmol/L — ABNORMAL LOW (ref 101–111)
Creatinine, Ser: 2.04 mg/dL — ABNORMAL HIGH (ref 0.61–1.24)
GFR calc Af Amer: 41 mL/min — ABNORMAL LOW (ref >60)
GFR calc non Af Amer: 35 mL/min — ABNORMAL LOW (ref >60)
GLUCOSE: 261 mg/dL — AB (ref 65–99)
Potassium: 3.3 mmol/L — ABNORMAL LOW (ref 3.5–5.1)
Sodium: 137 mmol/L (ref 135–145)
Total Protein: 6.9 g/dL (ref 6.5–8.1)

## 2015-10-14 LAB — URINALYSIS, ROUTINE W REFLEX MICROSCOPIC
BILIRUBIN URINE: NEGATIVE
Glucose, UA: NEGATIVE mg/dL
HGB URINE DIPSTICK: NEGATIVE
Ketones, ur: NEGATIVE mg/dL
Leukocytes, UA: NEGATIVE
NITRITE: NEGATIVE
PH: 6 (ref 5.0–8.0)
Protein, ur: NEGATIVE mg/dL
SPECIFIC GRAVITY, URINE: 1.011 (ref 1.005–1.030)

## 2015-10-14 LAB — CBC
HEMATOCRIT: 37.3 % — AB (ref 39.0–52.0)
HEMOGLOBIN: 11.6 g/dL — AB (ref 13.0–17.0)
MCH: 26.8 pg (ref 26.0–34.0)
MCHC: 31.1 g/dL (ref 30.0–36.0)
MCV: 86.1 fL (ref 78.0–100.0)
Platelets: 152 10*3/uL (ref 150–400)
RBC: 4.33 MIL/uL (ref 4.22–5.81)
RDW: 18.5 % — ABNORMAL HIGH (ref 11.5–15.5)
WBC: 5.8 10*3/uL (ref 4.0–10.5)

## 2015-10-14 LAB — GLUCOSE, CAPILLARY: GLUCOSE-CAPILLARY: 175 mg/dL — AB (ref 65–99)

## 2015-10-14 LAB — LIPASE, BLOOD: Lipase: 35 U/L (ref 11–51)

## 2015-10-14 LAB — BRAIN NATRIURETIC PEPTIDE: B Natriuretic Peptide: 814.8 pg/mL — ABNORMAL HIGH (ref 0.0–100.0)

## 2015-10-14 MED ORDER — CLINDAMYCIN PHOSPHATE 600 MG/50ML IV SOLN
600.0000 mg | Freq: Four times a day (QID) | INTRAVENOUS | Status: DC
  Administered 2015-10-14 – 2015-10-19 (×17): 600 mg via INTRAVENOUS
  Filled 2015-10-14 (×17): qty 50

## 2015-10-14 MED ORDER — HYDRALAZINE HCL 50 MG PO TABS
50.0000 mg | ORAL_TABLET | Freq: Two times a day (BID) | ORAL | Status: DC
  Administered 2015-10-14: 50 mg via ORAL
  Filled 2015-10-14: qty 1

## 2015-10-14 MED ORDER — SODIUM CHLORIDE 0.9 % IV SOLN
250.0000 mL | INTRAVENOUS | Status: DC | PRN
  Administered 2015-10-17: 10:00:00 via INTRAVENOUS

## 2015-10-14 MED ORDER — POTASSIUM CHLORIDE CRYS ER 20 MEQ PO TBCR
20.0000 meq | EXTENDED_RELEASE_TABLET | Freq: Every day | ORAL | Status: DC
  Filled 2015-10-14: qty 1

## 2015-10-14 MED ORDER — CLINDAMYCIN PHOSPHATE 600 MG/50ML IV SOLN: 600.0000 mg | Freq: Once | INTRAVENOUS | Status: DC

## 2015-10-14 MED ORDER — INSULIN ASPART 100 UNIT/ML ~~LOC~~ SOLN
0.0000 [IU] | Freq: Every day | SUBCUTANEOUS | Status: DC
Start: 2015-10-14 — End: 2015-10-19

## 2015-10-14 MED ORDER — MONTELUKAST SODIUM 10 MG PO TABS
10.0000 mg | ORAL_TABLET | Freq: Every day | ORAL | Status: DC
  Administered 2015-10-14 – 2015-10-17 (×3): 10 mg via ORAL
  Filled 2015-10-14 (×5): qty 1

## 2015-10-14 MED ORDER — FLUTICASONE PROPIONATE 50 MCG/ACT NA SUSP
2.0000 | Freq: Every day | NASAL | Status: DC | PRN
  Filled 2015-10-14: qty 16

## 2015-10-14 MED ORDER — ALBUTEROL SULFATE (2.5 MG/3ML) 0.083% IN NEBU: 3.0000 mL | INHALATION_SOLUTION | Freq: Four times a day (QID) | RESPIRATORY_TRACT | Status: DC | PRN

## 2015-10-14 MED ORDER — ONDANSETRON HCL 4 MG/2ML IJ SOLN
4.0000 mg | Freq: Once | INTRAMUSCULAR | Status: AC
  Administered 2015-10-14: 4 mg via INTRAVENOUS
  Filled 2015-10-14: qty 2

## 2015-10-14 MED ORDER — INSULIN DETEMIR 100 UNIT/ML ~~LOC~~ SOLN
28.0000 [IU] | Freq: Every day | SUBCUTANEOUS | Status: DC
  Administered 2015-10-14 – 2015-10-17 (×4): 28 [IU] via SUBCUTANEOUS
  Filled 2015-10-14 (×6): qty 0.28

## 2015-10-14 MED ORDER — SODIUM CHLORIDE 0.9 % IJ SOLN: 3.0000 mL | INTRAMUSCULAR | Status: DC | PRN

## 2015-10-14 MED ORDER — IOHEXOL 300 MG/ML  SOLN
25.0000 mL | INTRAMUSCULAR | Status: AC
  Administered 2015-10-14: 25 mL via ORAL

## 2015-10-14 MED ORDER — ISOSORBIDE MONONITRATE ER 30 MG PO TB24
30.0000 mg | ORAL_TABLET | Freq: Every day | ORAL | Status: DC
  Administered 2015-10-14 – 2015-10-19 (×5): 30 mg via ORAL
  Filled 2015-10-14 (×7): qty 1

## 2015-10-14 MED ORDER — BISOPROLOL FUMARATE 5 MG PO TABS
5.0000 mg | ORAL_TABLET | Freq: Every day | ORAL | Status: DC
  Administered 2015-10-16 – 2015-10-19 (×4): 5 mg via ORAL
  Filled 2015-10-14 (×5): qty 1

## 2015-10-14 MED ORDER — POTASSIUM CHLORIDE CRYS ER 20 MEQ PO TBCR
20.0000 meq | EXTENDED_RELEASE_TABLET | Freq: Every day | ORAL | Status: DC
  Administered 2015-10-14: 20 meq via ORAL

## 2015-10-14 MED ORDER — SODIUM CHLORIDE 0.9 % IJ SOLN
3.0000 mL | Freq: Two times a day (BID) | INTRAMUSCULAR | Status: DC
  Administered 2015-10-14 – 2015-10-18 (×4): 3 mL via INTRAVENOUS

## 2015-10-14 MED ORDER — RIVAROXABAN 15 MG PO TABS
15.0000 mg | ORAL_TABLET | Freq: Every day | ORAL | Status: DC
  Administered 2015-10-14 – 2015-10-15 (×2): 15 mg via ORAL
  Filled 2015-10-14 (×2): qty 1

## 2015-10-14 MED ORDER — INSULIN ASPART 100 UNIT/ML ~~LOC~~ SOLN
0.0000 [IU] | Freq: Three times a day (TID) | SUBCUTANEOUS | Status: DC
  Administered 2015-10-15 – 2015-10-16 (×4): 4 [IU] via SUBCUTANEOUS
  Administered 2015-10-17: 3 [IU] via SUBCUTANEOUS
  Administered 2015-10-17 – 2015-10-19 (×5): 4 [IU] via SUBCUTANEOUS

## 2015-10-14 MED ORDER — DIGOXIN 125 MCG PO TABS: 0.2500 mg | ORAL_TABLET | Freq: Every day | ORAL | Status: DC

## 2015-10-14 MED ORDER — AMIODARONE HCL 100 MG PO TABS: 100.0000 mg | ORAL_TABLET | Freq: Every day | ORAL | Status: DC

## 2015-10-14 MED ORDER — DIGOXIN 125 MCG PO TABS
0.1250 mg | ORAL_TABLET | Freq: Every day | ORAL | Status: DC
  Administered 2015-10-16 – 2015-10-19 (×4): 0.125 mg via ORAL
  Filled 2015-10-14 (×5): qty 1

## 2015-10-14 MED ORDER — FUROSEMIDE 10 MG/ML IJ SOLN
120.0000 mg | Freq: Two times a day (BID) | INTRAMUSCULAR | Status: DC
  Administered 2015-10-15: 120 mg via INTRAVENOUS
  Filled 2015-10-14 (×2): qty 12

## 2015-10-14 MED ORDER — CLINDAMYCIN HCL 150 MG PO CAPS: 450.0000 mg | ORAL_CAPSULE | Freq: Four times a day (QID) | ORAL | Status: DC

## 2015-10-14 MED ORDER — ONDANSETRON HCL 4 MG/2ML IJ SOLN
4.0000 mg | Freq: Four times a day (QID) | INTRAMUSCULAR | Status: DC | PRN
  Administered 2015-10-15 – 2015-10-18 (×2): 4 mg via INTRAVENOUS
  Filled 2015-10-14 (×2): qty 2

## 2015-10-14 MED ORDER — ATORVASTATIN CALCIUM 40 MG PO TABS
40.0000 mg | ORAL_TABLET | Freq: Every day | ORAL | Status: DC
  Administered 2015-10-14 – 2015-10-19 (×6): 40 mg via ORAL
  Filled 2015-10-14 (×6): qty 1

## 2015-10-14 MED ORDER — ACETAMINOPHEN 325 MG PO TABS: 650.0000 mg | ORAL_TABLET | ORAL | Status: DC | PRN

## 2015-10-14 MED ORDER — FUROSEMIDE 10 MG/ML IJ SOLN: 80.0000 mg | Freq: Two times a day (BID) | INTRAMUSCULAR | Status: DC

## 2015-10-14 MED ORDER — CLINDAMYCIN HCL 150 MG PO CAPS
450.0000 mg | ORAL_CAPSULE | Freq: Once | ORAL | Status: AC
  Administered 2015-10-14: 450 mg via ORAL
  Filled 2015-10-14: qty 3

## 2015-10-14 MED ORDER — LINAGLIPTIN 5 MG PO TABS
5.0000 mg | ORAL_TABLET | Freq: Every day | ORAL | Status: DC
  Administered 2015-10-16 – 2015-10-19 (×4): 5 mg via ORAL
  Filled 2015-10-14 (×4): qty 1

## 2015-10-14 MED ORDER — AMIODARONE HCL 200 MG PO TABS
400.0000 mg | ORAL_TABLET | Freq: Every day | ORAL | Status: DC
  Administered 2015-10-16 – 2015-10-19 (×4): 400 mg via ORAL
  Filled 2015-10-14 (×5): qty 2

## 2015-10-14 NOTE — ED Notes (Signed)
Patient transported to CT 

## 2015-10-14 NOTE — Consult Note (Signed)
Primary cardiologist: Dr Haroldine Laws  HPI: 55 year old male with a history of chronic systolic heart failure, NICM, HTN, DM II, OSA on CPAP, history of ventricular tachycardia status post AICD placement in 2009, atrial fibrillation and morbid obesity with acute on chronic systolic CHF. Last echocardiogram June 2015 showed ejection fraction 44-31%, grade 2 diastolic dysfunction, mild mitral regurgitation and moderate to severe left atrial enlargement. RHC (05/2014): Left sided filling pressures well compensated, mild pulm HTN with elevated right-sided pressures and mod/severely depressed CO.  Saw Dr. Rayann Heman 12/16 and device is at [facility]. Planning upgrade to CRT-D in February.  Seen in CHF clinic January 5. Optivol - no VT or afib; fluid level below threshold. Patient states that since that time he has not felt well. He complains of mild increased dyspnea on exertion. He has not been weighing daily. He complains of increasing abdominal fluid and lower extremity edema. No chest pain, palpitations, fevers, chills or productive cough. It is not clear that he has been completely compliant with medications. States he may be consuming extra sodium over the holidays.    (Not in a hospital admission)  No Known Allergies  Past Medical History  Diagnosis Date  . Nonischemic cardiomyopathy ([place])     a. LHC (02/2011) normal coronaries  . Ventricular tachycardia [facility])     s/p MDT ICD implant  . Alcohol abuse     now quit  . Pulmonary hypertension ([place])   . Diverticulosis of colon   . Hemorrhoids, internal   . Cholecystitis   . Morbid obesity ([place])   . Obstructive sleep apnea   . Anemia     iron defi  . Hypertension   . Gout   . Diabetes mellitus   . Chronic systolic congestive heart failure ([place])     a. RHC (02/2011) RA 31/27, RV 71/27, PA67/45, PCWP 46, PA 49%, Fick CO: 3.4 b. ECHO (03/2014) EF 30-35%, grade II DD, mild MR, RV poorly visualized appears mildly decreased c. RHC (05/2014) RA 13, RV  54/4/11, PA 60/22 (37), PCWP 17, Fick CO/CI: 4.4 / 1.9, Thermo CO/CI: 3.8 /1.6, PVR 5.2 WU, PA 57% and 59%  . Asthma   . Paroxysmal atrial fibrillation ([facility])   . Renal insufficiency     Past Surgical History  Procedure Laterality Date  . Icd implantation      ICD- [employer] 9/09  . Cardiac catheterization  03/08/2004    EF 25-30%  . US echocardiography  08/22/2008    EF 30-35%  . Transthoracic echocardiogram  05/27/2008    EF 30-35%  . Right heart catheterization N/A 05/23/2014    Procedure: RIGHT HEART CATH;  Surgeon: Jolaine Artist, MD;  Location: [facility] CATH LAB;  Service: Cardiovascular;  Laterality: N/A;    Social History   Social History  . Marital Status: Single    Spouse Name: N/A  . Number of Children: 4  . Years of Education: N/A   Occupational History  .     Social History Main Topics  . Smoking status: Never Smoker   . Smokeless tobacco: Not on file  . Alcohol Use: No     Comment: former heavy ETOH, quit  . Drug Use: No  . Sexual Activity: Not on file   Other Topics Concern  . Not on file   Social History Narrative   disabled    Family History  Problem Relation Age of Onset  . Cancer Father   . Diabetes      grandparents  .  Hypertension Mother   . CAD      No family history    ROS:  no fevers or chills, productive cough, hemoptysis, dysphasia, odynophagia, melena, hematochezia, dysuria, hematuria, rash, seizure activity, orthopnea, PND, claudication. Remaining systems are negative.  Physical Exam:   Blood pressure 114/83, pulse 50, temperature 98.4 F (36.9 C), temperature source Oral, resp. rate 18, height 5' 5" (1.651 m), weight 313 lb 12 oz (142.316 kg), SpO2 92 %.  General:  Well developed/morbidly obese in NAD Skin warm/dry Patient not depressed No peripheral clubbing Back-normal HEENT-normal/normal eyelids Neck supple/normal carotid upstroke bilaterally; no bruits; positive JVD; no thyromegaly chest - CTA/ normal expansion CV -  RRR/normal S1 and S2; no rubs or gallops;  PMI nondisplaced; 3/6 systolic murmur apex Abdomen -Obese, positive ascites. There is note of abdominal wall edema. Mild tenderness. No masses palpated. 2+ femoral pulses, no bruits Ext-1+ edema, no chords, 2+ DP Neuro-grossly nonfocal  ECG Atrial flutter with left bundle branch block.  Results for orders placed or performed during the hospital encounter of 10/14/15 (from the past 48 hour(s))  Lipase, blood     Status: None   Collection Time: 10/14/15  9:00 AM  Result Value Ref Range   Lipase 35 11 - 51 U/L  Comprehensive metabolic panel     Status: Abnormal   Collection Time: 10/14/15  9:00 AM  Result Value Ref Range   Sodium 137 135 - 145 mmol/L   Potassium 3.3 (L) 3.5 - 5.1 mmol/L   Chloride 97 (L) 101 - 111 mmol/L   CO2 25 22 - 32 mmol/L   Glucose, Bld 261 (H) 65 - 99 mg/dL   BUN 61 (H) 6 - 20 mg/dL   Creatinine, Ser 2.04 (H) 0.61 - 1.24 mg/dL   Calcium 9.1 8.9 - 10.3 mg/dL   Total Protein 6.9 6.5 - 8.1 g/dL   Albumin 3.3 (L) 3.5 - 5.0 g/dL   AST 34 15 - 41 U/L   ALT 31 17 - 63 U/L   Alkaline Phosphatase 108 38 - 126 U/L   Total Bilirubin 1.7 (H) 0.3 - 1.2 mg/dL   GFR calc non Af Amer 35 (L) >60 mL/min   GFR calc Af Amer 41 (L) >60 mL/min    Comment: (NOTE) The eGFR has been calculated using the CKD EPI equation. This calculation has not been validated in all clinical situations. eGFR's persistently <60 mL/min signify possible Chronic Kidney Disease.    Anion gap 15 5 - 15  CBC     Status: Abnormal   Collection Time: 10/14/15  9:00 AM  Result Value Ref Range   WBC 5.8 4.0 - 10.5 K/uL   RBC 4.33 4.22 - 5.81 MIL/uL   Hemoglobin 11.6 (L) 13.0 - 17.0 g/dL   HCT 37.3 (L) 39.0 - 52.0 %   MCV 86.1 78.0 - 100.0 fL   MCH 26.8 26.0 - 34.0 pg   MCHC 31.1 30.0 - 36.0 g/dL   RDW 18.5 (H) 11.5 - 15.5 %   Platelets 152 150 - 400 K/uL  Brain natriuretic peptide     Status: Abnormal   Collection Time: 10/14/15  9:00 AM  Result Value  Ref Range   B Natriuretic Peptide 814.8 (H) 0.0 - 100.0 pg/mL  Urinalysis, Routine w reflex microscopic (not at [facility])     Status: None   Collection Time: 10/14/15 10:30 AM  Result Value Ref Range   Color, Urine YELLOW YELLOW   APPearance CLEAR CLEAR   Specific  Gravity, Urine 1.011 1.005 - 1.030   pH 6.0 5.0 - 8.0   Glucose, UA NEGATIVE NEGATIVE mg/dL   Hgb urine dipstick NEGATIVE NEGATIVE   Bilirubin Urine NEGATIVE NEGATIVE   Ketones, ur NEGATIVE NEGATIVE mg/dL   Protein, ur NEGATIVE NEGATIVE mg/dL   Nitrite NEGATIVE NEGATIVE   Leukocytes, UA NEGATIVE NEGATIVE    Comment: MICROSCOPIC NOT DONE ON URINES WITH NEGATIVE PROTEIN, BLOOD, LEUKOCYTES, NITRITE, OR GLUCOSE <1000 mg/dL.    Ct Abdomen Pelvis Wo Contrast  10/14/2015  CLINICAL DATA:  Patient with generalized weakness and abdominal distension. EXAM: CT ABDOMEN AND PELVIS WITHOUT CONTRAST TECHNIQUE: Multidetector CT imaging of the abdomen and pelvis was performed following the standard protocol without IV contrast. COMPARISON:  Ultrasound abdomen 07/11/2011 FINDINGS: Lower chest: Cardiomegaly. Dependent atelectasis within the lung bases bilaterally. No pleural effusion. Hepatobiliary: Liver is diffusely low in attenuation compatible with steatosis. Layering high attenuation material within the gallbladder lumen. No gallbladder wall thickening. No intrahepatic or extrahepatic biliary ductal dilatation. Pancreas: Unremarkable Spleen: Unremarkable Adrenals/Urinary Tract: Adrenal glands are normal. Kidneys are symmetric in size. No hydronephrosis. Urinary bladder is unremarkable. Stomach/Bowel: Sigmoid colonic diverticulosis. No CT evidence for acute diverticulitis. The appendix is normal. No evidence for bowel obstruction. No free fluid or free intraperitoneal air. Normal morphology to the stomach. Vascular/Lymphatic: Normal caliber abdominal aorta. No retroperitoneal lymphadenopathy. Other: Small fat containing bilateral inguinal hernias. Prostate  unremarkable. Musculoskeletal: Lumbar spine degenerative changes. No aggressive or acute appearing osseous lesion. Small amount of fluid within umbilicus. Subcutaneous fat stranding involving the pannus. IMPRESSION: Subcutaneous fat stranding involving the pannus. Recommend clinical correlation to exclude the possibility of cellulitis. No acute intra-abdominal process identified. Hepatic steatosis. Cardiomegaly. Layering high attenuation in the gallbladder lumen suggestive of sludge. Electronically Signed   By: Lovey Newcomer M.D.   On: 10/14/2015 14:37   Dg Chest 2 View  10/14/2015  CLINICAL DATA:  55 year old male with weakness, abdominal pain and nausea EXAM: CHEST  2 VIEW COMPARISON:  Prior chest x-ray 03/07/2014 FINDINGS: Stable position of left subclavian approach intracardiac defibrillator. There is marked enlargement of the cardiopericardial silhouette which is similar compared to prior. Background pulmonary vascular congestion without definitive edema. Limited lateral chest x-ray secondary to patient body habitus. No pleural effusion, pneumothorax or definite focal airspace consolidation. IMPRESSION: 1. Stable marked enlargement of the cardiopericardial silhouette compared to prior imaging which may reflect cardiomegaly and/or pericardial effusion. 2. Stable position of intracardiac defibrillator. 3. Pulmonary vascular congestion without overt edema. Electronically Signed   By: Jacqulynn Cadet M.D.   On: 10/14/2015 09:59    Assessment/Plan 1 acute on chronic systolic congestive heart failure-patient presents with progressive symptoms of congestive heart failure. He is noted to be in atrial flutter which was also present on electrocardiogram on December 29. I wonder if this may be contributing to his symptoms. The patient states he has been compliant with his xarelto without missing any doses. We will keep npo after midnight and proceed with cardioversion in AM. Hopefully this will improve cardiac  output. We'll treat with Lasix 120 mg IV twice a day. Follow renal function closely. May need milrinone if CHF does not improve with reestablishing sinus rhythm. Plan repeat echocardiogram. Note he has a significant mitral regurgitation murmur on examination. His left ventricle may have further dilated causing mitral regurgitation which could be contributing to his congestive heart failure as well. 2 atrial flutter-Potentially contributing to congestive heart failure. Plan cardioversion as outlined above. Chadsvasc 3. Continue xarelto. Watch renal function; may  need to change to apixaban if GFR decreases to < 30. Increase amiodarone to 400 mg daily. Continue bisoprolol to help with rate control. 3 chronic stage IV renal insufficiency-Renal function has worsened possibly secondary to poor perfusion/low cardiac output. Hopefully reestablishing sinus rhythm will help. Follow closely with diuresis. He will need follow-up with nephrology as well. 4 nonischemic cardiomyopathy-continue hydralazine/nitrates. No ACE inhibitor given severity of renal insufficiency. Continue low-dose beta blocker to assist with rate control of atrial flutter. 5 noncompliance-this appears to be contributing to CHF as he states he is not always taking his medications and is not always compliant with diet. 6 morbid obesity-needs weight loss. 7 hypertension-follow blood pressure and increase hydralazine/nitrates as tolerated. 8 Prior ICD-scheduled for upgrade in February.   Kirk Ruths MD 10/14/2015, 5:03 PM

## 2015-10-14 NOTE — H&P (Signed)
[facility] History and Physical                                                                                    Gary Hutchinson, is a 55 y.o. male  MRN: [id number]   DOB - [birth year]  Admit Date - 10/14/2015  Outpatient Primary MD for the patient is [name], MD  Referring MD: Rosalia Hammers / ER  Consulting M.D: Jens Som / Cardiology  PMH: Past Medical History  Diagnosis Date  . Nonischemic cardiomyopathy (HCC)     a. LHC (02/2011) normal coronaries  . Ventricular tachycardia [facility])     s/p MDT ICD implant  . Alcohol abuse     now quit  . Pulmonary hypertension (HCC)   . Diverticulosis of colon   . Hemorrhoids, internal   . Cholecystitis   . Morbid obesity (HCC)   . Obstructive sleep apnea   . Anemia     iron defi  . Hypertension   . Gout   . Diabetes mellitus   . Chronic systolic congestive heart failure (HCC)     a. RHC (02/2011) RA 31/27, RV 71/27, PA67/45, PCWP 46, PA 49%, Fick CO: 3.4 b. ECHO (03/2014) EF 30-35%, grade II DD, mild MR, RV poorly visualized appears mildly decreased c. RHC (05/2014) RA 13, RV 54/4/11, PA 60/22 (37), PCWP 17, Fick CO/CI: 4.4 / 1.9, Thermo CO/CI: 3.8 /1.6, PVR 5.2 WU, PA 57% and 59%  . Asthma   . Paroxysmal atrial fibrillation (HCC)   . Renal insufficiency       PSH: Past Surgical History  Procedure Laterality Date  . Icd implantation      ICD- Med[employer] 9/09  . Cardiac catheterization  03/08/2004    EF 25-30%  . US echocardiography  08/22/2008    EF 30-35%  . Transthoracic echocardiogram  05/27/2008    EF 30-35%  . Right heart catheterization N/A 05/23/2014    Procedure: RIGHT HEART CATH;  Surgeon: [name], MD;  Location: [facility] CATH LAB;  Service: Cardiovascular;  Laterality: N/A;     CC:  Chief Complaint  Patient presents with  . Weakness  . Bloated     HPI: This is a 55 year old male patient with known chronic combined systolic and diastolic heart failure, morbid obesity, ischemic cardiomyopathy, ICD placement  leading of history of ventricular tachycardia, diabetes on insulin, hypertension, iron deficiency anemia, atrial fibrillation on amiodarone and anticoagulation, gout, sleep apnea and pulmonary hypertension. Patient presented to the ER today with complaints of generalized weakness with abdominal bloating nonproductive cough, nausea and increasing lower extremity edema. He also reports weight of at least 4 pounds. He's also had increasing shortness of breath as well as old this has been going on for at least 2 days. He was last evaluated at the CHF clinic on 1/5 and at that time he states they told him he looked like he was getting a little bit volume overloaded. Patient has Demadex to take for weight gain. He took the extra Demadex but did not have any significant improvement in urinary output or his symptoms. Because of this he presented to the ER for further evaluation and  treatment. In addition he is also reporting decreased appetite and constipation.  ER Evaluation and treatment: Afebrile, vital signs stable with stable chronic bradycardia, room air saturations 93-95% Weight 313 pounds Two-view chest x-ray: Pulmonary vascular congestion without overt edema CT abdomen and pelvis out contrast: Subcutaneous fat stranding involving the pannus. EKG: Atrial fibrillation with intraventricular conduction did lay and suspected incomplete right bundle branch block, prolonged QTC and setting of intraventricular conduction delay 546 ms Potassium 3.3 BUN 61 and creatinine 2.04-baseline renal function BUN 39 and creatinine 1.7 Glucose 261 BNP 815 Digoxin 0.6 Urinalysis unremarkable  Review of Systems   In addition to the HPI above,  No Fever-chills, myalgias or other constitutional symptoms No Headache, changes with Vision or hearing, new weakness, tingling, numbness in any extremity, No problems swallowing food or Liquids, indigestion/reflux No Chest pain, palpitations No N/V; no melena or hematochezia,  no dark tarry stools No dysuria, hematuria or flank pain No new skin rashes, lesions, masses or bruises, No new joints pains-aches No polyuria, polydypsia or polyphagia,  *A full 10 point Review of Systems was done, except as stated above, all other Review of Systems were negative.  Social History Social History  Substance Use Topics  . Smoking status: Never Smoker   . Smokeless tobacco: Not on file  . Alcohol Use: No     Comment: former heavy ETOH, quit    Resides at: Private residence  Lives with: Alone  Ambulatory status: Without assistive devices   Family History Family History  Problem Relation Age of Onset  . Cancer Father   . Diabetes      grandparents  . Hypertension Mother   . CAD      No family history     Prior to Admission medications   Medication Sig Start Date End Date Taking? Authorizing Provider  albuterol (PROAIR HFA) 108 (90 BASE) MCG/ACT inhaler Inhale 2 puffs into the lungs every 6 (six) hours as needed. For wheezing or shortness of breath   Yes Historical Provider, MD  allopurinol (ZYLOPRIM) 100 MG tablet Take 3 tablets by mouth daily.  07/12/12  Yes Historical Provider, MD  amiodarone (PACERONE) 100 MG tablet Take 1 tablet (100 mg total) by mouth daily. 05/02/15  Yes [name], MD  atorvastatin (LIPITOR) 40 MG tablet Take 1 tablet (40 mg total) by mouth daily. 07/14/14  Yes Bevelyn Buckles [name], MD  bisoprolol (ZEBETA) 10 MG tablet TAKE 1/2 TABLETS (5 MG TOTAL) BY MOUTH DAILY. 06/13/15  Yes [name], MD  colchicine 0.6 MG tablet Take 0.6 mg by mouth daily as needed. For gout   Yes Historical Provider, MD  digoxin (LANOXIN) 0.25 MG tablet TAKE 0.5 TABLETS (125 MCG TOTAL) BY MOUTH EVERY OTHER DAY. 09/12/15  Yes [name], MD  fluticasone (FLONASE) 50 MCG/ACT nasal spray Place 2 sprays into the nose daily as needed for allergies.  08/04/12  Yes Historical Provider, MD  Fluticasone-Salmeterol (ADVAIR DISKUS) 250-50 MCG/DOSE AEPB  Inhale 1 puff into the lungs every 12 (twelve) hours as needed. For wheezing or shortness of breath   Yes Historical Provider, MD  hydrALAZINE (APRESOLINE) 50 MG tablet Take 50 mg by mouth 2 (two) times daily.   Yes Historical Provider, MD  insulin detemir (LEVEMIR) 100 UNIT/ML injection Inject 28 Units into the skin at bedtime. 03/13/14  Yes Aundria Rud, NP  isosorbide mononitrate (IMDUR) 30 MG 24 hr tablet TAKE 1 TABLET (30 MG TOTAL) BY MOUTH 2 (TWO) TIMES DAILY. 11/24/14  Yes [name], MD  linagliptin (TRADJENTA) 5 MG TABS tablet Take 1 tablet (5 mg total) by mouth daily. 03/13/14  Yes Aundria Rud, NP  metolazone (ZAROXOLYN) 2.5 MG tablet Take 2.5 mg by mouth daily as needed (edema).   Yes Historical Provider, MD  montelukast (SINGULAIR) 10 MG tablet Take 10 mg by mouth daily as needed. For shortness of breath or wheezing   Yes Historical Provider, MD  potassium chloride SA (K-DUR,KLOR-CON) 20 MEQ tablet Take 20 mEq by mouth daily.   Yes Historical Provider, MD  rivaroxaban (XARELTO) 15 MG TABS tablet Take 1 tablet (15 mg total) by mouth daily. 05/18/14  Yes [name], MD  spironolactone (ALDACTONE) 25 MG tablet Take 0.5 tablets (12.5 mg total) by mouth daily. 03/13/14  Yes Aundria Rud, NP  torsemide (DEMADEX) 20 MG tablet TAKE 40 MG (2 TABLETS) EVERY MORNING AND 20 MG (1 TABLET) EVERY EVENING 07/09/15  Yes [name], MD  acetaminophen (TYLENOL) 325 MG tablet Take 650 mg by mouth every 6 (six) hours as needed (pain). Reported on 10/11/2015    Historical Provider, MD  clindamycin (CLEOCIN) 150 MG capsule Take 3 capsules (450 mg total) by mouth 4 (four) times daily. 10/14/15   Eyvonne Mechanic, [name]    No Known Allergies  Physical Exam  Vitals  Blood pressure 114/83, pulse 50, temperature 98.4 F (36.9 C), temperature source Oral, resp. rate 18, height  (1.651 m), weight 313 lb 12 oz (142.316 kg), SpO2 92 %.   General:  In no acute distress, appears acutely ill  but nontoxic  Psych:  Normal somewhat flat affect, Denies Suicidal or Homicidal ideations, Awake Alert, Oriented X 3. Speech and thought patterns are clear and appropriate, no apparent short term memory deficits  Neuro:   No focal neurological deficits, CN II through XII intact, Strength 5/5 all 4 extremities, Sensation intact all 4 extremities.  ENT:  Ears and Eyes appear Normal, Conjunctivae clear, PER. Moist oral mucosa without erythema or exudates.  Neck:  Supple, No lymphadenopathy appreciated  Respiratory:  Symmetrical chest wall movement, Good air movement bilaterally, lateral crackles mid fields down. Room Air  Cardiac:  RRR, No Murmurs, trace 1+ bilateral LE edema noted, no JVD, No carotid bruits, peripheral pulses palpable at 2+  Abdomen:  Positive bowel sounds, Soft, tender over right lower abdominal area, distended,  No masses appreciated, no obvious hepatosplenomegaly  Skin:  No Cyanosis, Normal Skin Turgor, No Bruise. Mild erythematous changes in the right lower side of the abdomen that is warm to touch  Extremities: Symmetrical without obvious trauma or injury,  no effusions.  Data Review  CBC  Recent Labs Lab 10/14/15 0900  WBC 5.8  HGB 11.6*  HCT 37.3*  PLT 152  MCV 86.1  MCH 26.8  MCHC 31.1  RDW 18.5*    Chemistries   Recent Labs Lab 10/11/15 1617 10/14/15 0900  NA 139 137  K 3.4* 3.3*  CL 99* 97*  CO2 27 25  GLUCOSE 159* 261*  BUN 55* 61*  CREATININE 2.10* 2.04*  CALCIUM 9.7 9.1  AST  --  34  ALT  --  31  ALKPHOS  --  108  BILITOT  --  1.7*    estimated creatinine clearance is 54.9 mL/min (by C-G formula based on Cr of 2.04).  No results for input(s): TSH, T4TOTAL, T3FREE, THYROIDAB in the last 72 hours.  Invalid input(s): FREET3  Coagulation profile No results for input(s): INR, PROTIME  in the last 168 hours.  No results for input(s): DDIMER in the last 72 hours.  Cardiac Enzymes No results for input(s): CKMB, TROPONINI,  MYOGLOBIN in the last 168 hours.  Invalid input(s): CK  Invalid input(s): POCBNP  Urinalysis    Component Value Date/Time   COLORURINE YELLOW 10/14/2015 1030   APPEARANCEUR CLEAR 10/14/2015 1030   LABSPEC 1.011 10/14/2015 1030   PHURINE 6.0 10/14/2015 1030   GLUCOSEU NEGATIVE 10/14/2015 1030   HGBUR NEGATIVE 10/14/2015 1030   BILIRUBINUR NEGATIVE 10/14/2015 1030   KETONESUR NEGATIVE 10/14/2015 1030   PROTEINUR NEGATIVE 10/14/2015 1030   UROBILINOGEN 1.0 04/06/2011 2108   NITRITE NEGATIVE 10/14/2015 1030   LEUKOCYTESUR NEGATIVE 10/14/2015 1030    Imaging results:   Ct Abdomen Pelvis Wo Contrast  10/14/2015  CLINICAL DATA:  Patient with generalized weakness and abdominal distension. EXAM: CT ABDOMEN AND PELVIS WITHOUT CONTRAST TECHNIQUE: Multidetector CT imaging of the abdomen and pelvis was performed following the standard protocol without IV contrast. COMPARISON:  Ultrasound abdomen 07/11/2011 FINDINGS: Lower chest: Cardiomegaly. Dependent atelectasis within the lung bases bilaterally. No pleural effusion. Hepatobiliary: Liver is diffusely low in attenuation compatible with steatosis. Layering high attenuation material within the gallbladder lumen. No gallbladder wall thickening. No intrahepatic or extrahepatic biliary ductal dilatation. Pancreas: Unremarkable Spleen: Unremarkable Adrenals/Urinary Tract: Adrenal glands are normal. Kidneys are symmetric in size. No hydronephrosis. Urinary bladder is unremarkable. Stomach/Bowel: Sigmoid colonic diverticulosis. No CT evidence for acute diverticulitis. The appendix is normal. No evidence for bowel obstruction. No free fluid or free intraperitoneal air. Normal morphology to the stomach. Vascular/Lymphatic: Normal caliber abdominal aorta. No retroperitoneal lymphadenopathy. Other: Small fat containing bilateral inguinal hernias. Prostate unremarkable. Musculoskeletal: Lumbar spine degenerative changes. No aggressive or acute appearing osseous  lesion. Small amount of fluid within umbilicus. Subcutaneous fat stranding involving the pannus. IMPRESSION: Subcutaneous fat stranding involving the pannus. Recommend clinical correlation to exclude the possibility of cellulitis. No acute intra-abdominal process identified. Hepatic steatosis. Cardiomegaly. Layering high attenuation in the gallbladder lumen suggestive of sludge. Electronically Signed   By: Annia Belt M.D.   On: 10/14/2015 14:37   Dg Chest 2 View  10/14/2015  CLINICAL DATA:  55 year old male with weakness, abdominal pain and nausea EXAM: CHEST  2 VIEW COMPARISON:  Prior chest x-ray 03/07/2014 FINDINGS: Stable position of left subclavian approach intracardiac defibrillator. There is marked enlargement of the cardiopericardial silhouette which is similar compared to prior. Background pulmonary vascular congestion without definitive edema. Limited lateral chest x-ray secondary to patient body habitus. No pleural effusion, pneumothorax or definite focal airspace consolidation. IMPRESSION: 1. Stable marked enlargement of the cardiopericardial silhouette compared to prior imaging which may reflect cardiomegaly and/or pericardial effusion. 2. Stable position of intracardiac defibrillator. 3. Pulmonary vascular congestion without overt edema. Electronically Signed   By: Malachy Moan M.D.   On: 10/14/2015 09:59     EKG: (Independently reviewed)   Assessment & Plan  Principal Problem:   Acute on chronic combined systolic and diastolic congestive heart failure, NYHA class 2 /pulmonary hypertension -Admit to stepdown/inpatient-cardiology states that if CHF does not improve after cardioversion (i.e. establish sinus rhythm) the may require milrinone -Not hypoxemic at rest -Begin Lasix IV-cardiology has ordered 120 mg twice a day -Old preadmission Aldactone -Continues Zebeta, digitoxin (dose decreased by cardiology), and Imdur -Daily weights and strict I and O  Active Problems:   Acute  renal failure superimposed on stage 3 chronic kidney disease  -Exacerbated secondary to acute heart failure -No ACE inhibitor or ARB  Acute panniculitis -IV clindamycin -No leukocytosis and no features consistent with sepsis    Insulin dependent type 2 diabetes mellitus, uncontrolled  -Resume preadmission Tradjenta and Levemir -SSI -Hemoglobin A1c    Obstructive sleep apnea -Nocturnal BiPAP -Patient having issues with obtaining supplies due to need for signature from pulmonologist-last case manager to investigate to see if can be accomplished during hospitalization    HTN (hypertension) -See above regarding heart failure treatment    Morbid obesity (HCC) -Patient has lost 50 pounds of body weight in the past 1 year    ANEMIA-IRON DEFICIENCY -Hgb stable and at baseline    Atrial fibrillation /QTC prolongation -Continue amiodarone-urology as adjusted dosing this admission -Continue Xarelto -Has chronic QT prolongation -Reality has made nothing by mouth after midnight for planned cardioversion in hopes this will improve patient's heart failure symptomatology    DVT Prophylaxis: Xarelto  Family Communication:  No family at bedside   Code Status:  Full code  Condition:  Stable  Discharge disposition: Anticipate discharge back to previous home environment once medically stable  Time spent in minutes : 60      Kole Hilyard L. ANP on 10/14/2015 at 5:20 PM  You may contact me by going to [email or web address] - password [email or web address]  I am available from 7a-7p but please confirm I am on the schedule by going to Amion as above.   After 7p please contact night coverage person covering me after hours  [facility]

## 2015-10-14 NOTE — ED Provider Notes (Signed)
CSN: [id number]     Arrival date & time 10/14/15  4098 History   First MD Initiated Contact with Patient 10/14/15 [phone]     Chief Complaint  Patient presents with  . Weakness  . Bloated   HPI   55 year old male presents today with generalized weakness. Patient ports that over the last week she's had generalized weakness, with nonproductive cough, no other associated upper respiratory complaints. Patient notes that over the last 2 days he's had nausea, with no vomiting, with associated abdominal swelling and generalized abdominal pain. Pt reports last episode of this was an exacerbation of his HF.  Patient reports decreased appetite, but is still tolerating by mouth. He denies any chest pain, fever chills, changes in bowel or bladder characteristics or frequency.  Pt reports SOB over the last two days.  .   Past Medical History  Diagnosis Date  . Nonischemic cardiomyopathy (HCC)     a. LHC (02/2011) normal coronaries  . Ventricular tachycardia [facility])     s/p MDT ICD implant  . Alcohol abuse     now quit  . Pulmonary hypertension (HCC)   . Diverticulosis of colon   . Hemorrhoids, internal   . Cholecystitis   . Morbid obesity (HCC)   . Obstructive sleep apnea   . Anemia     iron defi  . Hypertension   . Gout   . Diabetes mellitus   . Chronic systolic congestive heart failure (HCC)     a. RHC (02/2011) RA 31/27, RV 71/27, PA67/45, PCWP 46, PA 49%, Fick CO: 3.4 b. ECHO (03/2014) EF 30-35%, grade II DD, mild MR, RV poorly visualized appears mildly decreased c. RHC (05/2014) RA 13, RV 54/4/11, PA 60/22 (37), PCWP 17, Fick CO/CI: 4.4 / 1.9, Thermo CO/CI: 3.8 /1.6, PVR 5.2 WU, PA 57% and 59%  . Asthma   . Paroxysmal atrial fibrillation [facility])    Past Surgical History  Procedure Laterality Date  . Icd implantation      ICD- [employer] 9/09  . Cardiac catheterization  03/08/2004    EF 25-30%  . US echocardiography  08/22/2008    EF 30-35%  . Transthoracic echocardiogram  05/27/2008    EF 30-35%   . Right heart catheterization N/A 05/23/2014    Procedure: RIGHT HEART CATH;  Surgeon: [name], MD;  Location: [facility] CATH LAB;  Service: Cardiovascular;  Laterality: N/A;   Family History  Problem Relation Age of Onset  . Cancer Father   . Diabetes      grandparents  . Hypertension Mother    Social History  Substance Use Topics  . Smoking status: Never Smoker   . Smokeless tobacco: None  . Alcohol Use: No     Comment: former heavy ETOH, quit    Review of Systems  All other systems reviewed and are negative.   Allergies  Review of patient's allergies indicates no known allergies.  Home Medications   Prior to Admission medications   Medication Sig Start Date End Date Taking? Authorizing Provider  albuterol (PROAIR HFA) 108 (90 BASE) MCG/ACT inhaler Inhale 2 puffs into the lungs every 6 (six) hours as needed. For wheezing or shortness of breath   Yes Historical Provider, MD  allopurinol (ZYLOPRIM) 100 MG tablet Take 3 tablets by mouth daily.  07/12/12  Yes Historical Provider, MD  amiodarone (PACERONE) 100 MG tablet Take 1 tablet (100 mg total) by mouth daily. 05/02/15  Yes [name], MD  atorvastatin (LIPITOR) 40 MG tablet  Take 1 tablet (40 mg total) by mouth daily. 07/14/14  Yes Bevelyn Buckles [name], MD  bisoprolol (ZEBETA) 10 MG tablet TAKE 1/2 TABLETS (5 MG TOTAL) BY MOUTH DAILY. 06/13/15  Yes [name], MD  colchicine 0.6 MG tablet Take 0.6 mg by mouth daily as needed. For gout   Yes Historical Provider, MD  digoxin (LANOXIN) 0.25 MG tablet TAKE 0.5 TABLETS (125 MCG TOTAL) BY MOUTH EVERY OTHER DAY. 09/12/15  Yes [name], MD  fluticasone (FLONASE) 50 MCG/ACT nasal spray Place 2 sprays into the nose daily as needed for allergies.  08/04/12  Yes Historical Provider, MD  Fluticasone-Salmeterol (ADVAIR DISKUS) 250-50 MCG/DOSE AEPB Inhale 1 puff into the lungs every 12 (twelve) hours as needed. For wheezing or shortness of breath   Yes Historical  Provider, MD  hydrALAZINE (APRESOLINE) 50 MG tablet Take 50 mg by mouth 2 (two) times daily.   Yes Historical Provider, MD  insulin detemir (LEVEMIR) 100 UNIT/ML injection Inject 28 Units into the skin at bedtime. 03/13/14  Yes Aundria Rud, NP  isosorbide mononitrate (IMDUR) 30 MG 24 hr tablet TAKE 1 TABLET (30 MG TOTAL) BY MOUTH 2 (TWO) TIMES DAILY. 11/24/14  Yes [name], MD  linagliptin (TRADJENTA) 5 MG TABS tablet Take 1 tablet (5 mg total) by mouth daily. 03/13/14  Yes Aundria Rud, NP  metolazone (ZAROXOLYN) 2.5 MG tablet Take 2.5 mg by mouth daily as needed (edema).   Yes Historical Provider, MD  montelukast (SINGULAIR) 10 MG tablet Take 10 mg by mouth daily as needed. For shortness of breath or wheezing   Yes Historical Provider, MD  potassium chloride SA (K-DUR,KLOR-CON) 20 MEQ tablet Take 20 mEq by mouth daily.   Yes Historical Provider, MD  rivaroxaban (XARELTO) 15 MG TABS tablet Take 1 tablet (15 mg total) by mouth daily. 05/18/14  Yes [name], MD  spironolactone (ALDACTONE) 25 MG tablet Take 0.5 tablets (12.5 mg total) by mouth daily. 03/13/14  Yes Aundria Rud, NP  torsemide (DEMADEX) 20 MG tablet TAKE 40 MG (2 TABLETS) EVERY MORNING AND 20 MG (1 TABLET) EVERY EVENING 07/09/15  Yes [name], MD  acetaminophen (TYLENOL) 325 MG tablet Take 650 mg by mouth every 6 (six) hours as needed (pain). Reported on 10/11/2015    Historical Provider, MD  clindamycin (CLEOCIN) 150 MG capsule Take 3 capsules (450 mg total) by mouth 4 (four) times daily. 10/14/15   Eyvonne Mechanic, [name]   BP 114/83 mmHg  Pulse 50  Temp(Src) 98.4 F (36.9 C) (Oral)  Resp 18  Ht  (1.651 m)  Wt 142.316 kg  BMI 52.21 kg/m2  SpO2 92%   Physical Exam  Constitutional: He is oriented to person, place, and time. He appears well-developed and well-nourished.  HENT:  Head: Normocephalic and atraumatic.  Eyes: Conjunctivae are normal. Pupils are equal, round, and reactive to light. Right eye  exhibits no discharge. Left eye exhibits no discharge. No scleral icterus.  Neck: Normal range of motion. No JVD present. No tracheal deviation present.  Pulmonary/Chest: Effort normal. No stridor.  Abdominal:  Abdominal distention   Musculoskeletal:  Scant lower extremity edema  Neurological: He is alert and oriented to person, place, and time. Coordination normal.  Psychiatric: He has a normal mood and affect. His behavior is normal. Judgment and thought content normal.  Nursing note and vitals reviewed.   ED Course  Procedures (including critical care time) Labs Review Labs Reviewed  COMPREHENSIVE METABOLIC PANEL -  Abnormal; Notable for the following:    Potassium 3.3 (*)    Chloride 97 (*)    Glucose, Bld 261 (*)    BUN 61 (*)    Creatinine, Ser 2.04 (*)    Albumin 3.3 (*)    Total Bilirubin 1.7 (*)    GFR calc non Af Amer 35 (*)    GFR calc Af Amer 41 (*)    All other components within normal limits  CBC - Abnormal; Notable for the following:    Hemoglobin 11.6 (*)    HCT 37.3 (*)    RDW 18.5 (*)    All other components within normal limits  BRAIN NATRIURETIC PEPTIDE - Abnormal; Notable for the following:    B Natriuretic Peptide 814.8 (*)    All other components within normal limits  LIPASE, BLOOD  URINALYSIS, ROUTINE W REFLEX MICROSCOPIC (NOT AT [facility])  HEMOGLOBIN A1C  I-STAT TROPOININ, ED    Imaging Review Ct Abdomen Pelvis Wo Contrast  10/14/2015  CLINICAL DATA:  Patient with generalized weakness and abdominal distension. EXAM: CT ABDOMEN AND PELVIS WITHOUT CONTRAST TECHNIQUE: Multidetector CT imaging of the abdomen and pelvis was performed following the standard protocol without IV contrast. COMPARISON:  Ultrasound abdomen 07/11/2011 FINDINGS: Lower chest: Cardiomegaly. Dependent atelectasis within the lung bases bilaterally. No pleural effusion. Hepatobiliary: Liver is diffusely low in attenuation compatible with steatosis. Layering high attenuation material within  the gallbladder lumen. No gallbladder wall thickening. No intrahepatic or extrahepatic biliary ductal dilatation. Pancreas: Unremarkable Spleen: Unremarkable Adrenals/Urinary Tract: Adrenal glands are normal. Kidneys are symmetric in size. No hydronephrosis. Urinary bladder is unremarkable. Stomach/Bowel: Sigmoid colonic diverticulosis. No CT evidence for acute diverticulitis. The appendix is normal. No evidence for bowel obstruction. No free fluid or free intraperitoneal air. Normal morphology to the stomach. Vascular/Lymphatic: Normal caliber abdominal aorta. No retroperitoneal lymphadenopathy. Other: Small fat containing bilateral inguinal hernias. Prostate unremarkable. Musculoskeletal: Lumbar spine degenerative changes. No aggressive or acute appearing osseous lesion. Small amount of fluid within umbilicus. Subcutaneous fat stranding involving the pannus. IMPRESSION: Subcutaneous fat stranding involving the pannus. Recommend clinical correlation to exclude the possibility of cellulitis. No acute intra-abdominal process identified. Hepatic steatosis. Cardiomegaly. Layering high attenuation in the gallbladder lumen suggestive of sludge. Electronically Signed   By: Annia Belt M.D.   On: 10/14/2015 14:37   Dg Chest 2 View  10/14/2015  CLINICAL DATA:  55 year old male with weakness, abdominal pain and nausea EXAM: CHEST  2 VIEW COMPARISON:  Prior chest x-ray 03/07/2014 FINDINGS: Stable position of left subclavian approach intracardiac defibrillator. There is marked enlargement of the cardiopericardial silhouette which is similar compared to prior. Background pulmonary vascular congestion without definitive edema. Limited lateral chest x-ray secondary to patient body habitus. No pleural effusion, pneumothorax or definite focal airspace consolidation. IMPRESSION: 1. Stable marked enlargement of the cardiopericardial silhouette compared to prior imaging which may reflect cardiomegaly and/or pericardial effusion. 2.  Stable position of intracardiac defibrillator. 3. Pulmonary vascular congestion without overt edema. Electronically Signed   By: Malachy Moan M.D.   On: 10/14/2015 09:59   I have personally reviewed and evaluated these images and lab results as part of my medical decision-making.   EKG Interpretation   Date/Time:  Sunday October 14 2015 08:45:03 EST Ventricular Rate:  75 PR Interval:    QRS Duration: 162 QT Interval:  489 QTC Calculation: 546 R Axis:   -106 Text Interpretation:  Atrial fibrillation/flutter Nonspecific IVCD with  LAD Confirmed by RAY MD, DANIELLE [phone]) on 10/14/2015 10:21:01  AM      MDM   Final diagnoses:  Abdominal pain  Cellulitis of trunk, unspecified site    Labs: [id number]  Imaging: DG chest 2 view, CT abdomen and pelvis- significant for fat stranding involving the pannus  Consults: Internal medicine, cardiology  Therapeutics:  Discharge Meds:   Assessment/Plan: 55 year-old male presents today with heart failure. Patient also has what appears to be cellulitis to his pannus. Patient is nontoxic, with no significant elevation in white count, afebrile. She will be treated with clindamycin here in the ED, hospital admission for heart failure and cellulitic treatment. Cardiology was consulted  to do a consult on the patient for additional management of heart failure. Patient remained stable while here in the ED.     Eyvonne Mechanic, [name] 10/14/15 1637  Margarita Grizzle, MD 10/20/15 [phone]

## 2015-10-14 NOTE — ED Notes (Signed)
Attempted report.  Nurse to call back when available. 

## 2015-10-14 NOTE — ED Notes (Signed)
Received pt from home with c/o general weakness and abdomen distension x 1 week. Pt seen by cardiologist 2 days ago and was told he had a little fluid on him. Pt also c/o darkness of his urine in the morning that lightens up throughout the day.

## 2015-10-14 NOTE — Discharge Instructions (Signed)
Cellulitis Cellulitis is an infection of the skin and the tissue beneath it. The infected area is usually red and tender. Cellulitis occurs most often in the arms and lower legs.  CAUSES  Cellulitis is caused by bacteria that enter the skin through cracks or cuts in the skin. The most common types of bacteria that cause cellulitis are staphylococci and streptococci. SIGNS AND SYMPTOMS   Redness and warmth.  Swelling.  Tenderness or pain.  Fever. DIAGNOSIS  Your health care provider can usually determine what is wrong based on a physical exam. Blood tests may also be done. TREATMENT  Treatment usually involves taking an antibiotic medicine. HOME CARE INSTRUCTIONS   Take your antibiotic medicine as directed by your health care provider. Finish the antibiotic even if you start to feel better.  Keep the infected arm or leg elevated to reduce swelling.  Apply a warm cloth to the affected area up to 4 times per day to relieve pain.  Take medicines only as directed by your health care provider.  Keep all follow-up visits as directed by your health care provider. SEEK MEDICAL CARE IF:   You notice red streaks coming from the infected area.  Your red area gets larger or turns dark in color.  Your bone or joint underneath the infected area becomes painful after the skin has healed.  Your infection returns in the same area or another area.  You notice a swollen bump in the infected area.  You develop new symptoms.  You have a fever. SEEK IMMEDIATE MEDICAL CARE IF:   You feel very sleepy.  You develop vomiting or diarrhea.  You have a general ill feeling (malaise) with muscle aches and pains.   This information is not intended to replace advice given to you by your health care provider. Make sure you discuss any questions you have with your health care provider.   Document Released: 07/02/2005 Document Revised: 06/13/2015 Document Reviewed: 12/08/2011 Elsevier Interactive  Patient Education [employer]      Please take antibiotics as directed. Please contact her primary care provider and schedule follow-up evaluation tomorrow. If any new or worsening signs or symptoms present including worsening infection, fever, chills, persistent vomiting, or any other concerning signs or symptoms please return to the emergency room immediately. Please inform your primary care provider of all relevant data today, please request repeat kidney function tests.

## 2015-10-15 ENCOUNTER — Inpatient Hospital Stay (HOSPITAL_COMMUNITY): Payer: Medicare Other

## 2015-10-15 ENCOUNTER — Other Ambulatory Visit (HOSPITAL_COMMUNITY): Payer: Medicare Other

## 2015-10-15 DIAGNOSIS — I509 Heart failure, unspecified: Secondary | ICD-10-CM

## 2015-10-15 DIAGNOSIS — I480 Paroxysmal atrial fibrillation: Secondary | ICD-10-CM

## 2015-10-15 DIAGNOSIS — N183 Chronic kidney disease, stage 3 (moderate): Secondary | ICD-10-CM

## 2015-10-15 DIAGNOSIS — N179 Acute kidney failure, unspecified: Secondary | ICD-10-CM

## 2015-10-15 LAB — BASIC METABOLIC PANEL
ANION GAP: 10 (ref 5–15)
BUN: 57 mg/dL — ABNORMAL HIGH (ref 6–20)
CALCIUM: 9.4 mg/dL (ref 8.9–10.3)
CHLORIDE: 100 mmol/L — AB (ref 101–111)
CO2: 30 mmol/L (ref 22–32)
Creatinine, Ser: 2.06 mg/dL — ABNORMAL HIGH (ref 0.61–1.24)
GFR calc non Af Amer: 35 mL/min — ABNORMAL LOW (ref >60)
GFR, EST AFRICAN AMERICAN: 40 mL/min — AB (ref >60)
GLUCOSE: 179 mg/dL — AB (ref 65–99)
POTASSIUM: 3.9 mmol/L (ref 3.5–5.1)
Sodium: 140 mmol/L (ref 135–145)

## 2015-10-15 LAB — GLUCOSE, CAPILLARY
GLUCOSE-CAPILLARY: 163 mg/dL — AB (ref 65–99)
Glucose-Capillary: 177 mg/dL — ABNORMAL HIGH (ref 65–99)
Glucose-Capillary: 119 mg/dL — ABNORMAL HIGH (ref 65–99)
Glucose-Capillary: 162 mg/dL — ABNORMAL HIGH (ref 65–99)

## 2015-10-15 LAB — MRSA PCR SCREENING: MRSA by PCR: NEGATIVE

## 2015-10-15 MED ORDER — POTASSIUM CHLORIDE CRYS ER 20 MEQ PO TBCR
20.0000 meq | EXTENDED_RELEASE_TABLET | Freq: Two times a day (BID) | ORAL | Status: DC
  Administered 2015-10-15: 20 meq via ORAL
  Filled 2015-10-15 (×3): qty 1

## 2015-10-15 MED ORDER — HYDRALAZINE HCL 50 MG PO TABS
50.0000 mg | ORAL_TABLET | Freq: Three times a day (TID) | ORAL | Status: DC
  Administered 2015-10-15 – 2015-10-19 (×12): 50 mg via ORAL
  Filled 2015-10-15 (×12): qty 1

## 2015-10-15 MED ORDER — SODIUM CHLORIDE 0.9 % IJ SOLN
3.0000 mL | Freq: Two times a day (BID) | INTRAMUSCULAR | Status: DC
  Administered 2015-10-16 – 2015-10-19 (×6): 3 mL via INTRAVENOUS

## 2015-10-15 MED ORDER — MILRINONE IN DEXTROSE 20 MG/100ML IV SOLN
0.1250 ug/kg/min | INTRAVENOUS | Status: DC
  Administered 2015-10-15 – 2015-10-17 (×6): 0.25 ug/kg/min via INTRAVENOUS
  Administered 2015-10-18: 0.125 ug/kg/min via INTRAVENOUS
  Filled 2015-10-15 (×7): qty 100

## 2015-10-15 MED ORDER — CHLORHEXIDINE GLUCONATE 0.12 % MT SOLN
15.0000 mL | Freq: Two times a day (BID) | OROMUCOSAL | Status: DC
  Administered 2015-10-15 – 2015-10-19 (×3): 15 mL via OROMUCOSAL
  Filled 2015-10-15 (×3): qty 15

## 2015-10-15 MED ORDER — FUROSEMIDE 10 MG/ML IJ SOLN
120.0000 mg | Freq: Two times a day (BID) | INTRAMUSCULAR | Status: DC
Start: 2015-10-15 — End: 2015-10-18
  Administered 2015-10-15 – 2015-10-18 (×6): 120 mg via INTRAVENOUS
  Filled 2015-10-15 (×8): qty 12

## 2015-10-15 MED ORDER — CETYLPYRIDINIUM CHLORIDE 0.05 % MT LIQD
7.0000 mL | Freq: Two times a day (BID) | OROMUCOSAL | Status: DC
  Administered 2015-10-15 – 2015-10-19 (×4): 7 mL via OROMUCOSAL

## 2015-10-15 MED ORDER — SODIUM CHLORIDE 0.9 % IJ SOLN: 3.0000 mL | INTRAMUSCULAR | Status: DC | PRN

## 2015-10-15 MED ORDER — SODIUM CHLORIDE 0.9 % IV SOLN: 250.0000 mL | INTRAVENOUS | Status: DC

## 2015-10-15 MED ORDER — METOLAZONE 5 MG PO TABS
5.0000 mg | ORAL_TABLET | Freq: Once | ORAL | Status: AC
  Administered 2015-10-16: 5 mg via ORAL
  Filled 2015-10-15: qty 1

## 2015-10-15 MED ORDER — PERFLUTREN LIPID MICROSPHERE
1.0000 mL | INTRAVENOUS | Status: AC | PRN
  Administered 2015-10-15: 2 mL via INTRAVENOUS
  Filled 2015-10-15: qty 10

## 2015-10-15 NOTE — Plan of Care (Signed)
Problem: Education: Goal: Ability to verbalize understanding of medication therapies will improve Outcome: Progressing Given information on Heart failure and discussed medications

## 2015-10-15 NOTE — Progress Notes (Signed)
9983 Came to see to walk. Pt asked that I check back later as he has not eaten and feeling very weak. Will follow up. Luetta Nutting RN BSN 10/15/2015 9:48 AM

## 2015-10-15 NOTE — Progress Notes (Signed)
[facility] PROGRESS NOTE  Gary Hutchinson AOZ:[id number] DOB: Aug 31, 1961 DOA: 10/14/2015 PCP: [name], MD Brief History: 55 year old male patient with known chronic combined systolic and diastolic heart failure, morbid obesity, ischemic cardiomyopathy, ICD placement leading of history of ventricular tachycardia, diabetes on insulin, hypertension, iron deficiency anemia, atrial fibrillation on amiodarone and anticoagulation, gout, sleep apnea and pulmonary hypertension presents with generalized weakness, cough, nausea, vomiting.   Assessment/Plan: Acute on chronic combined systolic and diastolic congestive heart failure, NYHA class 2 /pulmonary hypertension: Admitted to step down and started on IV lasix, cardiology consulted and recommendations given.  Strict intake and output.  Daily weights.  Echocardiogram ordered.  Elevated bnp.    Acute renal failure on CKD stage 3: Stable.   OSA:  On BIPAP at night.    Hypertension: Well controlled.    Nausea: Better today.   ATrial fibrillation: - on amiodarone.  Resume xarelto, plan for cardioversion in am.   Panniculitis: - on clindamycin.     Code Status: full code.  Family Communication: none at bedside Disposition Plan: pending.    Consultants:  Cardiology.   Procedures:  none  Antibiotics:  Clindamycin.   HPI/Subjective: Wants to eat.  Objective: Filed Vitals:   10/15/15 1600 10/15/15 1826  BP: 100/70 116/99  Pulse: 74   Temp: 97.7 F (36.5 C)   Resp: 24     Intake/Output Summary (Last 24 hours) at 10/15/15 1848 Last data filed at 10/15/15 1223  Gross per 24 hour  Intake 379.67 ml  Output   1400 ml  Net -1020.33 ml   Filed Weights   10/14/15 0848 10/14/15 2108 10/15/15 0400  Weight: 142.316 kg (313 lb 12 oz) 141 kg (310 lb 13.6 oz) 141.2 kg (311 lb 4.6 oz)    Exam:   General:  Alert afebrile comfortable.   Cardiovascular: s1s2  Respiratory: ctab, no wheezing, diminished air  entry at  Bases.  Abdomen: soft NT nd bs+  Musculoskeletal: pedal edema.   Data Reviewed: Basic Metabolic Panel:  Recent Labs Lab 10/11/15 1617 10/14/15 0900 10/15/15 0348  NA 139 137 140  K 3.4* 3.3* 3.9  CL 99* 97* 100*  CO2 GLUCOSE 159* 261* 179*  BUN 55* 61* 57*  CREATININE 2.10* 2.04* 2.06*  CALCIUM 9.7 9.1 9.4   Liver Function Tests:  Recent Labs Lab 10/14/15 0900  AST 34  ALT 31  ALKPHOS 108  BILITOT 1.7*  PROT 6.9  ALBUMIN 3.3*    Recent Labs Lab 10/14/15 0900  LIPASE 35   No results for input(s): AMMONIA in the last 168 hours. CBC:  Recent Labs Lab 10/14/15 0900  WBC 5.8  HGB 11.6*  HCT 37.3*  MCV 86.1  PLT 152   Cardiac Enzymes: No results for input(s): CKTOTAL, CKMB, CKMBINDEX, TROPONINI in the last 168 hours. BNP (last 3 results)  Recent Labs  10/14/15 0900  BNP 814.8*    ProBNP (last 3 results) No results for input(s): PROBNP in the last 8760 hours.  CBG:  Recent Labs Lab 10/14/15 2240 10/15/15 0843 10/15/15 1355 10/15/15 1642  GLUCAP 175* 177* 119* 162*    Recent Results (from the past 240 hour(s))  MRSA PCR Screening     Status: None   Collection Time: 10/14/15  9:06 PM  Result Value Ref Range Status   MRSA by PCR NEGATIVE NEGATIVE Final    Comment:        The GeneXpert MRSA Assay (FDA approved for NASAL  specimens only), is one component of a comprehensive MRSA colonization surveillance program. It is not intended to diagnose MRSA infection nor to guide or monitor treatment for MRSA infections.      Studies: Ct Abdomen Pelvis Wo Contrast  10/14/2015  CLINICAL DATA:  Patient with generalized weakness and abdominal distension. EXAM: CT ABDOMEN AND PELVIS WITHOUT CONTRAST TECHNIQUE: Multidetector CT imaging of the abdomen and pelvis was performed following the standard protocol without IV contrast. COMPARISON:  Ultrasound abdomen 07/11/2011 FINDINGS: Lower chest: Cardiomegaly. Dependent atelectasis  within the lung bases bilaterally. No pleural effusion. Hepatobiliary: Liver is diffusely low in attenuation compatible with steatosis. Layering high attenuation material within the gallbladder lumen. No gallbladder wall thickening. No intrahepatic or extrahepatic biliary ductal dilatation. Pancreas: Unremarkable Spleen: Unremarkable Adrenals/Urinary Tract: Adrenal glands are normal. Kidneys are symmetric in size. No hydronephrosis. Urinary bladder is unremarkable. Stomach/Bowel: Sigmoid colonic diverticulosis. No CT evidence for acute diverticulitis. The appendix is normal. No evidence for bowel obstruction. No free fluid or free intraperitoneal air. Normal morphology to the stomach. Vascular/Lymphatic: Normal caliber abdominal aorta. No retroperitoneal lymphadenopathy. Other: Small fat containing bilateral inguinal hernias. Prostate unremarkable. Musculoskeletal: Lumbar spine degenerative changes. No aggressive or acute appearing osseous lesion. Small amount of fluid within umbilicus. Subcutaneous fat stranding involving the pannus. IMPRESSION: Subcutaneous fat stranding involving the pannus. Recommend clinical correlation to exclude the possibility of cellulitis. No acute intra-abdominal process identified. Hepatic steatosis. Cardiomegaly. Layering high attenuation in the gallbladder lumen suggestive of sludge. Electronically Signed   By: Annia Belt M.D.   On: 10/14/2015 14:37   Dg Chest 2 View  10/14/2015  CLINICAL DATA:  55 year old male with weakness, abdominal pain and nausea EXAM: CHEST  2 VIEW COMPARISON:  Prior chest x-ray 03/07/2014 FINDINGS: Stable position of left subclavian approach intracardiac defibrillator. There is marked enlargement of the cardiopericardial silhouette which is similar compared to prior. Background pulmonary vascular congestion without definitive edema. Limited lateral chest x-ray secondary to patient body habitus. No pleural effusion, pneumothorax or definite focal airspace  consolidation. IMPRESSION: 1. Stable marked enlargement of the cardiopericardial silhouette compared to prior imaging which may reflect cardiomegaly and/or pericardial effusion. 2. Stable position of intracardiac defibrillator. 3. Pulmonary vascular congestion without overt edema. Electronically Signed   By: Malachy Moan M.D.   On: 10/14/2015 09:59    Scheduled Meds: . amiodarone  400 mg Oral Daily  . antiseptic oral rinse  7 mL Mouth Rinse q12n4p  . atorvastatin  40 mg Oral Daily  . bisoprolol  5 mg Oral Daily  . chlorhexidine  15 mL Mouth Rinse BID  . clindamycin (CLEOCIN) IV  600 mg Intravenous 4 times per day  . digoxin  0.125 mg Oral Daily  . furosemide  120 mg Intravenous Q12H  . hydrALAZINE  50 mg Oral TID  . insulin aspart  0-20 Units Subcutaneous TID WC  . insulin aspart  0-5 Units Subcutaneous QHS  . insulin detemir  28 Units Subcutaneous QHS  . isosorbide mononitrate  30 mg Oral Daily  . linagliptin  5 mg Oral Daily  . metolazone  5 mg Oral Once  . montelukast  10 mg Oral QHS  . potassium chloride SA  20 mEq Oral BID  . rivaroxaban  15 mg Oral Q supper  . sodium chloride  3 mL Intravenous Q12H  . sodium chloride  3 mL Intravenous Q12H   Continuous Infusions: . sodium chloride    . milrinone 0.25 mcg/kg/min (10/15/15 1618)    Principal Problem:  Acute on chronic combined systolic and diastolic congestive heart failure, NYHA class 2 (HCC) Active Problems:   Insulin dependent type 2 diabetes mellitus, uncontrolled (HCC)   Morbid obesity (HCC)   ANEMIA-IRON DEFICIENCY   Obstructive sleep apnea   HTN (hypertension)   Pulmonary HTN (HCC)   Atrial fibrillation (HCC)   Acute renal failure superimposed on stage 3 chronic kidney disease (HCC)   Acute panniculitis   Acute on chronic systolic heart failure, NYHA class 2 (HCC)    Time spent: 25 minutes.     [facility]  [facility] Pager [phone]. If 7PM-7AM, please contact night-coverage at  [email or web address], password [facility] 10/15/2015, 6:48 PM  LOS: 1 day

## 2015-10-15 NOTE — Progress Notes (Signed)
Utilization Review Completed.Gary Hutchinson T1/06/2016  

## 2015-10-15 NOTE — Progress Notes (Signed)
1350 Waited until pt had eaten to attempt ambulation. Pt stated he went too long without eating and now he is nauseated even after eating some lunch. Encouraged pt to walk later with staff as he stated he would like to walk when feeling better. Luetta Nutting RN BSN 10/15/2015 1:51 PM

## 2015-10-15 NOTE — Progress Notes (Addendum)
Advanced Heart Failure Rounding Note   Subjective:    Admitted with dyspnea,volume overload, and cellitis. EKG with A flutter. Diuresed with 120 mg IV lasix twice daily. Weight up 1 pound. Limited diuresis noted.  Started on antibiotics.     Denies SOB. Complaining of fatigue. Stomach feels bad.    Objective:   Weight Range:  Vital Signs:   Temp:  [97.4 F (36.3 C)-98.8 F (37.1 C)] 97.6 F (36.4 C) (01/09 0400) Pulse Rate:  [44-65] 51 (01/09 0600) Resp:  [13-26] 22 (01/09 0600) BP: (101-141)/(51-96) 132/79 mmHg (01/09 0413) SpO2:  [88 %-98 %] 92 % (01/09 0600) Weight:  [310 lb 13.6 oz (141 kg)-313 lb 12 oz (142.316 kg)] 311 lb 4.6 oz (141.2 kg) (01/09 0400) Last BM Date: 10/11/15  Weight change: Filed Weights   10/14/15 0848 10/14/15 2108 10/15/15 0400  Weight: 313 lb 12 oz (142.316 kg) 310 lb 13.6 oz (141 kg) 311 lb 4.6 oz (141.2 kg)    Intake/Output:   Intake/Output Summary (Last 24 hours) at 10/15/15 0803 Last data filed at 10/15/15 0600  Gross per 24 hour  Intake 379.67 ml  Output    900 ml  Net -520.33 ml     Physical Exam: General:  Fatigued. No resp difficulty. In bed HEENT: normal Neck: supple. JVP to jaw. Carotids 2+ bilat; no bruits. No lymphadenopathy or thryomegaly appreciated. Cor: PMI nonpalpable. Regular rate & rhythm. No rubs, gallops or murmurs. Lungs: clear Abdomen: soft, nontender, distended. No hepatosplenomegaly. No bruits or masses. Good bowel sounds. Extremities: no cyanosis, clubbing, rash, R and LLE 2+ edema. Cool.  Neuro: alert & orientedx3, cranial nerves grossly intact. moves all 4 extremities w/o difficulty. Affect pleasant  Telemetry:  A flutter 80 bpm.   Labs: Basic Metabolic Panel:  Recent Labs Lab 10/11/15 1617 10/14/15 0900 10/15/15 0348  NA 139 137 140  K 3.4* 3.3* 3.9  CL 99* 97* 100*  CO2 27 25 30   GLUCOSE 159* 261* 179*  BUN 55* 61* 57*  CREATININE 2.10* 2.04* 2.06*  CALCIUM 9.7 9.1 9.4    Liver  Function Tests:  Recent Labs Lab 10/14/15 0900  AST 34  ALT 31  ALKPHOS 108  BILITOT 1.7*  PROT 6.9  ALBUMIN 3.3*    Recent Labs Lab 10/14/15 0900  LIPASE 35   No results for input(s): AMMONIA in the last 168 hours.  CBC:  Recent Labs Lab 10/14/15 0900  WBC 5.8  HGB 11.6*  HCT 37.3*  MCV 86.1  PLT 152    Cardiac Enzymes: No results for input(s): CKTOTAL, CKMB, CKMBINDEX, TROPONINI in the last 168 hours.  BNP: BNP (last 3 results)  Recent Labs  10/14/15 0900  BNP 814.8*    ProBNP (last 3 results) No results for input(s): PROBNP in the last 8760 hours.    Other results:  Imaging: Ct Abdomen Pelvis Wo Contrast  10/14/2015  CLINICAL DATA:  Patient with generalized weakness and abdominal distension. EXAM: CT ABDOMEN AND PELVIS WITHOUT CONTRAST TECHNIQUE: Multidetector CT imaging of the abdomen and pelvis was performed following the standard protocol without IV contrast. COMPARISON:  Ultrasound abdomen 07/11/2011 FINDINGS: Lower chest: Cardiomegaly. Dependent atelectasis within the lung bases bilaterally. No pleural effusion. Hepatobiliary: Liver is diffusely low in attenuation compatible with steatosis. Layering high attenuation material within the gallbladder lumen. No gallbladder wall thickening. No intrahepatic or extrahepatic biliary ductal dilatation. Pancreas: Unremarkable Spleen: Unremarkable Adrenals/Urinary Tract: Adrenal glands are normal. Kidneys are symmetric in size. No hydronephrosis. Urinary  bladder is unremarkable. Stomach/Bowel: Sigmoid colonic diverticulosis. No CT evidence for acute diverticulitis. The appendix is normal. No evidence for bowel obstruction. No free fluid or free intraperitoneal air. Normal morphology to the stomach. Vascular/Lymphatic: Normal caliber abdominal aorta. No retroperitoneal lymphadenopathy. Other: Small fat containing bilateral inguinal hernias. Prostate unremarkable. Musculoskeletal: Lumbar spine degenerative changes. No  aggressive or acute appearing osseous lesion. Small amount of fluid within umbilicus. Subcutaneous fat stranding involving the pannus. IMPRESSION: Subcutaneous fat stranding involving the pannus. Recommend clinical correlation to exclude the possibility of cellulitis. No acute intra-abdominal process identified. Hepatic steatosis. Cardiomegaly. Layering high attenuation in the gallbladder lumen suggestive of sludge. Electronically Signed   By: Annia Belt M.D.   On: 10/14/2015 14:37   Dg Chest 2 View  10/14/2015  CLINICAL DATA:  55 year old male with weakness, abdominal pain and nausea EXAM: CHEST  2 VIEW COMPARISON:  Prior chest x-ray 03/07/2014 FINDINGS: Stable position of left subclavian approach intracardiac defibrillator. There is marked enlargement of the cardiopericardial silhouette which is similar compared to prior. Background pulmonary vascular congestion without definitive edema. Limited lateral chest x-ray secondary to patient body habitus. No pleural effusion, pneumothorax or definite focal airspace consolidation. IMPRESSION: 1. Stable marked enlargement of the cardiopericardial silhouette compared to prior imaging which may reflect cardiomegaly and/or pericardial effusion. 2. Stable position of intracardiac defibrillator. 3. Pulmonary vascular congestion without overt edema. Electronically Signed   By: Malachy Moan M.D.   On: 10/14/2015 09:59      Medications:     Scheduled Medications: . amiodarone  400 mg Oral Daily  . antiseptic oral rinse  7 mL Mouth Rinse q12n4p  . atorvastatin  40 mg Oral Daily  . bisoprolol  5 mg Oral Daily  . chlorhexidine  15 mL Mouth Rinse BID  . clindamycin (CLEOCIN) IV  600 mg Intravenous 4 times per day  . digoxin  0.125 mg Oral Daily  . furosemide  120 mg Intravenous BID  . hydrALAZINE  50 mg Oral BID  . insulin aspart  0-20 Units Subcutaneous TID WC  . insulin aspart  0-5 Units Subcutaneous QHS  . insulin detemir  28 Units Subcutaneous QHS  .  isosorbide mononitrate  30 mg Oral Daily  . linagliptin  5 mg Oral Daily  . montelukast  10 mg Oral QHS  . potassium chloride SA  20 mEq Oral Daily  . potassium chloride SA  20 mEq Oral Daily  . rivaroxaban  15 mg Oral Q supper  . sodium chloride  3 mL Intravenous Q12H     Infusions:     PRN Medications:  sodium chloride, acetaminophen, albuterol, fluticasone, ondansetron (ZOFRAN) IV, sodium chloride   Assessment:  1. A/C Systolic/ Diastolic HF 2. A flutter-  3. CKD Stage IV 4. Cellulitis 5. HTN 6. Morbid Obesity     Plan/Discussion:    Volume Status remains elevated. Continue lasix 120 mg IV twice a day and add 5 mg metolazone. Continue 5 mg bisoprolol daily, increase hydralazine 50 mg tid, continue imdur 30 mg daily. No spiro for now with CKD. Continue dig 0.125 mg daily. Dig level on admit 0.6. Add ted hose.   Remains in A flutter. Trying to schedule cardioversion possibly today or tomorrow.  Has been on Xarelto 15 mg daily.   Continue antibiotics per primary team.    Consult cardiac rehab.    Length of Stay: 1   Amy Clegg NP-C  10/15/2015, 8:03 AM  Advanced Heart Failure Team Pager [phone] (M-F; 7a -  4p)  Please contact [facility] for night-coverage after hours (4p -7a ) and weekends on [email or web address]  Patient seen and examined with Tonye Becket, NP. We discussed all aspects of the encounter. I agree with the assessment and plan as stated above.   I worry he is low output in setting of AFL. Agree with increasing diuretics. Will add milrinone for inotropic support. Watch renal function closely. Will plan DC-CV tomorrow. Make NPO after MN. Continue xarelto. Abx for cellulitis.   Audi Wettstein,MD 1:51 PM

## 2015-10-15 NOTE — Progress Notes (Signed)
  Echocardiogram 2D Echocardiogram with Definity has been performed.  Leta Jungling M 10/15/2015, 9:08 AM

## 2015-10-16 ENCOUNTER — Ambulatory Visit: Payer: Medicare Other | Admitting: Adult Health

## 2015-10-16 DIAGNOSIS — I4892 Unspecified atrial flutter: Secondary | ICD-10-CM

## 2015-10-16 DIAGNOSIS — M793 Panniculitis, unspecified: Secondary | ICD-10-CM

## 2015-10-16 DIAGNOSIS — N179 Acute kidney failure, unspecified: Secondary | ICD-10-CM | POA: Insufficient documentation

## 2015-10-16 DIAGNOSIS — N184 Chronic kidney disease, stage 4 (severe): Secondary | ICD-10-CM

## 2015-10-16 LAB — CBC WITH DIFFERENTIAL/PLATELET
BASOS ABS: 0 10*3/uL (ref 0.0–0.1)
Basophils Relative: 0 %
Eosinophils Absolute: 0.1 10*3/uL (ref 0.0–0.7)
Eosinophils Relative: 3 %
HEMATOCRIT: 39.9 % (ref 39.0–52.0)
Hemoglobin: 12.3 g/dL — ABNORMAL LOW (ref 13.0–17.0)
LYMPHS PCT: 18 %
Lymphs Abs: 0.9 10*3/uL (ref 0.7–4.0)
MCH: 27.3 pg (ref 26.0–34.0)
MCHC: 30.8 g/dL (ref 30.0–36.0)
MCV: 88.5 fL (ref 78.0–100.0)
MONO ABS: 0.5 10*3/uL (ref 0.1–1.0)
MONOS PCT: 9 %
NEUTROS ABS: 3.5 10*3/uL (ref 1.7–7.7)
Neutrophils Relative %: 70 %
Platelets: 171 10*3/uL (ref 150–400)
RBC: 4.51 MIL/uL (ref 4.22–5.81)
RDW: 19.4 % — AB (ref 11.5–15.5)
WBC: 5 10*3/uL (ref 4.0–10.5)

## 2015-10-16 LAB — BASIC METABOLIC PANEL
Anion gap: 11 (ref 5–15)
BUN: 51 mg/dL — AB (ref 6–20)
CHLORIDE: 98 mmol/L — AB (ref 101–111)
CO2: 31 mmol/L (ref 22–32)
Calcium: 9.2 mg/dL (ref 8.9–10.3)
Creatinine, Ser: 1.93 mg/dL — ABNORMAL HIGH (ref 0.61–1.24)
GFR calc Af Amer: 44 mL/min — ABNORMAL LOW (ref >60)
GFR calc non Af Amer: 38 mL/min — ABNORMAL LOW (ref >60)
GLUCOSE: 121 mg/dL — AB (ref 65–99)
POTASSIUM: 3.4 mmol/L — AB (ref 3.5–5.1)
Sodium: 140 mmol/L (ref 135–145)

## 2015-10-16 LAB — HEMOGLOBIN A1C
HEMOGLOBIN A1C: 8.9 % — AB (ref 4.8–5.6)
Mean Plasma Glucose: 209 mg/dL

## 2015-10-16 LAB — GLUCOSE, CAPILLARY
GLUCOSE-CAPILLARY: 176 mg/dL — AB (ref 65–99)
Glucose-Capillary: 83 mg/dL (ref 65–99)
Glucose-Capillary: 177 mg/dL — ABNORMAL HIGH (ref 65–99)
Glucose-Capillary: 144 mg/dL — ABNORMAL HIGH (ref 65–99)

## 2015-10-16 LAB — DIGOXIN LEVEL: DIGOXIN LVL: 0.7 ng/mL — AB (ref 0.8–2.0)

## 2015-10-16 MED ORDER — BISACODYL 10 MG RE SUPP
10.0000 mg | Freq: Every day | RECTAL | Status: DC | PRN
  Administered 2015-10-16: 10 mg via RECTAL
  Filled 2015-10-16: qty 1

## 2015-10-16 MED ORDER — POTASSIUM CHLORIDE CRYS ER 20 MEQ PO TBCR
40.0000 meq | EXTENDED_RELEASE_TABLET | Freq: Two times a day (BID) | ORAL | Status: DC
  Administered 2015-10-16 – 2015-10-18 (×5): 40 meq via ORAL
  Filled 2015-10-16 (×5): qty 2

## 2015-10-16 MED ORDER — SENNOSIDES-DOCUSATE SODIUM 8.6-50 MG PO TABS: 2.0000 | ORAL_TABLET | Freq: Every evening | ORAL | Status: DC | PRN

## 2015-10-16 MED ORDER — POTASSIUM CHLORIDE CRYS ER 20 MEQ PO TBCR
40.0000 meq | EXTENDED_RELEASE_TABLET | Freq: Once | ORAL | Status: AC
  Administered 2015-10-16: 40 meq via ORAL

## 2015-10-16 MED ORDER — RIVAROXABAN 20 MG PO TABS
20.0000 mg | ORAL_TABLET | Freq: Every day | ORAL | Status: DC
  Administered 2015-10-16 – 2015-10-18 (×3): 20 mg via ORAL
  Filled 2015-10-16 (×3): qty 1

## 2015-10-16 MED ORDER — DOCUSATE SODIUM 100 MG PO CAPS
200.0000 mg | ORAL_CAPSULE | Freq: Every day | ORAL | Status: DC
  Administered 2015-10-16 – 2015-10-17 (×2): 200 mg via ORAL
  Filled 2015-10-16 (×4): qty 2

## 2015-10-16 MED ORDER — METOLAZONE 5 MG PO TABS
5.0000 mg | ORAL_TABLET | Freq: Every day | ORAL | Status: DC
  Administered 2015-10-16: 5 mg via ORAL
  Filled 2015-10-16 (×3): qty 1

## 2015-10-16 MED ORDER — POLYETHYLENE GLYCOL 3350 17 G PO PACK: 17.0000 g | PACK | Freq: Every day | ORAL | Status: DC | PRN

## 2015-10-16 NOTE — Progress Notes (Signed)
Patient states he can place himself on CPAP when ready. °

## 2015-10-16 NOTE — Progress Notes (Signed)
CARDIAC REHAB PHASE I   PRE:  Rate/Rhythm: 70 aflutter     BP: sitting 124/92    SaO2: 95 RA  MODE:  Ambulation: 340 ft   POST:  Rate/Rhythm: ? 60 aflutter    BP: sitting 99/77     SaO2: 88-89 Ra  Pt reluctant to walk, stating this isn't a good time. Offered to help pt to recliner. He agreed then agreed to walk. Slow pace, steady. HR seemed to decrease with walking however it could have been the monitor not picking up well. To recliner. Pt asked about CRPII and sts he wants to get moving and make better choices again. Will send referral. Discussed low sodium and low fluids. Voiced understanding, left diet sheets. Sts he plans to get back to weighing daily. [phone]   Elissa Lovett [place] CES, ACSM 10/16/2015 1:51 PM

## 2015-10-16 NOTE — Plan of Care (Signed)
Problem: Education: Goal: Ability to verbalize understanding of medication therapies will improve Outcome: Progressing Discussed cardioversion and gave information sheet

## 2015-10-16 NOTE — Progress Notes (Signed)
Advanced Heart Failure Rounding Note   Subjective:    Admitted with dyspnea,volume overload, and cellitis. EKG with A flutter. Diuresed with 120 mg IV lasix twice daily + metolazone.  Started on antibiotics per primary team.     Yesterday he was started on milrinone 0.25 mcg due to concern for low output. Weight down 2 pounds.  Limited diuresis noted.      Overall feeling better today. Denies SOB.     Objective:   Weight Range:  Vital Signs:   Temp:  [97.4 F (36.3 C)-98 F (36.7 C)] 98 F (36.7 C) (01/10 0829) Pulse Rate:  [52-87] 62 (01/10 1013) Resp:  [16-32] 18 (01/10 0829) BP: (100-143)/(57-99) 117/63 mmHg (01/10 1013) SpO2:  [92 %-100 %] 94 % (01/10 0829) Weight:  [309 lb 8.4 oz (140.4 kg)] 309 lb 8.4 oz (140.4 kg) (01/10 0500) Last BM Date: 10/11/15  Weight change: Filed Weights   10/14/15 2108 10/15/15 0400 10/16/15 0500  Weight: 310 lb 13.6 oz (141 kg) 311 lb 4.6 oz (141.2 kg) 309 lb 8.4 oz (140.4 kg)    Intake/Output:   Intake/Output Summary (Last 24 hours) at 10/16/15 1155 Last data filed at 10/16/15 1124  Gross per 24 hour  Intake 1307.22 ml  Output   2100 ml  Net -792.78 ml     Physical Exam: General:  No resp difficulty. Sitting on the side of the bed.  HEENT: normal Neck: supple. JVP ~10 difficult to assess. Carotids 2+ bilat; no bruits. No lymphadenopathy or thryomegaly appreciated. Cor: PMI nonpalpable. Regular rate & rhythm. No rubs, gallops or murmurs. Lungs: clear Abdomen: soft, nontender, distended. No hepatosplenomegaly. No bruits or masses. Good bowel sounds. Extremities: no cyanosis, clubbing, rash, R and LLE 2+ edema. Warm.  Neuro: alert & orientedx3, cranial nerves grossly intact. moves all 4 extremities w/o difficulty. Affect pleasant  Telemetry:  A flutter 60-70s bpm.   Labs: Basic Metabolic Panel:  Recent Labs Lab 10/11/15 1617 10/14/15 0900 10/15/15 0348 10/16/15 0239  NA 139 137 140 140  K 3.4* 3.3* 3.9 3.4*  CL  99* 97* 100* 98*  CO2 27 25 30 31   GLUCOSE 159* 261* 179* 121*  BUN 55* 61* 57* 51*  CREATININE 2.10* 2.04* 2.06* 1.93*  CALCIUM 9.7 9.1 9.4 9.2    Liver Function Tests:  Recent Labs Lab 10/14/15 0900  AST 34  ALT 31  ALKPHOS 108  BILITOT 1.7*  PROT 6.9  ALBUMIN 3.3*    Recent Labs Lab 10/14/15 0900  LIPASE 35   No results for input(s): AMMONIA in the last 168 hours.  CBC:  Recent Labs Lab 10/14/15 0900 10/16/15 0239  WBC 5.8 5.0  NEUTROABS  --  3.5  HGB 11.6* 12.3*  HCT 37.3* 39.9  MCV 86.1 88.5  PLT 152 171    Cardiac Enzymes: No results for input(s): CKTOTAL, CKMB, CKMBINDEX, TROPONINI in the last 168 hours.  BNP: BNP (last 3 results)  Recent Labs  10/14/15 0900  BNP 814.8*    ProBNP (last 3 results) No results for input(s): PROBNP in the last 8760 hours.    Other results:  Imaging: Ct Abdomen Pelvis Wo Contrast  10/14/2015  CLINICAL DATA:  Patient with generalized weakness and abdominal distension. EXAM: CT ABDOMEN AND PELVIS WITHOUT CONTRAST TECHNIQUE: Multidetector CT imaging of the abdomen and pelvis was performed following the standard protocol without IV contrast. COMPARISON:  Ultrasound abdomen 07/11/2011 FINDINGS: Lower chest: Cardiomegaly. Dependent atelectasis within the lung bases bilaterally. No pleural  effusion. Hepatobiliary: Liver is diffusely low in attenuation compatible with steatosis. Layering high attenuation material within the gallbladder lumen. No gallbladder wall thickening. No intrahepatic or extrahepatic biliary ductal dilatation. Pancreas: Unremarkable Spleen: Unremarkable Adrenals/Urinary Tract: Adrenal glands are normal. Kidneys are symmetric in size. No hydronephrosis. Urinary bladder is unremarkable. Stomach/Bowel: Sigmoid colonic diverticulosis. No CT evidence for acute diverticulitis. The appendix is normal. No evidence for bowel obstruction. No free fluid or free intraperitoneal air. Normal morphology to the stomach.  Vascular/Lymphatic: Normal caliber abdominal aorta. No retroperitoneal lymphadenopathy. Other: Small fat containing bilateral inguinal hernias. Prostate unremarkable. Musculoskeletal: Lumbar spine degenerative changes. No aggressive or acute appearing osseous lesion. Small amount of fluid within umbilicus. Subcutaneous fat stranding involving the pannus. IMPRESSION: Subcutaneous fat stranding involving the pannus. Recommend clinical correlation to exclude the possibility of cellulitis. No acute intra-abdominal process identified. Hepatic steatosis. Cardiomegaly. Layering high attenuation in the gallbladder lumen suggestive of sludge. Electronically Signed   By: Annia Belt M.D.   On: 10/14/2015 14:37     Medications:     Scheduled Medications: . amiodarone  400 mg Oral Daily  . antiseptic oral rinse  7 mL Mouth Rinse q12n4p  . atorvastatin  40 mg Oral Daily  . bisoprolol  5 mg Oral Daily  . chlorhexidine  15 mL Mouth Rinse BID  . clindamycin (CLEOCIN) IV  600 mg Intravenous 4 times per day  . digoxin  0.125 mg Oral Daily  . furosemide  120 mg Intravenous Q12H  . hydrALAZINE  50 mg Oral TID  . insulin aspart  0-20 Units Subcutaneous TID WC  . insulin aspart  0-5 Units Subcutaneous QHS  . insulin detemir  28 Units Subcutaneous QHS  . isosorbide mononitrate  30 mg Oral Daily  . linagliptin  5 mg Oral Daily  . metolazone  5 mg Oral Once  . montelukast  10 mg Oral QHS  . potassium chloride  40 mEq Oral BID  . rivaroxaban  20 mg Oral Q supper  . sodium chloride  3 mL Intravenous Q12H  . sodium chloride  3 mL Intravenous Q12H    Infusions: . sodium chloride    . milrinone 0.25 mcg/kg/min (10/16/15 1011)    PRN Medications: sodium chloride, acetaminophen, albuterol, fluticasone, ondansetron (ZOFRAN) IV, sodium chloride, sodium chloride   Assessment:  1. A/C Systolic/ Diastolic HF 2. A flutter- new onset 3. Acute on CKD Stage IV - due to cardiorenal syndrome 4. Cellulitis 5.  HTN 6. Morbid Obesity  7. Hypokalemia   Plan/Discussion:    SYmptomatically improved on milrinone. Continue milrinone 0.25 mcg. Volume status a little better. Continue IV lasix 120 mg IV twice a day + metolazone 5 mg daily. Continue 5 mg bisoprolol daily, continue hydralazine 50 mg tid, continue imdur 30 mg daily. No spiro for now with CKD. Continue dig 0.125 mg daily. Dig level on admit 0.6. Supplement potassium.   Remains in A flutter. Cardioversion set up for tomorrow at 9 am. Continue Xarelto 20 mg daily.    Continue antibiotics per primary team.   Consult cardiac rehab.    Length of Stay: 2   Amy Clegg NP-C  10/16/2015, 11:55 AM  Advanced Heart Failure Team Pager [phone] (M-F; 7a - 4p)  Please contact [facility] for night-coverage after hours (4p -7a ) and weekends on [email or web address]  Patient seen and examined with Tonye Becket, NP. We discussed all aspects of the encounter. I agree with the assessment and plan as stated above.   Suspect he  had low output HF in setting of low EF and atrial flutter. Much improved with milrinone. Now able to ambulate. Ab pain improving. Renal function improving. Will continue diuresis. Supp K+. Plan cardioversion tomorrow on Xarelto.   Ashawn Rinehart,MD 5:31 PM

## 2015-10-16 NOTE — Progress Notes (Signed)
[facility] PROGRESS NOTE  Gary Hutchinson ZOX:[id number] DOB: August 05, 1961 DOA: 10/14/2015 PCP: [name], MD Brief History: 55 year old male patient with known chronic combined systolic and diastolic heart failure, morbid obesity, ischemic cardiomyopathy, ICD placement leading of history of ventricular tachycardia, diabetes on insulin, hypertension, iron deficiency anemia, atrial fibrillation on amiodarone and anticoagulation, gout, sleep apnea and pulmonary hypertension presents with generalized weakness, cough, nausea, vomiting. He ws admitted for fluid overload and cardiology consulted.   Assessment/Plan: Acute on chronic combined systolic and diastolic congestive heart failure, pulmonary hypertension: Admitted to step down and started on IV lasix 120 mg BID, milrinone gtt started  With appropriate diuresis., cardiology consulted and recommendations given.  He has diursed about 2 liters since yesterday. Reports feeling better. Resume dig and the rest of the home meds except for spironolactone.  Strict intake and output.  Daily weights.  Echocardiogram ordered showed LVEF moderately depressed at 40%, left ventricle s severely dilated, wall thickness is normal.  PA pressures are 67 mm hg, and right ventricle systolic function was moderately to severely reduced. Moderate MR regurgitation. Elevated bnp on admission. .    Acute renal failure on CKD stage 3: Improving.    OSA:  On BIPAP at night.    Hypertension: Well controlled.    Nausea: Better today.   Atrial fibrillation: - on amiodarone.  Resume xarelto, plan for cardioversion in am.   Panniculitis: - on clindamycin.   Diabetes mellitus: CBG (last 3)   Recent Labs  10/15/15 2128 10/16/15 0918 10/16/15 1234  GLUCAP 163* 83 177*    Resume SSI.   HGBA1C IS 8.9 Resume levemir 28 units daily.   Code Status: full code.  Family Communication: none at bedside Disposition Plan: pending further work up , will  probably need PT eval after that.    Consultants:  Cardiology.   Procedures:  none  Antibiotics:  Clindamycin.   HPI/Subjective: Wants to eat.REPORTS feeling better.   Objective: Filed Vitals:   10/16/15 1013 10/16/15 1200  BP: 117/63 131/69  Pulse: 62 69  Temp:  97.7 F (36.5 C)  Resp:  24    Intake/Output Summary (Last 24 hours) at 10/16/15 1348 Last data filed at 10/16/15 1124  Gross per 24 hour  Intake 1187.22 ml  Output   1600 ml  Net -412.78 ml   Filed Weights   10/14/15 2108 10/15/15 0400 10/16/15 0500  Weight: 141 kg (310 lb 13.6 oz) 141.2 kg (311 lb 4.6 oz) 140.4 kg (309 lb 8.4 oz)    Exam:   General:  Alert afebrile comfortable.   Cardiovascular: s1s2  Respiratory: ctab, no wheezing, diminished air entry at  Bases.  Abdomen: soft NT nd bs+  Musculoskeletal: pedal edema.   Data Reviewed: Basic Metabolic Panel:  Recent Labs Lab 10/11/15 1617 10/14/15 0900 10/15/15 0348 10/16/15 0239  NA 139 137 140 140  K 3.4* 3.3* 3.9 3.4*  CL 99* 97* 100* 98*  CO2 27 25 30 31   GLUCOSE 159* 261* 179* 121*  BUN 55* 61* 57* 51*  CREATININE 2.10* 2.04* 2.06* 1.93*  CALCIUM 9.7 9.1 9.4 9.2   Liver Function Tests:  Recent Labs Lab 10/14/15 0900  AST 34  ALT 31  ALKPHOS 108  BILITOT 1.7*  PROT 6.9  ALBUMIN 3.3*    Recent Labs Lab 10/14/15 0900  LIPASE 35   No results for input(s): AMMONIA in the last 168 hours. CBC:  Recent Labs Lab 10/14/15 0900 10/16/15 0239  WBC 5.8  5.0  NEUTROABS  --  3.5  HGB 11.6* 12.3*  HCT 37.3* 39.9  MCV 86.1 88.5  PLT 152 171   Cardiac Enzymes: No results for input(s): CKTOTAL, CKMB, CKMBINDEX, TROPONINI in the last 168 hours. BNP (last 3 results)  Recent Labs  10/14/15 0900  BNP 814.8*    ProBNP (last 3 results) No results for input(s): PROBNP in the last 8760 hours.  CBG:  Recent Labs Lab 10/15/15 1355 10/15/15 1642 10/15/15 2128 10/16/15 0918 10/16/15 1234  GLUCAP 119* 162*  163* 83 177*    Recent Results (from the past 240 hour(s))  MRSA PCR Screening     Status: None   Collection Time: 10/14/15  9:06 PM  Result Value Ref Range Status   MRSA by PCR NEGATIVE NEGATIVE Final    Comment:        The GeneXpert MRSA Assay (FDA approved for NASAL specimens only), is one component of a comprehensive MRSA colonization surveillance program. It is not intended to diagnose MRSA infection nor to guide or monitor treatment for MRSA infections.      Studies: Ct Abdomen Pelvis Wo Contrast  10/14/2015  CLINICAL DATA:  Patient with generalized weakness and abdominal distension. EXAM: CT ABDOMEN AND PELVIS WITHOUT CONTRAST TECHNIQUE: Multidetector CT imaging of the abdomen and pelvis was performed following the standard protocol without IV contrast. COMPARISON:  Ultrasound abdomen 07/11/2011 FINDINGS: Lower chest: Cardiomegaly. Dependent atelectasis within the lung bases bilaterally. No pleural effusion. Hepatobiliary: Liver is diffusely low in attenuation compatible with steatosis. Layering high attenuation material within the gallbladder lumen. No gallbladder wall thickening. No intrahepatic or extrahepatic biliary ductal dilatation. Pancreas: Unremarkable Spleen: Unremarkable Adrenals/Urinary Tract: Adrenal glands are normal. Kidneys are symmetric in size. No hydronephrosis. Urinary bladder is unremarkable. Stomach/Bowel: Sigmoid colonic diverticulosis. No CT evidence for acute diverticulitis. The appendix is normal. No evidence for bowel obstruction. No free fluid or free intraperitoneal air. Normal morphology to the stomach. Vascular/Lymphatic: Normal caliber abdominal aorta. No retroperitoneal lymphadenopathy. Other: Small fat containing bilateral inguinal hernias. Prostate unremarkable. Musculoskeletal: Lumbar spine degenerative changes. No aggressive or acute appearing osseous lesion. Small amount of fluid within umbilicus. Subcutaneous fat stranding involving the pannus.  IMPRESSION: Subcutaneous fat stranding involving the pannus. Recommend clinical correlation to exclude the possibility of cellulitis. No acute intra-abdominal process identified. Hepatic steatosis. Cardiomegaly. Layering high attenuation in the gallbladder lumen suggestive of sludge. Electronically Signed   By: Annia Belt M.D.   On: 10/14/2015 14:37    Scheduled Meds: . amiodarone  400 mg Oral Daily  . antiseptic oral rinse  7 mL Mouth Rinse q12n4p  . atorvastatin  40 mg Oral Daily  . bisoprolol  5 mg Oral Daily  . chlorhexidine  15 mL Mouth Rinse BID  . clindamycin (CLEOCIN) IV  600 mg Intravenous 4 times per day  . digoxin  0.125 mg Oral Daily  . furosemide  120 mg Intravenous Q12H  . hydrALAZINE  50 mg Oral TID  . insulin aspart  0-20 Units Subcutaneous TID WC  . insulin aspart  0-5 Units Subcutaneous QHS  . insulin detemir  28 Units Subcutaneous QHS  . isosorbide mononitrate  30 mg Oral Daily  . linagliptin  5 mg Oral Daily  . metolazone  5 mg Oral Once  . metolazone  5 mg Oral Daily  . montelukast  10 mg Oral QHS  . potassium chloride  40 mEq Oral BID  . rivaroxaban  20 mg Oral Q supper  . sodium chloride  3 mL Intravenous Q12H  . sodium chloride  3 mL Intravenous Q12H   Continuous Infusions: . sodium chloride    . milrinone 0.25 mcg/kg/min (10/16/15 1011)    Principal Problem:   Acute on chronic combined systolic and diastolic congestive heart failure, NYHA class 2 (HCC) Active Problems:   Insulin dependent type 2 diabetes mellitus, uncontrolled (HCC)   Morbid obesity (HCC)   ANEMIA-IRON DEFICIENCY   Obstructive sleep apnea   HTN (hypertension)   Pulmonary HTN (HCC)   Atrial fibrillation (HCC)   Acute renal failure superimposed on stage 3 chronic kidney disease (HCC)   Acute panniculitis   Acute on chronic systolic heart failure, NYHA class 2 (HCC)    Time spent: 25 minutes.     [facility]  [facility] Pager [phone]. If 7PM-7AM, please contact  night-coverage at [email or web address], password [facility] 10/16/2015, 1:48 PM  LOS: 2 days

## 2015-10-16 NOTE — Progress Notes (Signed)
Pt is complaining of bloating and constipation. He says he, "feels full and nothing will pass through". He states that he is incredibly uncomfortable. Prune juice and Coffee did not resolve issue. Craige Cotta, NP notified. Mirilax and Senna was ordered. Pt said he would rather have, "something immediate, like a suppository". Dulcolax suppository and colace was ordered and administered. Will continue to monitor.

## 2015-10-17 ENCOUNTER — Encounter (HOSPITAL_COMMUNITY): Payer: Self-pay

## 2015-10-17 ENCOUNTER — Inpatient Hospital Stay (HOSPITAL_COMMUNITY): Payer: Medicare Other | Admitting: Anesthesiology

## 2015-10-17 ENCOUNTER — Encounter (HOSPITAL_COMMUNITY)
Admission: EM | Disposition: A | Discharge status: Home / Self Care | Payer: Self-pay | Source: Home / Self Care | Attending: Internal Medicine

## 2015-10-17 DIAGNOSIS — I4892 Unspecified atrial flutter: Secondary | ICD-10-CM

## 2015-10-17 DIAGNOSIS — I483 Typical atrial flutter: Secondary | ICD-10-CM

## 2015-10-17 HISTORY — PX: CARDIOVERSION: SHX1299

## 2015-10-17 LAB — BASIC METABOLIC PANEL
Anion gap: 13 (ref 5–15)
BUN: 40 mg/dL — ABNORMAL HIGH (ref 6–20)
CALCIUM: 9 mg/dL (ref 8.9–10.3)
CO2: 27 mmol/L (ref 22–32)
CREATININE: 1.78 mg/dL — AB (ref 0.61–1.24)
Chloride: 102 mmol/L (ref 101–111)
GFR, EST AFRICAN AMERICAN: 48 mL/min — AB (ref >60)
GFR, EST NON AFRICAN AMERICAN: 42 mL/min — AB (ref >60)
Glucose, Bld: 144 mg/dL — ABNORMAL HIGH (ref 65–99)
Potassium: 3.8 mmol/L (ref 3.5–5.1)
SODIUM: 142 mmol/L (ref 135–145)

## 2015-10-17 LAB — GLUCOSE, CAPILLARY
GLUCOSE-CAPILLARY: 141 mg/dL — AB (ref 65–99)
Glucose-Capillary: 114 mg/dL — ABNORMAL HIGH (ref 65–99)
Glucose-Capillary: 132 mg/dL — ABNORMAL HIGH (ref 65–99)
Glucose-Capillary: 183 mg/dL — ABNORMAL HIGH (ref 65–99)

## 2015-10-17 SURGERY — CARDIOVERSION
Anesthesia: Monitor Anesthesia Care

## 2015-10-17 MED ORDER — LIDOCAINE HCL (CARDIAC) 20 MG/ML IV SOLN
INTRAVENOUS | Status: DC | PRN
  Administered 2015-10-17: 40 mg via INTRATRACHEAL

## 2015-10-17 MED ORDER — MOMETASONE FURO-FORMOTEROL FUM 100-5 MCG/ACT IN AERO
2.0000 | INHALATION_SPRAY | Freq: Two times a day (BID) | RESPIRATORY_TRACT | Status: DC
  Filled 2015-10-17 (×2): qty 8.8

## 2015-10-17 MED ORDER — PROPOFOL 10 MG/ML IV BOLUS
INTRAVENOUS | Status: DC | PRN
  Administered 2015-10-17: 50 mg via INTRAVENOUS

## 2015-10-17 NOTE — Progress Notes (Signed)
Advanced Heart Failure Rounding Note   Subjective:    Admitted with dyspnea,volume overload, and cellitis. EKG with A flutter. Diuresed with 120 mg IV lasix twice daily + metolazone.  Started on antibiotics per primary team.  He continues on milrinone 0.25 mcg. S/P DC-CV now in NSR. ut. Weight unchanged.      Overall feeling better today. Denies SOB.     Objective:   Weight Range:  Vital Signs:   Temp:  [97.5 F (36.4 C)-98 F (36.7 C)] 97.6 F (36.4 C) (01/11 1156) Pulse Rate:  [56-102] 102 (01/11 1156) Resp:  [15-24] 24 (01/11 1156) BP: (102-146)/(43-112) 102/54 mmHg (01/11 1156) SpO2:  [91 %-99 %] 95 % (01/11 1156) Weight:  [308 lb (139.708 kg)-308 lb 3.3 oz (139.8 kg)] 308 lb (139.708 kg) (01/11 0846) Last BM Date: 10/17/15  Weight change: Filed Weights   10/16/15 0500 10/17/15 0444 10/17/15 0846  Weight: 309 lb 8.4 oz (140.4 kg) 308 lb 3.3 oz (139.8 kg) 308 lb (139.708 kg)    Intake/Output:   Intake/Output Summary (Last 24 hours) at 10/17/15 1435 Last data filed at 10/17/15 1300  Gross per 24 hour  Intake 1195.2 ml  Output   1875 ml  Net -679.8 ml     Physical Exam: General:  No resp difficulty. Sitting on the side of the bed.  HEENT: normal Neck: supple. JVP ~10 difficult to assess. Carotids 2+ bilat; no bruits. No lymphadenopathy or thryomegaly appreciated. Cor: PMI nonpalpable. Regular rate & rhythm. No rubs, gallops or murmurs. Lungs: clear Abdomen: soft, nontender, distended. No hepatosplenomegaly. No bruits or masses. Good bowel sounds. Extremities: no cyanosis, clubbing, rash, R and LLE 1+ edema. Ted hose in place.   Neuro: alert & orientedx3, cranial nerves grossly intact. moves all 4 extremities w/o difficulty. Affect pleasant  Telemetry:  NSR    Labs: Basic Metabolic Panel:  Recent Labs Lab 10/11/15 1617 10/14/15 0900 10/15/15 0348 10/16/15 0239 10/17/15 0231  NA 139 137 140 140 142  K 3.4* 3.3* 3.9 3.4* 3.8  CL 99* 97* 100* 98*  102  CO2 GLUCOSE 159* 261* 179* 121* 144*  BUN 55* 61* 57* 51* 40*  CREATININE 2.10* 2.04* 2.06* 1.93* 1.78*  CALCIUM 9.7 9.1 9.4 9.2 9.0    Liver Function Tests:  Recent Labs Lab 10/14/15 0900  AST 34  ALT 31  ALKPHOS 108  BILITOT 1.7*  PROT 6.9  ALBUMIN 3.3*    Recent Labs Lab 10/14/15 0900  LIPASE 35   No results for input(s): AMMONIA in the last 168 hours.  CBC:  Recent Labs Lab 10/14/15 0900 10/16/15 0239  WBC 5.8 5.0  NEUTROABS  --  3.5  HGB 11.6* 12.3*  HCT 37.3* 39.9  MCV 86.1 88.5  PLT 152 171    Cardiac Enzymes: No results for input(s): CKTOTAL, CKMB, CKMBINDEX, TROPONINI in the last 168 hours.  BNP: BNP (last 3 results)  Recent Labs  10/14/15 0900  BNP 814.8*    ProBNP (last 3 results) No results for input(s): PROBNP in the last 8760 hours.    Other results:  Imaging: No results found.   Medications:     Scheduled Medications: . amiodarone  400 mg Oral Daily  . antiseptic oral rinse  7 mL Mouth Rinse q12n4p  . atorvastatin  40 mg Oral Daily  . bisoprolol  5 mg Oral Daily  . chlorhexidine  15 mL Mouth Rinse BID  . clindamycin (CLEOCIN) IV  600 mg Intravenous 4 times per day  . digoxin  0.125 mg Oral Daily  . docusate sodium  200 mg Oral QHS  . furosemide  120 mg Intravenous Q12H  . hydrALAZINE  50 mg Oral TID  . insulin aspart  0-20 Units Subcutaneous TID WC  . insulin aspart  0-5 Units Subcutaneous QHS  . insulin detemir  28 Units Subcutaneous QHS  . isosorbide mononitrate  30 mg Oral Daily  . linagliptin  5 mg Oral Daily  . metolazone  5 mg Oral Daily  . mometasone-formoterol  2 puff Inhalation BID  . montelukast  10 mg Oral QHS  . potassium chloride  40 mEq Oral BID  . rivaroxaban  20 mg Oral Q supper  . sodium chloride  3 mL Intravenous Q12H  . sodium chloride  3 mL Intravenous Q12H    Infusions: . sodium chloride    . milrinone 0.25 mcg/kg/min (10/17/15 1406)    PRN Medications: sodium  chloride, acetaminophen, albuterol, bisacodyl, fluticasone, ondansetron (ZOFRAN) IV, polyethylene glycol, sodium chloride, sodium chloride   Assessment:  1. A/C Systolic/ Diastolic HF 2. A flutter- new onset 3. Acute on CKD Stage IV - due to cardiorenal syndrome 4. Cellulitis 5. HTN 6. Morbid Obesity  7. Hypokalemia   Plan/Discussion:    Much improved. S/P DC-CV Now in NSR. Volume status improving. Continue IV lasix + metolazone. Cut back milrinone to 0.125 mcg. Anticipate stopping tomorrow. Continue 5 mg bisoprolol daily, continue hydralazine 50 mg tid, continue imdur 30 mg daily. No spiro for now with CKD. Continue dig 0.125 mg daily. Dig level on admit 0.6.     Continue Xarelto 20 mg daily.    Continue antibiotics per primary team.   Consult cardiac rehab.    Length of Stay: 3   Amy Clegg NP-C  10/17/2015, 2:35 PM  Advanced Heart Failure Team Pager [phone] (M-F; 7a - 4p)  Please contact [facility] for night-coverage after hours (4p -7a ) and weekends on [email or web address]   Patient seen and examined with Tonye Becket, NP. We discussed all aspects of the encounter. I agree with the assessment and plan as stated above.   Much improved with IV milrinone and Iv lasix. Volume status better. Renal function improved. Also underwent successful DC-CV of AFL today. Cellulitis improving with abx.  Will cut milrinone back to 0.125. Hopefully can turn milrinone off tomorrow. Possibly home Friday or Saturday if he continues to improve. Continue Xarelto.  Taneya Conkel,MD 4:45 PM

## 2015-10-17 NOTE — Progress Notes (Signed)
PATIENT DETAILS Name: Gary Hutchinson Age: 55 y.o. Sex: male Date of Birth: 02-Jun-1961 Admit Date: 10/14/2015 Admitting Physician Ozella Rocks, MD ZOX:[id number],VWUJ M, MD  Subjective: Feels better. No shortness of breath unless edema.  Assessment/Plan: Principal Problem: Acute on chronic combined systolic and diastolic congestive heart failure: Improving, continue milrinone infusion, digoxin and intravenous Lasix along 5 mg daily of metolazone. Weight decreased to 308 pounds (313 pounds on admission), negative balance of 1.6 L so far. Await further recommendations from cardiology.   Active Problems: Acute on chronic kidney disease stage 3: ARF likely secondary to cardiorenal syndrome. Improving with milrinone infusion. Follow electrolytes.  Atrial flutter: Underwent  DC cardioversion 10/17/15, continue amiodarone, bisoprolol and Xarelto.  Panniculitis: Continue clindamycin, afebrile, improving.  Hypertension: Controlled, continue bisoprolol, hydralazine, and Imdur. Follow and adjust accordingly  Insulin dependent type 2 diabetes mellitus, uncontrolled: CBG stable, continue 28 units of Lantus and SSI. Follow and adjust accordingly  Dyslipidemia: Continue statin  History of ventricular tachycardia: Status post ICD implantation.  Pulmonary HTN : Likely related to CHF/obesity. Deferred to cardiology.  Obstructive sleep apnea: Continue CPAP  Disposition: Remain inpatient-remain in SDU  Antimicrobial agents  See below  Anti-infectives    Start     Dose/Rate Route Frequency Ordered Stop   10/15/15 0000  clindamycin (CLEOCIN) IVPB 600 mg     600 mg 100 mL/hr over 30 Minutes Intravenous 4 times per day 10/14/15 2107     10/14/15 1530  clindamycin (CLEOCIN) IVPB 600 mg  Status:  Discontinued     600 mg 100 mL/hr over 30 Minutes Intravenous  Once 10/14/15 1521 10/14/15 1524   10/14/15 1530  clindamycin (CLEOCIN) capsule 450 mg     450 mg Oral  Once 10/14/15 1525  10/14/15 1822   10/14/15 0000  clindamycin (CLEOCIN) 150 MG capsule     450 mg Oral 4 times daily 10/14/15 1526        DVT Prophylaxis: Xarelto  Code Status: Full code  Family Communication None at bedside  Procedures: DC Cardioversion 1/11  CONSULTS:  cardiology  Time spent 30 minutes-Greater than 50% of this time was spent in counseling, explanation of diagnosis, planning of further management, and coordination of care.  MEDICATIONS: Scheduled Meds: . amiodarone  400 mg Oral Daily  . antiseptic oral rinse  7 mL Mouth Rinse q12n4p  . atorvastatin  40 mg Oral Daily  . bisoprolol  5 mg Oral Daily  . chlorhexidine  15 mL Mouth Rinse BID  . clindamycin (CLEOCIN) IV  600 mg Intravenous 4 times per day  . digoxin  0.125 mg Oral Daily  . docusate sodium  200 mg Oral QHS  . furosemide  120 mg Intravenous Q12H  . hydrALAZINE  50 mg Oral TID  . insulin aspart  0-20 Units Subcutaneous TID WC  . insulin aspart  0-5 Units Subcutaneous QHS  . insulin detemir  28 Units Subcutaneous QHS  . isosorbide mononitrate  30 mg Oral Daily  . linagliptin  5 mg Oral Daily  . metolazone  5 mg Oral Daily  . mometasone-formoterol  2 puff Inhalation BID  . montelukast  10 mg Oral QHS  . potassium chloride  40 mEq Oral BID  . rivaroxaban  20 mg Oral Q supper  . sodium chloride  3 mL Intravenous Q12H  . sodium chloride  3 mL Intravenous Q12H   Continuous Infusions: . sodium chloride    .  milrinone 0.25 mcg/kg/min (10/17/15 0531)   PRN Meds:.sodium chloride, acetaminophen, albuterol, bisacodyl, fluticasone, ondansetron (ZOFRAN) IV, polyethylene glycol, sodium chloride, sodium chloride    PHYSICAL EXAM: Vital signs in last 24 hours: Filed Vitals:   10/17/15 1000 10/17/15 1010 10/17/15 1057 10/17/15 1059  BP: 132/64 146/79  130/67  Pulse: 56 62 67   Temp: 97.7 F (36.5 C)     TempSrc: Oral     Resp: 23 23    Height:      Weight:      SpO2: 99% 95%      Weight change: -0.6 kg (-1  lb 5.2 oz) Filed Weights   10/16/15 0500 10/17/15 0444 10/17/15 0846  Weight: 140.4 kg (309 lb 8.4 oz) 139.8 kg (308 lb 3.3 oz) 139.708 kg (308 lb)   Body mass index is 51.25 kg/(m^2).   Gen Exam: Awake and alert with clear speech.   Neck: Supple, No JVD.   Chest: B/L Clear.   CVS: S1 S2 Regular Abdomen: soft, BS +, non tender, non distended.  Extremities: + edema, lower extremities warm to touch. Neurologic: Non Focal.   Skin: No Rash.   Wounds: N/A.   Intake/Output from previous day:  Intake/Output Summary (Last 24 hours) at 10/17/15 1102 Last data filed at 10/17/15 0953  Gross per 24 hour  Intake 1079.8 ml  Output   1800 ml  Net -720.2 ml     LAB RESULTS: CBC  Recent Labs Lab 10/14/15 0900 10/16/15 0239  WBC 5.8 5.0  HGB 11.6* 12.3*  HCT 37.3* 39.9  PLT 152 171  MCV 86.1 88.5  MCH 26.8 27.3  MCHC 31.1 30.8  RDW 18.5* 19.4*  LYMPHSABS  --  0.9  MONOABS  --  0.5  EOSABS  --  0.1  BASOSABS  --  0.0    Chemistries   Recent Labs Lab 10/11/15 1617 10/14/15 0900 10/15/15 0348 10/16/15 0239 10/17/15 0231  NA 139 137 140 140 142  K 3.4* 3.3* 3.9 3.4* 3.8  CL 99* 97* 100* 98* 102  CO2 27 25 30 31 27   GLUCOSE 159* 261* 179* 121* 144*  BUN 55* 61* 57* 51* 40*  CREATININE 2.10* 2.04* 2.06* 1.93* 1.78*  CALCIUM 9.7 9.1 9.4 9.2 9.0    CBG:  Recent Labs Lab 10/16/15 0918 10/16/15 1234 10/16/15 1621 10/16/15 2228 10/17/15 0826  GLUCAP 83 177* 176* 144* 114*    GFR Estimated Creatinine Clearance: 62.3 mL/min (by C-G formula based on Cr of 1.78).  Coagulation profile No results for input(s): INR, PROTIME in the last 168 hours.  Cardiac Enzymes No results for input(s): CKMB, TROPONINI, MYOGLOBIN in the last 168 hours.  Invalid input(s): CK  Invalid input(s): POCBNP No results for input(s): DDIMER in the last 72 hours.  Recent Labs  10/14/15 2030  HGBA1C 8.9*   No results for input(s): CHOL, HDL, LDLCALC, TRIG, CHOLHDL, LDLDIRECT in the  last 72 hours. No results for input(s): TSH, T4TOTAL, T3FREE, THYROIDAB in the last 72 hours.  Invalid input(s): FREET3 No results for input(s): VITAMINB12, FOLATE, FERRITIN, TIBC, IRON, RETICCTPCT in the last 72 hours. No results for input(s): LIPASE, AMYLASE in the last 72 hours.  Urine Studies No results for input(s): UHGB, CRYS in the last 72 hours.  Invalid input(s): UACOL, UAPR, USPG, UPH, UTP, UGL, UKET, UBIL, UNIT, UROB, ULEU, UEPI, UWBC, URBC, UBAC, CAST, UCOM, BILUA  MICROBIOLOGY: Recent Results (from the past 240 hour(s))  MRSA PCR Screening     Status: None  Collection Time: 10/14/15  9:06 PM  Result Value Ref Range Status   MRSA by PCR NEGATIVE NEGATIVE Final    Comment:        The GeneXpert MRSA Assay (FDA approved for NASAL specimens only), is one component of a comprehensive MRSA colonization surveillance program. It is not intended to diagnose MRSA infection nor to guide or monitor treatment for MRSA infections.     RADIOLOGY STUDIES/RESULTS: Ct Abdomen Pelvis Wo Contrast  10/14/2015  CLINICAL DATA:  Patient with generalized weakness and abdominal distension. EXAM: CT ABDOMEN AND PELVIS WITHOUT CONTRAST TECHNIQUE: Multidetector CT imaging of the abdomen and pelvis was performed following the standard protocol without IV contrast. COMPARISON:  Ultrasound abdomen 07/11/2011 FINDINGS: Lower chest: Cardiomegaly. Dependent atelectasis within the lung bases bilaterally. No pleural effusion. Hepatobiliary: Liver is diffusely low in attenuation compatible with steatosis. Layering high attenuation material within the gallbladder lumen. No gallbladder wall thickening. No intrahepatic or extrahepatic biliary ductal dilatation. Pancreas: Unremarkable Spleen: Unremarkable Adrenals/Urinary Tract: Adrenal glands are normal. Kidneys are symmetric in size. No hydronephrosis. Urinary bladder is unremarkable. Stomach/Bowel: Sigmoid colonic diverticulosis. No CT evidence for acute  diverticulitis. The appendix is normal. No evidence for bowel obstruction. No free fluid or free intraperitoneal air. Normal morphology to the stomach. Vascular/Lymphatic: Normal caliber abdominal aorta. No retroperitoneal lymphadenopathy. Other: Small fat containing bilateral inguinal hernias. Prostate unremarkable. Musculoskeletal: Lumbar spine degenerative changes. No aggressive or acute appearing osseous lesion. Small amount of fluid within umbilicus. Subcutaneous fat stranding involving the pannus. IMPRESSION: Subcutaneous fat stranding involving the pannus. Recommend clinical correlation to exclude the possibility of cellulitis. No acute intra-abdominal process identified. Hepatic steatosis. Cardiomegaly. Layering high attenuation in the gallbladder lumen suggestive of sludge. Electronically Signed   By: Annia Belt M.D.   On: 10/14/2015 14:37   Dg Chest 2 View  10/14/2015  CLINICAL DATA:  55 year old male with weakness, abdominal pain and nausea EXAM: CHEST  2 VIEW COMPARISON:  Prior chest x-ray 03/07/2014 FINDINGS: Stable position of left subclavian approach intracardiac defibrillator. There is marked enlargement of the cardiopericardial silhouette which is similar compared to prior. Background pulmonary vascular congestion without definitive edema. Limited lateral chest x-ray secondary to patient body habitus. No pleural effusion, pneumothorax or definite focal airspace consolidation. IMPRESSION: 1. Stable marked enlargement of the cardiopericardial silhouette compared to prior imaging which may reflect cardiomegaly and/or pericardial effusion. 2. Stable position of intracardiac defibrillator. 3. Pulmonary vascular congestion without overt edema. Electronically Signed   By: Malachy Moan M.D.   On: 10/14/2015 09:59    Jeoffrey Massed, MD  [facility] Pager:[phone]  If 7PM-7AM, please contact night-coverage [email or web address] Password [email or web address] 10/17/2015, 11:02 AM   LOS: 3 days

## 2015-10-17 NOTE — CV Procedure (Signed)
    Cardioversion Note  NIELS JAIME [id number] [birth year]  Procedure: DC Cardioversion Indications: atrial flutter   Procedure Details Consent: Obtained Time Out: Verified patient identification, verified procedure, site/side was marked, verified correct patient position, special equipment/implants available, Radiology Safety Procedures followed,  medications/allergies/relevent history reviewed, required imaging and test results available.  Performed  The patient has been on adequate anticoagulation.  The patient received Lidocaine 40 mg IV followed by Propofol 50 mg  for sedation.  Synchronous cardioversion was performed at 50 , then 200  joules.  The cardioversion was successful     Complications: No apparent complications Patient did tolerate procedure well.   [name], Montez Hageman., MD, [facility] 10/17/2015, 9:52 AM

## 2015-10-17 NOTE — Progress Notes (Signed)
CARDIAC REHAB PHASE I   PRE:  Rate/Rhythm: 73 SR    BP: sitting 121/59    SaO2: 90 RA  MODE:  Ambulation: 240 ft   POST:  Rate/Rhythm: ? 126 with PVC/PACs (junky on monitor)    BP: sitting 113/69     SaO2: 90 RA  Pt eager to walk on my arrival. Fairly steady but fatigued easily. C/o weak legs. Monitor difficult to read while he was up, probable PVCs and PACs. Resolved with rest. To recliner. Will f/u tomorrow. [id number]   Elissa Lovett [place] CES, ACSM 10/17/2015 3:20 PM

## 2015-10-17 NOTE — Anesthesia Preprocedure Evaluation (Addendum)
Anesthesia Evaluation  Patient identified by MRN, date of birth, ID band Patient awake    Reviewed: Allergy & Precautions, NPO status , Patient's Chart, lab work & pertinent test results  Airway Mallampati: II       Dental  (+) Dental Advisory Given, Partial Upper   Pulmonary sleep apnea ,     + decreased breath sounds      Cardiovascular hypertension, +CHF  (-) CAD + dysrhythmias Atrial Fibrillation  Rhythm:Irregular Rate:Normal  - Left ventricle: Poor acoustic windows limit study, even with Definity. Ovreall LVEF appears moderately depressed at approximately 40%. In comparison to images from 2015 this is a little better. The cavity size was severely dilated. Wall thickness was normal. - Mitral valve: There was moderate regurgitation. - Left atrium: The atrium was moderately dilated. - Right ventricle: The cavity size was mildly dilated. Systolic function was moderately to severely reduced. - Right atrium: The atrium was moderately dilated. - Pulmonary arteries: PA peak pressure: 67 mm Hg (S).   Neuro/Psych    GI/Hepatic   Endo/Other  diabetesMorbid obesity  Renal/GU Renal disease     Musculoskeletal   Abdominal   Peds  Hematology  (+) anemia ,   Anesthesia Other Findings   Reproductive/Obstetrics                          Anesthesia Physical Anesthesia Plan  ASA: III  Anesthesia Plan: MAC   Post-op Pain Management:    Induction: Intravenous  Airway Management Planned: Mask  Additional Equipment:   Intra-op Plan:   Post-operative Plan:   Informed Consent: I have reviewed the patients History and Physical, chart, labs and discussed the procedure including the risks, benefits and alternatives for the proposed anesthesia with the patient or authorized representative who has indicated his/her understanding and acceptance.     Plan Discussed with: Anesthesiologist and  CRNA  Anesthesia Plan Comments:         Anesthesia Quick Evaluation

## 2015-10-17 NOTE — Transfer of Care (Signed)
Immediate Anesthesia Transfer of Care Note  Patient: Gary Hutchinson  Procedure(s) Performed: Procedure(s): CARDIOVERSION (N/A)  Patient Location: Endoscopy Unit  Anesthesia Type:MAC  Level of Consciousness: awake, alert  and oriented  Airway & Oxygen Therapy: Patient Spontanous Breathing and Patient connected to nasal cannula oxygen  Post-op Assessment: Report given to RN, Post -op Vital signs reviewed and stable and Patient moving all extremities  Post vital signs: Reviewed and stable  Last Vitals:  Filed Vitals:   10/17/15 0836 10/17/15 0846  BP: 105/43 135/112  Pulse: 69 71  Temp: 36.4 C 36.5 C  Resp: 22 18    Complications: No apparent anesthesia complications

## 2015-10-17 NOTE — Anesthesia Postprocedure Evaluation (Signed)
Anesthesia Post Note  Patient: Gary Hutchinson  Procedure(s) Performed: Procedure(s) (LRB): CARDIOVERSION (N/A)  Patient location during evaluation: PACU Anesthesia Type: MAC Level of consciousness: awake and alert Pain management: pain level controlled Vital Signs Assessment: post-procedure vital signs reviewed and stable Respiratory status: spontaneous breathing, nonlabored ventilation, respiratory function stable and patient connected to nasal cannula oxygen Cardiovascular status: stable and blood pressure returned to baseline Anesthetic complications: no    Last Vitals:  Filed Vitals:   10/17/15 1100 10/17/15 1156  BP: 130/67 102/54  Pulse: 63 102  Temp:  36.4 C  Resp: 16 24    Last Pain:  Filed Vitals:   10/17/15 1158  PainSc: 0-No pain                 Reino Kent

## 2015-10-18 ENCOUNTER — Encounter (HOSPITAL_COMMUNITY): Payer: Self-pay | Admitting: Cardiovascular Disease

## 2015-10-18 DIAGNOSIS — I10 Essential (primary) hypertension: Secondary | ICD-10-CM

## 2015-10-18 LAB — GLUCOSE, CAPILLARY
GLUCOSE-CAPILLARY: 176 mg/dL — AB (ref 65–99)
GLUCOSE-CAPILLARY: 151 mg/dL — AB (ref 65–99)
GLUCOSE-CAPILLARY: 146 mg/dL — AB (ref 65–99)
Glucose-Capillary: 173 mg/dL — ABNORMAL HIGH (ref 65–99)

## 2015-10-18 LAB — BASIC METABOLIC PANEL
ANION GAP: 9 (ref 5–15)
BUN: 37 mg/dL — ABNORMAL HIGH (ref 6–20)
CHLORIDE: 100 mmol/L — AB (ref 101–111)
CO2: 29 mmol/L (ref 22–32)
Calcium: 9.4 mg/dL (ref 8.9–10.3)
Creatinine, Ser: 1.85 mg/dL — ABNORMAL HIGH (ref 0.61–1.24)
GFR calc Af Amer: 46 mL/min — ABNORMAL LOW (ref >60)
GFR, EST NON AFRICAN AMERICAN: 40 mL/min — AB (ref >60)
GLUCOSE: 168 mg/dL — AB (ref 65–99)
POTASSIUM: 3.9 mmol/L (ref 3.5–5.1)
Sodium: 138 mmol/L (ref 135–145)

## 2015-10-18 MED ORDER — METOLAZONE 5 MG PO TABS
5.0000 mg | ORAL_TABLET | Freq: Every day | ORAL | Status: DC
  Administered 2015-10-18: 5 mg via ORAL
  Filled 2015-10-18: qty 1

## 2015-10-18 MED ORDER — TORSEMIDE 20 MG PO TABS
40.0000 mg | ORAL_TABLET | Freq: Two times a day (BID) | ORAL | Status: DC
  Administered 2015-10-18 – 2015-10-19 (×2): 40 mg via ORAL
  Filled 2015-10-18 (×3): qty 2

## 2015-10-18 NOTE — Progress Notes (Signed)
Patient has nauseous since 0000. Nausea medication was offered, but patient refused.

## 2015-10-18 NOTE — Progress Notes (Signed)
Advanced Heart Failure Rounding Note   Subjective:    Admitted with dyspnea,volume overload, cellulitis and new A flutter. Diuresed with 120 mg IV lasix twice daily + metolazone.  Started on antibiotics per primary team.   S/P DC-CV now in NSR. Yesterday milrinone cut back to 0.125 mcg.    Overall feeling better today. Diuresed well. Denies SOB.     Objective:   Weight Range:  Vital Signs:   Temp:  [97.5 F (36.4 C)-97.8 F (36.6 C)] 97.8 F (36.6 C) (01/12 0401) Pulse Rate:  [62-102] 62 (01/12 0401) Resp:  [16-24] 19 (01/12 0401) BP: (102-146)/(54-80) 126/70 mmHg (01/12 0401) SpO2:  [94 %-95 %] 94 % (01/12 0401) Weight:  [308 lb (139.708 kg)-309 lb 1.4 oz (140.2 kg)] 309 lb 1.4 oz (140.2 kg) (01/12 0500) Last BM Date: 10/17/15  Weight change: Filed Weights   10/17/15 0846 10/17/15 1445 10/18/15 0500  Weight: 308 lb (139.708 kg) 308 lb (139.708 kg) 309 lb 1.4 oz (140.2 kg)    Intake/Output:   Intake/Output Summary (Last 24 hours) at 10/18/15 1004 Last data filed at 10/18/15 0522  Gross per 24 hour  Intake 1351.4 ml  Output   1000 ml  Net  351.4 ml     Physical Exam: General:  No resp difficulty. Sitting on side of bed.   HEENT: normal Neck: supple. JVP does not appear elevated.  Carotids 2+ bilat; no bruits. No lymphadenopathy or thryomegaly appreciated. Cor: PMI nonpalpable. Regular rate & rhythm. No rubs, gallops or murmurs. Lungs: clear Abdomen: obese soft, nontender, distended. NNo bruits or masses. Good bowel sounds. Extremities: no cyanosis, clubbing, rash, R and LLE trace  edema. Ted hose in place.   Neuro: alert & orientedx3, cranial nerves grossly intact. moves all 4 extremities w/o difficulty. Affect pleasant  Telemetry:  NSR  60s   Labs: Basic Metabolic Panel:  Recent Labs Lab 10/14/15 0900 10/15/15 0348 10/16/15 0239 10/17/15 0231 10/18/15 0742  NA 137 140 140 142 138  K 3.3* 3.9 3.4* 3.8 3.9  CL 97* 100* 98* 102 100*  CO2 GLUCOSE 261* 179* 121* 144* 168*  BUN 61* 57* 51* 40* 37*  CREATININE 2.04* 2.06* 1.93* 1.78* 1.85*  CALCIUM 9.1 9.4 9.2 9.0 9.4    Liver Function Tests:  Recent Labs Lab 10/14/15 0900  AST 34  ALT 31  ALKPHOS 108  BILITOT 1.7*  PROT 6.9  ALBUMIN 3.3*    Recent Labs Lab 10/14/15 0900  LIPASE 35   No results for input(s): AMMONIA in the last 168 hours.  CBC:  Recent Labs Lab 10/14/15 0900 10/16/15 0239  WBC 5.8 5.0  NEUTROABS  --  3.5  HGB 11.6* 12.3*  HCT 37.3* 39.9  MCV 86.1 88.5  PLT 152 171    Cardiac Enzymes: No results for input(s): CKTOTAL, CKMB, CKMBINDEX, TROPONINI in the last 168 hours.  BNP: BNP (last 3 results)  Recent Labs  10/14/15 0900  BNP 814.8*    ProBNP (last 3 results) No results for input(s): PROBNP in the last 8760 hours.    Other results:  Imaging: No results found.   Medications:     Scheduled Medications: . amiodarone  400 mg Oral Daily  . antiseptic oral rinse  7 mL Mouth Rinse q12n4p  . atorvastatin  40 mg Oral Daily  . bisoprolol  5 mg Oral Daily  . chlorhexidine  15 mL Mouth Rinse BID  . clindamycin (CLEOCIN) IV  600 mg Intravenous 4 times per day  . digoxin  0.125 mg Oral Daily  . docusate sodium  200 mg Oral QHS  . furosemide  120 mg Intravenous Q12H  . hydrALAZINE  50 mg Oral TID  . insulin aspart  0-20 Units Subcutaneous TID WC  . insulin aspart  0-5 Units Subcutaneous QHS  . insulin detemir  28 Units Subcutaneous QHS  . isosorbide mononitrate  30 mg Oral Daily  . linagliptin  5 mg Oral Daily  . metolazone  5 mg Oral Daily  . mometasone-formoterol  2 puff Inhalation BID  . montelukast  10 mg Oral QHS  . potassium chloride  40 mEq Oral BID  . rivaroxaban  20 mg Oral Q supper  . sodium chloride  3 mL Intravenous Q12H  . sodium chloride  3 mL Intravenous Q12H    Infusions: . sodium chloride    . milrinone 0.125 mcg/kg/min (10/18/15 0910)    PRN Medications: sodium chloride,  acetaminophen, albuterol, bisacodyl, fluticasone, ondansetron (ZOFRAN) IV, polyethylene glycol, sodium chloride, sodium chloride   Assessment:  1. A/C Systolic/ Diastolic HF 2. A flutter- new onset 3. Acute on CKD Stage IV - due to cardiorenal syndrome 4. Cellulitis 5. HTN 6. Morbid Obesity  7. Hypokalemia   Plan/Discussion:    Much improved. S/P DC-CV . Maintaining NSR.    Volume status improved. Stop IV lasix and metolazone. Start torsemide 40 mg twice a day. Stop milrinone  Continue 5 mg bisoprolol daily, continue hydralazine 50 mg tid, continue imdur 30 mg daily. No spiro for now with CKD. Continue dig 0.125 mg daily. Dig level on admit 0.6.     Continue Xarelto 20 mg daily.    Continue antibiotics per primary team.   Likely home next 24 hours. HF clinic follow up next week.    Length of Stay: 4   Amy Clegg NP-C  10/18/2015, 10:04 AM  Advanced Heart Failure Team Pager [phone] (M-F; 7a - 4p)  Please contact [facility] for night-coverage after hours (4p -7a ) and weekends on [email or web address]  Patient seen and examined with Tonye Becket, NP. We discussed all aspects of the encounter. I agree with the assessment and plan as stated above.   Improved with milrinone and cardioversion. Will stop milrinone today. Switch back to po diuretics. Continue Xarelto. Hopefully home tomorrow.   Atarah Cadogan,MD 2:14 PM

## 2015-10-18 NOTE — Progress Notes (Signed)
RT note: Pt. places self on/off CPAP on own, made aware to notify if needed, RT to monitor.

## 2015-10-18 NOTE — Progress Notes (Signed)
PATIENT DETAILS Name: Gary Hutchinson Age: 55 y.o. Sex: male Date of Birth: 02/22/1961 Admit Date: 10/14/2015 Admitting Physician Ozella Rocks, MD WUJ:WJXBJ,YNWG M, MD  Subjective: Feels better-"getting there"  Assessment/Plan: Principal Problem: Acute on chronic combined systolic and diastolic congestive heart failure: Improving-cards contemplating stopping milrinone infusion later today. Transitioned from Lasix to Demadex. eight decreased to 309 pounds (313 pounds on admission), negative balance of 1.5 L so far. Await further recommendations from cardiology.   Active Problems: Acute on chronic kidney disease stage 3: ARF likely secondary to cardiorenal syndrome. Improving with milrinone infusion-and close to usual baseline. Follow electrolytes.  Atrial flutter: Underwent  DC cardioversion 10/17/15, continue amiodarone, bisoprolol and Xarelto.  Panniculitis: Will discontinue clindamycin as has completed 5 days of treatment. Remains afebrile, only minimal erythema in the RLQ skin.  Hypertension: Controlled, continue bisoprolol, hydralazine, and Imdur. Follow and adjust accordingly  Insulin dependent type 2 diabetes mellitus, uncontrolled: CBG stable, continue 28 units of Lantus and SSI. Follow and adjust accordingly  Dyslipidemia: Continue statin  History of ventricular tachycardia: Status post ICD implantation.  Pulmonary HTN : Likely related to CHF/obesity. Deferred to cardiology.  Obstructive sleep apnea: Continue CPAP  Disposition: Remain inpatient-transfer to Telemetry once off Milrinone infusion  Antimicrobial agents  See below  Anti-infectives    Start     Dose/Rate Route Frequency Ordered Stop   10/15/15 0000  clindamycin (CLEOCIN) IVPB 600 mg     600 mg 100 mL/hr over 30 Minutes Intravenous 4 times per day 10/14/15 2107     10/14/15 1530  clindamycin (CLEOCIN) IVPB 600 mg  Status:  Discontinued     600 mg 100 mL/hr over 30 Minutes  Intravenous  Once 10/14/15 1521 10/14/15 1524   10/14/15 1530  clindamycin (CLEOCIN) capsule 450 mg     450 mg Oral  Once 10/14/15 1525 10/14/15 1822   10/14/15 0000  clindamycin (CLEOCIN) 150 MG capsule     450 mg Oral 4 times daily 10/14/15 1526        DVT Prophylaxis: Xarelto  Code Status: Full code  Family Communication None at bedside  Procedures: DC Cardioversion 1/11  CONSULTS:  cardiology  Time spent 25 minutes-Greater than 50% of this time was spent in counseling, explanation of diagnosis, planning of further management, and coordination of care.  MEDICATIONS: Scheduled Meds: . amiodarone  400 mg Oral Daily  . antiseptic oral rinse  7 mL Mouth Rinse q12n4p  . atorvastatin  40 mg Oral Daily  . bisoprolol  5 mg Oral Daily  . chlorhexidine  15 mL Mouth Rinse BID  . clindamycin (CLEOCIN) IV  600 mg Intravenous 4 times per day  . digoxin  0.125 mg Oral Daily  . docusate sodium  200 mg Oral QHS  . hydrALAZINE  50 mg Oral TID  . insulin aspart  0-20 Units Subcutaneous TID WC  . insulin aspart  0-5 Units Subcutaneous QHS  . insulin detemir  28 Units Subcutaneous QHS  . isosorbide mononitrate  30 mg Oral Daily  . linagliptin  5 mg Oral Daily  . mometasone-formoterol  2 puff Inhalation BID  . montelukast  10 mg Oral QHS  . potassium chloride  40 mEq Oral BID  . rivaroxaban  20 mg Oral Q supper  . sodium chloride  3 mL Intravenous Q12H  . sodium chloride  3 mL Intravenous Q12H  . torsemide  40 mg Oral  BID   Continuous Infusions: . sodium chloride    . milrinone 0.125 mcg/kg/min (10/18/15 0910)   PRN Meds:.sodium chloride, acetaminophen, albuterol, bisacodyl, fluticasone, ondansetron (ZOFRAN) IV, polyethylene glycol, sodium chloride, sodium chloride    PHYSICAL EXAM: Vital signs in last 24 hours: Filed Vitals:   10/17/15 2037 10/17/15 2254 10/18/15 0401 10/18/15 0500  BP: 107/79  126/70   Pulse: 66  62   Temp: 97.7 F (36.5 C) 97.5 F (36.4 C) 97.8 F  (36.6 C)   TempSrc: Oral Oral Axillary   Resp: 24  19   Height:      Weight:    140.2 kg (309 lb 1.4 oz)  SpO2: 95%  94%     Weight change: -0.092 kg (-3.3 oz) Filed Weights   10/17/15 0846 10/17/15 1445 10/18/15 0500  Weight: 139.708 kg (308 lb) 139.708 kg (308 lb) 140.2 kg (309 lb 1.4 oz)   Body mass index is 51.43 kg/(m^2).   Gen Exam: Awake and alert with clear speech.   Neck: Supple, No JVD.   Chest: B/L Clear.   CVS: S1 S2 Regular Abdomen: soft, BS +, non tender, non distended.  Extremities: trace edema, lower extremities warm to touch. Neurologic: Non Focal.   Skin: No Rash.   Wounds: N/A.   Intake/Output from previous day:  Intake/Output Summary (Last 24 hours) at 10/18/15 1021 Last data filed at 10/18/15 0522  Gross per 24 hour  Intake 1351.4 ml  Output   1000 ml  Net  351.4 ml     LAB RESULTS: CBC  Recent Labs Lab 10/14/15 0900 10/16/15 0239  WBC 5.8 5.0  HGB 11.6* 12.3*  HCT 37.3* 39.9  PLT 152 171  MCV 86.1 88.5  MCH 26.8 27.3  MCHC 31.1 30.8  RDW 18.5* 19.4*  LYMPHSABS  --  0.9  MONOABS  --  0.5  EOSABS  --  0.1  BASOSABS  --  0.0    Chemistries   Recent Labs Lab 10/14/15 0900 10/15/15 0348 10/16/15 0239 10/17/15 0231 10/18/15 0742  NA 137 140 140 142 138  K 3.3* 3.9 3.4* 3.8 3.9  CL 97* 100* 98* 102 100*  CO2 25 30 31 27 29   GLUCOSE 261* 179* 121* 144* 168*  BUN 61* 57* 51* 40* 37*  CREATININE 2.04* 2.06* 1.93* 1.78* 1.85*  CALCIUM 9.1 9.4 9.2 9.0 9.4    CBG:  Recent Labs Lab 10/17/15 0826 10/17/15 1153 10/17/15 1721 10/17/15 2209 10/18/15 0753  GLUCAP 114* 132* 183* 141* 176*    GFR Estimated Creatinine Clearance: 60 mL/min (by C-G formula based on Cr of 1.85).  Coagulation profile No results for input(s): INR, PROTIME in the last 168 hours.  Cardiac Enzymes No results for input(s): CKMB, TROPONINI, MYOGLOBIN in the last 168 hours.  Invalid input(s): CK  Invalid input(s): POCBNP No results for  input(s): DDIMER in the last 72 hours. No results for input(s): HGBA1C in the last 72 hours. No results for input(s): CHOL, HDL, LDLCALC, TRIG, CHOLHDL, LDLDIRECT in the last 72 hours. No results for input(s): TSH, T4TOTAL, T3FREE, THYROIDAB in the last 72 hours.  Invalid input(s): FREET3 No results for input(s): VITAMINB12, FOLATE, FERRITIN, TIBC, IRON, RETICCTPCT in the last 72 hours. No results for input(s): LIPASE, AMYLASE in the last 72 hours.  Urine Studies No results for input(s): UHGB, CRYS in the last 72 hours.  Invalid input(s): UACOL, UAPR, USPG, UPH, UTP, UGL, UKET, UBIL, UNIT, UROB, ULEU, UEPI, UWBC, URBC, UBAC, CAST, UCOM, BILUA  MICROBIOLOGY: Recent Results (from the past 240 hour(s))  MRSA PCR Screening     Status: None   Collection Time: 10/14/15  9:06 PM  Result Value Ref Range Status   MRSA by PCR NEGATIVE NEGATIVE Final    Comment:        The GeneXpert MRSA Assay (FDA approved for NASAL specimens only), is one component of a comprehensive MRSA colonization surveillance program. It is not intended to diagnose MRSA infection nor to guide or monitor treatment for MRSA infections.     RADIOLOGY STUDIES/RESULTS: Ct Abdomen Pelvis Wo Contrast  10/14/2015  CLINICAL DATA:  Patient with generalized weakness and abdominal distension. EXAM: CT ABDOMEN AND PELVIS WITHOUT CONTRAST TECHNIQUE: Multidetector CT imaging of the abdomen and pelvis was performed following the standard protocol without IV contrast. COMPARISON:  Ultrasound abdomen 07/11/2011 FINDINGS: Lower chest: Cardiomegaly. Dependent atelectasis within the lung bases bilaterally. No pleural effusion. Hepatobiliary: Liver is diffusely low in attenuation compatible with steatosis. Layering high attenuation material within the gallbladder lumen. No gallbladder wall thickening. No intrahepatic or extrahepatic biliary ductal dilatation. Pancreas: Unremarkable Spleen: Unremarkable Adrenals/Urinary Tract: Adrenal glands  are normal. Kidneys are symmetric in size. No hydronephrosis. Urinary bladder is unremarkable. Stomach/Bowel: Sigmoid colonic diverticulosis. No CT evidence for acute diverticulitis. The appendix is normal. No evidence for bowel obstruction. No free fluid or free intraperitoneal air. Normal morphology to the stomach. Vascular/Lymphatic: Normal caliber abdominal aorta. No retroperitoneal lymphadenopathy. Other: Small fat containing bilateral inguinal hernias. Prostate unremarkable. Musculoskeletal: Lumbar spine degenerative changes. No aggressive or acute appearing osseous lesion. Small amount of fluid within umbilicus. Subcutaneous fat stranding involving the pannus. IMPRESSION: Subcutaneous fat stranding involving the pannus. Recommend clinical correlation to exclude the possibility of cellulitis. No acute intra-abdominal process identified. Hepatic steatosis. Cardiomegaly. Layering high attenuation in the gallbladder lumen suggestive of sludge. Electronically Signed   By: Annia Belt M.D.   On: 10/14/2015 14:37   Dg Chest 2 View  10/14/2015  CLINICAL DATA:  55 year old male with weakness, abdominal pain and nausea EXAM: CHEST  2 VIEW COMPARISON:  Prior chest x-ray 03/07/2014 FINDINGS: Stable position of left subclavian approach intracardiac defibrillator. There is marked enlargement of the cardiopericardial silhouette which is similar compared to prior. Background pulmonary vascular congestion without definitive edema. Limited lateral chest x-ray secondary to patient body habitus. No pleural effusion, pneumothorax or definite focal airspace consolidation. IMPRESSION: 1. Stable marked enlargement of the cardiopericardial silhouette compared to prior imaging which may reflect cardiomegaly and/or pericardial effusion. 2. Stable position of intracardiac defibrillator. 3. Pulmonary vascular congestion without overt edema. Electronically Signed   By: Malachy Moan M.D.   On: 10/14/2015 09:59    Jeoffrey Massed,  MD  [facility] Pager:[phone]  If 7PM-7AM, please contact night-coverage [email or web address] Password [email or web address] 10/18/2015, 10:21 AM   LOS: 4 days

## 2015-10-18 NOTE — Progress Notes (Signed)
Patient places himself on and off CPAP when ready.

## 2015-10-18 NOTE — Care Management Important Message (Signed)
Important Message  Patient Details  Name: Gary Hutchinson MRN: [id number] Date of Birth: 10-19-1960   Medicare Important Message Given:  Yes    Kyla Balzarine 10/18/2015, 12:27 PM

## 2015-10-19 LAB — GLUCOSE, CAPILLARY
Glucose-Capillary: 118 mg/dL — ABNORMAL HIGH (ref 65–99)
Glucose-Capillary: 157 mg/dL — ABNORMAL HIGH (ref 65–99)

## 2015-10-19 LAB — BASIC METABOLIC PANEL
ANION GAP: 10 (ref 5–15)
BUN: 40 mg/dL — ABNORMAL HIGH (ref 6–20)
CALCIUM: 9 mg/dL (ref 8.9–10.3)
CO2: 28 mmol/L (ref 22–32)
CREATININE: 2.15 mg/dL — AB (ref 0.61–1.24)
Chloride: 99 mmol/L — ABNORMAL LOW (ref 101–111)
GFR, EST AFRICAN AMERICAN: 38 mL/min — AB (ref >60)
GFR, EST NON AFRICAN AMERICAN: 33 mL/min — AB (ref >60)
GLUCOSE: 137 mg/dL — AB (ref 65–99)
Potassium: 3.6 mmol/L (ref 3.5–5.1)
Sodium: 137 mmol/L (ref 135–145)

## 2015-10-19 MED ORDER — AMIODARONE HCL 100 MG PO TABS: 400.0000 mg | ORAL_TABLET | Freq: Every day | ORAL | Status: DC

## 2015-10-19 MED ORDER — POTASSIUM CHLORIDE CRYS ER 20 MEQ PO TBCR
20.0000 meq | EXTENDED_RELEASE_TABLET | Freq: Once | ORAL | Status: AC
  Administered 2015-10-19: 20 meq via ORAL
  Filled 2015-10-19: qty 1

## 2015-10-19 MED ORDER — RIVAROXABAN 20 MG PO TABS: 20.0000 mg | ORAL_TABLET | Freq: Every day | ORAL | Status: DC

## 2015-10-19 MED ORDER — DIGOXIN 125 MCG PO TABS: 0.1250 mg | ORAL_TABLET | Freq: Every day | ORAL | Status: DC

## 2015-10-19 MED ORDER — TORSEMIDE 20 MG PO TABS: 40.0000 mg | ORAL_TABLET | Freq: Two times a day (BID) | ORAL | Status: DC

## 2015-10-19 MED ORDER — POTASSIUM CHLORIDE CRYS ER 20 MEQ PO TBCR: 20.0000 meq | EXTENDED_RELEASE_TABLET | Freq: Two times a day (BID) | ORAL | Status: DC

## 2015-10-19 NOTE — Care Management Note (Signed)
Case Management Note  Patient Details  Name: Gary Hutchinson MRN: [id number] Date of Birth: Dec 04, 1960  Subjective/Objective:       Admitted with CHF             Action/Plan: Patient is independent of all ADL's, continue to drive and is very active in the community. He has scales and weighs himself everyday. Patient states that he eats a heart health diet but with the recent loss of his mother his schedule has been in out of synch. He is still in mourning for his mother, lots of emotional support given. He has private insurance with [facility], admits to no problem getting his medication. No needs identified.  Expected Discharge Date:  10/19/2015             Expected Discharge Plan:  Home/Self Care  Discharge planning Services  CM Consult   Choice offered to:  NA  Status of Service:  Completed, signed off  Medicare Important Message Given:  Yes  Reola Mosher [phone] 10/19/2015, 12:52 PM

## 2015-10-19 NOTE — Progress Notes (Signed)
Advanced Heart Failure Rounding Note   Subjective:    Admitted with dyspnea,volume overload, cellulitis and new A flutter. Diuresed with 120 mg IV lasix twice daily + metolazone.  Started on antibiotics per primary team.  s/p DC-CV 10/17/15. Remains in NSR. Milrinone and IV lasix stopped 10/18/15.   Feels fine today. Denies SOB, CP, lightheadedness, or dizziness. Says edema has improved a lot. Wants to go home.   I/O even yesterday. Received a dose of metolazone in the evening which explains bump in Cr as weight is below previous baseline. Weight stable.     Objective:   Weight Range:  Vital Signs:   Temp:  [97.1 F (36.2 C)-98.5 F (36.9 C)] 97.9 F (36.6 C) (01/13 0439) Pulse Rate:  [52-71] 52 (01/13 0439) Resp:  [17-27] 20 (01/13 0439) BP: (102-144)/(52-87) 102/60 mmHg (01/13 0439) SpO2:  [91 %-98 %] 98 % (01/13 0439) Weight:  [307 lb 1.6 oz (139.3 kg)] 307 lb 1.6 oz (139.3 kg) (01/13 0439) Last BM Date: 10/18/15  Weight change: Filed Weights   10/18/15 0500 10/18/15 2100 10/19/15 0439  Weight: 309 lb 1.4 oz (140.2 kg) 307 lb 1.6 oz (139.3 kg) 307 lb 1.6 oz (139.3 kg)    Intake/Output:   Intake/Output Summary (Last 24 hours) at 10/19/15 0747 Last data filed at 10/19/15 0600  Gross per 24 hour  Intake   1760 ml  Output   1950 ml  Net   -190 ml     Physical Exam: General:  No resp difficulty. Sitting on side of bed.   HEENT: normal Neck: supple. JVP 8-9cm.  Carotids 2+ bilat; no bruits. No thyromegaly or nodule noted. Cor: PMI nonpalpable. RRR, no M/G/R appreciated. Lungs: CTAB, normal effort. Abdomen: obese soft, NT, ND, no HSM. No bruits or masses. +BS  Extremities: no cyanosis, clubbing, rash, R and LLE trace-1+ edema. Ted hose in place.   Neuro: alert & orientedx3, cranial nerves grossly intact. moves all 4 extremities w/o difficulty. Affect pleasant  Telemetry:  NSR/Brady 50-60s   Labs: Basic Metabolic Panel:  Recent Labs Lab 10/15/15 0348  10/16/15 0239 10/17/15 0231 10/18/15 0742 10/19/15 0335  NA 140 140 142 138 137  K 3.9 3.4* 3.8 3.9 3.6  CL 100* 98* 102 100* 99*  CO2 GLUCOSE 179* 121* 144* 168* 137*  BUN 57* 51* 40* 37* 40*  CREATININE 2.06* 1.93* 1.78* 1.85* 2.15*  CALCIUM 9.4 9.2 9.0 9.4 9.0    Liver Function Tests:  Recent Labs Lab 10/14/15 0900  AST 34  ALT 31  ALKPHOS 108  BILITOT 1.7*  PROT 6.9  ALBUMIN 3.3*    Recent Labs Lab 10/14/15 0900  LIPASE 35   No results for input(s): AMMONIA in the last 168 hours.  CBC:  Recent Labs Lab 10/14/15 0900 10/16/15 0239  WBC 5.8 5.0  NEUTROABS  --  3.5  HGB 11.6* 12.3*  HCT 37.3* 39.9  MCV 86.1 88.5  PLT 152 171    Cardiac Enzymes: No results for input(s): CKTOTAL, CKMB, CKMBINDEX, TROPONINI in the last 168 hours.  BNP: BNP (last 3 results)  Recent Labs  10/14/15 0900  BNP 814.8*    ProBNP (last 3 results) No results for input(s): PROBNP in the last 8760 hours.    Other results:  Imaging: No results found.   Medications:     Scheduled Medications: . amiodarone  400 mg Oral Daily  . antiseptic oral rinse  7 mL Mouth Rinse  q12n4p  . atorvastatin  40 mg Oral Daily  . bisoprolol  5 mg Oral Daily  . chlorhexidine  15 mL Mouth Rinse BID  . clindamycin (CLEOCIN) IV  600 mg Intravenous 4 times per day  . digoxin  0.125 mg Oral Daily  . docusate sodium  200 mg Oral QHS  . hydrALAZINE  50 mg Oral TID  . insulin aspart  0-20 Units Subcutaneous TID WC  . insulin aspart  0-5 Units Subcutaneous QHS  . insulin detemir  28 Units Subcutaneous QHS  . isosorbide mononitrate  30 mg Oral Daily  . linagliptin  5 mg Oral Daily  . metolazone  5 mg Oral Daily  . mometasone-formoterol  2 puff Inhalation BID  . montelukast  10 mg Oral QHS  . potassium chloride  40 mEq Oral BID  . rivaroxaban  20 mg Oral Q supper  . sodium chloride  3 mL Intravenous Q12H  . sodium chloride  3 mL Intravenous Q12H  . torsemide  40 mg  Oral BID    Infusions: . sodium chloride      PRN Medications: sodium chloride, acetaminophen, albuterol, bisacodyl, fluticasone, ondansetron (ZOFRAN) IV, polyethylene glycol, sodium chloride, sodium chloride   Assessment:  1. A/C Systolic/ Diastolic HF 2. A flutter- new onset 3. Acute on CKD Stage IV - due to cardiorenal syndrome 4. Cellulitis 5. HTN 6. Morbid Obesity  7. Hypokalemia  Plan/Discussion:    Much improved. S/P DC-CV . Maintaining NSR.    Volume status improved. IV lasix and metolazone stopped 10/18/15. He did, though, receive an evening dose of metolazone yesterday.  He does have a mild amount of volume on board, but weight below baseline and creatinine bumped, will continue po diuretics, at increased chronic dose as below.   Weight stable on torsemide 40 mg BID with K meq 20 BID. Will give extra 20 meq today.  Continue 5 mg bisoprolol daily, hydralazine 50 mg tid, imdur 30 mg daily. No spiro for now with AKI.  Continue dig 0.125 mg daily. Dig level 10/16/15 0.7    Continue Xarelto 20 mg daily.    ABX per primary team.   Has close follow up next week, 10/25/15. Stable for d/c.   Length of Stay: 5  Graciella Freer [name]  10/19/2015, 7:47 AM  Advanced Heart Failure Team Pager [phone] (M-F; 7a - 4p)  Please contact [facility] for night-coverage after hours (4p -7a ) and weekends on [email or web address]  Patient seen and examined with Otilio Saber, [name]. We discussed all aspects of the encounter. I agree with the assessment and plan as stated above.   Much improved. Stable off milrinone. Maintaining SR. Resume torsemide. Continue Xarelto. Can go home today Will follow in clinic.  [name], Daniel,MD 4:19 PM

## 2015-10-19 NOTE — Progress Notes (Signed)
Pt got discharged to home, discharge instructions provided and patient showed understanding to it, IV taken out,Telemonitor DC,pt left unit in wheelchair with all of the belongings. 

## 2015-10-19 NOTE — Discharge Summary (Addendum)
PATIENT DETAILS Name: Gary Hutchinson Age: 55 y.o. Sex: male Date of Birth: [birth year] MRN: [id number]. Admitting Physician: Ozella Rocks, MD WUJ:[id number],YNWG Judie Petit, MD  Admit Date: 10/14/2015 Discharge date: 10/19/2015  Recommendations for Outpatient Follow-up:  1. Titrate down dose of Amiodarone 2. May need to start Aldactone 3. Please repeat CBC/BMET at next visit  PRIMARY DISCHARGE DIAGNOSIS:  Principal Problem:   Acute on chronic combined systolic and diastolic congestive heart failure, NYHA class 2 (HCC) Active Problems:   Insulin dependent type 2 diabetes mellitus, uncontrolled (HCC)   Morbid obesity (HCC)   ANEMIA-IRON DEFICIENCY   Obstructive sleep apnea   HTN (hypertension)   Pulmonary HTN (HCC)   Atrial fibrillation (HCC)   Acute renal failure superimposed on stage 3 chronic kidney disease (HCC)   Acute panniculitis   Acute on chronic systolic heart failure, NYHA class 2 (HCC)   Atrial flutter (HCC)   Acute renal failure superimposed on stage 4 chronic kidney disease (HCC)      PAST MEDICAL HISTORY: Past Medical History  Diagnosis Date  . Nonischemic cardiomyopathy (HCC)     a. LHC (02/2011) normal coronaries  . Ventricular tachycardia [facility])     s/p MDT ICD implant  . Alcohol abuse     now quit  . Pulmonary hypertension (HCC)   . Diverticulosis of colon   . Hemorrhoids, internal   . Cholecystitis   . Morbid obesity (HCC)   . Obstructive sleep apnea   . Anemia     iron defi  . Hypertension   . Gout   . Diabetes mellitus   . Chronic systolic congestive heart failure (HCC)     a. RHC (02/2011) RA 31/27, RV 71/27, PA67/45, PCWP 46, PA 49%, Fick CO: 3.4 b. ECHO (03/2014) EF 30-35%, grade II DD, mild MR, RV poorly visualized appears mildly decreased c. RHC (05/2014) RA 13, RV 54/4/11, PA 60/22 (37), PCWP 17, Fick CO/CI: 4.4 / 1.9, Thermo CO/CI: 3.8 /1.6, PVR 5.2 WU, PA 57% and 59%  . Asthma   . Paroxysmal atrial fibrillation (HCC)   . Renal insufficiency      DISCHARGE MEDICATIONS: Current Discharge Medication List    CONTINUE these medications which have CHANGED   Details  amiodarone (PACERONE) 100 MG tablet Take 4 tablets (400 mg total) by mouth daily. Qty: 60 tablet, Refills: 0    digoxin (LANOXIN) 0.125 MG tablet Take 1 tablet (0.125 mg total) by mouth daily. Qty: 30 tablet, Refills: 0    Rivaroxaban (XARELTO) 20 MG TABS tablet Take 1 tablet (20 mg total) by mouth daily. Qty: 30 tablet, Refills: 6    torsemide (DEMADEX) 20 MG tablet Take 2 tablets (40 mg total) by mouth 2 (two) times daily. Qty: 60 tablet, Refills: 0      CONTINUE these medications which have NOT CHANGED   Details  albuterol (PROAIR HFA) 108 (90 BASE) MCG/ACT inhaler Inhale 2 puffs into the lungs every 6 (six) hours as needed. For wheezing or shortness of breath    allopurinol (ZYLOPRIM) 100 MG tablet Take 3 tablets by mouth daily.     atorvastatin (LIPITOR) 40 MG tablet Take 1 tablet (40 mg total) by mouth daily. Qty: 90 tablet, Refills: 6    bisoprolol (ZEBETA) 10 MG tablet TAKE 1/2 TABLETS (5 MG TOTAL) BY MOUTH DAILY. Qty: 15 tablet, Refills: 6    colchicine 0.6 MG tablet Take 0.6 mg by mouth daily as needed. For gout    fluticasone (FLONASE) 50 MCG/ACT nasal  spray Place 2 sprays into the nose daily as needed for allergies.     Fluticasone-Salmeterol (ADVAIR DISKUS) 250-50 MCG/DOSE AEPB Inhale 1 puff into the lungs every 12 (twelve) hours as needed. For wheezing or shortness of breath    hydrALAZINE (APRESOLINE) 50 MG tablet Take 50 mg by mouth 2 (two) times daily.    insulin detemir (LEVEMIR) 100 UNIT/ML injection Inject 28 Units into the skin at bedtime.    isosorbide mononitrate (IMDUR) 30 MG 24 hr tablet TAKE 1 TABLET (30 MG TOTAL) BY MOUTH 2 (TWO) TIMES DAILY. Qty: 60 tablet, Refills: 3    linagliptin (TRADJENTA) 5 MG TABS tablet Take 1 tablet (5 mg total) by mouth daily. Qty: 30 tablet, Refills: 3    metolazone (ZAROXOLYN) 2.5 MG tablet  Take 2.5 mg by mouth daily as needed (edema).    montelukast (SINGULAIR) 10 MG tablet Take 10 mg by mouth daily as needed. For shortness of breath or wheezing    potassium chloride SA (K-DUR,KLOR-CON) 20 MEQ tablet Take 20 mEq by mouth daily.    acetaminophen (TYLENOL) 325 MG tablet Take 650 mg by mouth every 6 (six) hours as needed (pain). Reported on 10/11/2015      STOP taking these medications     spironolactone (ALDACTONE) 25 MG tablet         ALLERGIES:  No Known Allergies  BRIEF HPI:  See H&P, Labs, Consult and Test reports for all details in brief, patient was admitted for evaluation of SOB and worsening lower ext edema.   CONSULTATIONS:   cardiology  PERTINENT RADIOLOGIC STUDIES: Ct Abdomen Pelvis Wo Contrast  10/14/2015  CLINICAL DATA:  Patient with generalized weakness and abdominal distension. EXAM: CT ABDOMEN AND PELVIS WITHOUT CONTRAST TECHNIQUE: Multidetector CT imaging of the abdomen and pelvis was performed following the standard protocol without IV contrast. COMPARISON:  Ultrasound abdomen 07/11/2011 FINDINGS: Lower chest: Cardiomegaly. Dependent atelectasis within the lung bases bilaterally. No pleural effusion. Hepatobiliary: Liver is diffusely low in attenuation compatible with steatosis. Layering high attenuation material within the gallbladder lumen. No gallbladder wall thickening. No intrahepatic or extrahepatic biliary ductal dilatation. Pancreas: Unremarkable Spleen: Unremarkable Adrenals/Urinary Tract: Adrenal glands are normal. Kidneys are symmetric in size. No hydronephrosis. Urinary bladder is unremarkable. Stomach/Bowel: Sigmoid colonic diverticulosis. No CT evidence for acute diverticulitis. The appendix is normal. No evidence for bowel obstruction. No free fluid or free intraperitoneal air. Normal morphology to the stomach. Vascular/Lymphatic: Normal caliber abdominal aorta. No retroperitoneal lymphadenopathy. Other: Small fat containing bilateral inguinal  hernias. Prostate unremarkable. Musculoskeletal: Lumbar spine degenerative changes. No aggressive or acute appearing osseous lesion. Small amount of fluid within umbilicus. Subcutaneous fat stranding involving the pannus. IMPRESSION: Subcutaneous fat stranding involving the pannus. Recommend clinical correlation to exclude the possibility of cellulitis. No acute intra-abdominal process identified. Hepatic steatosis. Cardiomegaly. Layering high attenuation in the gallbladder lumen suggestive of sludge. Electronically Signed   By: Annia Belt M.D.   On: 10/14/2015 14:37   Dg Chest 2 View  10/14/2015  CLINICAL DATA:  55 year old male with weakness, abdominal pain and nausea EXAM: CHEST  2 VIEW COMPARISON:  Prior chest x-ray 03/07/2014 FINDINGS: Stable position of left subclavian approach intracardiac defibrillator. There is marked enlargement of the cardiopericardial silhouette which is similar compared to prior. Background pulmonary vascular congestion without definitive edema. Limited lateral chest x-ray secondary to patient body habitus. No pleural effusion, pneumothorax or definite focal airspace consolidation. IMPRESSION: 1. Stable marked enlargement of the cardiopericardial silhouette compared to prior imaging which may  reflect cardiomegaly and/or pericardial effusion. 2. Stable position of intracardiac defibrillator. 3. Pulmonary vascular congestion without overt edema. Electronically Signed   By: Malachy Moan M.D.   On: 10/14/2015 09:59     PERTINENT LAB RESULTS: CBC: No results for input(s): WBC, HGB, HCT, PLT in the last 72 hours. CMET CMP     Component Value Date/Time   NA 137 10/19/2015 0335   K 3.6 10/19/2015 0335   CL 99* 10/19/2015 0335   CO2 28 10/19/2015 0335   GLUCOSE 137* 10/19/2015 0335   BUN 40* 10/19/2015 0335   CREATININE 2.15* 10/19/2015 0335   CALCIUM 9.0 10/19/2015 0335   PROT 6.9 10/14/2015 0900   ALBUMIN 3.3* 10/14/2015 0900   AST 34 10/14/2015 0900   ALT 31  10/14/2015 0900   ALKPHOS 108 10/14/2015 0900   BILITOT 1.7* 10/14/2015 0900   GFRNONAA 33* 10/19/2015 0335   GFRAA 38* 10/19/2015 0335    GFR Estimated Creatinine Clearance: 51.4 mL/min (by C-G formula based on Cr of 2.15). No results for input(s): LIPASE, AMYLASE in the last 72 hours. No results for input(s): CKTOTAL, CKMB, CKMBINDEX, TROPONINI in the last 72 hours. Invalid input(s): POCBNP No results for input(s): DDIMER in the last 72 hours. No results for input(s): HGBA1C in the last 72 hours. No results for input(s): CHOL, HDL, LDLCALC, TRIG, CHOLHDL, LDLDIRECT in the last 72 hours. No results for input(s): TSH, T4TOTAL, T3FREE, THYROIDAB in the last 72 hours.  Invalid input(s): FREET3 No results for input(s): VITAMINB12, FOLATE, FERRITIN, TIBC, IRON, RETICCTPCT in the last 72 hours. Coags: No results for input(s): INR in the last 72 hours.  Invalid input(s): PT Microbiology: Recent Results (from the past 240 hour(s))  MRSA PCR Screening     Status: None   Collection Time: 10/14/15  9:06 PM  Result Value Ref Range Status   MRSA by PCR NEGATIVE NEGATIVE Final    Comment:        The GeneXpert MRSA Assay (FDA approved for NASAL specimens only), is one component of a comprehensive MRSA colonization surveillance program. It is not intended to diagnose MRSA infection nor to guide or monitor treatment for MRSA infections.      BRIEF HOSPITAL COURSE:  Acute on chronic combined systolic and diastolic congestive heart failure: Significantly Improved-cards followed throughout this hospital stay, required milrinone infusion and IV Diuretics. By discharge much compensated, has been on oral diuretics for the past 24 hours with stable weight-weight down to 307 lbs on discharge. Will discharge on  Demadex 40 mg BID, Aldactone on hold for now. Has outpatient follow up with cardiology already arranged.   Active Problems: Acute on chronic kidney disease stage 3: ARF likely secondary  to cardiorenal syndrome. Improved with milrinone infusion-and close to usual baseline. Follow electrolytes closely as outpatient  Atrial flutter: Underwent DC cardioversion 10/17/15, continue amiodarone 400 mg daily-cardiology will taper further in the outpatient setting. Will be continued on bisoprolol and Xarelto.  Panniculitis: Will discontinue clindamycin as has completed 5 days of treatment. Remains afebrile, No erythema in the RLQ skin.  Hypertension: Controlled, continue bisoprolol, hydralazine, and Imdur. Follow and adjust accordingly  Insulin dependent type 2 diabetes mellitus, uncontrolled: CBG stable, continue 28 units of Lantus. Follow and adjust accordingly  Dyslipidemia: Continue statin  History of ventricular tachycardia: Status post ICD implantation.  Pulmonary HTN : Likely related to CHF/obesity. Deferred to cardiology.  Obstructive sleep apnea: Continue CPAP   TODAY-DAY OF DISCHARGE:  Subjective:   Krrish Freund today has  no headache,no chest abdominal pain,no new weakness tingling or numbness, feels much better wants to go home today.   Objective:   Blood pressure 97/58, pulse 56, temperature 97.8 F (36.6 C), temperature source Oral, resp. rate 16, height 5\' 5"  (1.651 m), weight 139.3 kg (307 lb 1.6 oz), SpO2 93 %.  Intake/Output Summary (Last 24 hours) at 10/19/15 1116 Last data filed at 10/19/15 0830  Gross per 24 hour  Intake   1510 ml  Output    225 ml  Net   1285 ml   Filed Weights   10/18/15 0500 10/18/15 2100 10/19/15 0439  Weight: 140.2 kg (309 lb 1.4 oz) 139.3 kg (307 lb 1.6 oz) 139.3 kg (307 lb 1.6 oz)    Exam Awake Alert, Oriented *3, No new F.N deficits, Normal affect [place].AT,PERRAL Supple Neck,No JVD, No cervical lymphadenopathy appriciated.  Symmetrical Chest wall movement, Good air movement bilaterally, CTAB RRR,No Gallops,Rubs or new Murmurs, No Parasternal Heave +ve B.Sounds, Abd Soft, Non tender, No organomegaly appriciated, No  rebound -guarding or rigidity. No Cyanosis, Clubbing, No new Rash or bruise  DISCHARGE CONDITION: Stable  DISPOSITION: Home  DISCHARGE INSTRUCTIONS:    Activity:  As tolerated with Full fall precautions use walker/cane & assistance as needed  Get Medicines reviewed and adjusted: Please take all your medications with you for your next visit with your Primary MD  Please request your Primary MD to go over all hospital tests and procedure/radiological results at the follow up, please ask your Primary MD to get all Hospital records sent to his/her office.  If you experience worsening of your admission symptoms, develop shortness of breath, life threatening emergency, suicidal or homicidal thoughts you must seek medical attention immediately by calling 911 or calling your MD immediately  if symptoms less severe.  You must read complete instructions/literature along with all the possible adverse reactions/side effects for all the Medicines you take and that have been prescribed to you. Take any new Medicines after you have completely understood and accpet all the possible adverse reactions/side effects.   Do not drive when taking Pain medications.   Do not take more than prescribed Pain, Sleep and Anxiety Medications  Special Instructions: If you have smoked or chewed Tobacco  in the last 2 yrs please stop smoking, stop any regular Alcohol  and or any Recreational drug use.  Wear Seat belts while driving.  Please note  You were cared for by a hospitalist during your hospital stay. Once you are discharged, your primary care physician will handle any further medical issues. Please note that NO REFILLS for any discharge medications will be authorized once you are discharged, as it is imperative that you return to your primary care physician (or establish a relationship with a primary care physician if you do not have one) for your aftercare needs so that they can reassess your need for  medications and monitor your lab values.   Diet recommendation: Diabetic Diet Heart Healthy diet  Discharge Instructions    (HEART FAILURE PATIENTS) Call MD:  Anytime you have any of the following symptoms: 1) 3 pound weight gain in 24 hours or 5 pounds in 1 week 2) shortness of breath, with or without a dry hacking cough 3) swelling in the hands, feet or stomach 4) if you have to sleep on extra pillows at night in order to breathe.    Complete by:  As directed      Diet - low sodium heart healthy  Complete by:  As directed      Diet Carb Modified    Complete by:  As directed      Increase activity slowly    Complete by:  As directed            Follow-up Information    Follow up with Tonye Becket, NP On 10/25/2015.   Specialty:  Cardiology   Why:  Larwance Sachs Code 1000. At 11:40    Contact information:   [address] [place] [address] [phone]       Follow up with [name], MD. Schedule an appointment as soon as possible for a visit in 1 week.   Specialty:  Internal Medicine   Why:  Hospital follow up, Repeat electrolytes   Contact information:   [address] [place] [address] [phone]      Total Time spent on discharge equals 45 minutes.  SignedJeoffrey Massed 10/19/2015 11:16 AM

## 2015-10-25 ENCOUNTER — Ambulatory Visit (HOSPITAL_COMMUNITY)
Admit: 2015-10-25 | Discharge: 2015-10-25 | Disposition: A | Discharge status: Home / Self Care | Payer: Medicare Other | Source: Ambulatory Visit | Attending: Cardiology | Admitting: Cardiology

## 2015-10-25 ENCOUNTER — Telehealth (HOSPITAL_COMMUNITY): Payer: Self-pay | Admitting: Cardiology

## 2015-10-25 VITALS — BP 122/70 | HR 74 | Wt 299.0 lb

## 2015-10-25 DIAGNOSIS — I5022 Chronic systolic (congestive) heart failure: Secondary | ICD-10-CM

## 2015-10-25 DIAGNOSIS — N183 Chronic kidney disease, stage 3 (moderate): Secondary | ICD-10-CM | POA: Insufficient documentation

## 2015-10-25 DIAGNOSIS — I4892 Unspecified atrial flutter: Secondary | ICD-10-CM | POA: Diagnosis not present

## 2015-10-25 DIAGNOSIS — E669 Obesity, unspecified: Secondary | ICD-10-CM | POA: Diagnosis not present

## 2015-10-25 DIAGNOSIS — I130 Hypertensive heart and chronic kidney disease with heart failure and stage 1 through stage 4 chronic kidney disease, or unspecified chronic kidney disease: Secondary | ICD-10-CM | POA: Insufficient documentation

## 2015-10-25 DIAGNOSIS — I480 Paroxysmal atrial fibrillation: Secondary | ICD-10-CM

## 2015-10-25 DIAGNOSIS — I5023 Acute on chronic systolic (congestive) heart failure: Secondary | ICD-10-CM | POA: Diagnosis not present

## 2015-10-25 DIAGNOSIS — G4733 Obstructive sleep apnea (adult) (pediatric): Secondary | ICD-10-CM | POA: Diagnosis not present

## 2015-10-25 LAB — BASIC METABOLIC PANEL
ANION GAP: 15 (ref 5–15)
BUN: 44 mg/dL — ABNORMAL HIGH (ref 6–20)
CALCIUM: 9.6 mg/dL (ref 8.9–10.3)
CO2: 27 mmol/L (ref 22–32)
Chloride: 93 mmol/L — ABNORMAL LOW (ref 101–111)
Creatinine, Ser: 1.91 mg/dL — ABNORMAL HIGH (ref 0.61–1.24)
GFR calc Af Amer: 44 mL/min — ABNORMAL LOW (ref >60)
GFR, EST NON AFRICAN AMERICAN: 38 mL/min — AB (ref >60)
GLUCOSE: 181 mg/dL — AB (ref 65–99)
Potassium: 3.1 mmol/L — ABNORMAL LOW (ref 3.5–5.1)
Sodium: 135 mmol/L (ref 135–145)

## 2015-10-25 MED ORDER — POTASSIUM CHLORIDE CRYS ER 20 MEQ PO TBCR: EXTENDED_RELEASE_TABLET | ORAL | Status: DC

## 2015-10-25 MED ORDER — AMIODARONE HCL 200 MG PO TABS: 200.0000 mg | ORAL_TABLET | Freq: Every day | ORAL | Status: DC

## 2015-10-25 NOTE — Telephone Encounter (Signed)
Patient aware and voiced understanding

## 2015-10-25 NOTE — Patient Instructions (Signed)
1. Decrease Amiodarone to 200mg  (one pill) once a day.   2. Will call you with your lab work from today  3. Appointment with Amy again on March 2nd at 10:00 am. [place] code is 70

## 2015-10-25 NOTE — Telephone Encounter (Signed)
-----   Message from Sherald Hess, NP sent at 10/25/2015  4:12 PM EST ----- Please call and ask him to take K dur 40 meq in am and 20 meq in pm.

## 2015-10-25 NOTE — Progress Notes (Signed)
Patient ID: Gary Hutchinson, male   DOB: 1961/01/02, 55 y.o.   MRN: [id number]  [facility]  Patient ID: Gary Hutchinson, male   DOB: 30-Dec-1960, 55 y.o.   MRN: [id number] PCP: Creola Corn EP: Hillis Range  HPI: Gary Hutchinson is a 55 year old male with a history of chronic systolic heart failure, ICM s/p ICD, HTN, DM II, OSA on CPAP, history of ventricular tachycardia status post AICD placement in 2009, atrial fibrillation and morbid obesity.    Admitted 6/1-03/13/14 for A/C HF and cardiogenic shock (co-ox 42%). Diuresed with IV lasix and milrinone which were weaned off.  He went into Afib with RVR and chemically cardioverted back to NSR with IV amiodarone. Placed on Xarelto. BB restarted 1/2 dose and held ACE-I. His blood sugars were elevated along with Hgb A1C and started on insulin. Discharge weight 344 lbs.   RHC (05/2014): Left sided filling pressures well compensated, mild pulm HTN with elevated right-sided pressures and mod/severely depressed CO. Discussion about trial of milrinone but patient wanted to hold off.  Admitted January 2017 with volume overload and cellulitis. Diuresed with IV lasix and required short term milrinone. Loaded on amio and had successful DC-CV on  10/17/2015.  Discharge weight was 307 pounds.   Post Hospital Follow up for Heart Failure: Overall feeling ok. Denies SOB/PND/Orthopnea. Weight at home 299. Trying to increase activity. Tries to follow low salt diet. Taking all medications. NoBRBPR.    05/03/12: EF 25-30%.  Grade 1 diastolic dysfunction.  Mild MR.  Mod dilated LA.   07/27/13: EF 40%, diff HK, MR mild, LA mild/mod dilated  03/2014: EF30-35%, grade II DD, mild MR, RV systolic fx mildly decreased 10/2014: EF 40%.  11/2015: EF ~40%. RV mildly dilated.    Labs  03/08/13 K 3.8 Creatinine 1.02  07/27/13 K 4.7 Creatinine 1.61 Dig level 0.9 ---> dig cut back to 0.125 mg every other day 10/10/13 K 3.7 Creatinine 1.4  03/17/14: K 3.8, Creatinine 1.8, BUN 29  (PCP office) 05/10/14: K 4.0 Cr 2.4 05/18/14: K 4.1, Cr 1.8, BUN 34, dig level 0.7,  06/06/14: K 4.2 Creatinine 1.34  08/21/2014: K 4.1 Creatinine 1.46 Dig level 0.7 10/19/2015: K 3.6 Creatinine 2.15    SH: Lives with son in [place]. Disabled. No ETOH or tobacco abuse  FH; Mother living: HT        Father deceased: was not part of his life so not sure health issues  ROS: All systems negative except as listed in HPI, PMH and Problem List.  Past Medical History  Diagnosis Date  . Nonischemic cardiomyopathy (HCC)     a. LHC (02/2011) normal coronaries  . Ventricular tachycardia [facility])     s/p MDT ICD implant  . Alcohol abuse     now quit  . Pulmonary hypertension (HCC)   . Diverticulosis of colon   . Hemorrhoids, internal   . Cholecystitis   . Morbid obesity (HCC)   . Obstructive sleep apnea   . Anemia     iron defi  . Hypertension   . Gout   . Diabetes mellitus   . Chronic systolic congestive heart failure (HCC)     a. RHC (02/2011) RA 31/27, RV 71/27, PA67/45, PCWP 46, PA 49%, Fick CO: 3.4 b. ECHO (03/2014) EF 30-35%, grade II DD, mild MR, RV poorly visualized appears mildly decreased c. RHC (05/2014) RA 13, RV 54/4/11, PA 60/22 (37), PCWP 17, Fick CO/CI: 4.4 / 1.9, Thermo CO/CI: 3.8 /1.6, PVR 5.2  WU, PA 57% and 59%  . Asthma   . Paroxysmal atrial fibrillation (HCC)   . Renal insufficiency     Current Outpatient Prescriptions  Medication Sig Dispense Refill  . acetaminophen (TYLENOL) 325 MG tablet Take 650 mg by mouth every 6 (six) hours as needed (pain). Reported on 10/11/2015    . albuterol (PROAIR HFA) 108 (90 BASE) MCG/ACT inhaler Inhale 2 puffs into the lungs every 6 (six) hours as needed. For wheezing or shortness of breath    . allopurinol (ZYLOPRIM) 100 MG tablet Take 3 tablets by mouth daily.     Marland Kitchen amiodarone (PACERONE) 200 MG tablet Take 400 mg by mouth daily.     Marland Kitchen atorvastatin (LIPITOR) 40 MG tablet Take 1 tablet (40 mg total) by mouth daily. 90 tablet 6  . bisoprolol  (ZEBETA) 10 MG tablet TAKE 1/2 TABLETS (5 MG TOTAL) BY MOUTH DAILY. 15 tablet 6  . colchicine 0.6 MG tablet Take 0.6 mg by mouth daily as needed. For gout    . digoxin (LANOXIN) 0.125 MG tablet Take 1 tablet (0.125 mg total) by mouth daily. (Patient taking differently: Take 0.0625 mg by mouth every other day. ) 30 tablet 0  . fluticasone (FLONASE) 50 MCG/ACT nasal spray Place 2 sprays into the nose daily as needed for allergies.     . Fluticasone-Salmeterol (ADVAIR DISKUS) 250-50 MCG/DOSE AEPB Inhale 1 puff into the lungs every 12 (twelve) hours as needed. For wheezing or shortness of breath    . hydrALAZINE (APRESOLINE) 50 MG tablet Take 50 mg by mouth 2 (two) times daily.    . insulin detemir (LEVEMIR) 100 UNIT/ML injection Inject 28 Units into the skin at bedtime.    . isosorbide mononitrate (IMDUR) 30 MG 24 hr tablet TAKE 1 TABLET (30 MG TOTAL) BY MOUTH 2 (TWO) TIMES DAILY. 60 tablet 3  . linagliptin (TRADJENTA) 5 MG TABS tablet Take 1 tablet (5 mg total) by mouth daily. 30 tablet 3  . metolazone (ZAROXOLYN) 2.5 MG tablet Take 2.5 mg by mouth daily as needed (edema).    . montelukast (SINGULAIR) 10 MG tablet Take 10 mg by mouth daily as needed. For shortness of breath or wheezing    . potassium chloride SA (K-DUR,KLOR-CON) 20 MEQ tablet Take 20 mEq by mouth daily.    . rivaroxaban (XARELTO) 20 MG TABS tablet Take 1 tablet (20 mg total) by mouth daily. 30 tablet 0  . torsemide (DEMADEX) 20 MG tablet Take 2 tablets (40 mg total) by mouth 2 (two) times daily. 60 tablet 0   No current facility-administered medications for this encounter.    Filed Vitals:   10/25/15 1144  BP: 122/70  Pulse: 74  Weight: 299 lb (135.626 kg)  SpO2: 95%    PHYSICAL EXAM: General:  Well appearing. No resp difficulty, NAD. Ambulated in the clinic without difficulty.  HEENT: normal Neck: Thick. JVP difficult to assess d/t body but appears mildly elevated; Carotids 2+ bilaterally; no bruits. No lymphadenopathy or  thryomegaly appreciated. Cor: PMI nonpalpable. Regular rate & rhythm. No rubs or gallops. 2/6 TR murmur. 2/6 MR Lungs: clear Abdomen: obese, soft, nontender, non-distended.  Good bowel sounds. Extremities: no cyanosis, clubbing, rash, tr-1+ edema. Neuro: alert & orientedx3, cranial nerves grossly intact. Moves all 4 extremities w/o difficulty. Affect pleasant.  EKG: NSR 63 bpm   ASSESSMENT & PLAN:   1) Chronic systolic HF: NICM s/p ICD [employer], EF 30-35% (03/2014).   - Overall doing well. NYHA II symptoms and volume status  stable. Continue torsemide 40 mg twice a day + 20 meq potassium daily.  -Continue bisoprolol 5 mg daily. HR too low to titrate  - Continue digoxin 0.0625 mg every other day. -Continue hydralazine 50 mg twice a day + Imdur 30 mg BID - Will not add ace due to CKD.  -Check BMET today.  - Reinforced the need and importance of daily weights, a low sodium diet, and fluid restriction (less than 2 L a day). Instructed to call the HF clinic if weight increases more than 3 lbs overnight or 5 lbs in a week.  2) CKD stage III -  Followed by nephrology. Creatinine baseline 1.8-2.1 Get BMET today.  3) HTN: controlled. Continue current medications 4) OSA - Continue to wear CPAP nightly 5) PAF-  S/P Succesful DC-CV 10/17/2015  New to patient 03/2014.  Recent admit back in A fib RVR and had successful DC/CV on 10/17/15. Today he is maintaining NSR. Cut back amio to 200 daily. Continue Xarelto 20.  Check TSH, LFTs next visit. Needs yearly eye exam and he is aware.  6) Obesity:  Needs to lose significant amount of weight. Encouraged to cut back on portions and try to lose more weight.   Follow up 6 weeks. Check dig level a that time. Linton Rump Clegg NP-C  10/25/2015 11:59 AM

## 2015-10-30 ENCOUNTER — Encounter: Payer: Self-pay | Admitting: Adult Health

## 2015-10-30 ENCOUNTER — Ambulatory Visit (INDEPENDENT_AMBULATORY_CARE_PROVIDER_SITE_OTHER): Payer: Medicare Other | Admitting: Adult Health

## 2015-10-30 VITALS — BP 116/64 | HR 71 | Temp 98.7°F | Ht 65.0 in | Wt 302.0 lb

## 2015-10-30 DIAGNOSIS — G4733 Obstructive sleep apnea (adult) (pediatric): Secondary | ICD-10-CM

## 2015-10-30 NOTE — Progress Notes (Signed)
Subjective:    Patient ID: Gary Hutchinson, male    DOB: July 06, 1961, 55 y.o.   MRN: [id number]  HPI   10/30/2015   Past Medical History  Diagnosis Date  . Nonischemic cardiomyopathy (HCC)     a. LHC (02/2011) normal coronaries  . Ventricular tachycardia [facility])     s/p MDT ICD implant  . Alcohol abuse     now quit  . Pulmonary hypertension (HCC)   . Diverticulosis of colon   . Hemorrhoids, internal   . Cholecystitis   . Morbid obesity (HCC)   . Obstructive sleep apnea   . Anemia     iron defi  . Hypertension   . Gout   . Diabetes mellitus   . Chronic systolic congestive heart failure (HCC)     a. RHC (02/2011) RA 31/27, RV 71/27, PA67/45, PCWP 46, PA 49%, Fick CO: 3.4 b. ECHO (03/2014) EF 30-35%, grade II DD, mild MR, RV poorly visualized appears mildly decreased c. RHC (05/2014) RA 13, RV 54/4/11, PA 60/22 (37), PCWP 17, Fick CO/CI: 4.4 / 1.9, Thermo CO/CI: 3.8 /1.6, PVR 5.2 WU, PA 57% and 59%  . Asthma   . Paroxysmal atrial fibrillation (HCC)   . Renal insufficiency    Current Outpatient Prescriptions on File Prior to Visit  Medication Sig Dispense Refill  . acetaminophen (TYLENOL) 325 MG tablet Take 650 mg by mouth every 6 (six) hours as needed (pain). Reported on 10/11/2015    . albuterol (PROAIR HFA) 108 (90 BASE) MCG/ACT inhaler Inhale 2 puffs into the lungs every 6 (six) hours as needed. For wheezing or shortness of breath    . allopurinol (ZYLOPRIM) 100 MG tablet Take 3 tablets by mouth daily.     Marland Kitchen amiodarone (PACERONE) 200 MG tablet Take 1 tablet (200 mg total) by mouth daily. 30 tablet 6  . atorvastatin (LIPITOR) 40 MG tablet Take 1 tablet (40 mg total) by mouth daily. 90 tablet 6  . bisoprolol (ZEBETA) 10 MG tablet TAKE 1/2 TABLETS (5 MG TOTAL) BY MOUTH DAILY. 15 tablet 6  . colchicine 0.6 MG tablet Take 0.6 mg by mouth daily as needed. For gout    . digoxin (LANOXIN) 0.125 MG tablet Take 1 tablet (0.125 mg total) by mouth daily. (Patient taking differently: Take  0.0625 mg by mouth every other day. ) 30 tablet 0  . fluticasone (FLONASE) 50 MCG/ACT nasal spray Place 2 sprays into the nose daily as needed for allergies.     . Fluticasone-Salmeterol (ADVAIR DISKUS) 250-50 MCG/DOSE AEPB Inhale 1 puff into the lungs every 12 (twelve) hours as needed. For wheezing or shortness of breath    . hydrALAZINE (APRESOLINE) 50 MG tablet Take 50 mg by mouth 2 (two) times daily.    . insulin detemir (LEVEMIR) 100 UNIT/ML injection Inject 28 Units into the skin at bedtime.    . isosorbide mononitrate (IMDUR) 30 MG 24 hr tablet TAKE 1 TABLET (30 MG TOTAL) BY MOUTH 2 (TWO) TIMES DAILY. 60 tablet 3  . linagliptin (TRADJENTA) 5 MG TABS tablet Take 1 tablet (5 mg total) by mouth daily. 30 tablet 3  . metolazone (ZAROXOLYN) 2.5 MG tablet Take 2.5 mg by mouth daily as needed (edema).    . montelukast (SINGULAIR) 10 MG tablet Take 10 mg by mouth daily as needed. For shortness of breath or wheezing    . potassium chloride SA (K-DUR,KLOR-CON) 20 MEQ tablet Take 40  Meq(2 tabs) in the AM and 20 meQ (1 tab)in  the PM 90 tablet 3  . rivaroxaban (XARELTO) 20 MG TABS tablet Take 1 tablet (20 mg total) by mouth daily. 30 tablet 0  . torsemide (DEMADEX) 20 MG tablet Take 2 tablets (40 mg total) by mouth 2 (two) times daily. 60 tablet 0   No current facility-administered medications on file prior to visit.    Review of Systems     Objective:   Physical Exam        Assessment & Plan:

## 2015-10-30 NOTE — Addendum Note (Signed)
Addended by: Karalee Height on: 10/30/2015 11:11 AM   Modules accepted: Orders

## 2015-10-30 NOTE — Assessment & Plan Note (Signed)
Severe OSA controlled on BIPAP  Encouraged to wear >4hr  New supplies ordered.   Plan  Continue on BIPAP At bedtime   Need to wear for at least 4hrs each night  Do not drive if sleepy.  Work on weight loss.  Supplies ordered.  Follow up Dr. Vassie Loll  In 1 year and As needed

## 2015-10-30 NOTE — Patient Instructions (Signed)
Continue on BIPAP At bedtime   Need to wear for at least 4hrs each night  Do not drive if sleepy.  Work on weight loss.  Supplies ordered.  Follow up Dr. Vassie Loll  In 1 year and As needed

## 2015-10-30 NOTE — Assessment & Plan Note (Signed)
Wt loss encouraged  

## 2015-10-30 NOTE — Progress Notes (Signed)
Subjective:    Patient ID: Gary Hutchinson, male    DOB: 03/11/1961, 55 y.o.   MRN: [id number]  HPI 55 yo morbidly obese male with very severe OSA on BIPAP   TEST  2002 NPSG>AHI 117   10/30/2015 Follow up : OSA  Pt returns for 6 month follow up for OSA Reviewed BIPAP Download -shows good compliance w/ good compliance , avg usage ~3hr . AHI 1.1 Mild leaks  Says only wearing for few hours because mask is old. DME will not give new supplies without office visit.  On BIPAP , 17/13 IPAP/EPAP.  Supplies ordered.   Recent admission hospital earlier this month for Acute on Chronic CHF with Atrial Flutter s/p cardioversion . Records reviewed . Echo showed EF 40%, PAP 67 Feels some better w/ less dyspnea.   Under stress with loss of mother and aunt.     Past Medical History  Diagnosis Date  . Nonischemic cardiomyopathy (HCC)     a. LHC (02/2011) normal coronaries  . Ventricular tachycardia [facility])     s/p MDT ICD implant  . Alcohol abuse     now quit  . Pulmonary hypertension (HCC)   . Diverticulosis of colon   . Hemorrhoids, internal   . Cholecystitis   . Morbid obesity (HCC)   . Obstructive sleep apnea   . Anemia     iron defi  . Hypertension   . Gout   . Diabetes mellitus   . Chronic systolic congestive heart failure (HCC)     a. RHC (02/2011) RA 31/27, RV 71/27, PA67/45, PCWP 46, PA 49%, Fick CO: 3.4 b. ECHO (03/2014) EF 30-35%, grade II DD, mild MR, RV poorly visualized appears mildly decreased c. RHC (05/2014) RA 13, RV 54/4/11, PA 60/22 (37), PCWP 17, Fick CO/CI: 4.4 / 1.9, Thermo CO/CI: 3.8 /1.6, PVR 5.2 WU, PA 57% and 59%  . Asthma   . Paroxysmal atrial fibrillation (HCC)   . Renal insufficiency    Current Outpatient Prescriptions on File Prior to Visit  Medication Sig Dispense Refill  . acetaminophen (TYLENOL) 325 MG tablet Take 650 mg by mouth every 6 (six) hours as needed (pain). Reported on 10/11/2015    . albuterol (PROAIR HFA) 108 (90 BASE) MCG/ACT inhaler Inhale 2  puffs into the lungs every 6 (six) hours as needed. For wheezing or shortness of breath    . allopurinol (ZYLOPRIM) 100 MG tablet Take 3 tablets by mouth daily.     Marland Kitchen amiodarone (PACERONE) 200 MG tablet Take 1 tablet (200 mg total) by mouth daily. 30 tablet 6  . atorvastatin (LIPITOR) 40 MG tablet Take 1 tablet (40 mg total) by mouth daily. 90 tablet 6  . bisoprolol (ZEBETA) 10 MG tablet TAKE 1/2 TABLETS (5 MG TOTAL) BY MOUTH DAILY. 15 tablet 6  . colchicine 0.6 MG tablet Take 0.6 mg by mouth daily as needed. For gout    . digoxin (LANOXIN) 0.125 MG tablet Take 1 tablet (0.125 mg total) by mouth daily. (Patient taking differently: Take 0.0625 mg by mouth every other day. ) 30 tablet 0  . fluticasone (FLONASE) 50 MCG/ACT nasal spray Place 2 sprays into the nose daily as needed for allergies.     . Fluticasone-Salmeterol (ADVAIR DISKUS) 250-50 MCG/DOSE AEPB Inhale 1 puff into the lungs every 12 (twelve) hours as needed. For wheezing or shortness of breath    . hydrALAZINE (APRESOLINE) 50 MG tablet Take 50 mg by mouth 2 (two) times daily.    Marland Kitchen  insulin detemir (LEVEMIR) 100 UNIT/ML injection Inject 28 Units into the skin at bedtime.    . isosorbide mononitrate (IMDUR) 30 MG 24 hr tablet TAKE 1 TABLET (30 MG TOTAL) BY MOUTH 2 (TWO) TIMES DAILY. 60 tablet 3  . linagliptin (TRADJENTA) 5 MG TABS tablet Take 1 tablet (5 mg total) by mouth daily. 30 tablet 3  . metolazone (ZAROXOLYN) 2.5 MG tablet Take 2.5 mg by mouth daily as needed (edema).    . montelukast (SINGULAIR) 10 MG tablet Take 10 mg by mouth daily as needed. For shortness of breath or wheezing    . potassium chloride SA (K-DUR,KLOR-CON) 20 MEQ tablet Take 40  Meq(2 tabs) in the AM and 20 meQ (1 tab)in the PM 90 tablet 3  . rivaroxaban (XARELTO) 20 MG TABS tablet Take 1 tablet (20 mg total) by mouth daily. 30 tablet 0  . torsemide (DEMADEX) 20 MG tablet Take 2 tablets (40 mg total) by mouth 2 (two) times daily. 60 tablet 0   No current  facility-administered medications on file prior to visit.    Review of Systems Constitutional:   No  weight loss, night sweats,  Fevers, chills,  +fatigue, or  lassitude.  HEENT:   No headaches,  Difficulty swallowing,  Tooth/dental problems, or  Sore throat,                No sneezing, itching, ear ache, nasal congestion, post nasal drip,   CV:  No chest pain,  Orthopnea, PND, swelling in lower extremities, anasarca, dizziness, palpitations, syncope.   GI  No heartburn, indigestion, abdominal pain, nausea, vomiting, diarrhea, change in bowel habits, loss of appetite, bloody stools.   Resp:    No chest wall deformity  Skin: no rash or lesions.  GU: no dysuria, change in color of urine, no urgency or frequency.  No flank pain, no hematuria   MS:  No joint pain or swelling.  No decreased range of motion.  No back pain.  Psych:  No change in mood or affect. No depression or anxiety.  No memory loss.         Objective:   Physical Exam Filed Vitals:   10/30/15 0931  BP: 116/64  Pulse: 71  Temp: 98.7 F (37.1 C)  TempSrc: Oral  Height: 5\' 5"  (1.651 m)  Weight: 302 lb (136.986 kg)  SpO2: 97%  Body mass index is 50.26 kg/(m^2).   GEN: A/Ox3; pleasant , NAD, morbidly obese   HEENT:  [place]/AT,  EACs-clear, TMs-wnl, NOSE-clear, THROAT-clear, no lesions, no postnasal drip or exudate noted. Class 2 MP airway   NECK:  Supple w/ fair ROM; no JVD; normal carotid impulses w/o bruits; no thyromegaly or nodules palpated; no lymphadenopathy.  RESP  Clear  P & A; w/o, wheezes/ rales/ or rhonchi.no accessory muscle use, no dullness to percussion  CARD:  RRR, no m/r/g  , no peripheral edema, pulses intact, no cyanosis or clubbing.  GI:   Soft & nt; nml bowel sounds; no organomegaly or masses detected.  Musco: Warm bil, no deformities or joint swelling noted.   Neuro: alert, no focal deficits noted.    Skin: Warm, no lesions or rashes        Assessment & Plan:

## 2015-11-05 ENCOUNTER — Encounter: Payer: Self-pay | Admitting: Internal Medicine

## 2015-11-05 NOTE — Progress Notes (Signed)
Reviewed & agree with plan  

## 2015-11-09 ENCOUNTER — Other Ambulatory Visit (INDEPENDENT_AMBULATORY_CARE_PROVIDER_SITE_OTHER): Payer: Medicare Other | Admitting: *Deleted

## 2015-11-09 DIAGNOSIS — I472 Ventricular tachycardia, unspecified: Secondary | ICD-10-CM

## 2015-11-09 DIAGNOSIS — Z95810 Presence of automatic (implantable) cardiac defibrillator: Secondary | ICD-10-CM

## 2015-11-09 DIAGNOSIS — I5022 Chronic systolic (congestive) heart failure: Secondary | ICD-10-CM

## 2015-11-09 LAB — BASIC METABOLIC PANEL
BUN: 35 mg/dL — ABNORMAL HIGH (ref 7–25)
CALCIUM: 9.2 mg/dL (ref 8.6–10.3)
CO2: 27 mmol/L (ref 20–31)
Chloride: 97 mmol/L — ABNORMAL LOW (ref 98–110)
Creat: 1.69 mg/dL — ABNORMAL HIGH (ref 0.70–1.33)
Glucose, Bld: 157 mg/dL — ABNORMAL HIGH (ref 65–99)
POTASSIUM: 3.9 mmol/L (ref 3.5–5.3)
SODIUM: 136 mmol/L (ref 135–146)

## 2015-11-09 LAB — CBC WITH DIFFERENTIAL/PLATELET
BASOS PCT: 1 % (ref 0–1)
Basophils Absolute: 0.1 10*3/uL (ref 0.0–0.1)
EOS PCT: 7 % — AB (ref 0–5)
Eosinophils Absolute: 0.4 10*3/uL (ref 0.0–0.7)
HEMATOCRIT: 42.5 % (ref 39.0–52.0)
HEMOGLOBIN: 13.5 g/dL (ref 13.0–17.0)
LYMPHS PCT: 27 % (ref 12–46)
Lymphs Abs: 1.6 10*3/uL (ref 0.7–4.0)
MCH: 27.4 pg (ref 26.0–34.0)
MCHC: 31.8 g/dL (ref 30.0–36.0)
MCV: 86.4 fL (ref 78.0–100.0)
MONO ABS: 0.6 10*3/uL (ref 0.1–1.0)
MONOS PCT: 10 % (ref 3–12)
MPV: 11.4 fL (ref 8.6–12.4)
NEUTROS ABS: 3.4 10*3/uL (ref 1.7–7.7)
Neutrophils Relative %: 55 % (ref 43–77)
Platelets: 255 10*3/uL (ref 150–400)
RBC: 4.92 MIL/uL (ref 4.22–5.81)
RDW: 18.1 % — AB (ref 11.5–15.5)
WBC: 6.1 10*3/uL (ref 4.0–10.5)

## 2015-11-16 ENCOUNTER — Encounter (HOSPITAL_COMMUNITY): Payer: Self-pay | Admitting: Internal Medicine

## 2015-11-16 ENCOUNTER — Encounter (HOSPITAL_COMMUNITY)
Admission: RE | Disposition: A | Discharge status: Home / Self Care | Payer: Self-pay | Source: Ambulatory Visit | Attending: Internal Medicine

## 2015-11-16 ENCOUNTER — Ambulatory Visit (HOSPITAL_COMMUNITY)
Admission: RE | Admit: 2015-11-16 | Discharge: 2015-11-17 | Disposition: A | Discharge status: Home / Self Care | Payer: Medicare Other | Source: Ambulatory Visit | Attending: Internal Medicine | Admitting: Internal Medicine

## 2015-11-16 DIAGNOSIS — I130 Hypertensive heart and chronic kidney disease with heart failure and stage 1 through stage 4 chronic kidney disease, or unspecified chronic kidney disease: Secondary | ICD-10-CM | POA: Insufficient documentation

## 2015-11-16 DIAGNOSIS — I447 Left bundle-branch block, unspecified: Secondary | ICD-10-CM | POA: Diagnosis not present

## 2015-11-16 DIAGNOSIS — I480 Paroxysmal atrial fibrillation: Secondary | ICD-10-CM | POA: Diagnosis not present

## 2015-11-16 DIAGNOSIS — M109 Gout, unspecified: Secondary | ICD-10-CM | POA: Insufficient documentation

## 2015-11-16 DIAGNOSIS — Z79899 Other long term (current) drug therapy: Secondary | ICD-10-CM | POA: Diagnosis not present

## 2015-11-16 DIAGNOSIS — Z7901 Long term (current) use of anticoagulants: Secondary | ICD-10-CM | POA: Insufficient documentation

## 2015-11-16 DIAGNOSIS — J45909 Unspecified asthma, uncomplicated: Secondary | ICD-10-CM | POA: Diagnosis not present

## 2015-11-16 DIAGNOSIS — G4733 Obstructive sleep apnea (adult) (pediatric): Secondary | ICD-10-CM | POA: Insufficient documentation

## 2015-11-16 DIAGNOSIS — N183 Chronic kidney disease, stage 3 (moderate): Secondary | ICD-10-CM | POA: Diagnosis not present

## 2015-11-16 DIAGNOSIS — I429 Cardiomyopathy, unspecified: Secondary | ICD-10-CM | POA: Diagnosis not present

## 2015-11-16 DIAGNOSIS — Z4502 Encounter for adjustment and management of automatic implantable cardiac defibrillator: Secondary | ICD-10-CM | POA: Diagnosis not present

## 2015-11-16 DIAGNOSIS — I428 Other cardiomyopathies: Secondary | ICD-10-CM | POA: Diagnosis not present

## 2015-11-16 DIAGNOSIS — Z6841 Body Mass Index (BMI) 40.0 and over, adult: Secondary | ICD-10-CM | POA: Diagnosis not present

## 2015-11-16 DIAGNOSIS — I5022 Chronic systolic (congestive) heart failure: Secondary | ICD-10-CM | POA: Diagnosis not present

## 2015-11-16 DIAGNOSIS — E1122 Type 2 diabetes mellitus with diabetic chronic kidney disease: Secondary | ICD-10-CM | POA: Insufficient documentation

## 2015-11-16 DIAGNOSIS — I272 Other secondary pulmonary hypertension: Secondary | ICD-10-CM | POA: Diagnosis not present

## 2015-11-16 DIAGNOSIS — I472 Ventricular tachycardia: Secondary | ICD-10-CM | POA: Diagnosis not present

## 2015-11-16 DIAGNOSIS — I509 Heart failure, unspecified: Secondary | ICD-10-CM

## 2015-11-16 DIAGNOSIS — Z959 Presence of cardiac and vascular implant and graft, unspecified: Secondary | ICD-10-CM

## 2015-11-16 HISTORY — DX: Obstructive sleep apnea (adult) (pediatric): G47.33

## 2015-11-16 HISTORY — DX: Hyperlipidemia, unspecified: E78.5

## 2015-11-16 HISTORY — DX: Pneumonia, unspecified organism: J18.9

## 2015-11-16 HISTORY — PX: EP IMPLANTABLE DEVICE: SHX172B

## 2015-11-16 HISTORY — DX: Type 2 diabetes mellitus without complications: E11.9

## 2015-11-16 HISTORY — DX: Unspecified osteoarthritis, unspecified site: M19.90

## 2015-11-16 HISTORY — DX: Dependence on other enabling machines and devices: Z99.89

## 2015-11-16 HISTORY — DX: Presence of automatic (implantable) cardiac defibrillator: Z95.810

## 2015-11-16 LAB — SURGICAL PCR SCREEN
MRSA, PCR: NEGATIVE
STAPHYLOCOCCUS AUREUS: NEGATIVE

## 2015-11-16 LAB — GLUCOSE, CAPILLARY
GLUCOSE-CAPILLARY: 190 mg/dL — AB (ref 65–99)
GLUCOSE-CAPILLARY: 148 mg/dL — AB (ref 65–99)
GLUCOSE-CAPILLARY: 202 mg/dL — AB (ref 65–99)
Glucose-Capillary: 170 mg/dL — ABNORMAL HIGH (ref 65–99)

## 2015-11-16 SURGERY — BIV UPGRADE
Anesthesia: LOCAL

## 2015-11-16 MED ORDER — SODIUM CHLORIDE 0.9 % IV SOLN: 250.0000 mL | INTRAVENOUS | Status: DC | PRN

## 2015-11-16 MED ORDER — MUPIROCIN 2 % EX OINT
TOPICAL_OINTMENT | Freq: Two times a day (BID) | CUTANEOUS | Status: DC
  Filled 2015-11-16: qty 22

## 2015-11-16 MED ORDER — ACETAMINOPHEN 325 MG PO TABS
325.0000 mg | ORAL_TABLET | ORAL | Status: DC | PRN
  Administered 2015-11-16: 13:00:00 650 mg via ORAL
  Filled 2015-11-16: qty 2

## 2015-11-16 MED ORDER — AMIODARONE HCL 200 MG PO TABS
200.0000 mg | ORAL_TABLET | Freq: Every day | ORAL | Status: DC
  Administered 2015-11-16: 13:00:00 200 mg via ORAL
  Filled 2015-11-16: qty 1

## 2015-11-16 MED ORDER — POTASSIUM CHLORIDE CRYS ER 20 MEQ PO TBCR
40.0000 meq | EXTENDED_RELEASE_TABLET | Freq: Every day | ORAL | Status: DC
  Administered 2015-11-16 – 2015-11-17 (×2): 40 meq via ORAL
  Filled 2015-11-16 (×2): qty 2

## 2015-11-16 MED ORDER — HYDROCODONE-ACETAMINOPHEN 5-325 MG PO TABS
1.0000 | ORAL_TABLET | ORAL | Status: DC | PRN
Start: 2015-11-16 — End: 2015-11-17
  Administered 2015-11-16 (×2): 1 via ORAL
  Administered 2015-11-17: 08:00:00 2 via ORAL
  Administered 2015-11-17: 1 via ORAL
  Filled 2015-11-16: qty 1
  Filled 2015-11-16: qty 2
  Filled 2015-11-16: qty 1
  Filled 2015-11-16: qty 2

## 2015-11-16 MED ORDER — MOMETASONE FURO-FORMOTEROL FUM 100-5 MCG/ACT IN AERO
2.0000 | INHALATION_SPRAY | Freq: Two times a day (BID) | RESPIRATORY_TRACT | Status: DC
  Filled 2015-11-16: qty 8.8

## 2015-11-16 MED ORDER — MUPIROCIN 2 % EX OINT
TOPICAL_OINTMENT | CUTANEOUS | Status: AC
  Filled 2015-11-16: qty 22

## 2015-11-16 MED ORDER — SPIRONOLACTONE 12.5 MG HALF TABLET
12.5000 mg | ORAL_TABLET | Freq: Every day | ORAL | Status: DC
  Filled 2015-11-16: qty 1

## 2015-11-16 MED ORDER — SODIUM CHLORIDE 0.9% FLUSH: 3.0000 mL | INTRAVENOUS | Status: DC | PRN

## 2015-11-16 MED ORDER — INSULIN ASPART 100 UNIT/ML ~~LOC~~ SOLN
0.0000 [IU] | Freq: Three times a day (TID) | SUBCUTANEOUS | Status: DC
  Administered 2015-11-16 – 2015-11-17 (×2): 2 [IU] via SUBCUTANEOUS

## 2015-11-16 MED ORDER — SODIUM CHLORIDE 0.9 % IV SOLN
INTRAVENOUS | Status: DC
  Administered 2015-11-16: 07:00:00 via INTRAVENOUS

## 2015-11-16 MED ORDER — HYDRALAZINE HCL 50 MG PO TABS
50.0000 mg | ORAL_TABLET | Freq: Two times a day (BID) | ORAL | Status: DC
  Administered 2015-11-16 (×2): 50 mg via ORAL
  Filled 2015-11-16 (×2): qty 1

## 2015-11-16 MED ORDER — CHLORHEXIDINE GLUCONATE 4 % EX LIQD
60.0000 mL | Freq: Once | CUTANEOUS | Status: DC
  Filled 2015-11-16: qty 60

## 2015-11-16 MED ORDER — HEPARIN (PORCINE) IN NACL 2-0.9 UNIT/ML-% IJ SOLN
INTRAMUSCULAR | Status: AC
  Filled 2015-11-16: qty 500

## 2015-11-16 MED ORDER — HEPARIN (PORCINE) IN NACL 2-0.9 UNIT/ML-% IJ SOLN
INTRAMUSCULAR | Status: DC | PRN
  Administered 2015-11-16: 08:00:00

## 2015-11-16 MED ORDER — SODIUM CHLORIDE 0.9% FLUSH
3.0000 mL | Freq: Two times a day (BID) | INTRAVENOUS | Status: DC
  Administered 2015-11-16: 3 mL via INTRAVENOUS

## 2015-11-16 MED ORDER — LIDOCAINE HCL (PF) 1 % IJ SOLN
INTRAMUSCULAR | Status: DC | PRN
  Administered 2015-11-16: 31 mL via INTRADERMAL

## 2015-11-16 MED ORDER — YOU HAVE A PACEMAKER BOOK
Freq: Once | Status: AC
  Administered 2015-11-16: 23:00:00
  Filled 2015-11-16: qty 1

## 2015-11-16 MED ORDER — MIDAZOLAM HCL 5 MG/5ML IJ SOLN
INTRAMUSCULAR | Status: AC
  Filled 2015-11-16: qty 5

## 2015-11-16 MED ORDER — DEXTROSE 5 % IV SOLN
3.0000 g | INTRAVENOUS | Status: AC
  Administered 2015-11-16: 3 g via INTRAVENOUS
  Filled 2015-11-16: qty 3000

## 2015-11-16 MED ORDER — SODIUM CHLORIDE 0.9 % IR SOLN
Status: AC
  Filled 2015-11-16: qty 2

## 2015-11-16 MED ORDER — MIDAZOLAM HCL 5 MG/5ML IJ SOLN
INTRAMUSCULAR | Status: DC | PRN
  Administered 2015-11-16: 1 mg via INTRAVENOUS

## 2015-11-16 MED ORDER — ONDANSETRON HCL 4 MG/2ML IJ SOLN: 4.0000 mg | Freq: Four times a day (QID) | INTRAMUSCULAR | Status: DC | PRN

## 2015-11-16 MED ORDER — LIDOCAINE HCL (PF) 1 % IJ SOLN
INTRAMUSCULAR | Status: AC
  Filled 2015-11-16: qty 60

## 2015-11-16 MED ORDER — FENTANYL CITRATE (PF) 100 MCG/2ML IJ SOLN
INTRAMUSCULAR | Status: DC | PRN
  Administered 2015-11-16: 25 ug via INTRAVENOUS

## 2015-11-16 MED ORDER — CEFAZOLIN SODIUM 1-5 GM-% IV SOLN
1.0000 g | Freq: Four times a day (QID) | INTRAVENOUS | Status: AC
  Administered 2015-11-16 – 2015-11-17 (×3): 1 g via INTRAVENOUS
  Filled 2015-11-16 (×3): qty 50

## 2015-11-16 MED ORDER — CEFAZOLIN SODIUM 1-5 GM-% IV SOLN
INTRAVENOUS | Status: AC
  Filled 2015-11-16: qty 50

## 2015-11-16 MED ORDER — HEPARIN (PORCINE) IN NACL 2-0.9 UNIT/ML-% IJ SOLN
INTRAMUSCULAR | Status: DC | PRN
  Administered 2015-11-16: 10:00:00

## 2015-11-16 MED ORDER — FENTANYL CITRATE (PF) 100 MCG/2ML IJ SOLN
INTRAMUSCULAR | Status: AC
  Filled 2015-11-16: qty 2

## 2015-11-16 MED ORDER — SODIUM CHLORIDE 0.9 % IR SOLN
80.0000 mg | Status: AC
  Administered 2015-11-16: 80 mg
  Filled 2015-11-16: qty 2

## 2015-11-16 MED ORDER — INSULIN DETEMIR 100 UNIT/ML ~~LOC~~ SOLN
28.0000 [IU] | Freq: Every day | SUBCUTANEOUS | Status: DC
  Administered 2015-11-16: 28 [IU] via SUBCUTANEOUS
  Filled 2015-11-16 (×2): qty 0.28

## 2015-11-16 MED ORDER — LINAGLIPTIN 5 MG PO TABS
5.0000 mg | ORAL_TABLET | Freq: Every day | ORAL | Status: DC
  Administered 2015-11-16: 5 mg via ORAL
  Filled 2015-11-16: qty 1

## 2015-11-16 MED ORDER — IOHEXOL 350 MG/ML SOLN
INTRAVENOUS | Status: DC | PRN
  Administered 2015-11-16: 15 mL via INTRAVENOUS
  Administered 2015-11-16: 20 mL via INTRACARDIAC

## 2015-11-16 MED ORDER — TORSEMIDE 20 MG PO TABS
40.0000 mg | ORAL_TABLET | Freq: Two times a day (BID) | ORAL | Status: DC
  Administered 2015-11-16: 13:00:00 40 mg via ORAL
  Filled 2015-11-16 (×4): qty 2

## 2015-11-16 MED ORDER — CEFAZOLIN SODIUM-DEXTROSE 2-3 GM-% IV SOLR
INTRAVENOUS | Status: AC
  Filled 2015-11-16: qty 50

## 2015-11-16 SURGICAL SUPPLY — 22 items
ADAPTER SEALING SSA-EW-09 (MISCELLANEOUS) ×1 IMPLANT
ADPR INTRO LNG 9FR SL XTD WNG (MISCELLANEOUS) ×1
CABLE SURGICAL S-101-97-12 (CABLE) ×1 IMPLANT
CATH ATTAIN COM SURV 6250V-EH (CATHETERS) ×1 IMPLANT
CATH ATTAIN COM SURV 6250V-MB2 (CATHETERS) ×1 IMPLANT
CATH ATTAIN COMMAND 6250-AM (CATHETERS) ×1 IMPLANT
CATH HEXAPOLAR DAMATO 6F (CATHETERS) ×1 IMPLANT
DEVICE DISSECT PLASMABLAD 3.0S (MISCELLANEOUS) IMPLANT
GUIDEWIRE ANGLED .035X150CM (WIRE) ×1 IMPLANT
ICD VIVA QUAD XT CRT-D DTBA1Q1 (ICD Generator) ×1 IMPLANT
KIT ESSENTIALS PG (KITS) ×1 IMPLANT
LEAD ATTAIN PERFORMA S 4598-88 (Lead) ×1 IMPLANT
LEAD CAPSURE NOVUS 5076-52CM (Lead) ×1 IMPLANT
PAD DEFIB LIFELINK (PAD) ×1 IMPLANT
PLASMABLADE 3.0S (MISCELLANEOUS) ×2
SET INTRODUCER MICROPUNCT 5F (INTRODUCER) ×1 IMPLANT
SHEATH CLASSIC 7F (SHEATH) ×1 IMPLANT
SHEATH CLASSIC 9.5F (SHEATH) ×1 IMPLANT
SHEATH PINNACLE 6F 10CM (SHEATH) ×1 IMPLANT
SLITTER 6232ADJ (MISCELLANEOUS) ×1 IMPLANT
TRAY PACEMAKER INSERTION (PACKS) ×1 IMPLANT
WIRE ACUITY WHISPER EDS 4648 (WIRE) ×1 IMPLANT

## 2015-11-16 NOTE — Discharge Instructions (Signed)
° ° °  Supplemental Discharge Instructions for  Pacemaker/Defibrillator Patients  Activity No heavy lifting or vigorous activity with your left/right arm for 6 to 8 weeks.  Do not raise your left/right arm above your head for one week.  Gradually raise your affected arm as drawn below.           __        11/20/15                   11/21/15                   11/22/15                  11/23/15  NO DRIVING for  1 week   ; you may begin driving on  6/78/93   .  WOUND CARE - Keep the wound area clean and dry.  Do not get this area wet for one week. No showers for one week; you may shower on  11/23/15   . - The tape/steri-strips on your wound will fall off; do not pull them off.  No bandage is needed on the site.  DO  NOT apply any creams, oils, or ointments to the wound area. - If you notice any drainage or discharge from the wound, any swelling or bruising at the site, or you develop a fever > 101? F after you are discharged home, call the office at once.  Special Instructions - You are still able to use cellular telephones; use the ear opposite the side where you have your pacemaker/defibrillator.  Avoid carrying your cellular phone near your device. - When traveling through airports, show security personnel your identification card to avoid being screened in the metal detectors.  Ask the security personnel to use the hand wand. - Avoid arc welding equipment, MRI testing (magnetic resonance imaging), TENS units (transcutaneous nerve stimulators).  Call the office for questions about other devices. - Avoid electrical appliances that are in poor condition or are not properly grounded. - Microwave ovens are safe to be near or to operate.  Additional information for defibrillator patients should your device go off: - If your device goes off ONCE and you feel fine afterward, notify the device clinic nurses. - If your device goes off ONCE and you do not feel well afterward, call 911. - If your device  goes off TWICE, call 911. - If your device goes off THREE times in one day, call 911.  DO NOT DRIVE YOURSELF OR A FAMILY MEMBER WITH A DEFIBRILLATOR TO THE HOSPITAL--CALL 911.

## 2015-11-16 NOTE — H&P (Signed)
Chief Complaint  Patient presents with  . PAF  . VT  . Acute on chronic systolic heart failure  . Chronic systolic CHF    History of Present Illness: Gary Hutchinson is a 55 y.o. male who presents today for ICD upgrade to CRT. Doing well at this time. Denies any recent ICD shocks. Reports edema and SOB occasionally but feels at him baseline presently. Remains active. Trying to lose weight but without much success. He has reached ERI battery status.  Recently hospitalized with CHF.  Today, he denies symptoms of palpitations, chest pain, orthopnea, PND, claudication, dizziness, presyncope, syncope, bleeding, or neurologic sequela. The patient is tolerating medications without difficulties and is otherwise without complaint today.    Past Medical History  Diagnosis Date  . Nonischemic cardiomyopathy (HCC)     a. LHC (02/2011) normal coronaries  . Ventricular tachycardia [facility])     s/p MDT ICD implant  . Alcohol abuse     now quit  . Pulmonary hypertension (HCC)   . Diverticulosis of colon   . Hemorrhoids, internal   . Cholecystitis   . Morbid obesity (HCC)   . Obstructive sleep apnea   . Anemia     iron defi  . Hypertension   . Gout   . Diabetes mellitus   . Chronic systolic congestive heart failure (HCC)     a. RHC (02/2011) RA 31/27, RV 71/27, PA67/45, PCWP 46, PA 49%, Fick CO: 3.4 b. ECHO (03/2014) EF 30-35%, grade II DD, mild MR, RV poorly visualized appears mildly decreased c. RHC (05/2014) RA 13, RV 54/4/11, PA 60/22 (37), PCWP 17, Fick CO/CI: 4.4 / 1.9, Thermo CO/CI: 3.8 /1.6, PVR 5.2 WU, PA 57% and 59%  . Asthma   . Paroxysmal atrial fibrillation [facility])    Past Surgical History  Procedure Laterality Date  . Icd implantation      ICD- Med[employer]onic 9/09  . Cardiac catheterization  03/08/2004    EF 25-30%  . US echocardiography  08/22/2008    EF 30-35%  .  Transthoracic echocardiogram  05/27/2008    EF 30-35%  . Right heart catheterization N/A 05/23/2014    Procedure: RIGHT HEART CATH; Surgeon: [name], MD; Location: [facility] CATH LAB; Service: Cardiovascular; Laterality: N/A;     Current Outpatient Prescriptions  Medication Sig Dispense Refill  . acetaminophen (TYLENOL) 325 MG tablet Take 650 mg by mouth every 6 (six) hours as needed (pain).    Marland Kitchen albuterol (PROAIR HFA) 108 (90 BASE) MCG/ACT inhaler Inhale 2 puffs into the lungs every 6 (six) hours as needed. For wheezing or shortness of breath    . allopurinol (ZYLOPRIM) 100 MG tablet Take 3 tablets by mouth daily.     Marland Kitchen amiodarone (PACERONE) 100 MG tablet Take 1 tablet (100 mg total) by mouth daily. 30 tablet 3  . atorvastatin (LIPITOR) 40 MG tablet Take 1 tablet (40 mg total) by mouth daily. 90 tablet 6  . bisoprolol (ZEBETA) 10 MG tablet TAKE 1/2 TABLETS (5 MG TOTAL) BY MOUTH DAILY. 15 tablet 6  . colchicine 0.6 MG tablet Take 0.6 mg by mouth daily as needed. Gout flare ups    . digoxin (LANOXIN) 0.25 MG tablet TAKE 0.5 TABLETS (125 MCG TOTAL) BY MOUTH EVERY OTHER DAY. 15 tablet 5  . fluticasone (FLONASE) 50 MCG/ACT nasal spray Place 2 sprays into the nose daily as needed for allergies.     . Fluticasone-Salmeterol (ADVAIR DISKUS) 250-50 MCG/DOSE AEPB Inhale 1 puff into the lungs every  12 (twelve) hours as needed. For wheezing or shortness of breath    . hydrALAZINE (APRESOLINE) 50 MG tablet Take 50 mg by mouth 2 (two) times daily.    . insulin detemir (LEVEMIR) 100 UNIT/ML injection Inject 28 Units into the skin at bedtime.    . isosorbide mononitrate (IMDUR) 30 MG 24 hr tablet TAKE 1 TABLET (30 MG TOTAL) BY MOUTH 2 (TWO) TIMES DAILY. 60 tablet 3  . linagliptin (TRADJENTA) 5 MG TABS tablet Take 1 tablet (5 mg total) by mouth daily. 30 tablet 3  . metolazone (ZAROXOLYN) 2.5 MG tablet Take 2.5  mg by mouth daily as needed (edema).    . montelukast (SINGULAIR) 10 MG tablet Take 10 mg by mouth daily as needed. For shortness of breath or wheezing    . potassium chloride SA (K-DUR,KLOR-CON) 20 MEQ tablet Take 20 mEq by mouth daily.    . rivaroxaban (XARELTO) 15 MG TABS tablet Take 1 tablet (15 mg total) by mouth daily. 30 tablet 6  . spironolactone (ALDACTONE) 25 MG tablet Take 0.5 tablets (12.5 mg total) by mouth daily. 15 tablet 3  . torsemide (DEMADEX) 20 MG tablet TAKE 40 MG (2 TABLETS) EVERY MORNING AND 20 MG (1 TABLET) EVERY EVENING 120 tablet 3  . traMADol (ULTRAM) 50 MG tablet Take 50 mg by mouth every 8 (eight) hours as needed (pain).     No current facility-administered medications for this visit.    Allergies: Review of patient's allergies indicates no known allergies.   Social History: The patient  reports that he has never smoked. He does not have any smokeless tobacco history on file. He reports that he does not drink alcohol or use illicit drugs.   Family History: The patient's family history includes Cancer in his father; Hypertension in his mother.    ROS: Please see the history of present illness. All other systems are reviewed and negative.    PHYSICAL EXAM: Filed Vitals:   11/16/15 0616  BP: 121/75  Pulse: 79  Temp: 97.7 F (36.5 C)  Resp: 18   GEN: overweight in no acute distress  HEENT: normal  Neck: + JVD, no carotid bruits, or masses Cardiac: RRR; no murmurs, rubs, or gallops,no edema  Respiratory: Decreased BS at the bases, normal work of breathing GI: soft, nontender, nondistended, + BS MS: no deformity or atrophy  Skin: warm and dry, device pocket is well healed Neuro: Strength and sensation are intact Psych: euthymic mood, full affect  EKG: EKG is ordered today. The ekg ordered today shows sinus rhythm with LBBB (QRS 170 msec)  Device interrogation is reviewed today in detail. See PaceArt for  details.   Recent Labs: 02/12/2015: BUN 39*; Creatinine, Ser 1.70*; Potassium 3.4*; Sodium 136    Lipid Panel   Labs (Brief)       Component Value Date/Time   CHOL 181 04/09/2011 0323   TRIG 137 04/09/2011 0323   HDL 37* 04/09/2011 0323   CHOLHDL 4.9 04/09/2011 0323   VLDL 27 04/09/2011 0323   LDLCALC 117* 04/09/2011 0323       Wt Readings from Last 3 Encounters:  10/04/15 314 lb 9.6 oz (142.702 kg)  03/26/15 311 lb 12.8 oz (141.432 kg)  02/12/15 307 lb 2 oz (139.311 kg)      Other studies Reviewed: EF 40% by recent echo  ASSESSMENT AND PLAN:  1. Nonischemic CM/ chronic systolic dysfunction 2 gram sodium restriction and daily weights encouraged On good medical therapy.  Not presently on  ace inhibitor due to CKD per CHF clinic notes. Given LBBB with QRS > 150 msec, I have encouraged him to consider upgrade to CRT-D device. Risks, benefits, and alternatives to ICD upgrade to CRT were discussed in detail today. The patient understands that risks include but are not limited to bleeding, infection, pneumothorax, perforation, tamponade, vascular damage, renal failure, MI, stroke, death, damage to his existing leads, and lead dislodgement and wishes to proceed.    ICD Criteria  Current LVEF:40%. Within 12 months prior to implant: Yes   Heart failure history: Yes, Class III  Cardiomyopathy history: Yes, Non-Ischemic Cardiomyopathy.  Atrial Fibrillation/Atrial Flutter: Yes, Paroxysmal.  Ventricular tachycardia history: Yes, Hemodynamic instability present. VT Type: Sustained Ventricular Tachycardia - Monomorphic.  Cardiac arrest history: No.  History of syndromes with risk of sudden death: No.  Previous ICD: Yes, Reason for ICD:  Secondary prevention.  Current ICD indication: Secondary  PPM indication: No.   Class I or II Bradycardia indication present: No  Beta Blocker therapy for 3 or more months: Yes, prescribed.   Ace  Inhibitor/ARB therapy for 3 or more months: No, medical reason. CKD  LBBB, QRS > 150 msec  Hillis Range MD, [facility] 11/16/2015 7:24 AM

## 2015-11-16 NOTE — Discharge Summary (Signed)
ELECTROPHYSIOLOGY PROCEDURE DISCHARGE SUMMARY    Patient ID: Gary Hutchinson,  MRN: [id number], DOB/AGE: 10-Oct-1960 55 y.o.  Admit date: 11/16/2015 Discharge date: 11/17/2015  Primary Care Physician: [name], MD Primary Cardiologist: [name] Electrophysiologist: Ysabella Babiarz  Primary Discharge Diagnosis:  Chronic systolic heart failure, LBBB s/p CRTD upgrade this admission  Secondary Discharge Diagnosis:  1.  CKD stage III 2.  HTN 3.  Sleep apnea, on CPAP 4.  Morbid obesity 5.  Paroxysmal atrial fibrillation  No Known Allergies   Procedures This Admission:  1.  Upgrade of a previously implanted single chamber ICD to a MDT CRTD on 11/16/15 by Dr [name].  The patient received a MDT model number Ovidio Kin CRTD with model number 5076 right atrial lead and 4598 left ventricular lead.  The patient's previously implanted RV lead was used.  DFT's were deferred at time of implant.  There were no immediate post procedure complications. 2.  CXR on 11/17/15 demonstrated no pneumothorax status post device implantation.   Brief HPI: Gary Hutchinson is a 55 y.o. male with a longstanding cardiomyopathy and previously implanted single chamber ICD.  He has had progressive heart failure and has developed LBBB. Risks, benefits, and alternatives to CRTD upgrade were reviewed with the patient who wished to proceed.   Hospital Course:  The patient was admitted and underwent upgrade of a previously implanted single chamber ICD to a MDT CRTD with details as outlined above. He was monitored on telemetry overnight which demonstrated sinus rhythm with BiV pacing.  Left chest was without hematoma or ecchymosis.  The device was interrogated and found to be functioning normally.  CXR was obtained and demonstrated no pneumothorax status post device implantation.  Wound care, arm mobility, and restrictions were reviewed with the patient.  The patient was examined and considered stable for discharge to home.  Creatinine was slightly elevated and potassium was low.  Pt received additional potassium.  He was instructed to hold demadex until Tuesday 11/20/15.  He was also instructed to continue potassium.  He was instructed to resume xarelto on 11/20/15.  He will have repeat bmet 11/21/15.  The patient's discharge medications include a beta blocker (Bisoprolol). No ACE-I with CKD.   Physical Exam: Filed Vitals:   11/16/15 2001 11/16/15 2126 11/16/15 2131 11/17/15 0410  BP: 106/57  108/54 98/55  Pulse: 58   57  Temp: 97.4 F (36.3 C)   97.7 F (36.5 C)  TempSrc: Oral   Oral  Resp: Height:      Weight:    298 lb 4.5 oz (135.3 kg)  SpO2: 96%   97%    GEN- The patient is well appearing, alert and oriented x 3 today.   HEENT: normocephalic, atraumatic; sclera clear, conjunctiva pink; hearing intact; oropharynx clear; neck supple, no JVP Lymph- no cervical lymphadenopathy Lungs- Clear to ausculation bilaterally, normal work of breathing.  No wheezes, rales, rhonchi Heart- Regular rate and rhythm, no murmurs, rubs or gallops, PMI not laterally displaced GI- soft, non-tender, non-distended, bowel sounds present, no hepatosplenomegaly Extremities- no clubbing, cyanosis, or edema; DP/PT/radial pulses 2+ bilaterally MS- no significant deformity or atrophy Skin- warm and dry, no rash or lesion, left chest without hematoma/ecchymosis Psych- euthymic mood, full affect Neuro- strength and sensation are intact   Labs:   Lab Results  Component Value Date   WBC 6.1 11/09/2015   HGB 13.5 11/09/2015   HCT 42.5 11/09/2015   MCV 86.4 11/09/2015  PLT 255 11/09/2015     Recent Labs Lab 11/17/15 0336  NA 138  K 3.2*  CL 98*  CO2 26  BUN 50*  CREATININE 2.04*  CALCIUM 9.2  GLUCOSE 146*    Discharge Medications:    Medication List    TAKE these medications        acetaminophen 325 MG tablet  Commonly known as:  TYLENOL  Take 650 mg by mouth every 6 (six) hours as needed (pain).  Reported on 10/11/2015     ADVAIR DISKUS 250-50 MCG/DOSE Aepb  Generic drug:  Fluticasone-Salmeterol  Inhale 1 puff into the lungs every 12 (twelve) hours as needed. For wheezing or shortness of breath     allopurinol 100 MG tablet  Commonly known as:  ZYLOPRIM  Take 300 mg by mouth daily.     amiodarone 200 MG tablet  Commonly known as:  PACERONE  Take 1 tablet (200 mg total) by mouth daily.     atorvastatin 40 MG tablet  Commonly known as:  LIPITOR  Take 1 tablet (40 mg total) by mouth daily.     bisoprolol 10 MG tablet  Commonly known as:  ZEBETA  TAKE 1/2 TABLETS (5 MG TOTAL) BY MOUTH DAILY.     colchicine 0.6 MG tablet  Take 0.6 mg by mouth daily as needed. For gout     digoxin 0.125 MG tablet  Commonly known as:  LANOXIN  Take 1 tablet (0.125 mg total) by mouth daily.     fluticasone 50 MCG/ACT nasal spray  Commonly known as:  FLONASE  Place 2 sprays into the nose daily as needed for allergies.     hydrALAZINE 50 MG tablet  Commonly known as:  APRESOLINE  Take 50 mg by mouth 2 (two) times daily.     insulin detemir 100 UNIT/ML injection  Commonly known as:  LEVEMIR  Inject 28 Units into the skin at bedtime.     isosorbide mononitrate 30 MG 24 hr tablet  Commonly known as:  IMDUR  TAKE 1 TABLET (30 MG TOTAL) BY MOUTH 2 (TWO) TIMES DAILY.     linagliptin 5 MG Tabs tablet  Commonly known as:  TRADJENTA  Take 1 tablet (5 mg total) by mouth daily.     metolazone 2.5 MG tablet  Commonly known as:  ZAROXOLYN  Take 2.5 mg by mouth daily as needed (edema).     montelukast 10 MG tablet  Commonly known as:  SINGULAIR  Take 10 mg by mouth daily as needed. For shortness of breath or wheezing     potassium chloride SA 20 MEQ tablet  Commonly known as:  K-DUR,KLOR-CON  Take 40  Meq(2 tabs) in the AM and 20 meQ (1 tab)in the PM     PROAIR HFA 108 (90 Base) MCG/ACT inhaler  Generic drug:  albuterol  Inhale 2 puffs into the lungs every 6 (six) hours as needed. For  wheezing or shortness of breath     rivaroxaban 20 MG Tabs tablet  Commonly known as:  XARELTO  Take 1 tablet (20 mg total) by mouth daily.     spironolactone 25 MG tablet  Commonly known as:  ALDACTONE  Take 12.5 mg by mouth daily. Take 1/2 tablet by mouth once a day     torsemide 20 MG tablet  Commonly known as:  DEMADEX  Take 2 tablets (40 mg total) by mouth 2 (two) times daily.        He was instructed to hold demadex  until Tuesday 11/20/15.  He was also instructed to continue potassium.  He was instructed to resume xarelto on 11/20/15.  He will have repeat bmet 11/21/15.   Disposition:   Follow-up Information    Follow up with [facility] On 11/26/2015.   Specialty:  Cardiology   Why:  at 10:30AM for wound check    Contact information:   [address], [address] [place] [address] [phone]      Follow up with [facility] On 12/06/2015.   Specialty:  Cardiology   Why:  at [facility] information:   [address] [id number] Wilhe[place] Bonito [place] [address] [phone]      Follow up with Hillis Range, MD On 02/18/2016.   Specialty:  Cardiology   Why:  at [facility] information:   [address] [place] [address] [phone]       Follow up with [facility] On 11/21/2015.   Specialty:  Cardiology   Why:  between 7:30 am and 5pm for blood work   Solicitor information:   [address], [address] [place] [address] [phone]      Duration of Discharge Encounter: Greater than 30 minutes including physician time.  Randolm Idol MD  11/17/2015 8:23 AM

## 2015-11-17 ENCOUNTER — Ambulatory Visit (HOSPITAL_COMMUNITY): Payer: Medicare Other

## 2015-11-17 ENCOUNTER — Other Ambulatory Visit: Payer: Self-pay | Admitting: Nurse Practitioner

## 2015-11-17 ENCOUNTER — Other Ambulatory Visit: Payer: Self-pay

## 2015-11-17 DIAGNOSIS — I472 Ventricular tachycardia: Secondary | ICD-10-CM | POA: Diagnosis not present

## 2015-11-17 DIAGNOSIS — I5022 Chronic systolic (congestive) heart failure: Secondary | ICD-10-CM

## 2015-11-17 DIAGNOSIS — I429 Cardiomyopathy, unspecified: Secondary | ICD-10-CM

## 2015-11-17 DIAGNOSIS — Z4502 Encounter for adjustment and management of automatic implantable cardiac defibrillator: Secondary | ICD-10-CM | POA: Diagnosis not present

## 2015-11-17 DIAGNOSIS — I428 Other cardiomyopathies: Secondary | ICD-10-CM | POA: Diagnosis not present

## 2015-11-17 DIAGNOSIS — I447 Left bundle-branch block, unspecified: Secondary | ICD-10-CM | POA: Diagnosis not present

## 2015-11-17 LAB — BASIC METABOLIC PANEL
Anion gap: 14 (ref 5–15)
BUN: 50 mg/dL — AB (ref 6–20)
CO2: 26 mmol/L (ref 22–32)
CREATININE: 2.04 mg/dL — AB (ref 0.61–1.24)
Calcium: 9.2 mg/dL (ref 8.9–10.3)
Chloride: 98 mmol/L — ABNORMAL LOW (ref 101–111)
GFR calc Af Amer: 41 mL/min — ABNORMAL LOW (ref >60)
GFR, EST NON AFRICAN AMERICAN: 35 mL/min — AB (ref >60)
GLUCOSE: 146 mg/dL — AB (ref 65–99)
Potassium: 3.2 mmol/L — ABNORMAL LOW (ref 3.5–5.1)
SODIUM: 138 mmol/L (ref 135–145)

## 2015-11-17 LAB — GLUCOSE, CAPILLARY: GLUCOSE-CAPILLARY: 132 mg/dL — AB (ref 65–99)

## 2015-11-17 MED ORDER — POTASSIUM CHLORIDE CRYS ER 20 MEQ PO TBCR
40.0000 meq | EXTENDED_RELEASE_TABLET | ORAL | Status: AC
  Administered 2015-11-17: 09:00:00 40 meq via ORAL
  Filled 2015-11-17: qty 2

## 2015-11-19 ENCOUNTER — Encounter (HOSPITAL_COMMUNITY): Payer: Self-pay | Admitting: Internal Medicine

## 2015-11-19 MED FILL — Cefazolin in D5W Inj 1 GM/50ML: INTRAVENOUS | Qty: 50 | Status: AC

## 2015-11-19 MED FILL — Cefazolin Sodium for IV Soln 2 GM and Dextrose 3% (50 ML): INTRAVENOUS | Qty: 50 | Status: AC

## 2015-11-20 ENCOUNTER — Encounter (HOSPITAL_COMMUNITY): Payer: Self-pay | Admitting: Internal Medicine

## 2015-11-21 ENCOUNTER — Other Ambulatory Visit (INDEPENDENT_AMBULATORY_CARE_PROVIDER_SITE_OTHER): Payer: Medicare Other | Admitting: *Deleted

## 2015-11-21 DIAGNOSIS — I5022 Chronic systolic (congestive) heart failure: Secondary | ICD-10-CM

## 2015-11-21 LAB — BASIC METABOLIC PANEL
BUN: 32 mg/dL — AB (ref 7–25)
CALCIUM: 8.5 mg/dL — AB (ref 8.6–10.3)
CO2: 25 mmol/L (ref 20–31)
CREATININE: 1.38 mg/dL — AB (ref 0.70–1.33)
Chloride: 103 mmol/L (ref 98–110)
GLUCOSE: 196 mg/dL — AB (ref 65–99)
Potassium: 3.9 mmol/L (ref 3.5–5.3)
Sodium: 137 mmol/L (ref 135–146)

## 2015-11-21 NOTE — Addendum Note (Signed)
Addended by: Tonita Phoenix on: 11/21/2015 07:33 AM   Modules accepted: Orders

## 2015-11-23 ENCOUNTER — Telehealth: Payer: Self-pay | Admitting: *Deleted

## 2015-11-23 ENCOUNTER — Encounter: Payer: Self-pay | Admitting: Adult Health

## 2015-11-23 ENCOUNTER — Ambulatory Visit (INDEPENDENT_AMBULATORY_CARE_PROVIDER_SITE_OTHER): Payer: Medicare Other | Admitting: *Deleted

## 2015-11-23 ENCOUNTER — Encounter: Payer: Self-pay | Admitting: Internal Medicine

## 2015-11-23 DIAGNOSIS — Z95810 Presence of automatic (implantable) cardiac defibrillator: Secondary | ICD-10-CM | POA: Diagnosis not present

## 2015-11-23 DIAGNOSIS — I5022 Chronic systolic (congestive) heart failure: Secondary | ICD-10-CM

## 2015-11-23 LAB — CUP PACEART INCLINIC DEVICE CHECK
Brady Statistic AP VP Percent: 1.63 %
Brady Statistic AP VS Percent: 0.05 %
Brady Statistic AS VP Percent: 96.19 %
Brady Statistic AS VS Percent: 2.12 %
Brady Statistic RA Percent Paced: 1.69 %
Brady Statistic RV Percent Paced: 9.05 %
Date Time Interrogation Session: 20170217102940
HighPow Impedance: 56 Ohm
Implantable Lead Location: 753859
Implantable Lead Model: 5076
Lead Channel Impedance Value: 342 Ohm
Lead Channel Impedance Value: 361 Ohm
Lead Channel Impedance Value: 399 Ohm
Lead Channel Impedance Value: 399 Ohm
Lead Channel Impedance Value: 513 Ohm
Lead Channel Impedance Value: 665 Ohm
Lead Channel Pacing Threshold Amplitude: 1 V
Lead Channel Pacing Threshold Pulse Width: 0.4 ms
Lead Channel Sensing Intrinsic Amplitude: 6.25 mV
Lead Channel Setting Pacing Amplitude: 3.25 V
Lead Channel Setting Pacing Amplitude: 3.5 V
MDC IDC LEAD IMPLANT DT: 20090901
MDC IDC LEAD IMPLANT DT: 20170210
MDC IDC LEAD IMPLANT DT: 20170210
MDC IDC LEAD LOCATION: 753858
MDC IDC LEAD LOCATION: 753860
MDC IDC LEAD MODEL: 4598
MDC IDC LEAD MODEL: 6935
MDC IDC MSMT BATTERY REMAINING LONGEVITY: 97 mo
MDC IDC MSMT BATTERY VOLTAGE: 3.01 V
MDC IDC MSMT LEADCHNL LV IMPEDANCE VALUE: 513 Ohm
MDC IDC MSMT LEADCHNL LV IMPEDANCE VALUE: 589 Ohm
MDC IDC MSMT LEADCHNL LV IMPEDANCE VALUE: 646 Ohm
MDC IDC MSMT LEADCHNL LV IMPEDANCE VALUE: 646 Ohm
MDC IDC MSMT LEADCHNL LV IMPEDANCE VALUE: 646 Ohm
MDC IDC MSMT LEADCHNL LV PACING THRESHOLD PULSEWIDTH: 0.4 ms
MDC IDC MSMT LEADCHNL RA PACING THRESHOLD AMPLITUDE: 1.25 V
MDC IDC MSMT LEADCHNL RA PACING THRESHOLD PULSEWIDTH: 0.4 ms
MDC IDC MSMT LEADCHNL RA SENSING INTR AMPL: 3 mV
MDC IDC MSMT LEADCHNL RV IMPEDANCE VALUE: 285 Ohm
MDC IDC MSMT LEADCHNL RV IMPEDANCE VALUE: 342 Ohm
MDC IDC MSMT LEADCHNL RV PACING THRESHOLD AMPLITUDE: 1 V
MDC IDC SET LEADCHNL LV PACING PULSEWIDTH: 0.4 ms
MDC IDC SET LEADCHNL RV SENSING SENSITIVITY: 0.3 mV

## 2015-11-23 NOTE — Telephone Encounter (Signed)
-----   Message from Amber K Seiler, NP sent at 11/21/2015  7:26 PM EST ----- Please notify patient of results. Renal function significantly improved. Please verify that he resumed diuretic. 

## 2015-11-23 NOTE — Progress Notes (Signed)
Wound check appointment s/p BiV upgrade. Steri-strips removed. Wound without redness or edema. Incision edges approximated, wound well healed. Normal device function. Thresholds, sensing, and impedances consistent with implant measurements. Device programmed at 3.5V for extra safety margin in RA, 3.25V in LV. Histogram distribution appropriate for patient and level of activity. No mode switches or ventricular arrhythmias noted. Patient educated about wound care, arm mobility, lifting restrictions, shock plan. Carelink monitoring reviewed. ROV with JA 02/18/16.

## 2015-11-26 ENCOUNTER — Ambulatory Visit: Payer: Medicare Other

## 2015-11-29 ENCOUNTER — Telehealth: Payer: Self-pay | Admitting: *Deleted

## 2015-11-29 NOTE — Telephone Encounter (Signed)
-----   Message from Marily Lente, NP sent at 11/21/2015  7:26 PM EST ----- Please notify patient of results. Renal function significantly improved. Please verify that he resumed diuretic.

## 2015-12-06 ENCOUNTER — Other Ambulatory Visit (HOSPITAL_COMMUNITY): Payer: Self-pay | Admitting: *Deleted

## 2015-12-06 ENCOUNTER — Ambulatory Visit (HOSPITAL_COMMUNITY): Payer: Medicare Other

## 2015-12-06 DIAGNOSIS — I5022 Chronic systolic (congestive) heart failure: Secondary | ICD-10-CM

## 2015-12-06 MED ORDER — POTASSIUM CHLORIDE CRYS ER 20 MEQ PO TBCR: EXTENDED_RELEASE_TABLET | ORAL | Status: DC

## 2015-12-06 MED ORDER — ISOSORBIDE MONONITRATE ER 30 MG PO TB24: ORAL_TABLET | ORAL | Status: DC

## 2015-12-12 ENCOUNTER — Other Ambulatory Visit (HOSPITAL_COMMUNITY): Payer: Self-pay | Admitting: Internal Medicine

## 2015-12-12 NOTE — Progress Notes (Addendum)
Patient ID: Gary Hutchinson, male   DOB: 1960-12-26, 55 y.o.   MRN: [id number]  [facility]  PCP: Creola Corn EP: Hillis Range  HPI: Gary Hutchinson is a 55 year old male with a history of chronic systolic heart failure, ICM s/p ICD, HTN, DM II, OSA on CPAP, history of ventricular tachycardia status post AICD placement in 2009, atrial fibrillation and morbid obesity.    Admitted 6/1-03/13/14 for A/C HF and cardiogenic shock (co-ox 42%). Diuresed with IV lasix and milrinone which were weaned off.  He went into Afib with RVR and chemically cardioverted back to NSR with IV amiodarone. Placed on Xarelto. BB restarted 1/2 dose and held ACE-I. His blood sugars were elevated along with Hgb A1C and started on insulin. Discharge weight 344 lbs.   RHC (05/2014): Left sided filling pressures well compensated, mild pulm HTN with elevated right-sided pressures and mod/severely depressed CO. Discussion about trial of milrinone but patient wanted to hold off.  Admitted January 2017 with volume overload and cellulitis. Diuresed with IV lasix and required short term milrinone. Loaded on amio and had successful DC-CV on  10/17/2015.  Discharge weight was 307 pounds.   He presents today for regular follow up. After last visit K was increased. Diuretics recently held for bump in creatinine and then resumed. Underwent BiV upgrade 11/16/15. States he did have to take a metolazone during that time, which may have precipitated that event.  Overall feeling good. Denies SOB/PND/Orthopnea. No Bendopnea. Weight at home 301 last night, wearing heavy clothes today.  Increasing activity as able, walking and going out to stores more often.  Walking daily. Tries to follow low salt diet. Planning on Taking all medications. NoBRBPR.   Optivol - Fluid well below threshold on Optivol. Pt activity ~ 2 hrs per day.No line on thoracic impedence, my require time to calibrate after BiV upgrade.   05/03/12: EF 25-30%.  Grade 1  diastolic dysfunction.  Mild MR.  Mod dilated LA.   07/27/13: EF 40%, diff HK, MR mild, LA mild/mod dilated  03/2014: EF30-35%, grade II DD, mild MR, RV systolic fx mildly decreased 10/2014: EF 40%.  10/2015: EF ~40%. RV mildly dilated.    Labs  03/08/13 K 3.8 Creatinine 1.02  07/27/13 K 4.7 Creatinine 1.61 Dig level 0.9 ---> dig cut back to 0.125 mg every other day 10/10/13 K 3.7 Creatinine 1.4  03/17/14: K 3.8, Creatinine 1.8, BUN 29 (PCP office) 05/10/14: K 4.0 Cr 2.4 11/17/15: K 3.2, Creatinine 2.04 05/18/14: K 4.1, Cr 1.8, BUN 34, dig level 0.7,  06/06/14: K 4.2 Creatinine 1.34  08/21/2014: K 4.1 Creatinine 1.46 Dig level 0.7 10/19/2015: K 3.6 Creatinine 2.15  11/21/15: K 3.9, Creatinine 1.38  SH: Lives with son in [place]. Disabled. No ETOH or tobacco abuse  FH; Mother living: HT        Father deceased: was not part of his life so not sure health issues  ROS: All systems negative except as listed in HPI, PMH and Problem List.  Past Medical History  Diagnosis Date  . Nonischemic cardiomyopathy (HCC)     a. LHC (02/2011) normal coronaries  . Ventricular tachycardia [facility])     s/p MDT ICD implant  . Alcohol abuse     now quit  . Pulmonary hypertension (HCC)   . Diverticulosis of colon   . Hemorrhoids, internal   . Cholecystitis   . Morbid obesity (HCC)   . Anemia     iron defi  . Hypertension   .  Gout   . Chronic systolic congestive heart failure (HCC)     a. RHC (02/2011) RA 31/27, RV 71/27, PA67/45, PCWP 46, PA 49%, Fick CO: 3.4 b. ECHO (03/2014) EF 30-35%, grade II DD, mild MR, RV poorly visualized appears mildly decreased c. RHC (05/2014) RA 13, RV 54/4/11, PA 60/22 (37), PCWP 17, Fick CO/CI: 4.4 / 1.9, Thermo CO/CI: 3.8 /1.6, PVR 5.2 WU, PA 57% and 59%  . Paroxysmal atrial fibrillation (HCC)   . AICD (automatic cardioverter/defibrillator) present   . Hyperlipidemia   . Asthma   . Pneumonia 2000s X 1  . OSA on CPAP   . Type II diabetes mellitus (HCC)   . Arthritis      "fingers, knees, some days all my joints ache" (11/16/2015)  . Renal insufficiency     Current Outpatient Prescriptions  Medication Sig Dispense Refill  . acetaminophen (TYLENOL) 325 MG tablet Take 650 mg by mouth every 6 (six) hours as needed (pain). Reported on 10/11/2015    . albuterol (PROAIR HFA) 108 (90 BASE) MCG/ACT inhaler Inhale 2 puffs into the lungs every 6 (six) hours as needed. For wheezing or shortness of breath    . allopurinol (ZYLOPRIM) 300 MG tablet Take 300 mg by mouth daily.    Marland Kitchen amiodarone (PACERONE) 200 MG tablet Take 1 tablet (200 mg total) by mouth daily. 30 tablet 6  . atorvastatin (LIPITOR) 40 MG tablet Take 1 tablet (40 mg total) by mouth daily. (Patient taking differently: Take 40 mg by mouth daily at 6 PM. ) 90 tablet 6  . bisoprolol (ZEBETA) 10 MG tablet TAKE 1/2 TABLETS (5 MG TOTAL) BY MOUTH DAILY. 15 tablet 6  . colchicine 0.6 MG tablet Take 0.6 mg by mouth daily as needed. For gout    . digoxin (LANOXIN) 0.125 MG tablet Take 0.0625 mg by mouth every other day.    . fluticasone (FLONASE) 50 MCG/ACT nasal spray Place 2 sprays into the nose daily as needed for allergies.     . Fluticasone-Salmeterol (ADVAIR DISKUS) 250-50 MCG/DOSE AEPB Inhale 1 puff into the lungs every 12 (twelve) hours as needed. For wheezing or shortness of breath    . hydrALAZINE (APRESOLINE) 50 MG tablet Take 50 mg by mouth 2 (two) times daily.    . insulin detemir (LEVEMIR) 100 UNIT/ML injection Inject 28 Units into the skin at bedtime.    . isosorbide mononitrate (IMDUR) 30 MG 24 hr tablet TAKE 1 TABLET (30 MG TOTAL) BY MOUTH 2 (TWO) TIMES DAILY. 180 tablet 3  . linagliptin (TRADJENTA) 5 MG TABS tablet Take 1 tablet (5 mg total) by mouth daily. 30 tablet 3  . metolazone (ZAROXOLYN) 2.5 MG tablet Take 2.5 mg by mouth daily as needed (edema). Reported on 12/13/2015    . montelukast (SINGULAIR) 10 MG tablet Take 10 mg by mouth daily as needed. For shortness of breath or wheezing    . potassium  chloride SA (K-DUR,KLOR-CON) 20 MEQ tablet Take 40  Meq(2 tabs) in the AM and 20 meQ (1 tab)in the PM 270 tablet 3  . rivaroxaban (XARELTO) 20 MG TABS tablet Take 1 tablet (20 mg total) by mouth daily. 30 tablet 0  . spironolactone (ALDACTONE) 25 MG tablet Take 12.5 mg by mouth daily. Take 1/2 tablet by mouth once a day    . torsemide (DEMADEX) 20 MG tablet Take 2 tablets (40 mg total) by mouth 2 (two) times daily. 120 tablet 3   No current facility-administered medications for this encounter.  Filed Vitals:   12/13/15 0914  BP: 156/94  Pulse: 78  Weight: 307 lb 6.4 oz (139.436 kg)  SpO2: 93%   Wt Readings from Last 3 Encounters:  12/13/15 307 lb 6.4 oz (139.436 kg)  11/17/15 298 lb 4.5 oz (135.3 kg)  10/30/15 302 lb (136.986 kg)     PHYSICAL EXAM: General:  Well appearing. No resp difficulty, NAD. Ambulated in the clinic without difficulty.  HEENT: normal Neck: Thick. JVP difficult to assess d/t body but appears mildly elevated; Carotids 2+ bilaterally; no bruits. No thyromegaly or nodule noted.  Cor: PMI nonpalpable. RRR. No rubs or gallops. 2/6 TR murmur. 2/6 MR Lungs: CTAB, normal effort Abdomen: obese, soft, nontender, non-distended.  Good bowel sounds. Extremities: no cyanosis, clubbing, rash, tr-1+ edema. Neuro: alert & orientedx3, cranial nerves grossly intact. Moves all 4 extremities w/o difficulty. Affect pleasant.  EKG: A-sensed Vpaced 69 bmp.   ASSESSMENT & PLAN:   1) Chronic systolic HF: NICM s/p ICD Medtronic, EF 40% 10/2015. Now s/p BiV upgrade.  - NYHA II symptoms and volume status stable. Continue torsemide 40 mg twice a day + 20 meq potassium daily.  - Continue bisoprolol 5 mg daily. HR too low to titrate  - Continue digoxin 0.0625 mg every other day. - Increase hydralazine 75 mg twice a day + Imdur 30 mg BID - Will not add ace due to recent AKI. Consider at pharmacy visit.  - Reinforced the need and importance of daily weights, a low sodium diet, and fluid  restriction (less than 2 L a day). Instructed to call the HF clinic if weight increases more than 3 lbs overnight or 5 lbs in a week.  - Will likely order echo after next provider visit with medication optimization.  2) CKD stage III -  Followed by nephrology. Creatinine baseline 1.8-2.1 Get BMET today.  3) HTN: controlled. Continue current medications 4) OSA - Continue to wear CPAP nightly 5) PAF-  S/P Succesful DC-CV 10/17/2015  New to patient 03/2014.  Recent admit back in A fib RVR and had successful DC/CV on 10/17/15.  - Remains in NSR by EKG today.  - Continue amio 200 daily. Continue Xarelto 20.  Check TSH, LFTs next visit. Needs yearly eye exam and he is aware.  6) Obesity:   - Continued to encouraged to lose weight - Encouraged him to try a low carb diet such as [employer].   Follow up with pharmacy in 1 month for med titration. Follow up 3 months with provider.  Check Dig level, CMET, TSH, Free T4 today.    Graciella Freer [name] 12/13/2015 9:41 AM

## 2015-12-13 ENCOUNTER — Ambulatory Visit (HOSPITAL_COMMUNITY)
Admission: RE | Admit: 2015-12-13 | Discharge: 2015-12-13 | Disposition: A | Discharge status: Home / Self Care | Payer: Medicare Other | Source: Ambulatory Visit | Attending: Cardiology | Admitting: Cardiology

## 2015-12-13 VITALS — BP 156/94 | HR 78 | Wt 307.4 lb

## 2015-12-13 DIAGNOSIS — I10 Essential (primary) hypertension: Secondary | ICD-10-CM

## 2015-12-13 DIAGNOSIS — I4892 Unspecified atrial flutter: Secondary | ICD-10-CM | POA: Diagnosis not present

## 2015-12-13 DIAGNOSIS — I480 Paroxysmal atrial fibrillation: Secondary | ICD-10-CM | POA: Insufficient documentation

## 2015-12-13 DIAGNOSIS — Z95810 Presence of automatic (implantable) cardiac defibrillator: Secondary | ICD-10-CM | POA: Insufficient documentation

## 2015-12-13 DIAGNOSIS — E669 Obesity, unspecified: Secondary | ICD-10-CM | POA: Diagnosis not present

## 2015-12-13 DIAGNOSIS — I130 Hypertensive heart and chronic kidney disease with heart failure and stage 1 through stage 4 chronic kidney disease, or unspecified chronic kidney disease: Secondary | ICD-10-CM | POA: Insufficient documentation

## 2015-12-13 DIAGNOSIS — I5022 Chronic systolic (congestive) heart failure: Secondary | ICD-10-CM | POA: Insufficient documentation

## 2015-12-13 DIAGNOSIS — J45909 Unspecified asthma, uncomplicated: Secondary | ICD-10-CM | POA: Diagnosis not present

## 2015-12-13 DIAGNOSIS — N183 Chronic kidney disease, stage 3 (moderate): Secondary | ICD-10-CM | POA: Diagnosis not present

## 2015-12-13 DIAGNOSIS — Z794 Long term (current) use of insulin: Secondary | ICD-10-CM | POA: Insufficient documentation

## 2015-12-13 DIAGNOSIS — E1122 Type 2 diabetes mellitus with diabetic chronic kidney disease: Secondary | ICD-10-CM | POA: Insufficient documentation

## 2015-12-13 DIAGNOSIS — M109 Gout, unspecified: Secondary | ICD-10-CM | POA: Insufficient documentation

## 2015-12-13 DIAGNOSIS — I272 Other secondary pulmonary hypertension: Secondary | ICD-10-CM | POA: Diagnosis not present

## 2015-12-13 DIAGNOSIS — G4733 Obstructive sleep apnea (adult) (pediatric): Secondary | ICD-10-CM | POA: Diagnosis not present

## 2015-12-13 DIAGNOSIS — E785 Hyperlipidemia, unspecified: Secondary | ICD-10-CM | POA: Diagnosis not present

## 2015-12-13 DIAGNOSIS — Z7901 Long term (current) use of anticoagulants: Secondary | ICD-10-CM | POA: Insufficient documentation

## 2015-12-13 DIAGNOSIS — I428 Other cardiomyopathies: Secondary | ICD-10-CM | POA: Diagnosis not present

## 2015-12-13 DIAGNOSIS — Z79899 Other long term (current) drug therapy: Secondary | ICD-10-CM | POA: Insufficient documentation

## 2015-12-13 LAB — T4, FREE: FREE T4: 0.98 ng/dL (ref 0.61–1.12)

## 2015-12-13 LAB — COMPREHENSIVE METABOLIC PANEL
ALBUMIN: 3.3 g/dL — AB (ref 3.5–5.0)
ALK PHOS: 113 U/L (ref 38–126)
ALT: 31 U/L (ref 17–63)
ANION GAP: 10 (ref 5–15)
AST: 32 U/L (ref 15–41)
BILIRUBIN TOTAL: 0.6 mg/dL (ref 0.3–1.2)
BUN: 25 mg/dL — ABNORMAL HIGH (ref 6–20)
CO2: 27 mmol/L (ref 22–32)
CREATININE: 1.47 mg/dL — AB (ref 0.61–1.24)
Calcium: 9.4 mg/dL (ref 8.9–10.3)
Chloride: 99 mmol/L — ABNORMAL LOW (ref 101–111)
GFR calc non Af Amer: 52 mL/min — ABNORMAL LOW (ref >60)
Glucose, Bld: 310 mg/dL — ABNORMAL HIGH (ref 65–99)
POTASSIUM: 4.2 mmol/L (ref 3.5–5.1)
Sodium: 136 mmol/L (ref 135–145)
Total Protein: 7.1 g/dL (ref 6.5–8.1)

## 2015-12-13 LAB — DIGOXIN LEVEL: Digoxin Level: 0.7 ng/mL — ABNORMAL LOW (ref 0.8–2.0)

## 2015-12-13 LAB — TSH: TSH: 2.794 u[IU]/mL (ref 0.350–4.500)

## 2015-12-13 MED ORDER — HYDRALAZINE HCL 50 MG PO TABS: 75.0000 mg | ORAL_TABLET | Freq: Two times a day (BID) | ORAL | Status: DC

## 2015-12-13 NOTE — Patient Instructions (Signed)
INCREASE Hydralzine to 75 mg ( one and one half tabs) twice a day  Labs today  Your physician recommends that you schedule a follow-up appointment in: 1 month with the CHF pharmacist and in 3 months with the MD  Do the following things EVERYDAY: 1) Weigh yourself in the morning before breakfast. Write it down and keep it in a log. 2) Take your medicines as prescribed 3) Eat low salt foods-Limit salt (sodium) to 2000 mg per day.  4) Stay as active as you can everyday 5) Limit all fluids for the day to less than 2 liters 6)

## 2015-12-13 NOTE — Progress Notes (Signed)
Advanced Heart Failure Medication Review by a Pharmacist  Does the patient  feel that his/her medications are working for him/her?  yes  Has the patient been experiencing any side effects to the medications prescribed?  no  Does the patient measure his/her own blood pressure or blood glucose at home?  yes   Does the patient have any problems obtaining medications due to transportation or finances?   no  Understanding of regimen: good Understanding of indications: good Potential of compliance: good Patient understands to avoid NSAIDs. Patient understands to avoid decongestants.  Issues to address at subsequent visits: None   Pharmacist comments: 55 YO pleasant male presenting for HF followup without his medication list.  Pt was able to state when he takes each of his medications.  Pt reports adherence to his regimen and denies SE to his current meds.  Pt denies s/sx of bleeding on xarelto. Pt reports using metolazone once over the past two weeks and that his BP has been lower at home.     Time with patient: 5 min Preparation and documentation time: 5 min Total time: 10 min

## 2015-12-24 ENCOUNTER — Ambulatory Visit: Payer: Medicare Other | Admitting: Internal Medicine

## 2016-01-04 ENCOUNTER — Other Ambulatory Visit (HOSPITAL_COMMUNITY): Payer: Self-pay | Admitting: Internal Medicine

## 2016-01-11 ENCOUNTER — Telehealth (HOSPITAL_COMMUNITY): Payer: Self-pay | Admitting: Vascular Surgery

## 2016-01-11 NOTE — Telephone Encounter (Signed)
Left pt detailed message to reschedule pt pharmacy appt, appt was rescheduled for Wed 01/16/16

## 2016-01-14 ENCOUNTER — Ambulatory Visit (HOSPITAL_COMMUNITY): Payer: Medicare Other

## 2016-01-16 ENCOUNTER — Ambulatory Visit (HOSPITAL_COMMUNITY): Payer: Medicare Other

## 2016-01-24 ENCOUNTER — Other Ambulatory Visit (HOSPITAL_COMMUNITY): Payer: Self-pay | Admitting: *Deleted

## 2016-01-24 MED ORDER — DIGOXIN 125 MCG PO TABS: 0.0625 mg | ORAL_TABLET | ORAL | Status: DC

## 2016-01-30 ENCOUNTER — Ambulatory Visit (HOSPITAL_COMMUNITY): Payer: Medicare Other

## 2016-02-06 ENCOUNTER — Ambulatory Visit (HOSPITAL_COMMUNITY)
Admission: RE | Admit: 2016-02-06 | Discharge: 2016-02-06 | Disposition: A | Discharge status: Home / Self Care | Payer: Medicare Other | Source: Ambulatory Visit | Attending: Internal Medicine | Admitting: Internal Medicine

## 2016-02-06 DIAGNOSIS — E1122 Type 2 diabetes mellitus with diabetic chronic kidney disease: Secondary | ICD-10-CM | POA: Insufficient documentation

## 2016-02-06 DIAGNOSIS — Z79899 Other long term (current) drug therapy: Secondary | ICD-10-CM | POA: Insufficient documentation

## 2016-02-06 DIAGNOSIS — I480 Paroxysmal atrial fibrillation: Secondary | ICD-10-CM | POA: Insufficient documentation

## 2016-02-06 DIAGNOSIS — I5022 Chronic systolic (congestive) heart failure: Secondary | ICD-10-CM | POA: Diagnosis present

## 2016-02-06 DIAGNOSIS — N183 Chronic kidney disease, stage 3 (moderate): Secondary | ICD-10-CM | POA: Insufficient documentation

## 2016-02-06 DIAGNOSIS — Z7901 Long term (current) use of anticoagulants: Secondary | ICD-10-CM | POA: Diagnosis not present

## 2016-02-06 DIAGNOSIS — E669 Obesity, unspecified: Secondary | ICD-10-CM | POA: Insufficient documentation

## 2016-02-06 DIAGNOSIS — Z95810 Presence of automatic (implantable) cardiac defibrillator: Secondary | ICD-10-CM | POA: Insufficient documentation

## 2016-02-06 DIAGNOSIS — G4733 Obstructive sleep apnea (adult) (pediatric): Secondary | ICD-10-CM | POA: Diagnosis not present

## 2016-02-06 DIAGNOSIS — I130 Hypertensive heart and chronic kidney disease with heart failure and stage 1 through stage 4 chronic kidney disease, or unspecified chronic kidney disease: Secondary | ICD-10-CM | POA: Insufficient documentation

## 2016-02-06 MED ORDER — BISOPROLOL FUMARATE 5 MG PO TABS: 5.0000 mg | ORAL_TABLET | Freq: Every day | ORAL | Status: DC

## 2016-02-06 NOTE — Patient Instructions (Signed)
It was great to see you today! Congratulations on your weight loss!  Continue your current medication regimen and we will consider adding losartan or Entresto next time.   We will call you to set up an appointment with Dr. Gala Romney in 1 month.

## 2016-02-06 NOTE — Progress Notes (Signed)
HPI:  Gary Hutchinson is a 55 year old male with a history of chronic systolic heart failure, ICM s/p ICD, HTN, DM II, OSA on CPAP, history of ventricular tachycardia status post AICD placement in 2009, atrial fibrillation and morbid obesity.   He presents today for pharmacist-led HF medication titration. At his last HF clinic visit, his hydralazine was increased from 50 mg BID to 75 mg BID. He states that he is actively trying to lose weight by increasing exercise and eating more fruits and vegetables.   . Shortness of breath/dyspnea on exertion? No - walks about 4000-5000 steps at least 3-4 days/wk, limited by his knees, can get around grocery store without getting short of breath   . Orthopnea/PND? No . Edema? No . Lightheadedness/dizziness? No . Daily weights at home? Yes - 290-295 lb  . Blood pressure/heart rate monitoring at home? Yes - SBP 110-125 mmHg range . Following low-sodium/fluid-restricted diet? Yes - drinks water with sugar free flavoring and diet soda; not adding table salt, reads labels and tries to keep sodium below 2000 mg     HF Medications: Bisoprolol 5 mg PO daily  Digoxin 0.0625 mg PO every other day  Hydralazine 75 mg PO TID Isosorbide mononitrate 30 mg PO BID Spironolactone 12.5 mg PO daily  Torsemide 40 mg PO BID K-dur 40 meq QAM and 20 meq QPM   Has the patient been experiencing any side effects to the medications prescribed?  no  Does the patient have any problems obtaining medications due to transportation or finances?   No but copays seem to be steadily rising, will enroll in [employer] for HF meds ($1500 toward copays through 02/04/2017) Billing ID: [id number] Person Code: [id number] RX Group: [id number] RX BIN: [id number] PCN for Part D: [id number]  Understanding of regimen: good Understanding of indications: good Potential of compliance: good Patient understands to avoid NSAIDs. Patient understands to avoid decongestants.    Pertinent Lab Values: . 12/13/15: Serum  creatinine 1.47 (BL ~1.4), CrCl ~100 ml/min, CO2 99, Potassium 4.2, Sodium 136, Digoxin 0.7, TSH/T4 WNL, LFTs WNL  Vital Signs: . Weight: 293 lb (last clinic visit: 307 lb) . Blood pressure: 118/70 mmHg  . Heart rate: 52 bpm  Heart Failure Assessment & Plan: . Ejection fraction: 40% (10/15/15) . NYHA symptom class: II . Based on clinical presentation, vital signs, and recent labs, will recommend to continue current regimen  Summary: 1) Chronic systolic HF: NICM s/p ICD Medtronic, EF 40% 10/2015. Now s/p BiV upgrade.  - NYHA II symptoms and volume status stable. Continue torsemide 40 mg twice a day + 40 meq QAM and 20 meq QPM potassium  - Continue bisoprolol 5 mg daily. HR too low to titrate  - Continue digoxin 0.0625 mg every other day. - Continue hydralazine 75 mg twice a day + Imdur 30 mg BID - Discussed addition of ACEI/ARB at length since beneficial for HF, CKD and DMII and patient verbalized understanding but would like to hold off on adding any new medications for the time being since he has been doing well with his weight loss and his ultimate goal would be to reduce the amount of medications he is on - Basic disease state pathophysiology, medication indication, mechanism and side effects reviewed at length with pt, verbalized understanding 2) CKD stage III - Followed by nephrology. Creatinine much improved, new baseline ~1.4. 3) HTN: controlled. Continue current medications 4) OSA - Continue to wear CPAP nightly 5) PAF- S/P Succesful DC-CV 10/17/2015 New to patient 03/2014.  Recent admit back in A fib RVR and had successful DC/CV on 10/17/15.  - Continue amio 200 daily. Continue Xarelto 20 mg daily. - TSH, LFTs WNL in 12/2015. Needs yearly eye exam and he is aware.  6) Obesity:  - Congratulated on losing 14 lbs since last clinic visit - Encouraged continued weight loss    1) Medication changes: Continue with current regimen 2) Labs: PRN 3) Follow-up: 1 month with Dr.  Jarold Motto K. Bonnye Fava, PharmD, BCPS, CPP Clinical Pharmacist Pager: [phone] Phone: [phone] 02/06/2016 1:35 PM

## 2016-02-18 ENCOUNTER — Encounter: Payer: Medicare Other | Admitting: Internal Medicine

## 2016-04-02 ENCOUNTER — Encounter: Payer: Self-pay | Admitting: Internal Medicine

## 2016-04-02 ENCOUNTER — Ambulatory Visit (INDEPENDENT_AMBULATORY_CARE_PROVIDER_SITE_OTHER): Payer: Medicare Other | Admitting: Internal Medicine

## 2016-04-02 VITALS — BP 126/70 | HR 72 | Ht 65.5 in | Wt 297.2 lb

## 2016-04-02 DIAGNOSIS — I429 Cardiomyopathy, unspecified: Secondary | ICD-10-CM

## 2016-04-02 DIAGNOSIS — I428 Other cardiomyopathies: Secondary | ICD-10-CM

## 2016-04-02 DIAGNOSIS — Z95810 Presence of automatic (implantable) cardiac defibrillator: Secondary | ICD-10-CM

## 2016-04-02 DIAGNOSIS — I5022 Chronic systolic (congestive) heart failure: Secondary | ICD-10-CM | POA: Diagnosis not present

## 2016-04-02 DIAGNOSIS — I447 Left bundle-branch block, unspecified: Secondary | ICD-10-CM

## 2016-04-02 DIAGNOSIS — I472 Ventricular tachycardia, unspecified: Secondary | ICD-10-CM

## 2016-04-02 DIAGNOSIS — I480 Paroxysmal atrial fibrillation: Secondary | ICD-10-CM

## 2016-04-02 LAB — CUP PACEART INCLINIC DEVICE CHECK
Brady Statistic AP VP Percent: 9.94 %
Brady Statistic AP VS Percent: 0.22 %
Brady Statistic AS VP Percent: 87.98 %
Brady Statistic AS VS Percent: 1.86 %
Brady Statistic RV Percent Paced: 9.31 %
Date Time Interrogation Session: 20170628123841
HighPow Impedance: 71 Ohm
Implantable Lead Implant Date: 20090901
Implantable Lead Implant Date: 20170210
Implantable Lead Location: 753858
Implantable Lead Location: 753860
Implantable Lead Model: 4598
Implantable Lead Model: 5076
Lead Channel Impedance Value: 285 Ohm
Lead Channel Impedance Value: 399 Ohm
Lead Channel Impedance Value: 418 Ohm
Lead Channel Impedance Value: 418 Ohm
Lead Channel Impedance Value: 475 Ohm
Lead Channel Impedance Value: 551 Ohm
Lead Channel Impedance Value: 874 Ohm
Lead Channel Pacing Threshold Amplitude: 0.75 V
Lead Channel Pacing Threshold Amplitude: 1 V
Lead Channel Pacing Threshold Pulse Width: 0.4 ms
Lead Channel Pacing Threshold Pulse Width: 0.4 ms
Lead Channel Sensing Intrinsic Amplitude: 7.375 mV
Lead Channel Setting Pacing Amplitude: 1.75 V
Lead Channel Setting Pacing Pulse Width: 0.4 ms
Lead Channel Setting Sensing Sensitivity: 0.3 mV
MDC IDC LEAD IMPLANT DT: 20170210
MDC IDC LEAD LOCATION: 753859
MDC IDC LEAD MODEL: 6935
MDC IDC MSMT BATTERY REMAINING LONGEVITY: 110 mo
MDC IDC MSMT BATTERY VOLTAGE: 2.99 V
MDC IDC MSMT LEADCHNL LV IMPEDANCE VALUE: 475 Ohm
MDC IDC MSMT LEADCHNL LV IMPEDANCE VALUE: 665 Ohm
MDC IDC MSMT LEADCHNL LV IMPEDANCE VALUE: 760 Ohm
MDC IDC MSMT LEADCHNL LV IMPEDANCE VALUE: 836 Ohm
MDC IDC MSMT LEADCHNL LV IMPEDANCE VALUE: 893 Ohm
MDC IDC MSMT LEADCHNL LV PACING THRESHOLD PULSEWIDTH: 0.4 ms
MDC IDC MSMT LEADCHNL RA SENSING INTR AMPL: 2.875 mV
MDC IDC MSMT LEADCHNL RA SENSING INTR AMPL: 3.125 mV
MDC IDC MSMT LEADCHNL RV IMPEDANCE VALUE: 361 Ohm
MDC IDC MSMT LEADCHNL RV PACING THRESHOLD AMPLITUDE: 1 V
MDC IDC MSMT LEADCHNL RV SENSING INTR AMPL: 6.875 mV
MDC IDC SET LEADCHNL LV PACING AMPLITUDE: 2 V
MDC IDC STAT BRADY RA PERCENT PACED: 10.16 %

## 2016-04-02 NOTE — Progress Notes (Signed)
Electrophysiology Office Note   Date:  04/02/2016   ID:  Gary Hutchinson, DOB [birth year], MRN [id number]  PCP:  [name], MD  Cardiologist:  CHF clinic Primary Electrophysiologist: Hillis Range, MD    Chief Complaint  Patient presents with  . Atrial Fibrillation     History of Present Illness: Gary Hutchinson is a 55 y.o. male who presents today for electrophysiology evaluation.   Since recent ICD upgrade to CRT, the patient has done very well.  His exercise tolerance is improved.  Denies SOB and feels that fatigue is also improved.  Happy with current state.  Today, he denies symptoms of palpitations, chest pain,  orthopnea, PND,  claudication, dizziness, presyncope, syncope, bleeding, or neurologic sequela. The patient is tolerating medications without difficulties and is otherwise without complaint today.    Past Medical History  Diagnosis Date  . Nonischemic cardiomyopathy (HCC)     a. LHC (02/2011) normal coronaries  . Ventricular tachycardia [facility])     s/p MDT ICD implant  . Alcohol abuse     now quit  . Pulmonary hypertension (HCC)   . Diverticulosis of colon   . Hemorrhoids, internal   . Cholecystitis   . Morbid obesity (HCC)   . Anemia     iron defi  . Hypertension   . Gout   . Chronic systolic congestive heart failure (HCC)     a. RHC (02/2011) RA 31/27, RV 71/27, PA67/45, PCWP 46, PA 49%, Fick CO: 3.4 b. ECHO (03/2014) EF 30-35%, grade II DD, mild MR, RV poorly visualized appears mildly decreased c. RHC (05/2014) RA 13, RV 54/4/11, PA 60/22 (37), PCWP 17, Fick CO/CI: 4.4 / 1.9, Thermo CO/CI: 3.8 /1.6, PVR 5.2 WU, PA 57% and 59%  . Paroxysmal atrial fibrillation (HCC)   . AICD (automatic cardioverter/defibrillator) present   . Hyperlipidemia   . Asthma   . Pneumonia 2000s X 1  . OSA on CPAP   . Type II diabetes mellitus (HCC)   . Arthritis     "fingers, knees, some days all my joints ache" (11/16/2015)  . Renal insufficiency    Past Surgical History    Procedure Laterality Date  . Insertion of icd  06/2008    ICD- Medtronic   . Cardiac catheterization  03/08/2004    EF 25-30%  . US echocardiography  08/22/2008    EF 30-35%  . Transthoracic echocardiogram  05/27/2008    EF 30-35%  . Right heart catheterization N/A 05/23/2014    Procedure: RIGHT HEART CATH;  Surgeon: [name], MD;  Location[facility] CATH LAB;  Service: Cardiovascular;  Laterality: N/A;  . Cardioversion N/A 10/17/2015    Procedure: CARDIOVERSION;  Surgeon: [name], MD;  Location: [facility] ENDOSCOPY;  Service: Cardiovascular;  Laterality: N/A;  . Ep implantable device N/A 11/16/2015    Procedure: BiVI Upgrade;  Surgeon: Hillis Range, MD;  Location: [facility] INVASIVE CV LAB;  Service: Cardiovascular;  Laterality: N/A;     Current Outpatient Prescriptions  Medication Sig Dispense Refill  . acetaminophen (TYLENOL) 325 MG tablet Take 650 mg by mouth every 6 (six) hours as needed (pain). Reported on 10/11/2015    . albuterol (PROAIR HFA) 108 (90 BASE) MCG/ACT inhaler Inhale 2 puffs into the lungs every 6 (six) hours as needed for wheezing or shortness of breath. Reported on 02/06/2016    . allopurinol (ZYLOPRIM) 300 MG tablet Take 300 mg by mouth daily.    Marland Kitchen amiodarone (PACERONE) 200 MG tablet Take  1 tablet (200 mg total) by mouth daily. 30 tablet 6  . atorvastatin (LIPITOR) 40 MG tablet Take 1 tablet (40 mg total) by mouth daily. 90 tablet 6  . bisoprolol (ZEBETA) 5 MG tablet Take 1 tablet (5 mg total) by mouth daily. 30 tablet 5  . colchicine 0.6 MG tablet Take 0.6 mg by mouth daily as needed (Gout). Reported on 02/06/2016    . digoxin (LANOXIN) 0.125 MG tablet Take 0.5 tablets (0.0625 mg total) by mouth every other day. 45 tablet 3  . Fluticasone-Salmeterol (ADVAIR DISKUS) 250-50 MCG/DOSE AEPB Inhale 1 puff into the lungs every 12 (twelve) hours as needed (shortness of breath). Reported on 02/06/2016    . hydrALAZINE (APRESOLINE) 50 MG tablet Take 1.5 tablets (75 mg total) by mouth 2  (two) times daily. 90 tablet 6  . insulin detemir (LEVEMIR) 100 UNIT/ML injection Inject 36 Units into the skin at bedtime.     . isosorbide mononitrate (IMDUR) 30 MG 24 hr tablet TAKE 1 TABLET (30 MG TOTAL) BY MOUTH 2 (TWO) TIMES DAILY. 180 tablet 3  . linagliptin (TRADJENTA) 5 MG TABS tablet Take 1 tablet (5 mg total) by mouth daily. 30 tablet 3  . metolazone (ZAROXOLYN) 2.5 MG tablet Take 2.5 mg by mouth daily as needed (edema). Reported on 02/06/2016    . montelukast (SINGULAIR) 10 MG tablet Take 10 mg by mouth daily as needed. For shortness of breath or wheezing    . potassium chloride SA (K-DUR,KLOR-CON) 20 MEQ tablet Take 40  mEq (2 tabs) by mouth in the AM and 20 mEq (1 tab) in the PM    . rivaroxaban (XARELTO) 20 MG TABS tablet Take 1 tablet (20 mg total) by mouth daily. 30 tablet 0  . spironolactone (ALDACTONE) 25 MG tablet Take 1/2 tablet by mouth once a day    . torsemide (DEMADEX) 20 MG tablet Take 2 tablets (40 mg total) by mouth 2 (two) times daily. 120 tablet 3   No current facility-administered medications for this visit.    Allergies:   Review of patient's allergies indicates no known allergies.   Social History:  The patient  reports that he has never smoked. He has never used smokeless tobacco. He reports that he drinks alcohol. He reports that he does not use illicit drugs.   Family History:  The patient's family history includes Cancer in his father; Hypertension in his mother.    ROS:  Please see the history of present illness.   All other systems are reviewed and negative.    PHYSICAL EXAM: VS:  BP 126/70 mmHg  Pulse 72  Ht 5' 5.5" (1.664 m)  Wt 297 lb 3.2 oz (134.809 kg)  BMI 48.69 kg/m2 , BMI Body mass index is 48.69 kg/(m^2). GEN: overweight in no acute distress HEENT: normal Neck: no JVD, no carotid bruits, or masses Cardiac: RRR; no murmurs, rubs, or gallops,no edema  Respiratory:  Decreased BS at the bases, normal work of breathing GI: soft, nontender,  nondistended, + BS MS: no deformity or atrophy Skin: warm and dry, device pocket is well healed Neuro:  Strength and sensation are intact Psych: euthymic mood, full affect  EKG:  EKG is ordered today. The ekg ordered today shows sinus rhythm with BiV Pacing (QRS 110 msec)  Device interrogation is reviewed today in detail.  See PaceArt for details.   Recent Labs: 10/14/2015: B Natriuretic Peptide 814.8* 11/09/2015: Hemoglobin 13.5; Platelets 255 12/13/2015: ALT 31; BUN 25*; Creatinine, Ser 1.47*; Potassium 4.2;  Sodium 136; TSH 2.794    Lipid Panel     Component Value Date/Time   CHOL 181 04/09/2011 0323   TRIG 137 04/09/2011 0323   HDL 37* 04/09/2011 0323   CHOLHDL 4.9 04/09/2011 0323   VLDL 27 04/09/2011 0323   LDLCALC 117* 04/09/2011 0323     Wt Readings from Last 3 Encounters:  04/02/16 297 lb 3.2 oz (134.809 kg)  02/06/16 293 lb 9.6 oz (133.176 kg)  12/13/15 307 lb 6.4 oz (139.436 kg)      Other studies Reviewed: Additional studies/ records that were reviewed today include: CHF clinic notes   ASSESSMENT AND PLAN:  1.  Nonischemic CM/ chronic systolic dysfunction 2 gram sodium restriction and daily weights encouraged On good medical therapy Clinically improved with CRT.  With BiV pacing, QRS has reduced from 170 msec to 110 msec! Will enroll in ICM device clinic Importance of remote monitoring discussed Will ask CHF team to repeat echo upon return  Normal BiV ICD function  See Pace Art report No changes today  2. Morbid obesity Weight loss is strongly advised.  He is making some progress.  3. VT No recent arrhythmias  4. AFib On xarelto and amiodarone Given significant atrial enlargement, I worry that he will likely have progressive atrial fibrillation going forward   Current medicines are reviewed at length with the patient today.   The patient does not have concerns regarding his medicines.  The following changes were made today:  None  Return to see  EP NP in 6 months I will see in a year [facility]  Signed, Hillis Range, MD  04/02/2016 9:53 AM     [facility] [address] [place] [address] [phone] (office) [phone] (fax)

## 2016-04-02 NOTE — Patient Instructions (Signed)
Medication Instructions:  Your physician recommends that you continue on your current medications as directed. Please refer to the Current Medication list given to you today.   Labwork: None ordered   Testing/Procedures: None ordered   Follow-Up: Your physician wants you to follow-up in: 6 months with Gypsy Balsam, NP and 12 months with Dr Jacquiline Doe will receive a reminder letter in the mail two months in advance. If you don't receive a letter, please call our office to schedule the follow-up appointment.   Remote monitoring is used to monitor your  ICD from home. This monitoring reduces the number of office visits required to check your device to one time per year. It allows [place] to keep an eye on the functioning of your device to ensure it is working properly. You are scheduled for a device check from home on 07/02/16. You may send your transmission at any time that day. If you have a wireless device, the transmission will be sent automatically. After your physician reviews your transmission, you will receive a postcard with your next transmission date.  Randon Goldsmith, RN will follow monthly for fluid    Any Other Special Instructions Will Be Listed Below (If Applicable).     If you need a refill on your cardiac medications before your next appointment, please call your pharmacy.

## 2016-04-03 NOTE — Progress Notes (Signed)
Referred to ICM clinic by Dr Rayann Heman.  Met patient in the office and provided explanation of ICM program.  He agreed to monthly ICM calls.  He reported he will needed to plug in his monitor.  He is new to the area and getting settled in.  Patient gave permission to leave detailed information on cell and home phone.  1st ICM remote transmission scheduled for 05/07/2016.  Patient sent ICM transmission when he got home.

## 2016-05-07 ENCOUNTER — Ambulatory Visit (INDEPENDENT_AMBULATORY_CARE_PROVIDER_SITE_OTHER): Payer: Medicare Other

## 2016-05-07 DIAGNOSIS — I5022 Chronic systolic (congestive) heart failure: Secondary | ICD-10-CM

## 2016-05-07 DIAGNOSIS — Z95810 Presence of automatic (implantable) cardiac defibrillator: Secondary | ICD-10-CM

## 2016-05-07 NOTE — Progress Notes (Signed)
EPIC Encounter for ICM Monitoring  Patient Name: Gary Hutchinson is a 55 y.o. male Date: 05/07/2016 Primary Care Physican: [name], MD Primary Cardiologist: [name] Electrophysiologist: Allred Dry Weight: unknown Bi-V Pacing:  97%       1st ICM encounter.   Heart Failure questions reviewed, pt asymptomatic. Misses evening dose of Torsemide a couple of times week.  Drinks > 2 liters a day due to hot weather. Advised to follow fluid and salt intake limitation and compliance with Torsemide as prescribed.  Advised increase risk of hospitalization when skipping evening doses and fluid intake > 2 liters daily.   Thoracic impedance abnormal suggesting fluid accumulation 04/12/2016 to 05/07/2016.      LABS: 12/13/2015 Creatinine 1.47, BUN 25, Potassium 4.2, Sodium 136  11/21/2015 Creatinine 1.38, BUN 32, Potassium 3.9, Sodium 137 11/17/2015 Creatinine 2.04, BUN 50, Potassium 3.2, Sodium 138 11/09/2015 Creatinine 1.69, BUN 35, Potassium 3.9, Sodium 136  10/25/2015 Creatinine 1.91, BUN 44, Potassium 3.1, Sodium 135 From 06/01/2014 to 10/19/2015 Creatinine has ranged from 1.34 to 2.15  Recommendations: Patient manages fluid with metolazone as needed.  He will take 1 Metolazone tablet today.  Will recheck fluid levels on 05/12/2016.  Advised to call if he has any problems with taking Metolazone.  He has ICM number.    ICM trend: 05/07/2016     Follow-up plan: ICM clinic phone appointment on 05/12/2016.  Copy of ICM check sent to primary cardiologist and device physician for review and any further recommendations.   Karie Soda, RN 05/07/2016 10:13 AM

## 2016-05-12 ENCOUNTER — Telehealth (HOSPITAL_COMMUNITY): Payer: Self-pay | Admitting: *Deleted

## 2016-05-12 ENCOUNTER — Other Ambulatory Visit (HOSPITAL_COMMUNITY): Payer: Self-pay | Admitting: *Deleted

## 2016-05-12 ENCOUNTER — Telehealth (HOSPITAL_COMMUNITY): Payer: Self-pay | Admitting: Vascular Surgery

## 2016-05-12 ENCOUNTER — Ambulatory Visit (INDEPENDENT_AMBULATORY_CARE_PROVIDER_SITE_OTHER): Payer: Medicare Other

## 2016-05-12 DIAGNOSIS — I5022 Chronic systolic (congestive) heart failure: Secondary | ICD-10-CM

## 2016-05-12 DIAGNOSIS — Z95810 Presence of automatic (implantable) cardiac defibrillator: Secondary | ICD-10-CM

## 2016-05-12 MED ORDER — TORSEMIDE 20 MG PO TABS: 40.0000 mg | ORAL_TABLET | Freq: Two times a day (BID) | ORAL | 3 refills | Status: DC

## 2016-05-12 MED ORDER — BISOPROLOL FUMARATE 5 MG PO TABS: 5.0000 mg | ORAL_TABLET | Freq: Every day | ORAL | 5 refills | Status: DC

## 2016-05-12 NOTE — Progress Notes (Signed)
EPIC Encounter for ICM Monitoring  Patient Name: Gary Hutchinson is a 55 y.o. male Date: 05/12/2016 Primary Care Physican: [name], MD Primary Cardiologist: [name] Electrophysiologist: Allred Dry Weight: 292 lb  Bi-V Pacing:  97.7%       Heart Failure questions reviewed, pt asymptomatic.  Patient reported reason for missing his medications was due to he was sick and missed several days.     Since 05/07/2016 ICM transmission, thoracic impedance returned to normal after taking 1 dose of Metolazone on 05/07/2016.  LABS: 12/13/2015 Creatinine 1.47, BUN 25, Potassium 4.2, Sodium 136  11/21/2015 Creatinine 1.38, BUN 32, Potassium 3.9, Sodium 137 11/17/2015 Creatinine 2.04, BUN 50, Potassium 3.2, Sodium 138 11/09/2015 Creatinine 1.69, BUN 35, Potassium 3.9, Sodium 136  10/25/2015 Creatinine 1.91, BUN 44, Potassium 3.1, Sodium 135 From 06/01/2014 to 10/19/2015 Creatinine has ranged from 1.34 to 2.15   Recommendations: No changes.  Low sodium diet education provided   ICM trend: 05/12/2016     Follow-up plan: ICM clinic phone appointment on 06/10/2016.  Copy of ICM check sent to primary cardiologist and device physician for update transmission and impedance is back to normal.   Karie Soda, RN 05/12/2016 8:53 AM

## 2016-05-12 NOTE — Telephone Encounter (Signed)
Sent regina griffin message to call pt to make echo appt @ [facility]

## 2016-05-12 NOTE — Telephone Encounter (Signed)
-----   Message from Hillis Range, MD sent at 05/12/2016 12:36 AM EDT ----- He is overdue for follow-up with CHF clinic. Could you also obtain an echo to evaluate response to CRT prior to your next visit? Thanks!

## 2016-05-12 NOTE — Telephone Encounter (Signed)
Order placed for echo, will arrange f/u appts

## 2016-06-02 ENCOUNTER — Other Ambulatory Visit (HOSPITAL_COMMUNITY): Payer: Medicare Other

## 2016-06-03 ENCOUNTER — Other Ambulatory Visit: Payer: Self-pay

## 2016-06-03 ENCOUNTER — Ambulatory Visit (HOSPITAL_COMMUNITY): Payer: Medicare Other | Attending: Cardiology

## 2016-06-03 ENCOUNTER — Encounter (INDEPENDENT_AMBULATORY_CARE_PROVIDER_SITE_OTHER): Payer: Self-pay

## 2016-06-03 DIAGNOSIS — I110 Hypertensive heart disease with heart failure: Secondary | ICD-10-CM | POA: Insufficient documentation

## 2016-06-03 DIAGNOSIS — E785 Hyperlipidemia, unspecified: Secondary | ICD-10-CM | POA: Diagnosis not present

## 2016-06-03 DIAGNOSIS — I428 Other cardiomyopathies: Secondary | ICD-10-CM | POA: Insufficient documentation

## 2016-06-03 DIAGNOSIS — E119 Type 2 diabetes mellitus without complications: Secondary | ICD-10-CM | POA: Insufficient documentation

## 2016-06-03 DIAGNOSIS — I340 Nonrheumatic mitral (valve) insufficiency: Secondary | ICD-10-CM | POA: Insufficient documentation

## 2016-06-03 DIAGNOSIS — I071 Rheumatic tricuspid insufficiency: Secondary | ICD-10-CM | POA: Insufficient documentation

## 2016-06-03 DIAGNOSIS — Z6841 Body Mass Index (BMI) 40.0 and over, adult: Secondary | ICD-10-CM | POA: Diagnosis not present

## 2016-06-03 DIAGNOSIS — I5022 Chronic systolic (congestive) heart failure: Secondary | ICD-10-CM | POA: Diagnosis not present

## 2016-06-03 DIAGNOSIS — I509 Heart failure, unspecified: Secondary | ICD-10-CM | POA: Diagnosis present

## 2016-06-04 DIAGNOSIS — I110 Hypertensive heart disease with heart failure: Secondary | ICD-10-CM | POA: Diagnosis not present

## 2016-06-04 MED ORDER — PERFLUTREN LIPID MICROSPHERE
1.0000 mL | INTRAVENOUS | Status: AC | PRN
Start: 2016-06-04 — End: 2016-06-04
  Administered 2016-06-03: 2 mL via INTRAVENOUS

## 2016-06-04 MED ORDER — PERFLUTREN LIPID MICROSPHERE: 1.0000 mL | INTRAVENOUS | Status: AC | PRN

## 2016-06-09 ENCOUNTER — Other Ambulatory Visit (HOSPITAL_COMMUNITY): Payer: Self-pay | Admitting: Adult Health

## 2016-06-09 DIAGNOSIS — I4892 Unspecified atrial flutter: Secondary | ICD-10-CM

## 2016-06-10 ENCOUNTER — Ambulatory Visit (INDEPENDENT_AMBULATORY_CARE_PROVIDER_SITE_OTHER): Payer: Medicare Other

## 2016-06-10 DIAGNOSIS — I5022 Chronic systolic (congestive) heart failure: Secondary | ICD-10-CM | POA: Diagnosis not present

## 2016-06-10 DIAGNOSIS — Z95810 Presence of automatic (implantable) cardiac defibrillator: Secondary | ICD-10-CM

## 2016-06-10 NOTE — Progress Notes (Signed)
EPIC Encounter for ICM Monitoring  Patient Name: Gary Hutchinson is a 55 y.o. male Date: 06/10/2016 Primary Care Physican: [name], MD Primary Cardiologist: [name] Electrophysiologist: Allred Dry Weight: 286 lb  Bi-V Pacing:  83.1% (decreased from 97.7% since 05/12/2016)     Treated VT/VF 0 episodes  AT/AF 89 episodes  Time in AT/AF 6.4 hr/day (26.5%) Event Summary: AT/AF Daily Burden > Threshold  ?V. Pacing < 90%  ?4747 V. Sensing Episodes  ?8 days in AT/AF Since Last Session  ( 05/07/2016 showed 3 seconds in AT/AF)   Heart Failure questions reviewed, pt asymptomatic but reported he was not feeling well last week.  He did not have specific symptoms but did have an EKG done last week and was told it was normal.    Thoracic impedance normal since 06/03/2016.  HF office appointment 06/12/2016.  Recommendations: No changes.      Follow-up plan: ICM clinic phone appointment on 07/15/2016.  Copy of ICM check sent to primary cardiologist and device physician.   ICM trend: 06/10/2016        Karie Soda, RN 06/10/2016 1:54 PM

## 2016-06-12 ENCOUNTER — Telehealth (HOSPITAL_COMMUNITY): Payer: Self-pay | Admitting: Cardiology

## 2016-06-12 ENCOUNTER — Ambulatory Visit (HOSPITAL_COMMUNITY)
Admission: RE | Admit: 2016-06-12 | Discharge: 2016-06-12 | Disposition: A | Discharge status: Home / Self Care | Payer: Medicare Other | Source: Ambulatory Visit | Attending: Internal Medicine | Admitting: Internal Medicine

## 2016-06-12 VITALS — BP 140/62 | HR 72 | Wt 285.6 lb

## 2016-06-12 DIAGNOSIS — Z79899 Other long term (current) drug therapy: Secondary | ICD-10-CM | POA: Insufficient documentation

## 2016-06-12 DIAGNOSIS — G4733 Obstructive sleep apnea (adult) (pediatric): Secondary | ICD-10-CM

## 2016-06-12 DIAGNOSIS — I130 Hypertensive heart and chronic kidney disease with heart failure and stage 1 through stage 4 chronic kidney disease, or unspecified chronic kidney disease: Secondary | ICD-10-CM | POA: Insufficient documentation

## 2016-06-12 DIAGNOSIS — E1122 Type 2 diabetes mellitus with diabetic chronic kidney disease: Secondary | ICD-10-CM | POA: Diagnosis present

## 2016-06-12 DIAGNOSIS — N183 Chronic kidney disease, stage 3 (moderate): Secondary | ICD-10-CM | POA: Insufficient documentation

## 2016-06-12 DIAGNOSIS — I509 Heart failure, unspecified: Secondary | ICD-10-CM

## 2016-06-12 DIAGNOSIS — Z7901 Long term (current) use of anticoagulants: Secondary | ICD-10-CM | POA: Diagnosis not present

## 2016-06-12 DIAGNOSIS — I480 Paroxysmal atrial fibrillation: Secondary | ICD-10-CM | POA: Diagnosis not present

## 2016-06-12 DIAGNOSIS — Z794 Long term (current) use of insulin: Secondary | ICD-10-CM | POA: Diagnosis not present

## 2016-06-12 DIAGNOSIS — I5022 Chronic systolic (congestive) heart failure: Secondary | ICD-10-CM | POA: Diagnosis present

## 2016-06-12 DIAGNOSIS — I4892 Unspecified atrial flutter: Secondary | ICD-10-CM | POA: Diagnosis not present

## 2016-06-12 DIAGNOSIS — Z95810 Presence of automatic (implantable) cardiac defibrillator: Secondary | ICD-10-CM | POA: Insufficient documentation

## 2016-06-12 DIAGNOSIS — I10 Essential (primary) hypertension: Secondary | ICD-10-CM

## 2016-06-12 LAB — BASIC METABOLIC PANEL
ANION GAP: 10 (ref 5–15)
BUN: 38 mg/dL — AB (ref 6–20)
CO2: 27 mmol/L (ref 22–32)
Calcium: 9.6 mg/dL (ref 8.9–10.3)
Chloride: 104 mmol/L (ref 101–111)
Creatinine, Ser: 1.34 mg/dL — ABNORMAL HIGH (ref 0.61–1.24)
GFR calc Af Amer: >60 mL/min (ref >60)
GFR calc non Af Amer: 58 mL/min — ABNORMAL LOW (ref >60)
GLUCOSE: 153 mg/dL — AB (ref 65–99)
POTASSIUM: 3.4 mmol/L — AB (ref 3.5–5.1)
Sodium: 141 mmol/L (ref 135–145)

## 2016-06-12 LAB — HEPATIC FUNCTION PANEL
ALBUMIN: 3.3 g/dL — AB (ref 3.5–5.0)
ALK PHOS: 115 U/L (ref 38–126)
ALT: 25 U/L (ref 17–63)
AST: 25 U/L (ref 15–41)
BILIRUBIN DIRECT: 0.3 mg/dL (ref 0.1–0.5)
BILIRUBIN TOTAL: 1.2 mg/dL (ref 0.3–1.2)
Indirect Bilirubin: 0.9 mg/dL (ref 0.3–0.9)
Total Protein: 7.2 g/dL (ref 6.5–8.1)

## 2016-06-12 LAB — TSH: TSH: <0.01 u[IU]/mL — ABNORMAL LOW (ref 0.350–4.500)

## 2016-06-12 LAB — MAGNESIUM: Magnesium: 2.1 mg/dL (ref 1.7–2.4)

## 2016-06-12 MED ORDER — AMIODARONE HCL 200 MG PO TABS
200.0000 mg | ORAL_TABLET | Freq: Two times a day (BID) | ORAL | 6 refills | Status: DC
Start: 2016-06-12 — End: 2016-06-13

## 2016-06-12 MED ORDER — POTASSIUM CHLORIDE CRYS ER 20 MEQ PO TBCR: 40.0000 meq | EXTENDED_RELEASE_TABLET | Freq: Two times a day (BID) | ORAL | 3 refills | Status: DC

## 2016-06-12 NOTE — Patient Instructions (Signed)
Labs today  INCREASE Amiodarone to 200 mg, one tab twice a day  Your physician recommends that you schedule a follow-up appointment in: 2 weeks with Otilio Saber

## 2016-06-12 NOTE — Progress Notes (Signed)
Patient ID: Gary Hutchinson, male   DOB: 1961-05-25, 55 y.o.   MRN: [id number]  [facility]  PCP: Creola Corn EP: Hillis Range  HPI: [name] is a 55 year old male with a history of chronic systolic heart failure, ICM s/p ICD, HTN, DM II, OSA on CPAP, history of ventricular tachycardia status post AICD placement in 2009, atrial fibrillation and morbid obesity.    Admitted 6/1-03/13/14 for A/C HF and cardiogenic shock (co-ox 42%). Diuresed with IV lasix and milrinone which were weaned off.  He went into Afib with RVR and chemically cardioverted back to NSR with IV amiodarone. Placed on Xarelto. BB restarted 1/2 dose and held ACE-I. His blood sugars were elevated along with Hgb A1C and started on insulin. Discharge weight 344 lbs.   RHC (05/2014): Left sided filling pressures well compensated, mild pulm HTN with elevated right-sided pressures and mod/severely depressed CO. Discussion about trial of milrinone but patient wanted to hold off.  Admitted January 2017 with volume overload and cellulitis. Diuresed with IV lasix and required short term milrinone. Loaded on amio and had successful DC-CV on  10/17/2015.  Discharge weight was 307 pounds.   He presents today for regular follow up. At last visit we increased hydralazine.  Had follow up with pharmacy and had lost 14 lbs. Pt refused addition of ACE/ARB at that visit. Has been having "flares" in his chest, just a funny feeling over the past few days. No chest pain, maybe feels like palpitations. By optivol has been in afib over the past week. Takes a metolazone every 2-3 weeks. Denies any SOB/PND/Orthopnea. No bendopnea.  Weight at home 281.2.   Planning on Taking all medications. No BRBPR.    Optivol - Fluid below threshold with no recent crossings. Active between 1-2 hours daily. Has been in Afib for upwards of a week. Between 16-24 hrs of Afib daily over past week.     05/03/12: EF 25-30%.  Grade 1 diastolic dysfunction.  Mild MR.   Mod dilated LA.   07/27/13: EF 40%, diff HK, MR mild, LA mild/mod dilated  03/2014: EF30-35%, grade II DD, mild MR, RV systolic fx mildly decreased 10/2014: EF 40%.  10/2015: EF ~40%. RV mildly dilated.    Labs  03/08/13 K 3.8 Creatinine 1.02  07/27/13 K 4.7 Creatinine 1.61 Dig level 0.9 ---> dig cut back to 0.125 mg every other day 10/10/13 K 3.7 Creatinine 1.4  03/17/14: K 3.8, Creatinine 1.8, BUN 29 (PCP office) 05/10/14: K 4.0 Cr 2.4 11/17/15: K 3.2, Creatinine 2.04 05/18/14: K 4.1, Cr 1.8, BUN 34, dig level 0.7,  06/06/14: K 4.2 Creatinine 1.34  08/21/2014: K 4.1 Creatinine 1.46 Dig level 0.7 10/19/2015: K 3.6 Creatinine 2.15  11/21/15: K 3.9, Creatinine 1.38  SH: Lives with son in [place]. Disabled. No ETOH or tobacco abuse  FH; Mother living: HT        Father deceased: was not part of his life so not sure health issues  ROS: All systems negative except as listed in HPI, PMH and Problem List.  Past Medical History:  Diagnosis Date  . AICD (automatic cardioverter/defibrillator) present   . Alcohol abuse    now quit  . Anemia    iron defi  . Arthritis    "fingers, knees, some days all my joints ache" (11/16/2015)  . Asthma   . Cholecystitis   . Chronic systolic congestive heart failure (HCC)    a. RHC (02/2011) RA 31/27, RV 71/27, PA67/45, PCWP 46, PA  49%, Fick CO: 3.4 b. ECHO (03/2014) EF 30-35%, grade II DD, mild MR, RV poorly visualized appears mildly decreased c. RHC (05/2014) RA 13, RV 54/4/11, PA 60/22 (37), PCWP 17, Fick CO/CI: 4.4 / 1.9, Thermo CO/CI: 3.8 /1.6, PVR 5.2 WU, PA 57% and 59%  . Diverticulosis of colon   . Gout   . Hemorrhoids, internal   . Hyperlipidemia   . Hypertension   . Morbid obesity (HCC)   . Nonischemic cardiomyopathy (HCC)    a. LHC (02/2011) normal coronaries  . OSA on CPAP   . Paroxysmal atrial fibrillation (HCC)   . Pneumonia 2000s X 1  . Pulmonary hypertension (HCC)   . Renal insufficiency   . Type II diabetes mellitus (HCC)   . Ventricular  tachycardia [facility](HCC)    s/p MDT ICD implant    Current Outpatient Prescriptions  Medication Sig Dispense Refill  . acetaminophen (TYLENOL) 325 MG tablet Take 650 mg by mouth every 6 (six) hours as needed (pain). Reported on 10/11/2015    . albuterol (PROAIR HFA) 108 (90 BASE) MCG/ACT inhaler Inhale 2 puffs into the lungs every 6 (six) hours as needed for wheezing or shortness of breath. Reported on 02/06/2016    . allopurinol (ZYLOPRIM) 300 MG tablet Take 300 mg by mouth daily.    Marland Kitchen. amiodarone (PACERONE) 200 MG tablet TAKE 1 TABLET (200 MG TOTAL) BY MOUTH DAILY. 30 tablet 6  . atorvastatin (LIPITOR) 40 MG tablet Take 1 tablet (40 mg total) by mouth daily. 90 tablet 6  . bisoprolol (ZEBETA) 5 MG tablet Take 1 tablet (5 mg total) by mouth daily. 30 tablet 5  . colchicine 0.6 MG tablet Take 0.6 mg by mouth daily as needed (Gout). Reported on 02/06/2016    . digoxin (LANOXIN) 0.125 MG tablet Take 0.5 tablets (0.0625 mg total) by mouth every other day. 45 tablet 3  . Fluticasone-Salmeterol (ADVAIR DISKUS) 250-50 MCG/DOSE AEPB Inhale 1 puff into the lungs every 12 (twelve) hours as needed (shortness of breath). Reported on 02/06/2016    . hydrALAZINE (APRESOLINE) 50 MG tablet Take 1.5 tablets (75 mg total) by mouth 2 (two) times daily. 90 tablet 6  . insulin detemir (LEVEMIR) 100 UNIT/ML injection Inject 36 Units into the skin at bedtime.     . isosorbide mononitrate (IMDUR) 30 MG 24 hr tablet TAKE 1 TABLET (30 MG TOTAL) BY MOUTH 2 (TWO) TIMES DAILY. 180 tablet 3  . linagliptin (TRADJENTA) 5 MG TABS tablet Take 1 tablet (5 mg total) by mouth daily. 30 tablet 3  . metolazone (ZAROXOLYN) 2.5 MG tablet Take 2.5 mg by mouth daily as needed (edema). Reported on 02/06/2016    . montelukast (SINGULAIR) 10 MG tablet Take 10 mg by mouth daily as needed. For shortness of breath or wheezing    . potassium chloride SA (K-DUR,KLOR-CON) 20 MEQ tablet Take 40  mEq (2 tabs) by mouth in the AM and 20 mEq (1 tab) in the PM    .  rivaroxaban (XARELTO) 20 MG TABS tablet Take 1 tablet (20 mg total) by mouth daily. 30 tablet 0  . spironolactone (ALDACTONE) 25 MG tablet Take 1/2 tablet by mouth once a day    . torsemide (DEMADEX) 20 MG tablet Take 2 tablets (40 mg total) by mouth 2 (two) times daily. 120 tablet 3   No current facility-administered medications for this encounter.     Vitals:   06/12/16 1014  BP: 140/62  BP Location: Left Arm  Patient Position: Sitting  Cuff Size: Large  Pulse: 72  SpO2: 92%  Weight: 285 lb 9.6 oz (129.5 kg)   Wt Readings from Last 3 Encounters:  06/12/16 285 lb 9.6 oz (129.5 kg)  04/02/16 297 lb 3.2 oz (134.8 kg)  02/06/16 293 lb 9.6 oz (133.2 kg)     PHYSICAL EXAM: General:  Well appearing. No resp difficulty, NAD. Ambulated in the clinic without difficulty.  HEENT: normal Neck: Thick. JVP difficult to assess d/t body but appears mildly elevated; Carotids 2+ bilaterally; no bruits. No thyromegaly or nodule noted.  Cor: PMI nonpalpable. RRR. No rubs or gallops. 2/6 TR murmur. 2/6 MR Lungs: CTAB, normal effort Abdomen: obese, soft, nontender, non-distended.  Good bowel sounds. Extremities: no cyanosis, clubbing, rash, tr-1+ edema. Neuro: alert & orientedx3, cranial nerves grossly intact. Moves all 4 extremities w/o difficulty. Affect pleasant.  EKG: A-sensed Vpaced 69 bmp.   ASSESSMENT & PLAN:   1) Chronic systolic HF: NICM s/p ICD Medtronic, EF 40% 10/2015. Now s/p BiV upgrade.  - NYHA II symptoms and volume status stable. Continue torsemide 40 mg twice a day + 20 meq potassium daily.  - Continue bisoprolol 5 mg daily. HR too low to titrate  - Continue digoxin 0.0625 mg every other day. - Continue hydralazine 75 mg twice a day + Imdur 30 mg BID - Will not add ace due to recent AKI. Consider at pharmacy visit.  - Reinforced the need and importance of daily weights, a low sodium diet, and fluid restriction (less than 2 L a day). Instructed to call the HF clinic if weight  increases more than 3 lbs overnight or 5 lbs in a week.  - Will schedule for repeat echo after follow up.   2) CKD stage III -  Followed by nephrology. Creatinine baseline 1.8-2.1 - BMET today.   3) HTN:  - Mildly elevated, hasn't had meds yet this am.  4) OSA - Continue to wear CPAP nightly 5) PAF-  S/P Succesful DC-CV 10/17/2015  New to patient 03/2014.  Recent admit back in A fib RVR and had successful DC/CV on 10/17/15.  - Back in NSR by EKG today.  - Increase amio to 200 mg BID until follow up.   - Continue Xarelto 20. He states he has not missed any doses.  - TSH and LFTs stable 3/17. Recheck today.  - Needs yearly eye exam and he is aware.  6) Obesity:   - Continued to encouraged to lose weight and increase activity as able.   CMET and TSH today.  Follow up 2 weeks. Med changes as above.   Mariam Dollar Rocio Roam [name] 06/12/2016 10:18 AM   Total time spent > 25 minutes. Over half that spent discussing the above.

## 2016-06-12 NOTE — Telephone Encounter (Signed)
-----   Message from Graciella Freer, [name] sent at 06/12/2016  2:56 PM EDT ----- Increase potassium to 40 BID.  TSH markedly low.   Needs to return tomorrow for Repeat TSH with Free T3/T4.  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    [name] [address]" [place], [name] 06/12/2016 2:55 PM

## 2016-06-13 ENCOUNTER — Ambulatory Visit (HOSPITAL_COMMUNITY)
Admission: RE | Admit: 2016-06-13 | Discharge: 2016-06-13 | Disposition: A | Discharge status: Home / Self Care | Payer: Medicare Other | Source: Ambulatory Visit | Attending: Cardiology | Admitting: Cardiology

## 2016-06-13 ENCOUNTER — Telehealth (HOSPITAL_COMMUNITY): Payer: Self-pay | Admitting: *Deleted

## 2016-06-13 DIAGNOSIS — I5022 Chronic systolic (congestive) heart failure: Secondary | ICD-10-CM

## 2016-06-13 LAB — T4, FREE
FREE T4: 3.64 ng/dL — AB (ref 0.61–1.12)
FREE T4: 3.67 ng/dL — AB (ref 0.61–1.12)

## 2016-06-13 LAB — TSH: TSH: 0.007 u[IU]/mL — AB (ref 0.350–4.500)

## 2016-06-13 MED ORDER — METHIMAZOLE 10 MG PO TABS
10.0000 mg | ORAL_TABLET | Freq: Three times a day (TID) | ORAL | 0 refills | Status: DC
Start: 2016-06-13 — End: 2016-07-10

## 2016-06-13 NOTE — Telephone Encounter (Signed)
Notes Recorded by Modesta Messing, CMA on 06/13/2016 at 2:13 PM EDT Patient aware. Labs routed to PCP. Methimazole sent to patients pharmacy.   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 STOP Amiodarone. Please start on methimazole 10 mg TID.   Needs to see PCP, Creola Corn, MD, and be referred to Endo.   [name] "Mardelle Matte" [place], [name] 06/13/2016 10:16 AM    Ref Range & Units 08:30 1d ago 53mo ago   TSH 0.350 - 4.500 uIU/mL 0.007  <0.010  2.794  Resulting Agency  [facility]

## 2016-06-14 LAB — T3, FREE
T3, Free: 5.8 pg/mL — ABNORMAL HIGH (ref 2.0–4.4)
T3, Free: 5.8 pg/mL — ABNORMAL HIGH (ref 2.0–4.4)

## 2016-06-17 ENCOUNTER — Encounter: Payer: Self-pay | Admitting: Internal Medicine

## 2016-06-24 ENCOUNTER — Ambulatory Visit (HOSPITAL_COMMUNITY)
Admission: RE | Admit: 2016-06-24 | Discharge: 2016-06-24 | Disposition: A | Discharge status: Home / Self Care | Payer: Medicare Other | Source: Ambulatory Visit | Attending: Cardiology | Admitting: Cardiology

## 2016-06-24 VITALS — BP 160/82 | HR 87 | Wt 298.8 lb

## 2016-06-24 DIAGNOSIS — Z794 Long term (current) use of insulin: Secondary | ICD-10-CM | POA: Diagnosis not present

## 2016-06-24 DIAGNOSIS — I10 Essential (primary) hypertension: Secondary | ICD-10-CM

## 2016-06-24 DIAGNOSIS — I429 Cardiomyopathy, unspecified: Secondary | ICD-10-CM | POA: Insufficient documentation

## 2016-06-24 DIAGNOSIS — I480 Paroxysmal atrial fibrillation: Secondary | ICD-10-CM | POA: Diagnosis not present

## 2016-06-24 DIAGNOSIS — M109 Gout, unspecified: Secondary | ICD-10-CM | POA: Insufficient documentation

## 2016-06-24 DIAGNOSIS — J45909 Unspecified asthma, uncomplicated: Secondary | ICD-10-CM | POA: Diagnosis not present

## 2016-06-24 DIAGNOSIS — I130 Hypertensive heart and chronic kidney disease with heart failure and stage 1 through stage 4 chronic kidney disease, or unspecified chronic kidney disease: Secondary | ICD-10-CM | POA: Diagnosis not present

## 2016-06-24 DIAGNOSIS — Z7901 Long term (current) use of anticoagulants: Secondary | ICD-10-CM | POA: Diagnosis not present

## 2016-06-24 DIAGNOSIS — T462X5A Adverse effect of other antidysrhythmic drugs, initial encounter: Secondary | ICD-10-CM | POA: Insufficient documentation

## 2016-06-24 DIAGNOSIS — E785 Hyperlipidemia, unspecified: Secondary | ICD-10-CM | POA: Diagnosis not present

## 2016-06-24 DIAGNOSIS — I5022 Chronic systolic (congestive) heart failure: Secondary | ICD-10-CM | POA: Diagnosis not present

## 2016-06-24 DIAGNOSIS — N183 Chronic kidney disease, stage 3 (moderate): Secondary | ICD-10-CM | POA: Diagnosis not present

## 2016-06-24 DIAGNOSIS — E0580 Other thyrotoxicosis without thyrotoxic crisis or storm: Secondary | ICD-10-CM

## 2016-06-24 DIAGNOSIS — Z95810 Presence of automatic (implantable) cardiac defibrillator: Secondary | ICD-10-CM

## 2016-06-24 DIAGNOSIS — G4733 Obstructive sleep apnea (adult) (pediatric): Secondary | ICD-10-CM | POA: Diagnosis not present

## 2016-06-24 DIAGNOSIS — E1122 Type 2 diabetes mellitus with diabetic chronic kidney disease: Secondary | ICD-10-CM | POA: Diagnosis not present

## 2016-06-24 DIAGNOSIS — K5730 Diverticulosis of large intestine without perforation or abscess without bleeding: Secondary | ICD-10-CM | POA: Insufficient documentation

## 2016-06-24 DIAGNOSIS — I509 Heart failure, unspecified: Secondary | ICD-10-CM | POA: Diagnosis present

## 2016-06-24 DIAGNOSIS — I472 Ventricular tachycardia: Secondary | ICD-10-CM | POA: Diagnosis not present

## 2016-06-24 LAB — BASIC METABOLIC PANEL
ANION GAP: 8 (ref 5–15)
BUN: 27 mg/dL — ABNORMAL HIGH (ref 6–20)
CALCIUM: 9.3 mg/dL (ref 8.9–10.3)
CO2: 23 mmol/L (ref 22–32)
Chloride: 106 mmol/L (ref 101–111)
Creatinine, Ser: 1.16 mg/dL (ref 0.61–1.24)
GLUCOSE: 190 mg/dL — AB (ref 65–99)
Potassium: 3.7 mmol/L (ref 3.5–5.1)
Sodium: 137 mmol/L (ref 135–145)

## 2016-06-24 LAB — BRAIN NATRIURETIC PEPTIDE: B Natriuretic Peptide: 338.1 pg/mL — ABNORMAL HIGH (ref 0.0–100.0)

## 2016-06-24 MED ORDER — RIVAROXABAN 20 MG PO TABS: 20.0000 mg | ORAL_TABLET | Freq: Every day | ORAL | 11 refills | Status: DC

## 2016-06-24 MED ORDER — HYDRALAZINE HCL 50 MG PO TABS: 50.0000 mg | ORAL_TABLET | Freq: Three times a day (TID) | ORAL | 6 refills | Status: DC

## 2016-06-24 NOTE — Progress Notes (Signed)
Advanced Heart Failure Medication Review by a Pharmacist  Does the patient  feel that his/her medications are working for him/her?  yes  Has the patient been experiencing any side effects to the medications prescribed?  no  Does the patient measure his/her own blood pressure or blood glucose at home?  yes   Does the patient have any problems obtaining medications due to transportation or finances?   no  Understanding of regimen: good Understanding of indications: good Potential of compliance: good Patient understands to avoid NSAIDs. Patient understands to avoid decongestants.  Issues to address at subsequent visits: None   Pharmacist comments:  Gary Hutchinson is a pleasant 55 yo M presenting without a medication list but with good recall of his regimen. He reports good compliance with his regimen and did not have any specific medication-related questions or concerns for me at this time. I did notice that he is taking Xarelto 15 mg daily and I am unsure why he is taking this dose and not 20 mg daily. His CrCl is > 100 ml/min and I don't see a h/o bleeding noted. I called to verify his dosage with [facility] who stated that they don't have any record of a Xarelto Rx for him. He states that he has been taking the 15 mg tablets for a while now and was not sure why he was changed from the 20 mg tablets. He states that he has never had any bleeding issues. After discussion with Otilio Saber, [name] will switch him back to 20 mg daily dose.   Gary Hutchinson. Gary Hutchinson, PharmD, BCPS, CPP Clinical Pharmacist Pager: [phone] Phone: [phone] 06/24/2016 2:40 PM      Time with patient: 10 minutes Preparation and documentation time:  10 minutes Total time: 20 minutes

## 2016-06-24 NOTE — Progress Notes (Signed)
Patient ID: Gary Hutchinson, male   DOB: 05/02/1961, 55 y.o.   MRN: [id number]  [facility]  PCP: Creola Corn EP: Hillis Range  HPI: Mr. Leve is a 55 year old male with a history of chronic systolic heart failure, ICM s/p ICD, HTN, DM II, OSA on CPAP, history of ventricular tachycardia status post AICD placement in 2009, atrial fibrillation and morbid obesity.    Admitted 6/1-03/13/14 for A/C HF and cardiogenic shock (co-ox 42%). Diuresed with IV lasix and milrinone which were weaned off.  He went into Afib with RVR and chemically cardioverted back to NSR with IV amiodarone. Placed on Xarelto. BB restarted 1/2 dose and held ACE-I. His blood sugars were elevated along with Hgb A1C and started on insulin. Discharge weight 344 lbs.   RHC (05/2014): Left sided filling pressures well compensated, mild pulm HTN with elevated right-sided pressures and mod/severely depressed CO. Discussion about trial of milrinone but patient wanted to hold off.  Admitted January 2017 with volume overload and cellulitis. Diuresed with IV lasix and required short term milrinone. Loaded on amio and had successful DC-CV on  10/17/2015.  Discharge weight was 307 pounds.   He presents today for regular follow up. At last visit was found to have been having more PAF. Amiodarone initially increased, but labwork consistent with amiodarone induced thyrotoxicity. Started on methimazole. Has not had follow up with Dr. [name] yet. Has not felt any further palpitations, and by Optivol has had no further A fib since last visit.  Breathing has been good.  His weight shows up 13 lbs from last visit, but doubt accuracy.  Weight stable at home and Optivol not elevated. SBPs at home 120-130s. Weight at home 291 lbs, thinks he miss spoke at last visit when he said 281. Taking all medication as directed. No BRBPR or Melena. Taking all medications (took his BP meds around 7 am this morning). Has only been taking Torsemide 30 mg q am  and 20 mg q pm.   Planning on Taking all medications. No BRBPR.    Optivol - Fluid status stable. No recent optivol crossings.  No further AT/AF alerts since last visit.     05/03/12: EF 25-30%.  Grade 1 diastolic dysfunction.  Mild MR.  Mod dilated LA.   07/27/13: EF 40%, diff HK, MR mild, LA mild/mod dilated  03/2014: EF30-35%, grade II DD, mild MR, RV systolic fx mildly decreased 10/2014: EF 40%.  10/2015: EF ~40%. RV mildly dilated.    Labs  03/08/13 K 3.8 Creatinine 1.02  07/27/13 K 4.7 Creatinine 1.61 Dig level 0.9 ---> dig cut back to 0.125 mg every other day 10/10/13 K 3.7 Creatinine 1.4  03/17/14: K 3.8, Creatinine 1.8, BUN 29 (PCP office) 05/10/14: K 4.0 Cr 2.4 11/17/15: K 3.2, Creatinine 2.04 05/18/14: K 4.1, Cr 1.8, BUN 34, dig level 0.7,  06/06/14: K 4.2 Creatinine 1.34  08/21/2014: K 4.1 Creatinine 1.46 Dig level 0.7 10/19/2015: K 3.6 Creatinine 2.15  11/21/15: K 3.9, Creatinine 1.38  SH: Lives with son in [place]. Disabled. No ETOH or tobacco abuse  FH; Mother living: HT        Father deceased: was not part of his life so not sure health issues  ROS: All systems negative except as listed in HPI, PMH and Problem List.  Past Medical History:  Diagnosis Date  . AICD (automatic cardioverter/defibrillator) present   . Alcohol abuse    now quit  . Anemia    iron defi  .  Arthritis    "fingers, knees, some days all my joints ache" (11/16/2015)  . Asthma   . Cholecystitis   . Chronic systolic congestive heart failure (HCC)    a. RHC (02/2011) RA 31/27, RV 71/27, PA67/45, PCWP 46, PA 49%, Fick CO: 3.4 b. ECHO (03/2014) EF 30-35%, grade II DD, mild MR, RV poorly visualized appears mildly decreased c. RHC (05/2014) RA 13, RV 54/4/11, PA 60/22 (37), PCWP 17, Fick CO/CI: 4.4 / 1.9, Thermo CO/CI: 3.8 /1.6, PVR 5.2 WU, PA 57% and 59%  . Diverticulosis of colon   . Gout   . Hemorrhoids, internal   . Hyperlipidemia   . Hypertension   . Morbid obesity (HCC)   . Nonischemic  cardiomyopathy (HCC)    a. LHC (02/2011) normal coronaries  . OSA on CPAP   . Paroxysmal atrial fibrillation (HCC)   . Pneumonia 2000s X 1  . Pulmonary hypertension (HCC)   . Renal insufficiency   . Type II diabetes mellitus (HCC)   . Ventricular tachycardia [facility])    s/p MDT ICD implant    Current Outpatient Prescriptions  Medication Sig Dispense Refill  . acetaminophen (TYLENOL) 325 MG tablet Take 650 mg by mouth every 6 (six) hours as needed (pain). Reported on 10/11/2015    . albuterol (PROAIR HFA) 108 (90 BASE) MCG/ACT inhaler Inhale 2 puffs into the lungs every 6 (six) hours as needed for wheezing or shortness of breath. Reported on 02/06/2016    . allopurinol (ZYLOPRIM) 300 MG tablet Take 300 mg by mouth daily.    Marland Kitchen atorvastatin (LIPITOR) 40 MG tablet Take 1 tablet (40 mg total) by mouth daily. 90 tablet 6  . bisoprolol (ZEBETA) 5 MG tablet Take 1 tablet (5 mg total) by mouth daily. 30 tablet 5  . digoxin (LANOXIN) 0.125 MG tablet Take 0.5 tablets (0.0625 mg total) by mouth every other day. 45 tablet 3  . Fluticasone-Salmeterol (ADVAIR DISKUS) 250-50 MCG/DOSE AEPB Inhale 1 puff into the lungs every 12 (twelve) hours as needed (shortness of breath). Reported on 02/06/2016    . hydrALAZINE (APRESOLINE) 50 MG tablet Take 1.5 tablets (75 mg total) by mouth 2 (two) times daily. 90 tablet 6  . insulin detemir (LEVEMIR) 100 UNIT/ML injection Inject 36 Units into the skin at bedtime.     . isosorbide mononitrate (IMDUR) 30 MG 24 hr tablet TAKE 1 TABLET (30 MG TOTAL) BY MOUTH 2 (TWO) TIMES DAILY. 180 tablet 3  . linagliptin (TRADJENTA) 5 MG TABS tablet Take 1 tablet (5 mg total) by mouth daily. 30 tablet 3  . methimazole (TAPAZOLE) 10 MG tablet Take 1 tablet (10 mg total) by mouth 3 (three) times daily. 90 tablet 0  . montelukast (SINGULAIR) 10 MG tablet Take 10 mg by mouth daily as needed. For shortness of breath or wheezing    . potassium chloride SA (K-DUR,KLOR-CON) 20 MEQ tablet Take 20-40 mEq  by mouth 2 (two) times daily. Take 40 meq (2 tablets) in the morning and 20 meq (1 tablet) in the afternoon    . Rivaroxaban (XARELTO) 15 MG TABS tablet Take 15 mg by mouth daily with supper.    Marland Kitchen spironolactone (ALDACTONE) 25 MG tablet Take 1/2 tablet by mouth once a day    . torsemide (DEMADEX) 20 MG tablet Take 2 tablets (40 mg total) by mouth 2 (two) times daily. 120 tablet 3  . colchicine 0.6 MG tablet Take 0.6 mg by mouth daily as needed (Gout). Reported on 02/06/2016    .  metolazone (ZAROXOLYN) 2.5 MG tablet Take 2.5 mg by mouth daily as needed (edema). Reported on 02/06/2016     No current facility-administered medications for this encounter.     Vitals:   06/24/16 1352  BP: (!) 160/82  BP Location: Left Arm  Patient Position: Sitting  Cuff Size: Large  Pulse: 87  SpO2: 93%  Weight: 298 lb 12.8 oz (135.5 kg)   Wt Readings from Last 3 Encounters:  06/24/16 298 lb 12.8 oz (135.5 kg)  06/12/16 285 lb 9.6 oz (129.5 kg)  04/02/16 297 lb 3.2 oz (134.8 kg)     PHYSICAL EXAM: General:  Well appearing. No resp difficulty, NAD. Ambulated in the clinic without difficulty.  HEENT: normal Neck: Thick. JVP difficult to assess d/t body but appears mildly elevated; Carotids 2+ bilaterally; no bruits. No thyromegaly or nodule noted.  Cor: PMI nonpalpable. RRR. No rubs or gallops. 2/6 TR murmur. 2/6 MR Lungs: Clear, normal effort Abdomen: obese, soft, NT, ND, no HSM. No bruits or masses. +BS  Extremities: no cyanosis, clubbing, rash, 1+ edema. Neuro: alert & orientedx3, cranial nerves grossly intact. Moves all 4 extremities w/o difficulty. Affect pleasant.   ASSESSMENT & PLAN:   1) Chronic systolic HF: NICM s/p ICD Medtronic, EF 40% 10/2015. Now s/p BiV upgrade.  - NYHA II symptoms  - Volume status appears mildly elevated, but he has not been taking his Torsemide correctly - Restart torsemide 40 mg BOD ( For some reason had been doing 30 mg am 20 mg pm). - Continue 40 meq potassium daily  for now. BMET today.  - Continue bisoprolol 5 mg daily.  - Continue digoxin 0.0625 mg every other day. - Change hydralazine to 50 mg TID. Continue Imdur 30 mg BID - Will not add ace due to recent AKI. Consider at pharmacy visit.  - Reinforced the need and importance of daily weights, a low sodium diet, and fluid restriction (less than 2 L a day). Instructed to call the HF clinic if weight increases more than 3 lbs overnight or 5 lbs in a week.  - Needs repeat Echo, will schedule after next visit/after thyroid addressed.  2) CKD stage III -  Followed by nephrology. Creatinine baseline 1.8-2.1 - BMET today.   3) HTN:  - Mildly elevated today, has been stable at home. - Changing hydralazine to 50 mg TID dosing as above.  4) OSA - Continue to wear CPAP nightly 5) PAF-  S/P Succesful DC-CV 10/17/2015  (Initial episode 03/2014) Recent admit back in A fib RVR and had successful DC/CV on 10/17/15.  - Remains in NSR by Optivol interrogation today.  - Continue Xarelto 20. He states he has not missed any doses.  - LFTs stable 9/17.  - Needs yearly eye exam and he is aware.  - Now off amiodarone with thyrotoxicity. Will send to EP for alternate rhythm control strategy. Needs PCP follow up ASAP.  6) Obesity:   - Continued to encouraged to lose weight and increase activity as able.  7) Amiodarone thyroxicity  - Off Amiodarone. - Continue Methimazole 10 mg TID for now.  Needs to follow up with PCP ASAP.  - Will also send to EP for alternative rate control.  So far has been stable off.   Meds and labs as above. Needs to see PCP. Refer to EP for alternate rate control strategies in setting of amio toxicity.   Follow up 4 weeks.   Mariam DollarMichael Andrew Eitan Doubleday [name] 06/24/2016 2:18 PM   Total  time spent > 25 minutes. Over half that spent discussing the above.

## 2016-06-24 NOTE — Patient Instructions (Addendum)
Take Torsemide 40 mg (two tabs) twice a day Take hydralazine 50 mg, one tab three times per day   Please be sure to follow up with Dr [name] ASAP  Your physician recommends that you schedule a follow-up appointment with EP- for  Alternative rate control  Your physician recommends that you schedule a follow-up appointment in: 4 weeks with Otilio Saber, PA  Do the following things EVERYDAY: 1) Weigh yourself in the morning before breakfast. Write it down and keep it in a log. 2) Take your medicines as prescribed 3) Eat low salt foods-Limit salt (sodium) to 2000 mg per day.  4) Stay as active as you can everyday 5) Limit all fluids for the day to less than 2 liters

## 2016-06-30 ENCOUNTER — Other Ambulatory Visit (HOSPITAL_COMMUNITY): Payer: Self-pay | Admitting: Internal Medicine

## 2016-07-02 ENCOUNTER — Encounter (HOSPITAL_COMMUNITY): Payer: Self-pay | Admitting: Nurse Practitioner

## 2016-07-02 ENCOUNTER — Ambulatory Visit (HOSPITAL_COMMUNITY)
Admission: RE | Admit: 2016-07-02 | Discharge: 2016-07-02 | Disposition: A | Discharge status: Home / Self Care | Payer: Medicare Other | Source: Ambulatory Visit | Attending: Nurse Practitioner | Admitting: Nurse Practitioner

## 2016-07-02 ENCOUNTER — Ambulatory Visit (HOSPITAL_COMMUNITY): Payer: Medicare Other | Admitting: Nurse Practitioner

## 2016-07-02 ENCOUNTER — Inpatient Hospital Stay (HOSPITAL_COMMUNITY): Admission: RE | Admit: 2016-07-02 | Payer: Medicare Other | Source: Ambulatory Visit | Admitting: Nurse Practitioner

## 2016-07-02 VITALS — BP 136/78 | HR 75 | Ht 65.5 in | Wt 282.4 lb

## 2016-07-02 DIAGNOSIS — M170 Bilateral primary osteoarthritis of knee: Secondary | ICD-10-CM | POA: Insufficient documentation

## 2016-07-02 DIAGNOSIS — I10 Essential (primary) hypertension: Secondary | ICD-10-CM | POA: Insufficient documentation

## 2016-07-02 DIAGNOSIS — E785 Hyperlipidemia, unspecified: Secondary | ICD-10-CM | POA: Diagnosis not present

## 2016-07-02 DIAGNOSIS — E0580 Other thyrotoxicosis without thyrotoxic crisis or storm: Secondary | ICD-10-CM | POA: Insufficient documentation

## 2016-07-02 DIAGNOSIS — J45909 Unspecified asthma, uncomplicated: Secondary | ICD-10-CM | POA: Insufficient documentation

## 2016-07-02 DIAGNOSIS — G4730 Sleep apnea, unspecified: Secondary | ICD-10-CM | POA: Diagnosis not present

## 2016-07-02 DIAGNOSIS — I429 Cardiomyopathy, unspecified: Secondary | ICD-10-CM | POA: Insufficient documentation

## 2016-07-02 DIAGNOSIS — N289 Disorder of kidney and ureter, unspecified: Secondary | ICD-10-CM | POA: Insufficient documentation

## 2016-07-02 DIAGNOSIS — M19042 Primary osteoarthritis, left hand: Secondary | ICD-10-CM | POA: Diagnosis not present

## 2016-07-02 DIAGNOSIS — T462X5A Adverse effect of other antidysrhythmic drugs, initial encounter: Secondary | ICD-10-CM | POA: Insufficient documentation

## 2016-07-02 DIAGNOSIS — Z95810 Presence of automatic (implantable) cardiac defibrillator: Secondary | ICD-10-CM | POA: Insufficient documentation

## 2016-07-02 DIAGNOSIS — G4733 Obstructive sleep apnea (adult) (pediatric): Secondary | ICD-10-CM | POA: Insufficient documentation

## 2016-07-02 DIAGNOSIS — M109 Gout, unspecified: Secondary | ICD-10-CM | POA: Insufficient documentation

## 2016-07-02 DIAGNOSIS — I480 Paroxysmal atrial fibrillation: Secondary | ICD-10-CM | POA: Diagnosis not present

## 2016-07-02 DIAGNOSIS — M19041 Primary osteoarthritis, right hand: Secondary | ICD-10-CM | POA: Diagnosis not present

## 2016-07-02 DIAGNOSIS — I5022 Chronic systolic (congestive) heart failure: Secondary | ICD-10-CM | POA: Diagnosis not present

## 2016-07-02 DIAGNOSIS — Z7901 Long term (current) use of anticoagulants: Secondary | ICD-10-CM | POA: Diagnosis not present

## 2016-07-02 DIAGNOSIS — K648 Other hemorrhoids: Secondary | ICD-10-CM | POA: Diagnosis not present

## 2016-07-02 DIAGNOSIS — I472 Ventricular tachycardia: Secondary | ICD-10-CM | POA: Diagnosis not present

## 2016-07-02 DIAGNOSIS — I272 Other secondary pulmonary hypertension: Secondary | ICD-10-CM | POA: Diagnosis not present

## 2016-07-02 DIAGNOSIS — Z794 Long term (current) use of insulin: Secondary | ICD-10-CM | POA: Diagnosis not present

## 2016-07-02 DIAGNOSIS — E0789 Other specified disorders of thyroid: Secondary | ICD-10-CM | POA: Diagnosis present

## 2016-07-02 DIAGNOSIS — E119 Type 2 diabetes mellitus without complications: Secondary | ICD-10-CM | POA: Insufficient documentation

## 2016-07-02 DIAGNOSIS — I4891 Unspecified atrial fibrillation: Secondary | ICD-10-CM | POA: Diagnosis not present

## 2016-07-02 NOTE — Progress Notes (Addendum)
Primary Care Physician: Gwen PoundsUSSO,JOHN M, MD Referring Physician: Otilio SaberAndy Tillery, PA EP: Dr. Mayme GentaAllred   Gary Hutchinson is a 55 y.o. male with a h/o afib  CKD III, HTN, morbid obesity,sleep apnea on CPAP, MDT  CRTD, chronic systolic heart failure, being seen in afib clinic for recent development of thyroid toxicity for amiodarone and drug was stopped 2 weeks ago. He is currently being treated for hyperthyroidism.  He feels no different off amiodarone and has not been aware of any irregular heart beat. Has been on amiodarone x 1-2 years. He is on xarelto.  Today, he denies symptoms of palpitations, chest pain, shortness of breath, orthopnea, PND, lower extremity edema, dizziness, presyncope, syncope, or neurologic sequela. The patient is tolerating medications without difficulties and is otherwise without complaint today.   Past Medical History:  Diagnosis Date  . AICD (automatic cardioverter/defibrillator) present   . Alcohol abuse    now quit  . Anemia    iron defi  . Arthritis    "fingers, knees, some days all my joints ache" (11/16/2015)  . Asthma   . Cholecystitis   . Chronic systolic congestive heart failure (HCC)    a. RHC (02/2011) RA 31/27, RV 71/27, PA67/45, PCWP 46, PA 49%, Fick CO: 3.4 b. ECHO (03/2014) EF 30-35%, grade II DD, mild MR, RV poorly visualized appears mildly decreased c. RHC (05/2014) RA 13, RV 54/4/11, PA 60/22 (37), PCWP 17, Fick CO/CI: 4.4 / 1.9, Thermo CO/CI: 3.8 /1.6, PVR 5.2 WU, PA 57% and 59%  . Diverticulosis of colon   . Gout   . Hemorrhoids, internal   . Hyperlipidemia   . Hypertension   . Morbid obesity (HCC)   . Nonischemic cardiomyopathy (HCC)    a. LHC (02/2011) normal coronaries  . OSA on CPAP   . Paroxysmal atrial fibrillation (HCC)   . Pneumonia 2000s X 1  . Pulmonary hypertension (HCC)   . Renal insufficiency   . Type II diabetes mellitus (HCC)   . Ventricular tachycardia [facility](HCC)    s/p MDT ICD implant   Past Surgical History:  Procedure  Laterality Date  . CARDIAC CATHETERIZATION  03/08/2004   EF 25-30%  . CARDIOVERSION N/A 10/17/2015   Procedure: CARDIOVERSION;  Surgeon: Vesta MixerPhilip J Nahser, MD;  [facility];  Service: Cardiovascular;  Laterality: N/A;  . EP IMPLANTABLE DEVICE N/A 11/16/2015   Procedure: BiVI Upgrade;  Surgeon: Hillis RangeJames Allred, MD;  [employer] [address]  . INSERTION OF ICD  06/2008   ICD- Medtronic   . RIGHT HEART CATHETERIZATION N/A 05/23/2014   Procedure: RIGHT HEART CATH;  Surgeon: Dolores Pattyaniel R [name], MD;  [facility];  Service: Cardiovascular;  Laterality: N/A;  . TRANSTHORACIC ECHOCARDIOGRAM  05/27/2008   EF 30-35%  . US ECHOCARDIOGRAPHY  08/22/2008   EF 30-35%    Current Outpatient Prescriptions  Medication Sig Dispense Refill  . acetaminophen (TYLENOL) 325 MG tablet Take 650 mg by mouth every 6 (six) hours as needed (pain). Reported on 10/11/2015    . albuterol (PROAIR HFA) 108 (90 BASE) MCG/ACT inhaler Inhale 2 puffs into the lungs every 6 (six) hours as needed for wheezing or shortness of breath. Reported on 02/06/2016    . allopurinol (ZYLOPRIM) 300 MG tablet Take 300 mg by mouth daily.    Marland Kitchen. atorvastatin (LIPITOR) 40 MG tablet Take 1 tablet (40 mg total) by mouth daily. 90 tablet 6  . bisoprolol (ZEBETA) 5 MG tablet Take 1 tablet (5 mg total) by  mouth daily. 30 tablet 5  . colchicine 0.6 MG tablet Take 0.6 mg by mouth daily as needed (Gout). Reported on 02/06/2016    . digoxin (LANOXIN) 0.125 MG tablet Take 0.5 tablets (0.0625 mg total) by mouth every other day. 45 tablet 3  . Fluticasone-Salmeterol (ADVAIR DISKUS) 250-50 MCG/DOSE AEPB Inhale 1 puff into the lungs every 12 (twelve) hours as needed (shortness of breath). Reported on 02/06/2016    . hydrALAZINE (APRESOLINE) 50 MG tablet Take 1 tablet (50 mg total) by mouth 3 (three) times daily. 90 tablet 6  . insulin detemir (LEVEMIR) 100 UNIT/ML injection Inject 36 Units into the skin at bedtime.     . isosorbide mononitrate (IMDUR) 30 MG 24  hr tablet TAKE 1 TABLET (30 MG TOTAL) BY MOUTH 2 (TWO) TIMES DAILY. 180 tablet 3  . linagliptin (TRADJENTA) 5 MG TABS tablet Take 1 tablet (5 mg total) by mouth daily. 30 tablet 3  . methimazole (TAPAZOLE) 10 MG tablet Take 1 tablet (10 mg total) by mouth 3 (three) times daily. 90 tablet 0  . metolazone (ZAROXOLYN) 2.5 MG tablet Take 2.5 mg by mouth daily as needed (edema). Reported on 02/06/2016    . montelukast (SINGULAIR) 10 MG tablet Take 10 mg by mouth daily as needed. For shortness of breath or wheezing    . potassium chloride SA (K-DUR,KLOR-CON) 20 MEQ tablet Take 20-40 mEq by mouth 2 (two) times daily. Take 40 meq (2 tablets) in the morning and 20 meq (1 tablet) in the afternoon    . rivaroxaban (XARELTO) 20 MG TABS tablet Take 1 tablet (20 mg total) by mouth daily with supper. 30 tablet 11  . spironolactone (ALDACTONE) 25 MG tablet Take 1/2 tablet by mouth once a day    . torsemide (DEMADEX) 20 MG tablet Take 2 tablets (40 mg total) by mouth 2 (two) times daily. 120 tablet 3   No current facility-administered medications for this encounter.     No Known Allergies  Social History   Social History  . Marital status: Single    Spouse name: N/A  . Number of children: 4  . Years of education: N/A   Occupational History  .  Disabled   Social History Main Topics  . Smoking status: Never Smoker  . Smokeless tobacco: Never Used  . Alcohol use Yes     Comment: former heavy ETOH, quit in "the late '90s"  . Drug use: No  . Sexual activity: Not Currently   Other Topics Concern  . Not on file   Social History Narrative   disabled    Family History  Problem Relation Age of Onset  . Cancer Father   . Diabetes      grandparents  . Hypertension Mother   . CAD      No family history    ROS- All systems are reviewed and negative except as per the HPI above  Physical Exam: Vitals:   07/02/16 0855  BP: 136/78  Pulse: 75  Weight: 282 lb 6.4 oz (128.1 kg)  Height: 5' 5.5"  (1.664 m)    GEN- The patient is well appearing, alert and oriented x 3 today.   Head- normocephalic, atraumatic Eyes-  Sclera clear, conjunctiva pink Ears- hearing intact Oropharynx- clear Neck- supple, no JVP Lymph- no cervical lymphadenopathy Lungs- Clear to ausculation bilaterally, normal work of breathing Heart- Regular rate and rhythm, no murmurs, rubs or gallops, PMI not laterally displaced GI- soft, NT, ND, + BS Extremities- no clubbing,  cyanosis, or edema MS- no significant deformity or atrophy Skin- no rash or lesion Psych- euthymic mood, full affect Neuro- strength and sensation are intact  EKG-atrial sensed ventricular paced at 75 bpm,  Pr int 168 ms, qrs int 495 Epic records reviewed   Assessment and Plan: 1. 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 Nehemiah Settle [facility] [address] [place], [place] [address] [phone]

## 2016-07-08 ENCOUNTER — Encounter: Payer: Self-pay | Admitting: Cardiology

## 2016-07-10 ENCOUNTER — Other Ambulatory Visit (HOSPITAL_COMMUNITY): Payer: Self-pay | Admitting: Cardiology

## 2016-07-15 ENCOUNTER — Ambulatory Visit (INDEPENDENT_AMBULATORY_CARE_PROVIDER_SITE_OTHER): Payer: Medicare Other

## 2016-07-15 DIAGNOSIS — I5022 Chronic systolic (congestive) heart failure: Secondary | ICD-10-CM

## 2016-07-15 DIAGNOSIS — Z95810 Presence of automatic (implantable) cardiac defibrillator: Secondary | ICD-10-CM

## 2016-07-15 NOTE — Progress Notes (Signed)
EPIC Encounter for ICM Monitoring  Patient Name: Gary Hutchinson is a 55 y.o. male Date: 07/15/2016 Primary Care Physican: [name], MD Primary Cardiologist: [name] Electrophysiologist: Allred Dry Weight:  unknown Bi-V Pacing:  96.1%      Clinical Status (10-Jun-2016 to 15-Jul-2016) Treated VT/VF 0 episodes  AT/AF 1 episode  Time in AT/AF 0.8 hr/day (3.2%)  No call to patient since patient has in office check with Dr [name] 07/16/2016.  Thoracic impedance abnormal suggesting fluid accumulation.  Follow-up plan: ICM clinic phone appointment on 08/18/2016.  HF clinic appointment 07/22/2016  Copy of ICM check sent to primary cardiologist and device physician.   ICM trend: 07/15/2016       Karie Soda, RN 07/15/2016 10:20 AM

## 2016-07-16 ENCOUNTER — Encounter (INDEPENDENT_AMBULATORY_CARE_PROVIDER_SITE_OTHER): Payer: Self-pay

## 2016-07-16 ENCOUNTER — Encounter: Payer: Self-pay | Admitting: Internal Medicine

## 2016-07-16 ENCOUNTER — Ambulatory Visit (INDEPENDENT_AMBULATORY_CARE_PROVIDER_SITE_OTHER): Payer: Medicare Other | Admitting: Internal Medicine

## 2016-07-16 VITALS — BP 122/70 | HR 74 | Ht 65.0 in | Wt 286.4 lb

## 2016-07-16 DIAGNOSIS — E0580 Other thyrotoxicosis without thyrotoxic crisis or storm: Secondary | ICD-10-CM

## 2016-07-16 DIAGNOSIS — Z95810 Presence of automatic (implantable) cardiac defibrillator: Secondary | ICD-10-CM

## 2016-07-16 DIAGNOSIS — I5022 Chronic systolic (congestive) heart failure: Secondary | ICD-10-CM

## 2016-07-16 DIAGNOSIS — I480 Paroxysmal atrial fibrillation: Secondary | ICD-10-CM

## 2016-07-16 DIAGNOSIS — G4733 Obstructive sleep apnea (adult) (pediatric): Secondary | ICD-10-CM

## 2016-07-16 DIAGNOSIS — T462X5A Adverse effect of other antidysrhythmic drugs, initial encounter: Secondary | ICD-10-CM

## 2016-07-16 NOTE — Progress Notes (Signed)
Electrophysiology Office Note   Date:  07/16/2016   ID:  Gary Hutchinson, DOB 11/18/1960, MRN [id number]  PCP:  [name], MD  Cardiologist:  [facility] Primary Electrophysiologist: Hillis Range, MD    Chief Complaint  Patient presents with  . Atrial Fibrillation     History of Present Illness: Gary Hutchinson is a 56 y.o. male who presents today for electrophysiology follow-up.   Exercise tolerance has improved with CRT upgrade.  Unfortunately, he has developed thryoid dysfunction with amiodarone.  Amiodarone has been discontinued.  He remains in sinus rhythm currently.  Dr [name] is following thryoid.  Today, he denies symptoms of palpitations, chest pain,  orthopnea, PND,  claudication, dizziness, presyncope, syncope, bleeding, or neurologic sequela. The patient is tolerating medications without difficulties and is otherwise without complaint today.    Past Medical History:  Diagnosis Date  . AICD (automatic cardioverter/defibrillator) present   . Alcohol abuse    now quit  . Anemia    iron defi  . Arthritis    "fingers, knees, some days all my joints ache" (11/16/2015)  . Asthma   . Cholecystitis   . Chronic systolic congestive heart failure (HCC)    a. RHC (02/2011) RA 31/27, RV 71/27, PA67/45, PCWP 46, PA 49%, Fick CO: 3.4 b. ECHO (03/2014) EF 30-35%, grade II DD, mild MR, RV poorly visualized appears mildly decreased c. RHC (05/2014) RA 13, RV 54/4/11, PA 60/22 (37), PCWP 17, Fick CO/CI: 4.4 / 1.9, Thermo CO/CI: 3.8 /1.6, PVR 5.2 WU, PA 57% and 59%  . Diverticulosis of colon   . Gout   . Hemorrhoids, internal   . Hyperlipidemia   . Hypertension   . Morbid obesity (HCC)   . Nonischemic cardiomyopathy (HCC)    a. LHC (02/2011) normal coronaries  . OSA on CPAP   . Paroxysmal atrial fibrillation (HCC)   . Pneumonia 2000s X 1  . Pulmonary hypertension   . Renal insufficiency   . Type II diabetes mellitus (HCC)   . Ventricular tachycardia [facility])    s/p MDT ICD  implant   Past Surgical History:  Procedure Laterality Date  . CARDIAC CATHETERIZATION  03/08/2004   EF 25-30%  . CARDIOVERSION N/A 10/17/2015   Procedure: CARDIOVERSION;  Surgeon: [name], MD;  Location: [facility] ENDOSCOPY;  Service: Cardiovascular;  Laterality: N/A;  . EP IMPLANTABLE DEVICE N/A 11/16/2015   Procedure: BiVI Upgrade;  Surgeon: Hillis Range, MD;  [employer] [address] [place]  . INSERTION OF ICD  06/2008   ICD- [employer]tronic   . RIGHT HEART CATHETERIZATION N/A 05/23/2014   Procedure: RIGHT HEART CATH;  Surgeon: [name], MD;  Location: [facility] CATH LAB;  Service: Cardiovascular;  Laterality: N/A;  . TRANSTHORACIC ECHOCARDIOGRAM  05/27/2008   EF 30-35%  . US ECHOCARDIOGRAPHY  08/22/2008   EF 30-35%     Current Outpatient Prescriptions  Medication Sig Dispense Refill  . acetaminophen (TYLENOL) 325 MG tablet Take 650 mg by mouth every 6 (six) hours as needed (pain). Reported on 10/11/2015    . albuterol (PROAIR HFA) 108 (90 BASE) MCG/ACT inhaler Inhale 2 puffs into the lungs every 6 (six) hours as needed for wheezing or shortness of breath. Reported on 02/06/2016    . allopurinol (ZYLOPRIM) 300 MG tablet Take 300 mg by mouth daily.    Marland Kitchen atorvastatin (LIPITOR) 40 MG tablet Take 1 tablet (40 mg total) by mouth daily. 90 tablet 6  . bisoprolol (ZEBETA) 5 MG tablet Take 1 tablet (  5 mg total) by mouth daily. 30 tablet 5  . colchicine 0.6 MG tablet Take 0.6 mg by mouth daily as needed (Gout). Reported on 02/06/2016    . digoxin (LANOXIN) 0.125 MG tablet Take 0.5 tablets (0.0625 mg total) by mouth every other day. 45 tablet 3  . Fluticasone-Salmeterol (ADVAIR DISKUS) 250-50 MCG/DOSE AEPB Inhale 1 puff into the lungs every 12 (twelve) hours as needed (shortness of breath). Reported on 02/06/2016    . hydrALAZINE (APRESOLINE) 50 MG tablet Take 1 tablet (50 mg total) by mouth 3 (three) times daily. 90 tablet 6  . insulin detemir (LEVEMIR) 100 UNIT/ML injection Inject 36 Units into the skin at  bedtime.     . isosorbide mononitrate (IMDUR) 30 MG 24 hr tablet TAKE 1 TABLET (30 MG TOTAL) BY MOUTH 2 (TWO) TIMES DAILY. 180 tablet 3  . linagliptin (TRADJENTA) 5 MG TABS tablet Take 1 tablet (5 mg total) by mouth daily. 30 tablet 3  . methimazole (TAPAZOLE) 10 MG tablet TAKE 1 TABLET BY MOUTH 3 TIMES A DAY 90 tablet 3  . metolazone (ZAROXOLYN) 2.5 MG tablet Take 2.5 mg by mouth daily as needed (edema). Reported on 02/06/2016    . montelukast (SINGULAIR) 10 MG tablet Take 10 mg by mouth daily as needed. For shortness of breath or wheezing    . potassium chloride SA (K-DUR,KLOR-CON) 20 MEQ tablet Take 20-40 mEq by mouth 2 (two) times daily. Take 40 meq (2 tablets) in the morning and 20 meq (1 tablet) in the afternoon    . rivaroxaban (XARELTO) 20 MG TABS tablet Take 1 tablet (20 mg total) by mouth daily with supper. 30 tablet 11  . spironolactone (ALDACTONE) 25 MG tablet Take 1/2 tablet by mouth once a day    . torsemide (DEMADEX) 20 MG tablet Take 2 tablets (40 mg total) by mouth 2 (two) times daily. 120 tablet 3   No current facility-administered medications for this visit.     Allergies:   Review of patient's allergies indicates no known allergies.   Social History:  The patient  reports that he has never smoked. He has never used smokeless tobacco. He reports that he drinks alcohol. He reports that he does not use drugs.   Family History:  The patient's family history includes Cancer in his father; Hypertension in his mother.    ROS:  Please see the history of present illness.   All other systems are reviewed and negative.    PHYSICAL EXAM: VS:  BP 122/70   Pulse 74   Ht 5\' 5"  (1.651 m)   Wt 286 lb 6.4 oz (129.9 kg)   BMI 47.66 kg/m  , BMI Body mass index is 47.66 kg/m. GEN: overweight in no acute distress  HEENT: normal  Neck: no JVD, no carotid bruits, or masses Cardiac: RRR; no murmurs, rubs, or gallops,no edema  Respiratory:  Decreased BS at the bases, normal work of  breathing GI: soft, nontender, nondistended, + BS MS: no deformity or atrophy  Skin: warm and dry, device pocket is well healed Neuro:  Strength and sensation are intact Psych: euthymic mood, full affect  EKG:  EKG 9/17 is reviewed and shows sinus with BiV pacing  Device interrogation is reviewed today in detail.  See PaceArt for details.   Recent Labs: 11/09/2015: Hemoglobin 13.5; Platelets 255 06/12/2016: ALT 25; Magnesium 2.1 06/13/2016: TSH 0.007 06/24/2016: B Natriuretic Peptide 338.1; BUN 27; Creatinine, Ser 1.16; Potassium 3.7; Sodium 137    Lipid Panel  Component Value Date/Time   CHOL 181 04/09/2011 0323   TRIG 137 04/09/2011 0323   HDL 37 (L) 04/09/2011 0323   CHOLHDL 4.9 04/09/2011 0323   VLDL 27 04/09/2011 0323   LDLCALC 117 (H) 04/09/2011 0323     Wt Readings from Last 3 Encounters:  07/16/16 286 lb 6.4 oz (129.9 kg)  07/02/16 282 lb 6.4 oz (128.1 kg)  06/24/16 298 lb 12.8 oz (135.5 kg)      Other studies Reviewed: Additional studies/ records that were reviewed today include: CHF clinic notes, AF clinic notes, recent echo (EF 25%)  ASSESSMENT AND PLAN:  1.  Nonischemic CM/ chronic systolic dysfunction 2 gram sodium restriction and daily weights encouraged On good medical therapy followed in ICM device clinic Importance of remote monitoring discussed  Normal BiV ICD function  See Pace Art report No changes today  2. Morbid obesity Weight loss is strongly advised.  He is making some progress (311 lbs a year ago!)  3. VT No recent arrhythmias  4. AFib On xarelto Off amiodarone Therapeutic strategies for afib including medicine and ablation were discussed in detail with the patient today.  We discussed recent [place] AF trial showing benefit with ablation. Given LA size of 6cm, I am not optimistic that he would maintain sinus off of AAD therapy.  We also discussed tikosyn.  At this point, he would like to continue without any changes.  IF he has more  afib then he will consider either tikosyn or ablation at that time.  5. OSA Compliant with BIPAP    Current medicines are reviewed at length with the patient today.   The patient does not have concerns regarding his medicines.  The following changes were made today:  None  Return to see me in 2 months [facility]  Signed, Hillis RangeJames Akaiya Touchette, MD  07/16/2016 9:39 AM     [facility] [address] [place] [address] [phone] (office) [phone] (fax)

## 2016-07-16 NOTE — Patient Instructions (Signed)
Medication Instructions:  Your physician recommends that you continue on your current medications as directed. Please refer to the Current Medication list given to you today.   Labwork: None ordered   Testing/Procedures: None ordered   Follow-Up: Your physician recommends that you schedule a follow-up appointment in: 2 months with Dr Allred   Any Other Special Instructions Will Be Listed Below (If Applicable).     If you need a refill on your cardiac medications before your next appointment, please call your pharmacy.   

## 2016-07-22 ENCOUNTER — Encounter (HOSPITAL_COMMUNITY): Payer: Medicare Other

## 2016-07-29 NOTE — Progress Notes (Signed)
Patient ID: Claudie FishermanJulius A Sporn, male   DOB: 1960-12-08, 55 y.o.   MRN: [id number]  [facility]  PCP: Creola CornJohn Russo EP: Hillis RangeJames Allred  HPI: [name] is a 55 year old male with a history of chronic systolic heart failure, ICM s/p ICD, HTN, DM II, OSA on CPAP, history of ventricular tachycardia status post AICD placement in 2009, atrial fibrillation and morbid obesity.    Admitted 6/1-03/13/14 for A/C HF and cardiogenic shock (co-ox 42%). Diuresed with IV lasix and milrinone which were weaned off.  He went into Afib with RVR and chemically cardioverted back to NSR with IV amiodarone. Placed on Xarelto. BB restarted 1/2 dose and held ACE-I. His blood sugars were elevated along with Hgb A1C and started on insulin. Discharge weight 344 lbs.   RHC (05/2014): Left sided filling pressures well compensated, mild pulm HTN with elevated right-sided pressures and mod/severely depressed CO. Discussion about trial of milrinone but patient wanted to hold off.  Admitted January 2017 with volume overload and cellulitis. Diuresed with IV lasix and required short term milrinone. Loaded on amio and had successful DC-CV on  10/17/2015.  Discharge weight was 307 pounds.   He presents today for regular follow up. Overall feeling good. Weight at home 288 pounds. Denies SOB/PND/Orthonpnea. Taking metolaozne 1-2 times a month.. Taking all medications. Taking all medications. No BRBPR.    Optivol - Fluid status stable. Fluid well below the threshold. No further AT/AF alerts since last visit. A    05/03/12: EF 25-30%.  Grade 1 diastolic dysfunction.  Mild MR.  Mod dilated LA.   07/27/13: EF 40%, diff HK, MR mild, LA mild/mod dilated  03/2014: EF30-35%, grade II DD, mild MR, RV systolic fx mildly decreased 10/2014: EF 40%.  10/2015: EF ~40%. RV mildly dilated.    Labs  03/08/13 K 3.8 Creatinine 1.02  07/27/13 K 4.7 Creatinine 1.61 Dig level 0.9 ---> dig cut back to 0.125 mg every other day 10/10/13 K 3.7 Creatinine  1.4  03/17/14: K 3.8, Creatinine 1.8, BUN 29 (PCP office) 05/10/14: K 4.0 Cr 2.4 11/17/15: K 3.2, Creatinine 2.04 05/18/14: K 4.1, Cr 1.8, BUN 34, dig level 0.7,  06/06/14: K 4.2 Creatinine 1.34  08/21/2014: K 4.1 Creatinine 1.46 Dig level 0.7 10/19/2015: K 3.6 Creatinine 2.15  11/21/15: K 3.9, Creatinine 1.38  SH: [place]. Disabled. No ETOH or tobacco abuse  FH; Mother living: HT        Father deceased: was not part of his life so not sure health issues  ROS: All systems negative except as listed in HPI, PMH and Problem List.  Past Medical History:  Diagnosis Date  . AICD (automatic cardioverter/defibrillator) present   . Alcohol abuse    now quit  . Anemia    iron defi  . Arthritis    "fingers, knees, some days all my joints ache" (11/16/2015)  . Asthma   . Cholecystitis   . Chronic systolic congestive heart failure (HCC)    a. RHC (02/2011) RA 31/27, RV 71/27, PA67/45, PCWP 46, PA 49%, Fick CO: 3.4 b. ECHO (03/2014) EF 30-35%, grade II DD, mild MR, RV poorly visualized appears mildly decreased c. RHC (05/2014) RA 13, RV 54/4/11, PA 60/22 (37), PCWP 17, Fick CO/CI: 4.4 / 1.9, Thermo CO/CI: 3.8 /1.6, PVR 5.2 WU, PA 57% and 59%  . Diverticulosis of colon   . Gout   . Hemorrhoids, internal   . Hyperlipidemia   . Hypertension   . Morbid  obesity (HCC)   . Nonischemic cardiomyopathy (HCC)    a. LHC (02/2011) normal coronaries  . OSA on CPAP   . Paroxysmal atrial fibrillation (HCC)   . Pneumonia 2000s X 1  . Pulmonary hypertension   . Renal insufficiency   . Type II diabetes mellitus (HCC)   . Ventricular tachycardia [facility])    s/p MDT ICD implant    Current Outpatient Prescriptions  Medication Sig Dispense Refill  . acetaminophen (TYLENOL) 325 MG tablet Take 650 mg by mouth every 6 (six) hours as needed (pain). Reported on 10/11/2015    . albuterol (PROAIR HFA) 108 (90 BASE) MCG/ACT inhaler Inhale 2 puffs into the lungs every 6 (six) hours as needed for wheezing or  shortness of breath. Reported on 02/06/2016    . allopurinol (ZYLOPRIM) 300 MG tablet Take 300 mg by mouth daily.    Marland Kitchen atorvastatin (LIPITOR) 40 MG tablet Take 1 tablet (40 mg total) by mouth daily. 90 tablet 6  . bisoprolol (ZEBETA) 5 MG tablet Take 1 tablet (5 mg total) by mouth daily. 30 tablet 5  . digoxin (LANOXIN) 0.125 MG tablet Take 0.5 tablets (0.0625 mg total) by mouth every other day. 45 tablet 3  . Fluticasone-Salmeterol (ADVAIR DISKUS) 250-50 MCG/DOSE AEPB Inhale 1 puff into the lungs every 12 (twelve) hours as needed (shortness of breath). Reported on 02/06/2016    . hydrALAZINE (APRESOLINE) 50 MG tablet Take 1 tablet (50 mg total) by mouth 3 (three) times daily. 90 tablet 6  . insulin detemir (LEVEMIR) 100 UNIT/ML injection Inject 36 Units into the skin at bedtime.     . isosorbide mononitrate (IMDUR) 30 MG 24 hr tablet TAKE 1 TABLET (30 MG TOTAL) BY MOUTH 2 (TWO) TIMES DAILY. 180 tablet 3  . linagliptin (TRADJENTA) 5 MG TABS tablet Take 1 tablet (5 mg total) by mouth daily. 30 tablet 3  . methimazole (TAPAZOLE) 10 MG tablet Take 10-20 mg by mouth 3 (three) times daily. Take 20 mg (2 tablets) in the morning, 10 mg (1 tab) at noon and 10 mg (1 tab) in the evening    . metolazone (ZAROXOLYN) 2.5 MG tablet Take 2.5 mg by mouth daily as needed (edema). Reported on 02/06/2016    . montelukast (SINGULAIR) 10 MG tablet Take 10 mg by mouth daily as needed. For shortness of breath or wheezing    . potassium chloride SA (K-DUR,KLOR-CON) 20 MEQ tablet Take 20-40 mEq by mouth 2 (two) times daily. Take 40 meq (2 tablets) in the morning and 20 meq (1 tablet) in the afternoon    . rivaroxaban (XARELTO) 20 MG TABS tablet Take 1 tablet (20 mg total) by mouth daily with supper. 30 tablet 11  . spironolactone (ALDACTONE) 25 MG tablet Take 1/2 tablet by mouth once a day    . torsemide (DEMADEX) 20 MG tablet Take 2 tablets (40 mg total) by mouth 2 (two) times daily. 120 tablet 3  . colchicine 0.6 MG tablet Take  0.6 mg by mouth daily as needed (Gout). Reported on 02/06/2016     No current facility-administered medications for this encounter.     Vitals:   07/30/16 0912  BP: 132/66  BP Location: Right Arm  Patient Position: Sitting  Cuff Size: Large  Pulse: 81  SpO2: 96%  Weight: 288 lb 3.2 oz (130.7 kg)   Wt Readings from Last 3 Encounters:  07/30/16 288 lb 3.2 oz (130.7 kg)  07/16/16 286 lb 6.4 oz (129.9 kg)  07/02/16 282 lb  6.4 oz (128.1 kg)     PHYSICAL EXAM: General:  Well appearing. No resp difficulty, NAD. Ambulated in the clinic without difficulty.  HEENT: normal Neck: Thick. JVP difficult to assess d/t body but does not appear elevated. Carotids 2+ bilaterally; no bruits. No thyromegaly or nodule noted.  Cor: PMI nonpalpable. RRR. No rubs or gallops. 2/6 TR murmur. 2/6 MR Lungs: Clear, normal effort Abdomen: obese, soft, NT, ND, no HSM. No bruits or masses. +BS  Extremities: no cyanosis, clubbing, rash, edema. Neuro: alert & orientedx3, cranial nerves grossly intact. Moves all 4 extremities w/o difficulty. Affect pleasant.   ASSESSMENT & PLAN:   1) Chronic systolic HF: NICM s/p BiV [employer], EF 25% 9//2017.   - NYHA II symptoms . Volume status ok. Continue torsemide 40 mg in am and decrease pm dose to 20 mg with addition of entresto.  Add 24-26 mg entresto twice a day. Recent creatinine 1.16. Repeat BMET in 7 days.  - Continue current potassium dose.  - Continue bisoprolol 5 mg daily.  - Continue digoxin 0.0625 mg every other day. - Change hydralazine to 50 mg TID. Continue Imdur 30 mg BID Reinforced the need and importance of daily weights, a low sodium diet, and fluid restriction (less than 2 L a day). Instructed to call the HF clinic if weight increases more than 3 lbs overnight or 5 lbs in a week.  2) CKD stage III -  Followed by nephrology. Recent creatinine 1.16. Repeat BMET next week.   3) HTN - Continue current regimen.  4) OSA - Continue to wear CPAP nightly 5)  PAF-  S/P Succesful DC-CV 10/17/2015  (Initial episode 03/2014) Recurrent A fib 10/2015 with successful DC/CV on 10/17/15.   Optivol interrogation today. No A fib - Continue Xarelto 20. He states he has not missed any doses.  - LFTs stable 9/17.  - Needs yearly eye exam and he is aware.  - Now off amiodarone with thyrotoxicity. Evaluated by EP and for now will follow. If he has further AF will need to consider tikosyn or ablation.   6) Obesity:   - Continued to encouraged to lose weight and increase activity as able.  7) Amiodarone thyroxicity  - Off Amiodarone. Continue Methimazole 10 mg TID for now.     Follow up  2 weeks to optimize HF meds.   Zhoey Blackstock NP-C  07/30/2016 9:33 AM

## 2016-07-30 ENCOUNTER — Ambulatory Visit (HOSPITAL_COMMUNITY)
Admission: RE | Admit: 2016-07-30 | Discharge: 2016-07-30 | Disposition: A | Discharge status: Home / Self Care | Payer: Medicare Other | Source: Ambulatory Visit | Attending: Cardiology | Admitting: Cardiology

## 2016-07-30 ENCOUNTER — Encounter: Payer: Self-pay | Admitting: Nurse Practitioner

## 2016-07-30 VITALS — BP 132/66 | HR 81 | Wt 288.2 lb

## 2016-07-30 DIAGNOSIS — I5022 Chronic systolic (congestive) heart failure: Secondary | ICD-10-CM | POA: Insufficient documentation

## 2016-07-30 DIAGNOSIS — E0580 Other thyrotoxicosis without thyrotoxic crisis or storm: Secondary | ICD-10-CM

## 2016-07-30 DIAGNOSIS — N183 Chronic kidney disease, stage 3 (moderate): Secondary | ICD-10-CM | POA: Insufficient documentation

## 2016-07-30 DIAGNOSIS — Z7901 Long term (current) use of anticoagulants: Secondary | ICD-10-CM | POA: Diagnosis not present

## 2016-07-30 DIAGNOSIS — I509 Heart failure, unspecified: Secondary | ICD-10-CM

## 2016-07-30 DIAGNOSIS — K648 Other hemorrhoids: Secondary | ICD-10-CM | POA: Insufficient documentation

## 2016-07-30 DIAGNOSIS — G4733 Obstructive sleep apnea (adult) (pediatric): Secondary | ICD-10-CM | POA: Diagnosis not present

## 2016-07-30 DIAGNOSIS — E1122 Type 2 diabetes mellitus with diabetic chronic kidney disease: Secondary | ICD-10-CM | POA: Diagnosis not present

## 2016-07-30 DIAGNOSIS — I472 Ventricular tachycardia: Secondary | ICD-10-CM | POA: Diagnosis not present

## 2016-07-30 DIAGNOSIS — T462X5A Adverse effect of other antidysrhythmic drugs, initial encounter: Secondary | ICD-10-CM

## 2016-07-30 DIAGNOSIS — I272 Pulmonary hypertension, unspecified: Secondary | ICD-10-CM | POA: Diagnosis not present

## 2016-07-30 DIAGNOSIS — Z79899 Other long term (current) drug therapy: Secondary | ICD-10-CM | POA: Insufficient documentation

## 2016-07-30 DIAGNOSIS — Z794 Long term (current) use of insulin: Secondary | ICD-10-CM | POA: Diagnosis not present

## 2016-07-30 DIAGNOSIS — E785 Hyperlipidemia, unspecified: Secondary | ICD-10-CM | POA: Diagnosis not present

## 2016-07-30 DIAGNOSIS — J45909 Unspecified asthma, uncomplicated: Secondary | ICD-10-CM | POA: Insufficient documentation

## 2016-07-30 DIAGNOSIS — Z95810 Presence of automatic (implantable) cardiac defibrillator: Secondary | ICD-10-CM | POA: Insufficient documentation

## 2016-07-30 DIAGNOSIS — I429 Cardiomyopathy, unspecified: Secondary | ICD-10-CM | POA: Insufficient documentation

## 2016-07-30 DIAGNOSIS — I130 Hypertensive heart and chronic kidney disease with heart failure and stage 1 through stage 4 chronic kidney disease, or unspecified chronic kidney disease: Secondary | ICD-10-CM | POA: Insufficient documentation

## 2016-07-30 DIAGNOSIS — M1990 Unspecified osteoarthritis, unspecified site: Secondary | ICD-10-CM | POA: Insufficient documentation

## 2016-07-30 DIAGNOSIS — I480 Paroxysmal atrial fibrillation: Secondary | ICD-10-CM | POA: Diagnosis not present

## 2016-07-30 DIAGNOSIS — M109 Gout, unspecified: Secondary | ICD-10-CM | POA: Diagnosis not present

## 2016-07-30 MED ORDER — TORSEMIDE 20 MG PO TABS: ORAL_TABLET | ORAL | 3 refills | Status: DC

## 2016-07-30 MED ORDER — SACUBITRIL-VALSARTAN 24-26 MG PO TABS: 1.0000 | ORAL_TABLET | Freq: Two times a day (BID) | ORAL | 3 refills | Status: DC

## 2016-07-30 NOTE — Progress Notes (Signed)
Advanced Heart Failure Medication Review by a Pharmacist  Does the patient  feel that his/her medications are working for him/her?  yes  Has the patient been experiencing any side effects to the medications prescribed?  no  Does the patient measure his/her own blood pressure or blood glucose at home?  yes   Does the patient have any problems obtaining medications due to transportation or finances?   no  Understanding of regimen: good Understanding of indications: good Potential of compliance: good Patient understands to avoid NSAIDs. Patient understands to avoid decongestants.  Issues to address at subsequent visits: None   Pharmacist comments:  [name] is a pleasant 55 yo M presenting without a medication list but with good recall of his regimen. He reports good compliance with his regimen stating that he has not missed any doses in the last few weeks and has only needed metolazone once in that time. He did not have any specific medication-related questions or concerns for me at this time.   Gary Hutchinson. Gary Hutchinson, PharmD, BCPS, CPP Clinical Pharmacist Pager: [phone] Phone: [phone] 07/30/2016 9:32 AM      Time with patient: 10 minutes Preparation and documentation time: 2 minutes Total time: 12 minutes

## 2016-07-30 NOTE — Patient Instructions (Signed)
REDUCE afternoon torsemide. Take Torsemide 40 mg (2 tabs) in am and 20 mg (1 tab) in pm.  START Entresto 24/26 mg (1 tab) twice daily.  Return for labs in 1 week.  Follow up 4 weeks with Amy Clegg NP-C.  Do the following things EVERYDAY: 1) Weigh yourself in the morning before breakfast. Write it down and keep it in a log. 2) Take your medicines as prescribed 3) Eat low salt foods-Limit salt (sodium) to 2000 mg per day.  4) Stay as active as you can everyday 5) Limit all fluids for the day to less than 2 liters

## 2016-08-06 ENCOUNTER — Ambulatory Visit (HOSPITAL_COMMUNITY)
Admission: RE | Admit: 2016-08-06 | Discharge: 2016-08-06 | Disposition: A | Discharge status: Home / Self Care | Payer: Medicare Other | Source: Ambulatory Visit | Attending: Cardiology | Admitting: Cardiology

## 2016-08-06 ENCOUNTER — Other Ambulatory Visit (HOSPITAL_COMMUNITY): Payer: Self-pay | Admitting: *Deleted

## 2016-08-06 DIAGNOSIS — I509 Heart failure, unspecified: Secondary | ICD-10-CM | POA: Diagnosis not present

## 2016-08-06 LAB — BASIC METABOLIC PANEL
ANION GAP: 6 (ref 5–15)
BUN: 21 mg/dL — AB (ref 6–20)
CHLORIDE: 96 mmol/L — AB (ref 101–111)
CO2: 29 mmol/L (ref 22–32)
Calcium: 9.2 mg/dL (ref 8.9–10.3)
Creatinine, Ser: 1.41 mg/dL — ABNORMAL HIGH (ref 0.61–1.24)
GFR, EST NON AFRICAN AMERICAN: 55 mL/min — AB (ref >60)
Glucose, Bld: 515 mg/dL (ref 65–99)
POTASSIUM: 4.7 mmol/L (ref 3.5–5.1)
SODIUM: 131 mmol/L — AB (ref 135–145)

## 2016-08-12 ENCOUNTER — Other Ambulatory Visit (HOSPITAL_COMMUNITY): Payer: Self-pay | Admitting: Internal Medicine

## 2016-08-15 LAB — CUP PACEART INCLINIC DEVICE CHECK
Battery Remaining Longevity: 107 mo
Brady Statistic AP VS Percent: 0.27 %
Brady Statistic AS VS Percent: 6.3 %
HighPow Impedance: 84 Ohm
Implantable Lead Location: 753859
Implantable Lead Location: 753860
Implantable Lead Model: 5076
Implantable Pulse Generator Implant Date: 20170210
Lead Channel Impedance Value: 1026 Ohm
Lead Channel Impedance Value: 1064 Ohm
Lead Channel Impedance Value: 456 Ohm
Lead Channel Impedance Value: 513 Ohm
Lead Channel Impedance Value: 513 Ohm
Lead Channel Impedance Value: 988 Ohm
Lead Channel Pacing Threshold Amplitude: 1 V
Lead Channel Pacing Threshold Amplitude: 1 V
Lead Channel Pacing Threshold Pulse Width: 0.4 ms
Lead Channel Pacing Threshold Pulse Width: 0.4 ms
Lead Channel Sensing Intrinsic Amplitude: 3.625 mV
Lead Channel Sensing Intrinsic Amplitude: 8.125 mV
Lead Channel Setting Pacing Amplitude: 2 V
Lead Channel Setting Sensing Sensitivity: 0.3 mV
MDC IDC LEAD IMPLANT DT: 20090901
MDC IDC LEAD IMPLANT DT: 20170210
MDC IDC LEAD IMPLANT DT: 20170210
MDC IDC LEAD LOCATION: 753858
MDC IDC LEAD MODEL: 4598
MDC IDC LEAD MODEL: 6935
MDC IDC MSMT BATTERY VOLTAGE: 3.01 V
MDC IDC MSMT LEADCHNL LV IMPEDANCE VALUE: 418 Ohm
MDC IDC MSMT LEADCHNL LV IMPEDANCE VALUE: 475 Ohm
MDC IDC MSMT LEADCHNL LV IMPEDANCE VALUE: 665 Ohm
MDC IDC MSMT LEADCHNL LV IMPEDANCE VALUE: 722 Ohm
MDC IDC MSMT LEADCHNL LV IMPEDANCE VALUE: 817 Ohm
MDC IDC MSMT LEADCHNL RV IMPEDANCE VALUE: 304 Ohm
MDC IDC MSMT LEADCHNL RV IMPEDANCE VALUE: 361 Ohm
MDC IDC MSMT LEADCHNL RV PACING THRESHOLD AMPLITUDE: 1 V
MDC IDC MSMT LEADCHNL RV PACING THRESHOLD PULSEWIDTH: 0.4 ms
MDC IDC SESS DTM: 20171011133215
MDC IDC SET LEADCHNL LV PACING AMPLITUDE: 2 V
MDC IDC SET LEADCHNL LV PACING PULSEWIDTH: 0.4 ms
MDC IDC SET LEADCHNL RA PACING AMPLITUDE: 1.75 V
MDC IDC SET LEADCHNL RV PACING PULSEWIDTH: 0.4 ms
MDC IDC STAT BRADY AP VP PERCENT: 5.76 %
MDC IDC STAT BRADY AS VP PERCENT: 87.68 %
MDC IDC STAT BRADY RA PERCENT PACED: 5.99 %
MDC IDC STAT BRADY RV PERCENT PACED: 18.79 %

## 2016-08-18 ENCOUNTER — Ambulatory Visit (INDEPENDENT_AMBULATORY_CARE_PROVIDER_SITE_OTHER): Payer: Medicare Other

## 2016-08-18 DIAGNOSIS — Z95810 Presence of automatic (implantable) cardiac defibrillator: Secondary | ICD-10-CM | POA: Diagnosis not present

## 2016-08-18 DIAGNOSIS — I509 Heart failure, unspecified: Secondary | ICD-10-CM | POA: Diagnosis not present

## 2016-08-18 NOTE — Progress Notes (Signed)
EPIC Encounter for ICM Monitoring  Patient Name: Gary Hutchinson is a 55 y.o. male Date: 08/18/2016 Primary Care Physican: [name], MD Primary Cardiologist:[name] Electrophysiologist: Allred Dry Weight:    281 lbs       Heart Failure questions reviewed, pt asymptomatic   Thoracic impedance returned to normal today.  Was abnormal suggesting fluid accumulation for the last couple of weeks until today.  Recommendations: No changes.  Advised to limit salt intake to 2000 mg daily.  Encouraged to call for fluid symptoms.    Follow-up plan: ICM clinic phone appointment on 09/18/2016.  Office appointment with HF clinic 08/26/2016 and Dr [name] 10/01/2016.  Copy of ICM check sent to primary cardiologist and device physician.   ICM trend: 08/18/2016       Karie Soda, RN 08/18/2016 1:36 PM

## 2016-08-26 ENCOUNTER — Encounter (HOSPITAL_COMMUNITY): Payer: Medicare Other

## 2016-08-26 ENCOUNTER — Other Ambulatory Visit (HOSPITAL_COMMUNITY): Payer: Self-pay | Admitting: *Deleted

## 2016-08-26 DIAGNOSIS — I5022 Chronic systolic (congestive) heart failure: Secondary | ICD-10-CM

## 2016-08-26 MED ORDER — HYDRALAZINE HCL 50 MG PO TABS: 50.0000 mg | ORAL_TABLET | Freq: Three times a day (TID) | ORAL | 6 refills | Status: DC

## 2016-08-26 MED ORDER — TORSEMIDE 20 MG PO TABS: ORAL_TABLET | ORAL | 3 refills | Status: DC

## 2016-08-27 ENCOUNTER — Telehealth (HOSPITAL_COMMUNITY): Payer: Self-pay | Admitting: Pharmacist

## 2016-08-27 NOTE — Telephone Encounter (Signed)
Mr. Rutt had stated that his Sherryll Burger was very expensive for him. He is enrolled in [employer] through 02/2017 and still has $1500 grant. I called [facility] and they stated his copay is $8 so he is not eligible to use the grant unless his copay is > $25.   Shaquel Josephson K. Bonnye Fava, PharmD, BCPS, CPP Clinical Pharmacist Pager: [phone] Phone: [phone] 08/27/2016 1:27 PM

## 2016-09-03 ENCOUNTER — Encounter (HOSPITAL_COMMUNITY): Payer: Self-pay

## 2016-09-03 ENCOUNTER — Telehealth (HOSPITAL_COMMUNITY): Payer: Self-pay | Admitting: Cardiology

## 2016-09-03 ENCOUNTER — Ambulatory Visit (HOSPITAL_COMMUNITY)
Admission: RE | Admit: 2016-09-03 | Discharge: 2016-09-03 | Disposition: A | Discharge status: Home / Self Care | Payer: Medicare Other | Source: Ambulatory Visit | Attending: Cardiology | Admitting: Cardiology

## 2016-09-03 VITALS — BP 124/62 | HR 68 | Wt 285.2 lb

## 2016-09-03 DIAGNOSIS — E669 Obesity, unspecified: Secondary | ICD-10-CM | POA: Insufficient documentation

## 2016-09-03 DIAGNOSIS — K648 Other hemorrhoids: Secondary | ICD-10-CM | POA: Diagnosis not present

## 2016-09-03 DIAGNOSIS — I10 Essential (primary) hypertension: Secondary | ICD-10-CM | POA: Diagnosis not present

## 2016-09-03 DIAGNOSIS — Z7901 Long term (current) use of anticoagulants: Secondary | ICD-10-CM | POA: Diagnosis not present

## 2016-09-03 DIAGNOSIS — N183 Chronic kidney disease, stage 3 (moderate): Secondary | ICD-10-CM | POA: Insufficient documentation

## 2016-09-03 DIAGNOSIS — Z794 Long term (current) use of insulin: Secondary | ICD-10-CM | POA: Insufficient documentation

## 2016-09-03 DIAGNOSIS — E785 Hyperlipidemia, unspecified: Secondary | ICD-10-CM | POA: Diagnosis not present

## 2016-09-03 DIAGNOSIS — G4733 Obstructive sleep apnea (adult) (pediatric): Secondary | ICD-10-CM | POA: Diagnosis not present

## 2016-09-03 DIAGNOSIS — E1122 Type 2 diabetes mellitus with diabetic chronic kidney disease: Secondary | ICD-10-CM | POA: Diagnosis not present

## 2016-09-03 DIAGNOSIS — Z79899 Other long term (current) drug therapy: Secondary | ICD-10-CM | POA: Diagnosis not present

## 2016-09-03 DIAGNOSIS — I130 Hypertensive heart and chronic kidney disease with heart failure and stage 1 through stage 4 chronic kidney disease, or unspecified chronic kidney disease: Secondary | ICD-10-CM | POA: Insufficient documentation

## 2016-09-03 DIAGNOSIS — Z95810 Presence of automatic (implantable) cardiac defibrillator: Secondary | ICD-10-CM | POA: Insufficient documentation

## 2016-09-03 DIAGNOSIS — I509 Heart failure, unspecified: Secondary | ICD-10-CM

## 2016-09-03 DIAGNOSIS — I272 Pulmonary hypertension, unspecified: Secondary | ICD-10-CM | POA: Diagnosis not present

## 2016-09-03 DIAGNOSIS — J45909 Unspecified asthma, uncomplicated: Secondary | ICD-10-CM | POA: Insufficient documentation

## 2016-09-03 DIAGNOSIS — I480 Paroxysmal atrial fibrillation: Secondary | ICD-10-CM

## 2016-09-03 DIAGNOSIS — I429 Cardiomyopathy, unspecified: Secondary | ICD-10-CM | POA: Insufficient documentation

## 2016-09-03 DIAGNOSIS — E0580 Other thyrotoxicosis without thyrotoxic crisis or storm: Secondary | ICD-10-CM

## 2016-09-03 DIAGNOSIS — I472 Ventricular tachycardia: Secondary | ICD-10-CM | POA: Diagnosis not present

## 2016-09-03 DIAGNOSIS — M109 Gout, unspecified: Secondary | ICD-10-CM | POA: Insufficient documentation

## 2016-09-03 DIAGNOSIS — I5022 Chronic systolic (congestive) heart failure: Secondary | ICD-10-CM | POA: Diagnosis present

## 2016-09-03 DIAGNOSIS — T462X5A Adverse effect of other antidysrhythmic drugs, initial encounter: Secondary | ICD-10-CM

## 2016-09-03 LAB — BASIC METABOLIC PANEL
Anion gap: 6 (ref 5–15)
BUN: 34 mg/dL — AB (ref 6–20)
CALCIUM: 9.4 mg/dL (ref 8.9–10.3)
CO2: 25 mmol/L (ref 22–32)
CREATININE: 1.39 mg/dL — AB (ref 0.61–1.24)
Chloride: 105 mmol/L (ref 101–111)
GFR calc non Af Amer: 56 mL/min — ABNORMAL LOW (ref >60)
GLUCOSE: 155 mg/dL — AB (ref 65–99)
Potassium: 4.8 mmol/L (ref 3.5–5.1)
Sodium: 136 mmol/L (ref 135–145)

## 2016-09-03 LAB — BRAIN NATRIURETIC PEPTIDE: B Natriuretic Peptide: 103.7 pg/mL — ABNORMAL HIGH (ref 0.0–100.0)

## 2016-09-03 MED ORDER — POTASSIUM CHLORIDE CRYS ER 20 MEQ PO TBCR: 40.0000 meq | EXTENDED_RELEASE_TABLET | Freq: Every day | ORAL | Status: DC

## 2016-09-03 MED ORDER — SACUBITRIL-VALSARTAN 49-51 MG PO TABS: 1.0000 | ORAL_TABLET | Freq: Two times a day (BID) | ORAL | 3 refills | Status: DC

## 2016-09-03 NOTE — Progress Notes (Signed)
Patient ID: Gary Hutchinson, male   DOB: 1961/08/11, 55 y.o.   MRN: [id number]  [facility]  PCP: Creola CornJohn Russo EP: Hillis RangeJames Allred  HPI: Gary Hutchinson is a 55 year old male with a history of chronic systolic heart failure, ICM s/p ICD, HTN, DM II, OSA on CPAP, history of ventricular tachycardia status post AICD placement in 2009, atrial fibrillation and morbid obesity.    Admitted 6/1-03/13/14 for A/C HF and cardiogenic shock (co-ox 42%). Diuresed with IV lasix and milrinone which were weaned off.  He went into Afib with RVR and chemically cardioverted back to NSR with IV amiodarone. Placed on Xarelto. BB restarted 1/2 dose and held ACE-I. His blood sugars were elevated along with Hgb A1C and started on insulin. Discharge weight 344 lbs.   RHC (05/2014): Left sided filling pressures well compensated, mild pulm HTN with elevated right-sided pressures and mod/severely depressed CO. Discussion about trial of milrinone but patient wanted to hold off.  Admitted January 2017 with volume overload and cellulitis. Diuresed with IV lasix and required short term milrinone. Loaded on amio and had successful DC-CV on  10/17/2015.  Discharge weight was 307 pounds.   He presents today for regular follow up. At last visit BMET showed glucose of 515, was urged to go to ED, but pt did not.   Has seen PCP since then.  Sugar was 134 this am. Staying under 200 even post-prandial.   Breathing has been great. Watched his meal over thanksgiving. Didn't need any extra fluid pill.  Takes metolazone 1-2 times a month. Taking all medications as directed. Thinks he is drinking > 2L.  Weight at home 282. No BRPBR or Melena. Denies lightheadedness or dizziness.   Optivol - Fluid index below threshold.  Thoracic impedence OK.  No further Afib. Pt activity between 1-2 hrs daily.    05/03/12: EF 25-30%.  Grade 1 diastolic dysfunction.  Mild MR.  Mod dilated LA.   07/27/13: EF 40%, diff HK, MR mild, LA mild/mod dilated  03/2014: EF30-35%, grade II DD, mild MR, RV systolic fx mildly decreased 10/2014: EF 40%.  10/2015: EF ~40%. RV mildly dilated.    Labs  03/08/13 K 3.8 Creatinine 1.02  07/27/13 K 4.7 Creatinine 1.61 Dig level 0.9 ---> dig cut back to 0.125 mg every other day 10/10/13 K 3.7 Creatinine 1.4  03/17/14: K 3.8, Creatinine 1.8, BUN 29 (PCP office) 05/10/14: K 4.0 Cr 2.4 11/17/15: K 3.2, Creatinine 2.04 05/18/14: K 4.1, Cr 1.8, BUN 34, dig level 0.7,  06/06/14: K 4.2 Creatinine 1.34  08/21/2014: K 4.1 Creatinine 1.46 Dig level 0.7 10/19/2015: K 3.6 Creatinine 2.15  11/21/15: K 3.9, Creatinine 1.38  SH: [place]. Disabled. No ETOH or tobacco abuse  FH; Mother living: HT        Father deceased: was not part of his life so not sure health issues  ROS: All systems negative except as listed in HPI, PMH and Problem List.  Past Medical History:  Diagnosis Date  . AICD (automatic cardioverter/defibrillator) present   . Alcohol abuse    now quit  . Anemia    iron defi  . Arthritis    "fingers, knees, some days all my joints ache" (11/16/2015)  . Asthma   . Cholecystitis   . Chronic systolic congestive heart failure (HCC)    a. RHC (02/2011) RA 31/27, RV 71/27, PA67/45, PCWP 46, PA 49%, Fick CO: 3.4 b. ECHO (03/2014) EF 30-35%, grade II DD, mild MR,  RV poorly visualized appears mildly decreased c. RHC (05/2014) RA 13, RV 54/4/11, PA 60/22 (37), PCWP 17, Fick CO/CI: 4.4 / 1.9, Thermo CO/CI: 3.8 /1.6, PVR 5.2 WU, PA 57% and 59%  . Diverticulosis of colon   . Gout   . Hemorrhoids, internal   . Hyperlipidemia   . Hypertension   . Morbid obesity (HCC)   . Nonischemic cardiomyopathy (HCC)    a. LHC (02/2011) normal coronaries  . OSA on CPAP   . Paroxysmal atrial fibrillation (HCC)   . Pneumonia 2000s X 1  . Pulmonary hypertension   . Renal insufficiency   . Type II diabetes mellitus (HCC)   . Ventricular tachycardia [facility])    s/p MDT ICD implant    Current Outpatient Prescriptions    Medication Sig Dispense Refill  . acetaminophen (TYLENOL) 325 MG tablet Take 650 mg by mouth every 6 (six) hours as needed (pain). Reported on 10/11/2015    . albuterol (PROAIR HFA) 108 (90 BASE) MCG/ACT inhaler Inhale 2 puffs into the lungs every 6 (six) hours as needed for wheezing or shortness of breath. Reported on 02/06/2016    . allopurinol (ZYLOPRIM) 300 MG tablet Take 300 mg by mouth daily.    Marland Kitchen atorvastatin (LIPITOR) 40 MG tablet Take 1 tablet (40 mg total) by mouth daily. 90 tablet 6  . bisoprolol (ZEBETA) 5 MG tablet Take 1 tablet (5 mg total) by mouth daily. 30 tablet 5  . colchicine 0.6 MG tablet Take 0.6 mg by mouth daily as needed (Gout). Reported on 02/06/2016    . digoxin (LANOXIN) 0.125 MG tablet Take 0.5 tablets (0.0625 mg total) by mouth every other day. 45 tablet 3  . Fluticasone-Salmeterol (ADVAIR DISKUS) 250-50 MCG/DOSE AEPB Inhale 1 puff into the lungs every 12 (twelve) hours as needed (shortness of breath). Reported on 02/06/2016    . hydrALAZINE (APRESOLINE) 50 MG tablet Take 1 tablet (50 mg total) by mouth 3 (three) times daily. 270 tablet 6  . insulin detemir (LEVEMIR) 100 UNIT/ML injection Inject 36 Units into the skin at bedtime.     . isosorbide mononitrate (IMDUR) 30 MG 24 hr tablet TAKE 1 TABLET (30 MG TOTAL) BY MOUTH 2 (TWO) TIMES DAILY. 180 tablet 3  . linagliptin (TRADJENTA) 5 MG TABS tablet Take 1 tablet (5 mg total) by mouth daily. 30 tablet 3  . methimazole (TAPAZOLE) 10 MG tablet Take 10-20 mg by mouth 3 (three) times daily. Take 20 mg (2 tablets) in the morning, 10 mg (1 tab) at noon and 10 mg (1 tab) in the evening    . metolazone (ZAROXOLYN) 2.5 MG tablet Take 2.5 mg by mouth daily as needed (edema). Reported on 02/06/2016    . montelukast (SINGULAIR) 10 MG tablet Take 10 mg by mouth daily as needed. For shortness of breath or wheezing    . potassium chloride SA (K-DUR,KLOR-CON) 20 MEQ tablet Take 20-40 mEq by mouth 2 (two) times daily. Take 40 meq (2 tablets) in  the morning and 20 meq (1 tablet) in the afternoon    . rivaroxaban (XARELTO) 20 MG TABS tablet Take 1 tablet (20 mg total) by mouth daily with supper. 30 tablet 11  . sacubitril-valsartan (ENTRESTO) 24-26 MG Take 1 tablet by mouth 2 (two) times daily. 60 tablet 3  . spironolactone (ALDACTONE) 25 MG tablet Take 1/2 tablet by mouth once a day    . torsemide (DEMADEX) 20 MG tablet Take 40 mg (2 tabs) in am and 20 mg (1 tab)  in pm 270 tablet 3   No current facility-administered medications for this encounter.     Vitals:   09/03/16 0927  BP: 124/62  BP Location: Left Arm  Patient Position: Sitting  Cuff Size: Large  Pulse: 68  SpO2: 95%  Weight: 285 lb 3.2 oz (129.4 kg)   Wt Readings from Last 3 Encounters:  09/03/16 285 lb 3.2 oz (129.4 kg)  07/30/16 288 lb 3.2 oz (130.7 kg)  07/16/16 286 lb 6.4 oz (129.9 kg)     PHYSICAL EXAM: General:  Well appearing. NAD.   HEENT: Normal.  Neck: Thick. JVP difficult to assess d/t body but does not appear elevated. Carotids 2+ bilaterally; no bruits. No thyromegaly or nodule noted.  Cor: PMI nonpalpable. RRR. No rubs or gallops. 2/6 TR murmur. 2/6 MR Lungs: Clear, normal effort Abdomen: obese, soft, NT, ND, no HSM. No bruits or masses. +BS  Extremities: no cyanosis, clubbing, rash. No peripheral edema.  Neuro: alert & orientedx3, cranial nerves grossly intact. Moves all 4 extremities w/o difficulty. Affect pleasant.   ASSESSMENT & PLAN:   1) Chronic systolic HF: NICM s/p BiV Medtronic, EF 25% 06/2016.   - NYHA II symptoms . Volume status stable.  - Continue torsemide 40 mg in am and 20 mg in am.  - Increase 49-51 mg entresto BID. Recent creatinine 1.41. Repeat BMET in 10-14 days. - Decrease potassium to 40 meq daily. May be able to cut back further depending on labs today.  - Continue bisoprolol 5 mg daily.  - Continue digoxin 0.0625 mg every other day. - Continue hydralazine 50 mg TID. Continue Imdur 30 mg BID - Reinforced fluid  restriction to < 2 L daily, sodium restriction to less than 2000 mg daily, and the importance of daily weights.   2) CKD stage III -  Followed by nephrology.  - Most recent creatinine 1.4.  3) HTN - Meds as above. current regimen.  4) OSA - Continue to wear CPAP nightly 5) PAF-  S/P Succesful DC-CV 10/17/2015  (Initial episode 03/2014) Recurrent A fib 10/2015 with successful DC/CV on 10/17/15.   - No further Afib by Optivol.  - Continue Xarelto 20 mg. He states he has not missed any doses.  - LFTs stable 9/17.  - Needs yearly eye exam and he is aware.  - Now off amiodarone with thyrotoxicity. Evaluated by EP and for now will follow. If he has further AF will need to consider tikosyn or ablation. 6) Obesity:   - Continued to encouraged to lose weight and increase activity as able.  7) Amiodarone thyroxicity  - Off Amiodarone. Continue Methimazole 10 mg BID for now.  (recently decreased by PCP) - PCP following thyroid.  - No further Afib.   Labs and Meds as above. Follow up 6 weeks with Pharm D for med titration. Follow up 3 months with MD.   Mariam Dollar Roshni Burbano [name]  09/03/2016 9:45 AM   Total time spent > 25 minutes. Over half that time spent discussing the above.

## 2016-09-03 NOTE — Telephone Encounter (Signed)
Patient aware. Repeat labs 12/6

## 2016-09-03 NOTE — Telephone Encounter (Signed)
-----   Message from Graciella Freer, [name] sent at 09/03/2016 11:12 AM EST ----- STOP K supp. Recheck scheduled, by can we move to end of next week instead with further med adjustment?     [name] [address] [place], [name] 09/03/2016 11:12 AM

## 2016-09-03 NOTE — Patient Instructions (Signed)
Increase Entresto to 49/51 mg Twice daily   Decrease Potassium to 40 meq daily  Labs today  Labs in 10-14 days  Your physician recommends that you schedule a follow-up appointment in: 6 weeks with Fara Chute D  Your physician recommends that you schedule a follow-up appointment in: 3 months with Dr Gala Romney

## 2016-09-10 ENCOUNTER — Ambulatory Visit (HOSPITAL_COMMUNITY)
Admission: RE | Admit: 2016-09-10 | Discharge: 2016-09-10 | Disposition: A | Discharge status: Home / Self Care | Payer: Medicare Other | Source: Ambulatory Visit | Attending: Internal Medicine | Admitting: Internal Medicine

## 2016-09-10 DIAGNOSIS — I509 Heart failure, unspecified: Secondary | ICD-10-CM | POA: Insufficient documentation

## 2016-09-10 LAB — BASIC METABOLIC PANEL
ANION GAP: 4 — AB (ref 5–15)
BUN: 20 mg/dL (ref 6–20)
CALCIUM: 9.1 mg/dL (ref 8.9–10.3)
CHLORIDE: 105 mmol/L (ref 101–111)
CO2: 30 mmol/L (ref 22–32)
Creatinine, Ser: 1.36 mg/dL — ABNORMAL HIGH (ref 0.61–1.24)
GFR calc non Af Amer: 57 mL/min — ABNORMAL LOW (ref >60)
Glucose, Bld: 157 mg/dL — ABNORMAL HIGH (ref 65–99)
Potassium: 4.5 mmol/L (ref 3.5–5.1)
Sodium: 139 mmol/L (ref 135–145)

## 2016-09-15 ENCOUNTER — Other Ambulatory Visit: Payer: Self-pay | Admitting: Internal Medicine

## 2016-09-16 ENCOUNTER — Inpatient Hospital Stay (HOSPITAL_COMMUNITY): Admission: RE | Admit: 2016-09-16 | Payer: Medicare Other | Source: Ambulatory Visit

## 2016-09-18 ENCOUNTER — Ambulatory Visit (INDEPENDENT_AMBULATORY_CARE_PROVIDER_SITE_OTHER): Payer: Medicare Other

## 2016-09-18 DIAGNOSIS — Z95810 Presence of automatic (implantable) cardiac defibrillator: Secondary | ICD-10-CM

## 2016-09-18 DIAGNOSIS — I509 Heart failure, unspecified: Secondary | ICD-10-CM | POA: Diagnosis not present

## 2016-09-18 DIAGNOSIS — I5022 Chronic systolic (congestive) heart failure: Secondary | ICD-10-CM | POA: Diagnosis not present

## 2016-09-19 NOTE — Progress Notes (Signed)
Agree with having him take prn metolazone.     [name] [address]" [place], [name] 09/19/2016 11:41 AM

## 2016-09-19 NOTE — Progress Notes (Signed)
EPIC Encounter for ICM Monitoring  Patient Name: Gary Hutchinson is a 55 y.o. male Date: 09/19/2016 Primary Care Physican: Precious Reel, MD Primary Cardiologist:[name] Electrophysiologist: Allred Dry Weight:281 lbs       Heart Failure questions reviewed, pt symptomatic with 2 lb weight gain  Thoracic impedance abnormal suggesting fluid accumulation since 09/03/2016.  Labs: 09/10/2016 Creatinine 1.36, BUN 20, Potassium 4.5, Sodium 139, EGFR 57->60 09/03/2016 Creatinine 1.39, BUN 34, Potassium 4.8, Sodium 136, EGFR 56->60  08/06/2016 Creatinine 1.41, BUN 21, Potassium 4.7, Sodium 131, EGFR 55->60  06/24/2016 Creatinine 1.16, BUN 27, Potassium 3.7, Sodium 137, EGFR >60  12/13/2015 Creatinine 1.47, BUN 25, Potassium 4.2, Sodium 136, EGFR 52->60  11/21/2015 Creatinine 1.38, BUN 32, Potassium 3.9, Sodium 137  11/17/2015 Creatinine 2.04, BUN 50, Potassium 3.2, Sodium 138, EGFR 35-41  11/09/2015 Creatinine 1.69, BUN 35, Potassium 3.9, Sodium 136  10/25/2015 Creatinine 1.91, BUN 44, Potassium 3.1, Sodium 135, EGFR 38-44  10/19/2015 Creatinine 2.15, BUN 40, Potassium 3.6, Sodium 137, EGFR 33-38 10/18/2015 Creatinine 1.85, BUN 37, Potassium 3.9, Sodium 138, EGFR 40-46  10/17/2015 Creatinine 1.78, BUN 40, Potassium 3.8, Sodium 142, EGFR 42-48 10/16/2015 Creatinine 1.93, BUN 51, Potassium 3.4, Sodium 140, EGFR 38-44   Recommendations:  Patient stated he will take Metolazone 2.5 mg 1 tablet as prescribed.  Provided ICM direct number.   He reported drinking > 2 liters a day of fluids and advised to limit to <2 liters a day.   Patient reported Potassium supplement has been stopped.    Follow-up plan: ICM clinic phone appointment on 09/25/2016 to recheck fluid levels.  Office appointment with Dr Rayann Heman on 10/01/2016.  Copy of ICM check sent to primary cardiologist and device physician.   ICM trend: 09/18/2016       [name], RN 09/19/2016 10:53 AM

## 2016-09-21 ENCOUNTER — Other Ambulatory Visit (HOSPITAL_COMMUNITY): Payer: Self-pay | Admitting: Internal Medicine

## 2016-09-25 ENCOUNTER — Ambulatory Visit (INDEPENDENT_AMBULATORY_CARE_PROVIDER_SITE_OTHER): Payer: Medicare Other

## 2016-09-25 DIAGNOSIS — I509 Heart failure, unspecified: Secondary | ICD-10-CM

## 2016-09-25 DIAGNOSIS — Z95810 Presence of automatic (implantable) cardiac defibrillator: Secondary | ICD-10-CM

## 2016-09-25 NOTE — Progress Notes (Signed)
EPIC Encounter for ICM Monitoring  Patient Name: Gary Hutchinson is a 55 y.o. male Date: 09/25/2016 Primary Care Physican: Precious Reel, MD Primary Cardiologist:[name] Electrophysiologist: Allred Dry Weight:280 lbs BiV Pacing: 97.5%              Heart Failure questions reviewed, pt asymptomatic   Thoracic impedance returned to normal after taking Metolazone 1 tablet.  Patient reported he has missed some of the evening doses of Torsemide and discussed the importance of taking it twice a day as ordered.   Labs: 09/10/2016 Creatinine 1.36, BUN 20, Potassium 4.5, Sodium 139, EGFR 57->60 09/03/2016 Creatinine 1.39, BUN 34, Potassium 4.8, Sodium 136, EGFR 56->60  08/06/2016 Creatinine 1.41, BUN 21, Potassium 4.7, Sodium 131, EGFR 55->60  06/24/2016 Creatinine 1.16, BUN 27, Potassium 3.7, Sodium 137, EGFR >60  12/13/2015 Creatinine 1.47, BUN 25, Potassium 4.2, Sodium 136, EGFR 52->60  11/21/2015 Creatinine 1.38, BUN 32, Potassium 3.9, Sodium 137  11/17/2015 Creatinine 2.04, BUN 50, Potassium 3.2, Sodium 138, EGFR 35-41  11/09/2015 Creatinine 1.69, BUN 35, Potassium 3.9, Sodium 136  10/25/2015 Creatinine 1.91, BUN 44, Potassium 3.1, Sodium 135, EGFR 38-44  10/19/2015 Creatinine 2.15, BUN 40, Potassium 3.6, Sodium 137, EGFR 33-38 10/18/2015 Creatinine 1.85, BUN 37, Potassium 3.9, Sodium 138, EGFR 40-46  10/17/2015 Creatinine 1.78, BUN 40, Potassium 3.8, Sodium 142, EGFR 42-48 10/16/2015 Creatinine 1.93, BUN 51, Potassium 3.4, Sodium 140, EGFR 38-44   Recommendations: No changes.  Reinforced to limit low salt food choices to 2000 mg day and limiting fluid intake to < 2 liters per day. Encouraged to call for fluid symptoms.   He has direct ICM phone number  Follow-up plan: ICM clinic phone appointment on 10/20/2016.  Copy of ICM check sent to primary cardiologist and device physician.   ICM trend: 09/25/2016       [name], RN 09/25/2016 2:35 PM

## 2016-10-01 ENCOUNTER — Encounter: Payer: Medicare Other | Admitting: Nurse Practitioner

## 2016-10-01 ENCOUNTER — Encounter: Payer: Medicare Other | Admitting: Internal Medicine

## 2016-10-07 ENCOUNTER — Other Ambulatory Visit (HOSPITAL_COMMUNITY): Payer: Self-pay | Admitting: *Deleted

## 2016-10-07 MED ORDER — POTASSIUM CHLORIDE CRYS ER 20 MEQ PO TBCR: 40.0000 meq | EXTENDED_RELEASE_TABLET | Freq: Every day | ORAL | Status: DC

## 2016-10-07 MED ORDER — TORSEMIDE 20 MG PO TABS: ORAL_TABLET | ORAL | 3 refills | Status: DC

## 2016-10-09 ENCOUNTER — Other Ambulatory Visit (HOSPITAL_COMMUNITY): Payer: Self-pay | Admitting: *Deleted

## 2016-10-09 MED ORDER — POTASSIUM CHLORIDE CRYS ER 20 MEQ PO TBCR: 40.0000 meq | EXTENDED_RELEASE_TABLET | Freq: Every day | ORAL | 3 refills | Status: DC

## 2016-10-15 ENCOUNTER — Ambulatory Visit (HOSPITAL_COMMUNITY)
Admission: RE | Admit: 2016-10-15 | Discharge: 2016-10-15 | Disposition: A | Discharge status: Home / Self Care | Payer: Medicare Other | Source: Ambulatory Visit | Attending: Cardiology | Admitting: Cardiology

## 2016-10-15 DIAGNOSIS — Z7901 Long term (current) use of anticoagulants: Secondary | ICD-10-CM | POA: Insufficient documentation

## 2016-10-15 DIAGNOSIS — Z79899 Other long term (current) drug therapy: Secondary | ICD-10-CM | POA: Diagnosis not present

## 2016-10-15 DIAGNOSIS — I480 Paroxysmal atrial fibrillation: Secondary | ICD-10-CM | POA: Insufficient documentation

## 2016-10-15 DIAGNOSIS — I130 Hypertensive heart and chronic kidney disease with heart failure and stage 1 through stage 4 chronic kidney disease, or unspecified chronic kidney disease: Secondary | ICD-10-CM | POA: Diagnosis not present

## 2016-10-15 DIAGNOSIS — I429 Cardiomyopathy, unspecified: Secondary | ICD-10-CM | POA: Diagnosis present

## 2016-10-15 DIAGNOSIS — N183 Chronic kidney disease, stage 3 (moderate): Secondary | ICD-10-CM | POA: Diagnosis not present

## 2016-10-15 DIAGNOSIS — E1122 Type 2 diabetes mellitus with diabetic chronic kidney disease: Secondary | ICD-10-CM | POA: Diagnosis not present

## 2016-10-15 DIAGNOSIS — Z95810 Presence of automatic (implantable) cardiac defibrillator: Secondary | ICD-10-CM | POA: Insufficient documentation

## 2016-10-15 DIAGNOSIS — G4733 Obstructive sleep apnea (adult) (pediatric): Secondary | ICD-10-CM | POA: Diagnosis not present

## 2016-10-15 DIAGNOSIS — I5022 Chronic systolic (congestive) heart failure: Secondary | ICD-10-CM | POA: Diagnosis not present

## 2016-10-15 NOTE — Progress Notes (Signed)
HPI:  [name] is a 56 year old AA male with a history of chronic systolic heart failure, ICM s/p ICD, HTN, DM II, OSA on CPAP, history of ventricular tachycardia status post AICD placement in 2009, atrial fibrillation and morbid obesity.   He presents today for pharmacist-led HF medication titration. At his last HF clinic visit on 09/03/16, his Sherryll Burger was increased to 49-51 mg BID and his KCl was discontinued. Follow up BMET stable. He states that he is still actively losing weight by increasing exercise and eating more fruits and vegetables.   Optivol - fluid index below threshold. Thoracic impedence OK. No further Afib. Pt activity still between 1-2 hrs daily.     . Shortness of breath/dyspnea on exertion? No - walks about 4000-5000 steps at least 3-4 days/wk, limited by his knees . Orthopnea/PND? No . Edema? No . Lightheadedness/dizziness? No . Daily weights at home? Yes - 281-283 lb . Blood pressure/heart rate monitoring at home? Yes - SBP 90-100 mmHg range . Following low-sodium/fluid-restricted diet? Yes - drinks water with sugar free flavoring and diet soda; not adding table salt, reads labels and tries to keep sodium below 2000 mg     HF Medications: Bisoprolol 5 mg PO daily  Digoxin 0.0625 mg PO every other day  Hydralazine 50 mg PO TID Isosorbide mononitrate 30 mg PO BID  Metolazone 2.5 mg PO daily PRN weight gain/edema Entresto 49-51 mg PO BID Spironolactone 12.5 mg PO daily  Torsemide 40 mg QAM and 20 mg QPM  Has the patient been experiencing any side effects to the medications prescribed?  No  Does the patient have any problems obtaining medications due to transportation or finances?   No - has deductible to meet this year -- advised him that with his HF medications he can use [employer] ($1500 toward copays through 02/04/2017) Billing ID: [id number] Person Code: [id number] RX Group: [id number] RX BIN: [id number] PCN for Part D: [id number]   Understanding of regimen:  good Understanding of indications: good Potential of compliance: good Patient understands to avoid NSAIDs. Patient understands to avoid decongestants.    Pertinent Lab Values: . 09/10/16: Serum creatinine 1.36 (BL ~1.3-1.4.), BUN 20, Potassium 4.5, Sodium 139 . 09/03/16: BNP 103.7 . 12/13/15: Digoxin 0.7   Vital Signs: . Weight: 283 lb (dry weight: 285 lb) . Blood pressure: 98/60 mmHg  . Heart rate: 66 bpm   Assessment: 1. Chronicsystolic CHF (EF 46% 10/2015), due to NICM s/p ICD Medtronic. NYHA class IIsymptoms. - Volume status stable - With soft BP and HR, no room to uptitrate any of his medications - Continue torsemide 40 mg QAM and 20 mg QPM, metolazone 2.5 mg daily PRN edema/weight gain, bisoprolol 5 mg daily, digoxin 0.0625 mg QOD, hydralazine 50 mg TID, Imdur 30 mg BID, Entresto 49-51 mg PO BID, spironolactone 12.5 mg daily - Basic disease state pathophysiology, medication indication, mechanism and side effects reviewed at length with patient and he verbalized understanding 2. CKD stage III  - Followed by nephrology - Creatinine much improved, new baseline ~1.3-1.4 3. HTN - Controlled - Continue current medications 4. OSA  - Continue to wear CPAP nightly 5. PAF - S/P Succesful DC-CV 10/17/2015 - Continue Xarelto 20 mg daily 6. Obesity:  - Congratulated on losing 2 lbs since last clinic visit - Encouraged continued weight loss  Plan: 1) Medication changes: Based on clinical presentation, vital signs and recent labs will continue current regimen 2) Labs: Due for digoxin level at next clinic visit 3) Follow-up: Dr.  Makenzye Troutman on 12/03/16   Erika K. Bonnye Fava, PharmD, BCPS, CPP Clinical Pharmacist Pager: [phone] Phone: [phone] 10/15/2016 1:25 PM   Patient seen and examined in pharmD clinic. Doing very well. Agree with above. ICD interrogated personally.   Yelina Sarratt,MD 10:52 AM

## 2016-10-15 NOTE — Patient Instructions (Signed)
It was great to see you today! Congratulations on your continued weight loss.   Please continue all of your medications as prescribed.   You are scheduled to see Dr. Gala Romney on 12/03/16.

## 2016-10-20 ENCOUNTER — Ambulatory Visit (INDEPENDENT_AMBULATORY_CARE_PROVIDER_SITE_OTHER): Payer: Medicare Other

## 2016-10-20 ENCOUNTER — Telehealth: Payer: Self-pay

## 2016-10-20 DIAGNOSIS — Z95810 Presence of automatic (implantable) cardiac defibrillator: Secondary | ICD-10-CM

## 2016-10-20 DIAGNOSIS — I509 Heart failure, unspecified: Secondary | ICD-10-CM | POA: Diagnosis not present

## 2016-10-20 NOTE — Progress Notes (Signed)
EPIC Encounter for ICM Monitoring  Patient Name: Gary Hutchinson is a 56 y.o. male Date: 10/20/2016 Primary Care Physican: Precious Reel, MD Primary Cardiologist:[name] Electrophysiologist: Allred Dry Weight:unknown BiV Pacing: 97.9%        Attempted ICM call and unable to reach.  Transmission reviewed.   Thoracic impedance abnormal suggesting fluid accumulation.  Labs: 09/10/2016 Creatinine 1.36, BUN 20, Potassium 4.5, Sodium 139, EGFR 57->60 09/03/2016 Creatinine 1.39, BUN 34, Potassium 4.8, Sodium 136, EGFR 56->60  08/06/2016 Creatinine 1.41, BUN 21, Potassium 4.7, Sodium 131, EGFR 55->60  06/24/2016 Creatinine 1.16, BUN 27, Potassium 3.7, Sodium 137, EGFR >60  12/13/2015 Creatinine 1.47, BUN 25, Potassium 4.2, Sodium 136, EGFR 52->60  11/21/2015 Creatinine 1.38, BUN 32, Potassium 3.9, Sodium 137  11/17/2015 Creatinine 2.04, BUN 50, Potassium 3.2, Sodium 138, EGFR 35-41  11/09/2015 Creatinine 1.69, BUN 35, Potassium 3.9, Sodium 136  10/25/2015 Creatinine 1.91, BUN 44, Potassium 3.1, Sodium 135, EGFR 38-44  10/19/2015 Creatinine 2.15, BUN 40, Potassium 3.6, Sodium 137, EGFR 33-38 10/18/2015 Creatinine 1.85, BUN 37, Potassium 3.9, Sodium 138, EGFR 40-46  10/17/2015 Creatinine 1.78, BUN 40, Potassium 3.8, Sodium 142, EGFR 42-48 10/16/2015 Creatinine 1.93, BUN 51, Potassium 3.4, Sodium 140, EGFR 38-44   Recommendations:  NONE- Unable to reach  Follow-up plan: ICM clinic phone appointment on 11/04/2016 to recheck fluid levels.  Copy of ICM check sent to primary cardiologist and device physician.   3 month ICM trend: 10/20/2016   1 Year ICM trend:      [name], RN 10/20/2016 8:48 AM

## 2016-10-20 NOTE — Progress Notes (Signed)
Patient returned call.  He stated he is feeling fine but his fluid may be accumulating because of the holidays and he ate in restaurants several times last week.   He also has missed a few evening Torsemide dosages.  He may be drinking too much fluid because he eats a lot ice.   Reminded him that restaurant foods are very high in sodium and to limit fluid intake to 2 liters.  He stated he knows and will get back on track with his medication.  He stated he usually takes extra Torsemide if needed and will take Furosemide 20 mg 2 tablets bid x 2 days and then return to prescribed dosage of 40 mg am and 20 mg pm.   Office appointment with Dr [name] on 10/23/2016 and fluid levels will be check at that time. Will recheck on 11/04/2016.

## 2016-10-20 NOTE — Telephone Encounter (Signed)
Remote ICM transmission received.  Attempted patient call and no answer 

## 2016-10-23 ENCOUNTER — Encounter: Payer: Medicare Other | Admitting: Internal Medicine

## 2016-10-30 ENCOUNTER — Other Ambulatory Visit: Payer: Self-pay | Admitting: Internal Medicine

## 2016-11-04 ENCOUNTER — Ambulatory Visit (INDEPENDENT_AMBULATORY_CARE_PROVIDER_SITE_OTHER): Payer: Medicare Other

## 2016-11-04 DIAGNOSIS — Z95810 Presence of automatic (implantable) cardiac defibrillator: Secondary | ICD-10-CM

## 2016-11-04 DIAGNOSIS — I509 Heart failure, unspecified: Secondary | ICD-10-CM

## 2016-11-04 NOTE — Progress Notes (Signed)
EPIC Encounter for ICM Monitoring  Patient Name: Gary Hutchinson is a 56 y.o. male Date: 11/04/2016 Primary Care Physican: Precious Reel, MD Primary Cardiologist:[name] Electrophysiologist: Allred Dry Weight:281 lbs BiV Pacing: 98%           Heart Failure questions reviewed, pt symptomatic with 1-2 lbs weight gain  Thoracic impedance continues to be abnormal suggesting fluid accumulation.  Labs: 09/10/2016 Creatinine 1.36, BUN 20, Potassium 4.5, Sodium 139, EGFR 57->60 09/03/2016 Creatinine 1.39, BUN 34, Potassium 4.8, Sodium 136, EGFR 56->60  08/06/2016 Creatinine 1.41, BUN 21, Potassium 4.7, Sodium 131, EGFR 55->60  06/24/2016 Creatinine 1.16, BUN 27, Potassium 3.7, Sodium 137, EGFR >60  12/13/2015 Creatinine 1.47, BUN 25, Potassium 4.2, Sodium 136, EGFR 52->60  11/21/2015 Creatinine 1.38, BUN 32, Potassium 3.9, Sodium 137  11/17/2015 Creatinine 2.04, BUN 50, Potassium 3.2, Sodium 138, EGFR 35-41  11/09/2015 Creatinine 1.69, BUN 35, Potassium 3.9, Sodium 136  10/25/2015 Creatinine 1.91, BUN 44, Potassium 3.1, Sodium 135, EGFR 38-44  10/19/2015 Creatinine 2.15, BUN 40, Potassium 3.6, Sodium 137, EGFR 33-38 10/18/2015 Creatinine 1.85, BUN 37, Potassium 3.9, Sodium 138, EGFR 40-46  10/17/2015 Creatinine 1.78, BUN 40, Potassium 3.8, Sodium 142, EGFR 42-48 10/16/2015 Creatinine 1.93, BUN 51, Potassium 3.4, Sodium 140, EGFR 38-44   Recommendations:   Patient stated he will take Metolazone 2.5 mg tablet tomorrow to help eliminate the fluid.  He admits to not follow low salt diet and drinking a lot of fluids.  He knows he should resume a low salt diet but hasn't been able to lately.  Reminded him to limit salt intake to 2000 mg daily and 64 oz fluid daily.  Follow-up plan: ICM clinic phone appointment on 11/07/2016 to recheck fluid levels.  Office appointment with Dr Rayann Heman 11/12/2016 and Dr Haroldine Laws 12/03/2016.  Copy of ICM check sent to primary cardiologist and device physician.   3  month ICM trend: 11/04/2016   1 Year ICM trend:      [name], RN 11/04/2016 2:53 PM

## 2016-11-07 ENCOUNTER — Telehealth: Payer: Self-pay

## 2016-11-07 ENCOUNTER — Ambulatory Visit (INDEPENDENT_AMBULATORY_CARE_PROVIDER_SITE_OTHER): Payer: Medicare Other

## 2016-11-07 DIAGNOSIS — Z95810 Presence of automatic (implantable) cardiac defibrillator: Secondary | ICD-10-CM

## 2016-11-07 DIAGNOSIS — I509 Heart failure, unspecified: Secondary | ICD-10-CM

## 2016-11-07 NOTE — Telephone Encounter (Signed)
Left voice mail message to send ICM remote transmission today.

## 2016-11-07 NOTE — Telephone Encounter (Addendum)
Patient returned call and will send remote transmission later today.  He said he has been taking extra Torsemide 20 mg 2 tablets twice a day since we last spoke.  He did not take the Metolazone as previously discussed.  Will review transmission when received.

## 2016-11-07 NOTE — Progress Notes (Signed)
EPIC Encounter for ICM Monitoring  Patient Name: KAZIMIR HARTNETT is a 56 y.o. male Date: 11/07/2016 Primary Care Physican: Precious Reel, MD Primary Cardiologist:[name] Electrophysiologist: Allred Dry Weight:279 lbs BiV Pacing: 99.9%      Heart Failure questions reviewed, pt asymptomatic   Thoracic impedance normal after taking extra Torsemide for 3 days.  Labs: 09/10/2016 Creatinine 1.36, BUN 20, Potassium 4.5, Sodium 139, EGFR 57->60 09/03/2016 Creatinine 1.39, BUN 34, Potassium 4.8, Sodium 136, EGFR 56->60  08/06/2016 Creatinine 1.41, BUN 21, Potassium 4.7, Sodium 131, EGFR 55->60  06/24/2016 Creatinine 1.16, BUN 27, Potassium 3.7, Sodium 137, EGFR >60  12/13/2015 Creatinine 1.47, BUN 25, Potassium 4.2, Sodium 136, EGFR 52->60  11/21/2015 Creatinine 1.38, BUN 32, Potassium 3.9, Sodium 137  11/17/2015 Creatinine 2.04, BUN 50, Potassium 3.2, Sodium 138, EGFR 35-41  11/09/2015 Creatinine 1.69, BUN 35, Potassium 3.9, Sodium 136  10/25/2015 Creatinine 1.91, BUN 44, Potassium 3.1, Sodium 135, EGFR 38-44  10/19/2015 Creatinine 2.15, BUN 40, Potassium 3.6, Sodium 137, EGFR 33-38 10/18/2015 Creatinine 1.85, BUN 37, Potassium 3.9, Sodium 138, EGFR 40-46  10/17/2015 Creatinine 1.78, BUN 40, Potassium 3.8, Sodium 142, EGFR 42-48 10/16/2015 Creatinine 1.93, BUN 51, Potassium 3.4, Sodium 140, EGFR 38-44    Recommendations: No changes. Reminded to limit dietary salt intake to 2000 mg/day and fluid intake to < 2 liters/day. Encouraged to call for fluid symptoms.  Follow-up plan: ICM clinic phone appointment on 01/01/2017.  Office appointment with Dr Rayann Heman 11/12/2016 and Dr Haroldine Laws 12/03/2016.  Copy of ICM check sent to device physician.   3 month ICM trend: 11/07/2016   1 Year ICM trend:      [name], RN 11/07/2016 2:54 PM

## 2016-11-12 ENCOUNTER — Ambulatory Visit (INDEPENDENT_AMBULATORY_CARE_PROVIDER_SITE_OTHER): Payer: Medicare Other | Admitting: Internal Medicine

## 2016-11-12 ENCOUNTER — Other Ambulatory Visit: Payer: Self-pay

## 2016-11-12 ENCOUNTER — Encounter: Payer: Self-pay | Admitting: Internal Medicine

## 2016-11-12 VITALS — BP 126/68 | HR 80 | Ht 65.5 in | Wt 287.2 lb

## 2016-11-12 DIAGNOSIS — I428 Other cardiomyopathies: Secondary | ICD-10-CM

## 2016-11-12 DIAGNOSIS — I480 Paroxysmal atrial fibrillation: Secondary | ICD-10-CM | POA: Diagnosis not present

## 2016-11-12 DIAGNOSIS — I5022 Chronic systolic (congestive) heart failure: Secondary | ICD-10-CM

## 2016-11-12 DIAGNOSIS — I472 Ventricular tachycardia, unspecified: Secondary | ICD-10-CM

## 2016-11-12 DIAGNOSIS — Z95810 Presence of automatic (implantable) cardiac defibrillator: Secondary | ICD-10-CM | POA: Diagnosis not present

## 2016-11-12 NOTE — Patient Instructions (Addendum)
Medication Instructions:  Your physician recommends that you continue on your current medications as directed. Please refer to the Current Medication list given to you today.   Labwork: None ordered   Testing/Procedures: None ordered   Follow-Up: Your physician wants you to follow-up in: 6 months with Gary Balsam, NP You will receive a reminder letter in the mail two months in advance. If you don't receive a letter, please call our office to schedule the follow-up appointment.   Remote monitoring is used to monitor your  ICD from home. This monitoring reduces the number of office visits required to check your device to one time per year. It allows [place] to keep an eye on the functioning of your device to ensure it is working properly. You are scheduled for a device check from home on 02/21/17. You may send your transmission at any time that day. If you have a wireless device, the transmission will be sent automatically. After your physician reviews your transmission, you will receive a postcard with your next transmission date.     Any Other Special Instructions Will Be Listed Below (If Applicable).     If you need a refill on your cardiac medications before your next appointment, please call your pharmacy.

## 2016-11-12 NOTE — Progress Notes (Signed)
Electrophysiology Office Note   Date:  11/12/2016   ID:  JOBEY CONG, DOB [birth year], MRN [id number]  PCP:  Gary Pounds, MD  Cardiologist:  CHF clinic Primary Electrophysiologist: Hillis Range, MD    Chief Complaint  Patient presents with  . Atrial Fibrillation     History of Present Illness: Gary Hutchinson is a 56 y.o. male who presents today for electrophysiology follow-up.   Exercise tolerance has improved with CRT upgrade.  He remains in sinus rhythm off Amiodarone (stopped due to hyperthyroid).  Dr Gary Hutchinson is following thryoid.  Weight is improving.  Today, he denies symptoms of palpitations, chest pain,  orthopnea, PND,  claudication, dizziness, presyncope, syncope, bleeding, or neurologic sequela. The patient is tolerating medications without difficulties and is otherwise without complaint today.    Past Medical History:  Diagnosis Date  . AICD (automatic cardioverter/defibrillator) present   . Alcohol abuse    now quit  . Anemia    iron defi  . Arthritis    "fingers, knees, some days all my joints ache" (11/16/2015)  . Asthma   . Cholecystitis   . Chronic systolic congestive heart failure (HCC)    a. RHC (02/2011) RA 31/27, RV 71/27, PA67/45, PCWP 46, PA 49%, Fick CO: 3.4 b. ECHO (03/2014) EF 30-35%, grade II DD, mild MR, RV poorly visualized appears mildly decreased c. RHC (05/2014) RA 13, RV 54/4/11, PA 60/22 (37), PCWP 17, Fick CO/CI: 4.4 / 1.9, Thermo CO/CI: 3.8 /1.6, PVR 5.2 WU, PA 57% and 59%  . Diverticulosis of colon   . Gout   . Hemorrhoids, internal   . Hyperlipidemia   . Hypertension   . Morbid obesity (HCC)   . Nonischemic cardiomyopathy (HCC)    a. LHC (02/2011) normal coronaries  . OSA on CPAP   . Paroxysmal atrial fibrillation (HCC)   . Pneumonia 2000s X 1  . Pulmonary hypertension   . Renal insufficiency   . Type II diabetes mellitus (HCC)   . Ventricular tachycardia [facility])    s/p MDT ICD implant   Past Surgical History:  Procedure  Laterality Date  . CARDIAC CATHETERIZATION  03/08/2004   EF 25-30%  . CARDIOVERSION N/A 10/17/2015   Procedure: CARDIOVERSION;  Surgeon: Gary Mixer, MD;  Location: [facility] ENDOSCOPY;  Service: Cardiovascular;  Laterality: N/A;  . EP IMPLANTABLE DEVICE N/A 11/16/2015   Procedure: BiVI Upgrade;  Surgeon: Hillis Range, MD;  [employer] [address] [place]  . INSERTION OF ICD  06/2008   ICD- [employer]   . RIGHT HEART CATHETERIZATION N/A 05/23/2014   Procedure: RIGHT HEART CATH;  Surgeon: Gary Patty, MD;  Location: [facility] CATH LAB;  Service: Cardiovascular;  Laterality: N/A;  . TRANSTHORACIC ECHOCARDIOGRAM  05/27/2008   EF 30-35%  . US ECHOCARDIOGRAPHY  08/22/2008   EF 30-35%     Current Outpatient Prescriptions  Medication Sig Dispense Refill  . acetaminophen (TYLENOL) 325 MG tablet Take 650 mg by mouth every 6 (six) hours as needed (pain). Reported on 10/11/2015    . albuterol (PROAIR HFA) 108 (90 BASE) MCG/ACT inhaler Inhale 2 puffs into the lungs every 6 (six) hours as needed for wheezing or shortness of breath. Reported on 02/06/2016    . allopurinol (ZYLOPRIM) 300 MG tablet Take 300 mg by mouth daily.    Marland Kitchen atorvastatin (LIPITOR) 40 MG tablet Take 1 tablet (40 mg total) by mouth daily. 90 tablet 6  . bisoprolol (ZEBETA) 5 MG tablet Take 1 tablet (5 mg total) by mouth  daily. 30 tablet 5  . colchicine 0.6 MG tablet Take 0.6 mg by mouth daily as needed (Gout). Reported on 02/06/2016    . digoxin (LANOXIN) 0.125 MG tablet Take 0.5 tablets (0.0625 mg total) by mouth every other day. 45 tablet 3  . Fluticasone-Salmeterol (ADVAIR DISKUS) 250-50 MCG/DOSE AEPB Inhale 1 puff into the lungs every 12 (twelve) hours as needed (shortness of breath). Reported on 02/06/2016    . hydrALAZINE (APRESOLINE) 50 MG tablet Take 1 tablet (50 mg total) by mouth 3 (three) times daily. 270 tablet 6  . insulin detemir (LEVEMIR) 100 UNIT/ML injection Inject 36 Units into the skin at bedtime.     . isosorbide mononitrate (IMDUR) 30 MG  24 hr tablet TAKE 1 TABLET (30 MG TOTAL) BY MOUTH 2 (TWO) TIMES DAILY. 180 tablet 3  . linagliptin (TRADJENTA) 5 MG TABS tablet Take 1 tablet (5 mg total) by mouth daily. 30 tablet 3  . methimazole (TAPAZOLE) 10 MG tablet Take 10 mg by mouth daily.     . metolazone (ZAROXOLYN) 2.5 MG tablet Take 2.5 mg by mouth daily as needed (edema). Reported on 02/06/2016    . montelukast (SINGULAIR) 10 MG tablet Take 10 mg by mouth daily as needed. For shortness of breath or wheezing    . rivaroxaban (XARELTO) 20 MG TABS tablet Take 1 tablet (20 mg total) by mouth daily with supper. 30 tablet 11  . sacubitril-valsartan (ENTRESTO) 49-51 MG Take 1 tablet by mouth 2 (two) times daily. 60 tablet 3  . spironolactone (ALDACTONE) 25 MG tablet Take 1/2 tablet by mouth once a day    . torsemide (DEMADEX) 20 MG tablet Take 40 mg (2 tabs) in am and 20 mg (1 tab) in pm 270 tablet 3   No current facility-administered medications for this visit.     Allergies:   Patient has no known allergies.   Social History:  The patient  reports that he has never smoked. He has never used smokeless tobacco. He reports that he drinks alcohol. He reports that he does not use drugs.   Family History:  The patient's family history includes Cancer in his father; Hypertension in his mother.    ROS:  Please see the history of present illness.   All other systems are reviewed and negative.    PHYSICAL EXAM: VS:  BP 126/68   Pulse 80   Ht 5' 5.5" (1.664 m)   Wt 287 lb 3.2 oz (130.3 kg)   BMI 47.07 kg/m  , BMI Body mass index is 47.07 kg/m. GEN: overweight in no acute distress  HEENT: normal  Neck: no JVD, no carotid bruits, or masses Cardiac: RRR; no murmurs, rubs, or gallops,no edema  Respiratory:  Decreased BS at the bases, normal work of breathing GI: soft, nontender, nondistended, + BS MS: no deformity or atrophy  Skin: warm and dry, device pocket is well healed Neuro:  Strength and sensation are intact Psych: euthymic  mood, full affect   Device interrogation is reviewed today in detail.  See PaceArt for details.   Recent Labs: 06/12/2016: ALT 25; Magnesium 2.1 06/13/2016: TSH 0.007 09/03/2016: B Natriuretic Peptide 103.7 09/10/2016: BUN 20; Creatinine, Ser 1.36; Potassium 4.5; Sodium 139    Lipid Panel     Component Value Date/Time   CHOL 181 04/09/2011 0323   TRIG 137 04/09/2011 0323   HDL 37 (L) 04/09/2011 0323   CHOLHDL 4.9 04/09/2011 0323   VLDL 27 04/09/2011 0323   LDLCALC 117 (H) 04/09/2011  0323     Wt Readings from Last 3 Encounters:  11/12/16 287 lb 3.2 oz (130.3 kg)  10/15/16 283 lb 9.6 oz (128.6 kg)  09/03/16 285 lb 3.2 oz (129.4 kg)     Other studies Reviewed: Additional studies/ records that were reviewed today include: CHF clinic notes,      ASSESSMENT AND PLAN:  1.  Nonischemic CM/ chronic systolic dysfunction 2 gram sodium restriction and daily weights encouraged On good medical therapy followed in ICM device clinic Importance of remote monitoring discussed  Normal BiV ICD function  See Pace Art report No changes today  2. Morbid obesity Weight loss is strongly advised.  He is making some progress (311 lbs a year ago!)  3. VT No recent arrhythmias  4. AFib On xarelto Off amiodarone Could consider ablation if AF increases in the future ([place] AF)  5. OSA Compliant with BIPAP   Current medicines are reviewed at length with the patient today.   The patient does not have concerns regarding his medicines.  The following changes were made today:  None  Return to see EP NP in 6 months Carelink  Signed, Hillis Range, MD  11/12/2016 9:48 AM     [facility] [address] [place] [address] [phone] (office) [phone] (fax)

## 2016-11-13 LAB — CUP PACEART INCLINIC DEVICE CHECK
Battery Voltage: 3 V
Brady Statistic AP VP Percent: 2.09 %
Brady Statistic AP VS Percent: 0.05 %
Brady Statistic AS VP Percent: 96.2 %
Brady Statistic RA Percent Paced: 2.14 %
Brady Statistic RV Percent Paced: 10.44 %
HighPow Impedance: 68 Ohm
Implantable Lead Implant Date: 20090901
Implantable Lead Implant Date: 20170210
Implantable Lead Implant Date: 20170210
Implantable Lead Location: 753858
Implantable Lead Model: 5076
Lead Channel Impedance Value: 399 Ohm
Lead Channel Impedance Value: 418 Ohm
Lead Channel Impedance Value: 418 Ohm
Lead Channel Impedance Value: 456 Ohm
Lead Channel Impedance Value: 532 Ohm
Lead Channel Impedance Value: 665 Ohm
Lead Channel Impedance Value: 779 Ohm
Lead Channel Impedance Value: 817 Ohm
Lead Channel Pacing Threshold Amplitude: 0.75 V
Lead Channel Pacing Threshold Amplitude: 1.125 V
Lead Channel Pacing Threshold Pulse Width: 0.4 ms
Lead Channel Pacing Threshold Pulse Width: 0.4 ms
Lead Channel Sensing Intrinsic Amplitude: 10.625 mV
Lead Channel Sensing Intrinsic Amplitude: 4.25 mV
Lead Channel Setting Pacing Amplitude: 1.75 V
Lead Channel Setting Sensing Sensitivity: 0.3 mV
MDC IDC LEAD LOCATION: 753859
MDC IDC LEAD LOCATION: 753860
MDC IDC MSMT BATTERY REMAINING LONGEVITY: 101 mo
MDC IDC MSMT LEADCHNL LV IMPEDANCE VALUE: 513 Ohm
MDC IDC MSMT LEADCHNL LV IMPEDANCE VALUE: 722 Ohm
MDC IDC MSMT LEADCHNL LV IMPEDANCE VALUE: 836 Ohm
MDC IDC MSMT LEADCHNL LV PACING THRESHOLD AMPLITUDE: 1 V
MDC IDC MSMT LEADCHNL LV PACING THRESHOLD PULSEWIDTH: 0.4 ms
MDC IDC MSMT LEADCHNL RA SENSING INTR AMPL: 4.375 mV
MDC IDC MSMT LEADCHNL RV IMPEDANCE VALUE: 285 Ohm
MDC IDC MSMT LEADCHNL RV IMPEDANCE VALUE: 361 Ohm
MDC IDC MSMT LEADCHNL RV SENSING INTR AMPL: 11.625 mV
MDC IDC PG IMPLANT DT: 20170210
MDC IDC SESS DTM: 20180207153049
MDC IDC SET LEADCHNL LV PACING AMPLITUDE: 2 V
MDC IDC SET LEADCHNL LV PACING PULSEWIDTH: 0.4 ms
MDC IDC SET LEADCHNL RV PACING AMPLITUDE: 2.25 V
MDC IDC SET LEADCHNL RV PACING PULSEWIDTH: 0.4 ms
MDC IDC STAT BRADY AS VS PERCENT: 1.65 %

## 2016-11-25 ENCOUNTER — Observation Stay (HOSPITAL_COMMUNITY)
Admission: EM | Admit: 2016-11-25 | Discharge: 2016-11-27 | Disposition: A | Discharge status: Home / Self Care | Payer: Medicare Other | Attending: Internal Medicine | Admitting: Internal Medicine

## 2016-11-25 ENCOUNTER — Emergency Department (HOSPITAL_COMMUNITY): Payer: Medicare Other

## 2016-11-25 ENCOUNTER — Encounter (HOSPITAL_COMMUNITY): Payer: Self-pay

## 2016-11-25 DIAGNOSIS — E1122 Type 2 diabetes mellitus with diabetic chronic kidney disease: Secondary | ICD-10-CM | POA: Diagnosis not present

## 2016-11-25 DIAGNOSIS — I272 Pulmonary hypertension, unspecified: Secondary | ICD-10-CM | POA: Insufficient documentation

## 2016-11-25 DIAGNOSIS — I480 Paroxysmal atrial fibrillation: Secondary | ICD-10-CM | POA: Diagnosis not present

## 2016-11-25 DIAGNOSIS — M109 Gout, unspecified: Secondary | ICD-10-CM | POA: Insufficient documentation

## 2016-11-25 DIAGNOSIS — J45909 Unspecified asthma, uncomplicated: Secondary | ICD-10-CM | POA: Insufficient documentation

## 2016-11-25 DIAGNOSIS — Z7901 Long term (current) use of anticoagulants: Secondary | ICD-10-CM | POA: Insufficient documentation

## 2016-11-25 DIAGNOSIS — Z833 Family history of diabetes mellitus: Secondary | ICD-10-CM | POA: Insufficient documentation

## 2016-11-25 DIAGNOSIS — G4733 Obstructive sleep apnea (adult) (pediatric): Secondary | ICD-10-CM | POA: Diagnosis present

## 2016-11-25 DIAGNOSIS — A419 Sepsis, unspecified organism: Secondary | ICD-10-CM | POA: Diagnosis present

## 2016-11-25 DIAGNOSIS — Z95810 Presence of automatic (implantable) cardiac defibrillator: Secondary | ICD-10-CM | POA: Insufficient documentation

## 2016-11-25 DIAGNOSIS — E1165 Type 2 diabetes mellitus with hyperglycemia: Secondary | ICD-10-CM | POA: Insufficient documentation

## 2016-11-25 DIAGNOSIS — M19041 Primary osteoarthritis, right hand: Secondary | ICD-10-CM | POA: Insufficient documentation

## 2016-11-25 DIAGNOSIS — J1089 Influenza due to other identified influenza virus with other manifestations: Secondary | ICD-10-CM | POA: Diagnosis not present

## 2016-11-25 DIAGNOSIS — M19042 Primary osteoarthritis, left hand: Secondary | ICD-10-CM | POA: Insufficient documentation

## 2016-11-25 DIAGNOSIS — R42 Dizziness and giddiness: Secondary | ICD-10-CM | POA: Diagnosis present

## 2016-11-25 DIAGNOSIS — K5730 Diverticulosis of large intestine without perforation or abscess without bleeding: Secondary | ICD-10-CM | POA: Insufficient documentation

## 2016-11-25 DIAGNOSIS — Z79899 Other long term (current) drug therapy: Secondary | ICD-10-CM | POA: Insufficient documentation

## 2016-11-25 DIAGNOSIS — I4892 Unspecified atrial flutter: Secondary | ICD-10-CM | POA: Insufficient documentation

## 2016-11-25 DIAGNOSIS — Z809 Family history of malignant neoplasm, unspecified: Secondary | ICD-10-CM | POA: Insufficient documentation

## 2016-11-25 DIAGNOSIS — D509 Iron deficiency anemia, unspecified: Secondary | ICD-10-CM | POA: Insufficient documentation

## 2016-11-25 DIAGNOSIS — K648 Other hemorrhoids: Secondary | ICD-10-CM | POA: Insufficient documentation

## 2016-11-25 DIAGNOSIS — E0590 Thyrotoxicosis, unspecified without thyrotoxic crisis or storm: Secondary | ICD-10-CM | POA: Insufficient documentation

## 2016-11-25 DIAGNOSIS — I472 Ventricular tachycardia: Secondary | ICD-10-CM | POA: Diagnosis not present

## 2016-11-25 DIAGNOSIS — E785 Hyperlipidemia, unspecified: Secondary | ICD-10-CM | POA: Insufficient documentation

## 2016-11-25 DIAGNOSIS — Z794 Long term (current) use of insulin: Secondary | ICD-10-CM | POA: Diagnosis not present

## 2016-11-25 DIAGNOSIS — N183 Chronic kidney disease, stage 3 (moderate): Secondary | ICD-10-CM | POA: Insufficient documentation

## 2016-11-25 DIAGNOSIS — IMO0002 Reserved for concepts with insufficient information to code with codable children: Secondary | ICD-10-CM

## 2016-11-25 DIAGNOSIS — I130 Hypertensive heart and chronic kidney disease with heart failure and stage 1 through stage 4 chronic kidney disease, or unspecified chronic kidney disease: Secondary | ICD-10-CM | POA: Diagnosis not present

## 2016-11-25 DIAGNOSIS — N179 Acute kidney failure, unspecified: Secondary | ICD-10-CM | POA: Insufficient documentation

## 2016-11-25 DIAGNOSIS — I5022 Chronic systolic (congestive) heart failure: Secondary | ICD-10-CM | POA: Diagnosis not present

## 2016-11-25 DIAGNOSIS — Z8249 Family history of ischemic heart disease and other diseases of the circulatory system: Secondary | ICD-10-CM | POA: Insufficient documentation

## 2016-11-25 DIAGNOSIS — Z4502 Encounter for adjustment and management of automatic implantable cardiac defibrillator: Secondary | ICD-10-CM

## 2016-11-25 DIAGNOSIS — R609 Edema, unspecified: Secondary | ICD-10-CM

## 2016-11-25 DIAGNOSIS — M170 Bilateral primary osteoarthritis of knee: Secondary | ICD-10-CM | POA: Diagnosis not present

## 2016-11-25 DIAGNOSIS — Z6841 Body Mass Index (BMI) 40.0 and over, adult: Secondary | ICD-10-CM | POA: Insufficient documentation

## 2016-11-25 DIAGNOSIS — I428 Other cardiomyopathies: Secondary | ICD-10-CM | POA: Insufficient documentation

## 2016-11-25 DIAGNOSIS — I861 Scrotal varices: Secondary | ICD-10-CM | POA: Insufficient documentation

## 2016-11-25 DIAGNOSIS — I4891 Unspecified atrial fibrillation: Secondary | ICD-10-CM | POA: Diagnosis present

## 2016-11-25 DIAGNOSIS — N433 Hydrocele, unspecified: Secondary | ICD-10-CM | POA: Insufficient documentation

## 2016-11-25 DIAGNOSIS — I447 Left bundle-branch block, unspecified: Secondary | ICD-10-CM | POA: Insufficient documentation

## 2016-11-25 LAB — CBC WITH DIFFERENTIAL/PLATELET
BASOS ABS: 0 10*3/uL (ref 0.0–0.1)
Basophils Relative: 0 %
EOS ABS: 0.4 10*3/uL (ref 0.0–0.7)
Eosinophils Relative: 3 %
HCT: 34.3 % — ABNORMAL LOW (ref 39.0–52.0)
HEMOGLOBIN: 11.4 g/dL — AB (ref 13.0–17.0)
LYMPHS ABS: 1.3 10*3/uL (ref 0.7–4.0)
LYMPHS PCT: 9 %
MCH: 27.7 pg (ref 26.0–34.0)
MCHC: 33.2 g/dL (ref 30.0–36.0)
MCV: 83.3 fL (ref 78.0–100.0)
Monocytes Absolute: 1 10*3/uL (ref 0.1–1.0)
Monocytes Relative: 7 %
Neutro Abs: 11 10*3/uL — ABNORMAL HIGH (ref 1.7–7.7)
Neutrophils Relative %: 81 %
Platelets: 262 10*3/uL (ref 150–400)
RBC: 4.12 MIL/uL — ABNORMAL LOW (ref 4.22–5.81)
RDW: 14.4 % (ref 11.5–15.5)
WBC: 13.7 10*3/uL — AB (ref 4.0–10.5)

## 2016-11-25 LAB — COMPREHENSIVE METABOLIC PANEL
ALT: 23 U/L (ref 17–63)
ANION GAP: 13 (ref 5–15)
AST: 30 U/L (ref 15–41)
Albumin: 2.6 g/dL — ABNORMAL LOW (ref 3.5–5.0)
Alkaline Phosphatase: 175 U/L — ABNORMAL HIGH (ref 38–126)
BUN: 48 mg/dL — ABNORMAL HIGH (ref 6–20)
CALCIUM: 8.6 mg/dL — AB (ref 8.9–10.3)
CHLORIDE: 100 mmol/L — AB (ref 101–111)
CO2: 22 mmol/L (ref 22–32)
Creatinine, Ser: 2.32 mg/dL — ABNORMAL HIGH (ref 0.61–1.24)
GFR calc non Af Amer: 30 mL/min — ABNORMAL LOW (ref >60)
GFR, EST AFRICAN AMERICAN: 35 mL/min — AB (ref >60)
Glucose, Bld: 183 mg/dL — ABNORMAL HIGH (ref 65–99)
Potassium: 3.5 mmol/L (ref 3.5–5.1)
SODIUM: 135 mmol/L (ref 135–145)
Total Bilirubin: 0.9 mg/dL (ref 0.3–1.2)
Total Protein: 7.1 g/dL (ref 6.5–8.1)

## 2016-11-25 LAB — I-STAT CG4 LACTIC ACID, ED: Lactic Acid, Venous: 1.52 mmol/L (ref 0.5–1.9)

## 2016-11-25 LAB — PROTIME-INR
INR: 2.44
PROTHROMBIN TIME: 26.9 s — AB (ref 11.4–15.2)

## 2016-11-25 LAB — GLUCOSE, CAPILLARY: Glucose-Capillary: 123 mg/dL — ABNORMAL HIGH (ref 65–99)

## 2016-11-25 MED ORDER — PIPERACILLIN-TAZOBACTAM 3.375 G IVPB 30 MIN
3.3750 g | Freq: Once | INTRAVENOUS | Status: AC
  Administered 2016-11-25: 3.375 g via INTRAVENOUS
  Filled 2016-11-25: qty 50

## 2016-11-25 MED ORDER — ACETAMINOPHEN 325 MG PO TABS
650.0000 mg | ORAL_TABLET | Freq: Once | ORAL | Status: AC | PRN
  Administered 2016-11-25: 650 mg via ORAL

## 2016-11-25 MED ORDER — ACETAMINOPHEN 325 MG PO TABS
ORAL_TABLET | ORAL | Status: AC
  Filled 2016-11-25: qty 2

## 2016-11-25 MED ORDER — VANCOMYCIN HCL 10 G IV SOLR
2500.0000 mg | Freq: Once | INTRAVENOUS | Status: AC
  Administered 2016-11-25: 2500 mg via INTRAVENOUS
  Filled 2016-11-25: qty 2500

## 2016-11-25 MED ORDER — PIPERACILLIN-TAZOBACTAM 3.375 G IVPB
3.3750 g | Freq: Three times a day (TID) | INTRAVENOUS | Status: DC
  Administered 2016-11-26: 3.375 g via INTRAVENOUS
  Filled 2016-11-25 (×2): qty 50

## 2016-11-25 MED ORDER — VANCOMYCIN HCL 10 G IV SOLR: 1500.0000 mg | INTRAVENOUS | Status: DC

## 2016-11-25 MED ORDER — SODIUM CHLORIDE 0.9 % IV BOLUS (SEPSIS)
3000.0000 mL | Freq: Once | INTRAVENOUS | Status: AC
  Administered 2016-11-25: 3000 mL via INTRAVENOUS

## 2016-11-25 NOTE — ED Triage Notes (Signed)
Pt reports he was walking today around 1800 when he suddenly became dizzy and lightheaded. Denies LOC. He reports having an ICD which was replaced last year. He is unsure if his ICD "went off" or not. He states he heard a "pop" coming from somewhere but denies feeling anything. A&ox4. Denies any pain.

## 2016-11-25 NOTE — ED Notes (Signed)
Patient transported to X-ray 

## 2016-11-25 NOTE — Progress Notes (Signed)
Pharmacy Antibiotic Note  Gary Hutchinson is a 56 y.o. male admitted on 11/25/2016 with sepsis.  Pharmacy has been consulted for vancomcyin/zosyn dosing. Tmax 102, WBC 13.7. SCr 2.32 on admit, normalized CrCl~37.  Plan: Zosyn 3.375g IV ( inf) x1; then 3.375g IV q8h (4h inf) Vancomycin 2500mg  IV x1; then 1500mg  IV q24h Monitor clinical progress, c/s, renal function, abx plan/LOT Vancomycin trough as indicated   Height: 5\' 5"  (165.1 cm) Weight: 270 lb (122.5 kg) IBW/kg (Calculated) : 61.5  Temp (24hrs), Avg:102 F (38.9 C), Min:102 F (38.9 C), Max:102 F (38.9 C)   Recent Labs Lab 11/25/16 1848 11/25/16 1900  WBC 13.7*  --   CREATININE 2.32*  --   LATICACIDVEN  --  1.52    Estimated Creatinine Clearance: 43.7 mL/min (by C-G formula based on SCr of 2.32 mg/dL (H)).    No Known Allergies   Babs Bertin, PharmD, BCPS Clinical Pharmacist 11/25/2016 8:08 PM

## 2016-11-25 NOTE — H&P (Signed)
History and Physical    Gary Hutchinson:[id number] DOB: April 02, 1961 DOA: 11/25/2016  PCP: [name], MD   Patient coming from: Home  Chief Complaint: Light-headedness, dizziness, ICD fired  HPI: Gary Hutchinson is a 56 y.o. gentleman with a history of Nonischemic cardiomyopathy (EF 25-30%), paroxysmal atrial fibrillation (CHADS-Vasc score of 3, anticoagulated with Xarelto), ventricular tachycardia S/P Bi-V AICD, HTN, HLD, DM, and OSA on CPAP qHS who presented to the ED for evaluation after two days of not feeling well with light-headedness and dizziness (but no syncope).  Today, he felt a "pop" in his chest and though his ICD fired.  No significant chest pain with this.  He has had chills but no fever documented until he arrived in the ED.  No cough, cold, or congestion.  No known sick contacts.  No recent procedures.  No recent medication changes.  ED Course: Temp 102.  HR 57.  BP 86/45.  LA 1.52.  WBC 13.7.  BUN 48.  Creatinine 2.32.  INR 2.44.  Urine is not infected.  Chest xray shows pulmonary vascular congestion but no pneumonia.  Blood cultures are pending.  Flu screen is pending.  S/P NS 30cc/kg and empiric vanc and zosyn in the ED.  Hospitalist asked to admit.   Review of Systems: As per HPI otherwise 10 point review of systems negative.    Past Medical History:  Diagnosis Date  . AICD (automatic cardioverter/defibrillator) present   . Alcohol abuse    now quit  . Anemia    iron defi  . Arthritis    "fingers, knees, some days all my joints ache" (11/16/2015)  . Asthma   . Cholecystitis   . Chronic systolic congestive heart failure (HCC)    a. RHC (02/2011) RA 31/27, RV 71/27, PA67/45, PCWP 46, PA 49%, Fick CO: 3.4 b. ECHO (03/2014) EF 30-35%, grade II DD, mild MR, RV poorly visualized appears mildly decreased c. RHC (05/2014) RA 13, RV 54/4/11, PA 60/22 (37), PCWP 17, Fick CO/CI: 4.4 / 1.9, Thermo CO/CI: 3.8 /1.6, PVR 5.2 WU, PA 57% and 59%  . Diverticulosis of colon   .  Gout   . Hemorrhoids, internal   . Hyperlipidemia   . Hypertension   . Morbid obesity (HCC)   . Nonischemic cardiomyopathy (HCC)    a. LHC (02/2011) normal coronaries  . OSA on CPAP   . Paroxysmal atrial fibrillation (HCC)   . Pneumonia 2000s X 1  . Pulmonary hypertension   . Renal insufficiency   . Type II diabetes mellitus (HCC)   . Ventricular tachycardia [facility])    s/p MDT ICD implant    Past Surgical History:  Procedure Laterality Date  . CARDIAC CATHETERIZATION  03/08/2004   EF 25-30%  . CARDIOVERSION N/A 10/17/2015   Procedure: CARDIOVERSION;  Surgeon: [name], MD;  Location: [facility] ENDOSCOPY;  Service: Cardiovascular;  Laterality: N/A;  . EP IMPLANTABLE DEVICE N/A 11/16/2015   Procedure: BiVI Upgrade;  Surgeon: Hillis Range, MD;  [employer] [address] [place]  . INSERTION OF ICD  06/2008   ICD- [employer]   . RIGHT HEART CATHETERIZATION N/A 05/23/2014   Procedure: RIGHT HEART CATH;  Surgeon: [name], MD;  Location: [facility] CATH LAB;  Service: Cardiovascular;  Laterality: N/A;  . TRANSTHORACIC ECHOCARDIOGRAM  05/27/2008   EF 30-35%  . US ECHOCARDIOGRAPHY  08/22/2008   EF 30-35%     reports that he has never smoked. He has never used smokeless tobacco. He reports that he  drinks alcohol. He reports that he does not use drugs.  He is not married.  His oldest son is his next of kin.  No Known Allergies  Family History  Problem Relation Age of Onset  . Cancer Father   . Diabetes      grandparents  . Hypertension Mother   . CAD      No family history     Prior to Admission medications   Medication Sig Start Date End Date Taking? Authorizing Provider  acetaminophen (TYLENOL) 325 MG tablet Take 325-650 mg by mouth every 6 (six) hours as needed (for pain). Reported on 10/11/2015   Yes Historical Provider, MD  albuterol (PROAIR HFA) 108 (90 BASE) MCG/ACT inhaler Inhale 2 puffs into the lungs every 6 (six) hours as needed for wheezing or shortness of breath. Reported on  02/06/2016   Yes Historical Provider, MD  allopurinol (ZYLOPRIM) 300 MG tablet Take 300 mg by mouth daily.   Yes Historical Provider, MD  atorvastatin (LIPITOR) 40 MG tablet Take 1 tablet (40 mg total) by mouth daily. 07/14/14  Yes Bevelyn Buckles [name], MD  bisoprolol (ZEBETA) 5 MG tablet Take 1 tablet (5 mg total) by mouth daily. 05/12/16  Yes [name], MD  colchicine 0.6 MG tablet Take 0.6 mg by mouth daily as needed (for gout flares). Reported on 02/06/2016   Yes Historical Provider, MD  digoxin (LANOXIN) 0.125 MG tablet Take 0.5 tablets (0.0625 mg total) by mouth every other day. 01/24/16  Yes Bevelyn Buckles [name], MD  Fluticasone-Salmeterol (ADVAIR DISKUS) 250-50 MCG/DOSE AEPB Inhale 1 puff into the lungs every 12 (twelve) hours as needed (shortness of breath). Reported on 02/06/2016   Yes Historical Provider, MD  hydrALAZINE (APRESOLINE) 50 MG tablet Take 1 tablet (50 mg total) by mouth 3 (three) times daily. Patient taking differently: Take 50 mg by mouth 2 (two) times daily.  08/26/16  Yes Graciella Freer, [name]  insulin detemir (LEVEMIR) 100 UNIT/ML injection Inject 36 Units into the skin at bedtime.  03/13/14  Yes Aundria Rud, NP  isosorbide mononitrate (IMDUR) 30 MG 24 hr tablet TAKE 1 TABLET (30 MG TOTAL) BY MOUTH 2 (TWO) TIMES DAILY. 12/06/15  Yes Amy D Clegg, NP  linagliptin (TRADJENTA) 5 MG TABS tablet Take 1 tablet (5 mg total) by mouth daily. 03/13/14  Yes Aundria Rud, NP  methimazole (TAPAZOLE) 10 MG tablet Take 10 mg by mouth daily.    Yes Historical Provider, MD  metolazone (ZAROXOLYN) 2.5 MG tablet Take 2.5 mg by mouth daily as needed (edema). Reported on 02/06/2016   Yes Historical Provider, MD  montelukast (SINGULAIR) 10 MG tablet Take 10 mg by mouth daily as needed (for shortness of breath or wheezing).    Yes Historical Provider, MD  rivaroxaban (XARELTO) 20 MG TABS tablet Take 1 tablet (20 mg total) by mouth daily with supper. 06/24/16  Yes Mariam Dollar Tillery, [name]    sacubitril-valsartan (ENTRESTO) 49-51 MG Take 1 tablet by mouth 2 (two) times daily. 09/03/16  Yes Graciella Freer, [name]  spironolactone (ALDACTONE) 25 MG tablet Take 1/2 tablet by mouth once a day 10/28/15  Yes Historical Provider, MD  torsemide (DEMADEX) 20 MG tablet Take 40 mg (2 tabs) in am and 20 mg (1 tab) in pm Patient taking differently: Take 20-40 mg by mouth See admin instructions. 40 mg in the morning and 20 mg in the evening 10/07/16  Yes Amy D Filbert Schilder, NP    Physical Exam: Vitals:  11/25/16 2200 11/25/16 2215 11/25/16 2244 11/25/16 2313  BP: (!) 85/64 98/59 (!) 120/52 99/71  Pulse: 81 84  81  Resp: 15     Temp:    98.8 F (37.1 C)  TempSrc:    Oral  SpO2: 99% 98%  100%  Weight:    131.5 kg (289 lb 14.4 oz)  Height:    5\' 5"  (1.651 m)      Constitutional: NAD, calm, comfortable, appears worried but not acutely decompensating Vitals:   11/25/16 2200 11/25/16 2215 11/25/16 2244 11/25/16 2313  BP: (!) 85/64 98/59 (!) 120/52 99/71  Pulse: 81 84  81  Resp: 15     Temp:    98.8 F (37.1 C)  TempSrc:    Oral  SpO2: 99% 98%  100%  Weight:    131.5 kg (289 lb 14.4 oz)  Height:    5\' 5"  (1.651 m)   Eyes: PERRL, lids and conjunctivae normal ENMT: Mucous membranes are moist. Posterior pharynx clear of any exudate or lesions. Normal dentition.  Neck: normal appearance, thick but supple,  No masses Respiratory: clear to auscultation bilaterally, no wheezing, no crackles. Normal respiratory effort. No accessory muscle use.  Cardiovascular: Normal rate, regular rhythm, no murmurs / rubs / gallops. No extremity edema. 2+ pedal pulses. GI: abdomen is obese, protuberant.  No distention.  No tenderness.  Bowel sounds are present. Musculoskeletal:  No joint deformity in upper and lower extremities. Good ROM, no contractures. Normal muscle tone.  Skin: no rashes, warm and dry Neurologic: CN 2-12 grossly intact. Sensation intact, Strength symmetric bilaterally. Psychiatric:  Normal judgment and insight. Alert and oriented x 3. Normal mood.     Labs on Admission: I have personally reviewed following labs and imaging studies  CBC:  Recent Labs Lab 11/25/16 1848  WBC 13.7*  NEUTROABS 11.0*  HGB 11.4*  HCT 34.3*  MCV 83.3  PLT 262   Basic Metabolic Panel:  Recent Labs Lab 11/25/16 1848  NA 135  K 3.5  CL 100*  CO2 22  GLUCOSE 183*  BUN 48*  CREATININE 2.32*  CALCIUM 8.6*   GFR: Estimated Creatinine Clearance: 45.5 mL/min (by C-G formula based on SCr of 2.32 mg/dL (H)). Liver Function Tests:  Recent Labs Lab 11/25/16 1848  AST 30  ALT 23  ALKPHOS 175*  BILITOT 0.9  PROT 7.1  ALBUMIN 2.6*   Coagulation Profile:  Recent Labs Lab 11/25/16 1848  INR 2.44   CBG:  Recent Labs Lab 11/25/16 2308  GLUCAP 123*    Urine analysis: Urinalysis    Component Value Date/Time   COLORURINE YELLOW 11/26/2016 0014   APPEARANCEUR CLEAR 11/26/2016 0014   LABSPEC 1.010 11/26/2016 0014   PHURINE 5.0 11/26/2016 0014   GLUCOSEU NEGATIVE 11/26/2016 0014   HGBUR NEGATIVE 11/26/2016 0014   BILIRUBINUR NEGATIVE 11/26/2016 0014   KETONESUR NEGATIVE 11/26/2016 0014   PROTEINUR NEGATIVE 11/26/2016 0014   UROBILINOGEN 1.0 04/06/2011 2108   NITRITE NEGATIVE 11/26/2016 0014   LEUKOCYTESUR NEGATIVE 11/26/2016 0014     Sepsis Labs:  Lactic acid level 1.52  Radiological Exams on Admission: Dg Chest 2 View  Result Date: 11/25/2016 CLINICAL DATA:  Dizziness.  ICD activation today. EXAM: CHEST  2 VIEW COMPARISON:  11/17/2015 FINDINGS: Cardiac pacemaker leads in stable position. Cardiomediastinal silhouette is enlarged. Mediastinal contours appear intact. There is no evidence of focal airspace consolidation, pleural effusion or pneumothorax. Pulmonary vascular congestion. Osseous structures are without acute abnormality. Soft tissues are grossly normal. IMPRESSION: Enlarged  cardiac silhouette. Pulmonary vascular congestion. Stable positioning of ICD  leads. Electronically Signed   By: Ted Mcalpine M.D.   On: 11/25/2016 20:21    EKG: Independently reviewed. Bi-V Paced.  Assessment/Plan Principal Problem:   Sepsis (HCC) Active Problems:   Insulin dependent type 2 diabetes mellitus, uncontrolled (HCC)   Morbid obesity (HCC)   Obstructive sleep apnea   Automatic implantable cardioverter-defibrillator in situ   Atrial fibrillation (HCC)   Chronic systolic heart failure (HCC)      Sepsis, etiology unclear --Empiric vanc and zosyn --F/U blood cultures --F/U flu screen --NS at 100cc/hr for one additional liter --Check procalcitonin --Monitor blood pressure  AKI --HOLD Entresto, diuretics --Hydrate cautiously with NS  Relative hypotension --Hold anti-hypertensives, including diuretics, at least overnight --Monitor response to hydration  Paroxysmal atrial fibrillation --Anticoagulated with Xarelto --Digoxin --Check TSH  ICD fire --Telemetry monitoring --Troponin --Defer to cardiology  DM --Continue home dose of levemir --SSI AC    DVT prophylaxis: Anticoagulated with Xarelto Code Status: FULL Family Communication: Patient alone in the ED at time of admission. Disposition Plan: Expect he will go home at discharge. Consults called: Cardiology  Admission status: Place in observation with telemetry monitoring.   TIME SPENT: 60 minutes   Jerene Bears MD [facility] Pager [phone]  If 7PM-7AM, please contact night-coverage [email or web address] Password [facility]  11/25/2016, 11:48 PM

## 2016-11-25 NOTE — ED Provider Notes (Signed)
[facility]-EMERGENCY DEPT Provider Note   CSN: [id number] Arrival date & time: 11/25/16  1820     History   Chief Complaint Chief Complaint  Patient presents with  . Near Syncope    HPI Gary Hutchinson is a 56 y.o. male. Pt with hx of HTN, HLD, Dm, NICM, a-fib on Xarelto, AICD presents after a near syncopal episode today. Pt reports he felt lightheaded and dizzy then heard and felt a pop. He thinks his AICD may have fired. This has never happened before. He has not had any chest pain or SOB. He endorses mild chills today but has otherwise been asymptomatic. No cough or dysuria.  HPI  Past Medical History:  Diagnosis Date  . AICD (automatic cardioverter/defibrillator) present   . Alcohol abuse    now quit  . Anemia    iron defi  . Arthritis    "fingers, knees, some days all my joints ache" (11/16/2015)  . Asthma   . Cholecystitis   . Chronic systolic congestive heart failure (HCC)    a. RHC (02/2011) RA 31/27, RV 71/27, PA67/45, PCWP 46, PA 49%, Fick CO: 3.4 b. ECHO (03/2014) EF 30-35%, grade II DD, mild MR, RV poorly visualized appears mildly decreased c. RHC (05/2014) RA 13, RV 54/4/11, PA 60/22 (37), PCWP 17, Fick CO/CI: 4.4 / 1.9, Thermo CO/CI: 3.8 /1.6, PVR 5.2 WU, PA 57% and 59%  . Diverticulosis of colon   . Gout   . Hemorrhoids, internal   . Hyperlipidemia   . Hypertension   . Morbid obesity (HCC)   . Nonischemic cardiomyopathy (HCC)    a. LHC (02/2011) normal coronaries  . OSA on CPAP   . Paroxysmal atrial fibrillation (HCC)   . Pneumonia 2000s X 1  . Pulmonary hypertension   . Renal insufficiency   . Type II diabetes mellitus (HCC)   . Ventricular tachycardia [facility])    s/p MDT ICD implant    Patient Active Problem List   Diagnosis Date Noted  . Sepsis (HCC) 11/25/2016  . 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 CHF (congestive heart failure) (HCC) 11/16/2015  . Chronic systolic heart failure (HCC) 10/25/2015  . Atrial flutter (HCC)   . LBBB (left bundle  branch block) 10/06/2015  . Cardiogenic shock (HCC) 03/08/2014  . Atrial fibrillation (HCC) 03/06/2014  . Ventricular tachycardia (HCC)   . Automatic implantable cardioverter-defibrillator in situ 12/06/2009  . Pulmonary HTN (HCC) 05/02/2008  . HEMORRHOIDS, INTERNAL 05/02/2008  . DIVERTICULOSIS OF COLON 05/02/2008  . ALCOHOL ABUSE, HX OF 05/02/2008  . CHOLECYSTITIS, UNSPECIFIED 04/10/2008  . Insulin dependent type 2 diabetes mellitus, uncontrolled (HCC) 05/07/2007  . GOUT 05/07/2007  . Morbid obesity (HCC) 05/07/2007  . ANEMIA-IRON DEFICIENCY 05/07/2007  . Obstructive sleep apnea 05/07/2007  . HTN (hypertension) 05/07/2007    Past Surgical History:  Procedure Laterality Date  . CARDIAC CATHETERIZATION  03/08/2004   EF 25-30%  . CARDIOVERSION N/A 10/17/2015   Procedure: CARDIOVERSION;  Surgeon: [name], MD;  Location: [facility] ENDOSCOPY;  Service: Cardiovascular;  Laterality: N/A;  . EP IMPLANTABLE DEVICE N/A 11/16/2015   Procedure: BiVI Upgrade;  Surgeon: Hillis Range, MD;  [employer] [address] [place]  . INSERTION OF ICD  06/2008   ICD- [employer]   . RIGHT HEART CATHETERIZATION N/A 05/23/2014   Procedure: RIGHT HEART CATH;  Surgeon: [name], MD;  Location: [facility] CATH LAB;  Service: Cardiovascular;  Laterality: N/A;  . TRANSTHORACIC ECHOCARDIOGRAM  05/27/2008   EF 30-35%  . US ECHOCARDIOGRAPHY  08/22/2008  EF 30-35%       Home Medications    Prior to Admission medications   Medication Sig Start Date End Date Taking? Authorizing Provider  acetaminophen (TYLENOL) 325 MG tablet Take 325-650 mg by mouth every 6 (six) hours as needed (for pain). Reported on 10/11/2015   Yes Historical Provider, MD  albuterol (PROAIR HFA) 108 (90 BASE) MCG/ACT inhaler Inhale 2 puffs into the lungs every 6 (six) hours as needed for wheezing or shortness of breath. Reported on 02/06/2016   Yes Historical Provider, MD  allopurinol (ZYLOPRIM) 300 MG tablet Take 300 mg by mouth daily.   Yes Historical  Provider, MD  atorvastatin (LIPITOR) 40 MG tablet Take 1 tablet (40 mg total) by mouth daily. 07/14/14  Yes Bevelyn Buckles [name], MD  bisoprolol (ZEBETA) 5 MG tablet Take 1 tablet (5 mg total) by mouth daily. 05/12/16  Yes [name], MD  colchicine 0.6 MG tablet Take 0.6 mg by mouth daily as needed (for gout flares). Reported on 02/06/2016   Yes Historical Provider, MD  digoxin (LANOXIN) 0.125 MG tablet Take 0.5 tablets (0.0625 mg total) by mouth every other day. 01/24/16  Yes Bevelyn Buckles [name], MD  Fluticasone-Salmeterol (ADVAIR DISKUS) 250-50 MCG/DOSE AEPB Inhale 1 puff into the lungs every 12 (twelve) hours as needed (shortness of breath). Reported on 02/06/2016   Yes Historical Provider, MD  hydrALAZINE (APRESOLINE) 50 MG tablet Take 1 tablet (50 mg total) by mouth 3 (three) times daily. Patient taking differently: Take 50 mg by mouth 2 (two) times daily.  08/26/16  Yes Graciella Freer, [name]  insulin detemir (LEVEMIR) 100 UNIT/ML injection Inject 36 Units into the skin at bedtime.  03/13/14  Yes Aundria Rud, NP  isosorbide mononitrate (IMDUR) 30 MG 24 hr tablet TAKE 1 TABLET (30 MG TOTAL) BY MOUTH 2 (TWO) TIMES DAILY. 12/06/15  Yes Amy D Clegg, NP  linagliptin (TRADJENTA) 5 MG TABS tablet Take 1 tablet (5 mg total) by mouth daily. 03/13/14  Yes Aundria Rud, NP  methimazole (TAPAZOLE) 10 MG tablet Take 10 mg by mouth daily.    Yes Historical Provider, MD  metolazone (ZAROXOLYN) 2.5 MG tablet Take 2.5 mg by mouth daily as needed (edema). Reported on 02/06/2016   Yes Historical Provider, MD  montelukast (SINGULAIR) 10 MG tablet Take 10 mg by mouth daily as needed (for shortness of breath or wheezing).    Yes Historical Provider, MD  rivaroxaban (XARELTO) 20 MG TABS tablet Take 1 tablet (20 mg total) by mouth daily with supper. 06/24/16  Yes Mariam Dollar Tillery, [name]  sacubitril-valsartan (ENTRESTO) 49-51 MG Take 1 tablet by mouth 2 (two) times daily. 09/03/16  Yes Graciella Freer, [name]    spironolactone (ALDACTONE) 25 MG tablet Take 1/2 tablet by mouth once a day 10/28/15  Yes Historical Provider, MD  torsemide (DEMADEX) 20 MG tablet Take 40 mg (2 tabs) in am and 20 mg (1 tab) in pm Patient taking differently: Take 20-40 mg by mouth See admin instructions. 40 mg in the morning and 20 mg in the evening 10/07/16  Yes Amy D Clegg, NP    Family History Family History  Problem Relation Age of Onset  . Cancer Father   . Diabetes      grandparents  . Hypertension Mother   . CAD      No family history    Social History Social History  Substance Use Topics  . Smoking status: Never Smoker  . Smokeless tobacco: Never Used  .  Alcohol use Yes     Comment: former heavy ETOH, quit in "the late '90s"     Allergies   Patient has no known allergies.   Review of Systems Review of Systems  Constitutional: Negative for chills and fever.  HENT: Negative for ear pain and sore throat.   Eyes: Negative for pain and visual disturbance.  Respiratory: Negative for cough and shortness of breath.   Cardiovascular: Negative for chest pain and palpitations.  Gastrointestinal: Negative for abdominal pain and vomiting.  Genitourinary: Negative for dysuria and hematuria.  Musculoskeletal: Negative for arthralgias and back pain.  Skin: Negative for color change and rash.  Neurological: Positive for dizziness and light-headedness. Negative for seizures and syncope.  All other systems reviewed and are negative.    Physical Exam Updated Vital Signs BP (!) 106/54 (BP Location: Right Arm)   Pulse 68   Temp 98.3 F (36.8 C) (Oral)   Resp 16   Ht 5\' 5"  (1.651 m)   Wt 131.5 kg   SpO2 100%   BMI 48.24 kg/m   Physical Exam  Constitutional: He is oriented to person, place, and time. He appears well-developed and well-nourished.  HENT:  Head: Normocephalic and atraumatic.  Eyes: Conjunctivae are normal.  Neck: Neck supple.  Cardiovascular: Regular rhythm and intact distal pulses.    No murmur heard. Tachycardic  Pulmonary/Chest: Effort normal and breath sounds normal. No respiratory distress.  Abdominal: Soft. There is no tenderness.  Musculoskeletal: He exhibits no edema, tenderness or deformity.  Neurological: He is alert and oriented to person, place, and time.  Skin: Skin is warm and dry.  Psychiatric: He has a normal mood and affect.  Nursing note and vitals reviewed.    ED Treatments / Results  Labs (all labs ordered are listed, but only abnormal results are displayed) Labs Reviewed  COMPREHENSIVE METABOLIC PANEL - Abnormal; Notable for the following:       Result Value   Chloride 100 (*)    Glucose, Bld 183 (*)    BUN 48 (*)    Creatinine, Ser 2.32 (*)    Calcium 8.6 (*)    Albumin 2.6 (*)    Alkaline Phosphatase 175 (*)    GFR calc non Af Amer 30 (*)    GFR calc Af Amer 35 (*)    All other components within normal limits  CBC WITH DIFFERENTIAL/PLATELET - Abnormal; Notable for the following:    WBC 13.7 (*)    RBC 4.12 (*)    Hemoglobin 11.4 (*)    HCT 34.3 (*)    Neutro Abs 11.0 (*)    All other components within normal limits  PROTIME-INR - Abnormal; Notable for the following:    Prothrombin Time 26.9 (*)    All other components within normal limits  INFLUENZA PANEL BY PCR (TYPE A & B) - Abnormal; Notable for the following:    Influenza A By PCR POSITIVE (*)    Influenza B By PCR POSITIVE (*)    All other components within normal limits  GLUCOSE, CAPILLARY - Abnormal; Notable for the following:    Glucose-Capillary 123 (*)    All other components within normal limits  CBC - Abnormal; Notable for the following:    RBC 3.51 (*)    Hemoglobin 9.9 (*)    HCT 29.7 (*)    All other components within normal limits  BASIC METABOLIC PANEL - Abnormal; Notable for the following:    CO2 21 (*)    Glucose,  Bld 118 (*)    BUN 44 (*)    Creatinine, Ser 1.97 (*)    Calcium 8.1 (*)    GFR calc non Af Amer 36 (*)    GFR calc Af Amer 42 (*)     All other components within normal limits  TROPONIN I - Abnormal; Notable for the following:    Troponin I 0.11 (*)    All other components within normal limits  TROPONIN I - Abnormal; Notable for the following:    Troponin I 0.07 (*)    All other components within normal limits  GLUCOSE, CAPILLARY - Abnormal; Notable for the following:    Glucose-Capillary 111 (*)    All other components within normal limits  CULTURE, BLOOD (ROUTINE X 2)  CULTURE, BLOOD (ROUTINE X 2)  URINALYSIS, ROUTINE W REFLEX MICROSCOPIC  PROCALCITONIN  TSH  TROPONIN I  MAGNESIUM  I-STAT CG4 LACTIC ACID, ED    EKG  EKG Interpretation  Date/Time:  Tuesday November 25 2016 18:30:41 EST Ventricular Rate:  126 PR Interval:    QRS Duration: 134 QT Interval:  360 QTC Calculation: 521 R Axis:   -99 Text Interpretation:  Ventricular-paced rhythm with occasional Premature ventricular complexes Biventricular pacemaker detected Abnormal ECG faster than last ekg Reconfirmed by Kandis Mannan ([id number]) on 11/25/2016 6:56:07 PM       Radiology Dg Chest 2 View  Result Date: 11/25/2016 CLINICAL DATA:  Dizziness.  ICD activation today. EXAM: CHEST  2 VIEW COMPARISON:  11/17/2015 FINDINGS: Cardiac pacemaker leads in stable position. Cardiomediastinal silhouette is enlarged. Mediastinal contours appear intact. There is no evidence of focal airspace consolidation, pleural effusion or pneumothorax. Pulmonary vascular congestion. Osseous structures are without acute abnormality. Soft tissues are grossly normal. IMPRESSION: Enlarged cardiac silhouette. Pulmonary vascular congestion. Stable positioning of ICD leads. Electronically Signed   By: Ted Mcalpine M.D.   On: 11/25/2016 20:21    Procedures Procedures (including critical care time)  Medications Ordered in ED Medications  methimazole (TAPAZOLE) tablet 10 mg (not administered)  rivaroxaban (XARELTO) tablet 20 mg (20 mg Oral Given 11/26/16 0050)  digoxin  (LANOXIN) tablet 0.0625 mg (not administered)  allopurinol (ZYLOPRIM) tablet 300 mg (not administered)  atorvastatin (LIPITOR) tablet 40 mg (not administered)  insulin detemir (LEVEMIR) injection 36 Units (36 Units Subcutaneous Given 11/26/16 0123)  sodium chloride flush (NS) 0.9 % injection 3 mL (3 mLs Intravenous Given 11/26/16 0050)  0.9 %  sodium chloride infusion ( Intravenous New Bag/Given 11/26/16 0049)  acetaminophen (TYLENOL) tablet 650 mg (not administered)    Or  acetaminophen (TYLENOL) suppository 650 mg (not administered)  ondansetron (ZOFRAN) tablet 4 mg (not administered)    Or  ondansetron (ZOFRAN) injection 4 mg (not administered)  insulin aspart (novoLOG) injection 0-15 Units (0 Units Subcutaneous Not Given 11/26/16 0837)  oseltamivir (TAMIFLU) capsule 30 mg (not administered)  potassium chloride SA (K-DUR,KLOR-CON) CR tablet 40 mEq (not administered)  acetaminophen (TYLENOL) tablet 650 mg (650 mg Oral Given 11/25/16 1838)  piperacillin-tazobactam (ZOSYN) IVPB 3.375 g (0 g Intravenous Stopped 11/25/16 2156)  vancomycin (VANCOCIN) 2,500 mg in sodium chloride 0.9 % 500 mL IVPB (2,500 mg Intravenous Transfusing/Transfer 11/25/16 2244)  sodium chloride 0.9 % bolus 3,000 mL (3,000 mLs Intravenous Transfusing/Transfer 11/25/16 2244)     Initial Impression / Assessment and Plan / ED Course  I have reviewed the triage vital signs and the nursing notes.  Pertinent labs & imaging results that were available during my care of the patient were reviewed by me  and considered in my medical decision making (see chart for details).    Pt with hx as above presents due to near syncopal episode earlier today and possible AICD firing. In triage, pt noted to be febrile and hypotensive. He denies any infectious symptoms otherwise. He was able to ambulate without difficulty. Sepsis workup initiated. AICD interrogated and showed it did defibrillate earlier today for what was reported as rapid a-fib. His  CXR shows no evidence of pneumonia. WBC is elevated and he has an AKI. I spoke to cards fellow who stated this was likely an inappropriate firing in setting of his sepsis, but their recommendation would be to treat underlying sepsis. Pt's BP was fluid responsive in Ed. Abx given. Pt admitted to hospitalist for further workup and treatment.  Final Clinical Impressions(s) / ED Diagnoses   Final diagnoses:  Sepsis, due to unspecified organism [facility])  AICD discharge    New Prescriptions Current Discharge Medication List       Lennette Bihari, MD 11/26/16 1114    Courteney Randall An, MD 11/27/16 1337

## 2016-11-26 ENCOUNTER — Observation Stay (HOSPITAL_COMMUNITY): Payer: Medicare Other

## 2016-11-26 DIAGNOSIS — Z794 Long term (current) use of insulin: Secondary | ICD-10-CM

## 2016-11-26 DIAGNOSIS — I5022 Chronic systolic (congestive) heart failure: Secondary | ICD-10-CM | POA: Diagnosis not present

## 2016-11-26 DIAGNOSIS — E1165 Type 2 diabetes mellitus with hyperglycemia: Secondary | ICD-10-CM

## 2016-11-26 DIAGNOSIS — I480 Paroxysmal atrial fibrillation: Secondary | ICD-10-CM

## 2016-11-26 DIAGNOSIS — J111 Influenza due to unidentified influenza virus with other respiratory manifestations: Secondary | ICD-10-CM

## 2016-11-26 DIAGNOSIS — G4733 Obstructive sleep apnea (adult) (pediatric): Secondary | ICD-10-CM | POA: Diagnosis not present

## 2016-11-26 DIAGNOSIS — N183 Chronic kidney disease, stage 3 (moderate): Secondary | ICD-10-CM | POA: Diagnosis not present

## 2016-11-26 DIAGNOSIS — I4892 Unspecified atrial flutter: Secondary | ICD-10-CM | POA: Diagnosis not present

## 2016-11-26 DIAGNOSIS — Z95810 Presence of automatic (implantable) cardiac defibrillator: Secondary | ICD-10-CM | POA: Diagnosis not present

## 2016-11-26 DIAGNOSIS — A419 Sepsis, unspecified organism: Secondary | ICD-10-CM | POA: Diagnosis not present

## 2016-11-26 LAB — URINALYSIS, ROUTINE W REFLEX MICROSCOPIC
BILIRUBIN URINE: NEGATIVE
GLUCOSE, UA: NEGATIVE mg/dL
HGB URINE DIPSTICK: NEGATIVE
KETONES UR: NEGATIVE mg/dL
Leukocytes, UA: NEGATIVE
Nitrite: NEGATIVE
PROTEIN: NEGATIVE mg/dL
Specific Gravity, Urine: 1.01 (ref 1.005–1.030)
pH: 5 (ref 5.0–8.0)

## 2016-11-26 LAB — GLUCOSE, CAPILLARY
GLUCOSE-CAPILLARY: 134 mg/dL — AB (ref 65–99)
Glucose-Capillary: 111 mg/dL — ABNORMAL HIGH (ref 65–99)
Glucose-Capillary: 105 mg/dL — ABNORMAL HIGH (ref 65–99)
Glucose-Capillary: 110 mg/dL — ABNORMAL HIGH (ref 65–99)

## 2016-11-26 LAB — CBC
HCT: 29.7 % — ABNORMAL LOW (ref 39.0–52.0)
HEMOGLOBIN: 9.9 g/dL — AB (ref 13.0–17.0)
MCH: 28.2 pg (ref 26.0–34.0)
MCHC: 33.3 g/dL (ref 30.0–36.0)
MCV: 84.6 fL (ref 78.0–100.0)
PLATELETS: 209 10*3/uL (ref 150–400)
RBC: 3.51 MIL/uL — AB (ref 4.22–5.81)
RDW: 14.8 % (ref 11.5–15.5)
WBC: 10.4 10*3/uL (ref 4.0–10.5)

## 2016-11-26 LAB — BASIC METABOLIC PANEL
ANION GAP: 12 (ref 5–15)
BUN: 44 mg/dL — ABNORMAL HIGH (ref 6–20)
CALCIUM: 8.1 mg/dL — AB (ref 8.9–10.3)
CO2: 21 mmol/L — AB (ref 22–32)
CREATININE: 1.97 mg/dL — AB (ref 0.61–1.24)
Chloride: 105 mmol/L (ref 101–111)
GFR calc non Af Amer: 36 mL/min — ABNORMAL LOW (ref >60)
GFR, EST AFRICAN AMERICAN: 42 mL/min — AB (ref >60)
GLUCOSE: 118 mg/dL — AB (ref 65–99)
Potassium: 3.5 mmol/L (ref 3.5–5.1)
SODIUM: 138 mmol/L (ref 135–145)

## 2016-11-26 LAB — TROPONIN I
TROPONIN I: 0.05 ng/mL — AB (ref <0.03)
Troponin I: 0.11 ng/mL (ref <0.03)
Troponin I: 0.07 ng/mL (ref <0.03)

## 2016-11-26 LAB — TSH: TSH: 1.041 u[IU]/mL (ref 0.350–4.500)

## 2016-11-26 LAB — MAGNESIUM: MAGNESIUM: 2.1 mg/dL (ref 1.7–2.4)

## 2016-11-26 LAB — INFLUENZA PANEL BY PCR (TYPE A & B)
Influenza A By PCR: POSITIVE — AB
Influenza B By PCR: POSITIVE — AB

## 2016-11-26 LAB — PROCALCITONIN: PROCALCITONIN: 0.36 ng/mL

## 2016-11-26 MED ORDER — SODIUM CHLORIDE 0.9 % IV SOLN
INTRAVENOUS | Status: AC
  Administered 2016-11-26: 01:00:00 via INTRAVENOUS

## 2016-11-26 MED ORDER — ACETAMINOPHEN 650 MG RE SUPP: 650.0000 mg | Freq: Four times a day (QID) | RECTAL | Status: DC | PRN

## 2016-11-26 MED ORDER — SODIUM CHLORIDE 0.9% FLUSH
3.0000 mL | Freq: Two times a day (BID) | INTRAVENOUS | Status: DC
  Administered 2016-11-26 – 2016-11-27 (×4): 3 mL via INTRAVENOUS

## 2016-11-26 MED ORDER — ATORVASTATIN CALCIUM 40 MG PO TABS
40.0000 mg | ORAL_TABLET | Freq: Every day | ORAL | Status: DC
  Administered 2016-11-26 – 2016-11-27 (×2): 40 mg via ORAL
  Filled 2016-11-26 (×2): qty 1

## 2016-11-26 MED ORDER — SPIRONOLACTONE 25 MG PO TABS: 12.5000 mg | ORAL_TABLET | Freq: Every day | ORAL | Status: DC

## 2016-11-26 MED ORDER — INSULIN ASPART 100 UNIT/ML ~~LOC~~ SOLN
0.0000 [IU] | Freq: Three times a day (TID) | SUBCUTANEOUS | Status: DC
  Administered 2016-11-27: 2 [IU] via SUBCUTANEOUS

## 2016-11-26 MED ORDER — TORSEMIDE 20 MG PO TABS: 40.0000 mg | ORAL_TABLET | Freq: Every day | ORAL | Status: DC

## 2016-11-26 MED ORDER — POTASSIUM CHLORIDE CRYS ER 20 MEQ PO TBCR
40.0000 meq | EXTENDED_RELEASE_TABLET | Freq: Once | ORAL | Status: DC
  Filled 2016-11-26: qty 2

## 2016-11-26 MED ORDER — OSELTAMIVIR PHOSPHATE 30 MG PO CAPS
30.0000 mg | ORAL_CAPSULE | Freq: Two times a day (BID) | ORAL | Status: DC
  Administered 2016-11-26 – 2016-11-27 (×3): 30 mg via ORAL
  Filled 2016-11-26 (×3): qty 1

## 2016-11-26 MED ORDER — INSULIN DETEMIR 100 UNIT/ML ~~LOC~~ SOLN
36.0000 [IU] | Freq: Every day | SUBCUTANEOUS | Status: DC
  Administered 2016-11-26 – 2016-11-27 (×3): 36 [IU] via SUBCUTANEOUS
  Filled 2016-11-26 (×3): qty 0.36

## 2016-11-26 MED ORDER — ONDANSETRON HCL 4 MG PO TABS: 4.0000 mg | ORAL_TABLET | Freq: Four times a day (QID) | ORAL | Status: DC | PRN

## 2016-11-26 MED ORDER — METHIMAZOLE 10 MG PO TABS
10.0000 mg | ORAL_TABLET | Freq: Every day | ORAL | Status: DC
  Administered 2016-11-26 – 2016-11-27 (×2): 10 mg via ORAL
  Filled 2016-11-26 (×2): qty 1

## 2016-11-26 MED ORDER — RIVAROXABAN 20 MG PO TABS
20.0000 mg | ORAL_TABLET | Freq: Every day | ORAL | Status: DC
  Administered 2016-11-26 – 2016-11-27 (×3): 20 mg via ORAL
  Filled 2016-11-26 (×3): qty 1

## 2016-11-26 MED ORDER — ACETAMINOPHEN 325 MG PO TABS: 650.0000 mg | ORAL_TABLET | Freq: Four times a day (QID) | ORAL | Status: DC | PRN

## 2016-11-26 MED ORDER — ONDANSETRON HCL 4 MG/2ML IJ SOLN: 4.0000 mg | Freq: Four times a day (QID) | INTRAMUSCULAR | Status: DC | PRN

## 2016-11-26 MED ORDER — ALLOPURINOL 300 MG PO TABS
300.0000 mg | ORAL_TABLET | Freq: Every day | ORAL | Status: DC
  Administered 2016-11-26 – 2016-11-27 (×2): 300 mg via ORAL
  Filled 2016-11-26 (×2): qty 1

## 2016-11-26 MED ORDER — DIGOXIN 125 MCG PO TABS
0.0625 mg | ORAL_TABLET | ORAL | Status: DC
  Administered 2016-11-27: 0.0625 mg via ORAL
  Filled 2016-11-26: qty 1

## 2016-11-26 MED ORDER — TORSEMIDE 20 MG PO TABS: 20.0000 mg | ORAL_TABLET | Freq: Every day | ORAL | Status: DC

## 2016-11-26 NOTE — Care Management Obs Status (Signed)
MEDICARE OBSERVATION STATUS NOTIFICATION   Patient Details  Name: Gary Hutchinson MRN: [id number] Date of Birth: Nov 22, 1960   Medicare Observation Status Notification Given:  Yes    Gala Lewandowsky, RN 11/26/2016, 5:11 PM

## 2016-11-26 NOTE — Progress Notes (Signed)
PROGRESS NOTE    Gary Hutchinson  XYV:[id number] DOB: [birth year] DOA: 11/25/2016 PCP: [name], MD   Outpatient Specialists:     Brief Narrative:  Gary Hutchinson is a 56 y.o. gentleman with a history of Nonischemic cardiomyopathy (EF 25-30%), paroxysmal atrial fibrillation (CHADS-Vasc score of 3, anticoagulated with Xarelto), ventricular tachycardia S/P Bi-V AICD, HTN, HLD, DM, and OSA on CPAP qHS who presented to the ED for evaluation after two days of not feeling well with light-headedness and dizziness (but no syncope).  Today, he felt a "pop" in his chest and though his ICD fired.  No significant chest pain with this.  He has had chills but no fever documented until he arrived in the ED.  No cough, cold, or congestion.  No known sick contacts.  No recent procedures.  No recent medication changes.    Assessment & Plan:   Principal Problem:   Sepsis (HCC) Active Problems:   Insulin dependent type 2 diabetes mellitus, uncontrolled (HCC)   Morbid obesity (HCC)   Obstructive sleep apnea   Automatic implantable cardioverter-defibrillator in situ   Atrial fibrillation (HCC)   Chronic systolic heart failure (HCC)   Flu A/B-- only symptoms were chills -added tamiflu -droplet precautions -d/c zosyn/vanc  Firing of AICD -cardiology consult- suspect troponin elevation related to this -check Mg -replace K to 4  DM -SSI -levemir  OSA -CPAP  AKI -IVF -improving  Morbid obesity -encouraged weight loss  A fib -digoxin -xarelto Tele showed paced  Hyperthyroidism -tapazole -TSH normal this admission   DVT prophylaxis:  Fully anticoagulated   Code Status: Full Code   Family Communication: patient  Disposition Plan:     Consultants:   cards     Subjective: Never had fever, just chills  Objective: Vitals:   11/25/16 2215 11/25/16 2244 11/25/16 2313 11/26/16 0436  BP: 98/59 (!) 120/52 99/71 (!) 106/54  Pulse: 84  81 68  Resp:    16    Temp:   98.8 F (37.1 C) 98.3 F (36.8 C)  TempSrc:   Oral Oral  SpO2: 98%  100% 100%  Weight:   131.5 kg (289 lb 14.4 oz)   Height:   5\' 5"  (1.651 m)     Intake/Output Summary (Last 24 hours) at 11/26/16 1005 Last data filed at 11/26/16 0436  Gross per 24 hour  Intake           318.33 ml  Output              500 ml  Net          -181.67 ml   Filed Weights   11/25/16 1832 11/25/16 2313  Weight: 122.5 kg (270 lb) 131.5 kg (289 lb 14.4 oz)    Examination:  General exam: Appears calm and comfortable - obese Respiratory system: diminished, no wheezing. Cardiovascular system: S1 & S2 heard, RRR. No JVD, murmurs, rubs, gallops or clicks. No pedal edema. Gastrointestinal system: Abdomen is nondistended, soft and nontender. No organomegaly or masses felt. Normal bowel sounds heard. Central nervous system: Alert and oriented. No focal neurological deficits.     Data Reviewed: I have personally reviewed following labs and imaging studies  CBC:  Recent Labs Lab 11/25/16 1848 11/26/16 0637  WBC 13.7* 10.4  NEUTROABS 11.0*  --   HGB 11.4* 9.9*  HCT 34.3* 29.7*  MCV 83.3 84.6  PLT 262 209   Basic Metabolic Panel:  Recent Labs Lab 11/25/16 1848 11/26/16 0637  NA 135 138  K 3.5 3.5  CL 100* 105  CO2 22 21*  GLUCOSE 183* 118*  BUN 48* 44*  CREATININE 2.32* 1.97*  CALCIUM 8.6* 8.1*   GFR: Estimated Creatinine Clearance: 53.6 mL/min (by C-G formula based on SCr of 1.97 mg/dL (H)). Liver Function Tests:  Recent Labs Lab 11/25/16 1848  AST 30  ALT 23  ALKPHOS 175*  BILITOT 0.9  PROT 7.1  ALBUMIN 2.6*   No results for input(s): LIPASE, AMYLASE in the last 168 hours. No results for input(s): AMMONIA in the last 168 hours. Coagulation Profile:  Recent Labs Lab 11/25/16 1848  INR 2.44   Cardiac Enzymes:  Recent Labs Lab 11/26/16 0032 11/26/16 0637  TROPONINI 0.11* 0.07*   BNP (last 3 results) No results for input(s): PROBNP in the last 8760  hours. HbA1C: No results for input(s): HGBA1C in the last 72 hours. CBG:  Recent Labs Lab 11/25/16 2308 11/26/16 0717  GLUCAP 123* 111*   Lipid Profile: No results for input(s): CHOL, HDL, LDLCALC, TRIG, CHOLHDL, LDLDIRECT in the last 72 hours. Thyroid Function Tests:  Recent Labs  11/26/16 0032  TSH 1.041   Anemia Panel: No results for input(s): VITAMINB12, FOLATE, FERRITIN, TIBC, IRON, RETICCTPCT in the last 72 hours. Urine analysis:    Component Value Date/Time   COLORURINE YELLOW 11/26/2016 0014   APPEARANCEUR CLEAR 11/26/2016 0014   LABSPEC 1.010 11/26/2016 0014   PHURINE 5.0 11/26/2016 0014   GLUCOSEU NEGATIVE 11/26/2016 0014   HGBUR NEGATIVE 11/26/2016 0014   BILIRUBINUR NEGATIVE 11/26/2016 0014   KETONESUR NEGATIVE 11/26/2016 0014   PROTEINUR NEGATIVE 11/26/2016 0014   UROBILINOGEN 1.0 04/06/2011 2108   NITRITE NEGATIVE 11/26/2016 0014   LEUKOCYTESUR NEGATIVE 11/26/2016 0014     )No results found for this or any previous visit (from the past 240 hour(s)).    Anti-infectives    Start     Dose/Rate Route Frequency Ordered Stop   11/26/16 2030  vancomycin (VANCOCIN) 1,500 mg in sodium chloride 0.9 % 500 mL IVPB     1,500 mg 250 mL/hr over 120 Minutes Intravenous Every 24 hours 11/25/16 2011     11/26/16 1000  oseltamivir (TAMIFLU) capsule 30 mg     30 mg Oral 2 times daily 11/26/16 0830 12/01/16 0959   11/26/16 0430  piperacillin-tazobactam (ZOSYN) IVPB 3.375 g     3.375 g 12.5 mL/hr over 240 Minutes Intravenous Every 8 hours 11/25/16 2011     11/25/16 2015  piperacillin-tazobactam (ZOSYN) IVPB 3.375 g     3.375 g 100 mL/hr over 30 Minutes Intravenous  Once 11/25/16 2010 11/25/16 2156   11/25/16 2015  vancomycin (VANCOCIN) 2,500 mg in sodium chloride 0.9 % 500 mL IVPB     2,500 mg 250 mL/hr over 120 Minutes Intravenous  Once 11/25/16 2010 11/25/16 2326       Radiology Studies: Dg Chest 2 View  Result Date: 11/25/2016 CLINICAL DATA:  Dizziness.   ICD activation today. EXAM: CHEST  2 VIEW COMPARISON:  11/17/2015 FINDINGS: Cardiac pacemaker leads in stable position. Cardiomediastinal silhouette is enlarged. Mediastinal contours appear intact. There is no evidence of focal airspace consolidation, pleural effusion or pneumothorax. Pulmonary vascular congestion. Osseous structures are without acute abnormality. Soft tissues are grossly normal. IMPRESSION: Enlarged cardiac silhouette. Pulmonary vascular congestion. Stable positioning of ICD leads. Electronically Signed   By: Ted Mcalpine M.D.   On: 11/25/2016 20:21        Scheduled Meds: . allopurinol  300 mg Oral Daily  . atorvastatin  40 mg Oral q1800  . [START ON 11/27/2016] digoxin  0.0625 mg Oral QODAY  . insulin aspart  0-15 Units Subcutaneous TID WC  . insulin detemir  36 Units Subcutaneous QHS  . methimazole  10 mg Oral Daily  . oseltamivir  30 mg Oral BID  . piperacillin-tazobactam (ZOSYN)  IV  3.375 g Intravenous Q8H  . potassium chloride  40 mEq Oral Once  . rivaroxaban  20 mg Oral Q supper  . sodium chloride flush  3 mL Intravenous Q12H  . vancomycin  1,500 mg Intravenous Q24H   Continuous Infusions: . sodium chloride 100 mL/hr at 11/26/16 0049     LOS: 0 days    Time spent: 25 min    Crimson Beer U Hassel Uphoff, DO [facility] Pager [phone]  If 7PM-7AM, please contact night-coverage [email or web address] Password [email or web address] 11/26/2016, 10:05 AM

## 2016-11-26 NOTE — Progress Notes (Signed)
Midlevel informed of pt's troponin being elevated.

## 2016-11-26 NOTE — Progress Notes (Addendum)
Amion continues to not be working. Pt is asymptomatic and does not seem to be in any distress. Will continue to monitor.

## 2016-11-26 NOTE — Discharge Instructions (Signed)

## 2016-11-26 NOTE — Progress Notes (Signed)
Pt troponin elevated. Amion down. Will attempt to report again. 

## 2016-11-26 NOTE — Consult Note (Signed)
ELECTROPHYSIOLOGY CONSULT NOTE    Patient ID: Gary Hutchinson MRN: [id number], DOB/AGE: 03/08/1961 66 y.o.  Admit date: 11/25/2016 Date of Consult: 11/26/2016   Primary Physician: [name], MD Primary Cardiologist: Dr. Gala Romney Electrophysiologist: Dr. [name] Requesting MD: Dr. Benjamine Mola  Reason for Consultation: ICD shock  HPI: Gary Hutchinson is a 55 y.o. male with PMHx of CM, VT w/ICD, HTN, DM, chronic CHF (systolic), AFib, morbid obesity, CRI (stage III), OSA admitted to [facility] yesterday after feeling like he was shocked by his ICD.  He had c/o chills and noted with temp of 102 in the ER and admitted by medicine for further care/management.  He was initially treated with IVF with BP 86/45 and emperic Zosyn.  The patient for about 2 days had been feeling achy, run down, on/off chills and generally run down, and a little weak, no cough or SOB, the day he came in was just about to take a drink of water and got shocked by his device.  He denies any kind of CP or perception of palpitations just prior to the event or othewise at all.  He does not feel like he had been in AF of late at all.  He felt a little lightheaded perhaps prior to his shock but did not feel near syncopal and has not had syncope.   LABS/data: K+ 3.5 BUN/Creat 48/2.32 > 44/1.97 WBC 13.7 H/H 11.4/34 > 9.9/29.7 plts 262 INR 2.44 TSH 1/041 Trop I: 0.11, 0.07 Influenza A/B both + BC pending, neg x24hrs CXR pulm vascular congestion  Device interrogation:  Onset of AFlutter with RVR initially yesterday >> AFlutter which he has 1:1 conduction with, eventually fast enough into VF zone + ATP during charge that failed followed by one shock, successful in converting to SR BiVe pacing 96.5%   DEVICE information: MDT, CRT-D, implanted 11/16/15, original device 2009  Past Medical History:  Diagnosis Date  . AICD (automatic cardioverter/defibrillator) present   . Alcohol abuse    now quit  . Anemia    iron defi  .  Arthritis    "fingers, knees, some days all my joints ache" (11/16/2015)  . Asthma   . Cholecystitis   . Chronic systolic congestive heart failure (HCC)    a. RHC (02/2011) RA 31/27, RV 71/27, PA67/45, PCWP 46, PA 49%, Fick CO: 3.4 b. ECHO (03/2014) EF 30-35%, grade II DD, mild MR, RV poorly visualized appears mildly decreased c. RHC (05/2014) RA 13, RV 54/4/11, PA 60/22 (37), PCWP 17, Fick CO/CI: 4.4 / 1.9, Thermo CO/CI: 3.8 /1.6, PVR 5.2 WU, PA 57% and 59%  . Diverticulosis of colon   . Gout   . Hemorrhoids, internal   . Hyperlipidemia   . Hypertension   . Morbid obesity (HCC)   . Nonischemic cardiomyopathy (HCC)    a. LHC (02/2011) normal coronaries  . OSA on CPAP   . Paroxysmal atrial fibrillation (HCC)   . Pneumonia 2000s X 1  . Pulmonary hypertension   . Renal insufficiency   . Type II diabetes mellitus (HCC)   . Ventricular tachycardia [facility])    s/p MDT ICD implant     Surgical History:  Past Surgical History:  Procedure Laterality Date  . CARDIAC CATHETERIZATION  03/08/2004   EF 25-30%  . CARDIOVERSION N/A 10/17/2015   Procedure: CARDIOVERSION;  Surgeon: [name], MD;  Location: [facility] ENDOSCOPY;  Service: Cardiovascular;  Laterality: N/A;  . EP IMPLANTABLE DEVICE N/A 11/16/2015   Procedure: BiVI Upgrade;  Surgeon: Fayrene Fearing  Allred, MD;  [employer] [address] [place]  . INSERTION OF ICD  06/2008   ICD- [employer]   . RIGHT HEART CATHETERIZATION N/A 05/23/2014   Procedure: RIGHT HEART CATH;  Surgeon: [name], MD;  Location: [facility] CATH LAB;  Service: Cardiovascular;  Laterality: N/A;  . TRANSTHORACIC ECHOCARDIOGRAM  05/27/2008   EF 30-35%  . US ECHOCARDIOGRAPHY  08/22/2008   EF 30-35%     Prescriptions Prior to Admission  Medication Sig Dispense Refill Last Dose  . acetaminophen (TYLENOL) 325 MG tablet Take 325-650 mg by mouth every 6 (six) hours as needed (for pain). Reported on 10/11/2015   PRN at PRN  . albuterol (PROAIR HFA) 108 (90 BASE) MCG/ACT inhaler Inhale 2 puffs into  the lungs every 6 (six) hours as needed for wheezing or shortness of breath. Reported on 02/06/2016   PRN at PRN  . allopurinol (ZYLOPRIM) 300 MG tablet Take 300 mg by mouth daily.   11/24/2016 at am  . atorvastatin (LIPITOR) 40 MG tablet Take 1 tablet (40 mg total) by mouth daily. 90 tablet 6 11/24/2016 at pm  . bisoprolol (ZEBETA) 5 MG tablet Take 1 tablet (5 mg total) by mouth daily. 30 tablet 5 11/24/2016 at Unknown time  . colchicine 0.6 MG tablet Take 0.6 mg by mouth daily as needed (for gout flares). Reported on 02/06/2016   PRN at PRN  . digoxin (LANOXIN) 0.125 MG tablet Take 0.5 tablets (0.0625 mg total) by mouth every other day. 45 tablet 3 11/25/2016 at am  . Fluticasone-Salmeterol (ADVAIR DISKUS) 250-50 MCG/DOSE AEPB Inhale 1 puff into the lungs every 12 (twelve) hours as needed (shortness of breath). Reported on 02/06/2016   PRN at PRN  . hydrALAZINE (APRESOLINE) 50 MG tablet Take 1 tablet (50 mg total) by mouth 3 (three) times daily. (Patient taking differently: Take 50 mg by mouth 2 (two) times daily. ) 270 tablet 6 11/25/2016 at am  . insulin detemir (LEVEMIR) 100 UNIT/ML injection Inject 36 Units into the skin at bedtime.    11/24/2016 at pm  . isosorbide mononitrate (IMDUR) 30 MG 24 hr tablet TAKE 1 TABLET (30 MG TOTAL) BY MOUTH 2 (TWO) TIMES DAILY. 180 tablet 3 11/25/2016 at am  . linagliptin (TRADJENTA) 5 MG TABS tablet Take 1 tablet (5 mg total) by mouth daily. 30 tablet 3 11/24/2016 at pm  . methimazole (TAPAZOLE) 10 MG tablet Take 10 mg by mouth daily.    11/24/2016 at Unknown time  . metolazone (ZAROXOLYN) 2.5 MG tablet Take 2.5 mg by mouth daily as needed (edema). Reported on 02/06/2016   PRN at PRN  . montelukast (SINGULAIR) 10 MG tablet Take 10 mg by mouth daily as needed (for shortness of breath or wheezing).    PRN at PRN  . rivaroxaban (XARELTO) 20 MG TABS tablet Take 1 tablet (20 mg total) by mouth daily with supper. 30 tablet 11 11/24/2016 at 1900  . sacubitril-valsartan (ENTRESTO) 49-51  MG Take 1 tablet by mouth 2 (two) times daily. 60 tablet 3 11/25/2016 at am  . spironolactone (ALDACTONE) 25 MG tablet Take 1/2 tablet by mouth once a day   11/25/2016 at am  . torsemide (DEMADEX) 20 MG tablet Take 40 mg (2 tabs) in am and 20 mg (1 tab) in pm (Patient taking differently: Take 20-40 mg by mouth See admin instructions. 40 mg in the morning and 20 mg in the evening) 270 tablet 3 11/25/2016 at 1500    Inpatient Medications:  . allopurinol  300  mg Oral Daily  . atorvastatin  40 mg Oral q1800  . [START ON 11/27/2016] digoxin  0.0625 mg Oral QODAY  . insulin aspart  0-15 Units Subcutaneous TID WC  . insulin detemir  36 Units Subcutaneous QHS  . methimazole  10 mg Oral Daily  . oseltamivir  30 mg Oral BID  . potassium chloride  40 mEq Oral Once  . rivaroxaban  20 mg Oral Q supper  . sodium chloride flush  3 mL Intravenous Q12H    Allergies: No Known Allergies  Social History   Social History  . Marital status: Single    Spouse name: N/A  . Number of children: 4  . Years of education: N/A   Occupational History  .  Disabled   Social History Main Topics  . Smoking status: Never Smoker  . Smokeless tobacco: Never Used  . Alcohol use Yes     Comment: former heavy ETOH, quit in "the late '90s"  . Drug use: No  . Sexual activity: Not Currently   Other Topics Concern  . Not on file   Social History Narrative   disabled     Family History  Problem Relation Age of Onset  . Cancer Father   . Diabetes      grandparents  . Hypertension Mother   . CAD      No family history     Review of Systems: All other systems reviewed and are otherwise negative except as noted above.  Physical Exam: Vitals:   11/25/16 2215 11/25/16 2244 11/25/16 2313 11/26/16 0436  BP: 98/59 (!) 120/52 99/71 (!) 106/54  Pulse: 84  81 68  Resp:    16  Temp:   98.8 F (37.1 C) 98.3 F (36.8 C)  TempSrc:   Oral Oral  SpO2: 98%  100% 100%  Weight:   289 lb 14.4 oz (131.5 kg)   Height:    5\' 5"  (1.651 m)     GEN- The patient is well appearing, alert and oriented x 3 today.   HEENT: normocephalic, atraumatic; sclera clear, conjunctiva pink; hearing intact; oropharynx clear; neck supple, no JVP Lymph- no cervical lymphadenopathy Lungs- minimally diminished at the bases, normal work of breathing.  No wheezes, rales, rhonchi Heart- Regular rate and rhythm, 1/6 SM, rubs or gallops, PMI not laterally displaced GI- soft, non-tender, non-distended, bowel sounds present Extremities- no clubbing, cyanosis, no edema is appreciated MS- no significant deformity or atrophy Skin- warm and dry, no rash or lesion Psych- euthymic mood, full affect Neuro- no gross deficits observed  Labs:   Lab Results  Component Value Date   WBC 10.4 11/26/2016   HGB 9.9 (L) 11/26/2016   HCT 29.7 (L) 11/26/2016   MCV 84.6 11/26/2016   PLT 209 11/26/2016    Recent Labs Lab 11/25/16 1848 11/26/16 0637  NA 135 138  K 3.5 3.5  CL 100* 105  CO2 22 21*  BUN 48* 44*  CREATININE 2.32* 1.97*  CALCIUM 8.6* 8.1*  PROT 7.1  --   BILITOT 0.9  --   ALKPHOS 175*  --   ALT 23  --   AST 30  --   GLUCOSE 183* 118*      Radiology/Studies:  Dg Chest 2 View Result Date: 11/25/2016 CLINICAL DATA:  Dizziness.  ICD activation today. EXAM: CHEST  2 VIEW COMPARISON:  11/17/2015 FINDINGS: Cardiac pacemaker leads in stable position. Cardiomediastinal silhouette is enlarged. Mediastinal contours appear intact. There is no evidence of focal airspace  consolidation, pleural effusion or pneumothorax. Pulmonary vascular congestion. Osseous structures are without acute abnormality. Soft tissues are grossly normal. IMPRESSION: Enlarged cardiac silhouette. Pulmonary vascular congestion. Stable positioning of ICD leads. Electronically Signed   By: Ted Mcalpine M.D.   On: 11/25/2016 20:21    EKG: SR, V paced, PVCs TELEMETRY: SR, PAC's, at times is bigemenal, currenlty is SR with V paced rhythm  06/03/16: TTE Study  Conclusions - Left ventricle: The cavity size was severely dilated. Wall   thickness was normal. Systolic function was severely reduced. The   estimated ejection fraction was in the range of 25% to 30%.   Diffuse hypokinesis. Doppler parameters are consistent with   restrictive physiology, indicative of decreased left ventricular   diastolic compliance and/or increased left atrial pressure.   Doppler parameters are consistent with high ventricular filling   pressure. - Mitral valve: There was moderate regurgitation. - Left atrium: The atrium was severely dilated. - Right atrium: The atrium was mildly dilated. - Pulmonary arteries: Systolic pressure was mildly increased. PA   peak pressure: 34 mm Hg (S). Impressions: - Definity used; severe global reduction in LV function; severe   LVE; restrictive filling; moderate MR; severe LAE; mild RAE; mild   TR with mildly elevated pulmonary pressure.  LA 61mm  Assessment and Plan:   1.Febrile illness/sepsis    + for influenza A and B    C/w IM service  2.  ICD shock      Secondary to 1:1 A flutter  His device interrogation show a number of AT/AF episodes particularly in August, none since then to now. Will defer to Dr. [name] his primary EP management of his A arrhythmias   3. Abnormal Trop     No c/o CP     Hx reports cath 2012 with normal coronaries  4.  Known PAFib      Historically on amiodarone      This event likely prompted by flu      CHA2DS2Vasc is 3, on Xarelto (Calc Cr clearance is 79)  5. CM, in review of record appears non-ischemic      Does not appear to be overtly overloaded at this time by exam      Follow      I/O negative -   Signed, Francis Dowse, [name] 11/26/2016 11:28 AM  Patient interviewed and examined; defibrillator strips reviewed  6. Renal insufficiency grade 3 the verge of grade 4  The patient presents with an ICD shock which appears to be atrial flutter with 1:1 conduction. He had had  intermittent episodes of same. It is interesting that he had atrial fibrillation that organized to create the atrial flutter. His left atrial dimensions are normal (61/2.4/67) as, I do not think intervascular ablation of his atrial fibrillation substrate is likely going to be helpful. I suggested that AV nodal ablation might be the next best choice. In the interim we will increase his bisoprolol gently. Dr. Erskine Squibb will be in the hospital tomorrow and will defer final decision was to him as he knows him well  Previously had been on amiodarone, however given his young age and left atrial size AV ablation is probably a better choice than antiarrhythmic therapy. His renal function does not make dofetilide and attractive OPTION either

## 2016-11-27 DIAGNOSIS — A419 Sepsis, unspecified organism: Secondary | ICD-10-CM

## 2016-11-27 DIAGNOSIS — Z95810 Presence of automatic (implantable) cardiac defibrillator: Secondary | ICD-10-CM | POA: Diagnosis not present

## 2016-11-27 DIAGNOSIS — I480 Paroxysmal atrial fibrillation: Secondary | ICD-10-CM | POA: Diagnosis not present

## 2016-11-27 DIAGNOSIS — I5022 Chronic systolic (congestive) heart failure: Secondary | ICD-10-CM | POA: Diagnosis not present

## 2016-11-27 DIAGNOSIS — Z794 Long term (current) use of insulin: Secondary | ICD-10-CM | POA: Diagnosis not present

## 2016-11-27 DIAGNOSIS — E1165 Type 2 diabetes mellitus with hyperglycemia: Secondary | ICD-10-CM | POA: Diagnosis not present

## 2016-11-27 LAB — BASIC METABOLIC PANEL
ANION GAP: 9 (ref 5–15)
BUN: 26 mg/dL — ABNORMAL HIGH (ref 6–20)
CHLORIDE: 106 mmol/L (ref 101–111)
CO2: 23 mmol/L (ref 22–32)
Calcium: 8.8 mg/dL — ABNORMAL LOW (ref 8.9–10.3)
Creatinine, Ser: 1.38 mg/dL — ABNORMAL HIGH (ref 0.61–1.24)
GFR calc Af Amer: >60 mL/min (ref >60)
GFR, EST NON AFRICAN AMERICAN: 56 mL/min — AB (ref >60)
Glucose, Bld: 91 mg/dL (ref 65–99)
POTASSIUM: 3.8 mmol/L (ref 3.5–5.1)
SODIUM: 138 mmol/L (ref 135–145)

## 2016-11-27 LAB — CBC
HCT: 30.9 % — ABNORMAL LOW (ref 39.0–52.0)
HEMOGLOBIN: 10.3 g/dL — AB (ref 13.0–17.0)
MCH: 28.2 pg (ref 26.0–34.0)
MCHC: 33.3 g/dL (ref 30.0–36.0)
MCV: 84.7 fL (ref 78.0–100.0)
PLATELETS: 275 10*3/uL (ref 150–400)
RBC: 3.65 MIL/uL — AB (ref 4.22–5.81)
RDW: 14.4 % (ref 11.5–15.5)
WBC: 10.9 10*3/uL — AB (ref 4.0–10.5)

## 2016-11-27 LAB — GLUCOSE, CAPILLARY
GLUCOSE-CAPILLARY: 88 mg/dL (ref 65–99)
GLUCOSE-CAPILLARY: 142 mg/dL — AB (ref 65–99)
GLUCOSE-CAPILLARY: 113 mg/dL — AB (ref 65–99)
Glucose-Capillary: 102 mg/dL — ABNORMAL HIGH (ref 65–99)

## 2016-11-27 MED ORDER — POTASSIUM CHLORIDE CRYS ER 20 MEQ PO TBCR
40.0000 meq | EXTENDED_RELEASE_TABLET | Freq: Once | ORAL | Status: AC
  Administered 2016-11-27: 40 meq via ORAL
  Filled 2016-11-27: qty 2

## 2016-11-27 MED ORDER — OSELTAMIVIR PHOSPHATE 75 MG PO CAPS
75.0000 mg | ORAL_CAPSULE | Freq: Two times a day (BID) | ORAL | Status: DC
  Administered 2016-11-27: 75 mg via ORAL
  Filled 2016-11-27: qty 1

## 2016-11-27 MED ORDER — OSELTAMIVIR PHOSPHATE 75 MG PO CAPS: 75.0000 mg | ORAL_CAPSULE | Freq: Two times a day (BID) | ORAL | 0 refills | Status: DC

## 2016-11-27 MED ORDER — SACUBITRIL-VALSARTAN 49-51 MG PO TABS
1.0000 | ORAL_TABLET | Freq: Two times a day (BID) | ORAL | Status: DC
  Administered 2016-11-27 (×2): 1 via ORAL
  Filled 2016-11-27 (×2): qty 1

## 2016-11-27 NOTE — Progress Notes (Signed)
PROGRESS NOTE    Gary Hutchinson  FXJ:[id number] DOB: [birth year] DOA: 11/25/2016 PCP: [name], MD   Outpatient Specialists:     Brief Narrative:  Gary Hutchinson is a 56 y.o. gentleman with a history of Nonischemic cardiomyopathy (EF 25-30%), paroxysmal atrial fibrillation (CHADS-Vasc score of 3, anticoagulated with Xarelto), ventricular tachycardia S/P Bi-V AICD, HTN, HLD, DM, and OSA on CPAP qHS who presented to the ED for evaluation after two days of not feeling well with light-headedness and dizziness (but no syncope).  Today, he felt a "pop" in his chest and though his ICD fired.  No significant chest pain with this.  He has had chills but no fever documented until he arrived in the ED.  No cough, cold, or congestion.  No known sick contacts.  No recent procedures.  No recent medication changes.    Assessment & Plan:   Principal Problem:   Sepsis (HCC) Active Problems:   Insulin dependent type 2 diabetes mellitus, uncontrolled (HCC)   Morbid obesity (HCC)   Obstructive sleep apnea   Automatic implantable cardioverter-defibrillator in situ   Atrial fibrillation (HCC)   Chronic systolic heart failure (HCC)   Flu A/B--  -added tamiflu -droplet precautions -d/c zosyn/vanc  Firing of AICD -cardiology consult- suspect troponin elevation related to this -check Mg -replace K to 4 -await EP eval by Dr. [name]  Chronic systolic CHF -restart entresto -appears mildly volume overloaded (scrotum)  DM -SSI -levemir  OSA -CPAP  AKI -IVF -improving  Morbid obesity -encouraged weight loss  A fib -digoxin -xarelto Tele showed paced  Hyperthyroidism -tapazole -TSH normal this admission  Scrotal swelling -patient c/o pain -U/S r/o torsion but shows: Small bilateral hydroceles, containing debris on both sides Patient thinks he felt a mass a few weeks ago -outpatient follow up- improved with elevation-- continue to diurese   DVT prophylaxis:  Fully  anticoagulated   Code Status: Full Code   Family Communication: patient  Disposition Plan:  Home once EP allows   Consultants:   Cards/EP     Subjective: Feeling much better Swelling of testicles decreased with elevation last PM  Objective: Vitals:   11/26/16 2148 11/27/16 0532 11/27/16 0900 11/27/16 1414  BP: (!) 107/51 (!) 115/53 121/65 (!) 95/54  Pulse:  77  74  Resp:    20  Temp:  98 F (36.7 C)  98.4 F (36.9 C)  TempSrc:  Oral  Oral  SpO2:  97%  100%  Weight:  130.7 kg (288 lb 3.2 oz)    Height:        Intake/Output Summary (Last 24 hours) at 11/27/16 1446 Last data filed at 11/27/16 1326  Gross per 24 hour  Intake             1103 ml  Output             2550 ml  Net            -1447 ml   Filed Weights   11/25/16 1832 11/25/16 2313 11/27/16 0532  Weight: 122.5 kg (270 lb) 131.5 kg (289 lb 14.4 oz) 130.7 kg (288 lb 3.2 oz)    Examination:  General exam: Appears calm and comfortable - obese Respiratory system: diminished, no wheezing. Cardiovascular system: S1 & S2 heard, RRR. No JVD, murmurs, rubs, gallops or clicks. No pedal edema. Gastrointestinal system: Abdomen is nondistended, soft and nontender. No organomegaly or masses felt. Normal bowel sounds heard. Central nervous system: Alert and oriented. No focal neurological  deficits.     Data Reviewed: I have personally reviewed following labs and imaging studies  CBC:  Recent Labs Lab 11/25/16 1848 11/26/16 0637 11/27/16 0408  WBC 13.7* 10.4 10.9*  NEUTROABS 11.0*  --   --   HGB 11.4* 9.9* 10.3*  HCT 34.3* 29.7* 30.9*  MCV 83.3 84.6 84.7  PLT 262 209 275   Basic Metabolic Panel:  Recent Labs Lab 11/25/16 1848 11/26/16 0637 11/26/16 1236 11/27/16 0408  NA 135 138  --  138  K 3.5 3.5  --  3.8  CL 100* 105  --  106  CO2 22 21*  --  23  GLUCOSE 183* 118*  --  91  BUN 48* 44*  --  26*  CREATININE 2.32* 1.97*  --  1.38*  CALCIUM 8.6* 8.1*  --  8.8*  MG  --   --  2.1  --      GFR: Estimated Creatinine Clearance: 76.3 mL/min (by C-G formula based on SCr of 1.38 mg/dL (H)). Liver Function Tests:  Recent Labs Lab 11/25/16 1848  AST 30  ALT 23  ALKPHOS 175*  BILITOT 0.9  PROT 7.1  ALBUMIN 2.6*   No results for input(s): LIPASE, AMYLASE in the last 168 hours. No results for input(s): AMMONIA in the last 168 hours. Coagulation Profile:  Recent Labs Lab 11/25/16 1848  INR 2.44   Cardiac Enzymes:  Recent Labs Lab 11/26/16 0032 11/26/16 0637 11/26/16 1236  TROPONINI 0.11* 0.07* 0.05*   BNP (last 3 results) No results for input(s): PROBNP in the last 8760 hours. HbA1C: No results for input(s): HGBA1C in the last 72 hours. CBG:  Recent Labs Lab 11/26/16 1125 11/26/16 1540 11/26/16 2139 11/27/16 0754 11/27/16 1128  GLUCAP 105* 110* 134* 88 142*   Lipid Profile: No results for input(s): CHOL, HDL, LDLCALC, TRIG, CHOLHDL, LDLDIRECT in the last 72 hours. Thyroid Function Tests:  Recent Labs  11/26/16 0032  TSH 1.041   Anemia Panel: No results for input(s): VITAMINB12, FOLATE, FERRITIN, TIBC, IRON, RETICCTPCT in the last 72 hours. Urine analysis:    Component Value Date/Time   COLORURINE YELLOW 11/26/2016 0014   APPEARANCEUR CLEAR 11/26/2016 0014   LABSPEC 1.010 11/26/2016 0014   PHURINE 5.0 11/26/2016 0014   GLUCOSEU NEGATIVE 11/26/2016 0014   HGBUR NEGATIVE 11/26/2016 0014   BILIRUBINUR NEGATIVE 11/26/2016 0014   KETONESUR NEGATIVE 11/26/2016 0014   PROTEINUR NEGATIVE 11/26/2016 0014   UROBILINOGEN 1.0 04/06/2011 2108   NITRITE NEGATIVE 11/26/2016 0014   LEUKOCYTESUR NEGATIVE 11/26/2016 0014     ) Recent Results (from the past 240 hour(s))  Culture, blood (Routine x 2)     Status: None (Preliminary result)   Collection Time: 11/25/16  6:40 PM  Result Value Ref Range Status   Specimen Description BLOOD LEFT ANTECUBITAL  Final   Special Requests BOTTLES DRAWN AEROBIC AND ANAEROBIC 5CC  Final   Culture NO GROWTH < 24  HOURS  Final   Report Status PENDING  Incomplete  Culture, blood (Routine x 2)     Status: None (Preliminary result)   Collection Time: 11/25/16  7:00 PM  Result Value Ref Range Status   Specimen Description BLOOD RIGHT ANTECUBITAL  Final   Special Requests BOTTLES DRAWN AEROBIC AND ANAEROBIC 5CC  Final   Culture NO GROWTH < 24 HOURS  Final   Report Status PENDING  Incomplete      Anti-infectives    Start     Dose/Rate Route Frequency Ordered Stop  11/27/16 2200  oseltamivir (TAMIFLU) capsule 75 mg     75 mg Oral 2 times daily 11/27/16 1257 12/01/16 0959   11/26/16 2030  vancomycin (VANCOCIN) 1,500 mg in sodium chloride 0.9 % 500 mL IVPB  Status:  Discontinued     1,500 mg 250 mL/hr over 120 Minutes Intravenous Every 24 hours 11/25/16 2011 11/26/16 1006   11/26/16 1000  oseltamivir (TAMIFLU) capsule 30 mg  Status:  Discontinued     30 mg Oral 2 times daily 11/26/16 0830 11/27/16 1257   11/26/16 0430  piperacillin-tazobactam (ZOSYN) IVPB 3.375 g  Status:  Discontinued     3.375 g 12.5 mL/hr over 240 Minutes Intravenous Every 8 hours 11/25/16 2011 11/26/16 1006   11/25/16 2015  piperacillin-tazobactam (ZOSYN) IVPB 3.375 g     3.375 g 100 mL/hr over 30 Minutes Intravenous  Once 11/25/16 2010 11/25/16 2156   11/25/16 2015  vancomycin (VANCOCIN) 2,500 mg in sodium chloride 0.9 % 500 mL IVPB     2,500 mg 250 mL/hr over 120 Minutes Intravenous  Once 11/25/16 2010 11/25/16 2326       Radiology Studies: Dg Chest 2 View  Result Date: 11/25/2016 CLINICAL DATA:  Dizziness.  ICD activation today. EXAM: CHEST  2 VIEW COMPARISON:  11/17/2015 FINDINGS: Cardiac pacemaker leads in stable position. Cardiomediastinal silhouette is enlarged. Mediastinal contours appear intact. There is no evidence of focal airspace consolidation, pleural effusion or pneumothorax. Pulmonary vascular congestion. Osseous structures are without acute abnormality. Soft tissues are grossly normal. IMPRESSION: Enlarged  cardiac silhouette. Pulmonary vascular congestion. Stable positioning of ICD leads. Electronically Signed   By: Ted Mcalpine M.D.   On: 11/25/2016 20:21   US Scrotum  Result Date: 11/26/2016 CLINICAL DATA:  Acute onset of scrotal swelling and discomfort. Initial encounter. EXAM: SCROTAL ULTRASOUND DOPPLER ULTRASOUND OF THE TESTICLES TECHNIQUE: Complete ultrasound examination of the testicles, epididymis, and other scrotal structures was performed. Color and spectral Doppler ultrasound were also utilized to evaluate blood flow to the testicles. COMPARISON:  None. FINDINGS: Right testicle Measurements: 2.9 x 2.3 x 2.3 cm. No mass or microlithiasis visualized. Left testicle Measurements: 2.7 x 2.1 x 2.1 cm. No mass or microlithiasis visualized. Right epididymis: There is mild asymmetric prominence of the right epididymis, of uncertain significance. There may be mild associated hyperemia. Left epididymis:  Normal in size and appearance. Hydrocele: Small bilateral hydroceles are seen, containing debris on both sides. Varicocele:  Bilateral patent varicoceles are noted. Pulsed Doppler interrogation of both testes demonstrates normal low resistance arterial and venous waveforms bilaterally. Mild skin thickening is noted about the scrotum. IMPRESSION: 1. Mild asymmetric prominence of the right epididymis, of uncertain significance. There may be mild associated hyperemia. Would correlate for any evidence of epididymitis. 2. Small bilateral hydroceles, containing debris on both sides. 3. Bilateral patent varicoceles noted. 4. No evidence of testicular torsion. 5. Mild skin thickening about the scrotum. Electronically Signed   By: Roanna Raider M.D.   On: 11/26/2016 20:06   [place] Art/ven Flow Abd Pelv Doppler  Result Date: 11/26/2016 CLINICAL DATA:  Acute onset of scrotal swelling and discomfort. Initial encounter. EXAM: SCROTAL ULTRASOUND DOPPLER ULTRASOUND OF THE TESTICLES TECHNIQUE: Complete ultrasound  examination of the testicles, epididymis, and other scrotal structures was performed. Color and spectral Doppler ultrasound were also utilized to evaluate blood flow to the testicles. COMPARISON:  None. FINDINGS: Right testicle Measurements: 2.9 x 2.3 x 2.3 cm. No mass or microlithiasis visualized. Left testicle Measurements: 2.7 x 2.1 x 2.1 cm. No  mass or microlithiasis visualized. Right epididymis: There is mild asymmetric prominence of the right epididymis, of uncertain significance. There may be mild associated hyperemia. Left epididymis:  Normal in size and appearance. Hydrocele: Small bilateral hydroceles are seen, containing debris on both sides. Varicocele:  Bilateral patent varicoceles are noted. Pulsed Doppler interrogation of both testes demonstrates normal low resistance arterial and venous waveforms bilaterally. Mild skin thickening is noted about the scrotum. IMPRESSION: 1. Mild asymmetric prominence of the right epididymis, of uncertain significance. There may be mild associated hyperemia. Would correlate for any evidence of epididymitis. 2. Small bilateral hydroceles, containing debris on both sides. 3. Bilateral patent varicoceles noted. 4. No evidence of testicular torsion. 5. Mild skin thickening about the scrotum. Electronically Signed   By: Roanna Raider M.D.   On: 11/26/2016 20:06        Scheduled Meds: . allopurinol  300 mg Oral Daily  . atorvastatin  40 mg Oral q1800  . digoxin  0.0625 mg Oral QODAY  . insulin aspart  0-15 Units Subcutaneous TID WC  . insulin detemir  36 Units Subcutaneous QHS  . methimazole  10 mg Oral Daily  . oseltamivir  75 mg Oral BID  . rivaroxaban  20 mg Oral Q supper  . sacubitril-valsartan  1 tablet Oral BID  . sodium chloride flush  3 mL Intravenous Q12H   Continuous Infusions:    LOS: 0 days    Time spent: 15 min    Jazmon Kos U Ryon Layton, DO [facility] Pager [phone]  If 7PM-7AM, please contact  night-coverage [email or web address] Password [email or web address] 11/27/2016, 2:46 PM

## 2016-11-27 NOTE — Progress Notes (Signed)
RN called into room by pt. Per pt his scrotum is still swollen & he's concerned about it. MD paged about pt's concerned. Pt's scrotum is currently elevated while the pt is laying in the bed. Per pt he has been swollen before from CHF however he has never been swollen in his scrotum the way he is now. Will continue to monitor the pt. Sanda Linger, RN

## 2016-11-27 NOTE — Progress Notes (Signed)
Pt discharged in stable condition with family members.  VSS and patient denies pain.  Discharge instructions and prescriptions given at time of discharge.  Patient escorted off of unit via wheelchair.

## 2016-11-27 NOTE — Plan of Care (Signed)
Problem: Tissue Perfusion: Goal: Risk factors for ineffective tissue perfusion will decrease Outcome: Completed/Met Date Met: 11/27/16 Pt is on eliquis for anti-coagulation both inpatient & outpatient.

## 2016-11-27 NOTE — Progress Notes (Addendum)
SUBJECTIVE: The patient is feeling much better today.  At this time, he denies chest pain, shortness of breath, or any new concerns.  Marland Kitchen allopurinol  300 mg Oral Daily  . atorvastatin  40 mg Oral q1800  . digoxin  0.0625 mg Oral QODAY  . insulin aspart  0-15 Units Subcutaneous TID WC  . insulin detemir  36 Units Subcutaneous QHS  . methimazole  10 mg Oral Daily  . oseltamivir  30 mg Oral BID  . rivaroxaban  20 mg Oral Q supper  . sacubitril-valsartan  1 tablet Oral BID  . sodium chloride flush  3 mL Intravenous Q12H     OBJECTIVE: Physical Exam: Vitals:   11/26/16 2137 11/26/16 2148 11/27/16 0532 11/27/16 0900  BP: (!) 97/39 (!) 107/51 (!) 115/53 121/65  Pulse: 89  77   Resp:      Temp: 99.1 F (37.3 C)  98 F (36.7 C)   TempSrc: Oral  Oral   SpO2: 99%  97%   Weight:   288 lb 3.2 oz (130.7 kg)   Height:        Intake/Output Summary (Last 24 hours) at 11/27/16 1034 Last data filed at 11/27/16 4098  Gross per 24 hour  Intake             1218 ml  Output             3025 ml  Net            -1807 ml    Telemetry is reviewed by myself, SR with V pacing  GEN- The patient is well appearing, alert and oriented x 3 today.   Head- normocephalic, atraumatic Eyes-  Sclera clear, conjunctiva pink Ears- hearing intact Oropharynx- clear Neck- supple, no JVP Lungs- CTA b/l, normal work of breathing Heart- RRR,  no significant murmurs, no rubs or gallops GI- soft, NT, ND Extremities- no clubbing, cyanosis, no edema is appreciated Skin- no rash or lesion Psych- euthymic mood, full affect Neuro- no gross deficits appreciated  LABS: Basic Metabolic Panel:  Recent Labs  11/91/47 0637 11/26/16 1236 11/27/16 0408  NA 138  --  138  K 3.5  --  3.8  CL 105  --  106  CO2 21*  --  23  GLUCOSE 118*  --  91  BUN 44*  --  26*  CREATININE 1.97*  --  1.38*  CALCIUM 8.1*  --  8.8*  MG  --  2.1  --    Liver Function Tests:  Recent Labs  11/25/16 1848  AST 30  ALT 23    ALKPHOS 175*  BILITOT 0.9  PROT 7.1  ALBUMIN 2.6*   CBC:  Recent Labs  11/25/16 1848 11/26/16 0637 11/27/16 0408  WBC 13.7* 10.4 10.9*  NEUTROABS 11.0*  --   --   HGB 11.4* 9.9* 10.3*  HCT 34.3* 29.7* 30.9*  MCV 83.3 84.6 84.7  PLT 262 209 275   Cardiac Enzymes:  Recent Labs  11/26/16 0032 11/26/16 0637 11/26/16 1236  TROPONINI 0.11* 0.07* 0.05*   Thyroid Function Tests:  Recent Labs  11/26/16 0032  TSH 1.041      ASSESSMENT AND PLAN:   1.Febrile illness/sepsis    + for influenza A and B    C/w IM service    Feeling much better today  2.  ICD shock      Secondary to 1:1 A flutter  His device interrogation show a number of AT/AF episodes particularly  in August, none since then to now. Dr. [name] to see him today  3. Abnormal Trop     No c/o CP     Hx reports cath 2012 with normal coronaries     Likely secondary to demand ischemia  4.  Known PAFib      Historically on amiodarone      This event likely prompted by flu, febrile illness      CHA2DS2Vasc is 3, on Xarelto (Calc Cr clearance is 79)  5. CM, in review of record appears non-ischemic      MIldly overloaded at this time by exam      Follow      I/O negative -    Francis Dowse, [name] 11/27/2016 10:34 AM   I have seen, examined the patient, and reviewed the above assessment and plan.  Mild volume overload on exam.  RRR. Changes to above are made where necessary.  Given that his atrial arrhythmia and ICD shock occurred in the setting of influenza, I am reluctant to make any device related or medicine changes at this time.  I think that we should try to avoid AV nodal ablation. Resume beta blocker. OK to discharge from my standpoint Follow-up with me in the office in 2-3 weeks  Electrophysiology team to see as needed while here. Please call with questions.   Co Sign: Hillis Range, MD 11/27/2016 8:10 PM

## 2016-11-27 NOTE — Discharge Summary (Signed)
Physician Discharge Summary  Gary Hutchinson:[id number] DOB: June 10, 1961 DOA: 11/25/2016  PCP: [name], MD  Admit date: 11/25/2016 Discharge date: 11/27/2016   Recommendations for Outpatient Follow-Up:   1.    Discharge Diagnosis:   Principal Problem:   Sepsis (HCC) Active Problems:   Insulin dependent type 2 diabetes mellitus, uncontrolled (HCC)   Morbid obesity (HCC)   Obstructive sleep apnea   Automatic implantable cardioverter-defibrillator in situ   Atrial fibrillation (HCC)   Chronic systolic heart failure [facility])   Discharge disposition:  Home  Discharge Condition: Improved.  Diet recommendation: Low sodium, heart healthy.  Carbohydrate-modified.  Wound care: None.   History of Present Illness:   Gary Hutchinson is a 56 y.o. gentleman with a history of Nonischemic cardiomyopathy (EF 25-30%), paroxysmal atrial fibrillation (CHADS-Vasc score of 3, anticoagulated with Xarelto), ventricular tachycardia S/P Bi-V AICD, HTN, HLD, DM, and OSA on CPAP qHS who presented to the ED for evaluation after two days of not feeling well with light-headedness and dizziness (but no syncope).  Today, he felt a "pop" in his chest and though his ICD fired.  No significant chest pain with this.  He has had chills but no fever documented until he arrived in the ED.  No cough, cold, or congestion.  No known sick contacts.  No recent procedures.  No recent medication changes.   Hospital Course by Problem:   Flu A/B--  -added tamiflu x 5 days -droplet precautions  Firing of AICD -cardiology consult- suspect troponin elevation related to this -check Mg -replace K to 4 -outpatient follow up  Chronic systolic CHF -restart entresto -appears mildly volume overloaded (scrotum)  DM -SSI -levemir  OSA -CPAP  AKI -IVF -improving  Morbid obesity -encouraged weight loss  A fib -digoxin -xarelto Tele showed paced  Hyperthyroidism -tapazole -TSH normal this  admission  Scrotal swelling -patient c/o pain -U/S r/o torsion but shows: Small bilateral hydroceles, containing debris on both sides Patient thinks he felt a mass a few weeks ago -outpatient follow up- improved with elevation-- continue to diurese -physical exam does not show any current swelling    Medical Consultants:    EP   Discharge Exam:   Vitals:   11/27/16 1414 11/27/16 1705  BP: (!) 95/54 (!) 121/56  Pulse: 74 77  Resp: 20 20  Temp: 98.4 F (36.9 C) 98.7 F (37.1 C)   Vitals:   11/27/16 0532 11/27/16 0900 11/27/16 1414 11/27/16 1705  BP: (!) 115/53 121/65 (!) 95/54 (!) 121/56  Pulse: 77  74 77  Resp:   20 20  Temp: 98 F (36.7 C)  98.4 F (36.9 C) 98.7 F (37.1 C)  TempSrc: Oral  Oral Oral  SpO2: 97%  100%   Weight: 130.7 kg (288 lb 3.2 oz)     Height:        Gen:  NAD    The results of significant diagnostics from this hospitalization (including imaging, microbiology, ancillary and laboratory) are listed below for reference.     Procedures and Diagnostic Studies:   Dg Chest 2 View  Result Date: 11/25/2016 CLINICAL DATA:  Dizziness.  ICD activation today. EXAM: CHEST  2 VIEW COMPARISON:  11/17/2015 FINDINGS: Cardiac pacemaker leads in stable position. Cardiomediastinal silhouette is enlarged. Mediastinal contours appear intact. There is no evidence of focal airspace consolidation, pleural effusion or pneumothorax. Pulmonary vascular congestion. Osseous structures are without acute abnormality. Soft tissues are grossly normal. IMPRESSION: Enlarged cardiac silhouette. Pulmonary vascular congestion. Stable positioning  of ICD leads. Electronically Signed   By: Ted Mcalpine M.D.   On: 11/25/2016 20:21   US Scrotum  Result Date: 11/26/2016 CLINICAL DATA:  Acute onset of scrotal swelling and discomfort. Initial encounter. EXAM: SCROTAL ULTRASOUND DOPPLER ULTRASOUND OF THE TESTICLES TECHNIQUE: Complete ultrasound examination of the testicles,  epididymis, and other scrotal structures was performed. Color and spectral Doppler ultrasound were also utilized to evaluate blood flow to the testicles. COMPARISON:  None. FINDINGS: Right testicle Measurements: 2.9 x 2.3 x 2.3 cm. No mass or microlithiasis visualized. Left testicle Measurements: 2.7 x 2.1 x 2.1 cm. No mass or microlithiasis visualized. Right epididymis: There is mild asymmetric prominence of the right epididymis, of uncertain significance. There may be mild associated hyperemia. Left epididymis:  Normal in size and appearance. Hydrocele: Small bilateral hydroceles are seen, containing debris on both sides. Varicocele:  Bilateral patent varicoceles are noted. Pulsed Doppler interrogation of both testes demonstrates normal low resistance arterial and venous waveforms bilaterally. Mild skin thickening is noted about the scrotum. IMPRESSION: 1. Mild asymmetric prominence of the right epididymis, of uncertain significance. There may be mild associated hyperemia. Would correlate for any evidence of epididymitis. 2. Small bilateral hydroceles, containing debris on both sides. 3. Bilateral patent varicoceles noted. 4. No evidence of testicular torsion. 5. Mild skin thickening about the scrotum. Electronically Signed   By: Roanna Raider M.D.   On: 11/26/2016 20:06   [place] Art/ven Flow Abd Pelv Doppler  Result Date: 11/26/2016 CLINICAL DATA:  Acute onset of scrotal swelling and discomfort. Initial encounter. EXAM: SCROTAL ULTRASOUND DOPPLER ULTRASOUND OF THE TESTICLES TECHNIQUE: Complete ultrasound examination of the testicles, epididymis, and other scrotal structures was performed. Color and spectral Doppler ultrasound were also utilized to evaluate blood flow to the testicles. COMPARISON:  None. FINDINGS: Right testicle Measurements: 2.9 x 2.3 x 2.3 cm. No mass or microlithiasis visualized. Left testicle Measurements: 2.7 x 2.1 x 2.1 cm. No mass or microlithiasis visualized. Right epididymis: There is  mild asymmetric prominence of the right epididymis, of uncertain significance. There may be mild associated hyperemia. Left epididymis:  Normal in size and appearance. Hydrocele: Small bilateral hydroceles are seen, containing debris on both sides. Varicocele:  Bilateral patent varicoceles are noted. Pulsed Doppler interrogation of both testes demonstrates normal low resistance arterial and venous waveforms bilaterally. Mild skin thickening is noted about the scrotum. IMPRESSION: 1. Mild asymmetric prominence of the right epididymis, of uncertain significance. There may be mild associated hyperemia. Would correlate for any evidence of epididymitis. 2. Small bilateral hydroceles, containing debris on both sides. 3. Bilateral patent varicoceles noted. 4. No evidence of testicular torsion. 5. Mild skin thickening about the scrotum. Electronically Signed   By: Roanna Raider M.D.   On: 11/26/2016 20:06     Labs:   Basic Metabolic Panel:  Recent Labs Lab 11/25/16 1848 11/26/16 0637 11/26/16 1236 11/27/16 0408  NA 135 138  --  138  K 3.5 3.5  --  3.8  CL 100* 105  --  106  CO2 22 21*  --  23  GLUCOSE 183* 118*  --  91  BUN 48* 44*  --  26*  CREATININE 2.32* 1.97*  --  1.38*  CALCIUM 8.6* 8.1*  --  8.8*  MG  --   --  2.1  --    GFR Estimated Creatinine Clearance: 76.3 mL/min (by C-G formula based on SCr of 1.38 mg/dL (H)). Liver Function Tests:  Recent Labs Lab 11/25/16 1848  AST 30  ALT 23  ALKPHOS 175*  BILITOT 0.9  PROT 7.1  ALBUMIN 2.6*   No results for input(s): LIPASE, AMYLASE in the last 168 hours. No results for input(s): AMMONIA in the last 168 hours. Coagulation profile  Recent Labs Lab 11/25/16 1848  INR 2.44    CBC:  Recent Labs Lab 11/25/16 1848 11/26/16 0637 11/27/16 0408  WBC 13.7* 10.4 10.9*  NEUTROABS 11.0*  --   --   HGB 11.4* 9.9* 10.3*  HCT 34.3* 29.7* 30.9*  MCV 83.3 84.6 84.7  PLT 262 209 275   Cardiac Enzymes:  Recent Labs Lab  11/26/16 0032 11/26/16 0637 11/26/16 1236  TROPONINI 0.11* 0.07* 0.05*   BNP: Invalid input(s): POCBNP CBG:  Recent Labs Lab 11/26/16 1540 11/26/16 2139 11/27/16 0754 11/27/16 1128 11/27/16 1600  GLUCAP 110* 134* 88 142* 102*   D-Dimer No results for input(s): DDIMER in the last 72 hours. Hgb A1c No results for input(s): HGBA1C in the last 72 hours. Lipid Profile No results for input(s): CHOL, HDL, LDLCALC, TRIG, CHOLHDL, LDLDIRECT in the last 72 hours. Thyroid function studies  Recent Labs  11/26/16 0032  TSH 1.041   Anemia work up No results for input(s): VITAMINB12, FOLATE, FERRITIN, TIBC, IRON, RETICCTPCT in the last 72 hours. Microbiology Recent Results (from the past 240 hour(s))  Culture, blood (Routine x 2)     Status: None (Preliminary result)   Collection Time: 11/25/16  6:40 PM  Result Value Ref Range Status   Specimen Description BLOOD LEFT ANTECUBITAL  Final   Special Requests BOTTLES DRAWN AEROBIC AND ANAEROBIC 5CC  Final   Culture NO GROWTH 2 DAYS  Final   Report Status PENDING  Incomplete  Culture, blood (Routine x 2)     Status: None (Preliminary result)   Collection Time: 11/25/16  7:00 PM  Result Value Ref Range Status   Specimen Description BLOOD RIGHT ANTECUBITAL  Final   Special Requests BOTTLES DRAWN AEROBIC AND ANAEROBIC 5CC  Final   Culture NO GROWTH 2 DAYS  Final   Report Status PENDING  Incomplete     Discharge Instructions:   Discharge Instructions    (HEART FAILURE PATIENTS) Call MD:  Anytime you have any of the following symptoms: 1) 3 pound weight gain in 24 hours or 5 pounds in 1 week 2) shortness of breath, with or without a dry hacking cough 3) swelling in the hands, feet or stomach 4) if you have to sleep on extra pillows at night in order to breathe.    Complete by:  As directed    Diet - low sodium heart healthy    Complete by:  As directed    Diet Carb Modified    Complete by:  As directed    Heart Failure patients  record your daily weight using the same scale at the same time of day    Complete by:  As directed    Increase activity slowly    Complete by:  As directed      Allergies as of 11/27/2016   No Known Allergies     Medication List    STOP taking these medications   hydrALAZINE 50 MG tablet Commonly known as:  APRESOLINE     TAKE these medications   acetaminophen 325 MG tablet Commonly known as:  TYLENOL Take 325-650 mg by mouth every 6 (six) hours as needed (for pain). Reported on 10/11/2015   ADVAIR DISKUS 250-50 MCG/DOSE Aepb Generic drug:  Fluticasone-Salmeterol Inhale 1 puff into  the lungs every 12 (twelve) hours as needed (shortness of breath). Reported on 02/06/2016   allopurinol 300 MG tablet Commonly known as:  ZYLOPRIM Take 300 mg by mouth daily.   atorvastatin 40 MG tablet Commonly known as:  LIPITOR Take 1 tablet (40 mg total) by mouth daily.   bisoprolol 5 MG tablet Commonly known as:  ZEBETA Take 1 tablet (5 mg total) by mouth daily.   colchicine 0.6 MG tablet Take 0.6 mg by mouth daily as needed (for gout flares). Reported on 02/06/2016   digoxin 0.125 MG tablet Commonly known as:  LANOXIN Take 0.5 tablets (0.0625 mg total) by mouth every other day.   insulin detemir 100 UNIT/ML injection Commonly known as:  LEVEMIR Inject 36 Units into the skin at bedtime.   isosorbide mononitrate 30 MG 24 hr tablet Commonly known as:  IMDUR TAKE 1 TABLET (30 MG TOTAL) BY MOUTH 2 (TWO) TIMES DAILY.   linagliptin 5 MG Tabs tablet Commonly known as:  TRADJENTA Take 1 tablet (5 mg total) by mouth daily.   methimazole 10 MG tablet Commonly known as:  TAPAZOLE Take 10 mg by mouth daily.   metolazone 2.5 MG tablet Commonly known as:  ZAROXOLYN Take 2.5 mg by mouth daily as needed (edema). Reported on 02/06/2016   montelukast 10 MG tablet Commonly known as:  SINGULAIR Take 10 mg by mouth daily as needed (for shortness of breath or wheezing).   oseltamivir 75 MG  capsule Commonly known as:  TAMIFLU Take 1 capsule (75 mg total) by mouth 2 (two) times daily.   PROAIR HFA 108 (90 Base) MCG/ACT inhaler Generic drug:  albuterol Inhale 2 puffs into the lungs every 6 (six) hours as needed for wheezing or shortness of breath. Reported on 02/06/2016   rivaroxaban 20 MG Tabs tablet Commonly known as:  XARELTO Take 1 tablet (20 mg total) by mouth daily with supper.   sacubitril-valsartan 49-51 MG Commonly known as:  ENTRESTO Take 1 tablet by mouth 2 (two) times daily.   spironolactone 25 MG tablet Commonly known as:  ALDACTONE Take 1/2 tablet by mouth once a day   torsemide 20 MG tablet Commonly known as:  DEMADEX Take 40 mg (2 tabs) in am and 20 mg (1 tab) in pm What changed:  how much to take  how to take this  when to take this  additional instructions      Follow-up Information    RUSSO,JOHN M, MD Follow up in 1 week(s).   Specialty:  Internal Medicine Contact information: [address] [place] [address] [phone]        Hillis Range, MD Follow up in 2 week(s).   Specialty:  Cardiology Contact information: [address] [place] [address] [phone]            Time coordinating discharge: 35 min  Signed:  Kialee Kham Juanetta Gosling   [facility] 11/27/2016, 8:50 PM

## 2016-11-27 NOTE — Care Management Note (Signed)
Case Management Note  Patient Details  Name: MONTERIO TELLECHEA MRN: [id number] Date of Birth: 1960/12/08  Subjective/Objective:  Pt presented for Sepsis. Pt is from home. CM will monitor for disposition needs.   Action/Plan: Benefits check completed for Entresto   S/W  ROSIE @ [employer] RX # [id number]   ENTRESTO 49-51 MG BID   COVER- YES  CO-PAY- $ 8.35  PRIOR APPROVAL- NO   PHARMACY : [facility]   Expected Discharge Date:                  Expected Discharge Plan:  Home/Self Care  In-House Referral:  NA  Discharge planning Services  CM Consult, Medication Assistance  Post Acute Care Choice:    Choice offered to:     DME Arranged:    DME Agency:     HH Arranged:    HH Agency:     Status of Service:  In process, will continue to follow  If discussed at Long Length of Stay Meetings, dates discussed:    Additional Comments:  Gala Lewandowsky, RN 11/27/2016, 2:39 PM

## 2016-11-27 NOTE — Plan of Care (Signed)
Problem: Fluid Volume: Goal: Ability to maintain a balanced intake and output will improve Outcome: Completed/Met Date Met: 11/27/16 Pt is aware of the need for adequate fluid management. Pt understands the importance of proper intake & output as well as the importance of weighing himself daily. Pt's education was verified via the teach back method.

## 2016-11-27 NOTE — Plan of Care (Signed)
Problem: Education: Goal: Knowledge of [facility] Education information/materials will improve Outcome: Completed/Met Date Met: 11/27/16 Pt is aware of Drain policies. Pt knows the importance of droplet precautions as well as handwashing. Pt is also aware of the plan of care.

## 2016-11-30 LAB — CULTURE, BLOOD (ROUTINE X 2)
CULTURE: NO GROWTH
CULTURE: NO GROWTH

## 2016-12-03 ENCOUNTER — Encounter (HOSPITAL_COMMUNITY): Payer: Self-pay | Admitting: Internal Medicine

## 2016-12-03 ENCOUNTER — Ambulatory Visit (HOSPITAL_COMMUNITY)
Admission: RE | Admit: 2016-12-03 | Discharge: 2016-12-03 | Disposition: A | Discharge status: Home / Self Care | Payer: Medicare Other | Source: Ambulatory Visit | Attending: Internal Medicine | Admitting: Internal Medicine

## 2016-12-03 VITALS — BP 118/60 | HR 70 | Wt 283.5 lb

## 2016-12-03 DIAGNOSIS — I5022 Chronic systolic (congestive) heart failure: Secondary | ICD-10-CM

## 2016-12-03 DIAGNOSIS — K648 Other hemorrhoids: Secondary | ICD-10-CM | POA: Diagnosis not present

## 2016-12-03 DIAGNOSIS — J45909 Unspecified asthma, uncomplicated: Secondary | ICD-10-CM | POA: Diagnosis not present

## 2016-12-03 DIAGNOSIS — I272 Pulmonary hypertension, unspecified: Secondary | ICD-10-CM | POA: Insufficient documentation

## 2016-12-03 DIAGNOSIS — E785 Hyperlipidemia, unspecified: Secondary | ICD-10-CM | POA: Insufficient documentation

## 2016-12-03 DIAGNOSIS — Z7901 Long term (current) use of anticoagulants: Secondary | ICD-10-CM | POA: Insufficient documentation

## 2016-12-03 DIAGNOSIS — I429 Cardiomyopathy, unspecified: Secondary | ICD-10-CM | POA: Diagnosis present

## 2016-12-03 DIAGNOSIS — I472 Ventricular tachycardia: Secondary | ICD-10-CM | POA: Insufficient documentation

## 2016-12-03 DIAGNOSIS — I480 Paroxysmal atrial fibrillation: Secondary | ICD-10-CM | POA: Diagnosis not present

## 2016-12-03 DIAGNOSIS — Z79899 Other long term (current) drug therapy: Secondary | ICD-10-CM | POA: Insufficient documentation

## 2016-12-03 DIAGNOSIS — G4733 Obstructive sleep apnea (adult) (pediatric): Secondary | ICD-10-CM | POA: Insufficient documentation

## 2016-12-03 DIAGNOSIS — Z794 Long term (current) use of insulin: Secondary | ICD-10-CM | POA: Insufficient documentation

## 2016-12-03 DIAGNOSIS — N183 Chronic kidney disease, stage 3 (moderate): Secondary | ICD-10-CM | POA: Insufficient documentation

## 2016-12-03 DIAGNOSIS — E1122 Type 2 diabetes mellitus with diabetic chronic kidney disease: Secondary | ICD-10-CM | POA: Insufficient documentation

## 2016-12-03 DIAGNOSIS — Z95810 Presence of automatic (implantable) cardiac defibrillator: Secondary | ICD-10-CM | POA: Diagnosis not present

## 2016-12-03 DIAGNOSIS — I130 Hypertensive heart and chronic kidney disease with heart failure and stage 1 through stage 4 chronic kidney disease, or unspecified chronic kidney disease: Secondary | ICD-10-CM | POA: Diagnosis present

## 2016-12-03 DIAGNOSIS — M109 Gout, unspecified: Secondary | ICD-10-CM | POA: Diagnosis not present

## 2016-12-03 LAB — COMPREHENSIVE METABOLIC PANEL
ALT: 22 U/L (ref 17–63)
AST: 25 U/L (ref 15–41)
Albumin: 2.4 g/dL — ABNORMAL LOW (ref 3.5–5.0)
Alkaline Phosphatase: 135 U/L — ABNORMAL HIGH (ref 38–126)
Anion gap: 9 (ref 5–15)
BILIRUBIN TOTAL: 0.4 mg/dL (ref 0.3–1.2)
BUN: 34 mg/dL — AB (ref 6–20)
CHLORIDE: 103 mmol/L (ref 101–111)
CO2: 24 mmol/L (ref 22–32)
CREATININE: 1.54 mg/dL — AB (ref 0.61–1.24)
Calcium: 9 mg/dL (ref 8.9–10.3)
GFR calc Af Amer: 57 mL/min — ABNORMAL LOW (ref >60)
GFR, EST NON AFRICAN AMERICAN: 49 mL/min — AB (ref >60)
Glucose, Bld: 133 mg/dL — ABNORMAL HIGH (ref 65–99)
POTASSIUM: 4 mmol/L (ref 3.5–5.1)
Sodium: 136 mmol/L (ref 135–145)
Total Protein: 7.2 g/dL (ref 6.5–8.1)

## 2016-12-03 LAB — TSH: TSH: 2.187 u[IU]/mL (ref 0.350–4.500)

## 2016-12-03 LAB — T4, FREE: Free T4: 1.06 ng/dL (ref 0.61–1.12)

## 2016-12-03 NOTE — Progress Notes (Signed)
Medication Samples have been provided to the patient.  Drug name: Xarelto       Strength: 20 mg         Qty: 3  LOT: [id number]  Exp.Date: 4/20  Dosing instructions: 1 tab daily  The patient has been instructed regarding the correct time, dose, and frequency of taking this medication, including desired effects and most common side effects.   Jessaca Philippi 10:46 AM 12/03/2016

## 2016-12-03 NOTE — Addendum Note (Signed)
Encounter addended by: Noralee Space, RN on: 12/03/2016 10:48 AM<BR>    Actions taken: Order list changed, Diagnosis association updated, Sign clinical note

## 2016-12-03 NOTE — Patient Instructions (Signed)
Take Metolazone 2.5 mg TODAY  Labs today  Your physician recommends that you schedule a follow-up appointment in: 1 month with Fara Chute D  Your physician recommends that you schedule a follow-up appointment in: 4 months

## 2016-12-03 NOTE — Progress Notes (Signed)
Patient ID: JUANJESUS PEPPERMAN, male   DOB: 04-Aug-1961, 56 y.o.   MRN: [id number]  [facility]  PCP: Creola Corn EP: Hillis Range  HPI: Mr. Lutes is a 56 year old male with a history of chronic systolic heart failure, ICM s/p ICD, HTN, DM II, OSA on CPAP, history of ventricular tachycardia status post AICD placement in 2009, atrial fibrillation and morbid obesity.    Admitted 6/1-03/13/14 for A/C HF and cardiogenic shock (co-ox 42%). Diuresed with IV lasix and milrinone which were weaned off.  He went into Afib with RVR and chemically cardioverted back to NSR with IV amiodarone. Placed on Xarelto. BB restarted 1/2 dose and held ACE-I. His blood sugars were elevated along with Hgb A1C and started on insulin. Discharge weight 344 lbs.   RHC (05/2014): Left sided filling pressures well compensated, mild pulm HTN with elevated right-sided pressures and mod/severely depressed CO. Discussion about trial of milrinone but patient wanted to hold off.  Admitted January 2017 with volume overload and cellulitis. Diuresed with IV lasix and required short term milrinone. Loaded on amio and had successful DC-CV on  10/17/2015.  Discharge weight was 307 pounds.   Admitted 2/18 with Influenza A/B. 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 Seen by EP and managed conservatively. Received IVF in hospital for sepsis but not diuresed back down afterward. Hydralazine and Imdur also stopped. Says weight coming down slowly at home. Denies SOB. No orthopnea or PND. SBP 105-140. No dizziness. No palpitations  Optivol - Fluid index well above threshold.  No further Afib. Pt activity about 1 hrs daily.    05/03/12: EF 25-30%.  Grade 1 diastolic dysfunction.  Mild MR.  Mod dilated LA.   07/27/13: EF 40%, diff HK, MR mild, LA mild/mod dilated  03/2014: EF30-35%, grade II DD, mild MR, RV systolic fx mildly decreased 10/2014: EF 40%.  10/2015: EF ~40%. RV mildly dilated.  8/17 EF 25-35%   Labs    03/08/13 K 3.8 Creatinine 1.02  07/27/13 K 4.7 Creatinine 1.61 Dig level 0.9 ---> dig cut back to 0.125 mg every other day 10/10/13 K 3.7 Creatinine 1.4  03/17/14: K 3.8, Creatinine 1.8, BUN 29 (PCP office) 05/10/14: K 4.0 Cr 2.4 11/17/15: K 3.2, Creatinine 2.04 05/18/14: K 4.1, Cr 1.8, BUN 34, dig level 0.7,  06/06/14: K 4.2 Creatinine 1.34  08/21/2014: K 4.1 Creatinine 1.46 Dig level 0.7 10/19/2015: K 3.6 Creatinine 2.15  11/21/15: K 3.9, Creatinine 1.38  SH: Lives with son in [place]. Disabled. No ETOH or tobacco abuse  FH; Mother living: HT        Father deceased: was not part of his life so not sure health issues  ROS: All systems negative except as listed in HPI, PMH and Problem List.  Past Medical History:  Diagnosis Date  . AICD (automatic cardioverter/defibrillator) present   . Alcohol abuse    now quit  . Anemia    iron defi  . Arthritis    "fingers, knees, some days all my joints ache" (11/16/2015)  . Asthma   . Cholecystitis   . Chronic systolic congestive heart failure (HCC)    a. RHC (02/2011) RA 31/27, RV 71/27, PA67/45, PCWP 46, PA 49%, Fick CO: 3.4 b. ECHO (03/2014) EF 30-35%, grade II DD, mild MR, RV poorly visualized appears mildly decreased c. RHC (05/2014) RA 13, RV 54/4/11, PA 60/22 (37), PCWP 17, Fick CO/CI: 4.4 / 1.9, Thermo CO/CI: 3.8 /1.6, PVR 5.2 WU, PA 57%  and 59%  . Diverticulosis of colon   . Gout   . Hemorrhoids, internal   . Hyperlipidemia   . Hypertension   . Morbid obesity (HCC)   . Nonischemic cardiomyopathy (HCC)    a. LHC (02/2011) normal coronaries  . OSA on CPAP   . Paroxysmal atrial fibrillation (HCC)   . Pneumonia 2000s X 1  . Pulmonary hypertension   . Renal insufficiency   . Type II diabetes mellitus (HCC)   . Ventricular tachycardia [facility])    s/p MDT ICD implant    Current Outpatient Prescriptions  Medication Sig Dispense Refill  . acetaminophen (TYLENOL) 325 MG tablet Take 325-650 mg by mouth every 6 (six) hours as needed (for pain).  Reported on 10/11/2015    . albuterol (PROAIR HFA) 108 (90 BASE) MCG/ACT inhaler Inhale 2 puffs into the lungs every 6 (six) hours as needed for wheezing or shortness of breath. Reported on 02/06/2016    . allopurinol (ZYLOPRIM) 300 MG tablet Take 300 mg by mouth daily.    Marland Kitchen atorvastatin (LIPITOR) 40 MG tablet Take 1 tablet (40 mg total) by mouth daily. 90 tablet 6  . bisoprolol (ZEBETA) 5 MG tablet Take 1 tablet (5 mg total) by mouth daily. 30 tablet 5  . digoxin (LANOXIN) 0.125 MG tablet Take 0.5 tablets (0.0625 mg total) by mouth every other day. 45 tablet 3  . Fluticasone-Salmeterol (ADVAIR DISKUS) 250-50 MCG/DOSE AEPB Inhale 1 puff into the lungs every 12 (twelve) hours as needed (shortness of breath). Reported on 02/06/2016    . insulin detemir (LEVEMIR) 100 UNIT/ML injection Inject 36 Units into the skin at bedtime.     . isosorbide mononitrate (IMDUR) 30 MG 24 hr tablet TAKE 1 TABLET (30 MG TOTAL) BY MOUTH 2 (TWO) TIMES DAILY. 180 tablet 3  . linagliptin (TRADJENTA) 5 MG TABS tablet Take 1 tablet (5 mg total) by mouth daily. 30 tablet 3  . methimazole (TAPAZOLE) 10 MG tablet Take 10 mg by mouth daily.     . montelukast (SINGULAIR) 10 MG tablet Take 10 mg by mouth daily as needed (for shortness of breath or wheezing).     . rivaroxaban (XARELTO) 20 MG TABS tablet Take 1 tablet (20 mg total) by mouth daily with supper. 30 tablet 11  . sacubitril-valsartan (ENTRESTO) 49-51 MG Take 1 tablet by mouth 2 (two) times daily. 60 tablet 3  . spironolactone (ALDACTONE) 25 MG tablet Take 1/2 tablet by mouth once a day    . torsemide (DEMADEX) 20 MG tablet Take 40 mg (2 tabs) in am and 20 mg (1 tab) in pm 270 tablet 3  . colchicine 0.6 MG tablet Take 0.6 mg by mouth daily as needed (for gout flares). Reported on 02/06/2016    . metolazone (ZAROXOLYN) 2.5 MG tablet Take 2.5 mg by mouth daily as needed (edema). Reported on 02/06/2016     No current facility-administered medications for this encounter.      Vitals:   12/03/16 0953  BP: 118/60  Pulse: 70  SpO2: 99%  Weight: 283 lb 8 oz (128.6 kg)   Wt Readings from Last 3 Encounters:  12/03/16 283 lb 8 oz (128.6 kg)  11/27/16 288 lb 3.2 oz (130.7 kg)  11/12/16 287 lb 3.2 oz (130.3 kg)     PHYSICAL EXAM: General:  Well appearing. NAD.   HEENT: Normal.  Neck: Thick. JVP difficult to assess about 7. Carotids 2+ bilaterally; no bruits. No thyromegaly or nodule noted.  Cor: PMI nonpalpable. RRR.  No rubs or gallops. 2/6 TR murmur.  Lungs: Clear, normal effort Abdomen: obese, soft, NT, ND, no HSM. No bruits or masses. +BS  Extremities: no cyanosis, clubbing, rash. Trace edema.  Neuro: alert & orientedx3, cranial nerves grossly intact. Moves all 4 extremities w/o difficulty. Affect pleasant.   ASSESSMENT & PLAN:   1) Chronic systolic HF: NICM s/p BiV Medtronic, EF 25% 06/2016.   - NYHA II symptoms . Volume status elevated on exam and by Optivol - Continue torsemide 40 mg in am and 20 mg in pm. Will have him take metolazone for 1-2 days with extra potassium. - Continue entresto 49/51 BID.Marland Kitchen - Continue bisoprolol 5 mg daily.  - Continue digoxin 0.0625 mg every other day. - Was previously on hydralazine 50 mg TID. Continue Imdur 30 mg BID. Stopped during recent hospitalization. BP now bouncing back. Will have him come back to [facility] next month to get back on hydral 25 tid and imdur 30.  - Reinforced fluid restriction to < 2 L daily, sodium restriction to less than 2000 mg daily, and the importance of daily weights.   2) CKD stage III -  Followed by nephrology.  - Most recent creatinine 1.38  3) HTN - Meds as above. current regimen.  4) OSA - Continue to wear CPAP nightly 5) PAF/AFL-  S/P Succesful DC-CV 10/17/2015  (Initial episode 03/2014) - Recurrent A fib 10/2015 with successful DC/CV on 10/17/15.  - Had episode of AFL with ICD shock in 2/18 at time of influenza - Now off amiodarone with thyrotoxicity. Evaluated by EP and for now  will follow. If he has further AF will need to consider tikosyn or ablation. - ICD interrogation today with no recurrent AF since hospitalization - Continue Xarelto 20 mg. He states he has not missed any doses.  - Check CMET and TFTs today  - Needs yearly eye exam and he is aware.  6) Obesity:   - Continued to encouraged to lose weight and increase activity as able.  7) Amiodarone thyroxicity  - Off Amiodarone. Continue Methimazole 10 mg BID for now.  (recently decreased by PCP) - PCP following thyroid.  - No further Afib.   Arvilla Meres MD 12/03/2016 10:18 AM

## 2016-12-05 ENCOUNTER — Encounter: Payer: Self-pay | Admitting: Internal Medicine

## 2016-12-15 ENCOUNTER — Encounter: Payer: Medicare Other | Admitting: Internal Medicine

## 2016-12-26 ENCOUNTER — Encounter: Payer: Self-pay | Admitting: Internal Medicine

## 2016-12-26 ENCOUNTER — Ambulatory Visit (INDEPENDENT_AMBULATORY_CARE_PROVIDER_SITE_OTHER): Payer: Medicare Other | Admitting: Internal Medicine

## 2016-12-26 ENCOUNTER — Encounter (INDEPENDENT_AMBULATORY_CARE_PROVIDER_SITE_OTHER): Payer: Self-pay

## 2016-12-26 VITALS — BP 132/64 | HR 72 | Ht 65.0 in | Wt 285.0 lb

## 2016-12-26 DIAGNOSIS — I428 Other cardiomyopathies: Secondary | ICD-10-CM | POA: Diagnosis not present

## 2016-12-26 DIAGNOSIS — I480 Paroxysmal atrial fibrillation: Secondary | ICD-10-CM

## 2016-12-26 DIAGNOSIS — I5022 Chronic systolic (congestive) heart failure: Secondary | ICD-10-CM

## 2016-12-26 DIAGNOSIS — Z95810 Presence of automatic (implantable) cardiac defibrillator: Secondary | ICD-10-CM

## 2016-12-26 DIAGNOSIS — I472 Ventricular tachycardia, unspecified: Secondary | ICD-10-CM

## 2016-12-26 LAB — CUP PACEART INCLINIC DEVICE CHECK
Battery Remaining Longevity: 98 mo
Battery Voltage: 3 V
Brady Statistic AP VS Percent: 0.02 %
Brady Statistic RA Percent Paced: 0.6 %
Brady Statistic RV Percent Paced: 2.99 %
HIGH POWER IMPEDANCE MEASURED VALUE: 81 Ohm
Implantable Lead Implant Date: 20170210
Implantable Lead Location: 753858
Implantable Lead Location: 753860
Implantable Lead Model: 4598
Implantable Lead Model: 5076
Implantable Lead Model: 6935
Implantable Pulse Generator Implant Date: 20170210
Lead Channel Impedance Value: 342 Ohm
Lead Channel Impedance Value: 475 Ohm
Lead Channel Impedance Value: 532 Ohm
Lead Channel Impedance Value: 646 Ohm
Lead Channel Impedance Value: 817 Ohm
Lead Channel Pacing Threshold Amplitude: 0.875 V
Lead Channel Pacing Threshold Pulse Width: 0.4 ms
Lead Channel Sensing Intrinsic Amplitude: 12.375 mV
Lead Channel Setting Pacing Amplitude: 2 V
Lead Channel Setting Pacing Amplitude: 2.25 V
Lead Channel Setting Pacing Pulse Width: 0.4 ms
Lead Channel Setting Sensing Sensitivity: 0.3 mV
MDC IDC LEAD IMPLANT DT: 20090901
MDC IDC LEAD IMPLANT DT: 20170210
MDC IDC LEAD LOCATION: 753859
MDC IDC MSMT LEADCHNL LV IMPEDANCE VALUE: 361 Ohm
MDC IDC MSMT LEADCHNL LV IMPEDANCE VALUE: 399 Ohm
MDC IDC MSMT LEADCHNL LV IMPEDANCE VALUE: 418 Ohm
MDC IDC MSMT LEADCHNL LV IMPEDANCE VALUE: 703 Ohm
MDC IDC MSMT LEADCHNL LV IMPEDANCE VALUE: 817 Ohm
MDC IDC MSMT LEADCHNL LV IMPEDANCE VALUE: 836 Ohm
MDC IDC MSMT LEADCHNL LV PACING THRESHOLD AMPLITUDE: 1.125 V
MDC IDC MSMT LEADCHNL LV PACING THRESHOLD PULSEWIDTH: 0.4 ms
MDC IDC MSMT LEADCHNL RA IMPEDANCE VALUE: 475 Ohm
MDC IDC MSMT LEADCHNL RA SENSING INTR AMPL: 4.25 mV
MDC IDC MSMT LEADCHNL RV IMPEDANCE VALUE: 361 Ohm
MDC IDC MSMT LEADCHNL RV PACING THRESHOLD AMPLITUDE: 1 V
MDC IDC MSMT LEADCHNL RV PACING THRESHOLD PULSEWIDTH: 0.4 ms
MDC IDC SESS DTM: 20180323092823
MDC IDC SET LEADCHNL LV PACING PULSEWIDTH: 0.4 ms
MDC IDC SET LEADCHNL RA PACING AMPLITUDE: 1.75 V
MDC IDC STAT BRADY AP VP PERCENT: 0.58 %
MDC IDC STAT BRADY AS VP PERCENT: 97.88 %
MDC IDC STAT BRADY AS VS PERCENT: 1.52 %

## 2016-12-26 NOTE — Patient Instructions (Signed)
Medication Instructions: - Your physician recommends that you continue on your current medications as directed. Please refer to the Current Medication list given to you today.  Labwork: - none ordered  Procedures/Testing: - none ordered  Follow-Up: - Remote monitoring is used to monitor your Pacemaker of ICD from home. This monitoring reduces the number of office visits required to check your device to one time per year. It allows [place] to keep an eye on the functioning of your device to ensure it is working properly. You are scheduled for a device check from home on 03/30/17. You may send your transmission at any time that day. If you have a wireless device, the transmission will be sent automatically. After your physician reviews your transmission, you will receive a postcard with your next transmission date.  - Your physician wants you to follow-up in: 6 months with Gypsy Balsam, NP for Dr. [name]. You will receive a reminder letter in the mail two months in advance. If you don't receive a letter, please call our office to schedule the follow-up appointment.  Any Additional Special Instructions Will Be Listed Below (If Applicable).     If you need a refill on your cardiac medications before your next appointment, please call your pharmacy.

## 2016-12-26 NOTE — Progress Notes (Signed)
Electrophysiology Office Note   Date:  12/26/2016   ID:  Gary Hutchinson, DOB 1961/07/20, MRN [id number]  PCP:  [name], MD  Cardiologist:  [facility] clinic Primary Electrophysiologist: Hillis Range, MD    Chief Complaint  Patient presents with  . Atrial Fibrillation     History of Present Illness: Gary Hutchinson is a 56 y.o. male who presents today for electrophysiology follow-up.   He is doing very well since his recent hospitalization for influenza.  No further arrhythmias. Today, he denies symptoms of palpitations, chest pain,  orthopnea, PND,  claudication, dizziness, presyncope, syncope, bleeding, or neurologic sequela. The patient is tolerating medications without difficulties and is otherwise without complaint today.    Past Medical History:  Diagnosis Date  . AICD (automatic cardioverter/defibrillator) present   . Alcohol abuse    now quit  . Anemia    iron defi  . Arthritis    "fingers, knees, some days all my joints ache" (11/16/2015)  . Asthma   . Cholecystitis   . Chronic systolic congestive heart failure (HCC)    a. RHC (02/2011) RA 31/27, RV 71/27, PA67/45, PCWP 46, PA 49%, Fick CO: 3.4 b. ECHO (03/2014) EF 30-35%, grade II DD, mild MR, RV poorly visualized appears mildly decreased c. RHC (05/2014) RA 13, RV 54/4/11, PA 60/22 (37), PCWP 17, Fick CO/CI: 4.4 / 1.9, Thermo CO/CI: 3.8 /1.6, PVR 5.2 WU, PA 57% and 59%  . Diverticulosis of colon   . Gout   . Hemorrhoids, internal   . Hyperlipidemia   . Hypertension   . Morbid obesity (HCC)   . Nonischemic cardiomyopathy (HCC)    a. LHC (02/2011) normal coronaries  . OSA on CPAP   . Paroxysmal atrial fibrillation (HCC)   . Pneumonia 2000s X 1  . Pulmonary hypertension   . Renal insufficiency   . Type II diabetes mellitus (HCC)   . Ventricular tachycardia [facility])    s/p MDT ICD implant   Past Surgical History:  Procedure Laterality Date  . CARDIAC CATHETERIZATION  03/08/2004   EF 25-30%  . CARDIOVERSION N/A  10/17/2015   Procedure: CARDIOVERSION;  Surgeon: [name], MD;  Location: [facility] ENDOSCOPY;  Service: Cardiovascular;  Laterality: N/A;  . EP IMPLANTABLE DEVICE N/A 11/16/2015   Procedure: BiVI Upgrade;  Surgeon: Hillis Range, MD;  Medtronic [address] [place]  . INSERTION OF ICD  06/2008   ICD- Medtronic   . RIGHT HEART CATHETERIZATION N/A 05/23/2014   Procedure: RIGHT HEART CATH;  Surgeon: [name], MD;  Location[facility] C[facility] LAB;  Service: Cardiovascular;  Laterality: N/A;  . TRANSTHORACIC ECHOCARDIOGRAM  05/27/2008   EF 30-35%  . US ECHOCARDIOGRAPHY  08/22/2008   EF 30-35%     Current Outpatient Prescriptions  Medication Sig Dispense Refill  . acetaminophen (TYLENOL) 325 MG tablet Take 325-650 mg by mouth every 6 (six) hours as needed (for pain). Reported on 10/11/2015    . albuterol (PROAIR HFA) 108 (90 BASE) MCG/ACT inhaler Inhale 2 puffs into the lungs every 6 (six) hours as needed for wheezing or shortness of breath. Reported on 02/06/2016    . allopurinol (ZYLOPRIM) 300 MG tablet Take 300 mg by mouth daily.    Marland Kitchen atorvastatin (LIPITOR) 40 MG tablet Take 1 tablet (40 mg total) by mouth daily. 90 tablet 6  . bisoprolol (ZEBETA) 5 MG tablet Take 1 tablet (5 mg total) by mouth daily. 30 tablet 5  . colchicine 0.6 MG tablet Take 0.6 mg by  mouth daily as needed (for gout flares). Reported on 02/06/2016    . digoxin (LANOXIN) 0.125 MG tablet Take 0.5 tablets (0.0625 mg total) by mouth every other day. 45 tablet 3  . Fluticasone-Salmeterol (ADVAIR DISKUS) 250-50 MCG/DOSE AEPB Inhale 1 puff into the lungs every 12 (twelve) hours as needed (shortness of breath). Reported on 02/06/2016    . insulin detemir (LEVEMIR) 100 UNIT/ML injection Inject 36 Units into the skin at bedtime.     . isosorbide mononitrate (IMDUR) 30 MG 24 hr tablet TAKE 1 TABLET (30 MG TOTAL) BY MOUTH 2 (TWO) TIMES DAILY. 180 tablet 3  . linagliptin (TRADJENTA) 5 MG TABS tablet Take 1 tablet (5 mg total) by mouth daily. 30 tablet 3    . methimazole (TAPAZOLE) 10 MG tablet Take 10 mg by mouth daily.     . metolazone (ZAROXOLYN) 2.5 MG tablet Take 2.5 mg by mouth daily as needed (edema). Reported on 02/06/2016    . montelukast (SINGULAIR) 10 MG tablet Take 10 mg by mouth daily as needed (for shortness of breath or wheezing).     . rivaroxaban (XARELTO) 20 MG TABS tablet Take 1 tablet (20 mg total) by mouth daily with supper. 30 tablet 11  . sacubitril-valsartan (ENTRESTO) 49-51 MG Take 1 tablet by mouth 2 (two) times daily. 60 tablet 3  . spironolactone (ALDACTONE) 25 MG tablet Take 1/2 tablet by mouth once a day    . torsemide (DEMADEX) 20 MG tablet Take 40 mg (2 tabs) by mouth in the am and 20 mg (1 tab) in the pm     No current facility-administered medications for this visit.     Allergies:   Patient has no known allergies.   Social History:  The patient  reports that he has never smoked. He has never used smokeless tobacco. He reports that he drinks alcohol. He reports that he does not use drugs.   Family History:  The patient's family history includes Cancer in his father; Hypertension in his mother.    ROS:  Please see the history of present illness.   All other systems are reviewed and negative.    PHYSICAL EXAM: VS:  BP 132/64   Pulse 72   Ht 5\' 5"  (1.651 m)   Wt 285 lb (129.3 kg)   SpO2 96%   BMI 47.43 kg/m  , BMI Body mass index is 47.43 kg/m. GEN: overweight in no acute distress  HEENT: normal  Neck: no JVD, no carotid bruits, or masses Cardiac: RRR; no murmurs, rubs, or gallops,no edema  Respiratory:  Decreased BS at the bases, normal work of breathing GI: soft, nontender, nondistended, + BS MS: no deformity or atrophy  Skin: warm and dry, device pocket is well healed Neuro:  Strength and sensation are intact Psych: euthymic mood, full affect   Device interrogation is personsally reviewed today in detail.  See PaceArt for details.   Recent Labs: 09/03/2016: B Natriuretic Peptide  103.7 11/26/2016: Magnesium 2.1 11/27/2016: Hemoglobin 10.3; Platelets 275 12/03/2016: ALT 22; BUN 34; Creatinine, Ser 1.54; Potassium 4.0; Sodium 136; TSH 2.187    Lipid Panel     Component Value Date/Time   CHOL 181 04/09/2011 0323   TRIG 137 04/09/2011 0323   HDL 37 (L) 04/09/2011 0323   CHOLHDL 4.9 04/09/2011 0323   VLDL 27 04/09/2011 0323   LDLCALC 117 (H) 04/09/2011 0323     Wt Readings from Last 3 Encounters:  12/26/16 285 lb (129.3 kg)  12/03/16 283 lb 8  oz (128.6 kg)  11/27/16 288 lb 3.2 oz (130.7 kg)     Other studies Reviewed: Additional studies/ records that were reviewed today include: CHF clinic notes, hospital records      ASSESSMENT AND PLAN:  1.  Nonischemic CM/ chronic systolic dysfunction 2 gram sodium restriction and daily weights encouraged followed in ICM device clinic Importance of remote monitoring discussed  Normal BiV ICD function  See Pace Art report No changes today  2. Morbid obesity He is making some progress (311 lbs a year ago!) Weight loss is strongly advised.    3. VT No recent arrhythmias  4. AFib On xarelto Off amiodarone He did have atypical atrial flutter with an ICD shock in the setting of recent flu but no arrhythmias since.  He has severe LA enlargement which reduces my enthusiasm for ablation.  Lifestyle modification is again encouraged today  5. OSA Compliant with BIPAP   Current medicines are reviewed at length with the patient today.   The patient does not have concerns regarding his medicines.  The following changes were made today:  None  Return to see EP NP in 6 months [facility]  Signed, Hillis Range, MD  12/26/2016 9:07 AM     [facility] [address] [place] [address] [phone] (office) [phone] (fax)

## 2016-12-29 ENCOUNTER — Ambulatory Visit (HOSPITAL_COMMUNITY)
Admission: RE | Admit: 2016-12-29 | Discharge: 2016-12-29 | Disposition: A | Discharge status: Home / Self Care | Payer: Medicare Other | Source: Ambulatory Visit | Attending: Internal Medicine | Admitting: Internal Medicine

## 2016-12-29 VITALS — BP 120/64 | HR 64 | Wt 290.6 lb

## 2016-12-29 DIAGNOSIS — I509 Heart failure, unspecified: Secondary | ICD-10-CM | POA: Insufficient documentation

## 2016-12-29 LAB — BASIC METABOLIC PANEL
ANION GAP: 7 (ref 5–15)
BUN: 21 mg/dL — ABNORMAL HIGH (ref 6–20)
CALCIUM: 8.8 mg/dL — AB (ref 8.9–10.3)
CO2: 27 mmol/L (ref 22–32)
CREATININE: 1.16 mg/dL (ref 0.61–1.24)
Chloride: 105 mmol/L (ref 101–111)
GFR calc non Af Amer: >60 mL/min (ref >60)
Glucose, Bld: 155 mg/dL — ABNORMAL HIGH (ref 65–99)
Potassium: 4.1 mmol/L (ref 3.5–5.1)
SODIUM: 139 mmol/L (ref 135–145)

## 2016-12-29 LAB — DIGOXIN LEVEL: DIGOXIN LVL: 0.3 ng/mL — AB (ref 0.8–2.0)

## 2016-12-29 LAB — BRAIN NATRIURETIC PEPTIDE: B NATRIURETIC PEPTIDE 5: 92.1 pg/mL (ref 0.0–100.0)

## 2016-12-29 MED ORDER — SACUBITRIL-VALSARTAN 49-51 MG PO TABS
1.0000 | ORAL_TABLET | Freq: Two times a day (BID) | ORAL | 3 refills | Status: DC
Start: 2016-12-29 — End: 2018-02-05

## 2016-12-29 MED ORDER — HYDRALAZINE HCL 25 MG PO TABS: 25.0000 mg | ORAL_TABLET | Freq: Three times a day (TID) | ORAL | 3 refills | Status: DC

## 2016-12-29 MED ORDER — RIVAROXABAN 20 MG PO TABS: 20.0000 mg | ORAL_TABLET | Freq: Every day | ORAL | 3 refills | Status: DC

## 2016-12-29 NOTE — Patient Instructions (Addendum)
It was great to see you today!  Please take torsemide 2 tablets (40 mg) TWICE DAILY FOR 2 DAYS then resume 2 tablets (40 mg) in the morning and 1 tablet (20 mg) in the afternoon.   Please restart hydralazine 25 mg THREE TIMES DAILY. You can take 1/2 tablet of your current 50 mg tablets three times daily until you get your new prescription.   Labs today. We will call you with any abnormalities.   Please keep your appointment with the PA/NP on 04/02/17.

## 2016-12-29 NOTE — Progress Notes (Signed)
HF MD: Gary Hutchinson   HPI:  Gary Hutchinson is a 56 year old AA male with a history of chronic systolic heart failure, ICM s/p ICD, HTN, DM II, OSA on CPAP, history of ventricular tachycardia status post AICD placement in 2009, atrial fibrillation and morbid obesity.   He presents today for pharmacist-led HF medication titration. At his last HF clinic visit on 12/03/16, he was told to take metolazone 2.5 mg x 1 for fluid overload. He states that he has been compliant with his medication but does admit to missing his evening dose of torsemide 2 days ago. He states that he is still actively attempting to lose weight by exercising and eating more fruits and vegetables but did admit to eating larger portions of food this past weekend.    Optivol - fluid trend up but index below threshold. Thoracic impedence OK. No further Afib. Pt activity still between 1-2 hrs daily.     . Shortness of breath/dyspnea on exertion? No - still walking 4000-5000 steps at least 3-4 days/wk, limited by his knees . Orthopnea/PND? No . Edema? No . Lightheadedness/dizziness? No . Daily weights at home? Yes - stable ~281-284 lb . Blood pressure/heart rate monitoring at home? Yes - SBP 120-130 mmHg range  . Following low-sodium/fluid-restricted diet? Yes - drinks water with sugar free flavoring and diet soda but <2L/day; not adding table salt, still reading labels and trying to keep Na < 2000 mg/day  HF Medications: Bisoprolol 5 mg PO daily Digoxin 0.0625 mg PO QOD Metolazone 2.5 mg PO daily PRN weight gain/edema Entresto 49-51 mg PO BID Isosorbide mononitrate 30 mg PO BID Spironolactone 12.5 mg PO daily Torsemide 40 mg QAM and 20 mg QPM  Has the patient been experiencing any side effects to the medications prescribed?  no  Does the patient have any problems obtaining medications due to transportation or finances?   Yes - has Medicare Part D - verified with [facility] that his Entresto and Xarelto are each $8.35/mo but is enrolled  in [employer] if any are over $25/mo - sometimes says he has to wait to get enough money to pay for his medications - will send as 90 day supplies which he states are cheaper than 30 day supplies  Understanding of regimen: fair Understanding of indications: fair Potential of compliance: fair Patient understands to avoid NSAIDs. Patient understands to avoid decongestants.    Pertinent Lab Values: . 12/29/16: Serum creatinine 1.16 (BL 1.2-1.4), BUN 21, Potassium 4.1, Sodium 139, BNP 92, Digoxin 0.3   Vital Signs: . Weight: 290 lb (dry weight: 280-285 lb) . Blood pressure: 120/64 mmHg  . Heart rate: 64 bpm    Assessment: 1. Chronicsystolic CHF (EF 32-99% 05/2016), due to NICM s/p ICD Medtronic. NYHA class IIsymptoms.  - Volume status stable with slight trend up on Optivol (likely 2/2 missed torsemide dose 2 days ago)  - Torsemide 40 mg BID x 2 days then back to 40 mg qam/20 mg qpm and discussed importance of compliance  - Restart hydralazine 25 mg TID  - Continue bisoprolol 5 mg daily, digoxin 0.0625 mg QOD, Isosorbide mononitrate 30 mg BID, metolazone 2.5 mg PRN for weight gain/edema, Entresto 49-51 mg BID, spironolactone 12.5 mg daily  - Basic disease state pathophysiology, medication indication, mechanism and side effects reviewed at length with patient and he verbalized understanding 2. CKD stage III - Followed by nephrology - Creatinine much improved, new baseline ~1.2-1.4 3. HTN - Controlled - Continue current medications and add low dose hydralazine as  above 4. OSA  - Continue to wear CPAP nightly 5. PAF - S/P Succesful DC-CV 10/17/2015 - Continue Xarelto 20 mg daily 6. Obesity: - Encouraged continued weight loss and to watch portion sizes  Plan: 1) Medication changes: Based on clinical presentation, vital signs and recent labs will increase torsemide to 40 mg BID x 2 days and restart hydralazine 25 mg TID 2) Labs: BMET/BNP/Dig level today 3) Follow-up:  04/02/17 with NP/PA   Cicero Duck K. Bonnye Fava, PharmD, BCPS, CPP Clinical Pharmacist Pager: [phone] Phone: [phone] 12/29/2016 1:09 PM  Agree with above.   Arvilla Meres, MD  6:09 PM

## 2016-12-30 ENCOUNTER — Other Ambulatory Visit (HOSPITAL_COMMUNITY): Payer: Self-pay | Admitting: Cardiology

## 2016-12-30 MED ORDER — BISOPROLOL FUMARATE 5 MG PO TABS
5.0000 mg | ORAL_TABLET | Freq: Every day | ORAL | 3 refills | Status: DC
Start: 2016-12-30 — End: 2017-02-16

## 2017-01-01 ENCOUNTER — Ambulatory Visit (INDEPENDENT_AMBULATORY_CARE_PROVIDER_SITE_OTHER): Payer: Medicare Other

## 2017-01-01 DIAGNOSIS — I509 Heart failure, unspecified: Secondary | ICD-10-CM

## 2017-01-01 DIAGNOSIS — Z95810 Presence of automatic (implantable) cardiac defibrillator: Secondary | ICD-10-CM | POA: Diagnosis not present

## 2017-01-01 NOTE — Progress Notes (Signed)
EPIC Encounter for ICM Monitoring  Patient Name: Gary Hutchinson is a 56 y.o. male Date: 01/01/2017 Primary Care Physican: Precious Reel, MD Primary Cardiologist:[name] Electrophysiologist: Allred Dry Weight:290 lbs BiV Pacing: 98.3%         Heart Failure questions reviewed, pt symptomatic with weight gain of 3 pounds.   Thoracic impedance abnormal suggesting fluid accumulation since 12/26/2016.  Prescribed and confirmed dosage: Torsemide 20 mg 2 tablets (40 mg total) in am and 1 tablet (20 mg total) every pm.  Metolazone 2.5 mg 1 tablet as needed  Labs: 09/10/2016 Creatinine 1.36, BUN 20, Potassium 4.5, Sodium 139, EGFR 57->60 09/03/2016 Creatinine 1.39, BUN 34, Potassium 4.8, Sodium 136, EGFR 56->60  08/06/2016 Creatinine 1.41, BUN 21, Potassium 4.7, Sodium 131, EGFR 55->60  06/24/2016 Creatinine 1.16, BUN 27, Potassium 3.7, Sodium 137, EGFR >60  12/13/2015 Creatinine 1.47, BUN 25, Potassium 4.2, Sodium 136, EGFR 52->60  11/21/2015 Creatinine 1.38, BUN 32, Potassium 3.9, Sodium 137  11/17/2015 Creatinine 2.04, BUN 50, Potassium 3.2, Sodium 138, EGFR 35-41  11/09/2015 Creatinine 1.69, BUN 35, Potassium 3.9, Sodium 136  10/25/2015 Creatinine 1.91, BUN 44, Potassium 3.1, Sodium 135, EGFR 38-44  10/19/2015 Creatinine 2.15, BUN 40, Potassium 3.6, Sodium 137, EGFR 33-38 10/18/2015 Creatinine 1.85, BUN 37, Potassium 3.9, Sodium 138, EGFR 40-46  10/17/2015 Creatinine 1.78, BUN 40, Potassium 3.8, Sodium 142, EGFR 42-48 10/16/2015 Creatinine 1.93, BUN 51, Potassium 3.4, Sodium 140, EGFR 38-44   Recommendations:  He reported Dr Gillermina Hu office had instructed him to increase Torsemide to 2 tabs bid x 2 days but he still has fluid. He plans to take Metolazone tomorrow as prescribed.   Follow-up plan: ICM clinic phone appointment on 01/08/2017.  Copy of ICM check sent to primary cardiologist and device physician.   3 month ICM trend: 01/01/2017   1 Year ICM trend:      [name], RN 01/01/2017 12:37 PM

## 2017-01-08 ENCOUNTER — Ambulatory Visit (INDEPENDENT_AMBULATORY_CARE_PROVIDER_SITE_OTHER): Payer: Self-pay

## 2017-01-08 DIAGNOSIS — Z95810 Presence of automatic (implantable) cardiac defibrillator: Secondary | ICD-10-CM

## 2017-01-08 DIAGNOSIS — I5022 Chronic systolic (congestive) heart failure: Secondary | ICD-10-CM

## 2017-01-08 NOTE — Progress Notes (Signed)
EPIC Encounter for ICM Monitoring  Patient Name: Gary Hutchinson is a 56 y.o. male Date: 01/08/2017 Primary Care Physican: Precious Reel, MD Primary Cardiologist:[name] Electrophysiologist: Allred Dry Weight:282lbs BiV Pacing: 97.8%        Heart Failure questions reviewed, pt asymptomatic.   Thoracic impedance continues to be abnormal suggesting fluid accumulation after taking Metolazone on 01/02/2017 but impedance has improved.    Prescribed and confirmed dosage: Torsemide 20 mg 2 tablets (40 mg total) in am and 1 tablet (20 mg total) every pm.  Metolazone 2.5 mg 1 tablet as needed.  Labs: 09/10/2016 Creatinine 1.36, BUN 20, Potassium 4.5, Sodium 139, EGFR 57->60 09/03/2016 Creatinine 1.39, BUN 34, Potassium 4.8, Sodium 136, EGFR 56->60  08/06/2016 Creatinine 1.41, BUN 21, Potassium 4.7, Sodium 131, EGFR 55->60  06/24/2016 Creatinine 1.16, BUN 27, Potassium 3.7, Sodium 137, EGFR >60  12/13/2015 Creatinine 1.47, BUN 25, Potassium 4.2, Sodium 136, EGFR 52->60  11/21/2015 Creatinine 1.38, BUN 32, Potassium 3.9, Sodium 137  11/17/2015 Creatinine 2.04, BUN 50, Potassium 3.2, Sodium 138, EGFR 35-41  11/09/2015 Creatinine 1.69, BUN 35, Potassium 3.9, Sodium 136  10/25/2015 Creatinine 1.91, BUN 44, Potassium 3.1, Sodium 135, EGFR 38-44  10/19/2015 Creatinine 2.15, BUN 40, Potassium 3.6, Sodium 137, EGFR 33-38 10/18/2015 Creatinine 1.85, BUN 37, Potassium 3.9, Sodium 138, EGFR 40-46  10/17/2015 Creatinine 1.78, BUN 40, Potassium 3.8, Sodium 142, EGFR 42-48 10/16/2015 Creatinine 1.93, BUN 51, Potassium 3.4, Sodium 140, EGFR 38-44   Recommendations:  Patient reported he has not been limiting salt or fluid intake.  Reminded him of limitations and encouraged him to make dietary changes.  He has Metolazone he will take.    Follow-up plan: ICM clinic phone appointment on 01/20/2017 to recheck fluid levels.    Copy of ICM check sent to primary cardiologist and device physician.   3 month  ICM trend: 01/08/2017   1 Year ICM trend:      [name], RN 01/08/2017 2:11 PM

## 2017-01-20 ENCOUNTER — Ambulatory Visit (INDEPENDENT_AMBULATORY_CARE_PROVIDER_SITE_OTHER): Payer: Self-pay

## 2017-01-20 ENCOUNTER — Telehealth: Payer: Self-pay

## 2017-01-20 DIAGNOSIS — Z95810 Presence of automatic (implantable) cardiac defibrillator: Secondary | ICD-10-CM

## 2017-01-20 DIAGNOSIS — I5022 Chronic systolic (congestive) heart failure: Secondary | ICD-10-CM

## 2017-01-20 NOTE — Progress Notes (Signed)
Patient returned call. He stated he took a Metolazone last night because he knew he was retaining fluid and has not been following low salt diet.  He said he may take another Metolazone later in the week if needed.  He would like to send remote transmission on 01/26/2017 to recheck fluid levels.   ICM transmission rescheduled from 01/29/2017 to 01/26/2017.

## 2017-01-20 NOTE — Telephone Encounter (Signed)
Remote ICM transmission received.  Attempted patient call and left message to return call.   

## 2017-01-20 NOTE — Progress Notes (Signed)
EPIC Encounter for ICM Monitoring  Patient Name: Gary Hutchinson is a 56 y.o. male Date: 01/20/2017 Primary Care Physican: Precious Reel, MD Primary Cardiologist:[name] Electrophysiologist: Allred Dry Weight:Last known weight 282lbs BiV Pacing: 97.9%      Attempted call to patient and unable to reach.  Left message to return call.  Transmission reviewed.    Thoracic impedance abnormal suggesting fluid accumulation since 01/11/2017.  Prescribed and confirmed dosage: Torsemide 20 mg 2 tablets (40 mg total) in am and 1 tablet (20 mg total) every pm. Metolazone 2.5 mg 1 tablet as needed.  Labs: 09/10/2016 Creatinine 1.36, BUN 20, Potassium 4.5, Sodium 139, EGFR 57->60 09/03/2016 Creatinine 1.39, BUN 34, Potassium 4.8, Sodium 136, EGFR 56->60  08/06/2016 Creatinine 1.41, BUN 21, Potassium 4.7, Sodium 131, EGFR 55->60  06/24/2016 Creatinine 1.16, BUN 27, Potassium 3.7, Sodium 137, EGFR >60  12/13/2015 Creatinine 1.47, BUN 25, Potassium 4.2, Sodium 136, EGFR 52->60  11/21/2015 Creatinine 1.38, BUN 32, Potassium 3.9, Sodium 137  11/17/2015 Creatinine 2.04, BUN 50, Potassium 3.2, Sodium 138, EGFR 35-41  11/09/2015 Creatinine 1.69, BUN 35, Potassium 3.9, Sodium 136  10/25/2015 Creatinine 1.91, BUN 44, Potassium 3.1, Sodium 135, EGFR 38-44  10/19/2015 Creatinine 2.15, BUN 40, Potassium 3.6, Sodium 137, EGFR 33-38 10/18/2015 Creatinine 1.85, BUN 37, Potassium 3.9, Sodium 138, EGFR 40-46  10/17/2015 Creatinine 1.78, BUN 40, Potassium 3.8, Sodium 142, EGFR 42-48 10/16/2015 Creatinine 1.93, BUN 51, Potassium 3.4, Sodium 140, EGFR 38-44   Recommendations: NONE - Unable to reach patient   Follow-up plan: ICM clinic phone appointment on 01/29/2017.  Office appointment scheduled on 04/02/2017 with HF clinic.  Copy of ICM check sent to Dr Haroldine Laws and Dr Rayann Heman for review and recommendations.  Patient is taking Metolazone as needed.   3 month ICM trend: 01/20/2017   1 Year ICM trend:       [name], RN 01/20/2017 8:56 AM

## 2017-01-26 ENCOUNTER — Ambulatory Visit (INDEPENDENT_AMBULATORY_CARE_PROVIDER_SITE_OTHER): Payer: Self-pay

## 2017-01-26 ENCOUNTER — Telehealth: Payer: Self-pay | Admitting: Cardiology

## 2017-01-26 DIAGNOSIS — I5022 Chronic systolic (congestive) heart failure: Secondary | ICD-10-CM

## 2017-01-26 DIAGNOSIS — Z95810 Presence of automatic (implantable) cardiac defibrillator: Secondary | ICD-10-CM

## 2017-01-26 NOTE — Telephone Encounter (Signed)
Spoke with pt and reminded pt of remote transmission that is due today. Pt verbalized understanding.   

## 2017-01-27 NOTE — Progress Notes (Signed)
EPIC Encounter for ICM Monitoring  Patient Name: Gary Hutchinson is a 56 y.o. male Date: 01/27/2017 Primary Care Physican: Precious Reel, MD Primary Cardiologist:[name] Electrophysiologist: Allred Dry Weight:284lbs BiV Pacing: 98.3%              Heart Failure questions reviewed, pt symptomatic with 2-3 lb weight gain.   Thoracic impedance slightly improved since after he took 1 metolazone on 01/19/2017.  He did not follow up with a 2nd Metolazone as planned but will take one tomorrow since impedance continues to be abnormal suggesting fluid accumulation.     Prescribed and confirmed dosage: Torsemide 20 mg 2 tablets (40 mg total) in am and 1 tablet (20 mg total) every pm. Metolazone 2.5 mg 1 tablet as needed.  Labs: 09/10/2016 Creatinine 1.36, BUN 20, Potassium 4.5, Sodium 139, EGFR 57->60 09/03/2016 Creatinine 1.39, BUN 34, Potassium 4.8, Sodium 136, EGFR 56->60  08/06/2016 Creatinine 1.41, BUN 21, Potassium 4.7, Sodium 131, EGFR 55->60  06/24/2016 Creatinine 1.16, BUN 27, Potassium 3.7, Sodium 137, EGFR >60  12/13/2015 Creatinine 1.47, BUN 25, Potassium 4.2, Sodium 136, EGFR 52->60  11/21/2015 Creatinine 1.38, BUN 32, Potassium 3.9, Sodium 137  11/17/2015 Creatinine 2.04, BUN 50, Potassium 3.2, Sodium 138, EGFR 35-41  11/09/2015 Creatinine 1.69, BUN 35, Potassium 3.9, Sodium 136  10/25/2015 Creatinine 1.91, BUN 44, Potassium 3.1, Sodium 135, EGFR 38-44  10/19/2015 Creatinine 2.15, BUN 40, Potassium 3.6, Sodium 137, EGFR 33-38 10/18/2015 Creatinine 1.85, BUN 37, Potassium 3.9, Sodium 138, EGFR 40-46  10/17/2015 Creatinine 1.78, BUN 40, Potassium 3.8, Sodium 142, EGFR 42-48 10/16/2015 Creatinine 1.93, BUN 51, Potassium 3.4, Sodium 140, EGFR 38-44   Recommendations: Patient reported he will take a Metolazone 2.5 mg 1 tablet tomorrow.    Follow-up plan: ICM clinic phone appointment on 01/30/2017.   Copy of ICM check sent to primary cardiologist and device physician.   3 month  ICM trend: 01/26/2017   1 Year ICM trend:      [name], RN 01/27/2017 1:54 PM

## 2017-01-30 ENCOUNTER — Telehealth: Payer: Self-pay

## 2017-01-30 ENCOUNTER — Ambulatory Visit (INDEPENDENT_AMBULATORY_CARE_PROVIDER_SITE_OTHER): Payer: Self-pay

## 2017-01-30 DIAGNOSIS — Z95810 Presence of automatic (implantable) cardiac defibrillator: Secondary | ICD-10-CM

## 2017-01-30 DIAGNOSIS — I5022 Chronic systolic (congestive) heart failure: Secondary | ICD-10-CM

## 2017-01-30 NOTE — Progress Notes (Signed)
EPIC Encounter for ICM Monitoring  Patient Name: Gary Hutchinson is a 56 y.o. male Date: 01/30/2017 Primary Care Physican: Precious Reel, MD Primary Cardiologist:[name] Electrophysiologist: Allred Dry Weight:Last known weight 284lbs BiV Pacing: 98%       Attempted call to patient and unable to reach.  Left detailed message regarding transmission.  Transmission reviewed.    Thoracic impedance returned to normal after taking Metolazone as prescribed.  Prescribed and confirmed dosage: Torsemide 20 mg 2 tablets (40 mg total) in am and 1 tablet (20 mg total) every pm. Metolazone 2.5 mg 1 tablet as needed.  Labs: 09/10/2016 Creatinine 1.36, BUN 20, Potassium 4.5, Sodium 139, EGFR 57->60 09/03/2016 Creatinine 1.39, BUN 34, Potassium 4.8, Sodium 136, EGFR 56->60  08/06/2016 Creatinine 1.41, BUN 21, Potassium 4.7, Sodium 131, EGFR 55->60  06/24/2016 Creatinine 1.16, BUN 27, Potassium 3.7, Sodium 137, EGFR >60  12/13/2015 Creatinine 1.47, BUN 25, Potassium 4.2, Sodium 136, EGFR 52->60  11/21/2015 Creatinine 1.38, BUN 32, Potassium 3.9, Sodium 137  11/17/2015 Creatinine 2.04, BUN 50, Potassium 3.2, Sodium 138, EGFR 35-41  11/09/2015 Creatinine 1.69, BUN 35, Potassium 3.9, Sodium 136  10/25/2015 Creatinine 1.91, BUN 44, Potassium 3.1, Sodium 135, EGFR 38-44  10/19/2015 Creatinine 2.15, BUN 40, Potassium 3.6, Sodium 137, EGFR 33-38 10/18/2015 Creatinine 1.85, BUN 37, Potassium 3.9, Sodium 138, EGFR 40-46  10/17/2015 Creatinine 1.78, BUN 40, Potassium 3.8, Sodium 142, EGFR 42-48 10/16/2015 Creatinine 1.93, BUN 51, Potassium 3.4, Sodium 140, EGFR 38-44   Recommendations: Left voice mail with ICM number and encouraged to call for fluid symptoms.  Follow-up plan: ICM clinic phone appointment on 02/16/2017.    Copy of ICM check sent to primary cardiologist and device physician.   3 month ICM trend: 01/30/2017   1 Year ICM trend:      [name], RN 01/30/2017 9:27 AM

## 2017-01-30 NOTE — Telephone Encounter (Signed)
Remote ICM transmission received.  Attempted patient call and left detailed message regarding transmission and next ICM scheduled for 02/16/2017.  Advised to return call for any fluid symptoms or questions.

## 2017-02-06 ENCOUNTER — Other Ambulatory Visit (HOSPITAL_COMMUNITY): Payer: Self-pay | Admitting: Internal Medicine

## 2017-02-16 ENCOUNTER — Ambulatory Visit (INDEPENDENT_AMBULATORY_CARE_PROVIDER_SITE_OTHER): Payer: Medicare Other

## 2017-02-16 ENCOUNTER — Other Ambulatory Visit (HOSPITAL_COMMUNITY): Payer: Self-pay | Admitting: *Deleted

## 2017-02-16 DIAGNOSIS — I5022 Chronic systolic (congestive) heart failure: Secondary | ICD-10-CM

## 2017-02-16 DIAGNOSIS — Z95810 Presence of automatic (implantable) cardiac defibrillator: Secondary | ICD-10-CM

## 2017-02-16 MED ORDER — BISOPROLOL FUMARATE 5 MG PO TABS: 5.0000 mg | ORAL_TABLET | Freq: Every day | ORAL | 3 refills | Status: DC

## 2017-02-16 NOTE — Progress Notes (Signed)
EPIC Encounter for ICM Monitoring  Patient Name: Gary Hutchinson is a 56 y.o. male Date: 02/16/2017 Primary Care Physican: Shon Baton, MD Primary Cardiologist:[name] Electrophysiologist: Allred Dry Weight:286lbs BiV Pacing: 98.2%         Heart Failure questions reviewed, pt symptomatic and reporting a 4 lb weight gain in the last week   Thoracic impedance abnormal suggesting fluid accumulation but is trending back toward baseline. Impedance with multiple valleys indicating pattern of fluid accumulation followed return to baseline which is consistent with taking Metolazone PRN.   He has not been taking Torsemide on a consistent basis as prescribed.    Prescribed and confirmed dosage: Torsemide 20 mg 2 tablets (40 mg total) in AM and 1 tablet (20 mg total) every PM. Metolazone 2.5 mg 1 tablet as needed.  Labs: 12/29/2016 Creatinine 1.16, BUN 21, Potassium 4.1, Sodium 139, EGFR >60 12/03/2016 Creatinine 1.54, BUN 34, Potassium 4.0, Sodium 136, EGFR 49-57  11/27/2016 Creatinine 1.38, BUN 26, Potassium 3.8, Sodium 138, EGFR 56->60  11/26/2016 Creatinine 1.97, BUN 44, Potassium 3.5, Sodium 138, EGFR 36-42  11/25/2016 Creatinine 2.32, BUN 48, Potassium 3.5, Sodium 135, EGFR 30-35  09/10/2016 Creatinine 1.36, BUN 20, Potassium 4.5, Sodium 139, EGFR 57->60 09/03/2016 Creatinine 1.39, BUN 34, Potassium 4.8, Sodium 136, EGFR 56->60  08/06/2016 Creatinine 1.41, BUN 21, Potassium 4.7, Sodium 131, EGFR 55->60  06/24/2016 Creatinine 1.16, BUN 27, Potassium 3.7, Sodium 137, EGFR >60  12/13/2015 Creatinine 1.47, BUN 25, Potassium 4.2, Sodium 136, EGFR 52->60  11/21/2015 Creatinine 1.38, BUN 32, Potassium 3.9, Sodium 137  11/17/2015 Creatinine 2.04, BUN 50, Potassium 3.2, Sodium 138, EGFR 35-41  11/09/2015 Creatinine 1.69, BUN 35, Potassium 3.9, Sodium 136  10/25/2015 Creatinine 1.91, BUN 44, Potassium 3.1, Sodium 135, EGFR 38-44  10/19/2015 Creatinine 2.15, BUN 40, Potassium 3.6, Sodium 137,  EGFR 33-38 10/18/2015 Creatinine 1.85, BUN 37, Potassium 3.9, Sodium 138, EGFR 40-46  10/17/2015 Creatinine 1.78, BUN 40, Potassium 3.8, Sodium 142, EGFR 42-48 10/16/2015 Creatinine 1.93, BUN 51, Potassium 3.4, Sodium 140, EGFR 38-44   Recommendations: No changes. Discussed effects of not following low salt diet and limiting fluid intake.  He admits to not taking Torsemide consistently and not following dietary/fluid limitations. Discussed effects of diuretics on kidney function.  Advised the better he can keep his fluid levels balanced the better it is for his heart and kidneys. Advised to take Metolazone as prescribed.  Follow-up plan: ICM clinic phone appointment on 02/20/2017 to recheck fluid levels.    Copy of ICM check sent to primary cardiologist and device physician.   3 month ICM trend: 02/16/2017   1 Year ICM trend:      [name], RN 02/16/2017 11:09 AM

## 2017-02-20 ENCOUNTER — Ambulatory Visit (INDEPENDENT_AMBULATORY_CARE_PROVIDER_SITE_OTHER): Payer: Self-pay

## 2017-02-20 DIAGNOSIS — Z95810 Presence of automatic (implantable) cardiac defibrillator: Secondary | ICD-10-CM

## 2017-02-20 DIAGNOSIS — I5022 Chronic systolic (congestive) heart failure: Secondary | ICD-10-CM

## 2017-02-20 NOTE — Progress Notes (Signed)
EPIC Encounter for ICM Monitoring  Patient Name: Gary Hutchinson is a 56 y.o. male Date: 02/20/2017 Primary Care Physican: Russo, John, MD Primary Cardiologist:[name] Electrophysiologist: Allred Dry Weight:284lbs BiV Pacing: 98.3%                                          Heart Failure questions reviewed, pt asymptomatic    Thoracic impedance returned to normal     Prescribed and confirmed dosage: Torsemide 20 mg 2 tablets (40 mg total) in AM and 1 tablet (20 mg total) every PM. Metolazone 2.5 mg 1 tablet as needed.  Labs: 12/29/2016 Creatinine 1.16, BUN 21, Potassium 4.1, Sodium 139, EGFR >60 12/03/2016 Creatinine 1.54, BUN 34, Potassium 4.0, Sodium 136, EGFR 49-57  11/27/2016 Creatinine 1.38, BUN 26, Potassium 3.8, Sodium 138, EGFR 56->60  11/26/2016 Creatinine 1.97, BUN 44, Potassium 3.5, Sodium 138, EGFR 36-42  11/25/2016 Creatinine 2.32, BUN 48, Potassium 3.5, Sodium 135, EGFR 30-35  09/10/2016 Creatinine 1.36, BUN 20, Potassium 4.5, Sodium 139, EGFR 57->60 09/03/2016 Creatinine 1.39, BUN 34, Potassium 4.8, Sodium 136, EGFR 56->60  08/06/2016 Creatinine 1.41, BUN 21, Potassium 4.7, Sodium 131, EGFR 55->60  06/24/2016 Creatinine 1.16, BUN 27, Potassium 3.7, Sodium 137, EGFR >60  12/13/2015 Creatinine 1.47, BUN 25, Potassium 4.2, Sodium 136, EGFR 52->60  11/21/2015 Creatinine 1.38, BUN 32, Potassium 3.9, Sodium 137  11/17/2015 Creatinine 2.04, BUN 50, Potassium 3.2, Sodium 138, EGFR 35-41  11/09/2015 Creatinine 1.69, BUN 35, Potassium 3.9, Sodium 136  10/25/2015 Creatinine 1.91, BUN 44, Potassium 3.1, Sodium 135, EGFR 38-44  10/19/2015 Creatinine 2.15, BUN 40, Potassium 3.6, Sodium 137, EGFR 33-38 10/18/2015 Creatinine 1.85, BUN 37, Potassium 3.9, Sodium 138, EGFR 40-46  10/17/2015 Creatinine 1.78, BUN 40, Potassium 3.8, Sodium 142, EGFR 42-48 10/16/2015 Creatinine 1.93, BUN 51, Potassium 3.4, Sodium 140, EGFR 38-44   Recommendations: No changes. Discussed to limit  salt intake to 2000 mg/day and fluid intake to < 2 liters/day.  Encouraged to call for fluid symptoms or use local ER for any urgent symptoms.  Follow-up plan: ICM clinic phone appointment on 03/30/2017.  Office appointment scheduled on 04/02/2017 with HF clinic.  Copy of ICM check sent to primary cardiologist and device physician.   3 month ICM trend: 5//18/2018   1 Year ICM trend:       S , RN 02/20/2017 9:21 AM    

## 2017-03-27 ENCOUNTER — Telehealth (HOSPITAL_COMMUNITY): Payer: Self-pay | Admitting: *Deleted

## 2017-03-27 ENCOUNTER — Telehealth (HOSPITAL_COMMUNITY): Payer: Self-pay | Admitting: Pharmacist

## 2017-03-27 NOTE — Telephone Encounter (Signed)
Pt called, he is out of Xarelto and request samples to cover him a few days until her can get it renewed.  Samples left at front desk for him to p/u.  Medication Samples have been provided to the patient.  Drug name: Xarelto       Strength: 20mg         Qty: 2  LOT: [id number]  Exp.Date: 3/21  Dosing instructions: 1 tab daily  The patient has been instructed regarding the correct time, dose, and frequency of taking this medication, including desired effects and most common side effects.   Gary Hutchinson 10:08 AM 03/27/2017

## 2017-03-27 NOTE — Telephone Encounter (Signed)
Patient called and LVM requesting Xarelto samples. Called patient back and patient states that he already spoke with Herbert Seta, RN and she stated she would have samples ready for him at the front desk. Patient verified that Entresto and Xarelto prescriptions are still $8.35/month at [facility] but he cannot afford them until the first of the month. Explained to patient that unfortunately we cannot be a reliable supply of medication as samples are not always available, but we're happy to help when needed and able.   Allena Katz, Pharm.D. PGY1 Pharmacy Resident 6/22/201810:34 AM Pager [phone]

## 2017-03-30 ENCOUNTER — Ambulatory Visit (INDEPENDENT_AMBULATORY_CARE_PROVIDER_SITE_OTHER): Payer: Medicare Other | Admitting: *Deleted

## 2017-03-30 ENCOUNTER — Telehealth: Payer: Self-pay

## 2017-03-30 DIAGNOSIS — Z95810 Presence of automatic (implantable) cardiac defibrillator: Secondary | ICD-10-CM | POA: Diagnosis not present

## 2017-03-30 DIAGNOSIS — I428 Other cardiomyopathies: Secondary | ICD-10-CM

## 2017-03-30 DIAGNOSIS — I5022 Chronic systolic (congestive) heart failure: Secondary | ICD-10-CM | POA: Diagnosis not present

## 2017-03-30 NOTE — Telephone Encounter (Signed)
Remote ICM transmission received.  Attempted patient call and left message to return call.   

## 2017-03-30 NOTE — Progress Notes (Signed)
EPIC Encounter for ICM Monitoring  Patient Name: Gary Hutchinson is a 56 y.o. male Date: 03/30/2017 Primary Care Physican: Shon Baton, MD Primary Cardiologist:[name] Electrophysiologist: Allred Dry Weight:Last known weight 284lbs BiV Pacing: 98.2%       Attempted call to patient and unable to reach.  Left message to return call.  Transmission reviewed.    Thoracic impedance at baseline today. Impedance with multiple valleys indicating fluid accumulation pattern followed return to baseline.  Prescribed and confirmed dosage: Torsemide 20 mg 2 tablets (40 mg total) in AMand 1 tablet (20 mg total) every PM. Metolazone 2.5 mg 1 tablet as needed.  Labs: 12/29/2016 Creatinine 1.16, BUN 21, Potassium 4.1, Sodium 139, EGFR >60 12/03/2016 Creatinine 1.54, BUN 34, Potassium 4.0, Sodium 136, EGFR 49-57  11/27/2016 Creatinine 1.38, BUN 26, Potassium 3.8, Sodium 138, EGFR 56->60  11/26/2016 Creatinine 1.97, BUN 44, Potassium 3.5, Sodium 138, EGFR 36-42  02/20/2018Creatinine 2.32, BUN 48, Potassium 3.5, Sodium 135, EGFR 30-35  09/10/2016 Creatinine 1.36, BUN 20, Potassium 4.5, Sodium 139, EGFR 57->60 09/03/2016 Creatinine 1.39, BUN 34, Potassium 4.8, Sodium 136, EGFR 56->60  08/06/2016 Creatinine 1.41, BUN 21, Potassium 4.7, Sodium 131, EGFR 55->60  06/24/2016 Creatinine 1.16, BUN 27, Potassium 3.7, Sodium 137, EGFR >60   Recommendations: NONE - Unable to reach patient   Follow-up plan: ICM clinic phone appointment on 04/30/2017.  Office appointment scheduled with HF clinic 04/02/2017.  Copy of ICM check sent to device physician.   3 month ICM trend: 03/30/2017   1 Year ICM trend:      [name], RN 03/30/2017 1:51 PM

## 2017-03-30 NOTE — Progress Notes (Signed)
Remote ICD transmission.   

## 2017-04-01 ENCOUNTER — Encounter: Payer: Self-pay | Admitting: Cardiology

## 2017-04-01 LAB — CUP PACEART REMOTE DEVICE CHECK
Battery Remaining Longevity: 97 mo
Brady Statistic AP VS Percent: 0.1 %
Brady Statistic AS VP Percent: 93.72 %
Brady Statistic AS VS Percent: 1.48 %
Brady Statistic RA Percent Paced: 4.8 %
Date Time Interrogation Session: 20180625073424
HighPow Impedance: 86 Ohm
Implantable Lead Implant Date: 20090901
Implantable Lead Implant Date: 20170210
Implantable Lead Implant Date: 20170210
Implantable Lead Model: 4598
Implantable Pulse Generator Implant Date: 20170210
Lead Channel Impedance Value: 418 Ohm
Lead Channel Impedance Value: 418 Ohm
Lead Channel Impedance Value: 418 Ohm
Lead Channel Impedance Value: 475 Ohm
Lead Channel Impedance Value: 532 Ohm
Lead Channel Impedance Value: 665 Ohm
Lead Channel Impedance Value: 836 Ohm
Lead Channel Impedance Value: 836 Ohm
Lead Channel Impedance Value: 874 Ohm
Lead Channel Pacing Threshold Amplitude: 0.75 V
Lead Channel Pacing Threshold Amplitude: 1 V
Lead Channel Pacing Threshold Pulse Width: 0.4 ms
Lead Channel Pacing Threshold Pulse Width: 0.4 ms
Lead Channel Pacing Threshold Pulse Width: 0.4 ms
Lead Channel Sensing Intrinsic Amplitude: 14.375 mV
Lead Channel Sensing Intrinsic Amplitude: 3.875 mV
Lead Channel Sensing Intrinsic Amplitude: 3.875 mV
Lead Channel Setting Pacing Amplitude: 1.5 V
Lead Channel Setting Pacing Pulse Width: 0.4 ms
Lead Channel Setting Pacing Pulse Width: 0.4 ms
Lead Channel Setting Sensing Sensitivity: 0.3 mV
MDC IDC LEAD LOCATION: 753858
MDC IDC LEAD LOCATION: 753859
MDC IDC LEAD LOCATION: 753860
MDC IDC MSMT BATTERY VOLTAGE: 3 V
MDC IDC MSMT LEADCHNL LV IMPEDANCE VALUE: 475 Ohm
MDC IDC MSMT LEADCHNL LV IMPEDANCE VALUE: 532 Ohm
MDC IDC MSMT LEADCHNL LV IMPEDANCE VALUE: 760 Ohm
MDC IDC MSMT LEADCHNL RV IMPEDANCE VALUE: 361 Ohm
MDC IDC MSMT LEADCHNL RV PACING THRESHOLD AMPLITUDE: 1 V
MDC IDC MSMT LEADCHNL RV SENSING INTR AMPL: 14.375 mV
MDC IDC SET LEADCHNL LV PACING AMPLITUDE: 2 V
MDC IDC SET LEADCHNL RV PACING AMPLITUDE: 2 V
MDC IDC STAT BRADY AP VP PERCENT: 4.7 %
MDC IDC STAT BRADY RV PERCENT PACED: 4.06 %

## 2017-04-02 ENCOUNTER — Encounter (HOSPITAL_COMMUNITY): Payer: Medicare Other

## 2017-04-13 ENCOUNTER — Ambulatory Visit (HOSPITAL_COMMUNITY)
Admission: RE | Admit: 2017-04-13 | Discharge: 2017-04-13 | Disposition: A | Discharge status: Home / Self Care | Payer: Medicare Other | Source: Ambulatory Visit | Attending: Internal Medicine | Admitting: Internal Medicine

## 2017-04-13 ENCOUNTER — Encounter (HOSPITAL_COMMUNITY): Payer: Self-pay

## 2017-04-13 VITALS — BP 140/80 | HR 82 | Wt 291.2 lb

## 2017-04-13 DIAGNOSIS — I480 Paroxysmal atrial fibrillation: Secondary | ICD-10-CM | POA: Diagnosis not present

## 2017-04-13 DIAGNOSIS — Z794 Long term (current) use of insulin: Secondary | ICD-10-CM | POA: Diagnosis not present

## 2017-04-13 DIAGNOSIS — Z95810 Presence of automatic (implantable) cardiac defibrillator: Secondary | ICD-10-CM | POA: Diagnosis not present

## 2017-04-13 DIAGNOSIS — I10 Essential (primary) hypertension: Secondary | ICD-10-CM | POA: Diagnosis not present

## 2017-04-13 DIAGNOSIS — N183 Chronic kidney disease, stage 3 (moderate): Secondary | ICD-10-CM | POA: Diagnosis not present

## 2017-04-13 DIAGNOSIS — G4733 Obstructive sleep apnea (adult) (pediatric): Secondary | ICD-10-CM | POA: Diagnosis not present

## 2017-04-13 DIAGNOSIS — I5022 Chronic systolic (congestive) heart failure: Secondary | ICD-10-CM | POA: Diagnosis not present

## 2017-04-13 DIAGNOSIS — Z6841 Body Mass Index (BMI) 40.0 and over, adult: Secondary | ICD-10-CM | POA: Diagnosis not present

## 2017-04-13 DIAGNOSIS — I130 Hypertensive heart and chronic kidney disease with heart failure and stage 1 through stage 4 chronic kidney disease, or unspecified chronic kidney disease: Secondary | ICD-10-CM | POA: Diagnosis not present

## 2017-04-13 DIAGNOSIS — Z79899 Other long term (current) drug therapy: Secondary | ICD-10-CM | POA: Insufficient documentation

## 2017-04-13 DIAGNOSIS — I428 Other cardiomyopathies: Secondary | ICD-10-CM

## 2017-04-13 DIAGNOSIS — Z7901 Long term (current) use of anticoagulants: Secondary | ICD-10-CM | POA: Insufficient documentation

## 2017-04-13 DIAGNOSIS — E1122 Type 2 diabetes mellitus with diabetic chronic kidney disease: Secondary | ICD-10-CM | POA: Diagnosis not present

## 2017-04-13 LAB — BASIC METABOLIC PANEL
Anion gap: 9 (ref 5–15)
BUN: 42 mg/dL — ABNORMAL HIGH (ref 6–20)
CALCIUM: 9.2 mg/dL (ref 8.9–10.3)
CHLORIDE: 102 mmol/L (ref 101–111)
CO2: 27 mmol/L (ref 22–32)
CREATININE: 1.6 mg/dL — AB (ref 0.61–1.24)
GFR calc non Af Amer: 47 mL/min — ABNORMAL LOW (ref >60)
GFR, EST AFRICAN AMERICAN: 54 mL/min — AB (ref >60)
Glucose, Bld: 162 mg/dL — ABNORMAL HIGH (ref 65–99)
Potassium: 3.6 mmol/L (ref 3.5–5.1)
SODIUM: 138 mmol/L (ref 135–145)

## 2017-04-13 LAB — BRAIN NATRIURETIC PEPTIDE: B NATRIURETIC PEPTIDE 5: 36.1 pg/mL (ref 0.0–100.0)

## 2017-04-13 MED ORDER — HYDRALAZINE HCL 25 MG PO TABS: 37.5000 mg | ORAL_TABLET | Freq: Three times a day (TID) | ORAL | 3 refills | Status: DC

## 2017-04-13 NOTE — Patient Instructions (Addendum)
INCREASE Hydralazine to 37.5 mg (1.5 tabs) three times daily.  Take metolazone 2.5 mg tablet once tomorrow with 1 EXTRA torsemide 40 mg tablet once tomorrow.  Routine lab work today. Will notify you of abnormal results, otherwise no news is good news!  Follow up 2 months with echocardiogram and appointment with Dr. Gala Romney. Take all medication as prescribed the day of your appointment. Bring all medications with you to your appointment.  Do the following things EVERYDAY: 1) Weigh yourself in the morning before breakfast. Write it down and keep it in a log. 2) Take your medicines as prescribed 3) Eat low salt foods-Limit salt (sodium) to 2000 mg per day.  4) Stay as active as you can everyday 5) Limit all fluids for the day to less than 2 liters

## 2017-04-13 NOTE — Addendum Note (Signed)
Encounter addended by: Chyrl Civatte, RN on: 04/13/2017 10:56 AM<BR>    Actions taken: Order list changed, Diagnosis association updated

## 2017-04-13 NOTE — Progress Notes (Signed)
Patient ID: Gary Hutchinson, male   DOB: 12-24-1960, 56 y.o.   MRN: [id number]    Advanced Heart Failure Clinic Note   PCP: Creola Corn EP: Hillis Range  HPI: [name] is a 56 year old male with a history of chronic systolic heart failure, ICM s/p ICD, HTN, DM II, OSA on CPAP, history of ventricular tachycardia status post AICD placement in 2009, atrial fibrillation and morbid obesity.    Admitted 6/1-03/13/14 for A/C HF and cardiogenic shock (co-ox 42%). Diuresed with IV lasix and milrinone which were weaned off.  He went into Afib with RVR and chemically cardioverted back to NSR with IV amiodarone. Placed on Xarelto. BB restarted 1/2 dose and held ACE-I. His blood sugars were elevated along with Hgb A1C and started on insulin. Discharge weight 344 lbs.   RHC (05/2014): Left sided filling pressures well compensated, mild pulm HTN with elevated right-sided pressures and mod/severely depressed CO. Discussion about trial of milrinone but patient wanted to hold off.  Admitted January 2017 with volume overload and cellulitis. Diuresed with IV lasix and required short term milrinone. Loaded on amio and had successful DC-CV on  10/17/2015.  Discharge weight was 307 pounds.   Admitted 2/18 with Influenza A/B. 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 Seen by EP and managed conservatively. Received IVF in hospital for sepsis but not diuresed back down afterward. Hydralazine and Imdur also stopped.   He presents today for follow up. Last seen in March by Pharm-D. Weight up 6 lbs since last visit. Has been eating poorly with celebrations over the summer including 7/4. Went down to [place] and has been eating a lot. Visiting family at the [place] and in [place]. Denies SOB. No orthopnea. Denies lightheadedness or dizziness. Has been drinking > 2 L. Wearing CPAP every night. Drys his mouth out and wakes up feeling very thirsty. Denies chest pain. Takes metolazone once every couple of weeks.    Optivol - Fluid index trending up. Thoracic impedence depressed. Pt activity > 2 hrs daily.    05/03/12: EF 25-30%.  Grade 1 diastolic dysfunction.  Mild MR.  Mod dilated LA.   07/27/13: EF 40%, diff HK, MR mild, LA mild/mod dilated  03/2014: EF30-35%, grade II DD, mild MR, RV systolic fx mildly decreased 10/2014: EF 40%.  10/2015: EF ~40%. RV mildly dilated.  8/17 EF 25-35%   Labs  03/08/13 K 3.8 Creatinine 1.02  07/27/13 K 4.7 Creatinine 1.61 Dig level 0.9 ---> dig cut back to 0.125 mg every other day 10/10/13 K 3.7 Creatinine 1.4  03/17/14: K 3.8, Creatinine 1.8, BUN 29 (PCP office) 05/10/14: K 4.0 Cr 2.4 11/17/15: K 3.2, Creatinine 2.04 05/18/14: K 4.1, Cr 1.8, BUN 34, dig level 0.7,  06/06/14: K 4.2 Creatinine 1.34  08/21/2014: K 4.1 Creatinine 1.46 Dig level 0.7 10/19/2015: K 3.6 Creatinine 2.15  11/21/15: K 3.9, Creatinine 1.38  SH: Lives with son in [place]. Disabled. No ETOH or tobacco abuse  FH; Mother living: HT        Father deceased: was not part of his life so not sure health issues  Review of systems complete and found to be negative unless listed in HPI.    Past Medical History:  Diagnosis Date  . AICD (automatic cardioverter/defibrillator) present   . Alcohol abuse    now quit  . Anemia    iron defi  . Arthritis    "fingers, knees, some days all my joints ache" (11/16/2015)  .  Asthma   . Cholecystitis   . Chronic systolic congestive heart failure (HCC)    a. RHC (02/2011) RA 31/27, RV 71/27, PA67/45, PCWP 46, PA 49%, Fick CO: 3.4 b. ECHO (03/2014) EF 30-35%, grade II DD, mild MR, RV poorly visualized appears mildly decreased c. RHC (05/2014) RA 13, RV 54/4/11, PA 60/22 (37), PCWP 17, Fick CO/CI: 4.4 / 1.9, Thermo CO/CI: 3.8 /1.6, PVR 5.2 WU, PA 57% and 59%  . Diverticulosis of colon   . Gout   . Hemorrhoids, internal   . Hyperlipidemia   . Hypertension   . Morbid obesity (HCC)   . Nonischemic cardiomyopathy (HCC)    a. LHC (02/2011) normal coronaries  . OSA on CPAP    . Paroxysmal atrial fibrillation (HCC)   . Pneumonia 2000s X 1  . Pulmonary hypertension (HCC)   . Renal insufficiency   . Type II diabetes mellitus (HCC)   . Ventricular tachycardia [facility])    s/p MDT ICD implant    Current Outpatient Prescriptions  Medication Sig Dispense Refill  . acetaminophen (TYLENOL) 325 MG tablet Take 325-650 mg by mouth every 6 (six) hours as needed (for pain). Reported on 10/11/2015    . albuterol (PROAIR HFA) 108 (90 BASE) MCG/ACT inhaler Inhale 2 puffs into the lungs every 6 (six) hours as needed for wheezing or shortness of breath. Reported on 02/06/2016    . allopurinol (ZYLOPRIM) 300 MG tablet Take 300 mg by mouth daily.    Marland Kitchen atorvastatin (LIPITOR) 40 MG tablet Take 1 tablet (40 mg total) by mouth daily. 90 tablet 6  . bisoprolol (ZEBETA) 5 MG tablet Take 1 tablet (5 mg total) by mouth daily. 90 tablet 3  . colchicine 0.6 MG tablet Take 0.6 mg by mouth daily as needed (for gout flares). Reported on 02/06/2016    . digoxin (LANOXIN) 0.125 MG tablet Take 0.5 tablets (0.0625 mg total) by mouth every other day. 45 tablet 3  . Fluticasone-Salmeterol (ADVAIR DISKUS) 250-50 MCG/DOSE AEPB Inhale 1 puff into the lungs every 12 (twelve) hours as needed (shortness of breath). Reported on 02/06/2016    . insulin detemir (LEVEMIR) 100 UNIT/ML injection Inject 36 Units into the skin at bedtime.     . isosorbide mononitrate (IMDUR) 30 MG 24 hr tablet TAKE 1 TABLET (30 MG TOTAL) BY MOUTH 2 TIMES DAILY. 180 tablet 3  . linagliptin (TRADJENTA) 5 MG TABS tablet Take 1 tablet (5 mg total) by mouth daily. 30 tablet 3  . methimazole (TAPAZOLE) 10 MG tablet Take 5 mg by mouth daily.     . metolazone (ZAROXOLYN) 2.5 MG tablet Take 2.5 mg by mouth daily as needed (edema). Reported on 02/06/2016    . montelukast (SINGULAIR) 10 MG tablet Take 10 mg by mouth daily as needed (for shortness of breath or wheezing).     . rivaroxaban (XARELTO) 20 MG TABS tablet Take 1 tablet (20 mg total) by mouth  daily with supper. 90 tablet 3  . sacubitril-valsartan (ENTRESTO) 49-51 MG Take 1 tablet by mouth 2 (two) times daily. 180 tablet 3  . spironolactone (ALDACTONE) 25 MG tablet Take 12.5 mg by mouth daily.     Marland Kitchen torsemide (DEMADEX) 20 MG tablet Take 40 mg (2 tabs) by mouth in the am and 20 mg (1 tab) in the pm    . hydrALAZINE (APRESOLINE) 25 MG tablet Take 1 tablet (25 mg total) by mouth 3 (three) times daily. 270 tablet 3   No current facility-administered medications for this  encounter.    Vitals:   04/13/17 1014  BP: 140/80  Pulse: 82  SpO2: 95%  Weight: 291 lb 3.2 oz (132.1 kg)     Wt Readings from Last 3 Encounters:  04/13/17 291 lb 3.2 oz (132.1 kg)  12/29/16 290 lb 9.6 oz (131.8 kg)  12/26/16 285 lb (129.3 kg)    PHYSICAL EXAM: General: Well appearing. No resp difficulty. HEENT: Normal Neck: Supple. JVP difficult to assess due to body habitus but appears elevated. Carotids 2+ bilat; no bruits. No thyromegaly or nodule noted. Cor: PMI nondisplaced. RRR, 2/6 TR murmur.  Lungs: CTAB, normal effort. Abdomen: Soft, non-tender, non-distended, no HSM. No bruits or masses. +BS  Extremities: No cyanosis, clubbing, or rash. Trace ankle edema.  Neuro: Alert & orientedx3, cranial nerves grossly intact. moves all 4 extremities w/o difficulty. Affect pleasant   ASSESSMENT & PLAN:   1) Chronic systolic HF: NICM s/p BiV Medtronic, EF 25% 06/03/2016.   - NYHA II symptoms. Volume status mildly elevated by exam and Optivol  - Take torsemide 40 mg BID tomorrow + a metolazone. Continue torsemide 40 mg q am and 20 mg q pm chronically.  - Continue entresto 49/51 BID.Marland Kitchen - Continue bisoprolol 5 mg daily.  - Continue digoxin 0.0625 mg every other day. - Increase hydralazine 37.5 mg TID - Continue imdur 30 mg BID.  - Reinforced fluid restriction to < 2 L daily, sodium restriction to less than 2000 mg daily, and the importance of daily weights.   2) CKD stage III - Followed by nephrology.  -  BMET today.   3) HTN  - Meds as above. Current regimen.   4) OSA  - Continue nightly CPAP.   5) PAF/AFL-  S/P Succesful DC-CV 10/17/2015  (Initial episode 03/2014) - Recurrent A fib 10/2015 with successful DC/CV on 10/17/15.  - Had episode of AFL with ICD shock in 2/18 at time of influenza - Now off amiodarone with thyrotoxicity. Evaluated by EP and for now will follow. If he has further AF will need to consider tikosyn or ablation. - ICD interrogation today with no recurrent AF.  - Continue xarelto 20 mg daily. Denies any bleeding.  - Labs today.  - Needs yearly eye exam and he is aware.  6) Obesity:   - Encouraged to try and lose weight and increase activity.   7) Amiodarone thyroxicity  - Off Amiodarone. Continue Methimazole 10 mg BID for now.  (recently decreased by PCP) - PCP following thyroid.  - No further Afib.  - No change.   Meds and labs as above.  RTC 2 months with Echo.   Graciella Freer, [name]  04/13/2017 10:33 AM   Greater than 50% of the 25 minute visit was spent in counseling/coordination of care regarding disease state education, sliding scale diuretics, salt/fluid restriction, and medication compliance.

## 2017-04-14 ENCOUNTER — Telehealth (HOSPITAL_COMMUNITY): Payer: Self-pay

## 2017-04-14 DIAGNOSIS — I5022 Chronic systolic (congestive) heart failure: Secondary | ICD-10-CM

## 2017-04-14 NOTE — Telephone Encounter (Signed)
Basic metabolic panel  Order: [id number]  Status:  Final result Visible to patient:  No (Not Released) Dx:  Chronic systolic congestive heart fai...  Notes recorded by Chyrl Civatte, RN on 04/14/2017 at 11:51 AM EDT Patient aware to take 40 meq potassium once today only with his extra diuretic protocol for today. Scheduled for two weeks to recheck BMET. ------  Notes recorded by Graciella Freer, [name] on 04/13/2017 at 11:56 AM EDT BNP OK.     ------  Notes recorded by Graciella Freer, [name] on 04/13/2017 at 11:46 AM EDT Please make sure he takes extra potassium with metolazone tomorrow ( 40 meq).    Please repeat BMET 2 weeks.    [name] [address] [place], [name] 04/13/2017 11:46 AM

## 2017-04-27 ENCOUNTER — Ambulatory Visit (HOSPITAL_COMMUNITY)
Admission: RE | Admit: 2017-04-27 | Discharge: 2017-04-27 | Disposition: A | Discharge status: Home / Self Care | Payer: Medicare Other | Source: Ambulatory Visit | Attending: Cardiology | Admitting: Cardiology

## 2017-04-27 DIAGNOSIS — I5022 Chronic systolic (congestive) heart failure: Secondary | ICD-10-CM

## 2017-04-27 LAB — BASIC METABOLIC PANEL
Anion gap: 10 (ref 5–15)
BUN: 47 mg/dL — ABNORMAL HIGH (ref 6–20)
CALCIUM: 9.6 mg/dL (ref 8.9–10.3)
CO2: 27 mmol/L (ref 22–32)
CREATININE: 1.82 mg/dL — AB (ref 0.61–1.24)
Chloride: 100 mmol/L — ABNORMAL LOW (ref 101–111)
GFR calc non Af Amer: 40 mL/min — ABNORMAL LOW (ref >60)
GFR, EST AFRICAN AMERICAN: 46 mL/min — AB (ref >60)
GLUCOSE: 187 mg/dL — AB (ref 65–99)
Potassium: 4.2 mmol/L (ref 3.5–5.1)
Sodium: 137 mmol/L (ref 135–145)

## 2017-04-30 ENCOUNTER — Ambulatory Visit (INDEPENDENT_AMBULATORY_CARE_PROVIDER_SITE_OTHER): Payer: Medicare Other

## 2017-04-30 DIAGNOSIS — Z95810 Presence of automatic (implantable) cardiac defibrillator: Secondary | ICD-10-CM

## 2017-04-30 DIAGNOSIS — I5022 Chronic systolic (congestive) heart failure: Secondary | ICD-10-CM

## 2017-04-30 NOTE — Progress Notes (Signed)
EPIC Encounter for ICM Monitoring  Patient Name: Gary Hutchinson is a 56 y.o. male Date: 04/30/2017 Primary Care Physican: Shon Baton, MD Primary Cardiologist:[name] Electrophysiologist: Allred Dry Weight: 288lbs BiV Pacing: 98.3%     Heart Failure questions reviewed, pt asymptomatic.   Thoracic impedance normal but does have pattern suggesting he has a fluid accumulation for a few days and then returns to baseline.  Prescribed dosage: Torsemide 20 mg 2 tablets (40 mg total) in AMand 1 tablet (20 mg total) every PM. Metolazone 2.5 mg 1 tablet as needed.  Labs: 04/27/2017 Creatinine 1.82, BUN 47, Potassium 4.2, Sodium 137, EGFR 40-46 04/13/2017 Creatinine 1.60, BUN 42, Potassium 3.6, Sodium 138, EGFR 47-54 12/29/2016 Creatinine 1.16, BUN 21, Potassium 4.1, Sodium 139, EGFR >60 12/03/2016 Creatinine 1.54, BUN 34, Potassium 4.0, Sodium 136, EGFR 49-57  11/27/2016 Creatinine 1.38, BUN 26, Potassium 3.8, Sodium 138, EGFR 56->60  11/26/2016 Creatinine 1.97, BUN 44, Potassium 3.5, Sodium 138, EGFR 36-42  02/20/2018Creatinine 2.32, BUN 48, Potassium 3.5, Sodium 135, EGFR 30-35  09/10/2016 Creatinine 1.36, BUN 20, Potassium 4.5, Sodium 139, EGFR 57->60 09/03/2016 Creatinine 1.39, BUN 34, Potassium 4.8, Sodium 136, EGFR 56->60  08/06/2016 Creatinine 1.41, BUN 21, Potassium 4.7, Sodium 131, EGFR 55->60  06/24/2016 Creatinine 1.16, BUN 27, Potassium 3.7, Sodium 137, EGFR >60   Recommendations: No changes.  He reported he drinking a lot of fluid and advised to limit to 64 oz daily. Encouraged to call for fluid symptoms.  Follow-up plan: ICM clinic phone appointment on 06/01/2017.  Office appointment scheduled 06/05/2017 with Chanetta Marshall, NP and Dr Haroldine Laws 06/18/2017.  Copy of ICM check sent to device physician.   3 month ICM trend: 04/30/2017   1 Year ICM trend:      [name], RN 04/30/2017 11:44 AM

## 2017-05-04 ENCOUNTER — Other Ambulatory Visit (HOSPITAL_COMMUNITY): Payer: Self-pay | Admitting: Internal Medicine

## 2017-05-05 ENCOUNTER — Other Ambulatory Visit: Payer: Self-pay | Admitting: Internal Medicine

## 2017-06-01 ENCOUNTER — Ambulatory Visit (INDEPENDENT_AMBULATORY_CARE_PROVIDER_SITE_OTHER): Payer: Medicare Other

## 2017-06-01 DIAGNOSIS — I5022 Chronic systolic (congestive) heart failure: Secondary | ICD-10-CM | POA: Diagnosis not present

## 2017-06-01 DIAGNOSIS — Z95810 Presence of automatic (implantable) cardiac defibrillator: Secondary | ICD-10-CM

## 2017-06-01 NOTE — Progress Notes (Signed)
EPIC Encounter for ICM Monitoring  Patient Name: Gary Hutchinson is a 56 y.o. male Date: 06/01/2017 Primary Care Physican: Shon Baton, MD Primary Cardiologist:[name] Electrophysiologist: Allred Dry Weight: 291lbs BiV Pacing: 97.6%         Heart Failure questions reviewed, pt symptomatic with about 3 pounds of weight gain in the last week.  He took 1/2 tablet of Metolazone with some relief and decrease of weight but not at baseline.   Thoracic impedance abnormal suggesting fluid accumulation until today, 06/01/2017.  Prescribed dosage: Torsemide 20 mg 2 tablets (40 mg total) in AMand 1 tablet (20 mg total) every PM. Metolazone 2.5 mg 1 tablet as needed.  Labs: 04/27/2017 Creatinine 1.82, BUN 47, Potassium 4.2, Sodium 137, EGFR 40-46 04/13/2017 Creatinine 1.60, BUN 42, Potassium 3.6, Sodium 138, EGFR 47-54 12/29/2016 Creatinine 1.16, BUN 21, Potassium 4.1, Sodium 139, EGFR >60 12/03/2016 Creatinine 1.54, BUN 34, Potassium 4.0, Sodium 136, EGFR 49-57  11/27/2016 Creatinine 1.38, BUN 26, Potassium 3.8, Sodium 138, EGFR 56->60  11/26/2016 Creatinine 1.97, BUN 44, Potassium 3.5, Sodium 138, EGFR 36-42  02/20/2018Creatinine 2.32, BUN 48, Potassium 3.5, Sodium 135, EGFR 30-35  09/10/2016 Creatinine 1.36, BUN 20, Potassium 4.5, Sodium 139, EGFR 57->60 09/03/2016 Creatinine 1.39, BUN 34, Potassium 4.8, Sodium 136, EGFR 56->60  08/06/2016 Creatinine 1.41, BUN 21, Potassium 4.7, Sodium 131, EGFR 55->60  06/24/2016 Creatinine 1.16, BUN 27, Potassium 3.7, Sodium 137, EGFR >60   Recommendations:  No changes today.  Advised to limit salt intake and he said he is doing the best he can.  He was sleeping prior to call and said he is not very active.   Follow-up plan: ICM clinic phone appointment on 07/09/2017. Office appointment scheduled 06/05/2017 with Chanetta Marshall, NP and Dr Haroldine Laws 06/18/2017.  Copy of ICM check sent to Dr. Rayann Heman.   3 month ICM trend: 06/01/2017   1 Year ICM trend:        [name], RN 06/01/2017 9:28 AM

## 2017-06-02 ENCOUNTER — Other Ambulatory Visit (HOSPITAL_COMMUNITY): Payer: Self-pay | Admitting: Internal Medicine

## 2017-06-05 ENCOUNTER — Encounter: Payer: Medicare Other | Admitting: Nurse Practitioner

## 2017-06-18 ENCOUNTER — Ambulatory Visit (HOSPITAL_COMMUNITY): Payer: Medicare Other

## 2017-06-18 ENCOUNTER — Encounter (HOSPITAL_COMMUNITY): Payer: Medicare Other | Admitting: Internal Medicine

## 2017-06-20 NOTE — Progress Notes (Signed)
Cardiology Office Note Date:  06/23/2017  Patient ID:  Gary Hutchinson, DOB 12/25/1960, MRN [id number] PCP:  Creola Corn, MD  Cardiologist:  Dr. Gala Romney Electrophysiologist: Dr. [name]    Chief Complaint: 6 mo EP/device visit  History of Present Illness: Gary Hutchinson is a 56 y.o. male with history of NICM, chronic CHF (systolic), ICD, DM, HTN, OSA w/CPAP, VT, AFib, hx of amio thyrotoxicity managed with his PMD on methimazole, morbid obesity.  He comes today to be seen for Dr. [name], last seen by him in March, at that time doing well, mentioning given his severe LA enlargement, not enthusiastic about an ablation for his AFib, planned for 6 month visit.  He follows closely with CHF team and [employer] fund[employer].  He is feeling well, has been doing some traveling, [place] and [place] eating unusually, and late, has put on some weight, though does not feel bloated or like he is retaining water.  He denies any kind of CP, palpitations, or SOB, no rest SOB or exertional intolerances, he walks (when not on vacation) every day for exercise without chances to his ability to do so, no symptoms of PND or orthopnea are reported.  He has not felt like he has had any AF.  No dizziness, near syncope or syncope.  He reports his thyroid had leveled off and was taken off the methimazole.   Device information MDT ICD implanted 2009, upgrade to CRT-D 11/16/15, Dr. [name]  + inappropriate shock for rapid Aflutter (atypical) in the setting of illness w/flu AAD: amiodarone developed thyrotoxicity  Past Medical History:  Diagnosis Date  . AICD (automatic cardioverter/defibrillator) present   . Alcohol abuse    now quit  . Anemia    iron defi  . Arthritis    "fingers, knees, some days all my joints ache" (11/16/2015)  . Asthma   . Cholecystitis   . Chronic systolic congestive heart failure (HCC)    a. RHC (02/2011) RA 31/27, RV 71/27, PA67/45, PCWP 46, PA 49%, Fick CO: 3.4 b. ECHO (03/2014) EF 30-35%, grade  II DD, mild MR, RV poorly visualized appears mildly decreased c. RHC (05/2014) RA 13, RV 54/4/11, PA 60/22 (37), PCWP 17, Fick CO/CI: 4.4 / 1.9, Thermo CO/CI: 3.8 /1.6, PVR 5.2 WU, PA 57% and 59%  . Diverticulosis of colon   . Gout   . Hemorrhoids, internal   . Hyperlipidemia   . Hypertension   . Morbid obesity (HCC)   . Nonischemic cardiomyopathy (HCC)    a. LHC (02/2011) normal coronaries  . OSA on CPAP   . Paroxysmal atrial fibrillation (HCC)   . Pneumonia 2000s X 1  . Pulmonary hypertension (HCC)   . Renal insufficiency   . Type II diabetes mellitus (HCC)   . Ventricular tachycardia [facility])    s/p MDT ICD implant    Past Surgical History:  Procedure Laterality Date  . CARDIAC CATHETERIZATION  03/08/2004   EF 25-30%  . CARDIOVERSION N/A 10/17/2015   Procedure: CARDIOVERSION;  Surgeon: [name], MD;  Location: [facility] ENDOSCOPY;  Service: Cardiovascular;  Laterality: N/A;  . EP IMPLANTABLE DEVICE N/A 11/16/2015   Procedure: BiVI Upgrade;  Surgeon: Hillis Range, MD;  Medtronic [address] [place]  . INSERTION OF ICD  06/2008   ICD- Medtronic   . RIGHT HEART CATHETERIZATION N/A 05/23/2014   Procedure: RIGHT HEART CATH;  Surgeon: [name], MD;  Location: [facility] CATH LAB;  Service: Cardiovascular;  Laterality: N/A;  . TRANSTHORACIC ECHOCARDIOGRAM  05/27/2008   EF 30-35%  . US ECHOCARDIOGRAPHY  08/22/2008   EF 30-35%    Current Outpatient Prescriptions  Medication Sig Dispense Refill  . acetaminophen (TYLENOL) 325 MG tablet Take 325-650 mg by mouth every 6 (six) hours as needed (for pain). Reported on 10/11/2015    . albuterol (PROAIR HFA) 108 (90 BASE) MCG/ACT inhaler Inhale 2 puffs into the lungs every 6 (six) hours as needed for wheezing or shortness of breath. Reported on 02/06/2016    . allopurinol (ZYLOPRIM) 300 MG tablet Take 300 mg by mouth daily.    Marland Kitchen atorvastatin (LIPITOR) 40 MG tablet TAKE 1 TABLET (40 MG TOTAL) BY MOUTH DAILY. 90 tablet 2  . bisoprolol (ZEBETA) 5 MG tablet Take  1 tablet (5 mg total) by mouth daily. 90 tablet 3  . colchicine 0.6 MG tablet Take 0.6 mg by mouth daily as needed (for gout flares). Reported on 02/06/2016    . digoxin (LANOXIN) 0.125 MG tablet Take 0.5 tablets (0.0625 mg total) by mouth every other day. 45 tablet 3  . Fluticasone-Salmeterol (ADVAIR DISKUS) 250-50 MCG/DOSE AEPB Inhale 1 puff into the lungs every 12 (twelve) hours as needed (shortness of breath). Reported on 02/06/2016    . hydrALAZINE (APRESOLINE) 25 MG tablet Take 1.5 tablets (37.5 mg total) by mouth 3 (three) times daily. 405 tablet 3  . insulin detemir (LEVEMIR) 100 UNIT/ML injection Inject 36 Units into the skin at bedtime.     . isosorbide mononitrate (IMDUR) 30 MG 24 hr tablet TAKE 1 TABLET (30 MG TOTAL) BY MOUTH 2 TIMES DAILY. 180 tablet 3  . linagliptin (TRADJENTA) 5 MG TABS tablet Take 1 tablet (5 mg total) by mouth daily. 30 tablet 3  . metolazone (ZAROXOLYN) 2.5 MG tablet Take 2.5 mg by mouth daily as needed (edema). Reported on 02/06/2016    . montelukast (SINGULAIR) 10 MG tablet Take 10 mg by mouth daily as needed (for shortness of breath or wheezing).     . rivaroxaban (XARELTO) 20 MG TABS tablet Take 1 tablet (20 mg total) by mouth daily with supper. 90 tablet 3  . sacubitril-valsartan (ENTRESTO) 49-51 MG Take 1 tablet by mouth 2 (two) times daily. 180 tablet 3  . spironolactone (ALDACTONE) 25 MG tablet Take 12.5 mg by mouth daily.     Marland Kitchen torsemide (DEMADEX) 20 MG tablet Take 40 mg (2 tabs) by mouth in the am and 20 mg (1 tab) in the pm     No current facility-administered medications for this visit.     Allergies:   Patient has no known allergies.   Social History:  The patient  reports that he has never smoked. He has never used smokeless tobacco. He reports that he drinks alcohol. He reports that he does not use drugs.   Family History:  The patient's family history includes CAD in his unknown relative; Cancer in his father; Diabetes in his unknown relative;  Hypertension in his mother.  ROS:  Please see the history of present illness. All other systems are reviewed and otherwise negative.   PHYSICAL EXAM:  VS:  BP 118/66   Pulse 84   Ht 5\' 5"  (1.651 m)   Wt 296 lb (134.3 kg)   BMI 49.26 kg/m  BMI: Body mass index is 49.26 kg/m. Well nourished, well developed, in no acute distress  HEENT: normocephalic, atraumatic  Neck: no JVD, carotid bruits or masses Cardiac:  RRR; no significant murmurs, no rubs, or gallops Lungs:  CTA b/l, no wheezing,  rhonchi or rales  Abd: soft, nontender, obese MS: no deformity or atrophy Ext: no edema  Skin: warm and dry, no rash Neuro:  No gross deficits appreciated Psych: euthymic mood, full affect  ICD site is stable, no tethering or discomfort   EKG: not done today ICD interrogation done today and reviewed by myself: battery and lead measurements are good, no episodes/observations, OptiVol is below threshold, 98% V pacing  05/03/12: EF 25-30%.  Grade 1 diastolic dysfunction.  Mild MR.  Mod dilated LA.   07/27/13: EF 40%, diff HK, MR mild, LA mild/mod dilated  03/2014: EF30-35%, grade II DD, mild MR, RV systolic fx mildly decreased 10/2014: EF 40%.  10/2015: EF ~40%. RV mildly dilated.  8/17 EF 25-35%  Recent Labs: 11/26/2016: Magnesium 2.1 11/27/2016: Hemoglobin 10.3; Platelets 275 12/03/2016: ALT 22; TSH 2.187 04/13/2017: B Natriuretic Peptide 36.1 04/27/2017: BUN 47; Creatinine, Ser 1.82; Potassium 4.2; Sodium 137  No results found for requested labs within last 8760 hours.   CrCl cannot be calculated (Patient's most recent lab result is older than the maximum 21 days allowed.).   Wt Readings from Last 3 Encounters:  06/23/17 296 lb (134.3 kg)  04/13/17 291 lb 3.2 oz (132.1 kg)  12/29/16 290 lb 9.6 oz (131.8 kg)     Other studies reviewed: Additional studies/records reviewed today include: summarized above  ASSESSMENT AND PLAN:  1. CRT-D     Intact function, no changes made  2. Chronic  CHF (systolic), NICM     Compensated by exam and OptiVol     Follows with CHF team  Will get a dig level today  3. Paroxysmal AFib     CHA2DS2Vasc is at least 3, on xarelto, appropriately dosed (last Cr. Cl = 83)  4. HTN     Looks good, no changes  5. Obesity     Discussed importance of weight loss, he is motivated, back at home and on his usual regime, walking regularly   Disposition: F/u with Q 3 month remotes, EP visit in 6 months, sooner if needed.  heis followed routinely with CHF team to continue as directed by them, alos followed with ICM clinic RN.  Current medicines are reviewed at length with the patient today.  The patient did not have any concerns regarding medicines.  Norma Fredrickson, [name] 06/23/2017 9:57 AM     [facility] [address] [place] [address] [phone] (office)  [phone] (fax)

## 2017-06-23 ENCOUNTER — Ambulatory Visit (INDEPENDENT_AMBULATORY_CARE_PROVIDER_SITE_OTHER): Payer: Medicare Other | Admitting: Physician Assistant

## 2017-06-23 VITALS — BP 118/66 | HR 84 | Ht 65.0 in | Wt 296.0 lb

## 2017-06-23 DIAGNOSIS — Z95810 Presence of automatic (implantable) cardiac defibrillator: Secondary | ICD-10-CM | POA: Diagnosis not present

## 2017-06-23 DIAGNOSIS — I428 Other cardiomyopathies: Secondary | ICD-10-CM

## 2017-06-23 DIAGNOSIS — Z79899 Other long term (current) drug therapy: Secondary | ICD-10-CM

## 2017-06-23 DIAGNOSIS — I480 Paroxysmal atrial fibrillation: Secondary | ICD-10-CM

## 2017-06-23 DIAGNOSIS — I5022 Chronic systolic (congestive) heart failure: Secondary | ICD-10-CM

## 2017-06-23 DIAGNOSIS — I10 Essential (primary) hypertension: Secondary | ICD-10-CM

## 2017-06-23 NOTE — Patient Instructions (Signed)
Medication Instructions:    Your physician recommends that you continue on your current medications as directed. Please refer to the Current Medication list given to you today.   If you need a refill on your cardiac medications before your next appointment, please call your pharmacy.  Labwork: DIGOXIN LEVEL TODAY    Testing/Procedures: NONE ORDERED  TODAY    Follow-Up: Your physician wants you to follow-up in:  IN  6  MONTHS WITH URSUY   You will receive a reminder letter in the mail two months in advance. If you don't receive a letter, please call our office to schedule the follow-up appointment.       Any Other Special Instructions Will Be Listed Below (If Applicable).

## 2017-06-24 LAB — DIGOXIN LEVEL: Digoxin, Serum: 0.6 ng/mL (ref 0.5–0.9)

## 2017-07-09 ENCOUNTER — Ambulatory Visit (INDEPENDENT_AMBULATORY_CARE_PROVIDER_SITE_OTHER): Payer: Medicare Other | Admitting: *Deleted

## 2017-07-09 ENCOUNTER — Ambulatory Visit (INDEPENDENT_AMBULATORY_CARE_PROVIDER_SITE_OTHER): Payer: Medicare Other

## 2017-07-09 DIAGNOSIS — I5022 Chronic systolic (congestive) heart failure: Secondary | ICD-10-CM

## 2017-07-09 DIAGNOSIS — I428 Other cardiomyopathies: Secondary | ICD-10-CM | POA: Diagnosis not present

## 2017-07-09 DIAGNOSIS — Z95810 Presence of automatic (implantable) cardiac defibrillator: Secondary | ICD-10-CM | POA: Diagnosis not present

## 2017-07-09 NOTE — Progress Notes (Signed)
EPIC Encounter for ICM Monitoring  Patient Name: Gary Hutchinson is a 56 y.o. male Date: 07/09/2017 Primary Care Physican: Shon Baton, MD Primary Cardiologist:[name] Electrophysiologist: Allred Dry Weight: 292lbs Bi-V Pacing:   97.4%       Heart Failure questions reviewed, pt reported breathing is fine but has gained a couple of pounds.  He reported he has been traveling and eating restaurant foods   Thoracic impedance abnormal suggesting fluid accumulation since 06/20/2017.  Prescribed dosage: Torsemide 20 mg 2 tablets (40 mg total) in AMand 1 tablet (20 mg total) every PM. Metolazone 2.5 mg 1 tablet as needed.  Labs: 04/27/2017 Creatinine 1.82, BUN 47, Potassium 4.2, Sodium 137, EGFR 40-46 04/13/2017 Creatinine 1.60, BUN 42, Potassium 3.6, Sodium 138, EGFR 47-54 12/29/2016 Creatinine 1.16, BUN 21, Potassium 4.1, Sodium 139, EGFR >60 12/03/2016 Creatinine 1.54, BUN 34, Potassium 4.0, Sodium 136, EGFR 49-57  11/27/2016 Creatinine 1.38, BUN 26, Potassium 3.8, Sodium 138, EGFR 56->60  11/26/2016 Creatinine 1.97, BUN 44, Potassium 3.5, Sodium 138, EGFR 36-42  02/20/2018Creatinine 2.32, BUN 48, Potassium 3.5, Sodium 135, EGFR 30-35  09/10/2016 Creatinine 1.36, BUN 20, Potassium 4.5, Sodium 139, EGFR 57->60 09/03/2016 Creatinine 1.39, BUN 34, Potassium 4.8, Sodium 136, EGFR 56->60  08/06/2016 Creatinine 1.41, BUN 21, Potassium 4.7, Sodium 131, EGFR 55->60  06/24/2016 Creatinine 1.16, BUN 27, Potassium 3.7, Sodium 137, EGFR >60   Recommendations: He stated he would take a Metolazone in the next couple of days.    Follow-up plan: ICM clinic phone appointment on 07/20/2017.  Office appointment scheduled 07/27/2017 with Dr. Haroldine Laws.  Copy of ICM check sent to Dr. Rayann Heman and Dr. Haroldine Laws for review and recommendations if needed.   3 month ICM trend: 07/09/2017   1 Year ICM trend:      [name], RN 07/09/2017 12:09 PM

## 2017-07-09 NOTE — Progress Notes (Signed)
Remote ICD transmission.   

## 2017-07-10 LAB — CUP PACEART REMOTE DEVICE CHECK
Battery Remaining Longevity: 92 mo
Battery Voltage: 2.99 V
Brady Statistic AP VS Percent: 0.05 %
Brady Statistic AS VS Percent: 1.48 %
Brady Statistic RV Percent Paced: 6.99 %
Date Time Interrogation Session: 20181004041703
HighPow Impedance: 89 Ohm
Implantable Lead Implant Date: 20170210
Implantable Lead Implant Date: 20170210
Implantable Lead Location: 753859
Implantable Lead Model: 4598
Implantable Lead Model: 6935
Implantable Pulse Generator Implant Date: 20170210
Lead Channel Impedance Value: 342 Ohm
Lead Channel Impedance Value: 399 Ohm
Lead Channel Impedance Value: 399 Ohm
Lead Channel Impedance Value: 418 Ohm
Lead Channel Impedance Value: 513 Ohm
Lead Channel Impedance Value: 551 Ohm
Lead Channel Impedance Value: 722 Ohm
Lead Channel Impedance Value: 836 Ohm
Lead Channel Impedance Value: 874 Ohm
Lead Channel Pacing Threshold Amplitude: 1 V
Lead Channel Pacing Threshold Pulse Width: 0.4 ms
Lead Channel Pacing Threshold Pulse Width: 0.4 ms
Lead Channel Sensing Intrinsic Amplitude: 11.75 mV
Lead Channel Sensing Intrinsic Amplitude: 11.75 mV
Lead Channel Sensing Intrinsic Amplitude: 4.125 mV
Lead Channel Setting Pacing Amplitude: 1.5 V
Lead Channel Setting Pacing Amplitude: 2 V
Lead Channel Setting Pacing Amplitude: 2 V
Lead Channel Setting Pacing Pulse Width: 0.4 ms
Lead Channel Setting Pacing Pulse Width: 0.4 ms
MDC IDC LEAD IMPLANT DT: 20090901
MDC IDC LEAD LOCATION: 753858
MDC IDC LEAD LOCATION: 753860
MDC IDC MSMT LEADCHNL LV IMPEDANCE VALUE: 456 Ohm
MDC IDC MSMT LEADCHNL LV IMPEDANCE VALUE: 646 Ohm
MDC IDC MSMT LEADCHNL LV IMPEDANCE VALUE: 836 Ohm
MDC IDC MSMT LEADCHNL RA IMPEDANCE VALUE: 399 Ohm
MDC IDC MSMT LEADCHNL RA PACING THRESHOLD AMPLITUDE: 0.625 V
MDC IDC MSMT LEADCHNL RA SENSING INTR AMPL: 4.125 mV
MDC IDC MSMT LEADCHNL RV PACING THRESHOLD AMPLITUDE: 0.75 V
MDC IDC MSMT LEADCHNL RV PACING THRESHOLD PULSEWIDTH: 0.4 ms
MDC IDC SET LEADCHNL RV SENSING SENSITIVITY: 0.3 mV
MDC IDC STAT BRADY AP VP PERCENT: 2.08 %
MDC IDC STAT BRADY AS VP PERCENT: 96.39 %
MDC IDC STAT BRADY RA PERCENT PACED: 2.13 %

## 2017-07-16 ENCOUNTER — Encounter: Payer: Self-pay | Admitting: Cardiology

## 2017-07-20 ENCOUNTER — Telehealth: Payer: Self-pay

## 2017-07-20 ENCOUNTER — Ambulatory Visit (INDEPENDENT_AMBULATORY_CARE_PROVIDER_SITE_OTHER): Payer: Self-pay

## 2017-07-20 DIAGNOSIS — Z95810 Presence of automatic (implantable) cardiac defibrillator: Secondary | ICD-10-CM

## 2017-07-20 DIAGNOSIS — I5022 Chronic systolic (congestive) heart failure: Secondary | ICD-10-CM

## 2017-07-20 NOTE — Progress Notes (Signed)
EPIC Encounter for ICM Monitoring  Patient Name: Gary Hutchinson is a 56 y.o. male Date: 07/20/2017 Primary Care Physican: Shon Baton, MD Primary Cardiologist:[name] Electrophysiologist: Allred Dry Weight: 292lbs Bi-V Pacing:   54.8 % (dropped from 97.4% on 07/09/2017)    Since 09-Jul-2017 AT/AF 1 episode Time in AT/AF 19.3 hr/day (80.6%)  Longest AT/AF 9 days      Heart Failure questions reviewed, pt asymptomatic.  He said he is feeling fine today and has an appointment on Thursday morning and is with Afib Clinic. Message was sent to device clinic for review regarding changes in BiV Pacing and rhythm.     Thoracic impedance improved since 10/4 transmission but is very close to baseline.    Prescribed dosage: Torsemide 20 mg 2 tablets (40 mg total) in AMand 1 tablet (20 mg total) every PM. Metolazone 2.5 mg 1 tablet as needed.  Labs: 04/27/2017 Creatinine 1.82, BUN 47, Potassium 4.2, Sodium 137, EGFR 40-46 04/13/2017 Creatinine 1.60, BUN 42, Potassium 3.6, Sodium 138, EGFR 47-54 12/29/2016 Creatinine 1.16, BUN 21, Potassium 4.1, Sodium 139, EGFR >60 12/03/2016 Creatinine 1.54, BUN 34, Potassium 4.0, Sodium 136, EGFR 49-57  11/27/2016 Creatinine 1.38, BUN 26, Potassium 3.8, Sodium 138, EGFR 56->60  11/26/2016 Creatinine 1.97, BUN 44, Potassium 3.5, Sodium 138, EGFR 36-42  02/20/2018Creatinine 2.32, BUN 48, Potassium 3.5, Sodium 135, EGFR 30-35  09/10/2016 Creatinine 1.36, BUN 20, Potassium 4.5, Sodium 139, EGFR 57->60 09/03/2016 Creatinine 1.39, BUN 34, Potassium 4.8, Sodium 136, EGFR 56->60  08/06/2016 Creatinine 1.41, BUN 21, Potassium 4.7, Sodium 131, EGFR 55->60  06/24/2016 Creatinine 1.16, BUN 27, Potassium 3.7, Sodium 137, EGFR >60   Recommendations: No changes.   Encouraged to call for fluid symptoms.  Follow-up plan: ICM clinic phone appointment on 08/03/2017 (1 week after office visit with Dr Haroldine Laws) to recheck fluid levels.  Office appointment scheduled  07/27/2017 with Dr. Haroldine Laws.  Copy of ICM check sent to Dr. Rayann Heman and Dr. Haroldine Laws.   3 month ICM trend: 07/20/2017   AT/AF    1 Year ICM trend:      [name], RN 07/20/2017 10:39 AM

## 2017-07-20 NOTE — Telephone Encounter (Signed)
Remote ICM transmission received.  Attempted call to patient and left message to return call. 

## 2017-07-23 ENCOUNTER — Ambulatory Visit (HOSPITAL_COMMUNITY)
Admission: RE | Admit: 2017-07-23 | Discharge: 2017-07-23 | Disposition: A | Discharge status: Home / Self Care | Payer: Medicare Other | Source: Ambulatory Visit | Attending: Nurse Practitioner | Admitting: Nurse Practitioner

## 2017-07-23 ENCOUNTER — Encounter (HOSPITAL_COMMUNITY): Payer: Self-pay | Admitting: Nurse Practitioner

## 2017-07-23 VITALS — BP 112/64 | HR 81 | Ht 65.0 in | Wt 302.8 lb

## 2017-07-23 DIAGNOSIS — I4892 Unspecified atrial flutter: Secondary | ICD-10-CM

## 2017-07-23 DIAGNOSIS — Z8249 Family history of ischemic heart disease and other diseases of the circulatory system: Secondary | ICD-10-CM | POA: Insufficient documentation

## 2017-07-23 DIAGNOSIS — Z7901 Long term (current) use of anticoagulants: Secondary | ICD-10-CM | POA: Insufficient documentation

## 2017-07-23 DIAGNOSIS — N183 Chronic kidney disease, stage 3 (moderate): Secondary | ICD-10-CM | POA: Insufficient documentation

## 2017-07-23 DIAGNOSIS — Z809 Family history of malignant neoplasm, unspecified: Secondary | ICD-10-CM | POA: Diagnosis not present

## 2017-07-23 DIAGNOSIS — Z833 Family history of diabetes mellitus: Secondary | ICD-10-CM | POA: Diagnosis not present

## 2017-07-23 DIAGNOSIS — Z9889 Other specified postprocedural states: Secondary | ICD-10-CM | POA: Diagnosis not present

## 2017-07-23 DIAGNOSIS — I130 Hypertensive heart and chronic kidney disease with heart failure and stage 1 through stage 4 chronic kidney disease, or unspecified chronic kidney disease: Secondary | ICD-10-CM | POA: Insufficient documentation

## 2017-07-23 DIAGNOSIS — E1122 Type 2 diabetes mellitus with diabetic chronic kidney disease: Secondary | ICD-10-CM | POA: Insufficient documentation

## 2017-07-23 DIAGNOSIS — G4730 Sleep apnea, unspecified: Secondary | ICD-10-CM | POA: Diagnosis not present

## 2017-07-23 DIAGNOSIS — Z794 Long term (current) use of insulin: Secondary | ICD-10-CM | POA: Diagnosis not present

## 2017-07-23 DIAGNOSIS — Z79899 Other long term (current) drug therapy: Secondary | ICD-10-CM | POA: Insufficient documentation

## 2017-07-23 DIAGNOSIS — E785 Hyperlipidemia, unspecified: Secondary | ICD-10-CM | POA: Diagnosis not present

## 2017-07-23 NOTE — Progress Notes (Signed)
Gary Hutchinson     Primary Care Physician: Creola Corn, MD Referring Physician: Otilio Saber, PA EP: Dr. [name] AHP: Dr. Luna Fuse Gary Hutchinson is a 56 y.o. 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 He is on xarelto for a CHA2DS2VASc score of at  least 3,states no missed doses.   He is beening seen in the afib clinic after device clinic at [facility] reported aflutter for a few weeks, this corresponds with a sharp incline in his optivol. He reports that he went to [place] the first of October x 2 1/2 weeks and just returned a few days ago. He admits that he did not eat well while there and has noted 7-8 lbs of extra weight. He states that he feels ok, no significant shortness of breath, PND. He has taken metolazone x1 since returning home. He is also reporting waking up with a dry mouth from CPAP and drinking water throughout the night.  Today, he denies symptoms of palpitations, chest pain, shortness of breath, orthopnea, PND, lower extremity edema, dizziness, presyncope, syncope, or neurologic sequela. The patient is tolerating medications without difficulties and is otherwise without complaint today.   Past Medical History:  Diagnosis Date  . AICD (automatic cardioverter/defibrillator) present   . Alcohol abuse    now quit  . Anemia    iron defi  . Arthritis    "fingers, knees, some days all my joints ache" (11/16/2015)  . Asthma   . Cholecystitis   . Chronic systolic congestive heart failure (HCC)    a. RHC (02/2011) RA 31/27, RV 71/27, PA67/45, PCWP 46, PA 49%, Fick CO: 3.4 b. ECHO (03/2014) EF 30-35%, grade II DD, mild MR, RV poorly visualized appears mildly decreased c. RHC (05/2014) RA 13, RV 54/4/11, PA 60/22 (37), PCWP 17, Fick CO/CI: 4.4 / 1.9, Thermo CO/CI: 3.8 /1.6, PVR 5.2 WU, PA 57% and 59%  . Diverticulosis of colon    . Gout   . Hemorrhoids, internal   . Hyperlipidemia   . Hypertension   . Morbid obesity (HCC)   . Nonischemic cardiomyopathy (HCC)    a. LHC (02/2011) normal coronaries  . OSA on CPAP   . Paroxysmal atrial fibrillation (HCC)   . Pneumonia 2000s X 1  . Pulmonary hypertension (HCC)   . Renal insufficiency   . Type II diabetes mellitus (HCC)   . Ventricular tachycardia [facility])    s/p MDT ICD implant   Past Surgical History:  Procedure Laterality Date  . CARDIAC CATHETERIZATION  03/08/2004   EF 25-30%  . CARDIOVERSION N/A 10/17/2015   Procedure: CARDIOVERSION;  Surgeon: [name], MD;  Location: [facility] ENDOSCOPY;  Service: Cardiovascular;  Laterality: N/A;  . EP IMPLANTABLE DEVICE N/A 11/16/2015   Procedure: BiVI Upgrade;  Surgeon: Hillis Range, MD;  [employer] [address] [place]  . INSERTION OF ICD  06/2008   ICD- [employer]   . RIGHT HEART CATHETERIZATION N/A 05/23/2014   Procedure: RIGHT HEART CATH;  Surgeon: [name], MD;  Location: [facility] CATH LAB;  Service: Cardiovascular;  Laterality: N/A;  . TRANSTHORACIC ECHOCARDIOGRAM  05/27/2008   EF 30-35%  . US ECHOCARDIOGRAPHY  08/22/2008   EF 30-35%    Current Outpatient Prescriptions  Medication Sig Dispense Refill  . albuterol (PROAIR HFA) 108 (90 BASE) MCG/ACT inhaler Inhale 2 puffs into  the lungs every 6 (six) hours as needed for wheezing or shortness of breath. Reported on 02/06/2016    . allopurinol (ZYLOPRIM) 300 MG tablet Take 300 mg by mouth daily.    Marland Kitchen. atorvastatin (LIPITOR) 40 MG tablet TAKE 1 TABLET (40 MG TOTAL) BY MOUTH DAILY. 90 tablet 2  . bisoprolol (ZEBETA) 5 MG tablet Take 1 tablet (5 mg total) by mouth daily. 90 tablet 3  . colchicine 0.6 MG tablet Take 0.6 mg by mouth daily as needed (for gout flares). Reported on 02/06/2016    . digoxin (LANOXIN) 0.125 MG tablet Take 0.5 tablets (0.0625 mg total) by mouth every other day. 45 tablet 3  . Fluticasone-Salmeterol (ADVAIR DISKUS) 250-50 MCG/DOSE AEPB Inhale 1 puff into the  lungs every 12 (twelve) hours as needed (shortness of breath). Reported on 02/06/2016    . hydrALAZINE (APRESOLINE) 25 MG tablet Take 1.5 tablets (37.5 mg total) by mouth 3 (three) times daily. 405 tablet 3  . insulin detemir (LEVEMIR) 100 UNIT/ML injection Inject 36 Units into the skin at bedtime.     . isosorbide mononitrate (IMDUR) 30 MG 24 hr tablet TAKE 1 TABLET (30 MG TOTAL) BY MOUTH 2 TIMES DAILY. 180 tablet 3  . linagliptin (TRADJENTA) 5 MG TABS tablet Take 1 tablet (5 mg total) by mouth daily. 30 tablet 3  . metolazone (ZAROXOLYN) 2.5 MG tablet Take 2.5 mg by mouth daily as needed (edema). Reported on 02/06/2016    . montelukast (SINGULAIR) 10 MG tablet Take 10 mg by mouth daily as needed (for shortness of breath or wheezing).     . rivaroxaban (XARELTO) 20 MG TABS tablet Take 1 tablet (20 mg total) by mouth daily with supper. 90 tablet 3  . sacubitril-valsartan (ENTRESTO) 49-51 MG Take 1 tablet by mouth 2 (two) times daily. 180 tablet 3  . spironolactone (ALDACTONE) 25 MG tablet Take 12.5 mg by mouth daily.     Marland Kitchen. torsemide (DEMADEX) 20 MG tablet Take 40 mg (2 tabs) by mouth in the am and 20 mg (1 tab) in the pm    . acetaminophen (TYLENOL) 325 MG tablet Take 325-650 mg by mouth every 6 (six) hours as needed (for pain). Reported on 10/11/2015     No current facility-administered medications for this encounter.     No Known Allergies  Social History   Social History  . Marital status: Single    Spouse name: N/A  . Number of children: 4  . Years of education: N/A   Occupational History  .  Disabled   Social History Main Topics  . Smoking status: Never Smoker  . Smokeless tobacco: Never Used  . Alcohol use Yes     Comment: former heavy ETOH, quit in "the late '90s"  . Drug use: No  . Sexual activity: Not Currently   Other Topics Concern  . Not on file   Social History Narrative   disabled    Family History  Problem Relation Age of Onset  . Cancer Father   . Diabetes  Unknown        grandparents  . Hypertension Mother   . CAD Unknown        No family history    ROS- All systems are reviewed and negative except as per the HPI above  Physical Exam: Vitals:   07/23/17 0929  BP: 112/64  Pulse: 81  Weight: (!) 302 lb 12.8 oz (137.3 kg)  Height: 5\' 5"  (1.651 m)    GEN- The patient  is well appearing, alert and oriented x 3 today.   Head- normocephalic, atraumatic Eyes-  Sclera clear, conjunctiva pink Ears- hearing intact Oropharynx- clear Neck- supple, no JVP Lymph- no cervical lymphadenopathy Lungs- Clear to ausculation bilaterally, normal work of breathing Heart- Regular paced rhythm, no murmurs, rubs or gallops, PMI not laterally displaced GI- soft, NT, ND, + BS Extremities- no clubbing, cyanosis, or edema MS- no significant deformity or atrophy Skin- no rash or lesion Psych- euthymic mood, full affect Neuro- strength and sensation are intact  EKG- ventricular paced at 81 bpm, qrs int 150 ms, qtc 443 ms Epic records reviewed Leta Jungling with [employer] in to interrogate  and shows aflutter since 10/6. His v pacing is currently at 78 % down form 98 % since arrhythmia present.Optivol at a steady upward trend since that time.   Assessment and Plan: 1. Atrial flutter since 10/6 Probably 2/2 dietary indiscretion while vacationing in [place] for 2 1/2 weeks V pacing is down and he is not getting benefit form BIV pacing Discussed pursuing cardioversion but he does not want to purse yet Feels if he is given a few days to straighten out his diet/ lose the fluid with increase of diuretics, he will not need cardioversion He will increase torsemide to 40 mg bid until he sees Dr. Gala Romney 10/22 States no missed doses of xarelto Reminded to keep daily fluids to 2L, he has been drinking a lot of water at night for dry mouth associated with CPAP  F/u with echo at 8 am 10/22 as scheduled and then with Dr. Gala Romney at [facility] [address] [place], [place] [address] [phone]

## 2017-07-23 NOTE — Patient Instructions (Signed)
Your physician has recommended you make the following change in your medication:  1)Increase torsemide to 40mg  twice a day until you see Dr. Jones Broom on Monday the 22nd.

## 2017-07-27 ENCOUNTER — Ambulatory Visit (HOSPITAL_COMMUNITY)
Admission: RE | Admit: 2017-07-27 | Discharge: 2017-07-27 | Disposition: A | Discharge status: Home / Self Care | Payer: Medicare Other | Source: Ambulatory Visit | Attending: Student | Admitting: Student

## 2017-07-27 ENCOUNTER — Encounter (HOSPITAL_COMMUNITY): Payer: Self-pay | Admitting: *Deleted

## 2017-07-27 ENCOUNTER — Encounter (HOSPITAL_COMMUNITY): Payer: Self-pay | Admitting: Internal Medicine

## 2017-07-27 ENCOUNTER — Ambulatory Visit (HOSPITAL_BASED_OUTPATIENT_CLINIC_OR_DEPARTMENT_OTHER)
Admission: RE | Admit: 2017-07-27 | Discharge: 2017-07-27 | Disposition: A | Discharge status: Home / Self Care | Payer: Medicare Other | Source: Ambulatory Visit | Attending: Internal Medicine | Admitting: Internal Medicine

## 2017-07-27 ENCOUNTER — Other Ambulatory Visit (HOSPITAL_COMMUNITY): Payer: Self-pay | Admitting: *Deleted

## 2017-07-27 VITALS — BP 125/65 | HR 78 | Wt 297.8 lb

## 2017-07-27 DIAGNOSIS — E669 Obesity, unspecified: Secondary | ICD-10-CM | POA: Diagnosis not present

## 2017-07-27 DIAGNOSIS — I081 Rheumatic disorders of both mitral and tricuspid valves: Secondary | ICD-10-CM | POA: Insufficient documentation

## 2017-07-27 DIAGNOSIS — E785 Hyperlipidemia, unspecified: Secondary | ICD-10-CM | POA: Diagnosis not present

## 2017-07-27 DIAGNOSIS — Z79899 Other long term (current) drug therapy: Secondary | ICD-10-CM | POA: Diagnosis not present

## 2017-07-27 DIAGNOSIS — I5022 Chronic systolic (congestive) heart failure: Secondary | ICD-10-CM | POA: Insufficient documentation

## 2017-07-27 DIAGNOSIS — I4892 Unspecified atrial flutter: Secondary | ICD-10-CM

## 2017-07-27 DIAGNOSIS — I472 Ventricular tachycardia: Secondary | ICD-10-CM | POA: Diagnosis not present

## 2017-07-27 DIAGNOSIS — Z794 Long term (current) use of insulin: Secondary | ICD-10-CM | POA: Insufficient documentation

## 2017-07-27 DIAGNOSIS — E1122 Type 2 diabetes mellitus with diabetic chronic kidney disease: Secondary | ICD-10-CM | POA: Diagnosis not present

## 2017-07-27 DIAGNOSIS — G4733 Obstructive sleep apnea (adult) (pediatric): Secondary | ICD-10-CM | POA: Insufficient documentation

## 2017-07-27 DIAGNOSIS — I130 Hypertensive heart and chronic kidney disease with heart failure and stage 1 through stage 4 chronic kidney disease, or unspecified chronic kidney disease: Secondary | ICD-10-CM | POA: Diagnosis not present

## 2017-07-27 DIAGNOSIS — Z95810 Presence of automatic (implantable) cardiac defibrillator: Secondary | ICD-10-CM | POA: Insufficient documentation

## 2017-07-27 DIAGNOSIS — I480 Paroxysmal atrial fibrillation: Secondary | ICD-10-CM | POA: Diagnosis not present

## 2017-07-27 DIAGNOSIS — J45909 Unspecified asthma, uncomplicated: Secondary | ICD-10-CM | POA: Diagnosis not present

## 2017-07-27 DIAGNOSIS — N183 Chronic kidney disease, stage 3 (moderate): Secondary | ICD-10-CM | POA: Diagnosis not present

## 2017-07-27 DIAGNOSIS — Z7901 Long term (current) use of anticoagulants: Secondary | ICD-10-CM | POA: Insufficient documentation

## 2017-07-27 DIAGNOSIS — I272 Pulmonary hypertension, unspecified: Secondary | ICD-10-CM | POA: Diagnosis not present

## 2017-07-27 LAB — BASIC METABOLIC PANEL
ANION GAP: 9 (ref 5–15)
BUN: 46 mg/dL — ABNORMAL HIGH (ref 6–20)
CALCIUM: 9.5 mg/dL (ref 8.9–10.3)
CHLORIDE: 101 mmol/L (ref 101–111)
CO2: 26 mmol/L (ref 22–32)
CREATININE: 1.6 mg/dL — AB (ref 0.61–1.24)
GFR calc Af Amer: 54 mL/min — ABNORMAL LOW (ref >60)
GFR calc non Af Amer: 47 mL/min — ABNORMAL LOW (ref >60)
GLUCOSE: 176 mg/dL — AB (ref 65–99)
Potassium: 3.6 mmol/L (ref 3.5–5.1)
Sodium: 136 mmol/L (ref 135–145)

## 2017-07-27 LAB — CBC
HCT: 36.8 % — ABNORMAL LOW (ref 39.0–52.0)
HEMOGLOBIN: 11.9 g/dL — AB (ref 13.0–17.0)
MCH: 28.7 pg (ref 26.0–34.0)
MCHC: 32.3 g/dL (ref 30.0–36.0)
MCV: 88.9 fL (ref 78.0–100.0)
Platelets: 208 10*3/uL (ref 150–400)
RBC: 4.14 MIL/uL — ABNORMAL LOW (ref 4.22–5.81)
RDW: 15.6 % — ABNORMAL HIGH (ref 11.5–15.5)
WBC: 7.7 10*3/uL (ref 4.0–10.5)

## 2017-07-27 LAB — PROTIME-INR
INR: 1.94
Prothrombin Time: 22 seconds — ABNORMAL HIGH (ref 11.4–15.2)

## 2017-07-27 NOTE — Patient Instructions (Signed)
Labs today  Your physician has recommended that you have a Cardioversion (DCCV). Electrical Cardioversion uses a jolt of electricity to your heart either through paddles or wired patches attached to your chest. This is a controlled, usually prescheduled, procedure. Defibrillation is done under light anesthesia in the hospital, and you usually go home the day of the procedure. This is done to get your heart back into a normal rhythm. You are not awake for the procedure. Please see the instruction sheet given to you today.  Your physician recommends that you schedule a follow-up appointment in: 4 weeks

## 2017-07-27 NOTE — Progress Notes (Signed)
Patient ID: Gary Hutchinson, male   DOB: 10/22/1960, 56 y.o.   MRN: [id number]    Advanced Heart Failure Clinic Note   PCP: Creola Corn EP: Hillis Range  HPI: Gary Hutchinson is a 56 year old male with a history of chronic systolic heart failure, ICM s/p ICD, HTN, DM II, OSA on CPAP, history of ventricular tachycardia status post AICD placement in 2009, atrial fibrillation and morbid obesity.   Admitted 6/1-03/13/14 for A/C HF and cardiogenic shock (co-ox 42%). Diuresed with IV lasix and milrinone which were weaned off.  He went into Afib with RVR and chemically cardioverted back to NSR with IV amiodarone. Placed on Xarelto. BB restarted 1/2 dose and held ACE-I. His blood sugars were elevated along with Hgb A1C and started on insulin. Discharge weight 344 lbs.   RHC (05/2014): Left sided filling pressures well compensated, mild pulm HTN with elevated right-sided pressures and mod/severely depressed CO. Discussion about trial of milrinone but patient wanted to hold off.  Admitted January 2017 with volume overload and cellulitis. Diuresed with IV lasix and required short term milrinone. Loaded on amio and had successful DC-CV on  10/17/2015.  Discharge weight was 307 pounds.   Admitted 2/18 with Influenza A/B. 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 Seen by EP and managed conservatively. Received IVF in hospital for sepsis but not diuresed back down afterward. Hydralazine and Imdur also stopped.   Today he returns for HF follow up. Back in a fib since October 6th. Overall feeling ok. Denies PND/Orthopnea. Able to walk up steps. Has been eating high salt foods. He has not been weighing. Started walking again. Walking about 4000 steps a day.  Taking all medications.   Optivol -Fluid index above threshold. In a fib since Oct 6.  Activity ~ 2 hours    05/03/12: EF 25-30%.  Grade 1 diastolic dysfunction.  Mild MR.  Mod dilated LA.   07/27/13: EF 40%, diff HK, MR mild, LA mild/mod dilated  03/2014: EF30-35%, grade II DD, mild MR, RV systolic fx mildly decreased 10/2014: EF 40%.  10/2015: EF ~40%. RV mildly dilated.  8/17 EF 25-35% 07/27/2017 EF  25- 30%   SH: Lives with son in [place]. Disabled. No ETOH or tobacco abuse  FH; Mother living: HT        Father deceased: was not part of his life so not sure health issues  Review of systems complete and found to be negative unless listed in HPI.    Past Medical History:  Diagnosis Date  . AICD (automatic cardioverter/defibrillator) present   . Alcohol abuse    now quit  . Anemia    iron defi  . Arthritis    "fingers, knees, some days all my joints ache" (11/16/2015)  . Asthma   . Cholecystitis   . Chronic systolic congestive heart failure (HCC)    a. RHC (02/2011) RA 31/27, RV 71/27, PA67/45, PCWP 46, PA 49%, Fick CO: 3.4 b. ECHO (03/2014) EF 30-35%, grade II DD, mild MR, RV poorly visualized appears mildly decreased c. RHC (05/2014) RA 13, RV 54/4/11, PA 60/22 (37), PCWP 17, Fick CO/CI: 4.4 / 1.9, Thermo CO/CI: 3.8 /1.6, PVR 5.2 WU, PA 57% and 59%  . Diverticulosis of colon   . Gout   . Hemorrhoids, internal   . Hyperlipidemia   . Hypertension   . Morbid obesity (HCC)   . Nonischemic cardiomyopathy (HCC)    a. LHC (02/2011) normal coronaries  . OSA on CPAP   .  Paroxysmal atrial fibrillation (HCC)   . Pneumonia 2000s X 1  . Pulmonary hypertension (HCC)   . Renal insufficiency   . Type II diabetes mellitus (HCC)   . Ventricular tachycardia [facility](HCC)    s/p MDT ICD implant    Current Outpatient Prescriptions  Medication Sig Dispense Refill  . acetaminophen (TYLENOL) 325 MG tablet Take 325-650 mg by mouth every 6 (six) hours as needed (for pain). Reported on 10/11/2015    . albuterol (PROAIR HFA) 108 (90 BASE) MCG/ACT inhaler Inhale 2 puffs into the lungs every 6 (six) hours as needed for wheezing or shortness of breath. Reported on 02/06/2016    . allopurinol (ZYLOPRIM) 300 MG tablet Take 300 mg by mouth daily.    Marland Kitchen.  atorvastatin (LIPITOR) 40 MG tablet TAKE 1 TABLET (40 MG TOTAL) BY MOUTH DAILY. 90 tablet 2  . bisoprolol (ZEBETA) 5 MG tablet Take 1 tablet (5 mg total) by mouth daily. 90 tablet 3  . colchicine 0.6 MG tablet Take 0.6 mg by mouth daily as needed (for gout flares). Reported on 02/06/2016    . digoxin (LANOXIN) 0.125 MG tablet Take 0.5 tablets (0.0625 mg total) by mouth every other day. 45 tablet 3  . Fluticasone-Salmeterol (ADVAIR DISKUS) 250-50 MCG/DOSE AEPB Inhale 1 puff into the lungs every 12 (twelve) hours as needed (shortness of breath). Reported on 02/06/2016    . hydrALAZINE (APRESOLINE) 25 MG tablet Take 1.5 tablets (37.5 mg total) by mouth 3 (three) times daily. 405 tablet 3  . insulin detemir (LEVEMIR) 100 UNIT/ML injection Inject 36 Units into the skin at bedtime.     . isosorbide mononitrate (IMDUR) 30 MG 24 hr tablet TAKE 1 TABLET (30 MG TOTAL) BY MOUTH 2 TIMES DAILY. 180 tablet 3  . linagliptin (TRADJENTA) 5 MG TABS tablet Take 1 tablet (5 mg total) by mouth daily. 30 tablet 3  . metolazone (ZAROXOLYN) 2.5 MG tablet Take 2.5 mg by mouth daily as needed (edema). Reported on 02/06/2016    . montelukast (SINGULAIR) 10 MG tablet Take 10 mg by mouth daily as needed (for shortness of breath or wheezing).     . rivaroxaban (XARELTO) 20 MG TABS tablet Take 1 tablet (20 mg total) by mouth daily with supper. 90 tablet 3  . sacubitril-valsartan (ENTRESTO) 49-51 MG Take 1 tablet by mouth 2 (two) times daily. 180 tablet 3  . spironolactone (ALDACTONE) 25 MG tablet Take 12.5 mg by mouth daily.     Marland Kitchen. torsemide (DEMADEX) 20 MG tablet Take 40 mg (2 tabs) by mouth in the am and 20 mg (1 tab) in the pm     No current facility-administered medications for this encounter.    Vitals:   07/27/17 0942  BP: 125/65  Pulse: 78  SpO2: 99%  Weight: 297 lb 12.8 oz (135.1 kg)     Wt Readings from Last 3 Encounters:  07/27/17 297 lb 12.8 oz (135.1 kg)  07/23/17 (!) 302 lb 12.8 oz (137.3 kg)  06/23/17 296 lb  (134.3 kg)    PHYSICAL EXAM: General:  Well appearing. No resp difficulty HEENT: normal. anicteric Neck: supple. JVP 8-9. Hard to assess due to body habitus.  Carotids 2+ bilat; no bruits. No lymphadenopathy or thryomegaly appreciated. Cor: PMI nondisplaced. Regular rate & rhythm. No rubs, gallops or murmurs. Lungs: clear Abdomen: obese, soft, nontender, nondistended. No hepatosplenomegaly. No bruits or masses. Good bowel sounds. Extremities: no cyanosis, clubbing, rash, edema. warm Neuro: alert & orientedx3, cranial nerves grossly intact. moves all 4  extremities w/o difficulty. Affect pleasant   ASSESSMENT & PLAN:   1) Chronic systolic HF: NICM s/p BiV [employer], EF 25% 06/03/2016.   NYHA II. Volume status elevated on optivol. Out of rhythm since 10/6.  Continue torsemide 40 mg twice a day +metoalzone as needed.  Volume status should  - Continue entresto 49-51 twice a day. Consider increasing at the next visit.  - Continue bisoprolol 5 mg daily.  - Continue digoxin 0.0625 mg every other day. - Continue hydralazine 37.5 mg TID - Continue imdur 30 mg BID.  - BMET today and in 10 days.  - Reinforced fluid restriction to < 2 L daily, sodium restriction to less than 2000 mg daily, and the importance of daily weights.   2) CKD stage III - Followed by nephrology.  Check BMET today.  3) HTN  - Meds as above. Current regimen.   4) OSA  - Continue nightly CPAP.   5) PAF/AFL-  S/P Succesful DC-CV 10/17/2015  (Initial episode 03/2014) - Recurrent A fib 10/2015 with successful DC/CV on 10/17/15.  - Had episode of AFL with ICD shock in 2/18 at time of influenza - Back in A Flutter. Set up DC-CV.   - Now off amiodarone with thyrotoxicity. Evaluated by EP and for now will follow. If he has further AF will need to consider tikosyn or ablation. 6) Obesity:   - Encouraged to try and lose weight and increase activity.   7) Amiodarone thyroxicity   - Off Amiodarone. Completed Methimazole treatment per  PCP.    Follow up in 4 weeks.   Tonye Becket, NP  07/27/2017 9:45 AM   Patient seen and examined with Tonye Becket, NP. We discussed all aspects of the encounter. I agree with the assessment and plan as stated above.   Overall doing fairly well but back in AFL. Echo reviewed personally and EF 30-35%. ICD interrogated and fluid coming down but still elevated. Will plan for DC-CV on Wednesday. Discussed need for closer volume management.   Arvilla Meres, MD  11:20 AM

## 2017-07-27 NOTE — Progress Notes (Signed)
  Echocardiogram 2D Echocardiogram has been performed.  [name] 07/27/2017, 8:52 AM

## 2017-07-28 LAB — ECHOCARDIOGRAM COMPLETE
CHL CUP DOP CALC LVOT VTI: 15.5 cm
CHL CUP MV DEC (S): 183
CHL CUP TV REG PEAK VELOCITY: 267 cm/s
E decel time: 183 msec
E/e' ratio: 4.94
FS: 23 % — AB (ref 28–44)
IV/PV OW: 0.76
LA diam end sys: 47 mm
LA vol index: 55.5 mL/m2
LADIAMINDEX: 1.99 cm/m2
LASIZE: 47 mm
LAVOL: 131 mL
LAVOLA4C: 141 mL
LV E/e'average: 4.94
LV PW d: 11.6 mm — AB (ref 0.6–1.1)
LV sys vol index: 50 mL/m2
LV sys vol: 118 mL — AB (ref 21–61)
LVDIAVOL: 193 mL — AB (ref 62–150)
LVDIAVOLIN: 82 mL/m2
LVEEMED: 4.94
LVELAT: 14.3 cm/s
LVOT area: 4.52 cm2
LVOT peak grad rest: 2 mmHg
LVOTD: 24 mm
LVOTPV: 79 cm/s
LVOTSV: 70 mL
Lateral S' vel: 9.14 cm/s
MV pk A vel: 48 m/s
MV pk E vel: 70.7 m/s
RV TAPSE: 22.5 mm
RV sys press: 32 mmHg
Simpson's disk: 39
Stroke v: 75 ml
TDI e' lateral: 14.3
TDI e' medial: 7.18
TR max vel: 267 cm/s

## 2017-07-29 ENCOUNTER — Encounter (HOSPITAL_COMMUNITY): Payer: Self-pay

## 2017-07-29 ENCOUNTER — Encounter (HOSPITAL_COMMUNITY): Payer: Self-pay | Admitting: Certified Registered"

## 2017-07-29 ENCOUNTER — Encounter (HOSPITAL_COMMUNITY)
Admission: RE | Disposition: A | Discharge status: Home / Self Care | Payer: Self-pay | Source: Ambulatory Visit | Attending: Internal Medicine

## 2017-07-29 ENCOUNTER — Ambulatory Visit (HOSPITAL_COMMUNITY)
Admission: RE | Admit: 2017-07-29 | Discharge: 2017-07-29 | Disposition: A | Discharge status: Home / Self Care | Payer: Medicare Other | Source: Ambulatory Visit | Attending: Internal Medicine | Admitting: Internal Medicine

## 2017-07-29 DIAGNOSIS — I4892 Unspecified atrial flutter: Secondary | ICD-10-CM | POA: Diagnosis not present

## 2017-07-29 DIAGNOSIS — G4733 Obstructive sleep apnea (adult) (pediatric): Secondary | ICD-10-CM | POA: Diagnosis not present

## 2017-07-29 DIAGNOSIS — Z95810 Presence of automatic (implantable) cardiac defibrillator: Secondary | ICD-10-CM | POA: Insufficient documentation

## 2017-07-29 DIAGNOSIS — J45909 Unspecified asthma, uncomplicated: Secondary | ICD-10-CM | POA: Diagnosis not present

## 2017-07-29 DIAGNOSIS — I130 Hypertensive heart and chronic kidney disease with heart failure and stage 1 through stage 4 chronic kidney disease, or unspecified chronic kidney disease: Secondary | ICD-10-CM | POA: Insufficient documentation

## 2017-07-29 DIAGNOSIS — M19049 Primary osteoarthritis, unspecified hand: Secondary | ICD-10-CM | POA: Insufficient documentation

## 2017-07-29 DIAGNOSIS — I429 Cardiomyopathy, unspecified: Secondary | ICD-10-CM | POA: Insufficient documentation

## 2017-07-29 DIAGNOSIS — Z79899 Other long term (current) drug therapy: Secondary | ICD-10-CM | POA: Insufficient documentation

## 2017-07-29 DIAGNOSIS — Z6841 Body Mass Index (BMI) 40.0 and over, adult: Secondary | ICD-10-CM | POA: Insufficient documentation

## 2017-07-29 DIAGNOSIS — E1122 Type 2 diabetes mellitus with diabetic chronic kidney disease: Secondary | ICD-10-CM | POA: Diagnosis not present

## 2017-07-29 DIAGNOSIS — I480 Paroxysmal atrial fibrillation: Secondary | ICD-10-CM | POA: Diagnosis not present

## 2017-07-29 DIAGNOSIS — Z794 Long term (current) use of insulin: Secondary | ICD-10-CM | POA: Insufficient documentation

## 2017-07-29 DIAGNOSIS — Z7901 Long term (current) use of anticoagulants: Secondary | ICD-10-CM | POA: Insufficient documentation

## 2017-07-29 DIAGNOSIS — Z538 Procedure and treatment not carried out for other reasons: Secondary | ICD-10-CM | POA: Diagnosis not present

## 2017-07-29 DIAGNOSIS — E785 Hyperlipidemia, unspecified: Secondary | ICD-10-CM | POA: Insufficient documentation

## 2017-07-29 DIAGNOSIS — N183 Chronic kidney disease, stage 3 (moderate): Secondary | ICD-10-CM | POA: Insufficient documentation

## 2017-07-29 DIAGNOSIS — I5022 Chronic systolic (congestive) heart failure: Secondary | ICD-10-CM | POA: Diagnosis not present

## 2017-07-29 DIAGNOSIS — M109 Gout, unspecified: Secondary | ICD-10-CM | POA: Insufficient documentation

## 2017-07-29 DIAGNOSIS — I272 Pulmonary hypertension, unspecified: Secondary | ICD-10-CM | POA: Diagnosis not present

## 2017-07-29 DIAGNOSIS — M170 Bilateral primary osteoarthritis of knee: Secondary | ICD-10-CM | POA: Diagnosis not present

## 2017-07-29 LAB — GLUCOSE, CAPILLARY: GLUCOSE-CAPILLARY: 165 mg/dL — AB (ref 65–99)

## 2017-07-29 SURGERY — CANCELLED PROCEDURE

## 2017-07-29 MED ORDER — PROPOFOL 10 MG/ML IV BOLUS
INTRAVENOUS | Status: AC
  Filled 2017-07-29: qty 20

## 2017-07-29 MED ORDER — LIDOCAINE 2% (20 MG/ML) 5 ML SYRINGE
INTRAMUSCULAR | Status: AC
  Filled 2017-07-29: qty 5

## 2017-07-29 NOTE — OR Nursing (Signed)
[employer] interrogated ICD and found to be in NSR

## 2017-07-29 NOTE — H&P (View-Only) (Signed)
Patient ID: RACHARD ISIDRO, male   DOB: 10/22/1960, 56 y.o.   MRN: [id number]    Advanced Heart Failure Clinic Note   PCP: Creola Corn EP: Hillis Range  HPI: [name] is a 56 year old male with a history of chronic systolic heart failure, ICM s/p ICD, HTN, DM II, OSA on CPAP, history of ventricular tachycardia status post AICD placement in 2009, atrial fibrillation and morbid obesity.   Admitted 6/1-03/13/14 for A/C HF and cardiogenic shock (co-ox 42%). Diuresed with IV lasix and milrinone which were weaned off.  He went into Afib with RVR and chemically cardioverted back to NSR with IV amiodarone. Placed on Xarelto. BB restarted 1/2 dose and held ACE-I. His blood sugars were elevated along with Hgb A1C and started on insulin. Discharge weight 344 lbs.   RHC (05/2014): Left sided filling pressures well compensated, mild pulm HTN with elevated right-sided pressures and mod/severely depressed CO. Discussion about trial of milrinone but patient wanted to hold off.  Admitted January 2017 with volume overload and cellulitis. Diuresed with IV lasix and required short term milrinone. Loaded on amio and had successful DC-CV on  10/17/2015.  Discharge weight was 307 pounds.   Admitted 2/18 with Influenza A/B. 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 Seen by EP and managed conservatively. Received IVF in hospital for sepsis but not diuresed back down afterward. Hydralazine and Imdur also stopped.   Today he returns for HF follow up. Back in a fib since October 6th. Overall feeling ok. Denies PND/Orthopnea. Able to walk up steps. Has been eating high salt foods. He has not been weighing. Started walking again. Walking about 4000 steps a day.  Taking all medications.   Optivol -Fluid index above threshold. In a fib since Oct 6.  Activity ~ 2 hours    05/03/12: EF 25-30%.  Grade 1 diastolic dysfunction.  Mild MR.  Mod dilated LA.   07/27/13: EF 40%, diff HK, MR mild, LA mild/mod dilated  03/2014: EF30-35%, grade II DD, mild MR, RV systolic fx mildly decreased 10/2014: EF 40%.  10/2015: EF ~40%. RV mildly dilated.  8/17 EF 25-35% 07/27/2017 EF  25- 30%   SH: Lives with son in [place]. Disabled. No ETOH or tobacco abuse  FH; Mother living: HT        Father deceased: was not part of his life so not sure health issues  Review of systems complete and found to be negative unless listed in HPI.    Past Medical History:  Diagnosis Date  . AICD (automatic cardioverter/defibrillator) present   . Alcohol abuse    now quit  . Anemia    iron defi  . Arthritis    "fingers, knees, some days all my joints ache" (11/16/2015)  . Asthma   . Cholecystitis   . Chronic systolic congestive heart failure (HCC)    a. RHC (02/2011) RA 31/27, RV 71/27, PA67/45, PCWP 46, PA 49%, Fick CO: 3.4 b. ECHO (03/2014) EF 30-35%, grade II DD, mild MR, RV poorly visualized appears mildly decreased c. RHC (05/2014) RA 13, RV 54/4/11, PA 60/22 (37), PCWP 17, Fick CO/CI: 4.4 / 1.9, Thermo CO/CI: 3.8 /1.6, PVR 5.2 WU, PA 57% and 59%  . Diverticulosis of colon   . Gout   . Hemorrhoids, internal   . Hyperlipidemia   . Hypertension   . Morbid obesity (HCC)   . Nonischemic cardiomyopathy (HCC)    a. LHC (02/2011) normal coronaries  . OSA on CPAP   .  Paroxysmal atrial fibrillation (HCC)   . Pneumonia 2000s X 1  . Pulmonary hypertension (HCC)   . Renal insufficiency   . Type II diabetes mellitus (HCC)   . Ventricular tachycardia [facility](HCC)    s/p MDT ICD implant    Current Outpatient Prescriptions  Medication Sig Dispense Refill  . acetaminophen (TYLENOL) 325 MG tablet Take 325-650 mg by mouth every 6 (six) hours as needed (for pain). Reported on 10/11/2015    . albuterol (PROAIR HFA) 108 (90 BASE) MCG/ACT inhaler Inhale 2 puffs into the lungs every 6 (six) hours as needed for wheezing or shortness of breath. Reported on 02/06/2016    . allopurinol (ZYLOPRIM) 300 MG tablet Take 300 mg by mouth daily.    Marland Kitchen.  atorvastatin (LIPITOR) 40 MG tablet TAKE 1 TABLET (40 MG TOTAL) BY MOUTH DAILY. 90 tablet 2  . bisoprolol (ZEBETA) 5 MG tablet Take 1 tablet (5 mg total) by mouth daily. 90 tablet 3  . colchicine 0.6 MG tablet Take 0.6 mg by mouth daily as needed (for gout flares). Reported on 02/06/2016    . digoxin (LANOXIN) 0.125 MG tablet Take 0.5 tablets (0.0625 mg total) by mouth every other day. 45 tablet 3  . Fluticasone-Salmeterol (ADVAIR DISKUS) 250-50 MCG/DOSE AEPB Inhale 1 puff into the lungs every 12 (twelve) hours as needed (shortness of breath). Reported on 02/06/2016    . hydrALAZINE (APRESOLINE) 25 MG tablet Take 1.5 tablets (37.5 mg total) by mouth 3 (three) times daily. 405 tablet 3  . insulin detemir (LEVEMIR) 100 UNIT/ML injection Inject 36 Units into the skin at bedtime.     . isosorbide mononitrate (IMDUR) 30 MG 24 hr tablet TAKE 1 TABLET (30 MG TOTAL) BY MOUTH 2 TIMES DAILY. 180 tablet 3  . linagliptin (TRADJENTA) 5 MG TABS tablet Take 1 tablet (5 mg total) by mouth daily. 30 tablet 3  . metolazone (ZAROXOLYN) 2.5 MG tablet Take 2.5 mg by mouth daily as needed (edema). Reported on 02/06/2016    . montelukast (SINGULAIR) 10 MG tablet Take 10 mg by mouth daily as needed (for shortness of breath or wheezing).     . rivaroxaban (XARELTO) 20 MG TABS tablet Take 1 tablet (20 mg total) by mouth daily with supper. 90 tablet 3  . sacubitril-valsartan (ENTRESTO) 49-51 MG Take 1 tablet by mouth 2 (two) times daily. 180 tablet 3  . spironolactone (ALDACTONE) 25 MG tablet Take 12.5 mg by mouth daily.     Marland Kitchen. torsemide (DEMADEX) 20 MG tablet Take 40 mg (2 tabs) by mouth in the am and 20 mg (1 tab) in the pm     No current facility-administered medications for this encounter.    Vitals:   07/27/17 0942  BP: 125/65  Pulse: 78  SpO2: 99%  Weight: 297 lb 12.8 oz (135.1 kg)     Wt Readings from Last 3 Encounters:  07/27/17 297 lb 12.8 oz (135.1 kg)  07/23/17 (!) 302 lb 12.8 oz (137.3 kg)  06/23/17 296 lb  (134.3 kg)    PHYSICAL EXAM: General:  Well appearing. No resp difficulty HEENT: normal. anicteric Neck: supple. JVP 8-9. Hard to assess due to body habitus.  Carotids 2+ bilat; no bruits. No lymphadenopathy or thryomegaly appreciated. Cor: PMI nondisplaced. Regular rate & rhythm. No rubs, gallops or murmurs. Lungs: clear Abdomen: obese, soft, nontender, nondistended. No hepatosplenomegaly. No bruits or masses. Good bowel sounds. Extremities: no cyanosis, clubbing, rash, edema. warm Neuro: alert & orientedx3, cranial nerves grossly intact. moves all 4  extremities w/o difficulty. Affect pleasant   ASSESSMENT & PLAN:   1) Chronic systolic HF: NICM s/p BiV Medtronic, EF 25% 06/03/2016.   NYHA II. Volume status elevated on optivol. Out of rhythm since 10/6.  Continue torsemide 40 mg twice a day +metoalzone as needed.  Volume status should  - Continue entresto 49-51 twice a day. Consider increasing at the next visit.  - Continue bisoprolol 5 mg daily.  - Continue digoxin 0.0625 mg every other day. - Continue hydralazine 37.5 mg TID - Continue imdur 30 mg BID.  - BMET today and in 10 days.  - Reinforced fluid restriction to < 2 L daily, sodium restriction to less than 2000 mg daily, and the importance of daily weights.   2) CKD stage III - Followed by nephrology.  Check BMET today.  3) HTN  - Meds as above. Current regimen.   4) OSA  - Continue nightly CPAP.   5) PAF/AFL-  S/P Succesful DC-CV 10/17/2015  (Initial episode 03/2014) - Recurrent A fib 10/2015 with successful DC/CV on 10/17/15.  - Had episode of AFL with ICD shock in 2/18 at time of influenza - Back in [place]. Set up DC-CV.   - Now off amiodarone with thyrotoxicity. Evaluated by EP and for now will follow. If he has further AF will need to consider tikosyn or ablation. 6) Obesity:   - Encouraged to try and lose weight and increase activity.   7) Amiodarone thyroxicity   - Off Amiodarone. Completed Methimazole treatment per  PCP.    Follow up in 4 weeks.   Tonye Becket, NP  07/27/2017 9:45 AM   Patient seen and examined with Tonye Becket, NP. We discussed all aspects of the encounter. I agree with the assessment and plan as stated above.   Overall doing fairly well but back in AFL. Echo reviewed personally and EF 30-35%. ICD interrogated and fluid coming down but still elevated. Will plan for DC-CV on Wednesday. Discussed need for closer volume management.   Arvilla Meres, MD  11:20 AM

## 2017-07-29 NOTE — Interval H&P Note (Signed)
History and Physical Interval Note:  07/29/2017 8:52 AM  Gary Hutchinson  has presented today for surgery, with the diagnosis of a fib  The various methods of treatment have been discussed with the patient and family. After consideration of risks, benefits and other options for treatment, the patient has consented to  Procedure(s): CARDIOVERSION (N/A) as a surgical intervention .  The patient's history has been reviewed, patient examined, no change in status, stable for surgery.  I have reviewed the patient's chart and labs.  Questions were answered to the patient's satisfaction.     [name], Reuel Boom

## 2017-08-03 ENCOUNTER — Telehealth: Payer: Self-pay

## 2017-08-03 ENCOUNTER — Ambulatory Visit (INDEPENDENT_AMBULATORY_CARE_PROVIDER_SITE_OTHER): Payer: Medicare Other

## 2017-08-03 DIAGNOSIS — I5022 Chronic systolic (congestive) heart failure: Secondary | ICD-10-CM | POA: Diagnosis not present

## 2017-08-03 DIAGNOSIS — Z95810 Presence of automatic (implantable) cardiac defibrillator: Secondary | ICD-10-CM

## 2017-08-03 NOTE — Telephone Encounter (Signed)
Remote ICM transmission received.  Attempted call to patient and no answer   

## 2017-08-03 NOTE — Progress Notes (Signed)
EPIC Encounter for ICM Monitoring  Patient Name: Gary Hutchinson is a 56 y.o. male Date: 08/03/2017 Primary Care Physican: Shon Baton, MD Primary Cardiologist:[name] Electrophysiologist: Allred Dry Weight: Last weight 292lbs Bi-V Pacing: 93.9 %     Observations (1) (29-Jul-2017 to 03-Aug-2017)  AT/AF >= 1 hr for 1 days.       Attempted call to patient and unable to reach.    Transmission reviewed.    Thoracic impedance abnormal suggesting fluid accumulation since 07/29/2017.  Prescribed dosage: Torsemide 20 mg 2 tablets (40 mg total) in AMand 1 tablet (20 mg total) every PM. Metolazone 2.5 mg 1 tablet as needed.  Labs: 04/27/2017 Creatinine 1.82, BUN 47, Potassium 4.2, Sodium 137, EGFR 40-46 04/13/2017 Creatinine 1.60, BUN 42, Potassium 3.6, Sodium 138, EGFR 47-54 12/29/2016 Creatinine 1.16, BUN 21, Potassium 4.1, Sodium 139, EGFR >60 12/03/2016 Creatinine 1.54, BUN 34, Potassium 4.0, Sodium 136, EGFR 49-57  11/27/2016 Creatinine 1.38, BUN 26, Potassium 3.8, Sodium 138, EGFR 56->60  11/26/2016 Creatinine 1.97, BUN 44, Potassium 3.5, Sodium 138, EGFR 36-42  02/20/2018Creatinine 2.32, BUN 48, Potassium 3.5, Sodium 135, EGFR 30-35  09/10/2016 Creatinine 1.36, BUN 20, Potassium 4.5, Sodium 139, EGFR 57->60 09/03/2016 Creatinine 1.39, BUN 34, Potassium 4.8, Sodium 136, EGFR 56->60  08/06/2016 Creatinine 1.41, BUN 21, Potassium 4.7, Sodium 131, EGFR 55->60  06/24/2016 Creatinine 1.16, BUN 27, Potassium 3.7, Sodium 137, EGFR >60   Recommendations: NONE - Unable to reach patient .  Follow-up plan: ICM clinic phone appointment on 08/13/2017.  Office appointment scheduled 08/25/2017 with HF clinic NP/PA.  Copy of ICM check sent to Dr. Haroldine Laws and Dr. Rayann Heman.   3 month ICM trend: 08/03/2017         AT/AF   1 Year ICM trend:      [name], RN 08/03/2017 1:46 PM

## 2017-08-13 ENCOUNTER — Ambulatory Visit (INDEPENDENT_AMBULATORY_CARE_PROVIDER_SITE_OTHER): Payer: Medicare Other

## 2017-08-13 DIAGNOSIS — I5022 Chronic systolic (congestive) heart failure: Secondary | ICD-10-CM | POA: Diagnosis not present

## 2017-08-13 DIAGNOSIS — Z95810 Presence of automatic (implantable) cardiac defibrillator: Secondary | ICD-10-CM

## 2017-08-13 NOTE — Progress Notes (Signed)
EPIC Encounter for ICM Monitoring  Patient Name: Gary Hutchinson is a 56 y.o. male Date: 08/13/2017 Primary Care Physican: Shon Baton, MD Primary Cardiologist:[name] Electrophysiologist: Allred Dry Weight: 296lbs Bi-V Pacing: 96.7%   Since 03-Aug-2017 AT/AF 1  Time in AT/AF 0.2 hr/day (0.7%)  Longest AT/AF 95 minutes        Heart Failure questions reviewed, pt asymptomatic.   Optivol: Thoracic impedance almost back at baseline.  Impedance pattern of trending below baseline with a few days at baseline.   Prescribed dosage: Torsemide 20 mg 2 tablets (40 mg total) in AMand 1 tablet (20 mg total) every PM. Metolazone 2.5 mg 1 tablet as needed.  Labs: 07/27/2017 Creatinine 1.60, BUN 46, Potassium 3.6, Sodium 136, EGFR 47-54 04/27/2017 Creatinine 1.82, BUN 47, Potassium 4.2, Sodium 137, EGFR 40-46 04/13/2017 Creatinine 1.60, BUN 42, Potassium 3.6, Sodium 138, EGFR 47-54 12/29/2016 Creatinine 1.16, BUN 21, Potassium 4.1, Sodium 139, EGFR >60 12/03/2016 Creatinine 1.54, BUN 34, Potassium 4.0, Sodium 136, EGFR 49-57  11/27/2016 Creatinine 1.38, BUN 26, Potassium 3.8, Sodium 138, EGFR 56->60  11/26/2016 Creatinine 1.97, BUN 44, Potassium 3.5, Sodium 138, EGFR 36-42  02/20/2018Creatinine 2.32, BUN 48, Potassium 3.5, Sodium 135, EGFR 30-35  09/10/2016 Creatinine 1.36, BUN 20, Potassium 4.5, Sodium 139, EGFR 57->60 09/03/2016 Creatinine 1.39, BUN 34, Potassium 4.8, Sodium 136, EGFR 56->60  08/06/2016 Creatinine 1.41, BUN 21, Potassium 4.7, Sodium 131, EGFR 55->60  06/24/2016 Creatinine 1.16, BUN 27, Potassium 3.7, Sodium 137, EGFR >60   Recommendations: No changes.  Symptoms are managed with Metolazone as needed.  Has taken 1 Metolazone since ER visit.  Follow-up plan: ICM clinic phone appointment on 09/14/2017.  Office appointment scheduled 08/25/2017 with HF clinic NP/PA.  Copy of ICM check sent to Dr. Rayann Heman and Dr. Haroldine Laws.   3 month ICM trend: 08/13/2017    AT/AF                1 Year ICM trend:       [name], RN 08/13/2017 12:49 PM

## 2017-08-17 ENCOUNTER — Other Ambulatory Visit (HOSPITAL_COMMUNITY): Payer: Self-pay | Admitting: *Deleted

## 2017-08-17 ENCOUNTER — Other Ambulatory Visit: Payer: Self-pay | Admitting: Internal Medicine

## 2017-08-17 MED ORDER — ISOSORBIDE MONONITRATE ER 30 MG PO TB24: ORAL_TABLET | ORAL | 3 refills | Status: DC

## 2017-08-25 ENCOUNTER — Encounter (HOSPITAL_COMMUNITY): Payer: Medicare Other

## 2017-09-04 ENCOUNTER — Encounter (HOSPITAL_COMMUNITY): Payer: Self-pay

## 2017-09-04 ENCOUNTER — Telehealth (HOSPITAL_COMMUNITY): Payer: Self-pay | Admitting: Cardiology

## 2017-09-04 ENCOUNTER — Ambulatory Visit (HOSPITAL_COMMUNITY)
Admission: RE | Admit: 2017-09-04 | Discharge: 2017-09-04 | Disposition: A | Discharge status: Home / Self Care | Payer: Medicare Other | Source: Ambulatory Visit | Attending: Cardiology | Admitting: Cardiology

## 2017-09-04 VITALS — BP 108/50 | HR 74 | Wt 300.0 lb

## 2017-09-04 DIAGNOSIS — I471 Supraventricular tachycardia: Secondary | ICD-10-CM | POA: Diagnosis not present

## 2017-09-04 DIAGNOSIS — Z95810 Presence of automatic (implantable) cardiac defibrillator: Secondary | ICD-10-CM | POA: Insufficient documentation

## 2017-09-04 DIAGNOSIS — I130 Hypertensive heart and chronic kidney disease with heart failure and stage 1 through stage 4 chronic kidney disease, or unspecified chronic kidney disease: Secondary | ICD-10-CM | POA: Diagnosis not present

## 2017-09-04 DIAGNOSIS — E1122 Type 2 diabetes mellitus with diabetic chronic kidney disease: Secondary | ICD-10-CM | POA: Insufficient documentation

## 2017-09-04 DIAGNOSIS — N183 Chronic kidney disease, stage 3 (moderate): Secondary | ICD-10-CM | POA: Insufficient documentation

## 2017-09-04 DIAGNOSIS — I5022 Chronic systolic (congestive) heart failure: Secondary | ICD-10-CM | POA: Diagnosis present

## 2017-09-04 DIAGNOSIS — E785 Hyperlipidemia, unspecified: Secondary | ICD-10-CM | POA: Diagnosis not present

## 2017-09-04 DIAGNOSIS — I472 Ventricular tachycardia: Secondary | ICD-10-CM | POA: Insufficient documentation

## 2017-09-04 DIAGNOSIS — I272 Pulmonary hypertension, unspecified: Secondary | ICD-10-CM | POA: Diagnosis not present

## 2017-09-04 DIAGNOSIS — Z7901 Long term (current) use of anticoagulants: Secondary | ICD-10-CM | POA: Insufficient documentation

## 2017-09-04 DIAGNOSIS — Z794 Long term (current) use of insulin: Secondary | ICD-10-CM | POA: Diagnosis not present

## 2017-09-04 DIAGNOSIS — I10 Essential (primary) hypertension: Secondary | ICD-10-CM | POA: Diagnosis not present

## 2017-09-04 DIAGNOSIS — G4733 Obstructive sleep apnea (adult) (pediatric): Secondary | ICD-10-CM | POA: Insufficient documentation

## 2017-09-04 DIAGNOSIS — Z79899 Other long term (current) drug therapy: Secondary | ICD-10-CM | POA: Diagnosis not present

## 2017-09-04 DIAGNOSIS — I428 Other cardiomyopathies: Secondary | ICD-10-CM | POA: Insufficient documentation

## 2017-09-04 DIAGNOSIS — I480 Paroxysmal atrial fibrillation: Secondary | ICD-10-CM | POA: Diagnosis not present

## 2017-09-04 DIAGNOSIS — J45909 Unspecified asthma, uncomplicated: Secondary | ICD-10-CM | POA: Diagnosis not present

## 2017-09-04 LAB — BASIC METABOLIC PANEL
ANION GAP: 10 (ref 5–15)
BUN: 75 mg/dL — ABNORMAL HIGH (ref 6–20)
CHLORIDE: 100 mmol/L — AB (ref 101–111)
CO2: 26 mmol/L (ref 22–32)
Calcium: 9.1 mg/dL (ref 8.9–10.3)
Creatinine, Ser: 2.15 mg/dL — ABNORMAL HIGH (ref 0.61–1.24)
GFR calc non Af Amer: 33 mL/min — ABNORMAL LOW (ref >60)
GFR, EST AFRICAN AMERICAN: 38 mL/min — AB (ref >60)
GLUCOSE: 194 mg/dL — AB (ref 65–99)
POTASSIUM: 3.9 mmol/L (ref 3.5–5.1)
Sodium: 136 mmol/L (ref 135–145)

## 2017-09-04 NOTE — Progress Notes (Signed)
Patient ID: Gary Hutchinson, male   DOB: 02/24/1961, 56 y.o.   MRN: [id number]    Advanced Heart Failure Clinic Note   PCP: Gary CornJohn Hutchinson EP: Gary RangeJames Hutchinson  HPI: Gary Hutchinson is a 56 year old male with a history of chronic systolic heart failure, ICM s/p ICD, HTN, DM II, OSA on CPAP, history of ventricular tachycardia status post AICD placement in 2009, atrial fibrillation and morbid obesity.   Admitted 6/1-03/13/14 for A/C HF and cardiogenic shock (co-ox 42%). Diuresed with IV lasix and milrinone which were weaned off.  He went into Afib with RVR and chemically cardioverted back to NSR with IV amiodarone. Placed on Xarelto. BB restarted 1/2 dose and held ACE-I. His blood sugars were elevated along with Hgb A1C and started on insulin. Discharge weight 344 lbs.   RHC (05/2014): Left sided filling pressures well compensated, mild pulm HTN with elevated right-sided pressures and mod/severely depressed CO. Discussion about trial of milrinone but patient wanted to hold off.  Admitted January 2017 with volume overload and cellulitis. Diuresed with IV lasix and required short term milrinone. Loaded on amio and had successful DC-CV on  10/17/2015.  Discharge weight was 307 pounds.   Admitted 2/18 with Influenza A/B. 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 Seen by EP and managed conservatively. Received IVF in hospital for sepsis but not diuresed back down afterward. Hydralazine and Imdur also stopped.   S/p successful DCCV 07/29/2017.  Pt presents today for post DCCV follow up. Has had only short episodes of PAF/SVT since DCCV. Feeling much better. Trying to get his weight down. Has started walking again. Denies DOE/PND/orthopnea. Walking up steps without difficulty. Trying to watch his salt and limit his fluid. Taking all medications as directed.   Optivol interrogated personally: Fluid index below threshold, thoracic impedence trending towards dry. Pt has had 4 episodes of PAF vs SVT since  DCCV. Activity ~ 2 hours.    05/03/12: EF 25-30%.  Grade 1 diastolic dysfunction.  Mild MR.  Mod dilated LA.   07/27/13: EF 40%, diff HK, MR mild, LA mild/mod dilated  03/2014: EF30-35%, grade II DD, mild MR, RV systolic fx mildly decreased 10/2014: EF 40%.  10/2015: EF ~40%. RV mildly dilated.  8/17 EF 25-35% 07/27/2017 EF  25- 30%   [place]abled. No ETOH or tobacco abuse  FH; Mother living: HT        Father deceased: was not part of his life so not sure health issues  Review of systems complete and found to be negative unless listed in HPI.    Past Medical History:  Diagnosis Date  . AICD (automatic cardioverter/defibrillator) present    Medtronic  . Alcohol abuse    now quit  . Anemia    iron defi  . Arthritis    "fingers, knees, some days all my joints ache" (11/16/2015)  . Asthma   . Cholecystitis   . Chronic systolic congestive heart failure (HCC)    a. RHC (02/2011) RA 31/27, RV 71/27, PA67/45, PCWP 46, PA 49%, Fick CO: 3.4 b. ECHO (03/2014) EF 30-35%, grade II DD, mild MR, RV poorly visualized appears mildly decreased c. RHC (05/2014) RA 13, RV 54/4/11, PA 60/22 (37), PCWP 17, Fick CO/CI: 4.4 / 1.9, Thermo CO/CI: 3.8 /1.6, PVR 5.2 WU, PA 57% and 59%  . Diverticulosis of colon   . Gout   . Hemorrhoids, internal   . Hyperlipidemia   . Hypertension   .  Morbid obesity (HCC)   . Nonischemic cardiomyopathy (HCC)    a. LHC (02/2011) normal coronaries  . OSA on CPAP   . Paroxysmal atrial fibrillation (HCC)   . Pneumonia 2000s X 1  . Pulmonary hypertension (HCC)   . Renal insufficiency   . Type II diabetes mellitus (HCC)   . Ventricular tachycardia Gary Hutchinson)    s/p MDT ICD implant    Current Outpatient Medications  Medication Sig Dispense Refill  . acetaminophen (TYLENOL) 325 MG tablet Take 325-650 mg by mouth every 6 (six) hours as needed (for pain). Reported on 10/11/2015    . albuterol (PROAIR HFA) 108 (90 BASE) MCG/ACT inhaler Inhale 2 puffs into  the lungs every 6 (six) hours as needed for wheezing or shortness of breath. Reported on 02/06/2016    . allopurinol (ZYLOPRIM) 300 MG tablet Take 300 mg by mouth daily.    Marland Kitchen atorvastatin (LIPITOR) 40 MG tablet TAKE 1 TABLET (40 MG TOTAL) BY MOUTH DAILY. 90 tablet 2  . bisoprolol (ZEBETA) 5 MG tablet Take 1 tablet (5 mg total) by mouth daily. 90 tablet 3  . colchicine 0.6 MG tablet Take 0.6 mg by mouth daily as needed (for gout flares). Reported on 02/06/2016    . digoxin (LANOXIN) 0.125 MG tablet Take 0.5 tablets (0.0625 mg total) by mouth every other day. 45 tablet 3  . hydrALAZINE (APRESOLINE) 25 MG tablet Take 1.5 tablets (37.5 mg total) by mouth 3 (three) times daily. 405 tablet 3  . insulin detemir (LEVEMIR) 100 UNIT/ML injection Inject 36 Units into the skin at bedtime.     . isosorbide mononitrate (IMDUR) 30 MG 24 hr tablet TAKE 1 TABLET (30 MG TOTAL) BY MOUTH 2 TIMES DAILY. 180 tablet 3  . linagliptin (TRADJENTA) 5 MG TABS tablet Take 1 tablet (5 mg total) by mouth daily. 30 tablet 3  . metolazone (ZAROXOLYN) 2.5 MG tablet Take 2.5 mg by mouth daily as needed (edema). Reported on 02/06/2016    . montelukast (SINGULAIR) 10 MG tablet Take 10 mg by mouth daily as needed (for shortness of breath or wheezing).     . rivaroxaban (XARELTO) 20 MG TABS tablet Take 1 tablet (20 mg total) by mouth daily with supper. 90 tablet 3  . sacubitril-valsartan (ENTRESTO) 49-51 MG Take 1 tablet by mouth 2 (two) times daily. 180 tablet 3  . spironolactone (ALDACTONE) 25 MG tablet Take 12.5 mg by mouth daily.     Marland Kitchen torsemide (DEMADEX) 20 MG tablet Take 40 mg (2 tabs) by mouth in the am and 20 mg (1 tab) in the pm    . Fluticasone-Salmeterol (ADVAIR DISKUS) 250-50 MCG/DOSE AEPB Inhale 1 puff into the lungs every 12 (twelve) hours as needed (shortness of breath). Reported on 02/06/2016     No current facility-administered medications for this encounter.    Vitals:   09/04/17 1041  BP: (!) 108/50  Pulse: 74  SpO2:  100%  Weight: 300 lb (136.1 kg)     Wt Readings from Last 3 Encounters:  09/04/17 300 lb (136.1 kg)  07/29/17 295 lb (133.8 kg)  07/27/17 297 lb 12.8 oz (135.1 kg)    PHYSICAL EXAM: General: Well appearing. No resp difficulty. HEENT: Normal Neck: Supple. JVP 6-7 cm, thick. Carotids 2+ bilat; no bruits. No thyromegaly or nodule noted. Cor: PMI nondisplaced. RRR, No M/G/R noted Lungs: CTAB, normal effort. Abdomen: Soft, non-tender, non-distended, no HSM. No bruits or masses. +BS  Extremities: No cyanosis, clubbing, or rash. R and LLE no  edema.  Neuro: Alert & orientedx3, cranial nerves grossly intact. moves all 4 extremities w/o difficulty. Affect pleasant   ASSESSMENT & PLAN:   1) Chronic systolic HF: NICM s/p BiV Medtronic, EF 25% 06/03/2016.   - NYHA II - Volume status stable on exam and optivol.  - Continue torsemide 40 mg BId with metolazone AS NEEDED.  - Continue entresto 49-51 twice a day. No room to increase today with soft BP. BMET today.  - Continue bisoprolol 5 mg daily.  - Continue digoxin 0.0625 mg every other day. - Continue hydralazine 37.5 mg TID - Continue imdur 30 mg BID.  - Reinforced fluid restriction to < 2 L daily, sodium restriction to less than 2000 mg daily, and the importance of daily weights.   2) CKD stage III - Followed by nephrology.  - BMET today.  3) HTN  - Stable on current meds as above.  4) OSA  - Continue nightly CPAP.  No change.  5) PAF/AFL-  S/P Succesful DC-CV 10/17/2015  (Initial episode 03/2014) - Recurrent A fib 10/2015 with successful DC/CV on 10/17/15.  - Had episode of AFL with ICD shock in 2/18 at time of influenza - Remains in NSR s/p DCCV 07/29/2017  - Now off amiodarone with thyrotoxicity. Evaluated by EP and for now will follow. If he has further AF will need to consider tikosyn or ablation. - Continue xarelto 20 mg daily. Denies bleeding.  6) Obesity:   - Encouraged to try and lose weight and increase activity.  7) Amiodarone  thyroxicity   - Off Amiodarone. Completed Methimazole treatment per PCP.   - No change.   Stable back in NSR. BMET today. RTC 2 months.   Graciella Freer, [name]  09/04/2017 11:03 AM   Greater than 50% of the 25 minute visit was spent in counseling/coordination of care regarding disease state education, salt/fluid restriction, sliding scale diuretics, and medication compliance.

## 2017-09-04 NOTE — Telephone Encounter (Signed)
Patient aware. Repeat labs 12/6 

## 2017-09-04 NOTE — Telephone Encounter (Signed)
-----   Message from Graciella Freer, [name] sent at 09/04/2017 12:34 PM EST ----- Creatinine elevated and BUN worse.   Please have hold torsemide tonight and all day tomorrow.  Make sure he is NOT taking metolazone.   Restart Sunday 40 mg q am and 20 mg q pm.   Needs repeat BMET next week.    [name] [id number] [place], [name] 09/04/2017 12:34 PM

## 2017-09-04 NOTE — Progress Notes (Signed)
Advanced Heart Failure Medication Review by a Pharmacist  Does the patient  feel that his/her medications are working for him/her?  yes  Has the patient been experiencing any side effects to the medications prescribed?  no  Does the patient measure his/her own blood pressure or blood glucose at home?  Not asked at this visit    Does the patient have any problems obtaining medications due to transportation or finances?   No - [employer] insurance   Understanding of regimen: good Understanding of indications: good Potential of compliance: good Patient understands to avoid NSAIDs. Patient understands to avoid decongestants.  Issues to address at subsequent visits: None   Pharmacist comments: Gary Hutchinson presents to clinic today without a medication list or bottles. He took most of his medications this morning except Imdur which he takes later on in the morning. He reports adherence to his medication regimen and has no questions or concerns at this time.     Time with patient: 10  Preparation and documentation time: 2 Total time: 12

## 2017-09-04 NOTE — Patient Instructions (Signed)
Routine lab work today. Will notify you of abnormal results, otherwise no news is good news!  No changes to medication at this time.  Follow up 2 months with Andy Tillery [name].  Take all medication as prescribed the day of your appointment. Bring all medications with you to your appointment.  Do the following things EVERYDAY: 1) Weigh yourself in the morning before breakfast. Write it down and keep it in a log. 2) Take your medicines as prescribed 3) Eat low salt foods-Limit salt (sodium) to 2000 mg per day.  4) Stay as active as you can everyday 5) Limit all fluids for the day to less than 2 liters  

## 2017-09-09 ENCOUNTER — Encounter (HOSPITAL_COMMUNITY): Payer: Self-pay | Admitting: *Deleted

## 2017-09-09 ENCOUNTER — Other Ambulatory Visit: Payer: Self-pay

## 2017-09-09 ENCOUNTER — Emergency Department (HOSPITAL_COMMUNITY): Payer: Medicare Other

## 2017-09-09 DIAGNOSIS — G4733 Obstructive sleep apnea (adult) (pediatric): Secondary | ICD-10-CM | POA: Diagnosis not present

## 2017-09-09 DIAGNOSIS — Z7901 Long term (current) use of anticoagulants: Secondary | ICD-10-CM | POA: Diagnosis not present

## 2017-09-09 DIAGNOSIS — E785 Hyperlipidemia, unspecified: Secondary | ICD-10-CM | POA: Insufficient documentation

## 2017-09-09 DIAGNOSIS — Z8249 Family history of ischemic heart disease and other diseases of the circulatory system: Secondary | ICD-10-CM | POA: Diagnosis not present

## 2017-09-09 DIAGNOSIS — I130 Hypertensive heart and chronic kidney disease with heart failure and stage 1 through stage 4 chronic kidney disease, or unspecified chronic kidney disease: Secondary | ICD-10-CM | POA: Diagnosis not present

## 2017-09-09 DIAGNOSIS — Z794 Long term (current) use of insulin: Secondary | ICD-10-CM | POA: Diagnosis not present

## 2017-09-09 DIAGNOSIS — Z95810 Presence of automatic (implantable) cardiac defibrillator: Secondary | ICD-10-CM | POA: Diagnosis not present

## 2017-09-09 DIAGNOSIS — E1122 Type 2 diabetes mellitus with diabetic chronic kidney disease: Secondary | ICD-10-CM | POA: Diagnosis not present

## 2017-09-09 DIAGNOSIS — I484 Atypical atrial flutter: Principal | ICD-10-CM | POA: Insufficient documentation

## 2017-09-09 DIAGNOSIS — I5022 Chronic systolic (congestive) heart failure: Secondary | ICD-10-CM | POA: Diagnosis not present

## 2017-09-09 DIAGNOSIS — E119 Type 2 diabetes mellitus without complications: Secondary | ICD-10-CM | POA: Insufficient documentation

## 2017-09-09 DIAGNOSIS — Z79899 Other long term (current) drug therapy: Secondary | ICD-10-CM | POA: Insufficient documentation

## 2017-09-09 DIAGNOSIS — I429 Cardiomyopathy, unspecified: Secondary | ICD-10-CM | POA: Insufficient documentation

## 2017-09-09 DIAGNOSIS — N189 Chronic kidney disease, unspecified: Secondary | ICD-10-CM | POA: Diagnosis not present

## 2017-09-09 DIAGNOSIS — Z7951 Long term (current) use of inhaled steroids: Secondary | ICD-10-CM | POA: Diagnosis not present

## 2017-09-09 DIAGNOSIS — Z6841 Body Mass Index (BMI) 40.0 and over, adult: Secondary | ICD-10-CM | POA: Diagnosis not present

## 2017-09-09 DIAGNOSIS — I4892 Unspecified atrial flutter: Secondary | ICD-10-CM | POA: Diagnosis present

## 2017-09-09 LAB — I-STAT TROPONIN, ED: Troponin i, poc: 0.02 ng/mL (ref 0.00–0.08)

## 2017-09-09 LAB — CBC
HCT: 35.7 % — ABNORMAL LOW (ref 39.0–52.0)
HEMOGLOBIN: 11.3 g/dL — AB (ref 13.0–17.0)
MCH: 28 pg (ref 26.0–34.0)
MCHC: 31.7 g/dL (ref 30.0–36.0)
MCV: 88.4 fL (ref 78.0–100.0)
Platelets: 186 10*3/uL (ref 150–400)
RBC: 4.04 MIL/uL — AB (ref 4.22–5.81)
RDW: 16.6 % — ABNORMAL HIGH (ref 11.5–15.5)
WBC: 9 10*3/uL (ref 4.0–10.5)

## 2017-09-09 LAB — BASIC METABOLIC PANEL
ANION GAP: 14 (ref 5–15)
BUN: 52 mg/dL — ABNORMAL HIGH (ref 6–20)
CHLORIDE: 102 mmol/L (ref 101–111)
CO2: 22 mmol/L (ref 22–32)
Calcium: 9.3 mg/dL (ref 8.9–10.3)
Creatinine, Ser: 1.77 mg/dL — ABNORMAL HIGH (ref 0.61–1.24)
GFR calc non Af Amer: 41 mL/min — ABNORMAL LOW (ref >60)
GFR, EST AFRICAN AMERICAN: 48 mL/min — AB (ref >60)
GLUCOSE: 195 mg/dL — AB (ref 65–99)
Potassium: 3.8 mmol/L (ref 3.5–5.1)
Sodium: 138 mmol/L (ref 135–145)

## 2017-09-09 NOTE — ED Notes (Signed)
Medtronic interrogator not working; will need to contact rep for interrogation once pt is in a room. Pt made aware

## 2017-09-09 NOTE — ED Triage Notes (Signed)
Pt was taking the trash out today and felt like he was shocked by ICD. Reports being shocked a year ago and pain felt similar. Denies chest pain, does have sob but felt may be associated with walking into ED. Scheduled to have kidney function checked in the morning. Hx of CHF, has not been taking lasix for the past few days due to kidney function

## 2017-09-10 ENCOUNTER — Encounter (HOSPITAL_COMMUNITY): Payer: Self-pay | Admitting: Emergency Medicine

## 2017-09-10 ENCOUNTER — Other Ambulatory Visit (HOSPITAL_COMMUNITY): Payer: Medicare Other

## 2017-09-10 ENCOUNTER — Observation Stay (HOSPITAL_COMMUNITY)
Admission: EM | Admit: 2017-09-10 | Discharge: 2017-09-10 | Disposition: A | Discharge status: Home / Self Care | Payer: Medicare Other | Attending: Cardiovascular Disease | Admitting: Cardiovascular Disease

## 2017-09-10 DIAGNOSIS — I472 Ventricular tachycardia, unspecified: Secondary | ICD-10-CM

## 2017-09-10 DIAGNOSIS — I481 Persistent atrial fibrillation: Secondary | ICD-10-CM | POA: Diagnosis not present

## 2017-09-10 DIAGNOSIS — I484 Atypical atrial flutter: Secondary | ICD-10-CM | POA: Diagnosis not present

## 2017-09-10 DIAGNOSIS — I519 Heart disease, unspecified: Secondary | ICD-10-CM | POA: Diagnosis not present

## 2017-09-10 DIAGNOSIS — T82198A Other mechanical complication of other cardiac electronic device, initial encounter: Secondary | ICD-10-CM

## 2017-09-10 DIAGNOSIS — I5022 Chronic systolic (congestive) heart failure: Secondary | ICD-10-CM | POA: Diagnosis not present

## 2017-09-10 DIAGNOSIS — I4892 Unspecified atrial flutter: Secondary | ICD-10-CM | POA: Diagnosis present

## 2017-09-10 LAB — HIV ANTIBODY (ROUTINE TESTING W REFLEX): HIV Screen 4th Generation wRfx: NONREACTIVE

## 2017-09-10 LAB — BRAIN NATRIURETIC PEPTIDE: B NATRIURETIC PEPTIDE 5: 186.7 pg/mL — AB (ref 0.0–100.0)

## 2017-09-10 MED ORDER — TORSEMIDE 20 MG PO TABS
40.0000 mg | ORAL_TABLET | Freq: Every day | ORAL | Status: DC
  Administered 2017-09-10: 40 mg via ORAL
  Filled 2017-09-10: qty 2

## 2017-09-10 MED ORDER — ISOSORBIDE MONONITRATE ER 30 MG PO TB24
30.0000 mg | ORAL_TABLET | Freq: Two times a day (BID) | ORAL | Status: DC
  Administered 2017-09-10: 30 mg via ORAL
  Filled 2017-09-10: qty 1

## 2017-09-10 MED ORDER — DIGOXIN 125 MCG PO TABS: 0.0625 mg | ORAL_TABLET | ORAL | Status: DC

## 2017-09-10 MED ORDER — INSULIN DETEMIR 100 UNIT/ML ~~LOC~~ SOLN: 36.0000 [IU] | Freq: Every day | SUBCUTANEOUS | Status: DC

## 2017-09-10 MED ORDER — ALLOPURINOL 300 MG PO TABS
300.0000 mg | ORAL_TABLET | Freq: Every day | ORAL | Status: DC
  Administered 2017-09-10: 300 mg via ORAL
  Filled 2017-09-10: qty 1

## 2017-09-10 MED ORDER — TORSEMIDE 20 MG PO TABS: 20.0000 mg | ORAL_TABLET | Freq: Every evening | ORAL | Status: DC

## 2017-09-10 MED ORDER — MOMETASONE FURO-FORMOTEROL FUM 200-5 MCG/ACT IN AERO
2.0000 | INHALATION_SPRAY | Freq: Two times a day (BID) | RESPIRATORY_TRACT | Status: DC
  Filled 2017-09-10: qty 8.8

## 2017-09-10 MED ORDER — ONDANSETRON HCL 4 MG/2ML IJ SOLN: 4.0000 mg | Freq: Four times a day (QID) | INTRAMUSCULAR | Status: DC | PRN

## 2017-09-10 MED ORDER — RIVAROXABAN 20 MG PO TABS
20.0000 mg | ORAL_TABLET | Freq: Every day | ORAL | Status: DC
  Filled 2017-09-10: qty 1

## 2017-09-10 MED ORDER — HYDRALAZINE HCL 25 MG PO TABS
37.5000 mg | ORAL_TABLET | Freq: Three times a day (TID) | ORAL | Status: DC
  Administered 2017-09-10: 37.5 mg via ORAL
  Filled 2017-09-10: qty 2

## 2017-09-10 MED ORDER — SACUBITRIL-VALSARTAN 49-51 MG PO TABS
1.0000 | ORAL_TABLET | Freq: Two times a day (BID) | ORAL | Status: DC
  Administered 2017-09-10: 1 via ORAL
  Filled 2017-09-10: qty 1

## 2017-09-10 MED ORDER — SOTALOL HCL 80 MG PO TABS: 80.0000 mg | ORAL_TABLET | Freq: Two times a day (BID) | ORAL | 6 refills | Status: DC

## 2017-09-10 MED ORDER — SPIRONOLACTONE 12.5 MG HALF TABLET
12.5000 mg | ORAL_TABLET | Freq: Every day | ORAL | Status: DC
  Administered 2017-09-10: 12.5 mg via ORAL
  Filled 2017-09-10: qty 1

## 2017-09-10 MED ORDER — ACETAMINOPHEN 325 MG PO TABS: 650.0000 mg | ORAL_TABLET | ORAL | Status: DC | PRN

## 2017-09-10 MED ORDER — ATORVASTATIN CALCIUM 40 MG PO TABS
40.0000 mg | ORAL_TABLET | Freq: Every day | ORAL | Status: DC
  Administered 2017-09-10: 40 mg via ORAL
  Filled 2017-09-10: qty 1

## 2017-09-10 MED ORDER — BISOPROLOL FUMARATE 5 MG PO TABS
5.0000 mg | ORAL_TABLET | Freq: Every day | ORAL | Status: DC
  Administered 2017-09-10: 5 mg via ORAL
  Filled 2017-09-10: qty 1

## 2017-09-10 MED ORDER — ALBUTEROL SULFATE (2.5 MG/3ML) 0.083% IN NEBU: 3.0000 mL | INHALATION_SOLUTION | Freq: Four times a day (QID) | RESPIRATORY_TRACT | Status: DC | PRN

## 2017-09-10 NOTE — Discharge Summary (Signed)
ELECTROPHYSIOLOGY PROCEDURE DISCHARGE SUMMARY    Patient ID: Gary Hutchinson,  MRN: [id number], DOB/AGE: January 05, 1961 56 y.o.  Admit date: 09/10/2017 Discharge date: 09/10/2017  Primary Care Physician: Creola Corn, MD Primary Cardiologist: Dr. Gala Romney Electrophysiologist: Dr. [name]  Primary Discharge Diagnosis:  1. ICD shock 2. Persistent AFlutter     CHA2DS2Vasc is 3, on Xarelto  Secondary Discharge Diagnosis:  1. NICM 2. Chronic CHF (compensated currently)   No Known Allergies   Procedures This Admission:  None  Brief HPI: Gary Hutchinson is a 56 y.o. 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   Hospital Course:  The patient reported  has been feeling very well of late, walking getting 02-6999 steps most days in with good exertional capacity, saw Dr. Gala Romney in October, was planned for DCCV back in his AFlutter though arrived to procedure in SR.  He denies any kind of CP, no DOE out of his baseline, had labs done a week ago with elevated Creat and his diuretics were adjusted though he only did this for 2 days with some swelling, resumed his usual dosing.  He reports compliance with his medicines, no missed doses of anything, and no other changes of any kind.  He stated yesterday being a good day, had been out all day, shopping, running errands, groceries, carrying bags without trouble, at the end of the day did start to feel like he had maybe overdone it feeling somewhat suddenly tired.  He decided to go ahead and take the garbage cans out to the road and call it a day.  He rolled his cans to the curb, mentioning he has a long drivway when he got shocked.  He did not feel near syncopal or have warning, did not faint, no particular symptoms afterwards either, other then discomfort/anxiety from shock.  He feels he has probobaly  just over done it the last couple days.  Labs were unrevealing.  Device interrogation: Battery and lead measurements are good Yesterday started to have AF around 5pm 1 shock and 1 ATP, 2 events deferred with wavelet descriminator Both for 1:1 tachycardia, cycle length 270-259ms   EP was consulted to his case, after review of his device interrogation, EKG's, noted he was indeed shocked for his AFlutter with RVR, 1:1 conduction, and discussion with the patient, Dr. [name] felt revisiting AAD therapy is warranted and decided Sotalol 80mg  BID will be started.  Given ICD in place, OK to start outpatient, AFib clinic follow up for Monday visit/EKG has been arranged.  The patient has been instructed to not drive until cleared.  We will discharge today on same home meds with the addition of sotalol at 80mg  BID dosing schedule.  The patient was examined by Dr. [name] and considered stable for discharge to home.    Physical Exam: Vitals:   09/10/17 1030 09/10/17 1214 09/10/17 1246 09/10/17 1300  BP: 100/64  (!) 87/69 (!) 96/57  Pulse: (!) 55 75 80 (!) 59  Resp: 15   16  Temp:      TempSrc:      SpO2: 97% 97% 97% 98%     Labs:   Lab Results  Component Value Date   WBC 9.0 09/09/2017   HGB 11.3 (L) 09/09/2017   HCT 35.7 (L) 09/09/2017   MCV 88.4 09/09/2017   PLT 186 09/09/2017  Recent Labs  Lab 09/09/17 1959  NA 138  K 3.8  CL 102  CO2 22  BUN 52*  CREATININE 1.77*  CALCIUM 9.3  GLUCOSE 195*    Discharge Medications:  Allergies as of 09/10/2017   No Known Allergies     Medication List    TAKE these medications   acetaminophen 325 MG tablet Commonly known as:  TYLENOL Take 325-650 mg by mouth every 6 (six) hours as needed (for pain). Reported on 10/11/2015   ADVAIR DISKUS 250-50 MCG/DOSE Aepb Generic drug:  Fluticasone-Salmeterol Inhale 1 puff into the lungs every 12 (twelve) hours as needed (for flares/shortness of breath). Reported on 02/06/2016   allopurinol 300  MG tablet Commonly known as:  ZYLOPRIM Take 300 mg by mouth daily.   atorvastatin 40 MG tablet Commonly known as:  LIPITOR TAKE 1 TABLET (40 MG TOTAL) BY MOUTH DAILY.   bisoprolol 5 MG tablet Commonly known as:  ZEBETA Take 1 tablet (5 mg total) by mouth daily.   colchicine 0.6 MG tablet Take 0.6 mg by mouth daily as needed (for gout flares). Reported on 02/06/2016   digoxin 0.125 MG tablet Commonly known as:  LANOXIN Take 0.5 tablets (0.0625 mg total) by mouth every other day.   hydrALAZINE 25 MG tablet Commonly known as:  APRESOLINE Take 1.5 tablets (37.5 mg total) by mouth 3 (three) times daily.   insulin detemir 100 UNIT/ML injection Commonly known as:  LEVEMIR Inject 36 Units into the skin at bedtime.   isosorbide mononitrate 30 MG 24 hr tablet Commonly known as:  IMDUR TAKE 1 TABLET (30 MG TOTAL) BY MOUTH 2 TIMES DAILY. What changed:    how much to take  how to take this  when to take this  additional instructions   linagliptin 5 MG Tabs tablet Commonly known as:  TRADJENTA Take 1 tablet (5 mg total) by mouth daily.   metolazone 2.5 MG tablet Commonly known as:  ZAROXOLYN Take 2.5 mg by mouth daily as needed (for edema). Reported on 02/06/2016   montelukast 10 MG tablet Commonly known as:  SINGULAIR Take 10 mg by mouth daily as needed (for shortness of breath, wheezing, or flares).   PROAIR HFA 108 (90 Base) MCG/ACT inhaler Generic drug:  albuterol Inhale 2 puffs into the lungs every 6 (six) hours as needed for wheezing or shortness of breath. Reported on 02/06/2016   rivaroxaban 20 MG Tabs tablet Commonly known as:  XARELTO Take 1 tablet (20 mg total) by mouth daily with supper.   sacubitril-valsartan 49-51 MG Commonly known as:  ENTRESTO Take 1 tablet by mouth 2 (two) times daily.   sotalol 80 MG tablet Commonly known as:  BETAPACE Take 1 tablet (80 mg total) by mouth 2 (two) times daily.   spironolactone 25 MG tablet Commonly known as:   ALDACTONE Take 12.5 mg by mouth daily.   torsemide 20 MG tablet Commonly known as:  DEMADEX Take 20-40 mg by mouth See admin instructions. 40 mg in the morning and 20 mg in the evening       Disposition: Home Discharge Instructions    Diet - low sodium heart healthy   Complete by:  As directed    Increase activity slowly   Complete by:  As directed      Follow-up Information    [facility] Follow up on 09/14/2017.   Specialty:  Cardiology Why:  2:30PM Contact information: [address] [id number] [place] [id number] [phone]  Duration of Discharge Encounter: Greater than 30 minutes including physician time.  SignedFrancis Dowse, [name] 09/10/2017 1:57 PM   Agree with above. See my consult note for further details.  Hillis Range MD, [facility]

## 2017-09-10 NOTE — Progress Notes (Signed)
Cardiology History & Physical    Patient ID: Gary Hutchinson MRN: [id number], DOB: Apr 23, 1961 Date of Encounter: 09/10/2017, 5:50 AM Primary Physician: Creola Corn, MD  Chief Complaint: ICD shock   HPI: Gary Hutchinson is a 56 y.o. male with history of ischemic cardiomyopathy with an EF of 30%, atrial fibrillation and atrial flutter, VT, amiodarone induced thyrotoxicosis, CKD, type 2 diabetes, who presents following an ICD shock.  Patient has been followed in the office and was recently known to revert back into atrial flutter in October 6.  Cardioversion was planned, however he converted to normal sinus rhythm on his own so this was necessary.  Patient has been doing relatively well since then.  He has been walking 03-6999 steps a day without too much difficulty.  His volume status has been under reasonably good control.  Recent follow-up labs with the advanced heart failure team showed a creatinine of 2.15 from a recent baseline of 1.6.  He was instructed to reduce his home torsemide dosing from 40 mg in the a.m. and 20 mg in the p.m. to 20 mg twice daily.  He did this for a couple of days and then restarted his 40/20 regimen.  Today he was feeling relatively well and was out rolling his trash cans to the edge of the driveway when he felt the relatively sudden onset of dyspnea and profound fatigue, which was quickly followed by an ICD shock.  He presented to the emergency department for further evaluation.  In the emergency department, his heart rate has been variable ranging from the 50s up to the 120s.  The rest of his vital signs are within normal limits.  Initial ECG appears to be atrial flutter with a ventricular rate of 118.  Initial labs were unremarkable.  His device was interrogated by the [employer] representative.  Upon my review of the interrogation, the tachycardia was regular with a cycle length of 280-290 ms.  There was 1-1 AV conduction.  He received an ICD shock which terminated the  arrhythmia, as the initial rate was within the VF zone.  About an hour later, he had another episode of the same tachycardia which was terminated by ATP.  Patient was then admitted to the [facility] cardiology service for further management.  Past Medical History:  Diagnosis Date  . AICD (automatic cardioverter/defibrillator) present    [employer]  . Alcohol abuse    now quit  . Anemia    iron defi  . Arthritis    "fingers, knees, some days all my joints ache" (11/16/2015)  . Asthma   . Cholecystitis   . Chronic systolic congestive heart failure (HCC)    a. RHC (02/2011) RA 31/27, RV 71/27, PA67/45, PCWP 46, PA 49%, Fick CO: 3.4 b. ECHO (03/2014) EF 30-35%, grade II DD, mild MR, RV poorly visualized appears mildly decreased c. RHC (05/2014) RA 13, RV 54/4/11, PA 60/22 (37), PCWP 17, Fick CO/CI: 4.4 / 1.9, Thermo CO/CI: 3.8 /1.6, PVR 5.2 WU, PA 57% and 59%  . Diverticulosis of colon   . Gout   . Hemorrhoids, internal   . Hyperlipidemia   . Hypertension   . Morbid obesity (HCC)   . Nonischemic cardiomyopathy (HCC)    a. LHC (02/2011) normal coronaries  . OSA on CPAP   . Paroxysmal atrial fibrillation (HCC)   . Pneumonia 2000s X 1  . Pulmonary hypertension (HCC)   . Renal insufficiency   . Type II diabetes mellitus (HCC)   .  Ventricular tachycardia [facility](HCC)    s/p MDT ICD implant     Surgical History:  Past Surgical History:  Procedure Laterality Date  . CARDIAC CATHETERIZATION  03/08/2004   EF 25-30%  . CARDIOVERSION N/A 10/17/2015   Procedure: CARDIOVERSION;  Surgeon: Vesta MixerPhilip J Nahser, MD;  [facility];  Service: Cardiovascular;  Laterality: N/A;  . EP IMPLANTABLE DEVICE N/A 11/16/2015   Procedure: BiVI Upgrade;  Surgeon: Hillis RangeJames Allred, MD;  [employer] [address] [place]  . INSERTION OF ICD  06/2008   ICD- [employer]   . RIGHT HEART CATHETERIZATION N/A 05/23/2014   Procedure: RIGHT HEART CATH;  Surgeon: Dolores Pattyaniel R [name], MD;  [facility];  Service: Cardiovascular;   Laterality: N/A;  . TRANSTHORACIC ECHOCARDIOGRAM  05/27/2008   EF 30-35%  . US ECHOCARDIOGRAPHY  08/22/2008   EF 30-35%     Home Meds: Prior to Admission medications   Medication Sig Start Date End Date Taking? Authorizing Provider  acetaminophen (TYLENOL) 325 MG tablet Take 325-650 mg by mouth every 6 (six) hours as needed (for pain). Reported on 10/11/2015   Yes [provider]  albuterol (PROAIR HFA) 108 (90 BASE) MCG/ACT inhaler Inhale 2 puffs into the lungs every 6 (six) hours as needed for wheezing or shortness of breath. Reported on 02/06/2016   Yes [provider]  allopurinol (ZYLOPRIM) 300 MG tablet Take 300 mg by mouth daily.   Yes [provider]  atorvastatin (LIPITOR) 40 MG tablet TAKE 1 TABLET (40 MG TOTAL) BY MOUTH DAILY. 05/04/17  Yes [name], Bevelyn Bucklesaniel R, MD  bisoprolol (ZEBETA) 5 MG tablet Take 1 tablet (5 mg total) by mouth daily. 02/16/17  Yes [name], Bevelyn Bucklesaniel R, MD  colchicine 0.6 MG tablet Take 0.6 mg by mouth daily as needed (for gout flares). Reported on 02/06/2016   Yes [provider]  digoxin (LANOXIN) 0.125 MG tablet Take 0.5 tablets (0.0625 mg total) by mouth every other day. 01/24/16  Yes [name], Bevelyn Bucklesaniel R, MD  Fluticasone-Salmeterol (ADVAIR DISKUS) 250-50 MCG/DOSE AEPB Inhale 1 puff into the lungs every 12 (twelve) hours as needed (for flares/shortness of breath). Reported on 02/06/2016   Yes [provider]  hydrALAZINE (APRESOLINE) 25 MG tablet Take 1.5 tablets (37.5 mg total) by mouth 3 (three) times daily. 04/13/17 07/27/18 Yes Tillery, Mariam DollarMichael Andrew, [name]  insulin detemir (LEVEMIR) 100 UNIT/ML injection Inject 36 Units into the skin at bedtime.  03/13/14  Yes Ulla Potashosgrove, Ali B, NP  isosorbide mononitrate (IMDUR) 30 MG 24 hr tablet TAKE 1 TABLET (30 MG TOTAL) BY MOUTH 2 TIMES DAILY. Patient taking differently: Take 30 mg by mouth 2 (two) times daily.  08/17/17  Yes [name], Bevelyn Bucklesaniel R, MD  linagliptin (TRADJENTA) 5 MG TABS tablet  Take 1 tablet (5 mg total) by mouth daily. 03/13/14  Yes Aundria Rudosgrove, Ali B, NP  metolazone (ZAROXOLYN) 2.5 MG tablet Take 2.5 mg by mouth daily as needed (for edema). Reported on 02/06/2016   Yes [provider]  montelukast (SINGULAIR) 10 MG tablet Take 10 mg by mouth daily as needed (for shortness of breath, wheezing, or flares).    Yes [provider]  rivaroxaban (XARELTO) 20 MG TABS tablet Take 1 tablet (20 mg total) by mouth daily with supper. 12/29/16  Yes [name], Bevelyn Bucklesaniel R, MD  sacubitril-valsartan (ENTRESTO) 49-51 MG Take 1 tablet by mouth 2 (two) times daily. 12/29/16  Yes [name], Bevelyn Bucklesaniel R, MD  spironolactone (ALDACTONE) 25 MG tablet Take 12.5 mg by mouth daily.  10/28/15  Yes [provider]  torsemide (DEMADEX) 20 MG tablet Take 20-40 mg by mouth See admin instructions. 40 mg in the morning and 20 mg in the evening   Yes [provider]    Allergies: No Known Allergies  Social History   Socioeconomic History  . Marital status: Single    Spouse name: Not on file  . Number of children: 4  . Years of education: Not on file  . Highest education level: Not on file  Social Needs  . Financial resource strain: Not on file  . Food insecurity - worry: Not on file  . Food insecurity - inability: Not on file  . Transportation needs - medical: Not on file  . Transportation needs - non-medical: Not on file  Occupational History    Employer: DISABLED  Tobacco Use  . Smoking status: Never Smoker  . Smokeless tobacco: Never Used  Substance and Sexual Activity  . Alcohol use: Yes    Comment: former heavy ETOH, quit in "the late '90s"  . Drug use: No  . Sexual activity: Not Currently  Other Topics Concern  . Not on file  Social History Narrative   disabled     Family History  Problem Relation Age of Onset  . Cancer Father   . Diabetes Unknown        grandparents  . Hypertension Mother   . CAD Unknown        No family history    Review of  Systems: All other systems reviewed and are otherwise negative except as noted above.  Labs:   Lab Results  Component Value Date   WBC 9.0 09/09/2017   HGB 11.3 (L) 09/09/2017   HCT 35.7 (L) 09/09/2017   MCV 88.4 09/09/2017   PLT 186 09/09/2017    Recent Labs  Lab 09/09/17 1959  NA 138  K 3.8  CL 102  CO2 22  BUN 52*  CREATININE 1.77*  CALCIUM 9.3  GLUCOSE 195*   No results for input(s): CKTOTAL, CKMB, TROPONINI in the last 72 hours. Lab Results  Component Value Date   CHOL 181 04/09/2011   HDL 37 (L) 04/09/2011   LDLCALC 117 (H) 04/09/2011   TRIG 137 04/09/2011   Lab Results  Component Value Date   DDIMER 0.67 (H) 04/06/2011    Radiology/Studies:  Dg Chest 2 View  Result Date: 09/09/2017 CLINICAL DATA:  ICD activation. EXAM: CHEST  2 VIEW COMPARISON:  11/25/2016 FINDINGS: ICD device with stable positioning of the leads. Enlarged cardiac silhouette. There is no evidence of focal airspace consolidation, pleural effusion or pneumothorax. Mild pulmonary vascular congestion. Osseous structures are without acute abnormality. Soft tissues are grossly normal. IMPRESSION: Enlarged cardiac silhouette with mild pulmonary vascular congestion. Electronically Signed   By: Ted Mcalpine M.D.   On: 09/09/2017 20:57   Wt Readings from Last 3 Encounters:  09/04/17 136.1 kg (300 lb)  07/29/17 133.8 kg (295 lb)  07/27/17 135.1 kg (297 lb 12.8 oz)    EKG: Atrial flutter with 2-1 conduction, biventricular pacing.  Physical Exam: Blood pressure 122/82, pulse 86, temperature 98.2 F (36.8 C), temperature source Oral, resp. rate (!) 23, SpO2 99 %. There is no height or weight on file to calculate BMI. General: Well developed, well nourished, in no acute distress. Head: Normocephalic, atraumatic, sclera non-icteric, no xanthomas, nares are without discharge.  Neck: Negative for carotid bruits. JVD does not appear to be elevated. Lungs: Clear bilaterally to auscultation without  wheezes, rales, or rhonchi. Breathing  is unlabored. Heart: Tachycardic, regular with diminished S1 S2. No murmurs, rubs, or gallops appreciated. Abdomen: Soft, non-tender, non-distended with normoactive bowel sounds. No hepatomegaly. No rebound/guarding. No obvious abdominal masses. Msk:  Strength and tone appear normal for age. Extremities: No clubbing or cyanosis.  Trace lower extremity edema.  Distal pedal pulses are 2+ and equal bilaterally. Neuro: Alert and oriented X 3. No focal deficit. No facial asymmetry. Moves all extremities spontaneously. Psych:  Responds to questions appropriately with a normal affect.    Assessment and Plan  56 year old male with ischemic cardiomyopathy, paroxysmal atrial fibrillation atrial flutter, who presents following an ICD shock, for what appears to be atrial flutter with one-to-one conduction.  1.  ICD shock: As aforementioned, given prior history of atrial flutter, presuming that this episode of tachycardia was atrial flutter with rapid conduction.  The tachycardia cycle length was approximately 280 ms.  Atrial tachycardia could also be in the differential.  Unfortunately, the patient has a history of amiodarone-induced thyrotoxicosis, making choice of antiarrhythmic difficult.  There has been talk of about dofetilide loading in the past.  Continuing home Xarelto for anticoagulation.  Plan for EP consult today.  2.  Chronic systolic heart failure: Appears close to euvolemic on exam today.  Has been taking reduced dose torsemide given mild mildly increased creatinine on recent lab check.  Creatinine back down to 1.77 today.  Resume usual home torsemide regimen of 40 mg in the a.m. and 20 mg in the p.m.  Otherwise, we will continue all of the patient's usual goal-directed medical therapy, including carvedilol, Sherryll Burger, M Doerr, and hydralazine.  Reasonable to continue digoxin for now.  3.  CKD: Creatinine improved back to 1.77 from recent level of 2.15.   Resuming usual torsemide dosing as after mentioned.  4.  Diabetes: Continue home insulin regimen.  Signed, Esmond Plants, MD 09/10/2017, 5:50 AM

## 2017-09-10 NOTE — ED Notes (Signed)
Pt to be admitted. Will continue to monitor

## 2017-09-10 NOTE — ED Notes (Signed)
Per [place], [place], will return to speak with pt

## 2017-09-10 NOTE — ED Notes (Signed)
[employer] at bedside.

## 2017-09-10 NOTE — ED Provider Notes (Signed)
[facility] Provider Note   CSN: [id number] Arrival date & time: 09/09/17  1947     History   Chief Complaint Chief Complaint  Patient presents with  . AICD Problem    HPI Gary FishermanJulius A Hutchinson is a 56 y.o. male with history of chronic systolic heart failure, ICM status post ICD, hypertension, diabetes, sleep apnea, ventricular tachycardia status post AICD in 2009, atrial fibrillation, obesity presents to ED for evaluation of possible AICD shock earlier today. States he was taking out his trash when he felt a sharp shock on the left upper anterior shoulder one time. None since. Denies preceding lightheadedness, chest pain, shortness of breath, nausea, vomiting, diaphoresis, palpitations. States he has been feeling more tired and more winded on exertion the last 2-3 days, but no CP. This is not typical for him.He feels like he might have extra fluid on him. He went to his doctor 1 week ago and was told that his kidney function was elevated and has decreased his Lasix dose since. Denies orthopnea, PND, lower extremity edema.Thinks he has gained 2 pounds in the last couple of weeks from his baseline.  HPI  Past Medical History:  Diagnosis Date  . AICD (automatic cardioverter/defibrillator) present    Medtronic  . Alcohol abuse    now quit  . Anemia    iron defi  . Arthritis    "fingers, knees, some days all my joints ache" (11/16/2015)  . Asthma   . Cholecystitis   . Chronic systolic congestive heart failure (HCC)    a. RHC (02/2011) RA 31/27, RV 71/27, PA67/45, PCWP 46, PA 49%, Fick CO: 3.4 b. ECHO (03/2014) EF 30-35%, grade II DD, mild MR, RV poorly visualized appears mildly decreased c. RHC (05/2014) RA 13, RV 54/4/11, PA 60/22 (37), PCWP 17, Fick CO/CI: 4.4 / 1.9, Thermo CO/CI: 3.8 /1.6, PVR 5.2 WU, PA 57% and 59%  . Diverticulosis of colon   . Gout   . Hemorrhoids, internal   . Hyperlipidemia   . Hypertension   . Morbid obesity (HCC)   .  Nonischemic cardiomyopathy (HCC)    a. LHC (02/2011) normal coronaries  . OSA on CPAP   . Paroxysmal atrial fibrillation (HCC)   . Pneumonia 2000s X 1  . Pulmonary hypertension (HCC)   . Renal insufficiency   . Type II diabetes mellitus (HCC)   . Ventricular tachycardia [facility](HCC)    s/p MDT ICD implant    Patient Active Problem List   Diagnosis Date Noted  . Sepsis (HCC) 11/25/2016  . 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 CHF (congestive heart failure) (HCC) 11/16/2015  . Chronic systolic heart failure (HCC) 10/25/2015  . Atrial flutter (HCC)   . LBBB (left bundle branch block) 10/06/2015  . Cardiogenic shock (HCC) 03/08/2014  . Atrial fibrillation (HCC) 03/06/2014  . Ventricular tachycardia (HCC)   . Automatic implantable cardioverter-defibrillator in situ 12/06/2009  . Pulmonary HTN (HCC) 05/02/2008  . HEMORRHOIDS, INTERNAL 05/02/2008  . DIVERTICULOSIS OF COLON 05/02/2008  . ALCOHOL ABUSE, HX OF 05/02/2008  . CHOLECYSTITIS, UNSPECIFIED 04/10/2008  . Insulin dependent type 2 diabetes mellitus, uncontrolled (HCC) 05/07/2007  . GOUT 05/07/2007  . Morbid obesity (HCC) 05/07/2007  . ANEMIA-IRON DEFICIENCY 05/07/2007  . Obstructive sleep apnea 05/07/2007  . HTN (hypertension) 05/07/2007    Past Surgical History:  Procedure Laterality Date  . CARDIAC CATHETERIZATION  03/08/2004   EF 25-30%  . CARDIOVERSION N/A 10/17/2015   Procedure: CARDIOVERSION;  Surgeon: Vesta MixerPhilip J Nahser, MD;  Location: [facility] ENDOSCOPY;  Service: Cardiovascular;  Laterality: N/A;  . EP IMPLANTABLE DEVICE N/A 11/16/2015   Procedure: BiVI Upgrade;  Surgeon: Hillis Range, MD;  [employer]tr[employer] [address] [place]  . INSERTION OF ICD  06/2008   ICD- Medtronic   . RIGHT HEART CATHETERIZATION N/A 05/23/2014   Procedure: RIGHT HEART CATH;  Surgeon: [name], MD;  Location: [facility] CATH LAB;  Service: Cardiovascular;  Laterality: N/A;  . TRANSTHORACIC ECHOCARDIOGRAM  05/27/2008   EF 30-35%  . US ECHOCARDIOGRAPHY   08/22/2008   EF 30-35%       Home Medications    Prior to Admission medications   Medication Sig Start Date End Date Taking? Authorizing Provider  acetaminophen (TYLENOL) 325 MG tablet Take 325-650 mg by mouth every 6 (six) hours as needed (for pain). Reported on 10/11/2015   Yes [provider]  albuterol (PROAIR HFA) 108 (90 BASE) MCG/ACT inhaler Inhale 2 puffs into the lungs every 6 (six) hours as needed for wheezing or shortness of breath. Reported on 02/06/2016   Yes [provider]  allopurinol (ZYLOPRIM) 300 MG tablet Take 300 mg by mouth daily.   Yes [provider]  atorvastatin (LIPITOR) 40 MG tablet TAKE 1 TABLET (40 MG TOTAL) BY MOUTH DAILY. 05/04/17  Yes [name], Bevelyn Buckles, MD  bisoprolol (ZEBETA) 5 MG tablet Take 1 tablet (5 mg total) by mouth daily. 02/16/17  Yes [name], Bevelyn Buckles, MD  colchicine 0.6 MG tablet Take 0.6 mg by mouth daily as needed (for gout flares). Reported on 02/06/2016   Yes [provider]  digoxin (LANOXIN) 0.125 MG tablet Take 0.5 tablets (0.0625 mg total) by mouth every other day. 01/24/16  Yes [name], Bevelyn Buckles, MD  Fluticasone-Salmeterol (ADVAIR DISKUS) 250-50 MCG/DOSE AEPB Inhale 1 puff into the lungs every 12 (twelve) hours as needed (for flares/shortness of breath). Reported on 02/06/2016   Yes [provider]  hydrALAZINE (APRESOLINE) 25 MG tablet Take 1.5 tablets (37.5 mg total) by mouth 3 (three) times daily. 04/13/17 07/27/18 Yes Tillery, Mariam Dollar, [name]  insulin detemir (LEVEMIR) 100 UNIT/ML injection Inject 36 Units into the skin at bedtime.  03/13/14  Yes Ulla Potash B, NP  isosorbide mononitrate (IMDUR) 30 MG 24 hr tablet TAKE 1 TABLET (30 MG TOTAL) BY MOUTH 2 TIMES DAILY. Patient taking differently: Take 30 mg by mouth 2 (two) times daily.  08/17/17  Yes [name], Bevelyn Buckles, MD  linagliptin (TRADJENTA) 5 MG TABS tablet Take 1 tablet (5 mg total) by mouth daily. 03/13/14  Yes Aundria Rud, NP    metolazone (ZAROXOLYN) 2.5 MG tablet Take 2.5 mg by mouth daily as needed (for edema). Reported on 02/06/2016   Yes [provider]  montelukast (SINGULAIR) 10 MG tablet Take 10 mg by mouth daily as needed (for shortness of breath, wheezing, or flares).    Yes [provider]  rivaroxaban (XARELTO) 20 MG TABS tablet Take 1 tablet (20 mg total) by mouth daily with supper. 12/29/16  Yes [name], Bevelyn Buckles, MD  sacubitril-valsartan (ENTRESTO) 49-51 MG Take 1 tablet by mouth 2 (two) times daily. 12/29/16  Yes [name], Bevelyn Buckles, MD  spironolactone (ALDACTONE) 25 MG tablet Take 12.5 mg by mouth daily.  10/28/15  Yes [provider]  torsemide (DEMADEX) 20 MG tablet Take 20-40 mg by mouth See admin instructions. 40 mg in the morning and 20 mg in the evening   Yes [provider]    Family History Family History  Problem Relation Age  of Onset  . Cancer Father   . Diabetes Unknown        grandparents  . Hypertension Mother   . CAD Unknown        No family history    Social History Social History   Tobacco Use  . Smoking status: Never Smoker  . Smokeless tobacco: Never Used  Substance Use Topics  . Alcohol use: Yes    Comment: former heavy ETOH, quit in "the late '90s"  . Drug use: No     Allergies   Patient has no known allergies.   Review of Systems Review of Systems  Constitutional: Positive for fatigue.  Respiratory: Positive for shortness of breath.      Physical Exam Updated Vital Signs BP 125/64   Pulse 60   Temp 98.2 F (36.8 C) (Oral) Comment: 98.2  Resp (!) 24   SpO2 97%   Physical Exam  Constitutional: He is oriented to person, place, and time. He appears well-developed and well-nourished. No distress.  NAD. Obese male.   HENT:  Head: Normocephalic and atraumatic.  Right Ear: External ear normal.  Left Ear: External ear normal.  Nose: Nose normal.  Moist mucous membranes   Eyes: Conjunctivae and EOM are normal. No  scleral icterus.  Neck: Normal range of motion. Neck supple.  Cardiovascular: Normal rate, regular rhythm, normal heart sounds and intact distal pulses.  No murmur heard. RRR. 2+ DP and radial pulses bilaterally. 1+ pitting edema bilaterally. Calves non tender and symmetric. No orthopnea.   Pulmonary/Chest: Effort normal and breath sounds normal. He has no wheezes.  Decreased breath sounds in lower lobes posteriorly. No crackles.   Abdominal: Soft. There is no tenderness.  Obese abdomen, no obvious distention. NT. No G/R/R.   Musculoskeletal: Normal range of motion. He exhibits no deformity.  Neurological: He is alert and oriented to person, place, and time.  Skin: Skin is warm and dry. Capillary refill takes less than 2 seconds.  Psychiatric: He has a normal mood and affect. His behavior is normal. Judgment and thought content normal.  Nursing note and vitals reviewed.    ED Treatments / Results  Labs (all labs ordered are listed, but only abnormal results are displayed) Labs Reviewed  BASIC METABOLIC PANEL - Abnormal; Notable for the following components:      Result Value   Glucose, Bld 195 (*)    BUN 52 (*)    Creatinine, Ser 1.77 (*)    GFR calc non Af Amer 41 (*)    GFR calc Af Amer 48 (*)    All other components within normal limits  CBC - Abnormal; Notable for the following components:   RBC 4.04 (*)    Hemoglobin 11.3 (*)    HCT 35.7 (*)    RDW 16.6 (*)    All other components within normal limits  BRAIN NATRIURETIC PEPTIDE - Abnormal; Notable for the following components:   B Natriuretic Peptide 186.7 (*)    All other components within normal limits  I-STAT TROPONIN, ED    EKG  EKG Interpretation  Date/Time:  Wednesday September 09 2017 19:56:03 EST Ventricular Rate:  118 PR Interval:    QRS Duration: 118 QT Interval:  350 QTC Calculation: 490 R Axis:   -80 Text Interpretation:  Ventricular-paced rhythm Biventricular pacemaker detected Abnormal ECG  Confirmed by Gilda Crease ([id number]) on 09/10/2017 12:44:23 AM       Radiology Dg Chest 2 View  Result Date: 09/09/2017 CLINICAL DATA:  ICD activation. EXAM: CHEST  2 VIEW COMPARISON:  11/25/2016 FINDINGS: ICD device with stable positioning of the leads. Enlarged cardiac silhouette. There is no evidence of focal airspace consolidation, pleural effusion or pneumothorax. Mild pulmonary vascular congestion. Osseous structures are without acute abnormality. Soft tissues are grossly normal. IMPRESSION: Enlarged cardiac silhouette with mild pulmonary vascular congestion. Electronically Signed   By: Ted Mcalpine M.D.   On: 09/09/2017 20:57    Procedures Procedures (including critical care time)  Medications Ordered in ED Medications - No data to display   Initial Impression / Assessment and Plan / ED Course  I have reviewed the triage vital signs and the nursing notes.  Pertinent labs & imaging results that were available during my care of the patient were reviewed by me and considered in my medical decision making (see chart for details).  Clinical Course as of Sep 10 522  Thu Sep 10, 2017  0119 Hemoglobin: (!) 11.3 [CG]  0119 Creatinine: (!) 1.77 [CG]  0119 GFR, Est African American: (!) 48 [CG]  0120 Pulse Rate: (!) 108 [CG]  0330 B Natriuretic Peptide: (!) 186.7 [CG]    Clinical Course User Index [CG] Liberty Handy, [name]   56 year old male with history of chronic systolic heart failure , paroxysmal atrial fibrillation, ventricular tachycardia status post AICD presents for evaluation of shock that he felt yesterday evening. Reports generalized fatigue and exertional shortness of breath for the last 2-3 days. Was told to decrease Lasix dose for the last week due to elevated creatinine.  HR 60. BP WNL. 1+ pitting edema. No crackles but decreased lung sounds in bilateral lower lobes. Palpable pulse regular on my exam.   Lab work remarkable for creatinine 1.77. BNP  186. CXR shows vascular congestion. Medtronic tech interrogated device. He had two episodes of VT with rate 240s. First episode shocked, second was successfully paced. Device functioning as programed. Spoke to cardiology fellow who will admit patient. Patient, ED treatment and discharge plan was discussed with supervising physician who is agreeable with plan.   Final Clinical Impressions(s) / ED Diagnoses   Final diagnoses:  None    ED Discharge Orders    None       Liberty Handy, [name] 09/10/17 [id number]    Gilda Crease, MD 09/10/17 [phone]

## 2017-09-10 NOTE — ED Notes (Signed)
Heart Healthy diet breakfast tray ordered @ 0805.  

## 2017-09-10 NOTE — ED Notes (Signed)
Patient denies pain and is resting comfortably.  

## 2017-09-10 NOTE — Consult Note (Signed)
Cardiology Consultation:   Patient ID: Gary Hutchinson; [id number]; 10-27-1960   Admit date: 09/10/2017 Date of Consult: 09/10/2017  Primary Care Provider: Creola Corn, MD Primary Cardiologist: Dr. Gala Romney Primary Electrophysiologist:  Dr. [name]   Patient Profile:   Gary Hutchinson is a 56 y.o. 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 Allena Katz .  History of Present Illness:   Gary Hutchinson has been feeling very well of late, walking getting 02-6999 steps most days in with good exertional capacity, saw Dr. Gala Romney in October, was planned for DCCV back in his AFlutter though arrived to procedure in SR.  He denies any kind of CP, no DOE out of his baseline, had labs done a week ago with elevated Creat and his diuretics were adjusted though he only did this for 2 days with some swelling, resumed his usual dosing.  He reports compliance with his medicines, no missed doses of anything, and no other changes of any kind.  He reports yesterday being a good day, had been out all day, shopping, running errands, groceries, carrying bags without trouble, at the end of the day did start to feel like he had maybe overdone it feeling somewhat suddenly tired.  He decided to go ahead and take the garbage cans out to the road and call it a day.  He rolled his cans to the curb, mentioning he has a long drivway when he got shocked.  He did not feel near syncopal or have warning, did not faint, no particular symptoms afterwards either, other then discomfort/anxiety from shock.  LABS K+ 3.8 BUN/Creat 52/1.77 (doen from 2.15, baseline looks about 1.6) poc Trop 0.02 BNP 186 WBC 9.0 H/H 11.3/35.7 Plts 186  Device information MDT ICD implanted 2009, upgrade to CRT-D 11/16/15, Dr. [name]  + inappropriate  shock for rapid Aflutter (atypical) in the setting of illness w/flu AAD: amiodarone developed thyrotoxicity  Device interrogation: Battery and lead measurements are good Yesterday started to have AF around 5pm 1 shock and 1 ATP Both for 1:1 tachycardia, cycle length 270-253ms     Past Medical History:  Diagnosis Date  . AICD (automatic cardioverter/defibrillator) present    Medtronic  . Alcohol abuse    now quit  . Anemia    iron defi  . Arthritis    "fingers, knees, some days all my joints ache" (11/16/2015)  . Asthma   . Cholecystitis   . Chronic systolic congestive heart failure (HCC)    a. RHC (02/2011) RA 31/27, RV 71/27, PA67/45, PCWP 46, PA 49%, Fick CO: 3.4 b. ECHO (03/2014) EF 30-35%, grade II DD, mild Gary, RV poorly visualized appears mildly decreased c. RHC (05/2014) RA 13, RV 54/4/11, PA 60/22 (37), PCWP 17, Fick CO/CI: 4.4 / 1.9, Thermo CO/CI: 3.8 /1.6, PVR 5.2 WU, PA 57% and 59%  . Diverticulosis of colon   . Gout   . Hemorrhoids, internal   . Hyperlipidemia   . Hypertension   . Morbid obesity (HCC)   . Nonischemic cardiomyopathy (HCC)    a. LHC (02/2011) normal coronaries  . OSA on CPAP   . Paroxysmal atrial fibrillation (HCC)   . Pneumonia 2000s X 1  . Pulmonary hypertension (HCC)   . Renal insufficiency   . Type II diabetes mellitus (HCC)   .  Ventricular tachycardia [facility])    s/p MDT ICD implant    Past Surgical History:  Procedure Laterality Date  . CARDIAC CATHETERIZATION  03/08/2004   EF 25-30%  . CARDIOVERSION N/A 10/17/2015   Procedure: CARDIOVERSION;  Surgeon: [name], MD;  Location: [facility] ENDOSCOPY;  Service: Cardiovascular;  Laterality: N/A;  . EP IMPLANTABLE DEVICE N/A 11/16/2015   Procedure: BiVI Upgrade;  Surgeon: Hillis Range, MD;  [employer] [address] [place]  . INSERTION OF ICD  06/2008   ICD- Medtronic   . RIGHT HEART CATHETERIZATION N/A 05/23/2014   Procedure: RIGHT HEART CATH;  Surgeon: [name], MD;  Location: [facility] CATH LAB;  Service:  Cardiovascular;  Laterality: N/A;  . TRANSTHORACIC ECHOCARDIOGRAM  05/27/2008   EF 30-35%  . US ECHOCARDIOGRAPHY  08/22/2008   EF 30-35%       Inpatient Medications: Scheduled Meds: . allopurinol  300 mg Oral Daily  . atorvastatin  40 mg Oral Daily  . bisoprolol  5 mg Oral Daily  . [START ON 09/11/2017] digoxin  0.0625 mg Oral QODAY  . hydrALAZINE  37.5 mg Oral TID  . insulin detemir  36 Units Subcutaneous QHS  . isosorbide mononitrate  30 mg Oral BID  . mometasone-formoterol  2 puff Inhalation BID  . rivaroxaban  20 mg Oral Q supper  . sacubitril-valsartan  1 tablet Oral BID  . spironolactone  12.5 mg Oral Daily  . torsemide  20 mg Oral QPM  . torsemide  40 mg Oral Daily   Continuous Infusions:  PRN Meds: acetaminophen, albuterol, ondansetron (ZOFRAN) IV  Allergies:   No Known Allergies  Social History:   Social History   Socioeconomic History  . Marital status: Single    Spouse name: Not on file  . Number of children: 4  . Years of education: Not on file  . Highest education level: Not on file  Social Needs  . Financial resource strain: Not on file  . Food insecurity - worry: Not on file  . Food insecurity - inability: Not on file  . Transportation needs - medical: Not on file  . Transportation needs - non-medical: Not on file  Occupational History    Employer: DISABLED  Tobacco Use  . Smoking status: Never Smoker  . Smokeless tobacco: Never Used  Substance and Sexual Activity  . Alcohol use: Yes    Comment: former heavy ETOH, quit in "the late '90s"  . Drug use: No  . Sexual activity: Not Currently  Other Topics Concern  . Not on file  Social History Narrative   disabled    Family History:    Family History  Problem Relation Age of Onset  . Cancer Father   . Diabetes Unknown        grandparents  . Hypertension Mother   . CAD Unknown        No family history     ROS:  Please see the history of present illness.  ROS  All other ROS reviewed  and negative.     Physical Exam/Data:   Vitals:   09/10/17 0645 09/10/17 0700 09/10/17 0715 09/10/17 0745  BP: 100/72  (!) 124/93 115/72  Pulse: 84 (!) 121  61  Resp: 20 (!) 27 (!) 25 (!) 24  Temp:      TempSrc:      SpO2: 96% 97%  97%   No intake or output data in the 24 hours ending 09/10/17 0807 There were no vitals filed for this visit.  There is no height or weight on file to calculate BMI.  General:  Well nourished, well developed, in no acute distress HEENT: normal Lymph: no adenopathy Neck: no JVD Endocrine:  No thryomegaly Vascular: No carotid bruits Cardiac:  iRRR; no murmurs, gallops or rubs Lungs:  clear to auscultation bilaterally, no wheezing, rhonchi or rales  Abd: soft, non-tender, obese  Ext: no edema Musculoskeletal:  No deformities  Skin: warm and dry  Neuro:  No gross focal abnormalities noted Psych:  Normal affect   EKG:  The EKG was personally reviewed and demonstrates:   #1 AFlutter 118bpm, V paced #2 Aflutter 83bpm, V paced, variable QRS duration 07/29/17 SR, V paced QRS 104ms Telemetry:  Telemetry was personally reviewed and demonstrates:   Currently AFlutter CVR with intermittent V pacing  Relevant CV Studies:  07/27/17: TTE Study Conclusions - Left ventricle: The cavity size was moderately dilated. Systolic   function was severely reduced. The estimated ejection fraction   was in the range of 25% to 30%. Wall motion was normal; there   were no regional wall motion abnormalities. Features are   consistent with a pseudonormal left ventricular filling pattern,   with concomitant abnormal relaxation and increased filling   pressure (grade 2 diastolic dysfunction). Doppler parameters are   consistent with elevated ventricular end-diastolic filling   pressure. - Aortic valve: Trileaflet; normal thickness leaflets. - Mitral valve: There was mild regurgitation. - Left atrium: The atrium was severely dilated. - Right ventricle: Systolic function  was normal. - Right atrium: The atrium was mildly dilated. Pacer wire or   catheter noted in right atrium. - Tricuspid valve: There was mild regurgitation. - Pulmonary arteries: Systolic pressure was mildly increased. PA   peak pressure: 32 mm Hg (S). - Inferior vena cava: The vessel was normal in size.  05/03/12: EF 25-30%. Grade 1 diastolic dysfunction. Mild Gary. Mod dilated LA.  07/27/13: EF 40%, diff HK, Gary mild, LA mild/mod dilated 03/2014: EF30-35%, grade II DD, mild Gary, RV systolic fx mildly decreased 10/2014: EF 40%.  10/2015: EF ~40%. RV mildly dilated.  8/17 EF 25-35%    Laboratory Data:  Chemistry Recent Labs  Lab 09/04/17 1116 09/09/17 1959  NA 136 138  K 3.9 3.8  CL 100* 102  CO2 26 22  GLUCOSE 194* 195*  BUN 75* 52*  CREATININE 2.15* 1.77*  CALCIUM 9.1 9.3  GFRNONAA 33* 41*  GFRAA 38* 48*  ANIONGAP 10 14    No results for input(s): PROT, ALBUMIN, AST, ALT, ALKPHOS, BILITOT in the last 168 hours. Hematology Recent Labs  Lab 09/09/17 1959  WBC 9.0  RBC 4.04*  HGB 11.3*  HCT 35.7*  MCV 88.4  MCH 28.0  MCHC 31.7  RDW 16.6*  PLT 186   Cardiac EnzymesNo results for input(s): TROPONINI in the last 168 hours.  Recent Labs  Lab 09/09/17 2020  TROPIPOC 0.02    BNP Recent Labs  Lab 09/10/17 0055  BNP 186.7*    DDimer No results for input(s): DDIMER in the last 168 hours.  Radiology/Studies:   Dg Chest 2 View Result Date: 09/09/2017 CLINICAL DATA:  ICD activation. EXAM: CHEST  2 VIEW COMPARISON:  11/25/2016 FINDINGS: ICD device with stable positioning of the leads. Enlarged cardiac silhouette. There is no evidence of focal airspace consolidation, pleural effusion or pneumothorax. Mild pulmonary vascular congestion. Osseous structures are without acute abnormality. Soft tissues are grossly normal. IMPRESSION: Enlarged cardiac silhouette with mild pulmonary vascular congestion. Electronically Signed   By:  Ted Mcalpine M.D.   On: 09/09/2017  20:57    Assessment and Plan:   1. ICD shock     Inappropriate for 1:1 AFlutter      2. Persistent Aflutter, atypical     CHA2DS2Vasc is 3, on Xarelto  Super morbid obesity, LA desrcibed as severely enlarged, measured 35mm will make rhythm control challenging.  He has CRI, this worries me for Tikosyn, Sotalol, though given his weight Creat clearance looks OK, I am not certain these are good options.  Baseline Creat looks about 1.5-1.6 though gets over 2 intermittently.  He developed thyroid toxicity with amiodarone.  May start with up-titrating his bisoprolol to BP tolerance as 1st choice, he did not convert with his shock to SR.  I do not think dccv without an AAD indicated at this time.  Dr. [name] will see  4. NICM, chronic CHF     Without significant symptoms of fluid OL, OptiVol looks Ok     Mild congestion on CXR, minimal edema, continue home diuretics         For questions or updates, please contact [facility] Please consult [email or web address] for contact info under Cardiology/STEMI.   Signed, Sheilah Pigeon, [name]  09/10/2017 8:07 AM   I have seen, examined the patient, and reviewed the above assessment and plan.  Changes to above are made where necessary.  On exam, iRRR.  Euvolemic.  I had a long discussion with Gary Hutchinson (whome I know well).  He  Is s/p ICD shock for afib with RVR.  Device interrogation is reviewed at length.  No real options for reprogramming.  Would not advise tikosyn due to renal failure or amiodarone due to thyroid disease.  Could consider ablation if episodes continue. Start sotalol 80mg  BID and follow-up early next week in the AF clinic for repeat ekg.  Will need cardioversion arranged if still in AF.  Could also increase sotalol to 120mg  BID if tolerating and qt allows at that time.  He reports compliance with anticoagulation without interruption. No driving until his arrhythmia is better controlled.  Co Sign: Hillis Range, MD

## 2017-09-10 NOTE — ED Notes (Signed)
Renee, PA aware of pt BP. Pt asymptomatic at this time

## 2017-09-10 NOTE — Discharge Instructions (Signed)
No driving until cleared to in follow up.

## 2017-09-10 NOTE — ED Notes (Addendum)
Pt asking to speak with cardiology one more time for clarification on medications, cards paged

## 2017-09-10 NOTE — ED Notes (Signed)
Pt here, not discharged. Charted in error.

## 2017-09-10 NOTE — ED Notes (Signed)
[employer] guy paged

## 2017-09-10 NOTE — ED Notes (Signed)
[employer] technician notified by secretary to interrogate/evaluate pt.'s defibrillator .

## 2017-09-10 NOTE — ED Notes (Signed)
Per provider, pt to be d/c. Awaiting d/c instructions from admitting provider

## 2017-09-10 NOTE — ED Notes (Signed)
Admitting at the bedside.  

## 2017-09-10 NOTE — ED Notes (Signed)
Admitting MD paged to [facility] @ [phone] to [phone] regarding status of NPO.

## 2017-09-14 ENCOUNTER — Ambulatory Visit (INDEPENDENT_AMBULATORY_CARE_PROVIDER_SITE_OTHER): Payer: Medicare Other

## 2017-09-14 ENCOUNTER — Ambulatory Visit (HOSPITAL_COMMUNITY): Payer: Medicare Other | Admitting: Nurse Practitioner

## 2017-09-14 DIAGNOSIS — I5022 Chronic systolic (congestive) heart failure: Secondary | ICD-10-CM | POA: Diagnosis not present

## 2017-09-14 DIAGNOSIS — Z95810 Presence of automatic (implantable) cardiac defibrillator: Secondary | ICD-10-CM

## 2017-09-15 ENCOUNTER — Ambulatory Visit (HOSPITAL_COMMUNITY): Payer: Medicare Other | Admitting: Nurse Practitioner

## 2017-09-16 ENCOUNTER — Encounter (HOSPITAL_COMMUNITY): Payer: Self-pay | Admitting: Nurse Practitioner

## 2017-09-16 ENCOUNTER — Ambulatory Visit (HOSPITAL_COMMUNITY)
Admission: RE | Admit: 2017-09-16 | Discharge: 2017-09-16 | Disposition: A | Discharge status: Home / Self Care | Payer: Medicare Other | Source: Ambulatory Visit | Attending: Nurse Practitioner | Admitting: Nurse Practitioner

## 2017-09-16 ENCOUNTER — Other Ambulatory Visit: Payer: Self-pay | Admitting: Internal Medicine

## 2017-09-16 VITALS — BP 102/58 | HR 77 | Ht 65.0 in | Wt 297.8 lb

## 2017-09-16 DIAGNOSIS — Z794 Long term (current) use of insulin: Secondary | ICD-10-CM | POA: Diagnosis not present

## 2017-09-16 DIAGNOSIS — I110 Hypertensive heart disease with heart failure: Secondary | ICD-10-CM | POA: Diagnosis not present

## 2017-09-16 DIAGNOSIS — Z7901 Long term (current) use of anticoagulants: Secondary | ICD-10-CM | POA: Insufficient documentation

## 2017-09-16 DIAGNOSIS — M109 Gout, unspecified: Secondary | ICD-10-CM | POA: Insufficient documentation

## 2017-09-16 DIAGNOSIS — Z833 Family history of diabetes mellitus: Secondary | ICD-10-CM | POA: Diagnosis not present

## 2017-09-16 DIAGNOSIS — I429 Cardiomyopathy, unspecified: Secondary | ICD-10-CM | POA: Insufficient documentation

## 2017-09-16 DIAGNOSIS — Z79899 Other long term (current) drug therapy: Secondary | ICD-10-CM | POA: Diagnosis not present

## 2017-09-16 DIAGNOSIS — G4733 Obstructive sleep apnea (adult) (pediatric): Secondary | ICD-10-CM | POA: Insufficient documentation

## 2017-09-16 DIAGNOSIS — E119 Type 2 diabetes mellitus without complications: Secondary | ICD-10-CM | POA: Diagnosis not present

## 2017-09-16 DIAGNOSIS — I272 Pulmonary hypertension, unspecified: Secondary | ICD-10-CM | POA: Insufficient documentation

## 2017-09-16 DIAGNOSIS — K648 Other hemorrhoids: Secondary | ICD-10-CM | POA: Diagnosis not present

## 2017-09-16 DIAGNOSIS — E785 Hyperlipidemia, unspecified: Secondary | ICD-10-CM | POA: Insufficient documentation

## 2017-09-16 DIAGNOSIS — Z809 Family history of malignant neoplasm, unspecified: Secondary | ICD-10-CM | POA: Diagnosis not present

## 2017-09-16 DIAGNOSIS — Z9889 Other specified postprocedural states: Secondary | ICD-10-CM | POA: Insufficient documentation

## 2017-09-16 DIAGNOSIS — Z8249 Family history of ischemic heart disease and other diseases of the circulatory system: Secondary | ICD-10-CM | POA: Diagnosis not present

## 2017-09-16 DIAGNOSIS — I480 Paroxysmal atrial fibrillation: Secondary | ICD-10-CM

## 2017-09-16 DIAGNOSIS — I5022 Chronic systolic (congestive) heart failure: Secondary | ICD-10-CM | POA: Diagnosis not present

## 2017-09-16 DIAGNOSIS — J45909 Unspecified asthma, uncomplicated: Secondary | ICD-10-CM | POA: Insufficient documentation

## 2017-09-16 DIAGNOSIS — Z95810 Presence of automatic (implantable) cardiac defibrillator: Secondary | ICD-10-CM | POA: Insufficient documentation

## 2017-09-16 LAB — MAGNESIUM: Magnesium: 2.3 mg/dL (ref 1.7–2.4)

## 2017-09-16 LAB — BASIC METABOLIC PANEL
Anion gap: 7 (ref 5–15)
BUN: 33 mg/dL — AB (ref 6–20)
CALCIUM: 9.1 mg/dL (ref 8.9–10.3)
CHLORIDE: 103 mmol/L (ref 101–111)
CO2: 27 mmol/L (ref 22–32)
CREATININE: 1.48 mg/dL — AB (ref 0.61–1.24)
GFR calc Af Amer: 59 mL/min — ABNORMAL LOW (ref >60)
GFR calc non Af Amer: 51 mL/min — ABNORMAL LOW (ref >60)
GLUCOSE: 123 mg/dL — AB (ref 65–99)
Potassium: 4.7 mmol/L (ref 3.5–5.1)
Sodium: 137 mmol/L (ref 135–145)

## 2017-09-16 NOTE — Progress Notes (Signed)
Primary Care Physician: Gary Corn, MD Referring Physician: Dr. [name] is a 56 y.o. male with a h/o chronic systolic heart failure,s/p MDT ICD, Htn,atrial fibrillation DM, OSA on Cpap, hx of amio thyrotoxicity, obesity, that is in the afib clinic for f/u recent ER visit.Marland Kitchen He received a shock from his ICD after a day full of activities, over 7000 steps. He was found to have rapid afib with RVR which triggered the shock. Dr. [name] felt antiarrythmic therapy was indicated and started 80 mg sotalol bid and he was discharged home. He is a sensed v paced in clinic with normal intervals. Qtc at 482 ms.  Device interrogated and he was in afib from 12-5 to 12-10 and since then has been in SR. No further shocks. He feels well. He felt a little tired when drug first started  but feels better now. He is trying to stay on top of his fluid status and track his total fluid/salt intake better.  Today, he denies symptoms of palpitations, chest pain, shortness of breath, orthopnea, PND, lower extremity edema, dizziness, presyncope, syncope, or neurologic sequela. The patient is tolerating medications without difficulties and is otherwise without complaint today.   Past Medical History:  Diagnosis Date  . AICD (automatic cardioverter/defibrillator) present    [employer]tronic  . Alcohol abuse    now quit  . Anemia    iron defi  . Arthritis    "fingers, knees, some days all my joints ache" (11/16/2015)  . Asthma   . Cholecystitis   . Chronic systolic congestive heart failure (HCC)    a. RHC (02/2011) RA 31/27, RV 71/27, PA67/45, PCWP 46, PA 49%, Fick CO: 3.4 b. ECHO (03/2014) EF 30-35%, grade II DD, mild MR, RV poorly visualized appears mildly decreased c. RHC (05/2014) RA 13, RV 54/4/11, PA 60/22 (37), PCWP 17, Fick CO/CI: 4.4 / 1.9, Thermo CO/CI: 3.8 /1.6, PVR 5.2 WU, PA 57% and 59%  . Diverticulosis of colon   . Gout   . Hemorrhoids, internal   . Hyperlipidemia   . Hypertension   . Morbid  obesity (HCC)   . Nonischemic cardiomyopathy (HCC)    a. LHC (02/2011) normal coronaries  . OSA on CPAP   . Paroxysmal atrial fibrillation (HCC)   . Pneumonia 2000s X 1  . Pulmonary hypertension (HCC)   . Renal insufficiency   . Type II diabetes mellitus (HCC)   . Ventricular tachycardia [facility])    s/p MDT ICD implant   Past Surgical History:  Procedure Laterality Date  . CARDIAC CATHETERIZATION  03/08/2004   EF 25-30%  . CARDIOVERSION N/A 10/17/2015   Procedure: CARDIOVERSION;  Surgeon: [name], MD;  Location: [facility] ENDOSCOPY;  Service: Cardiovascular;  Laterality: N/A;  . EP IMPLANTABLE DEVICE N/A 11/16/2015   Procedure: BiVI Upgrade;  Surgeon: Hillis Range, MD;  [employer] [address] [place]  . INSERTION OF ICD  06/2008   ICD- [employer]   . RIGHT HEART CATHETERIZATION N/A 05/23/2014   Procedure: RIGHT HEART CATH;  Surgeon: [name], MD;  Location: [facility] CATH LAB;  Service: Cardiovascular;  Laterality: N/A;  . TRANSTHORACIC ECHOCARDIOGRAM  05/27/2008   EF 30-35%  . US ECHOCARDIOGRAPHY  08/22/2008   EF 30-35%    Current Outpatient Medications  Medication Sig Dispense Refill  . acetaminophen (TYLENOL) 325 MG tablet Take 325-650 mg by mouth every 6 (six) hours as needed (for pain). Reported on 10/11/2015    . albuterol (PROAIR HFA) 108 (90 BASE)  MCG/ACT inhaler Inhale 2 puffs into the lungs every 6 (six) hours as needed for wheezing or shortness of breath. Reported on 02/06/2016    . allopurinol (ZYLOPRIM) 300 MG tablet Take 300 mg by mouth daily.    Marland Kitchen atorvastatin (LIPITOR) 40 MG tablet TAKE 1 TABLET (40 MG TOTAL) BY MOUTH DAILY. 90 tablet 2  . colchicine 0.6 MG tablet Take 0.6 mg by mouth daily as needed (for gout flares). Reported on 02/06/2016    . digoxin (LANOXIN) 0.125 MG tablet Take 0.5 tablets (0.0625 mg total) by mouth every other day. 45 tablet 3  . Fluticasone-Salmeterol (ADVAIR DISKUS) 250-50 MCG/DOSE AEPB Inhale 1 puff into the lungs every 12 (twelve) hours as needed (for  flares/shortness of breath). Reported on 02/06/2016    . hydrALAZINE (APRESOLINE) 25 MG tablet Take 1.5 tablets (37.5 mg total) by mouth 3 (three) times daily. 405 tablet 3  . insulin detemir (LEVEMIR) 100 UNIT/ML injection Inject 36 Units into the skin at bedtime.     . isosorbide mononitrate (IMDUR) 30 MG 24 hr tablet TAKE 1 TABLET (30 MG TOTAL) BY MOUTH 2 TIMES DAILY. (Patient taking differently: Take 30 mg by mouth 2 (two) times daily. ) 180 tablet 3  . linagliptin (TRADJENTA) 5 MG TABS tablet Take 1 tablet (5 mg total) by mouth daily. 30 tablet 3  . metolazone (ZAROXOLYN) 2.5 MG tablet Take 2.5 mg by mouth daily as needed (for edema). Reported on 02/06/2016    . montelukast (SINGULAIR) 10 MG tablet Take 10 mg by mouth daily as needed (for shortness of breath, wheezing, or flares).     . rivaroxaban (XARELTO) 20 MG TABS tablet Take 1 tablet (20 mg total) by mouth daily with supper. 90 tablet 3  . sacubitril-valsartan (ENTRESTO) 49-51 MG Take 1 tablet by mouth 2 (two) times daily. 180 tablet 3  . sotalol (BETAPACE) 80 MG tablet Take 1 tablet (80 mg total) by mouth 2 (two) times daily. 60 tablet 6  . spironolactone (ALDACTONE) 25 MG tablet Take 12.5 mg by mouth daily.     Marland Kitchen torsemide (DEMADEX) 20 MG tablet Take 20-40 mg by mouth See admin instructions. 40 mg in the morning and 20 mg in the evening     No current facility-administered medications for this encounter.     No Known Allergies  Social History   Socioeconomic History  . Marital status: Single    Spouse name: Not on file  . Number of children: 4  . Years of education: Not on file  . Highest education level: Not on file  Social Needs  . Financial resource strain: Not on file  . Food insecurity - worry: Not on file  . Food insecurity - inability: Not on file  . Transportation needs - medical: Not on file  . Transportation needs - non-medical: Not on file  Occupational History    Employer: DISABLED  Tobacco Use  . Smoking  status: Never Smoker  . Smokeless tobacco: Never Used  Substance and Sexual Activity  . Alcohol use: Yes    Comment: former heavy ETOH, quit in "the late '90s"  . Drug use: No  . Sexual activity: Not Currently  Other Topics Concern  . Not on file  Social History Narrative   disabled    Family History  Problem Relation Age of Onset  . Cancer Father   . Diabetes Unknown        grandparents  . Hypertension Mother   . CAD Unknown  No family history    ROS- All systems are reviewed and negative except as per the HPI above  Physical Exam: Vitals:   09/16/17 1317  BP: (!) 102/58  Pulse: 77  Weight: 297 lb 12.8 oz (135.1 kg)  Height: 5\' 5"  (1.651 m)   Wt Readings from Last 3 Encounters:  09/16/17 297 lb 12.8 oz (135.1 kg)  09/04/17 300 lb (136.1 kg)  07/29/17 295 lb (133.8 kg)    Labs: Lab Results  Component Value Date   NA 138 09/09/2017   K 3.8 09/09/2017   CL 102 09/09/2017   CO2 22 09/09/2017   GLUCOSE 195 (H) 09/09/2017   BUN 52 (H) 09/09/2017   CREATININE 1.77 (H) 09/09/2017   CALCIUM 9.3 09/09/2017   PHOS 4.1 03/04/2013   MG 2.1 11/26/2016   Lab Results  Component Value Date   INR 1.94 07/27/2017   Lab Results  Component Value Date   CHOL 181 04/09/2011   HDL 37 (L) 04/09/2011   LDLCALC 117 (H) 04/09/2011   TRIG 137 04/09/2011     GEN- The patient is well appearing, alert and oriented x 3 today.   Head- normocephalic, atraumatic Eyes-  Sclera clear, conjunctiva pink Ears- hearing intact Oropharynx- clear Neck- supple, no JVP Lymph- no cervical lymphadenopathy Lungs- Clear to ausculation bilaterally, normal work of breathing Heart- Regular rate and rhythm, no murmurs, rubs or gallops, PMI not laterally displaced GI- soft, NT, ND, + BS Extremities- no clubbing, cyanosis, or edema MS- no significant deformity or atrophy Skin- no rash or lesion Psych- euthymic mood, full affect Neuro- strength and sensation are intact  EKG- atrial  sensed ventricular paced rhythm at 77 bpm, pr int 148 ms, qrs int 114 ms, qtc 482 ms Brief interrogation done to determine afib burden. In afib 12-5 to 12- 10 and has been back in SR since then      Assessment and Plan: 1. Afib with RVR with shock Started on 80 mg sotalol bid and has been in SR since 12/10 Intervals look acceptable on sotalol Bmet/mag today, keep K+ above 4 and mag above 2 on sotalol  Continue xarelto 20 mg daily for chadsvasc score of at least 3  2. Chronic systolic heart failure Daily weights Torsemide as instructed Watch total fluid intake  3. HTN  Stable   F/u with Dr. Gala RomneyBensimhon 1/30 Remote check 1/3  Elvina SidleDonna C. Matthew Folksarroll, ANP-C Af[facility] Clinic [facility] [address] [place], [place] [address] [phone]

## 2017-09-18 NOTE — Progress Notes (Signed)
EPIC Encounter for ICM Monitoring  Patient Name: Gary Hutchinson is a 56 y.o. male Date: 09/18/2017 Primary Care Physican: Shon Baton, MD Primary Cardiologist:[name] Electrophysiologist: Allred Dry Weight: Last weight 292lbs Bi-V Pacing: 46.6 % (was 93.9 % on 08/03/2017 transmission)   Clinical Status (10-Sep-2017 to 14-Sep-2017) Treated VT/VF 0 episodes AT/AF 1 episode  Time in AT/AF 24.0 hr/day (100.0%)  Observations (2) (10-Sep-2017 to 14-Sep-2017)  AT/AF >= 1 hr for 5 days.  V. Pacing less than 90%.                                                      Transmission reviewed.  Hospitalized 12/5 to 12/6 for ICD Shock   Thoracic impedance abnormal suggesting fluid accumulation from 09/05/2017 and returned to baseline on 09/14/2017..  Prescribed dosage: Torsemide 20 mg 2 tablets (40 mg total) in AMand 1 tablet (20 mg total) every PM. Metolazone 2.5 mg 1 tablet as needed.  Labs: 09/16/2017 Creatinine 1.48, BUN 33, Potassium 4.7, Sodium 137, EGFR 51-59 09/09/2017 Creatinine 1.77, BUN 52, Potassium 3.8, Sodium 138, EGFR 41-48  09/04/2017 Creatinine 2.15, BUN 75, Potassium 3.9, Sodium 136, EGFR 33-38  07/27/2017 Creatinine 1.60, BUN 46, Potassium 3.6, Sodium 136, EGFR 47-54  04/27/2017 Creatinine 1.82, BUN 47, Potassium 4.2, Sodium 137, EGFR 40-46 04/13/2017 Creatinine 1.60, BUN 42, Potassium 3.6, Sodium 138, EGFR 47-54 12/29/2016 Creatinine 1.16, BUN 21, Potassium 4.1, Sodium 139, EGFR >60 12/03/2016 Creatinine 1.54, BUN 34, Potassium 4.0, Sodium 136, EGFR 49-57  11/27/2016 Creatinine 1.38, BUN 26, Potassium 3.8, Sodium 138, EGFR 56->60  11/26/2016 Creatinine 1.97, BUN 44, Potassium 3.5, Sodium 138, EGFR 36-42  02/20/2018Creatinine 2.32, BUN 48, Potassium 3.5, Sodium 135, EGFR 30-35  09/10/2016 Creatinine 1.36, BUN 20, Potassium 4.5, Sodium 139, EGFR 57->60 09/03/2016 Creatinine 1.39, BUN 34, Potassium 4.8, Sodium 136, EGFR 56->60  08/06/2016 Creatinine 1.41, BUN  21, Potassium 4.7, Sodium 131, EGFR 55->60  06/24/2016 Creatinine 1.16, BUN 27, Potassium 3.7, Sodium 137, EGFR >60   Recommendations: NONE   Follow-up plan: ICM clinic phone appointment on 10/15/2017.    Copy of ICM check sent to Dr. Rayann Heman and [name].      3 month ICM trend: 09/14/2017    1 Year ICM trend:       [name], RN 09/18/2017 4:10 PM

## 2017-10-04 ENCOUNTER — Other Ambulatory Visit (HOSPITAL_COMMUNITY): Payer: Self-pay | Admitting: Internal Medicine

## 2017-10-13 ENCOUNTER — Encounter (HOSPITAL_COMMUNITY): Payer: Self-pay

## 2017-10-15 ENCOUNTER — Ambulatory Visit (INDEPENDENT_AMBULATORY_CARE_PROVIDER_SITE_OTHER): Payer: Medicare Other | Admitting: *Deleted

## 2017-10-15 DIAGNOSIS — I5022 Chronic systolic (congestive) heart failure: Secondary | ICD-10-CM | POA: Diagnosis not present

## 2017-10-15 DIAGNOSIS — I428 Other cardiomyopathies: Secondary | ICD-10-CM | POA: Diagnosis not present

## 2017-10-15 DIAGNOSIS — Z95810 Presence of automatic (implantable) cardiac defibrillator: Secondary | ICD-10-CM | POA: Diagnosis not present

## 2017-10-15 NOTE — Progress Notes (Signed)
Remote ICD transmission.   

## 2017-10-15 NOTE — Progress Notes (Signed)
EPIC Encounter for ICM Monitoring  Patient Name: Gary Hutchinson is a 57 y.o. male Date: 10/15/2017 Primary Care Physican: Shon Baton, MD Primary Cardiologist:[name] Electrophysiologist: Allred Dry Weight: 297lbs Bi-V Pacing: 98%   Clinical Status (16-Sep-2017 to 15-Oct-2017) Time in AT/AF <0.1 hr/day (<0.1%) Longest AT/AF 2 minutes       Heart Failure questions reviewed, pt symptomatic with weight gain of a few pounds.   Thoracic impedance abnormal suggesting fluid accumulation since 10/08/2017.  Prescribed dosage: Torsemide 20 mg 2 tablets (40 mg total) in AMand 1 tablet (20 mg total) every PM. Metolazone 2.5 mg 1 tablet as needed.  Labs: 09/16/2017 Creatinine 1.48, BUN 33, Potassium 4.7, Sodium 137, EGFR 51-59 09/09/2017 Creatinine 1.77, BUN 52, Potassium 3.8, Sodium 138, EGFR 41-48  09/04/2017 Creatinine 2.15, BUN 75, Potassium 3.9, Sodium 136, EGFR 33-38  07/27/2017 Creatinine 1.60, BUN 46, Potassium 3.6, Sodium 136, EGFR 47-54  04/27/2017 Creatinine 1.82, BUN 47, Potassium 4.2, Sodium 137, EGFR 40-46 04/13/2017 Creatinine 1.60, BUN 42, Potassium 3.6, Sodium 138, EGFR 47-54 12/29/2016 Creatinine 1.16, BUN 21, Potassium 4.1, Sodium 139, EGFR >60 12/03/2016 Creatinine 1.54, BUN 34, Potassium 4.0, Sodium 136, EGFR 49-57  11/27/2016 Creatinine 1.38, BUN 26, Potassium 3.8, Sodium 138, EGFR 56->60  11/26/2016 Creatinine 1.97, BUN 44, Potassium 3.5, Sodium 138, EGFR 36-42  02/20/2018Creatinine 2.32, BUN 48, Potassium 3.5, Sodium 135, EGFR 30-35  09/10/2016 Creatinine 1.36, BUN 20, Potassium 4.5, Sodium 139, EGFR 57->60 09/03/2016 Creatinine 1.39, BUN 34, Potassium 4.8, Sodium 136, EGFR 56->60  08/06/2016 Creatinine 1.41, BUN 21, Potassium 4.7, Sodium 131, EGFR 55->60  06/24/2016 Creatinine 1.16, BUN 27, Potassium 3.7, Sodium 137, EGFR >60  Recommendations:  Patient said he would take a Metolazone tomorrow morning to help alleviate the fluid accumulation.  Follow-up  plan: ICM clinic phone appointment on 10/29/2017 since patient has office appointment scheduled 10/23/2017 with Dr. Rayann Heman.  Copy of ICM check sent to Dr. Rayann Heman and Dr. Haroldine Laws.   3 month ICM trend: 10/15/2017    AT/AF   1 Year ICM trend:       [name], RN 10/15/2017 4:05 PM

## 2017-10-23 ENCOUNTER — Encounter: Payer: Medicare Other | Admitting: Internal Medicine

## 2017-10-23 ENCOUNTER — Encounter: Payer: Self-pay | Admitting: Cardiology

## 2017-10-29 ENCOUNTER — Ambulatory Visit (INDEPENDENT_AMBULATORY_CARE_PROVIDER_SITE_OTHER): Payer: Self-pay

## 2017-10-29 ENCOUNTER — Telehealth: Payer: Self-pay

## 2017-10-29 DIAGNOSIS — I5022 Chronic systolic (congestive) heart failure: Secondary | ICD-10-CM

## 2017-10-29 DIAGNOSIS — Z95810 Presence of automatic (implantable) cardiac defibrillator: Secondary | ICD-10-CM

## 2017-10-29 NOTE — Progress Notes (Signed)
EPIC Encounter for ICM Monitoring  Patient Name: Gary Hutchinson is a 57 y.o. male Date: 10/29/2017 Primary Care Physican: Shon Baton, MD Primary Cardiologist:[name] Electrophysiologist: Allred Dry Weight: Prior weight 297lbs Bi-V Pacing: 98%      Attempted call to patient and unable to reach.  Left message to return call.  Transmission reviewed.    Thoracic impedance returned to normal since last ICM transmission on 10/15/17.   Prescribed dosage: Torsemide 20 mg 2 tablets (40 mg total) in AMand 1 tablet (20 mg total) every PM. Metolazone 2.5 mg 1 tablet as needed.  Labs: 09/16/2017 Creatinine1.48, BUN33, Potassium4.7, [id number], [id number] 09/09/2017 Creatinine1.77, BUN52, Potassium3.8, [id number], [id number]  09/04/2017 Creatinine2.15, BUN75, Potassium3.9, Sodium136, [id number]  10/22/2018Creatinine 1.60, BUN46, Potassium3.6, [id number], [id number] 04/27/2017 Creatinine 1.82, BUN 47, Potassium 4.2, Sodium 137, EGFR 40-46 04/13/2017 Creatinine 1.60, BUN 42, Potassium 3.6, Sodium 138, EGFR 47-54 12/29/2016 Creatinine 1.16, BUN 21, Potassium 4.1, Sodium 139, EGFR >60 12/03/2016 Creatinine 1.54, BUN 34, Potassium 4.0, Sodium 136, EGFR 49-57  11/27/2016 Creatinine 1.38, BUN 26, Potassium 3.8, Sodium 138, EGFR 56->60  11/26/2016 Creatinine 1.97, BUN 44, Potassium 3.5, Sodium 138, EGFR 36-42  02/20/2018Creatinine 2.32, BUN 48, Potassium 3.5, Sodium 135, EGFR 30-35  09/10/2016 Creatinine 1.36, BUN 20, Potassium 4.5, Sodium 139, EGFR 57->60 09/03/2016 Creatinine 1.39, BUN 34, Potassium 4.8, Sodium 136, EGFR 56->60  08/06/2016 Creatinine 1.41, BUN 21, Potassium 4.7, Sodium 131, EGFR 55->60  06/24/2016 Creatinine 1.16, BUN 27, Potassium 3.7, Sodium 137, EGFR >60  Recommendations: NONE - Unable to reach.  Follow-up plan: ICM clinic phone appointment on 12/08/2017.  Office appointment scheduled 11/04/2017 with HF Clinic NP/PA and 11/05/2017 with Tommye Standard,  PA.  Copy of ICM check sent to Dr. Rayann Heman.   3 month ICM trend: 10/29/2017    1 Year ICM trend:       [name], RN 10/29/2017 11:56 AM

## 2017-10-29 NOTE — Progress Notes (Signed)
Patient returned call.  He stated he is feeling well and does not have any fluid symptoms.  He has upcoming appointments with HF clinic and Francis Dowse PA. Transmission reviewed and  no changes today.  Encouraged to call for fluid symptoms.  Next transmission 12/08/17.

## 2017-10-29 NOTE — Telephone Encounter (Signed)
Remote ICM transmission received.  Attempted call to patient and left message to return call. 

## 2017-11-01 NOTE — Progress Notes (Signed)
Cardiology Office Note Date:  11/05/2017  Patient ID:  Gary Hutchinson, DOB [birth year], MRN [id number] PCP:  Creola Corn, MD  Cardiologist:  Dr. Gala Romney Electrophysiologist: Dr. [name]    Chief Complaint: 6 mo EP/device visit  History of Present Illness: Gary Hutchinson is a 57 y.o. male with history of NICM, chronic CHF (systolic), ICD, DM, HTN, OSA w/CPAP, VT, AFib, hx of amio thyrotoxicity managed with his PMD on methimazole, morbid obesity.  He comes today to be seen for Dr. [name], last seen by him in March, at that time doing well, mentioning given his severe LA enlargement, not enthusiastic about an ablation for his AFib, planned for 6 month visit.  He follows closely with CHF team and L. Short Charity fundraiser.  He was seen by myself in Sept 2018 was feeling well, has been doing some traveling, [place] and [place] eating unusually, and late, has put on some weight, though did not feel bloated or like he is retaining water.  He denied any kind of CP, palpitations, or SOB, no rest SOB or exertional intolerances, he walks (when not on vacation) every day for exercise without changes to his ability to do so, no symptoms of PND or orthopnea were reported.  He hasd not felt like he has had any AF.  No dizziness, near syncope or syncope.  He reported his thyroid had leveled off and was taken off the methimazole. No changes were made at that visit, mentioned checking a dig level though I don't see a result.  09/10/17 was discharged from [facility] after being shocked for AFlutter w/1:1 conduction, started on Sotalol 80mg  BID.  He saw D. Noralyn Pick, NP in the AFib clinic in follow up, was doing well, had a couple AFib episodes noted by device check, though was in SR at her visit and maintained on the sotalol without symptoms, and noted QTc .  Labs that visit noted stable electrolytes and renal function.  He is feeling well.  Has not appreciated any symptoms of AFib, no palpitations.  He is walking daily for  exercise usually in the mall/stores and feels like his exertional capacity is pretty good, no overt DOE.  He denies any symptoms of PND or orthopnea and reports using his CPA every night.  He denies any CP or rest SOB.  He does not feel like he is retaining any water, has gained a few pounds though admits to dietary indiscretions over the holidays.  He is back at it with a more overall healthy diet and minding his sodium intake as well.  Since his hospital stay he has only used the metolazone PRN once.  He denies any bleeding or signs of bleeding.  He forgot his Sotalol once only, fell asleep on te sofa without taking his PM dose, can not recall what day specifically, otherwise says he has been very good taking his medicines.   Device information MDT ICD implanted 2009, upgrade to CRT-D 11/16/15, Dr. [name]  + inappropriate shock for rapid Aflutter (atypical) in the setting of illness w/flu Dec 2018, inappropriate shock for 1:1 AFlutter AAD:  amiodarone developed thyrotoxicity Dec 2018 started on Sotalol  Past Medical History:  Diagnosis Date  . AICD (automatic cardioverter/defibrillator) present    Medtronic  . Alcohol abuse    now quit  . Anemia    iron defi  . Arthritis    "fingers, knees, some days all my joints ache" (11/16/2015)  . Asthma   . Cholecystitis   . Chronic  systolic congestive heart failure (HCC)    a. RHC (02/2011) RA 31/27, RV 71/27, PA67/45, PCWP 46, PA 49%, Fick CO: 3.4 b. ECHO (03/2014) EF 30-35%, grade II DD, mild MR, RV poorly visualized appears mildly decreased c. RHC (05/2014) RA 13, RV 54/4/11, PA 60/22 (37), PCWP 17, Fick CO/CI: 4.4 / 1.9, Thermo CO/CI: 3.8 /1.6, PVR 5.2 WU, PA 57% and 59%  . Diverticulosis of colon   . Gout   . Hemorrhoids, internal   . Hyperlipidemia   . Hypertension   . Morbid obesity (HCC)   . Nonischemic cardiomyopathy (HCC)    a. LHC (02/2011) normal coronaries  . OSA on CPAP   . Paroxysmal atrial fibrillation (HCC)   . Pneumonia 2000s X  1  . Pulmonary hypertension (HCC)   . Renal insufficiency   . Type II diabetes mellitus (HCC)   . Ventricular tachycardia [facility])    s/p MDT ICD implant    Past Surgical History:  Procedure Laterality Date  . CARDIAC CATHETERIZATION  03/08/2004   EF 25-30%  . CARDIOVERSION N/A 10/17/2015   Procedure: CARDIOVERSION;  Surgeon: [name], MD;  Location: [facility] ENDOSCOPY;  Service: Cardiovascular;  Laterality: N/A;  . EP IMPLANTABLE DEVICE N/A 11/16/2015   Procedure: BiVI Upgrade;  Surgeon: Hillis Range, MD;  Medtronic [address] [place]  . INSERTION OF ICD  06/2008   ICD- Medtronic   . RIGHT HEART CATHETERIZATION N/A 05/23/2014   Procedure: RIGHT HEART CATH;  Surgeon: [name], MD;  Location[facility] C[facility] LAB;  Service: Cardiovascular;  Laterality: N/A;  . TRANSTHORACIC ECHOCARDIOGRAM  05/27/2008   EF 30-35%  . US ECHOCARDIOGRAPHY  08/22/2008   EF 30-35%    Current Outpatient Medications  Medication Sig Dispense Refill  . acetaminophen (TYLENOL) 325 MG tablet Take 325-650 mg by mouth every 6 (six) hours as needed (for pain). Reported on 10/11/2015    . albuterol (PROAIR HFA) 108 (90 BASE) MCG/ACT inhaler Inhale 2 puffs into the lungs every 6 (six) hours as needed for wheezing or shortness of breath. Reported on 02/06/2016    . allopurinol (ZYLOPRIM) 300 MG tablet Take 300 mg by mouth daily.    Marland Kitchen atorvastatin (LIPITOR) 40 MG tablet TAKE 1 TABLET (40 MG TOTAL) BY MOUTH DAILY. 90 tablet 2  . colchicine 0.6 MG tablet Take 0.6 mg by mouth daily as needed (for gout flares). Reported on 02/06/2016    . digoxin (LANOXIN) 0.125 MG tablet Take 0.5 tablets (0.0625 mg total) by mouth every other day. 45 tablet 3  . digoxin (LANOXIN) 0.25 MG tablet TAKE 1/2 TABLET EVERY OTHER DAY 15 tablet 4  . Fluticasone-Salmeterol (ADVAIR DISKUS) 250-50 MCG/DOSE AEPB Inhale 1 puff into the lungs every 12 (twelve) hours as needed (for flares/shortness of breath). Reported on 02/06/2016    . hydrALAZINE (APRESOLINE) 25 MG  tablet Take 1.5 tablets (37.5 mg total) by mouth 3 (three) times daily. 405 tablet 3  . insulin detemir (LEVEMIR) 100 UNIT/ML injection Inject 36 Units into the skin at bedtime.     . isosorbide mononitrate (IMDUR) 30 MG 24 hr tablet TAKE 1 TABLET (30 MG TOTAL) BY MOUTH 2 TIMES DAILY. (Patient taking differently: Take 30 mg by mouth 2 (two) times daily. ) 180 tablet 3  . linagliptin (TRADJENTA) 5 MG TABS tablet Take 1 tablet (5 mg total) by mouth daily. 30 tablet 3  . metolazone (ZAROXOLYN) 2.5 MG tablet Take 2.5 mg by mouth daily as needed (for edema). Reported on 02/06/2016    .  montelukast (SINGULAIR) 10 MG tablet Take 10 mg by mouth daily as needed (for shortness of breath, wheezing, or flares).     . rivaroxaban (XARELTO) 20 MG TABS tablet Take 1 tablet (20 mg total) by mouth daily with supper. 90 tablet 3  . sacubitril-valsartan (ENTRESTO) 49-51 MG Take 1 tablet by mouth 2 (two) times daily. 180 tablet 3  . sotalol (BETAPACE) 80 MG tablet Take 1 tablet (80 mg total) by mouth 2 (two) times daily. 60 tablet 6  . spironolactone (ALDACTONE) 25 MG tablet Take 12.5 mg by mouth daily.     Marland Kitchen torsemide (DEMADEX) 20 MG tablet Take 20-40 mg by mouth See admin instructions. 40 mg in the morning and 20 mg in the evening     No current facility-administered medications for this visit.     Allergies:   Patient has no known allergies.   Social History:  The patient  reports that  has never smoked. he has never used smokeless tobacco. He reports that he drinks alcohol. He reports that he does not use drugs.   Family History:  The patient's family history includes CAD in his unknown relative; Cancer in his father; Diabetes in his unknown relative; Hypertension in his mother.  ROS:  Please see the history of present illness. All other systems are reviewed and otherwise negative.   PHYSICAL EXAM:  VS:  BP (!) 112/58   Pulse 87   Ht 5\' 5"  (1.651 m)   Wt (!) 304 lb (137.9 kg)   BMI 50.59 kg/m  BMI: Body  mass index is 50.59 kg/m. Well nourished, well developed, in no acute distress  HEENT: normocephalic, atraumatic  Neck: no JVD, obese neck, carotid bruits or masses Cardiac:  RRR; no significant murmurs, no rubs, or gallops Lungs: CTA b/l, no wheezing, rhonchi or rales  Abd: soft, nontender, obese MS: no deformity or atrophy Ext: no edema  Skin: warm and dry, no rash Neuro:  No gross deficits appreciated Psych: euthymic mood, full affect   ICD site is stable, no tethering or discomfort   EKG: done today and reviewed by myself: SR 70bpm, QTc ICD interrogation done today and reviewed by myself:battery and lead measurements are good, 97.3% BiVe paced, OptiVol well below threshold.  05/03/12: EF 25-30%.  Grade 1 diastolic dysfunction.  Mild MR.  Mod dilated LA.   07/27/13: EF 40%, diff HK, MR mild, LA mild/mod dilated  03/2014: EF30-35%, grade II DD, mild MR, RV systolic fx mildly decreased 10/2014: EF 40%.  10/2015: EF ~40%. RV mildly dilated.  8/17 EF 25-35%  Recent Labs: 12/03/2016: ALT 22; TSH 2.187 09/09/2017: Hemoglobin 11.3; Platelets 186 09/10/2017: B Natriuretic Peptide 186.7 09/16/2017: BUN 33; Creatinine, Ser 1.48; Magnesium 2.3; Potassium 4.7; Sodium 137  No results found for requested labs within last 8760 hours.   CrCl cannot be calculated (Patient's most recent lab result is older than the maximum 21 days allowed.).   Wt Readings from Last 3 Encounters:  11/05/17 (!) 304 lb (137.9 kg)  09/16/17 297 lb 12.8 oz (135.1 kg)  09/04/17 300 lb (136.1 kg)     Other studies reviewed: Additional studies/records reviewed today include: summarized above  ASSESSMENT AND PLAN:  1. CRT-D     Intact function, no changes made  2. Chronic CHF (systolic), NICM     Weight is up, though no symptoms or exam findings to suggest fluid OL,  OptiVol looks good     Follows with CHF team and L. Short, RN  3. Paroxysmal AFib     CHA2DS2Vasc is at least 3, on xarelto,  appropriately dosed (last Cr. Cl = 109)     Sotalol with stable QTc    3 AMS episodes in the last month, one 22 hours (1.9% burden) rate controlled (60's)    No change yet, will have him see AFib clinic in 72mo and assess AF burden    Today is an off day for his dig, will get a dig level today, there are 2 strengths listed on his med list, he will call with the correct one, was discharged on 0.125mg  QOD.  4. HTN     Looks good, no changes  5. Obesity     Discussed importance of weight loss, he remains motivated, back at home and on his usual regime, walking regularly   Disposition: as above, continue Q 3 month remotes, ICM clinic follow up, he will see AFib clinic in 3 mo, and plan for in-clinic EP in 1 year, sooner if needed.  He also follows with Dr. Gala Romney   Current medicines are reviewed at length with the patient today.  The patient did not have any concerns regarding medicines.  Norma Fredrickson, [name] 11/05/2017 2:07 PM     [facility] [address] [place] [address] [phone] (office)  [phone] (fax)

## 2017-11-04 ENCOUNTER — Inpatient Hospital Stay (HOSPITAL_COMMUNITY): Admission: RE | Admit: 2017-11-04 | Payer: Medicare Other | Source: Ambulatory Visit

## 2017-11-05 ENCOUNTER — Ambulatory Visit (INDEPENDENT_AMBULATORY_CARE_PROVIDER_SITE_OTHER): Payer: Medicare Other | Admitting: Physician Assistant

## 2017-11-05 VITALS — BP 112/58 | HR 87 | Ht 65.0 in | Wt 304.0 lb

## 2017-11-05 DIAGNOSIS — I4891 Unspecified atrial fibrillation: Secondary | ICD-10-CM

## 2017-11-05 DIAGNOSIS — I10 Essential (primary) hypertension: Secondary | ICD-10-CM

## 2017-11-05 DIAGNOSIS — I480 Paroxysmal atrial fibrillation: Secondary | ICD-10-CM

## 2017-11-05 DIAGNOSIS — I5022 Chronic systolic (congestive) heart failure: Secondary | ICD-10-CM

## 2017-11-05 DIAGNOSIS — I428 Other cardiomyopathies: Secondary | ICD-10-CM

## 2017-11-05 DIAGNOSIS — Z79899 Other long term (current) drug therapy: Secondary | ICD-10-CM | POA: Diagnosis not present

## 2017-11-05 DIAGNOSIS — Z95810 Presence of automatic (implantable) cardiac defibrillator: Secondary | ICD-10-CM

## 2017-11-05 NOTE — Patient Instructions (Addendum)
Medication Instructions:   Your physician recommends that you continue on your current medications as directed. Please refer to the Current Medication list given to you today.    If you need a refill on your cardiac medications before your next appointment, please call your pharmacy.  Labwork: DIG LEVEL  TODAY    Testing/Procedures:  NONE ORDERED  TODAY    Follow-Up:   IN 3 MONTHS WITH AFIB CLINIC   Your physician wants you to follow-up in: ONE YEAR WITH  [facility] [place]   You will receive a reminder letter in the mail two months in advance. If you don't receive a letter, please call our office to schedule the follow-up appointment.    Remote monitoring is used to monitor your Pacemaker of ICD from home. This monitoring reduces the number of office visits required to check your device to one time per year. It allows [place] to keep an eye on the functioning of your device to ensure it is working properly. You are scheduled for a device check from home on . 01-14-18.You may send your transmission at any time that day. If you have a wireless device, the transmission will be sent automatically. After your physician reviews your transmission, you will receive a postcard with your next transmission date.     Any Other Special Instructions Will Be Listed Below (If Applicable).

## 2017-11-06 LAB — DIGOXIN LEVEL: DIGOXIN, SERUM: 0.4 ng/mL — AB (ref 0.5–0.9)

## 2017-11-06 NOTE — Addendum Note (Signed)
Addended by: Oleta Mouse on: 11/06/2017 12:34 PM   Modules accepted: Orders

## 2017-11-09 ENCOUNTER — Other Ambulatory Visit (HOSPITAL_COMMUNITY): Payer: Self-pay | Admitting: Internal Medicine

## 2017-11-09 ENCOUNTER — Telehealth: Payer: Self-pay | Admitting: *Deleted

## 2017-11-09 NOTE — Telephone Encounter (Signed)
LMOVM OF RESULTS AND CLINIC CONTACT NUMBER TO CALL BACK IF  HAVE ANY QUESTIONS.

## 2017-11-09 NOTE — Telephone Encounter (Signed)
-----   Message from [facility], [place] sent at 11/06/2017  5:09 PM EST ----- Let the patient know his dig level is a little low, though no changes for now, continue every other day, same dose.  Thanks renee

## 2017-11-12 ENCOUNTER — Other Ambulatory Visit: Payer: Self-pay | Admitting: Internal Medicine

## 2017-11-17 LAB — CUP PACEART REMOTE DEVICE CHECK
Battery Voltage: 2.99 V
Brady Statistic AP VP Percent: 2.85 %
Brady Statistic AP VS Percent: 0.07 %
Brady Statistic AS VP Percent: 95.34 %
Brady Statistic AS VS Percent: 1.74 %
Brady Statistic RA Percent Paced: 2.9 %
Date Time Interrogation Session: 20190110113424
HIGH POWER IMPEDANCE MEASURED VALUE: 75 Ohm
Implantable Lead Implant Date: 20090901
Implantable Lead Implant Date: 20170210
Implantable Lead Implant Date: 20170210
Implantable Lead Location: 753858
Implantable Lead Model: 6935
Lead Channel Impedance Value: 361 Ohm
Lead Channel Impedance Value: 399 Ohm
Lead Channel Impedance Value: 399 Ohm
Lead Channel Impedance Value: 418 Ohm
Lead Channel Impedance Value: 456 Ohm
Lead Channel Impedance Value: 665 Ohm
Lead Channel Impedance Value: 760 Ohm
Lead Channel Impedance Value: 817 Ohm
Lead Channel Impedance Value: 874 Ohm
Lead Channel Pacing Threshold Amplitude: 0.625 V
Lead Channel Pacing Threshold Amplitude: 0.875 V
Lead Channel Pacing Threshold Pulse Width: 0.4 ms
Lead Channel Pacing Threshold Pulse Width: 0.4 ms
Lead Channel Pacing Threshold Pulse Width: 0.4 ms
Lead Channel Sensing Intrinsic Amplitude: 2.625 mV
Lead Channel Sensing Intrinsic Amplitude: 9.25 mV
Lead Channel Sensing Intrinsic Amplitude: 9.25 mV
Lead Channel Setting Pacing Amplitude: 1.5 V
Lead Channel Setting Pacing Amplitude: 2 V
Lead Channel Setting Pacing Pulse Width: 0.4 ms
Lead Channel Setting Sensing Sensitivity: 0.3 mV
MDC IDC LEAD LOCATION: 753859
MDC IDC LEAD LOCATION: 753860
MDC IDC MSMT BATTERY REMAINING LONGEVITY: 77 mo
MDC IDC MSMT LEADCHNL LV IMPEDANCE VALUE: 513 Ohm
MDC IDC MSMT LEADCHNL LV IMPEDANCE VALUE: 513 Ohm
MDC IDC MSMT LEADCHNL LV IMPEDANCE VALUE: 836 Ohm
MDC IDC MSMT LEADCHNL RA SENSING INTR AMPL: 2.625 mV
MDC IDC MSMT LEADCHNL RV IMPEDANCE VALUE: 342 Ohm
MDC IDC MSMT LEADCHNL RV PACING THRESHOLD AMPLITUDE: 0.75 V
MDC IDC PG IMPLANT DT: 20170210
MDC IDC SET LEADCHNL RV PACING AMPLITUDE: 2 V
MDC IDC SET LEADCHNL RV PACING PULSEWIDTH: 0.4 ms
MDC IDC STAT BRADY RV PERCENT PACED: 2.42 %

## 2017-11-22 ENCOUNTER — Other Ambulatory Visit (HOSPITAL_COMMUNITY): Payer: Self-pay | Admitting: Internal Medicine

## 2017-12-08 ENCOUNTER — Ambulatory Visit (INDEPENDENT_AMBULATORY_CARE_PROVIDER_SITE_OTHER): Payer: Medicare Other

## 2017-12-08 DIAGNOSIS — I5022 Chronic systolic (congestive) heart failure: Secondary | ICD-10-CM

## 2017-12-08 DIAGNOSIS — Z95810 Presence of automatic (implantable) cardiac defibrillator: Secondary | ICD-10-CM | POA: Diagnosis not present

## 2017-12-08 NOTE — Progress Notes (Signed)
EPIC Encounter for ICM Monitoring  Patient Name: Gary Hutchinson is a 57 y.o. male Date: 12/08/2017 Primary Care Physican: Shon Baton, MD Primary Cardiologist:[name] Electrophysiologist: Allred Dry Weight: 298lbs Bi-V Pacing: 94.9%     Clinical Status (05-Nov-2017 to 08-Dec-2017) Time in AT/AF 2.0 hr/day (8.2%)  Longest AT/AF 3 days  Observations (1) (05-Nov-2017 to 08-Dec-2017)  AT/AF >= 1 hr for 3 days.       Heart Failure questions reviewed, pt asymptomatic.     Thoracic impedance abnormal suggesting fluid accumulation .  Prescribed dosage: Torsemide 20 mg 2 tablets (40 mg total) in AMand 1 tablet (20 mg total) every PM. Metolazone 2.5 mg 1 tablet as needed.  Labs: 09/16/2017 Creatinine1.48, BUN33, Potassium4.7, [id number], [id number] 09/09/2017 Creatinine1.77, BUN52, Potassium3.8, [id number], [id number]  09/04/2017 Creatinine2.15, BUN75, Potassium3.9, Sodium136, [id number]  10/22/2018Creatinine 1.60, BUN46, Potassium3.6, [id number], [id number] 04/27/2017 Creatinine 1.82, BUN 47, Potassium 4.2, Sodium 137, EGFR 40-46 04/13/2017 Creatinine 1.60, BUN 42, Potassium 3.6, Sodium 138, EGFR 47-54 12/29/2016 Creatinine 1.16, BUN 21, Potassium 4.1, Sodium 139, EGFR >60 12/03/2016 Creatinine 1.54, BUN 34, Potassium 4.0, Sodium 136, EGFR 49-57  11/27/2016 Creatinine 1.38, BUN 26, Potassium 3.8, Sodium 138, EGFR 56->60  11/26/2016 Creatinine 1.97, BUN 44, Potassium 3.5, Sodium 138, EGFR 36-42  02/20/2018Creatinine 2.32, BUN 48, Potassium 3.5, Sodium 135, EGFR 30-35  09/10/2016 Creatinine 1.36, BUN 20, Potassium 4.5, Sodium 139, EGFR 57->60 09/03/2016 Creatinine 1.39, BUN 34, Potassium 4.8, Sodium 136, EGFR 56->60  08/06/2016 Creatinine 1.41, BUN 21, Potassium 4.7, Sodium 131, EGFR 55->60  06/24/2016 Creatinine 1.16, BUN 27, Potassium 3.7, Sodium 137, EGFR >60  Recommendations: No changes.  Patient takes Metolazone when needed.  Encouraged to limit salt  intake.  Encouraged to call for fluid symptoms.  Follow-up plan: ICM clinic phone appointment on 01/14/2018.  Office appointment scheduled 02/01/2018 with Dr. Rayann Heman.  Copy of ICM check sent to Dr. Rayann Heman and Dr. Haroldine Laws.   3 month ICM trend: 12/08/2017    AT/AF   1 Year ICM trend:       [name], RN 12/08/2017 11:33 AM

## 2017-12-11 ENCOUNTER — Other Ambulatory Visit (HOSPITAL_COMMUNITY): Payer: Self-pay | Admitting: *Deleted

## 2017-12-11 MED ORDER — DIGOXIN 250 MCG PO TABS: ORAL_TABLET | ORAL | 3 refills | Status: DC

## 2017-12-27 ENCOUNTER — Other Ambulatory Visit (HOSPITAL_COMMUNITY): Payer: Self-pay | Admitting: Internal Medicine

## 2018-01-14 ENCOUNTER — Ambulatory Visit (INDEPENDENT_AMBULATORY_CARE_PROVIDER_SITE_OTHER): Payer: Medicare Other | Admitting: *Deleted

## 2018-01-14 DIAGNOSIS — I5022 Chronic systolic (congestive) heart failure: Secondary | ICD-10-CM

## 2018-01-14 DIAGNOSIS — Z95810 Presence of automatic (implantable) cardiac defibrillator: Secondary | ICD-10-CM | POA: Diagnosis not present

## 2018-01-14 DIAGNOSIS — I428 Other cardiomyopathies: Secondary | ICD-10-CM | POA: Diagnosis not present

## 2018-01-14 NOTE — Progress Notes (Signed)
EPIC Encounter for ICM Monitoring  Patient Name: Gary Hutchinson is a 57 y.o. male Date: 01/14/2018 Primary Care Physican: Shon Baton, MD Primary Cardiologist:[name] Electrophysiologist: Allred Dry Weight:297lbs Bi-V Pacing: 91.9%  Clinical Status (08-Dec-2017 to 14-Jan-2018)  Treated VT/VF 0 episodes   AT/AF 1 episode   Time in AT/AF 4.3 hr/day (18.0%)  Observations (1) (08-Dec-2017  AT/AF >= 1 hr for 8 days.       Heart Failure questions reviewed, pt reported weight gain of a few pounds.  Patient has been eating late at night and foods that are high in salt.     Thoracic impedance abnormal suggesting fluid accumulation since 12/24/2017 with one day at baseline.  Prescribed dosage: Torsemide 20 mg 2 tablets (40 mg total) in AMand 1 tablet (20 mg total) every PM. Metolazone 2.5 mg 1 tablet as needed.  Labs: 09/16/2017 Creatinine1.48, BUN33, Potassium4.7, [id number], [id number] 09/09/2017 Creatinine1.77, BUN52, Potassium3.8, [id number], [id number]  09/04/2017 Creatinine2.15, BUN75, Potassium3.9, Sodium136, [id number]  10/22/2018Creatinine 1.60, BUN46, Potassium3.6, [id number], [id number] 04/27/2017 Creatinine 1.82, BUN 47, Potassium 4.2, Sodium 137, EGFR 40-46 04/13/2017 Creatinine 1.60, BUN 42, Potassium 3.6, Sodium 138, EGFR 47-54 12/29/2016 Creatinine 1.16, BUN 21, Potassium 4.1, Sodium 139, EGFR >60 12/03/2016 Creatinine 1.54, BUN 34, Potassium 4.0, Sodium 136, EGFR 49-57  11/27/2016 Creatinine 1.38, BUN 26, Potassium 3.8, Sodium 138, EGFR 56->60  11/26/2016 Creatinine 1.97, BUN 44, Potassium 3.5, Sodium 138, EGFR 36-42  02/20/2018Creatinine 2.32, BUN 48, Potassium 3.5, Sodium 135, EGFR 30-35  09/10/2016 Creatinine 1.36, BUN 20, Potassium 4.5, Sodium 139, EGFR 57->60 09/03/2016 Creatinine 1.39, BUN 34, Potassium 4.8, Sodium 136, EGFR 56->60  08/06/2016 Creatinine 1.41, BUN 21, Potassium 4.7, Sodium 131, EGFR 55->60  06/24/2016 Creatinine 1.16,  BUN 27, Potassium 3.7, Sodium 137, EGFR >60  Recommendations:  Reinforced fluid restriction to < 2 L daily and sodium restriction to less than 2000 mg daily.  He will take a Metolazone for weight gain.   Encouraged to call for fluid symptoms.  Follow-up plan: ICM clinic phone appointment on 01/28/2018 to recheck fluid levels.  Office appointment scheduled 02/01/2018 with Dr. Rayann Heman.  Copy of ICM check sent to Dr. Rayann Heman and Dr. Haroldine Laws.   3 month ICM trend: 01/14/2018    AT/AF   1 Year ICM trend:       [name], RN 01/14/2018 2:56 PM

## 2018-01-14 NOTE — Progress Notes (Signed)
Remote ICD transmission.   

## 2018-01-19 NOTE — Progress Notes (Signed)
Patient ID: Gary Hutchinson, male   DOB: [birth year], 57 y.o.   MRN: [id number]    Advanced Heart Failure Clinic Note   PCP: Creola Corn EP: Hillis Range  HPI: Gary Hutchinson is a 57 y.o. male with a history of chronic systolic heart failure, ICM s/p ICD, HTN, DM II, OSA on CPAP, history of ventricular tachycardia status post AICD placement in 2009, atrial fibrillation and morbid obesity.   Admitted 6/1-03/13/14 for A/C HF and cardiogenic shock (co-ox 42%). Diuresed with IV lasix and milrinone which were weaned off.  He went into Afib with RVR and chemically cardioverted back to NSR with IV amiodarone. Placed on Xarelto. BB restarted 1/2 dose and held ACE-I. His blood sugars were elevated along with Hgb A1C and started on insulin. Discharge weight 344 lbs.   RHC (05/2014): Left sided filling pressures well compensated, mild pulm HTN with elevated right-sided pressures and mod/severely depressed CO. Discussion about trial of milrinone but patient wanted to hold off.  Admitted January 2017 with volume overload and cellulitis. Diuresed with IV lasix and required short term milrinone. Loaded on amio and had successful DC-CV on  10/17/2015.  Discharge weight was 307 pounds.   Admitted 2/18 with Influenza A/B. 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 Seen by EP and managed conservatively. Received IVF in hospital for sepsis but not diuresed back down afterward. Hydralazine and Imdur also stopped.   S/p successful DCCV 07/29/2017.  Pt presents today for follow up.  Last seen in office 08/2017 post DCCV.  Optivol last week showed Afib and fluid accumulation. Optivol today shows he remains in Afib, but his fluid has normalized with metolazone last week. He has been feeling good overall. He denies palpitations, CP, or orthopnea. Taking all medications as directed. He is trying to watch his salt and fluid but lapsed recently due to his birthday celebrations. He is frustrated to be back in  Afib.   EKG V paced 63 bpm  Optivol: Personally interrogated. Fluid index back down to normal. Thoracic impedence trending towards dry. No VT/VF. He has been in AF since April 9th. Activity 1-2 hrs daily.    05/03/12: EF 25-30%.  Grade 1 diastolic dysfunction.  Mild MR.  Mod dilated LA.   07/27/13: EF 40%, diff HK, MR mild, LA mild/mod dilated  03/2014: EF30-35%, grade II DD, mild MR, RV systolic fx mildly decreased 10/2014: EF 40%.  10/2015: EF ~40%. RV mildly dilated.  8/17 EF 25-35% 07/27/2017 EF  25- 30%   SH: Lives with son in [place]. Disabled. No ETOH or tobacco abuse  FH; Mother living: HT        Father deceased: was not part of his life so not sure health issues  Review of systems complete and found to be negative unless listed in HPI.    Past Medical History:  Diagnosis Date  . AICD (automatic cardioverter/defibrillator) present    Medtronic  . Alcohol abuse    now quit  . Anemia    iron defi  . Arthritis    "fingers, knees, some days all my joints ache" (11/16/2015)  . Asthma   . Cholecystitis   . Chronic systolic congestive heart failure (HCC)    a. RHC (02/2011) RA 31/27, RV 71/27, PA67/45, PCWP 46, PA 49%, Fick CO: 3.4 b. ECHO (03/2014) EF 30-35%, grade II DD, mild MR, RV poorly visualized appears mildly decreased c. RHC (05/2014) RA 13, RV 54/4/11, PA 60/22 (37), PCWP 17, Fick  CO/CI: 4.4 / 1.9, Thermo CO/CI: 3.8 /1.6, PVR 5.2 WU, PA 57% and 59%  . Diverticulosis of colon   . Gout   . Hemorrhoids, internal   . Hyperlipidemia   . Hypertension   . Morbid obesity (HCC)   . Nonischemic cardiomyopathy (HCC)    a. LHC (02/2011) normal coronaries  . OSA on CPAP   . Paroxysmal atrial fibrillation (HCC)   . Pneumonia 2000s X 1  . Pulmonary hypertension (HCC)   . Renal insufficiency   . Type II diabetes mellitus (HCC)   . Ventricular tachycardia [facility])    s/p MDT ICD implant    Current Outpatient Medications  Medication Sig Dispense Refill  . acetaminophen  (TYLENOL) 325 MG tablet Take 325-650 mg by mouth every 6 (six) hours as needed (for pain). Reported on 10/11/2015    . albuterol (PROAIR HFA) 108 (90 BASE) MCG/ACT inhaler Inhale 2 puffs into the lungs every 6 (six) hours as needed for wheezing or shortness of breath. Reported on 02/06/2016    . allopurinol (ZYLOPRIM) 300 MG tablet Take 300 mg by mouth daily.    Marland Kitchen atorvastatin (LIPITOR) 40 MG tablet TAKE 1 TABLET (40 MG TOTAL) BY MOUTH DAILY. 90 tablet 2  . colchicine 0.6 MG tablet Take 0.6 mg by mouth daily as needed (for gout flares). Reported on 02/06/2016    . digoxin (LANOXIN) 0.25 MG tablet TAKE 1/2 TABLET EVERY OTHER DAY 45 tablet 3  . Fluticasone-Salmeterol (ADVAIR DISKUS) 250-50 MCG/DOSE AEPB Inhale 1 puff into the lungs every 12 (twelve) hours as needed (for flares/shortness of breath). Reported on 02/06/2016    . hydrALAZINE (APRESOLINE) 25 MG tablet Take 1.5 tablets (37.5 mg total) by mouth 3 (three) times daily. 405 tablet 3  . insulin detemir (LEVEMIR) 100 UNIT/ML injection Inject 36 Units into the skin at bedtime.     . isosorbide mononitrate (IMDUR) 30 MG 24 hr tablet TAKE 1 TABLET (30 MG TOTAL) BY MOUTH 2 TIMES DAILY. (Patient taking differently: Take 30 mg by mouth 2 (two) times daily. ) 180 tablet 3  . linagliptin (TRADJENTA) 5 MG TABS tablet Take 1 tablet (5 mg total) by mouth daily. 30 tablet 3  . metolazone (ZAROXOLYN) 2.5 MG tablet Take 2.5 mg by mouth daily as needed (for edema). Reported on 02/06/2016    . montelukast (SINGULAIR) 10 MG tablet Take 10 mg by mouth daily as needed (for shortness of breath, wheezing, or flares).     . sacubitril-valsartan (ENTRESTO) 49-51 MG Take 1 tablet by mouth 2 (two) times daily. 180 tablet 3  . sotalol (BETAPACE) 80 MG tablet Take 1 tablet (80 mg total) by mouth 2 (two) times daily. 60 tablet 6  . spironolactone (ALDACTONE) 25 MG tablet Take 12.5 mg by mouth daily.     Marland Kitchen torsemide (DEMADEX) 20 MG tablet Take 2 tablets (40 mg total) by mouth 2 (two)  times daily. 120 tablet 3  . XARELTO 20 MG TABS tablet TAKE 1 TABLET (20 MG TOTAL) BY MOUTH DAILY WITH SUPPER. 90 tablet 2   No current facility-administered medications for this visit.    Vitals:   01/20/18 0943  BP: 114/62  Pulse: 87  SpO2: 98%  Weight: 299 lb 3.2 oz (135.7 kg)    Wt Readings from Last 3 Encounters:  01/20/18 299 lb 3.2 oz (135.7 kg)  11/05/17 (!) 304 lb (137.9 kg)  09/16/17 297 lb 12.8 oz (135.1 kg)    PHYSICAL EXAM: General: Well appearing. No resp difficulty.  HEENT: Normal Neck: Supple. JVP 6-7 cm. Carotids 2+ bilat; no bruits. No thyromegaly or nodule noted. Cor: PMI nondisplaced. Irregularly irregular, No M/G/R noted Lungs: CTAB, normal effort. Abdomen: Soft, non-tender, non-distended, no HSM. No bruits or masses. +BS  Extremities: No cyanosis, clubbing, or rash. R and LLE no edema.  Neuro: Alert & orientedx3, cranial nerves grossly intact. moves all 4 extremities w/o difficulty. Affect pleasant   ASSESSMENT & PLAN:   1) Chronic systolic HF: NICM s/p BiV Medtronic, EF 25% 06/03/2016.   - NYHA 2 symptoms - Volume status stable on exam.   - Continue torsemide 40 mg BId with metolazone AS NEEDED.  - Continue entresto 49-51 twice a day. No room to increase today with soft BP. BMET today.   - Continue bisoprolol 5 mg daily. Dig level today.  - Continue digoxin 0.0625 mg every other day. - Continue hydralazine 37.5 mg TID - Continue imdur 30 mg BID.  - Reinforced fluid restriction to < 2 L daily, sodium restriction to less than 2000 mg daily, and the importance of daily weights.   2) CKD stage III - Followed by nephrology.  - BMET today.  3) HTN  - Meds as above.  4) OSA  - Continue nightly CPAP.  No change. 5) PAF/AFL-  S/P multiple DCCVs. Most recent 07/29/17  - Had episode of AFL with ICD shock in 2/18 at time of influenza - EKG today shows BiV paced 63 bpm.  - By ICD interrogation he is back in Afib.  - Will plan for DCCV next week. Labs today.  Needs to see EP for ablation/tikosyn consideration.  - Continue xarelto 20 mg daily. Denies s/s bleeding. He states he has been completely compliant  6) Obesity:   - Body mass index is 49.79 kg/m.  - Encouraged weight loss.  7) Amiodarone thyroxicity   - Off Amiodarone. Completed Methimazole treatment per PCP.   - No change.   Plan for DCCV. He has been compliant with Xarelto and Sotalol. Will need EP to see in close follow up for ablation vs tikosyn consideration. He has appt with Dr. [name] with 02/01/18.  Graciella Freer, [name]  01/19/2018 10:17 AM   Greater than 50% of the 30 minute visit was spent in counseling/coordination of care regarding disease state education, salt/fluid restriction, sliding scale diuretics, and medication compliance.

## 2018-01-20 ENCOUNTER — Encounter (HOSPITAL_COMMUNITY): Payer: Self-pay

## 2018-01-20 ENCOUNTER — Encounter: Payer: Self-pay | Admitting: Internal Medicine

## 2018-01-20 ENCOUNTER — Ambulatory Visit (HOSPITAL_COMMUNITY)
Admission: RE | Admit: 2018-01-20 | Discharge: 2018-01-20 | Disposition: A | Discharge status: Home / Self Care | Payer: Medicare Other | Source: Ambulatory Visit | Attending: Internal Medicine | Admitting: Internal Medicine

## 2018-01-20 ENCOUNTER — Encounter: Payer: Self-pay | Admitting: Cardiology

## 2018-01-20 VITALS — BP 114/62 | HR 87 | Wt 299.2 lb

## 2018-01-20 DIAGNOSIS — N183 Chronic kidney disease, stage 3 (moderate): Secondary | ICD-10-CM | POA: Insufficient documentation

## 2018-01-20 DIAGNOSIS — J45909 Unspecified asthma, uncomplicated: Secondary | ICD-10-CM | POA: Insufficient documentation

## 2018-01-20 DIAGNOSIS — I10 Essential (primary) hypertension: Secondary | ICD-10-CM

## 2018-01-20 DIAGNOSIS — G4733 Obstructive sleep apnea (adult) (pediatric): Secondary | ICD-10-CM

## 2018-01-20 DIAGNOSIS — Z79899 Other long term (current) drug therapy: Secondary | ICD-10-CM | POA: Diagnosis not present

## 2018-01-20 DIAGNOSIS — Z794 Long term (current) use of insulin: Secondary | ICD-10-CM | POA: Insufficient documentation

## 2018-01-20 DIAGNOSIS — M109 Gout, unspecified: Secondary | ICD-10-CM | POA: Insufficient documentation

## 2018-01-20 DIAGNOSIS — Z6841 Body Mass Index (BMI) 40.0 and over, adult: Secondary | ICD-10-CM | POA: Insufficient documentation

## 2018-01-20 DIAGNOSIS — M1990 Unspecified osteoarthritis, unspecified site: Secondary | ICD-10-CM | POA: Insufficient documentation

## 2018-01-20 DIAGNOSIS — I480 Paroxysmal atrial fibrillation: Secondary | ICD-10-CM

## 2018-01-20 DIAGNOSIS — I5022 Chronic systolic (congestive) heart failure: Secondary | ICD-10-CM | POA: Diagnosis present

## 2018-01-20 DIAGNOSIS — Z95810 Presence of automatic (implantable) cardiac defibrillator: Secondary | ICD-10-CM | POA: Diagnosis not present

## 2018-01-20 DIAGNOSIS — I130 Hypertensive heart and chronic kidney disease with heart failure and stage 1 through stage 4 chronic kidney disease, or unspecified chronic kidney disease: Secondary | ICD-10-CM | POA: Diagnosis not present

## 2018-01-20 DIAGNOSIS — E1122 Type 2 diabetes mellitus with diabetic chronic kidney disease: Secondary | ICD-10-CM | POA: Insufficient documentation

## 2018-01-20 DIAGNOSIS — I428 Other cardiomyopathies: Secondary | ICD-10-CM | POA: Diagnosis not present

## 2018-01-20 DIAGNOSIS — Z7901 Long term (current) use of anticoagulants: Secondary | ICD-10-CM | POA: Insufficient documentation

## 2018-01-20 DIAGNOSIS — I272 Pulmonary hypertension, unspecified: Secondary | ICD-10-CM | POA: Diagnosis not present

## 2018-01-20 DIAGNOSIS — E785 Hyperlipidemia, unspecified: Secondary | ICD-10-CM | POA: Diagnosis not present

## 2018-01-20 LAB — CBC
HCT: 38.4 % — ABNORMAL LOW (ref 39.0–52.0)
Hemoglobin: 12.2 g/dL — ABNORMAL LOW (ref 13.0–17.0)
MCH: 28.2 pg (ref 26.0–34.0)
MCHC: 31.8 g/dL (ref 30.0–36.0)
MCV: 88.7 fL (ref 78.0–100.0)
PLATELETS: 177 10*3/uL (ref 150–400)
RBC: 4.33 MIL/uL (ref 4.22–5.81)
RDW: 15.4 % (ref 11.5–15.5)
WBC: 7 10*3/uL (ref 4.0–10.5)

## 2018-01-20 LAB — PROTIME-INR
INR: 1.72
PROTHROMBIN TIME: 20 s — AB (ref 11.4–15.2)

## 2018-01-20 LAB — BASIC METABOLIC PANEL
Anion gap: 10 (ref 5–15)
BUN: 56 mg/dL — ABNORMAL HIGH (ref 6–20)
CALCIUM: 9.5 mg/dL (ref 8.9–10.3)
CHLORIDE: 100 mmol/L — AB (ref 101–111)
CO2: 26 mmol/L (ref 22–32)
CREATININE: 1.76 mg/dL — AB (ref 0.61–1.24)
GFR calc Af Amer: 48 mL/min — ABNORMAL LOW (ref >60)
GFR calc non Af Amer: 41 mL/min — ABNORMAL LOW (ref >60)
GLUCOSE: 202 mg/dL — AB (ref 65–99)
Potassium: 4 mmol/L (ref 3.5–5.1)
Sodium: 136 mmol/L (ref 135–145)

## 2018-01-20 LAB — MAGNESIUM: Magnesium: 2.4 mg/dL (ref 1.7–2.4)

## 2018-01-20 NOTE — Patient Instructions (Addendum)
Routine lab work today. Will notify you of abnormal results, otherwise no news is good news!  Will refer you to electrophysiology at [facility]. Address: [address] ([address] Floor), [place], [place] [address]  Phone: [phone]  Follow up 2 weeks with Otilio Saber [name].  Take all medication as prescribed the day of your appointment. Bring all medications with you to your appointment.  Do the following things EVERYDAY: 1) Weigh yourself in the morning before breakfast. Write it down and keep it in a log. 2) Take your medicines as prescribed 3) Eat low salt foods-Limit salt (sodium) to 2000 mg per day.  4) Stay as active as you can everyday 5) Limit all fluids for the day to less than 2 liters

## 2018-01-21 ENCOUNTER — Other Ambulatory Visit (HOSPITAL_COMMUNITY): Payer: Self-pay

## 2018-01-21 DIAGNOSIS — I480 Paroxysmal atrial fibrillation: Secondary | ICD-10-CM

## 2018-01-26 ENCOUNTER — Ambulatory Visit (HOSPITAL_COMMUNITY)
Admission: RE | Admit: 2018-01-26 | Discharge: 2018-01-26 | Disposition: A | Discharge status: Home / Self Care | Payer: Medicare Other | Source: Ambulatory Visit | Attending: Cardiovascular Disease | Admitting: Cardiovascular Disease

## 2018-01-26 ENCOUNTER — Encounter (HOSPITAL_COMMUNITY)
Admission: RE | Disposition: A | Discharge status: Home / Self Care | Payer: Self-pay | Source: Ambulatory Visit | Attending: Cardiovascular Disease

## 2018-01-26 DIAGNOSIS — Z5309 Procedure and treatment not carried out because of other contraindication: Secondary | ICD-10-CM | POA: Insufficient documentation

## 2018-01-26 SURGERY — CANCELLED PROCEDURE

## 2018-01-26 NOTE — Progress Notes (Signed)
Pt showed up for cardioversion today in normal sinus rhythm. Medtronic rep, Apolinar Junes, confirmed rhythm while checking device. Dr. Purvis Sheffield made aware. Pt's cardioversion cancelled and pt sent home. Weston Settle, RN

## 2018-01-28 ENCOUNTER — Ambulatory Visit (INDEPENDENT_AMBULATORY_CARE_PROVIDER_SITE_OTHER): Payer: Self-pay

## 2018-01-28 DIAGNOSIS — I5022 Chronic systolic (congestive) heart failure: Secondary | ICD-10-CM

## 2018-01-28 DIAGNOSIS — Z95810 Presence of automatic (implantable) cardiac defibrillator: Secondary | ICD-10-CM

## 2018-01-29 NOTE — Progress Notes (Signed)
EPIC Encounter for ICM Monitoring  Patient Name: DELOYD HANDY is a 57 y.o. male Date: 01/29/2018 Primary Care Physican: Shon Baton, MD Primary Cardiologist:[name] Electrophysiologist: Allred Dry Weight:297lbs Bi-V Pacing: 96.9%       Heart Failure questions reviewed, pt asymptomatic. Cardioversion canceled on 01/26/2018 due to patient went back NSR.  He is working on changing his diet and understands the importance of making better food choices.   Thoracic impedance normal.  Prescribed dosage: Torsemide 20 mg 2 tablets (40 mg total) in AMand 1 tablet (20 mg total) every PM. Metolazone 2.5 mg 1 tablet as needed.  Labs: 09/16/2017 Creatinine1.48, BUN33, Potassium4.7, [id number], [id number] 09/09/2017 Creatinine1.77, BUN52, Potassium3.8, [id number], [id number]  09/04/2017 Creatinine2.15, BUN75, Potassium3.9, Sodium136, [id number]  10/22/2018Creatinine 1.60, BUN46, Potassium3.6, [id number], [id number] 04/27/2017 Creatinine 1.82, BUN 47, Potassium 4.2, Sodium 137, EGFR 40-46 04/13/2017 Creatinine 1.60, BUN 42, Potassium 3.6, Sodium 138, EGFR 47-54 12/29/2016 Creatinine 1.16, BUN 21, Potassium 4.1, Sodium 139, EGFR >60 12/03/2016 Creatinine 1.54, BUN 34, Potassium 4.0, Sodium 136, EGFR 49-57  11/27/2016 Creatinine 1.38, BUN 26, Potassium 3.8, Sodium 138, EGFR 56->60  11/26/2016 Creatinine 1.97, BUN 44, Potassium 3.5, Sodium 138, EGFR 36-42  02/20/2018Creatinine 2.32, BUN 48, Potassium 3.5, Sodium 135, EGFR 30-35  09/10/2016 Creatinine 1.36, BUN 20, Potassium 4.5, Sodium 139, EGFR 57->60 09/03/2016 Creatinine 1.39, BUN 34, Potassium 4.8, Sodium 136, EGFR 56->60  08/06/2016 Creatinine 1.41, BUN 21, Potassium 4.7, Sodium 131, EGFR 55->60  06/24/2016 Creatinine 1.16, BUN 27, Potassium 3.7, Sodium 137, EGFR >60  Recommendations: No changes.  Discussed low salt diet.  Encouraged to call for fluid symptoms.  Follow-up plan: ICM clinic phone appointment on  03/04/2018.  Office appointment scheduled 02/01/2018 with Dr. Rayann Heman and 02/04/2018 with HF clinic PA/NP.  Copy of ICM check sent to Dr. Rayann Heman.   3 month ICM trend: 01/28/2018    AT/AF   1 Year ICM trend:       [name], RN 01/29/2018 8:48 AM

## 2018-02-01 ENCOUNTER — Ambulatory Visit (INDEPENDENT_AMBULATORY_CARE_PROVIDER_SITE_OTHER): Payer: Medicare Other | Admitting: Internal Medicine

## 2018-02-01 ENCOUNTER — Encounter: Payer: Self-pay | Admitting: Internal Medicine

## 2018-02-01 VITALS — BP 120/66 | HR 61 | Ht 65.0 in | Wt 297.0 lb

## 2018-02-01 DIAGNOSIS — I519 Heart disease, unspecified: Secondary | ICD-10-CM

## 2018-02-01 DIAGNOSIS — G4733 Obstructive sleep apnea (adult) (pediatric): Secondary | ICD-10-CM

## 2018-02-01 DIAGNOSIS — I480 Paroxysmal atrial fibrillation: Secondary | ICD-10-CM | POA: Diagnosis not present

## 2018-02-01 DIAGNOSIS — I428 Other cardiomyopathies: Secondary | ICD-10-CM

## 2018-02-01 LAB — CUP PACEART INCLINIC DEVICE CHECK
Battery Remaining Longevity: 73 mo
Brady Statistic AP VS Percent: 0.07 %
Brady Statistic AS VP Percent: 94.83 %
Brady Statistic AS VS Percent: 2.15 %
Brady Statistic RA Percent Paced: 3 %
HighPow Impedance: 77 Ohm
Implantable Lead Implant Date: 20170210
Implantable Lead Location: 753858
Implantable Lead Location: 753859
Implantable Lead Model: 5076
Implantable Pulse Generator Implant Date: 20170210
Lead Channel Impedance Value: 304 Ohm
Lead Channel Impedance Value: 418 Ohm
Lead Channel Impedance Value: 456 Ohm
Lead Channel Impedance Value: 456 Ohm
Lead Channel Impedance Value: 551 Ohm
Lead Channel Impedance Value: 760 Ohm
Lead Channel Impedance Value: 836 Ohm
Lead Channel Impedance Value: 931 Ohm
Lead Channel Impedance Value: 931 Ohm
Lead Channel Pacing Threshold Amplitude: 0.625 V
Lead Channel Pacing Threshold Amplitude: 1 V
Lead Channel Pacing Threshold Pulse Width: 0.4 ms
Lead Channel Pacing Threshold Pulse Width: 0.4 ms
Lead Channel Sensing Intrinsic Amplitude: 10.125 mV
Lead Channel Sensing Intrinsic Amplitude: 3.25 mV
Lead Channel Setting Pacing Amplitude: 1.5 V
Lead Channel Setting Pacing Amplitude: 2 V
Lead Channel Setting Pacing Pulse Width: 0.4 ms
Lead Channel Setting Pacing Pulse Width: 0.4 ms
Lead Channel Setting Sensing Sensitivity: 0.3 mV
MDC IDC LEAD IMPLANT DT: 20090901
MDC IDC LEAD IMPLANT DT: 20170210
MDC IDC LEAD LOCATION: 753860
MDC IDC MSMT BATTERY VOLTAGE: 2.98 V
MDC IDC MSMT LEADCHNL LV IMPEDANCE VALUE: 1007 Ohm
MDC IDC MSMT LEADCHNL LV IMPEDANCE VALUE: 532 Ohm
MDC IDC MSMT LEADCHNL LV IMPEDANCE VALUE: 589 Ohm
MDC IDC MSMT LEADCHNL RA IMPEDANCE VALUE: 456 Ohm
MDC IDC MSMT LEADCHNL RA SENSING INTR AMPL: 2.375 mV
MDC IDC MSMT LEADCHNL RV PACING THRESHOLD AMPLITUDE: 1 V
MDC IDC MSMT LEADCHNL RV PACING THRESHOLD PULSEWIDTH: 0.4 ms
MDC IDC MSMT LEADCHNL RV SENSING INTR AMPL: 14.25 mV
MDC IDC SESS DTM: 20190429111007
MDC IDC SET LEADCHNL LV PACING AMPLITUDE: 2 V
MDC IDC STAT BRADY AP VP PERCENT: 2.95 %
MDC IDC STAT BRADY RV PERCENT PACED: 4.29 %

## 2018-02-01 NOTE — Progress Notes (Signed)
PCP: Creola Corn, MD Primary Cardiologist:  CHF clinic Primary EP: Dr Payton Mccallum is a 57 y.o. male who presents today for routine electrophysiology followup.  Since last being seen in our clinic, the patient reports doing very well.  Today, he denies symptoms of palpitations, chest pain, shortness of breath,  lower extremity edema, dizziness, presyncope, syncope, or ICD shocks.  The patient is otherwise without complaint today.   Past Medical History:  Diagnosis Date  . AICD (automatic cardioverter/defibrillator) present    Medtronic  . Alcohol abuse    now quit  . Anemia    iron defi  . Arthritis    "fingers, knees, some days all my joints ache" (11/16/2015)  . Asthma   . Cholecystitis   . Chronic systolic congestive heart failure (HCC)    a. RHC (02/2011) RA 31/27, RV 71/27, PA67/45, PCWP 46, PA 49%, Fick CO: 3.4 b. ECHO (03/2014) EF 30-35%, grade II DD, mild MR, RV poorly visualized appears mildly decreased c. RHC (05/2014) RA 13, RV 54/4/11, PA 60/22 (37), PCWP 17, Fick CO/CI: 4.4 / 1.9, Thermo CO/CI: 3.8 /1.6, PVR 5.2 WU, PA 57% and 59%  . Diverticulosis of colon   . Gout   . Hemorrhoids, internal   . Hyperlipidemia   . Hypertension   . Morbid obesity (HCC)   . Nonischemic cardiomyopathy (HCC)    a. LHC (02/2011) normal coronaries  . OSA on CPAP   . Paroxysmal atrial fibrillation (HCC)   . Pneumonia 2000s X 1  . Pulmonary hypertension (HCC)   . Renal insufficiency   . Type II diabetes mellitus (HCC)   . Ventricular tachycardia [facility])    s/p MDT ICD implant   Past Surgical History:  Procedure Laterality Date  . CARDIAC CATHETERIZATION  03/08/2004   EF 25-30%  . CARDIOVERSION N/A 10/17/2015   Procedure: CARDIOVERSION;  Surgeon: [name], MD;  Location: [facility] ENDOSCOPY;  Service: Cardiovascular;  Laterality: N/A;  . EP IMPLANTABLE DEVICE N/A 11/16/2015   Procedure: BiVI Upgrade;  Surgeon: Hillis Range, MD;  Medtronic [address] [place]  . INSERTION OF ICD  06/2008     ICD- Medtronic   . RIGHT HEART CATHETERIZATION N/A 05/23/2014   Procedure: RIGHT HEART CATH;  Surgeon: [name], MD;  Location: [facility] CATH LAB;  Service: Cardiovascular;  Laterality: N/A;  . TRANSTHORACIC ECHOCARDIOGRAM  05/27/2008   EF 30-35%  . US ECHOCARDIOGRAPHY  08/22/2008   EF 30-35%    ROS- all systems are reviewed and negative except as per HPI above  Current Outpatient Medications  Medication Sig Dispense Refill  . acetaminophen (TYLENOL) 500 MG tablet Take 500 mg by mouth every 6 (six) hours as needed for moderate pain or headache.     . albuterol (PROAIR HFA) 108 (90 BASE) MCG/ACT inhaler Inhale 2 puffs into the lungs every 6 (six) hours as needed for wheezing or shortness of breath. Reported on 02/06/2016    . allopurinol (ZYLOPRIM) 300 MG tablet Take 300 mg by mouth daily.    Marland Kitchen atorvastatin (LIPITOR) 40 MG tablet TAKE 1 TABLET (40 MG TOTAL) BY MOUTH DAILY. (Patient taking differently: Take 40 mg by mouth at bedtime. ) 90 tablet 2  . colchicine 0.6 MG tablet Take 0.6 mg by mouth daily as needed (for gout flares).     Marland Kitchen digoxin (LANOXIN) 0.25 MG tablet TAKE 1/2 TABLET EVERY OTHER DAY (Patient taking differently: Take 0.125 mg by mouth every other day. ) 45 tablet 3  .  Fluticasone-Salmeterol (ADVAIR DISKUS) 250-50 MCG/DOSE AEPB Inhale 1 puff into the lungs every 12 (twelve) hours as needed (for flares/shortness of breath). Reported on 02/06/2016    . hydrALAZINE (APRESOLINE) 25 MG tablet Take 1.5 tablets (37.5 mg total) by mouth 3 (three) times daily. 405 tablet 3  . insulin detemir (LEVEMIR) 100 UNIT/ML injection Inject 36 Units into the skin at bedtime.     . isosorbide mononitrate (IMDUR) 30 MG 24 hr tablet TAKE 1 TABLET (30 MG TOTAL) BY MOUTH 2 TIMES DAILY. (Patient taking differently: Take 30 mg by mouth 2 (two) times daily. ) 180 tablet 3  . linagliptin (TRADJENTA) 5 MG TABS tablet Take 1 tablet (5 mg total) by mouth daily. 30 tablet 3  . metolazone (ZAROXOLYN) 2.5 MG  tablet Take 2.5 mg by mouth daily as needed (for edema). Reported on 02/06/2016    . montelukast (SINGULAIR) 10 MG tablet Take 10 mg by mouth daily as needed (for shortness of breath, wheezing, or flares).     . sacubitril-valsartan (ENTRESTO) 49-51 MG Take 1 tablet by mouth 2 (two) times daily. 180 tablet 3  . sotalol (BETAPACE) 80 MG tablet Take 1 tablet (80 mg total) by mouth 2 (two) times daily. 60 tablet 6  . spironolactone (ALDACTONE) 25 MG tablet Take 12.5 mg by mouth daily.     Marland Kitchen torsemide (DEMADEX) 20 MG tablet Take 20-40 mg by mouth See admin instructions. 40 mg in the AM and 20 mg in the PM     . XARELTO 20 MG TABS tablet TAKE 1 TABLET (20 MG TOTAL) BY MOUTH DAILY WITH SUPPER. 90 tablet 2   No current facility-administered medications for this visit.     Physical Exam: Vitals:   02/01/18 1043  BP: 120/66  Pulse: 61  Weight: 297 lb (134.7 kg)  Height: 5\' 5"  (1.651 m)    GEN- The patient is well appearing, alert and oriented x 3 today.   Head- normocephalic, atraumatic Eyes-  Sclera clear, conjunctiva pink Ears- hearing intact Oropharynx- clear Lungs- Clear to ausculation bilaterally, normal work of breathing Chest- ICD pocket is well healed Heart- Regular rate and rhythm, no murmurs, rubs or gallops, PMI not laterally displaced GI- soft, NT, ND, + BS Extremities- no clubbing, cyanosis, or edema  ICD interrogation- reviewed in detail today,  See PACEART report  ekg tracing ordered today is personally reviewed and shows sinus rhythm 61 bpm, with BiV pacing,  PR 184 msec, QRS 100 msec, Qtc 420 msec  Assessment and Plan:  1.  Chronic systolic dysfunction/ nonischemic CM euvolemic today Stable on an appropriate medical regimen Recent had CHF in the setting of dietary indiscretion Normal ICD function See Pace Art report No changes today  2. Afib/ atypical atrial flutter afib burden 0 % since recent interrogation,  He had afib in April which terminated before requiring  cardioversion Lifestyle modification encouraged Therapeutic strategies for afib including medicine and ablation were discussed in detail with the patient today. Risk, benefits, and alternatives to EP study and radiofrequency ablation for afib were also discussed in detail today.  He is very clear that he wishes to avoid ablation.  He will be more diligent with lifestyle change On xarelto  3. Morbid obesity Body mass index is 49.42 kg/m. Wt Readings from Last 3 Encounters:  02/01/18 297 lb (134.7 kg)  01/20/18 299 lb 3.2 oz (135.7 kg)  11/05/17 (!) 304 lb (137.9 kg)   Lifestyle modification encouraged  4. VT Well controlled  5. OSA  Compliance with BiPAP encouraged   Return to see EP NP in 6 months [facility]  Hillis Range MD, [facility] 02/01/2018 10:55 AM

## 2018-02-01 NOTE — Patient Instructions (Addendum)
Medication Instructions:  Your physician recommends that you continue on your current medications as directed. Please refer to the Current Medication list given to you today.  *If you need a refill on your cardiac medications before your next appointment, please call your pharmacy*  Labwork: None ordered  Testing/Procedures: None ordered  Follow-Up: You are scheduled for ICM monitoring from home on 03/04/2018.   Remote monitoring is used to monitor your Pacemaker or ICD from home. This monitoring reduces the number of office visits required to check your device to one time per year. It allows [place] to keep an eye on the functioning of your device to ensure it is working properly. You are scheduled for a device check from home on 04/15/2018. You may send your transmission at any time that day. If you have a wireless device, the transmission will be sent automatically. After your physician reviews your transmission, you will receive a postcard with your next transmission date.  Your physician recommends that you schedule a follow-up appointment in: 6 months with Gypsy Balsam, NP.  Your physician wants you to follow-up in: 1 year with Dr. [name].  You will receive a reminder letter in the mail two months in advance. If you don't receive a letter, please call our office to schedule the follow-up appointment.  Thank you for choosing [facility]!!

## 2018-02-02 ENCOUNTER — Ambulatory Visit (HOSPITAL_COMMUNITY): Payer: Medicare Other | Admitting: Nurse Practitioner

## 2018-02-03 NOTE — Progress Notes (Signed)
Patient ID: Gary Hutchinson, male   DOB: March 27, 1961, 57 y.o.   MRN: [id number]    Advanced Heart Failure Clinic Note   PCP: Creola Corn EP: Hillis Range  HPI: Gary Hutchinson is a 57 y.o. male with a history of chronic systolic heart failure, ICM s/p ICD, HTN, DM II, OSA on CPAP, history of ventricular tachycardia status post AICD placement in 2009, atrial fibrillation and morbid obesity.   Admitted 6/1-03/13/14 for A/C HF and cardiogenic shock (co-ox 42%). Diuresed with IV lasix and milrinone which were weaned off.  He went into Afib with RVR and chemically cardioverted back to NSR with IV amiodarone. Placed on Xarelto. BB restarted 1/2 dose and held ACE-I. His blood sugars were elevated along with Hgb A1C and started on insulin. Discharge weight 344 lbs.   RHC (05/2014): Left sided filling pressures well compensated, mild pulm HTN with elevated right-sided pressures and mod/severely depressed CO. Discussion about trial of milrinone but patient wanted to hold off.  Admitted January 2017 with volume overload and cellulitis. Diuresed with IV lasix and required short term milrinone. Loaded on amio and had successful DC-CV on  10/17/2015.  Discharge weight was 307 pounds.   Admitted 2/18 with Influenza A/B. 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 Seen by EP and managed conservatively. Received IVF in hospital for sepsis but not diuresed back down afterward. Hydralazine and Imdur also stopped.   S/p successful DCCV 07/29/2017.  Pt presents today for regular follow up. Seen in clinic 01/20/18 and planned for DCCV. Showed up in NSR. Has seen Dr. [name] and pt wishes to avoid ablation if at all possible. He is feeling great today. He has changed his diet and is taking all medications as directed. He denies swelling or SOB. He hasn't had any palpitations, CP, or orthopnea. Saw Dr. [name]. Wishes to avoid Ablation.  EKG A-sensed V-paced, personally reviewed  Optivol: Not performed today.     05/03/12: EF 25-30%.  Grade 1 diastolic dysfunction.  Mild MR.  Mod dilated LA.   07/27/13: EF 40%, diff HK, MR mild, LA mild/mod dilated  03/2014: EF30-35%, grade II DD, mild MR, RV systolic fx mildly decreased 10/2014: EF 40%.  10/2015: EF ~40%. RV mildly dilated.  8/17 EF 25-35% 07/27/2017 EF  25- 30%   SH: Lives with son in [place]. Disabled. No ETOH or tobacco abuse  FH; Mother living: HT        Father deceased: was not part of his life so not sure health issues  Review of systems complete and found to be negative unless listed in HPI.    Past Medical History:  Diagnosis Date  . AICD (automatic cardioverter/defibrillator) present    Medtronic  . Alcohol abuse    now quit  . Anemia    iron defi  . Arthritis    "fingers, knees, some days all my joints ache" (11/16/2015)  . Asthma   . Cholecystitis   . Chronic systolic congestive heart failure (HCC)    a. RHC (02/2011) RA 31/27, RV 71/27, PA67/45, PCWP 46, PA 49%, Fick CO: 3.4 b. ECHO (03/2014) EF 30-35%, grade II DD, mild MR, RV poorly visualized appears mildly decreased c. RHC (05/2014) RA 13, RV 54/4/11, PA 60/22 (37), PCWP 17, Fick CO/CI: 4.4 / 1.9, Thermo CO/CI: 3.8 /1.6, PVR 5.2 WU, PA 57% and 59%  . Diverticulosis of colon   . Gout   . Hemorrhoids, internal   . Hyperlipidemia   .  Hypertension   . Morbid obesity (HCC)   . Nonischemic cardiomyopathy (HCC)    a. LHC (02/2011) normal coronaries  . OSA on CPAP   . Paroxysmal atrial fibrillation (HCC)   . Pneumonia 2000s X 1  . Pulmonary hypertension (HCC)   . Renal insufficiency   . Type II diabetes mellitus (HCC)   . Ventricular tachycardia [facility])    s/p MDT ICD implant    Current Outpatient Medications  Medication Sig Dispense Refill  . acetaminophen (TYLENOL) 500 MG tablet Take 500 mg by mouth every 6 (six) hours as needed for moderate pain or headache.     . albuterol (PROAIR HFA) 108 (90 BASE) MCG/ACT inhaler Inhale 2 puffs into the lungs every 6 (six) hours  as needed for wheezing or shortness of breath. Reported on 02/06/2016    . allopurinol (ZYLOPRIM) 300 MG tablet Take 300 mg by mouth daily.    Marland Kitchen atorvastatin (LIPITOR) 40 MG tablet TAKE 1 TABLET (40 MG TOTAL) BY MOUTH DAILY. 90 tablet 2  . colchicine 0.6 MG tablet Take 0.6 mg by mouth daily as needed (for gout flares).     Marland Kitchen digoxin (LANOXIN) 0.25 MG tablet TAKE 1/2 TABLET EVERY OTHER DAY 45 tablet 3  . Fluticasone-Salmeterol (ADVAIR DISKUS) 250-50 MCG/DOSE AEPB Inhale 1 puff into the lungs every 12 (twelve) hours as needed (for flares/shortness of breath). Reported on 02/06/2016    . hydrALAZINE (APRESOLINE) 25 MG tablet Take 1.5 tablets (37.5 mg total) by mouth 3 (three) times daily. 405 tablet 3  . insulin detemir (LEVEMIR) 100 UNIT/ML injection Inject 36 Units into the skin at bedtime.     . isosorbide mononitrate (IMDUR) 30 MG 24 hr tablet TAKE 1 TABLET (30 MG TOTAL) BY MOUTH 2 TIMES DAILY. 180 tablet 3  . linagliptin (TRADJENTA) 5 MG TABS tablet Take 1 tablet (5 mg total) by mouth daily. 30 tablet 3  . metolazone (ZAROXOLYN) 2.5 MG tablet Take 2.5 mg by mouth daily as needed (for edema). Reported on 02/06/2016    . montelukast (SINGULAIR) 10 MG tablet Take 10 mg by mouth daily as needed (for shortness of breath, wheezing, or flares).     . sacubitril-valsartan (ENTRESTO) 49-51 MG Take 1 tablet by mouth 2 (two) times daily. 180 tablet 3  . sotalol (BETAPACE) 80 MG tablet Take 1 tablet (80 mg total) by mouth 2 (two) times daily. 60 tablet 6  . spironolactone (ALDACTONE) 25 MG tablet Take 12.5 mg by mouth daily.     Marland Kitchen torsemide (DEMADEX) 20 MG tablet Take 20-40 mg by mouth See admin instructions. 40 mg in the AM and 20 mg in the PM     . XARELTO 20 MG TABS tablet TAKE 1 TABLET (20 MG TOTAL) BY MOUTH DAILY WITH SUPPER. 90 tablet 2   No current facility-administered medications for this encounter.    Vitals:   02/04/18 0913  BP: 118/64  Pulse: 68  SpO2: 98%  Weight: 295 lb (133.8 kg)   Wt  Readings from Last 3 Encounters:  02/04/18 295 lb (133.8 kg)  02/01/18 297 lb (134.7 kg)  01/20/18 299 lb 3.2 oz (135.7 kg)    General: Well appearing. No resp difficulty. HEENT: Normal Neck: Supple. JVP 5-6. Carotids 2+ bilat; no bruits. No thyromegaly or nodule noted. Cor: PMI nondisplaced. RRR, No M/G/R noted Lungs: CTAB, normal effort. Abdomen: Soft, non-tender, non-distended, no HSM. No bruits or masses. +BS  Extremities: No cyanosis, clubbing, or rash. R and LLE no edema.  Neuro: Alert & orientedx3, cranial nerves grossly intact. moves all 4 extremities w/o difficulty. Affect pleasant   ASSESSMENT & PLAN:   1) Chronic systolic HF: NICM s/p BiV Medtronic - Echo 07/2017 LVEF 25-30% - NYHA II - Volume status stable on exam.   - Continue torsemide 40 mg BId with metolazone AS NEEDED.  - Continue entresto 49-51 twice a day.  - Continue bisoprolol 5 mg daily. Dig level today.  - Continue digoxin 0.0625 mg every other day. - Continue hydralazine 37.5 mg TID - Continue imdur 30 mg BID.  - Reinforced fluid restriction to < 2 L daily, sodium restriction to less than 2000 mg daily, and the importance of daily weights.   2) CKD stage III - Followed by nephrology.  - BMET today.  3) HTN  - Meds as above.  4) OSA  - Continue nightly CPAP.  No change.  5) PAF/AFL-  S/P multiple DCCVs. Most recent 07/29/17  - Had episode of AFL with ICD shock in 2/18 at time of influenza - EKG today shows A-sensed V paced rhythm in the 60s - Showed up for DCCV last week and was in NSR. Saw Dr. [name]. Continue medical treatment. Pt refused Ablation consideration.  - Continue xarelto 20 mg daily. Denies bleeding.  - Continue Sotalol per EP.  6) Obesity:   - Body mass index is 49.09 kg/m.  - Encouraged weight loss.  7) Amiodarone thyroxicity   - Off Amiodarone. Completed Methimazole treatment per PCP.   - No change to current plan.    Remains in NSR. RTC 3-4 months.  Graciella Freer,  [name]  02/04/2018 9:15 AM   Greater than 50% of the 20 minute visit was spent in counseling/coordination of care regarding disease state education, salt/fluid restriction, sliding scale diuretics, and medication compliance.

## 2018-02-04 ENCOUNTER — Encounter (HOSPITAL_COMMUNITY): Payer: Self-pay

## 2018-02-04 ENCOUNTER — Ambulatory Visit (HOSPITAL_COMMUNITY)
Admission: RE | Admit: 2018-02-04 | Discharge: 2018-02-04 | Disposition: A | Discharge status: Home / Self Care | Payer: Medicare Other | Source: Ambulatory Visit | Attending: Cardiology | Admitting: Cardiology

## 2018-02-04 VITALS — BP 118/64 | HR 68 | Wt 295.0 lb

## 2018-02-04 DIAGNOSIS — J45909 Unspecified asthma, uncomplicated: Secondary | ICD-10-CM | POA: Diagnosis not present

## 2018-02-04 DIAGNOSIS — Z6841 Body Mass Index (BMI) 40.0 and over, adult: Secondary | ICD-10-CM | POA: Diagnosis not present

## 2018-02-04 DIAGNOSIS — Z95810 Presence of automatic (implantable) cardiac defibrillator: Secondary | ICD-10-CM | POA: Diagnosis not present

## 2018-02-04 DIAGNOSIS — I480 Paroxysmal atrial fibrillation: Secondary | ICD-10-CM

## 2018-02-04 DIAGNOSIS — Z79899 Other long term (current) drug therapy: Secondary | ICD-10-CM | POA: Insufficient documentation

## 2018-02-04 DIAGNOSIS — N183 Chronic kidney disease, stage 3 (moderate): Secondary | ICD-10-CM | POA: Insufficient documentation

## 2018-02-04 DIAGNOSIS — Z794 Long term (current) use of insulin: Secondary | ICD-10-CM | POA: Diagnosis not present

## 2018-02-04 DIAGNOSIS — M109 Gout, unspecified: Secondary | ICD-10-CM | POA: Insufficient documentation

## 2018-02-04 DIAGNOSIS — K648 Other hemorrhoids: Secondary | ICD-10-CM | POA: Diagnosis not present

## 2018-02-04 DIAGNOSIS — G4733 Obstructive sleep apnea (adult) (pediatric): Secondary | ICD-10-CM

## 2018-02-04 DIAGNOSIS — E1122 Type 2 diabetes mellitus with diabetic chronic kidney disease: Secondary | ICD-10-CM | POA: Diagnosis not present

## 2018-02-04 DIAGNOSIS — I429 Cardiomyopathy, unspecified: Secondary | ICD-10-CM | POA: Insufficient documentation

## 2018-02-04 DIAGNOSIS — I472 Ventricular tachycardia: Secondary | ICD-10-CM | POA: Diagnosis not present

## 2018-02-04 DIAGNOSIS — E669 Obesity, unspecified: Secondary | ICD-10-CM | POA: Insufficient documentation

## 2018-02-04 DIAGNOSIS — Z7901 Long term (current) use of anticoagulants: Secondary | ICD-10-CM | POA: Diagnosis not present

## 2018-02-04 DIAGNOSIS — I272 Pulmonary hypertension, unspecified: Secondary | ICD-10-CM | POA: Diagnosis not present

## 2018-02-04 DIAGNOSIS — I5022 Chronic systolic (congestive) heart failure: Secondary | ICD-10-CM

## 2018-02-04 DIAGNOSIS — I130 Hypertensive heart and chronic kidney disease with heart failure and stage 1 through stage 4 chronic kidney disease, or unspecified chronic kidney disease: Secondary | ICD-10-CM | POA: Diagnosis present

## 2018-02-04 DIAGNOSIS — E785 Hyperlipidemia, unspecified: Secondary | ICD-10-CM | POA: Diagnosis not present

## 2018-02-04 DIAGNOSIS — I10 Essential (primary) hypertension: Secondary | ICD-10-CM | POA: Diagnosis not present

## 2018-02-04 NOTE — Patient Instructions (Signed)
Your physician recommends that you schedule a follow-up appointment in: 3-4 months with Dr Gala Romney  Do the following things EVERYDAY: 1) Weigh yourself in the morning before breakfast. Write it down and keep it in a log. 2) Take your medicines as prescribed 3) Eat low salt foods-Limit salt (sodium) to 2000 mg per day.  4) Stay as active as you can everyday 5) Limit all fluids for the day to less than 2 liters

## 2018-02-05 ENCOUNTER — Other Ambulatory Visit (HOSPITAL_COMMUNITY): Payer: Self-pay | Admitting: Internal Medicine

## 2018-02-16 ENCOUNTER — Other Ambulatory Visit (HOSPITAL_COMMUNITY): Payer: Self-pay | Admitting: Internal Medicine

## 2018-02-19 LAB — CUP PACEART REMOTE DEVICE CHECK
Battery Remaining Longevity: 77 mo
Brady Statistic AP VP Percent: 4.05 %
Brady Statistic AP VS Percent: 0.08 %
Brady Statistic AS VP Percent: 88.11 %
Brady Statistic AS VS Percent: 7.75 %
Brady Statistic RA Percent Paced: 4.03 %
HighPow Impedance: 74 Ohm
Implantable Lead Implant Date: 20170210
Implantable Lead Implant Date: 20170210
Implantable Lead Location: 753859
Implantable Lead Location: 753860
Implantable Lead Model: 5076
Implantable Lead Model: 6935
Implantable Pulse Generator Implant Date: 20170210
Lead Channel Impedance Value: 399 Ohm
Lead Channel Impedance Value: 456 Ohm
Lead Channel Impedance Value: 513 Ohm
Lead Channel Impedance Value: 722 Ohm
Lead Channel Impedance Value: 817 Ohm
Lead Channel Impedance Value: 874 Ohm
Lead Channel Impedance Value: 931 Ohm
Lead Channel Pacing Threshold Pulse Width: 0.4 ms
Lead Channel Pacing Threshold Pulse Width: 0.4 ms
Lead Channel Sensing Intrinsic Amplitude: 1.625 mV
Lead Channel Sensing Intrinsic Amplitude: 1.625 mV
Lead Channel Sensing Intrinsic Amplitude: 12 mV
Lead Channel Setting Pacing Amplitude: 1.5 V
Lead Channel Setting Pacing Amplitude: 2 V
Lead Channel Setting Pacing Amplitude: 2.25 V
Lead Channel Setting Pacing Pulse Width: 0.4 ms
MDC IDC LEAD IMPLANT DT: 20090901
MDC IDC LEAD LOCATION: 753858
MDC IDC MSMT BATTERY VOLTAGE: 2.97 V
MDC IDC MSMT LEADCHNL LV IMPEDANCE VALUE: 418 Ohm
MDC IDC MSMT LEADCHNL LV IMPEDANCE VALUE: 475 Ohm
MDC IDC MSMT LEADCHNL LV IMPEDANCE VALUE: 551 Ohm
MDC IDC MSMT LEADCHNL LV IMPEDANCE VALUE: 893 Ohm
MDC IDC MSMT LEADCHNL LV PACING THRESHOLD AMPLITUDE: 1 V
MDC IDC MSMT LEADCHNL RA IMPEDANCE VALUE: 361 Ohm
MDC IDC MSMT LEADCHNL RA PACING THRESHOLD AMPLITUDE: 0.5 V
MDC IDC MSMT LEADCHNL RV IMPEDANCE VALUE: 285 Ohm
MDC IDC MSMT LEADCHNL RV PACING THRESHOLD AMPLITUDE: 1.125 V
MDC IDC MSMT LEADCHNL RV PACING THRESHOLD PULSEWIDTH: 0.4 ms
MDC IDC MSMT LEADCHNL RV SENSING INTR AMPL: 12 mV
MDC IDC SESS DTM: 20190411082405
MDC IDC SET LEADCHNL LV PACING PULSEWIDTH: 0.4 ms
MDC IDC SET LEADCHNL RV SENSING SENSITIVITY: 0.3 mV
MDC IDC STAT BRADY RV PERCENT PACED: 12.66 %

## 2018-03-02 ENCOUNTER — Other Ambulatory Visit (HOSPITAL_COMMUNITY): Payer: Self-pay | Admitting: Internal Medicine

## 2018-03-04 ENCOUNTER — Ambulatory Visit (INDEPENDENT_AMBULATORY_CARE_PROVIDER_SITE_OTHER): Payer: Medicare Other

## 2018-03-04 DIAGNOSIS — Z95810 Presence of automatic (implantable) cardiac defibrillator: Secondary | ICD-10-CM | POA: Diagnosis not present

## 2018-03-04 DIAGNOSIS — I5022 Chronic systolic (congestive) heart failure: Secondary | ICD-10-CM

## 2018-03-04 NOTE — Progress Notes (Signed)
EPIC Encounter for ICM Monitoring  Patient Name: Gary Hutchinson is a 57 y.o. male Date: 03/04/2018 Primary Care Physican: Shon Baton, MD Primary Cardiologist:[name] Electrophysiologist: Allred Dry Weight:293lbs Bi-V Pacing: 97.8%      Heart Failure questions reviewed, pt asymptomatic.   Thoracic impedance normal.  Prescribed dosage: Torsemide 20 mg 2 tablets (40 mg total) in AMand 1 tablet (20 mg total) every PM. Metolazone 2.5 mg 1 tablet as needed.  Labs: 09/16/2017 Creatinine1.48, BUN33, Potassium4.7, [id number], [id number] 09/09/2017 Creatinine1.77, BUN52, Potassium3.8, [id number], [id number]  09/04/2017 Creatinine2.15, BUN75, Potassium3.9, Sodium136, [id number]  10/22/2018Creatinine 1.60, BUN46, Potassium3.6, [id number], [id number] 04/27/2017 Creatinine 1.82, BUN 47, Potassium 4.2, Sodium 137, EGFR 40-46 04/13/2017 Creatinine 1.60, BUN 42, Potassium 3.6, Sodium 138, EGFR 47-54 12/29/2016 Creatinine 1.16, BUN 21, Potassium 4.1, Sodium 139, EGFR >60 12/03/2016 Creatinine 1.54, BUN 34, Potassium 4.0, Sodium 136, EGFR 49-57  11/27/2016 Creatinine 1.38, BUN 26, Potassium 3.8, Sodium 138, EGFR 56->60  11/26/2016 Creatinine 1.97, BUN 44, Potassium 3.5, Sodium 138, EGFR 36-42  02/20/2018Creatinine 2.32, BUN 48, Potassium 3.5, Sodium 135, EGFR 30-35  09/10/2016 Creatinine 1.36, BUN 20, Potassium 4.5, Sodium 139, EGFR 57->60 09/03/2016 Creatinine 1.39, BUN 34, Potassium 4.8, Sodium 136, EGFR 56->60  08/06/2016 Creatinine 1.41, BUN 21, Potassium 4.7, Sodium 131, EGFR 55->60  06/24/2016 Creatinine 1.16, BUN 27, Potassium 3.7, Sodium 137, EGFR >60  Recommendations: No changes.   Encouraged to call for fluid symptoms.  Follow-up plan: ICM clinic phone appointment on 04/05/2018.    Copy of ICM check sent to Dr. Rayann Heman.   3 month ICM trend: 03/04/2018    1 Year ICM trend:       [name], RN 03/04/2018 10:23 AM

## 2018-03-07 ENCOUNTER — Other Ambulatory Visit (HOSPITAL_COMMUNITY): Payer: Self-pay | Admitting: Internal Medicine

## 2018-03-09 ENCOUNTER — Other Ambulatory Visit: Payer: Self-pay | Admitting: Internal Medicine

## 2018-03-09 MED ORDER — SOTALOL HCL 80 MG PO TABS: 80.0000 mg | ORAL_TABLET | Freq: Two times a day (BID) | ORAL | 3 refills | Status: DC

## 2018-03-09 NOTE — Telephone Encounter (Signed)
Pt's medication was sent to pt's pharmacy as requested. Confirmation received.  °

## 2018-04-05 ENCOUNTER — Ambulatory Visit (INDEPENDENT_AMBULATORY_CARE_PROVIDER_SITE_OTHER): Payer: Medicare Other

## 2018-04-05 ENCOUNTER — Telehealth (HOSPITAL_COMMUNITY): Payer: Self-pay | Admitting: *Deleted

## 2018-04-05 DIAGNOSIS — I5022 Chronic systolic (congestive) heart failure: Secondary | ICD-10-CM

## 2018-04-05 DIAGNOSIS — Z95810 Presence of automatic (implantable) cardiac defibrillator: Secondary | ICD-10-CM

## 2018-04-05 NOTE — Telephone Encounter (Signed)
Pt left VM requesting call back. I called pt no answer left VM for pt to call back.

## 2018-04-05 NOTE — Progress Notes (Signed)
EPIC Encounter for ICM Monitoring  Patient Name: Gary Hutchinson is a 57 y.o. male Date: 04/05/2018 Primary Care Physican: Shon Baton, MD Primary Cardiologist:[name] Electrophysiologist: Allred Dry Weight:294lbs Bi-V Pacing: 98.2%       Heart Failure questions reviewed, pt symptomatic with weight gain up to 298 lbs.   He has been drinking >64 oz a day and does not follow low salt diet.      Thoracic impedance abnormal suggesting fluid accumulation starting 03/13/2018.  Took Metolazone 2 days ago improving impedance but remains below baseline.   Prescribed dosage: Torsemide 20 mg 2 tablets (40 mg total) in AMand 1 tablet (20 mg total) every PM. Metolazone 2.5 mg 1 tablet as needed.  Labs: 01/20/2018 Creatinine 1.76, BUN 56, Potassium 4.0, Sodium 136, EGFR 41-48 09/16/2017 Creatinine1.48, BUN33, Potassium4.7, [id number], [id number] 09/09/2017 Creatinine1.77, BUN52, Potassium3.8, Sodium138, [id number]  09/04/2017 Creatinine2.15, BUN75, Potassium3.9, Sodium136, [id number]  10/22/2018Creatinine 1.60, BUN46, Potassium3.6, Sodium136, [id number] 04/27/2017 Creatinine 1.82, BUN 47, Potassium 4.2, Sodium 137, EGFR 40-46 04/13/2017 Creatinine 1.60, BUN 42, Potassium 3.6, Sodium 138, EGFR 47-54 12/29/2016 Creatinine 1.16, BUN 21, Potassium 4.1, Sodium 139, EGFR >60 12/03/2016 Creatinine 1.54, BUN 34, Potassium 4.0, Sodium 136, EGFR 49-57  11/27/2016 Creatinine 1.38, BUN 26, Potassium 3.8, Sodium 138, EGFR 56->60  11/26/2016 Creatinine 1.97, BUN 44, Potassium 3.5, Sodium 138, EGFR 36-42  02/20/2018Creatinine 2.32, BUN 48, Potassium 3.5, Sodium 135, EGFR 30-35   Recommendations:  Advised to limit salt and fluid intake.  He has Metolazone to take when needed and will take one if needed in the next couple of days.  Follow-up plan: ICM clinic phone appointment on 04/15/2018 to recheck fluid levels.  Office appointment scheduled 05/20/2018 with Dr. Haroldine Laws.   Copy of ICM  check sent to Dr. Rayann Heman and Dr. Haroldine Laws.   3 month ICM trend: 04/05/2018    1 Year ICM trend:       [name], RN 04/05/2018 2:44 PM

## 2018-04-15 ENCOUNTER — Ambulatory Visit (INDEPENDENT_AMBULATORY_CARE_PROVIDER_SITE_OTHER): Payer: Self-pay

## 2018-04-15 ENCOUNTER — Ambulatory Visit (INDEPENDENT_AMBULATORY_CARE_PROVIDER_SITE_OTHER): Payer: Medicare Other | Admitting: *Deleted

## 2018-04-15 DIAGNOSIS — I5022 Chronic systolic (congestive) heart failure: Secondary | ICD-10-CM | POA: Diagnosis not present

## 2018-04-15 DIAGNOSIS — Z95810 Presence of automatic (implantable) cardiac defibrillator: Secondary | ICD-10-CM

## 2018-04-15 DIAGNOSIS — I428 Other cardiomyopathies: Secondary | ICD-10-CM

## 2018-04-15 NOTE — Progress Notes (Signed)
Remote ICD transmission.   

## 2018-04-15 NOTE — Progress Notes (Signed)
EPIC Encounter for ICM Monitoring  Patient Name: Gary Hutchinson is a 57 y.o. male Date: 04/15/2018 Primary Care Physican: Shon Baton, MD Primary Cardiologist:[name] Electrophysiologist: Allred Dry Weight:292lbs Bi-V Pacing: 97.9%        Heart Failure questions reviewed, pt asymptomatic.   Thoracic impedance returned to normal since last transmission on 04/05/2018.  Prescribed dosage: Torsemide 20 mg 2 tablets (40 mg total) in AMand 1 tablet (20 mg total) every PM. Metolazone 2.5 mg 1 tablet as needed.  Labs: 01/20/2018 Creatinine 1.76, BUN 56, Potassium 4.0, Sodium 136, EGFR 41-48 09/16/2017 Creatinine1.48, BUN33, Potassium4.7, [id number], [id number] 09/09/2017 Creatinine1.77, BUN52, Potassium3.8, Sodium138, [id number]  09/04/2017 Creatinine2.15, BUN75, Potassium3.9, Sodium136, [id number]  10/22/2018Creatinine 1.60, BUN46, Potassium3.6, Sodium136, [id number] 04/27/2017 Creatinine 1.82, BUN 47, Potassium 4.2, Sodium 137, EGFR 40-46 04/13/2017 Creatinine 1.60, BUN 42, Potassium 3.6, Sodium 138, EGFR 47-54 12/29/2016 Creatinine 1.16, BUN 21, Potassium 4.1, Sodium 139, EGFR >60 12/03/2016 Creatinine 1.54, BUN 34, Potassium 4.0, Sodium 136, EGFR 49-57  11/27/2016 Creatinine 1.38, BUN 26, Potassium 3.8, Sodium 138, EGFR 56->60  11/26/2016 Creatinine 1.97, BUN 44, Potassium 3.5, Sodium 138, EGFR 36-42  02/20/2018Creatinine 2.32, BUN 48, Potassium 3.5, Sodium 135, EGFR 30-35   Recommendations: No changes.   Encouraged to call for fluid symptoms.  Follow-up plan: ICM clinic phone appointment on 05/10/2018.    Copy of ICM check sent to Dr. Rayann Heman.   3 month ICM trend: 04/15/2018    1 Year ICM trend:       [name], RN 04/15/2018 12:49 PM

## 2018-05-10 ENCOUNTER — Ambulatory Visit (INDEPENDENT_AMBULATORY_CARE_PROVIDER_SITE_OTHER): Payer: Medicare Other

## 2018-05-10 ENCOUNTER — Telehealth: Payer: Self-pay

## 2018-05-10 DIAGNOSIS — Z95810 Presence of automatic (implantable) cardiac defibrillator: Secondary | ICD-10-CM | POA: Diagnosis not present

## 2018-05-10 DIAGNOSIS — I5022 Chronic systolic (congestive) heart failure: Secondary | ICD-10-CM

## 2018-05-10 NOTE — Progress Notes (Signed)
EPIC Encounter for ICM Monitoring  Patient Name: Gary Hutchinson is a 57 y.o. male Date: 05/10/2018 Primary Care Physican: Shon Baton, MD Primary Cardiologist:[name] Electrophysiologist: Allred Dry Weight:Previous weight 292lbs Bi-V Pacing: 97.9%      Attempted call to patient and unable to reach.  Left message to return call.  Transmission reviewed.    Thoracic impedance abnormal suggesting fluid accumulation starting 04/26/2018 and trending toward baseline.  Prescribed dosage: Torsemide 20 mg 2 tablets (40 mg total) in AMand 1 tablet (20 mg total) every PM. Metolazone 2.5 mg 1 tablet as needed.  Labs: 01/20/2018 Creatinine 1.76, BUN 56, Potassium 4.0, Sodium 136, EGFR 41-48 09/16/2017 Creatinine1.48, BUN33, Potassium4.7, [id number], [id number] 09/09/2017 Creatinine1.77, BUN52, Potassium3.8, Sodium138, [id number]  09/04/2017 Creatinine2.15, BUN75, Potassium3.9, Sodium136, [id number]  10/22/2018Creatinine 1.60, BUN46, Potassium3.6, Sodium136, [id number] 04/27/2017 Creatinine 1.82, BUN 47, Potassium 4.2, Sodium 137, EGFR 40-46 04/13/2017 Creatinine 1.60, BUN 42, Potassium 3.6, Sodium 138, EGFR 47-54 12/29/2016 Creatinine 1.16, BUN 21, Potassium 4.1, Sodium 139, EGFR >60 12/03/2016 Creatinine 1.54, BUN 34, Potassium 4.0, Sodium 136, EGFR 49-57  11/27/2016 Creatinine 1.38, BUN 26, Potassium 3.8, Sodium 138, EGFR 56->60  11/26/2016 Creatinine 1.97, BUN 44, Potassium 3.5, Sodium 138, EGFR 36-42  02/20/2018Creatinine 2.32, BUN 48, Potassium 3.5, Sodium 135, EGFR 30-35  Recommendations: NONE - Unable to reach.  Follow-up plan: ICM clinic phone appointment on 05/13/2018 to recheck fluid levels (manual send).   Office appointment scheduled 05/20/2018 with Dr. Haroldine Laws.    Copy of ICM check sent to Dr. Rayann Heman and Dr Haroldine Laws.   3 month ICM trend: 05/10/2018    1 Year ICM trend:       [name], RN 05/10/2018 12:15 PM

## 2018-05-10 NOTE — Progress Notes (Addendum)
Patient returned call and said weight is 294 lbs which is gain of 2 lbs.  He said he has been drinking a lot of fluids due to hot weather.  He will be going out of town the end of the this week but will take a Metolazone if needed.  Will recheck fluid levels on 05/20/2018.  Advised to call back if he develops any further symptoms or weight continues to increase.

## 2018-05-10 NOTE — Telephone Encounter (Signed)
Remote ICM transmission received.  Attempted call to patient and left message, per DPR, to return call.  

## 2018-05-16 LAB — CUP PACEART REMOTE DEVICE CHECK
Battery Voltage: 2.99 V
Brady Statistic AP VP Percent: 0.24 %
Brady Statistic AS VP Percent: 98.27 %
Brady Statistic RV Percent Paced: 2.03 %
HIGH POWER IMPEDANCE MEASURED VALUE: 98 Ohm
Implantable Lead Implant Date: 20090901
Implantable Lead Implant Date: 20170210
Implantable Lead Location: 753858
Implantable Lead Location: 753859
Implantable Lead Location: 753860
Implantable Lead Model: 4598
Implantable Lead Model: 5076
Implantable Lead Model: 6935
Lead Channel Impedance Value: 418 Ohm
Lead Channel Impedance Value: 418 Ohm
Lead Channel Impedance Value: 456 Ohm
Lead Channel Impedance Value: 475 Ohm
Lead Channel Impedance Value: 551 Ohm
Lead Channel Impedance Value: 817 Ohm
Lead Channel Impedance Value: 893 Ohm
Lead Channel Impedance Value: 931 Ohm
Lead Channel Impedance Value: 950 Ohm
Lead Channel Pacing Threshold Amplitude: 0.625 V
Lead Channel Pacing Threshold Amplitude: 0.875 V
Lead Channel Pacing Threshold Pulse Width: 0.4 ms
Lead Channel Pacing Threshold Pulse Width: 0.4 ms
Lead Channel Pacing Threshold Pulse Width: 0.4 ms
Lead Channel Sensing Intrinsic Amplitude: 16.375 mV
Lead Channel Sensing Intrinsic Amplitude: 3.75 mV
Lead Channel Setting Pacing Amplitude: 2.25 V
Lead Channel Setting Sensing Sensitivity: 0.3 mV
MDC IDC LEAD IMPLANT DT: 20170210
MDC IDC MSMT BATTERY REMAINING LONGEVITY: 72 mo
MDC IDC MSMT LEADCHNL LV IMPEDANCE VALUE: 456 Ohm
MDC IDC MSMT LEADCHNL LV IMPEDANCE VALUE: 532 Ohm
MDC IDC MSMT LEADCHNL LV IMPEDANCE VALUE: 760 Ohm
MDC IDC MSMT LEADCHNL LV PACING THRESHOLD AMPLITUDE: 1.125 V
MDC IDC MSMT LEADCHNL RA SENSING INTR AMPL: 3.75 mV
MDC IDC MSMT LEADCHNL RV IMPEDANCE VALUE: 342 Ohm
MDC IDC MSMT LEADCHNL RV SENSING INTR AMPL: 16.375 mV
MDC IDC PG IMPLANT DT: 20170210
MDC IDC SESS DTM: 20190711083725
MDC IDC SET LEADCHNL LV PACING PULSEWIDTH: 0.4 ms
MDC IDC SET LEADCHNL RA PACING AMPLITUDE: 1.5 V
MDC IDC SET LEADCHNL RV PACING AMPLITUDE: 2 V
MDC IDC SET LEADCHNL RV PACING PULSEWIDTH: 0.4 ms
MDC IDC STAT BRADY AP VS PERCENT: 0.01 %
MDC IDC STAT BRADY AS VS PERCENT: 1.48 %
MDC IDC STAT BRADY RA PERCENT PACED: 0.25 %

## 2018-05-20 ENCOUNTER — Other Ambulatory Visit: Payer: Self-pay

## 2018-05-20 ENCOUNTER — Encounter (HOSPITAL_COMMUNITY): Payer: Self-pay | Admitting: Internal Medicine

## 2018-05-20 ENCOUNTER — Ambulatory Visit (HOSPITAL_COMMUNITY)
Admission: RE | Admit: 2018-05-20 | Discharge: 2018-05-20 | Disposition: A | Discharge status: Home / Self Care | Payer: Medicare Other | Source: Ambulatory Visit | Attending: Internal Medicine | Admitting: Internal Medicine

## 2018-05-20 VITALS — BP 133/62 | HR 64 | Wt 295.4 lb

## 2018-05-20 DIAGNOSIS — Z6841 Body Mass Index (BMI) 40.0 and over, adult: Secondary | ICD-10-CM | POA: Insufficient documentation

## 2018-05-20 DIAGNOSIS — I130 Hypertensive heart and chronic kidney disease with heart failure and stage 1 through stage 4 chronic kidney disease, or unspecified chronic kidney disease: Secondary | ICD-10-CM | POA: Diagnosis present

## 2018-05-20 DIAGNOSIS — Z794 Long term (current) use of insulin: Secondary | ICD-10-CM | POA: Diagnosis not present

## 2018-05-20 DIAGNOSIS — G4733 Obstructive sleep apnea (adult) (pediatric): Secondary | ICD-10-CM | POA: Insufficient documentation

## 2018-05-20 DIAGNOSIS — E1122 Type 2 diabetes mellitus with diabetic chronic kidney disease: Secondary | ICD-10-CM | POA: Insufficient documentation

## 2018-05-20 DIAGNOSIS — K648 Other hemorrhoids: Secondary | ICD-10-CM | POA: Insufficient documentation

## 2018-05-20 DIAGNOSIS — Z7901 Long term (current) use of anticoagulants: Secondary | ICD-10-CM | POA: Insufficient documentation

## 2018-05-20 DIAGNOSIS — Z95810 Presence of automatic (implantable) cardiac defibrillator: Secondary | ICD-10-CM | POA: Insufficient documentation

## 2018-05-20 DIAGNOSIS — J45909 Unspecified asthma, uncomplicated: Secondary | ICD-10-CM | POA: Insufficient documentation

## 2018-05-20 DIAGNOSIS — E785 Hyperlipidemia, unspecified: Secondary | ICD-10-CM | POA: Insufficient documentation

## 2018-05-20 DIAGNOSIS — I472 Ventricular tachycardia: Secondary | ICD-10-CM | POA: Diagnosis not present

## 2018-05-20 DIAGNOSIS — I480 Paroxysmal atrial fibrillation: Secondary | ICD-10-CM

## 2018-05-20 DIAGNOSIS — I5022 Chronic systolic (congestive) heart failure: Secondary | ICD-10-CM

## 2018-05-20 DIAGNOSIS — Z79899 Other long term (current) drug therapy: Secondary | ICD-10-CM | POA: Insufficient documentation

## 2018-05-20 DIAGNOSIS — M109 Gout, unspecified: Secondary | ICD-10-CM | POA: Diagnosis not present

## 2018-05-20 DIAGNOSIS — I4891 Unspecified atrial fibrillation: Secondary | ICD-10-CM | POA: Diagnosis not present

## 2018-05-20 DIAGNOSIS — N183 Chronic kidney disease, stage 3 (moderate): Secondary | ICD-10-CM | POA: Insufficient documentation

## 2018-05-20 LAB — BASIC METABOLIC PANEL
Anion gap: 10 (ref 5–15)
BUN: 77 mg/dL — ABNORMAL HIGH (ref 6–20)
CALCIUM: 9.3 mg/dL (ref 8.9–10.3)
CO2: 25 mmol/L (ref 22–32)
CREATININE: 1.98 mg/dL — AB (ref 0.61–1.24)
Chloride: 99 mmol/L (ref 98–111)
GFR calc Af Amer: 41 mL/min — ABNORMAL LOW (ref >60)
GFR calc non Af Amer: 36 mL/min — ABNORMAL LOW (ref >60)
GLUCOSE: 224 mg/dL — AB (ref 70–99)
Potassium: 3.7 mmol/L (ref 3.5–5.1)
Sodium: 134 mmol/L — ABNORMAL LOW (ref 135–145)

## 2018-05-20 NOTE — Patient Instructions (Signed)
Lab today  Your physician recommends that you schedule a follow-up appointment in: 4 months  

## 2018-05-20 NOTE — Progress Notes (Signed)
Patient ID: AXZEL Hutchinson, male   DOB: [birth year], 57 y.o.   MRN: [id number]    Advanced Heart Failure Clinic Note   PCP: Creola Corn EP: Hillis Range  HPI: Gary Hutchinson is a 57 y.o. male with a history of chronic systolic heart failure, ICM s/p ICD, HTN, DM II, OSA on CPAP, history of ventricular tachycardia status post AICD placement in 2009, atrial fibrillation and morbid obesity.   Admitted 6/1-03/13/14 for A/C HF and cardiogenic shock (co-ox 42%). Diuresed with IV lasix and milrinone which were weaned off.  He went into Afib with RVR and chemically cardioverted back to NSR with IV amiodarone. Placed on Xarelto. BB restarted 1/2 dose and held ACE-I. His blood sugars were elevated along with Hgb A1C and started on insulin. Discharge weight 344 lbs.   RHC (05/2014): Left sided filling pressures well compensated, mild pulm HTN with elevated right-sided pressures and mod/severely depressed CO. Discussion about trial of milrinone but patient wanted to hold off.  Admitted January 2017 with volume overload and cellulitis. Diuresed with IV lasix and required short term milrinone. Loaded on amio and had successful DC-CV on  10/17/2015.  Discharge weight was 307 pounds.   Admitted 2/18 with Influenza A/B. 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 Seen by EP and managed conservatively. Received IVF in hospital for sepsis but not diuresed back down afterward. Hydralazine and Imdur also stopped.   S/p successful DCCV 07/29/2017.  Today he returns for HF follow up. Overall feeling fine. Denies SOB/PND/Orthopnea. Appetite ok. No fever or chills. Walking 4000-5000 steps per day. Limited by knee pain. Weight at home 289-295  pounds. Taking metolazone about once a month. Continues to use CPAP at night. Taking all medications. Has been followed closely by [name] and adjusts diuretics based on ICM .   Optivol: Activity ~2 hours per day. No VT/Afib. Fluid index below threshold. (has  been up and down) Personally reviewed    05/03/12: EF 25-30%.  Grade 1 diastolic dysfunction.  Mild MR.  Mod dilated LA.   07/27/13: EF 40%, diff HK, MR mild, LA mild/mod dilated  03/2014: EF30-35%, grade II DD, mild MR, RV systolic fx mildly decreased 10/2014: EF 40%.  10/2015: EF ~40%. RV mildly dilated.  8/17 EF 25-35% 07/27/2017 EF  25- 30%   SH: Lives with son in [place]. Disabled. No ETOH or tobacco abuse  FH; Mother living: HT        Father deceased: was not part of his life so not sure health issues  Review of systems complete and found to be negative unless listed in HPI.    Past Medical History:  Diagnosis Date  . AICD (automatic cardioverter/defibrillator) present    Medtronic  . Alcohol abuse    now quit  . Anemia    iron defi  . Arthritis    "fingers, knees, some days all my joints ache" (11/16/2015)  . Asthma   . Cholecystitis   . Chronic systolic congestive heart failure (HCC)    a. RHC (02/2011) RA 31/27, RV 71/27, PA67/45, PCWP 46, PA 49%, Fick CO: 3.4 b. ECHO (03/2014) EF 30-35%, grade II DD, mild MR, RV poorly visualized appears mildly decreased c. RHC (05/2014) RA 13, RV 54/4/11, PA 60/22 (37), PCWP 17, Fick CO/CI: 4.4 / 1.9, Thermo CO/CI: 3.8 /1.6, PVR 5.2 WU, PA 57% and 59%  . Diverticulosis of colon   . Gout   . Hemorrhoids, internal   . Hyperlipidemia   .  Hypertension   . Morbid obesity (HCC)   . Nonischemic cardiomyopathy (HCC)    a. LHC (02/2011) normal coronaries  . OSA on CPAP   . Paroxysmal atrial fibrillation (HCC)   . Pneumonia 2000s X 1  . Pulmonary hypertension (HCC)   . Renal insufficiency   . Type II diabetes mellitus (HCC)   . Ventricular tachycardia [facility])    s/p MDT ICD implant    Current Outpatient Medications  Medication Sig Dispense Refill  . acetaminophen (TYLENOL) 500 MG tablet Take 500 mg by mouth every 6 (six) hours as needed for moderate pain or headache.     . albuterol (PROAIR HFA) 108 (90 BASE) MCG/ACT inhaler Inhale 2  puffs into the lungs every 6 (six) hours as needed for wheezing or shortness of breath. Reported on 02/06/2016    . allopurinol (ZYLOPRIM) 300 MG tablet Take 300 mg by mouth daily.    Marland Kitchen atorvastatin (LIPITOR) 40 MG tablet TAKE 1 TABLET (40 MG TOTAL) BY MOUTH DAILY. 90 tablet 2  . colchicine 0.6 MG tablet Take 0.6 mg by mouth daily as needed (for gout flares).     Marland Kitchen digoxin (LANOXIN) 0.25 MG tablet TAKE 1/2 TABLET EVERY OTHER DAY 45 tablet 3  . ENTRESTO 49-51 MG TAKE 1 TABLET BY MOUTH 2 (TWO) TIMES DAILY. 180 tablet 3  . Fluticasone-Salmeterol (ADVAIR DISKUS) 250-50 MCG/DOSE AEPB Inhale 1 puff into the lungs every 12 (twelve) hours as needed (for flares/shortness of breath). Reported on 02/06/2016    . hydrALAZINE (APRESOLINE) 25 MG tablet Take 1.5 tablets (37.5 mg total) by mouth 3 (three) times daily. 405 tablet 3  . insulin detemir (LEVEMIR) 100 UNIT/ML injection Inject 36 Units into the skin at bedtime.     . isosorbide mononitrate (IMDUR) 30 MG 24 hr tablet TAKE 1 TABLET (30 MG TOTAL) BY MOUTH 2 TIMES DAILY. 180 tablet 3  . linagliptin (TRADJENTA) 5 MG TABS tablet Take 1 tablet (5 mg total) by mouth daily. 30 tablet 3  . metolazone (ZAROXOLYN) 2.5 MG tablet Take 2.5 mg by mouth daily as needed (for edema). Reported on 02/06/2016    . montelukast (SINGULAIR) 10 MG tablet Take 10 mg by mouth daily as needed (for shortness of breath, wheezing, or flares).     . sotalol (BETAPACE) 80 MG tablet Take 1 tablet (80 mg total) by mouth 2 (two) times daily. 180 tablet 3  . spironolactone (ALDACTONE) 25 MG tablet Take 12.5 mg by mouth daily.     Marland Kitchen torsemide (DEMADEX) 20 MG tablet Take 20-40 mg by mouth See admin instructions. 40 mg in the AM and 20 mg in the PM     . XARELTO 20 MG TABS tablet TAKE 1 TABLET (20 MG TOTAL) BY MOUTH DAILY WITH SUPPER. 90 tablet 2   No current facility-administered medications for this encounter.    Vitals:   05/20/18 0954  BP: 133/62  Pulse: 64  SpO2: 100%  Weight: 134 kg  (295 lb 6 oz)   Wt Readings from Last 3 Encounters:  05/20/18 134 kg (295 lb 6 oz)  02/04/18 133.8 kg (295 lb)  02/01/18 134.7 kg (297 lb)    General:  Well appearing. No resp difficulty HEENT: normal anicteric Neck: supple. no JVD. Carotids 2+ bilat; no bruits. No lymphadenopathy or thryomegaly appreciated. Cor: PMI nondisplaced. Regular rate & rhythm. No rubs, gallops or murmurs. Lungs: clear no wheeze Abdomen: obese, soft, nontender, nondistended. No hepatosplenomegaly. No bruits or masses. Good bowel sounds. Extremities: no  cyanosis, clubbing, rash, edema Neuro: alert & oriented x 3, cranial nerves grossly intact. moves all 4 extremities w/o difficulty. Affect pleasant   ASSESSMENT & PLAN:   1) Chronic systolic HF: NICM s/p BiV Medtronic - Echo 07/2017 LVEF 25-30% -NYHA II. Volume status stable.  -Continue torsemide 40 mg/20 mg .   - Continue entresto 49-51 twice a day.  - Continue bisoprolol 5 mg daily. Dig level today.  - Continue digoxin 0.0625 mg every other day. - Continue hydralazine 37.5 mg TID - Continue imdur 30 mg BID.  2) CKD stage III - Followed by nephrology.  Check bmet.  3) HTN  - Meds as above.  4) OSA  - Continue cpap.  5) PAF/AFL-  S/P multiple DCCVs. Most recent 07/29/17  - Had episode of AFL with ICD shock in 2/18 at time of influenza -- Pt refused Ablation consideration.  - Continue xarelto 20 mg daily. Denies bleeding.  - Continue Sotalol per EP.  6) Obesity:   - Body mass index is 49.15 kg/m.  Discussed portion control. 7) Amiodarone thyroxicity   - Off Amiodarone. Completed Methimazole treatment per PCP.   - No change to current plan.    Follow up in 4 months     Tonye Becket, NP  05/20/2018 10:19 AM    Patient seen and examined with Tonye Becket, NP. We discussed all aspects of the encounter. I agree with the assessment and plan as stated above.   Doing well. NYHA II. Volume status up and down but down today. Doing very well with the help  of remote ICM management. BP ok. Check labs today. ICD interrogated personally in clinic. No VT/VF. Optivol as above.   Arvilla Meres, MD  10:50 AM

## 2018-05-25 NOTE — Progress Notes (Signed)
No ICM remote transmission received for 05/13/2018 and next ICM transmission scheduled for 06/14/2018.    

## 2018-05-29 ENCOUNTER — Other Ambulatory Visit (HOSPITAL_COMMUNITY): Payer: Self-pay | Admitting: Student

## 2018-06-10 ENCOUNTER — Other Ambulatory Visit: Payer: Self-pay | Admitting: Internal Medicine

## 2018-06-13 ENCOUNTER — Other Ambulatory Visit (HOSPITAL_COMMUNITY): Payer: Self-pay | Admitting: Internal Medicine

## 2018-06-14 ENCOUNTER — Ambulatory Visit (INDEPENDENT_AMBULATORY_CARE_PROVIDER_SITE_OTHER): Payer: Medicare Other

## 2018-06-14 DIAGNOSIS — I5022 Chronic systolic (congestive) heart failure: Secondary | ICD-10-CM

## 2018-06-14 DIAGNOSIS — Z95810 Presence of automatic (implantable) cardiac defibrillator: Secondary | ICD-10-CM | POA: Diagnosis not present

## 2018-06-15 NOTE — Progress Notes (Signed)
EPIC Encounter for ICM Monitoring  Patient Name: Gary Hutchinson is a 57 y.o. male Date: 06/15/2018 Primary Care Physican: Shon Baton, MD Primary Cardiologist:[name] Electrophysiologist: Allred Dry Weight:294lbs Bi-V Pacing: 96.7%      Heart Failure questions reviewed, pt asymptomatic but did have some weight gain during decreased impedance while on vacation.    Thoracic impedance normal.  Prescribed dosage: Torsemide 20 mg 2 tablets (40 mg total) twice a day. Metolazone 2.5 mg 1 tablet as needed.  Labs: 05/20/2018 Creatinine 1.98, BUN 77, Potassium 3.7, Sodium 134, EGFR 36-41 01/20/2018 Creatinine 1.76, BUN 56, Potassium 4.0, Sodium 136, EGFR 41-48 09/16/2017 Creatinine1.48, BUN33, Potassium4.7, [id number], [id number] 09/09/2017 Creatinine1.77, BUN52, Potassium3.8, Sodium138, [id number]  09/04/2017 Creatinine2.15, BUN75, Potassium3.9, Sodium136, [id number]  10/22/2018Creatinine 1.60, BUN46, Potassium3.6, [id number], [id number] 04/27/2017 Creatinine 1.82, BUN 47, Potassium 4.2, Sodium 137, EGFR 40-46 04/13/2017 Creatinine 1.60, BUN 42, Potassium 3.6, Sodium 138, EGFR 47-54 12/29/2016 Creatinine 1.16, BUN 21, Potassium 4.1, Sodium 139, EGFR >60 12/03/2016 Creatinine 1.54, BUN 34, Potassium 4.0, Sodium 136, EGFR 49-57  11/27/2016 Creatinine 1.38, BUN 26, Potassium 3.8, Sodium 138, EGFR 56->60  11/26/2016 Creatinine 1.97, BUN 44, Potassium 3.5, Sodium 138, EGFR 36-42  02/20/2018Creatinine 2.32, BUN 48, Potassium 3.5, Sodium 135, EGFR 30-35  Recommendations: No changes.  Encouraged to call for fluid symptoms.  Follow-up plan: ICM clinic phone appointment on 07/15/2018.    Office appointment 08/04/2018 with Chanetta Marshall, NP.  Copy of ICM check sent to Dr. Rayann Heman.   3 month ICM trend: 06/14/2018    1 Year ICM trend:       [name], RN 06/15/2018 10:16 AM

## 2018-07-01 ENCOUNTER — Emergency Department (HOSPITAL_COMMUNITY): Payer: Medicare Other

## 2018-07-01 ENCOUNTER — Encounter (HOSPITAL_COMMUNITY): Payer: Self-pay | Admitting: Obstetrics and Gynecology

## 2018-07-01 ENCOUNTER — Emergency Department (HOSPITAL_COMMUNITY)
Admission: EM | Admit: 2018-07-01 | Discharge: 2018-07-01 | Disposition: A | Discharge status: Home / Self Care | Payer: Medicare Other | Attending: Emergency Medicine | Admitting: Emergency Medicine

## 2018-07-01 ENCOUNTER — Other Ambulatory Visit: Payer: Self-pay

## 2018-07-01 DIAGNOSIS — E119 Type 2 diabetes mellitus without complications: Secondary | ICD-10-CM | POA: Diagnosis not present

## 2018-07-01 DIAGNOSIS — M25511 Pain in right shoulder: Secondary | ICD-10-CM | POA: Insufficient documentation

## 2018-07-01 DIAGNOSIS — M545 Low back pain, unspecified: Secondary | ICD-10-CM

## 2018-07-01 DIAGNOSIS — I509 Heart failure, unspecified: Secondary | ICD-10-CM | POA: Insufficient documentation

## 2018-07-01 DIAGNOSIS — Z79899 Other long term (current) drug therapy: Secondary | ICD-10-CM | POA: Diagnosis not present

## 2018-07-01 DIAGNOSIS — I10 Essential (primary) hypertension: Secondary | ICD-10-CM | POA: Insufficient documentation

## 2018-07-01 DIAGNOSIS — I4891 Unspecified atrial fibrillation: Secondary | ICD-10-CM | POA: Diagnosis not present

## 2018-07-01 DIAGNOSIS — Z95810 Presence of automatic (implantable) cardiac defibrillator: Secondary | ICD-10-CM | POA: Diagnosis not present

## 2018-07-01 DIAGNOSIS — I5022 Chronic systolic (congestive) heart failure: Secondary | ICD-10-CM | POA: Insufficient documentation

## 2018-07-01 DIAGNOSIS — M549 Dorsalgia, unspecified: Secondary | ICD-10-CM | POA: Diagnosis present

## 2018-07-01 MED ORDER — ACETAMINOPHEN 500 MG PO TABS
1000.0000 mg | ORAL_TABLET | Freq: Once | ORAL | Status: AC
  Administered 2018-07-01: 1000 mg via ORAL
  Filled 2018-07-01: qty 2

## 2018-07-01 MED ORDER — METHOCARBAMOL 500 MG PO TABS: 500.0000 mg | ORAL_TABLET | Freq: Two times a day (BID) | ORAL | 0 refills | Status: DC
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
## 2018-07-01 NOTE — Discharge Instructions (Signed)
The pain your experiencing is likely due to muscle strain, you may take tylenol and Robaxin as needed for pain management. Robaxin can cause drowsiness, do not take before driving. The muscle soreness should improve over the next week. Follow up with your family doctor in the next week for a recheck if you are still having symptoms. Return to ED if pain is worsening, you develop weakness or numbness of extremities, or new or concerning symptoms develop. °
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
## 2018-07-15 ENCOUNTER — Ambulatory Visit (INDEPENDENT_AMBULATORY_CARE_PROVIDER_SITE_OTHER): Payer: Medicare Other

## 2018-07-15 ENCOUNTER — Ambulatory Visit (INDEPENDENT_AMBULATORY_CARE_PROVIDER_SITE_OTHER): Payer: Medicare Other | Admitting: *Deleted

## 2018-07-15 DIAGNOSIS — Z95810 Presence of automatic (implantable) cardiac defibrillator: Secondary | ICD-10-CM

## 2018-07-15 DIAGNOSIS — I5022 Chronic systolic (congestive) heart failure: Secondary | ICD-10-CM

## 2018-07-15 DIAGNOSIS — I428 Other cardiomyopathies: Secondary | ICD-10-CM | POA: Diagnosis not present

## 2018-07-15 NOTE — Progress Notes (Signed)
Remote ICD transmission.   

## 2018-07-16 ENCOUNTER — Encounter: Payer: Self-pay | Admitting: Nurse Practitioner
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
## 2018-07-22 ENCOUNTER — Encounter: Payer: Self-pay | Admitting: Cardiology

## 2018-08-04 ENCOUNTER — Encounter: Payer: Medicare Other | Admitting: Nurse Practitioner

## 2018-08-18 ENCOUNTER — Encounter: Payer: Self-pay | Admitting: Nurse Practitioner

## 2018-08-18 ENCOUNTER — Ambulatory Visit (INDEPENDENT_AMBULATORY_CARE_PROVIDER_SITE_OTHER): Payer: Medicare Other | Admitting: Nurse Practitioner

## 2018-08-18 VITALS — BP 118/62 | HR 73 | Ht 65.0 in | Wt 298.0 lb

## 2018-08-18 DIAGNOSIS — I472 Ventricular tachycardia, unspecified: Secondary | ICD-10-CM

## 2018-08-18 DIAGNOSIS — I5022 Chronic systolic (congestive) heart failure: Secondary | ICD-10-CM

## 2018-08-18 DIAGNOSIS — I480 Paroxysmal atrial fibrillation: Secondary | ICD-10-CM | POA: Diagnosis not present

## 2018-08-18 LAB — CUP PACEART INCLINIC DEVICE CHECK
Date Time Interrogation Session: 20191113101957
Implantable Lead Implant Date: 20090901
Implantable Lead Implant Date: 20170210
Implantable Lead Location: 753858
Implantable Lead Location: 753859
Implantable Lead Model: 4598
Implantable Pulse Generator Implant Date: 20170210
MDC IDC LEAD IMPLANT DT: 20170210
MDC IDC LEAD LOCATION: 753860

## 2018-08-18 LAB — BASIC METABOLIC PANEL
BUN/Creatinine Ratio: 29 — ABNORMAL HIGH (ref 9–20)
BUN: 52 mg/dL — AB (ref 6–24)
CALCIUM: 9.5 mg/dL (ref 8.7–10.2)
CHLORIDE: 96 mmol/L (ref 96–106)
CO2: 24 mmol/L (ref 20–29)
CREATININE: 1.77 mg/dL — AB (ref 0.76–1.27)
GFR calc Af Amer: 48 mL/min/{1.73_m2} — ABNORMAL LOW (ref >59)
GFR calc non Af Amer: 42 mL/min/{1.73_m2} — ABNORMAL LOW (ref >59)
GLUCOSE: 275 mg/dL — AB (ref 65–99)
Potassium: 4.3 mmol/L (ref 3.5–5.2)
Sodium: 137 mmol/L (ref 134–144)

## 2018-08-18 LAB — MAGNESIUM: MAGNESIUM: 2.4 mg/dL — AB (ref 1.6–2.3)

## 2018-08-18 NOTE — Progress Notes (Signed)
Electrophysiology Office Note Date: 08/18/2018  ID:  Gary Hutchinson, DOB 01-Mar-1961, MRN [id number]  PCP: Gary Corn, Hutchinson Primary Cardiologist: [name] Electrophysiologist: Allred  CC: Routine ICD follow-up  Gary Hutchinson is a 57 y.o. male seen today for Dr [name].  He presents today for routine electrophysiology followup.  Since last being seen in our clinic, the patient reports doing relatively well. He denies chest pain, palpitations, dyspnea, PND, orthopnea, nausea, vomiting, dizziness, syncope, edema, weight gain, or early satiety.  He has not had ICD shocks.   Device History: MDT single chamber ICD implanted 2009 for NICM; upgrade to CRTD 2017 History of appropriate therapy: Yes History of AAD therapy: Yes - Sotalol for AF   Past Medical History:  Diagnosis Date  . AICD (automatic cardioverter/defibrillator) present    Medtronic  . Alcohol abuse    now quit  . Anemia    iron defi  . Arthritis    "fingers, knees, some days all my joints ache" (11/16/2015)  . Asthma   . Cholecystitis   . Chronic systolic congestive heart failure (HCC)    a. RHC (02/2011) RA 31/27, RV 71/27, PA67/45, PCWP 46, PA 49%, Fick CO: 3.4 b. ECHO (03/2014) EF 30-35%, grade II DD, mild MR, RV poorly visualized appears mildly decreased c. RHC (05/2014) RA 13, RV 54/4/11, PA 60/22 (37), PCWP 17, Fick CO/CI: 4.4 / 1.9, Thermo CO/CI: 3.8 /1.6, PVR 5.2 WU, PA 57% and 59%  . Diverticulosis of colon   . Gout   . Hemorrhoids, internal   . Hyperlipidemia   . Hypertension   . Morbid obesity (HCC)   . Nonischemic cardiomyopathy (HCC)    a. LHC (02/2011) normal coronaries  . OSA on CPAP   . Paroxysmal atrial fibrillation (HCC)   . Pneumonia 2000s X 1  . Pulmonary hypertension (HCC)   . Renal insufficiency   . Type II diabetes mellitus (HCC)   . Ventricular tachycardia [facility])    s/p MDT ICD implant   Past Surgical History:  Procedure Laterality Date  . CARDIAC CATHETERIZATION  03/08/2004   EF 25-30%   . CARDIOVERSION N/A 10/17/2015   Procedure: CARDIOVERSION;  Surgeon: Gary Hutchinson;  Location: [facility] ENDOSCOPY;  Service: Cardiovascular;  Laterality: N/A;  . EP IMPLANTABLE DEVICE N/A 11/16/2015   Procedure: BiVI Upgrade;  Surgeon: Gary Hutchinson;  Medtronic [address] [place]  . INSERTION OF ICD  06/2008   ICD- Medtronic   . RIGHT HEART CATHETERIZATION N/A 05/23/2014   Procedure: RIGHT HEART CATH;  Surgeon: Gary Hutchinson;  Location: [facility] CATH LAB;  Service: Cardiovascular;  Laterality: N/A;  . TRANSTHORACIC ECHOCARDIOGRAM  05/27/2008   EF 30-35%  . US ECHOCARDIOGRAPHY  08/22/2008   EF 30-35%    Current Outpatient Medications  Medication Sig Dispense Refill  . acetaminophen (TYLENOL) 500 MG tablet Take 500 mg by mouth every 6 (six) hours as needed for moderate pain or headache.     . albuterol (PROAIR HFA) 108 (90 BASE) MCG/ACT inhaler Inhale 2 puffs into the lungs every 6 (six) hours as needed for wheezing or shortness of breath. Reported on 02/06/2016    . allopurinol (ZYLOPRIM) 300 MG tablet Take 300 mg by mouth daily.    Marland Kitchen atorvastatin (LIPITOR) 40 MG tablet TAKE 1 TABLET (40 MG TOTAL) BY MOUTH DAILY. 90 tablet 2  . colchicine 0.6 MG tablet Take 0.6 mg by mouth daily as needed (for gout flares).     Marland Kitchen digoxin (LANOXIN)  0.25 MG tablet TAKE 1/2 TABLET EVERY OTHER DAY 45 tablet 3  . ENTRESTO 49-51 MG TAKE 1 TABLET BY MOUTH 2 (TWO) TIMES DAILY. 180 tablet 3  . Fluticasone-Salmeterol (ADVAIR DISKUS) 250-50 MCG/DOSE AEPB Inhale 1 puff into the lungs every 12 (twelve) hours as needed (for flares/shortness of breath). Reported on 02/06/2016    . hydrALAZINE (APRESOLINE) 25 MG tablet TAKE 1.5 TABLETS (37.5 MG TOTAL) BY MOUTH 3 (THREE) TIMES DAILY. 405 tablet 3  . insulin detemir (LEVEMIR) 100 UNIT/ML injection Inject 36 Units into the skin at bedtime.     . isosorbide mononitrate (IMDUR) 30 MG 24 hr tablet TAKE 1 TABLET (30 MG TOTAL) BY MOUTH 2 TIMES DAILY. 180 tablet 3  . linagliptin  (TRADJENTA) 5 MG TABS tablet Take 1 tablet (5 mg total) by mouth daily. 30 tablet 3  . methocarbamol (ROBAXIN) 500 MG tablet Take 1 tablet (500 mg total) by mouth 2 (two) times daily. 20 tablet 0  . metolazone (ZAROXOLYN) 2.5 MG tablet Take 2.5 mg by mouth daily as needed (for edema). Reported on 02/06/2016    . montelukast (SINGULAIR) 10 MG tablet Take 10 mg by mouth daily as needed (for shortness of breath, wheezing, or flares).     . sotalol (BETAPACE) 80 MG tablet Take 1 tablet (80 mg total) by mouth 2 (two) times daily. 180 tablet 3  . spironolactone (ALDACTONE) 25 MG tablet Take 12.5 mg by mouth daily.     Marland Kitchen torsemide (DEMADEX) 20 MG tablet Take 20-40 mg by mouth See admin instructions. 40 mg in the AM and 20 mg in the PM     . XARELTO 20 MG TABS tablet TAKE 1 TABLET (20 MG TOTAL) BY MOUTH DAILY WITH SUPPER. 90 tablet 2   No current facility-administered medications for this visit.     Allergies:   Patient has no known allergies.   Social History: Social History   Socioeconomic History  . Marital status: Single    Spouse name: Not on file  . Number of children: 4  . Years of education: Not on file  . Highest education level: Not on file  Occupational History    Employer: DISABLED  Social Needs  . Financial resource strain: Not on file  . Food insecurity:    Worry: Not on file    Inability: Not on file  . Transportation needs:    Medical: Not on file    Non-medical: Not on file  Tobacco Use  . Smoking status: Never Smoker  . Smokeless tobacco: Never Used  Substance and Sexual Activity  . Alcohol use: Yes    Comment: former heavy ETOH, quit in "the late '90s"  . Drug use: No  . Sexual activity: Not Currently  Lifestyle  . Physical activity:    Days per week: Not on file    Minutes per session: Not on file  . Stress: Not on file  Relationships  . Social connections:    Talks on phone: Not on file    Gets together: Not on file    Attends religious service: Not on  file    Active member of club or organization: Not on file    Attends meetings of clubs or organizations: Not on file    Relationship status: Not on file  . Intimate partner violence:    Fear of current or ex partner: Not on file    Emotionally abused: Not on file    Physically abused: Not on file  Forced sexual activity: Not on file  Other Topics Concern  . Not on file  Social History Narrative   disabled    Family History: Family History  Problem Relation Age of Onset  . Cancer Father   . Diabetes Unknown        grandparents  . Hypertension Mother   . CAD Unknown        No family history    Review of Systems: All other systems reviewed and are otherwise negative except as noted above.   Physical Exam: VS:  BP 118/62   Pulse 73   Ht 5\' 5"  (1.651 m)   Wt 298 lb (135.2 kg)   SpO2 99%   BMI 49.59 kg/m  , BMI Body mass index is 49.59 kg/m.  GEN- The patient is well appearing, alert and oriented x 3 today.   HEENT: normocephalic, atraumatic; sclera clear, conjunctiva pink; hearing intact; oropharynx clear; neck supple, no JVP Lymph- no cervical lymphadenopathy Lungs- Clear to ausculation bilaterally, normal work of breathing.  No wheezes, rales, rhonchi Heart- Regular rate and rhythm, no murmurs, rubs or gallops, PMI not laterally displaced GI- soft, non-tender, non-distended, bowel sounds present, no hepatosplenomegaly Extremities- no clubbing, cyanosis, or edema; DP/PT/radial pulses 2+ bilaterally MS- no significant deformity or atrophy Skin- warm and dry, no rash or lesion; ICD pocket well healed Psych- euthymic mood, full affect Neuro- strength and sensation are intact  ICD interrogation- reviewed in detail today,  See PACEART report  EKG:  EKG is ordered today. The ekg ordered today shows sinus rhythm with V pacing  Recent Labs: 09/10/2017: B Natriuretic Peptide 186.7 01/20/2018: Hemoglobin 12.2; Magnesium 2.4; Platelets 177 05/20/2018: BUN 77; Creatinine,  Ser 1.98; Potassium 3.7; Sodium 134   Wt Readings from Last 3 Encounters:  08/18/18 298 lb (135.2 kg)  07/01/18 296 lb 9.6 oz (134.5 kg)  05/20/18 295 lb 6 oz (134 kg)     Other studies Reviewed: Additional studies/ records that were reviewed today include: Dr [name] and Dr [name]'s office notes   Assessment and Plan:  1.  Chronic systolic dysfunction euvolemic today Stable on an appropriate medical regimen Normal ICD function See Pace Art report No changes today  2.  Paroxysmal atrial fibrillation/atypical atrial flutter Burden by device interrogation 0% Continue Xarelto for CHADS2VASC of 3 Continue Sotalol. QTc stable today BMET, Mg today  Lifestyle modification reviewed today  3.  Morbid obesity Body mass index is 49.59 kg/m. Weight loss encouraged. Nutrition flyer given today  4.  VT No recent recurrence Continue current therapy  5.  OSA Compliant with Bipap   Current medicines are reviewed at length with the patient today.   The patient does not have concerns regarding his medicines.  The following changes were made today:  none  Labs/ tests ordered today include: BMET, Mg Orders Placed This Encounter  Procedures  . Basic metabolic panel  . Magnesium  . CUP PACEART INCLINIC DEVICE CHECK  . EKG 12-Lead     Disposition:   Follow up with [facility], me in 6 months, AHF as scheduled    Signed, Gypsy Balsam, NP 08/18/2018 10:42 AM  [facility] [address] [place] [address] [phone] (office) [phone] (fax)

## 2018-08-18 NOTE — Patient Instructions (Addendum)
Medication Instructions:   If you need a refill on your cardiac medications before your next appointment, please call your pharmacy.   Lab work: BMET AND MAG TODAY   If you have labs (blood work) drawn today and your tests are completely normal, you will receive your results only by: Marland Kitchen MyChart Message (if you have MyChart) OR . A paper copy in the mail If you have any lab test that is abnormal or we need to change your treatment, we will call you to review the results.  Testing/Procedures: NONE ORDERED  TODAY    Follow-Up:  At [facility], you and your health needs are our priority.  As part of our continuing mission to provide you with exceptional heart care, we have created designated Provider Care Teams.  These Care Teams include your primary Cardiologist (physician) and Advanced Practice Providers (APPs -  Physician Assistants and Nurse Practitioners) who all work together to provide you with the care you need, when you need it. You will need a follow up appointment in 6 months. With Advanced Practice Providers on your designated Care Gypsy Balsam, NP  Any Other Special Instructions Will Be Listed Below (If Applicable).

## 2018-08-23 ENCOUNTER — Telehealth: Payer: Self-pay

## 2018-08-23 NOTE — Telephone Encounter (Signed)
-----   Message from Marily Lente, NP sent at 08/20/2018  5:32 PM EST ----- All values are stable for patient  Medication changes / Follow up labs / Other changes or recommendations:   none Gypsy Balsam, NP 08/20/2018 5:32 PM

## 2018-08-23 NOTE — Telephone Encounter (Signed)
Notes recorded by Sigurd Sos, RN on 08/23/2018 at 9:03 AM EST The patient has been notified of the result and verbalized understanding. All questions (if any) were answered. Sigurd Sos, RN 08/23/2018 9:03 AM

## 2018-08-24 LAB — CUP PACEART REMOTE DEVICE CHECK
Battery Remaining Longevity: 72 mo
Battery Voltage: 2.99 V
Brady Statistic AS VS Percent: 1.54 %
Date Time Interrogation Session: 20191010052203
HIGH POWER IMPEDANCE MEASURED VALUE: 90 Ohm
Implantable Lead Implant Date: 20170210
Implantable Lead Location: 753859
Implantable Lead Model: 4598
Implantable Pulse Generator Implant Date: 20170210
Lead Channel Impedance Value: 342 Ohm
Lead Channel Impedance Value: 418 Ohm
Lead Channel Impedance Value: 418 Ohm
Lead Channel Impedance Value: 532 Ohm
Lead Channel Impedance Value: 779 Ohm
Lead Channel Impedance Value: 931 Ohm
Lead Channel Impedance Value: 988 Ohm
Lead Channel Pacing Threshold Amplitude: 0.625 V
Lead Channel Pacing Threshold Pulse Width: 0.4 ms
Lead Channel Pacing Threshold Pulse Width: 0.4 ms
Lead Channel Setting Pacing Amplitude: 1.5 V
Lead Channel Setting Pacing Amplitude: 2 V
Lead Channel Setting Pacing Pulse Width: 0.4 ms
Lead Channel Setting Sensing Sensitivity: 0.3 mV
MDC IDC LEAD IMPLANT DT: 20090901
MDC IDC LEAD IMPLANT DT: 20170210
MDC IDC LEAD LOCATION: 753858
MDC IDC LEAD LOCATION: 753860
MDC IDC MSMT LEADCHNL LV IMPEDANCE VALUE: 418 Ohm
MDC IDC MSMT LEADCHNL LV IMPEDANCE VALUE: 475 Ohm
MDC IDC MSMT LEADCHNL LV IMPEDANCE VALUE: 608 Ohm
MDC IDC MSMT LEADCHNL LV IMPEDANCE VALUE: 722 Ohm
MDC IDC MSMT LEADCHNL LV IMPEDANCE VALUE: 988 Ohm
MDC IDC MSMT LEADCHNL LV PACING THRESHOLD AMPLITUDE: 0.875 V
MDC IDC MSMT LEADCHNL RA IMPEDANCE VALUE: 456 Ohm
MDC IDC MSMT LEADCHNL RA SENSING INTR AMPL: 3.375 mV
MDC IDC MSMT LEADCHNL RA SENSING INTR AMPL: 3.375 mV
MDC IDC MSMT LEADCHNL RV PACING THRESHOLD AMPLITUDE: 0.875 V
MDC IDC MSMT LEADCHNL RV PACING THRESHOLD PULSEWIDTH: 0.4 ms
MDC IDC MSMT LEADCHNL RV SENSING INTR AMPL: 11.5 mV
MDC IDC MSMT LEADCHNL RV SENSING INTR AMPL: 11.5 mV
MDC IDC SET LEADCHNL LV PACING AMPLITUDE: 2 V
MDC IDC SET LEADCHNL LV PACING PULSEWIDTH: 0.4 ms
MDC IDC STAT BRADY AP VP PERCENT: 5.2 %
MDC IDC STAT BRADY AP VS PERCENT: 0.1 %
MDC IDC STAT BRADY AS VP PERCENT: 93.16 %
MDC IDC STAT BRADY RA PERCENT PACED: 5.27 %
MDC IDC STAT BRADY RV PERCENT PACED: 11.15 %

## 2018-09-06 ENCOUNTER — Ambulatory Visit (INDEPENDENT_AMBULATORY_CARE_PROVIDER_SITE_OTHER): Payer: Medicare Other

## 2018-09-06 DIAGNOSIS — Z95810 Presence of automatic (implantable) cardiac defibrillator: Secondary | ICD-10-CM

## 2018-09-06 DIAGNOSIS — I5022 Chronic systolic (congestive) heart failure: Secondary | ICD-10-CM

## 2018-09-06 NOTE — Progress Notes (Signed)
EPIC Encounter for ICM Monitoring  Patient Name: Gary Hutchinson is a 57 y.o. male Date: 09/06/2018 Primary Care Physican: Shon Baton, MD Primary Cardiologist:[name] Electrophysiologist: Allred Bi-V Pacing: 97%  Last Weight: 294lbs Today's Weight: 296 lbs        Heart Failure questions reviewed, pt asymptomatic.   Thoracic impedance normal.   Prescribed: Torsemide 20 mg 2 tablets (40 mg total)every morning and 1 tablet every evening. Metolazone 2.5 mg 1 tablet as needed.  Labs: 08/18/2018 Creatinine 1.77, BUN 52, Potassium 4.3, Sodium 137, eGFR 42-48 05/20/2018 Creatinine 1.98, BUN 77, Potassium 3.7, Sodium 134, EGFR 36-41 01/20/2018 Creatinine 1.76, BUN 56, Potassium 4.0, Sodium 136, EGFR 41-48  Recommendations: No changes.   Encouraged to call for fluid symptoms.  Follow-up plan: ICM clinic phone appointment on 10/07/2018.     Copy of ICM check sent to Dr. Rayann Heman.   3 month ICM trend: 09/06/2018    1 Year ICM trend:       [name], RN 09/06/2018 1:54 PM

## 2018-09-08 ENCOUNTER — Other Ambulatory Visit (HOSPITAL_COMMUNITY): Payer: Self-pay | Admitting: Pharmacist

## 2018-09-08 MED ORDER — ISOSORBIDE MONONITRATE ER 30 MG PO TB24: ORAL_TABLET | ORAL | 3 refills | Status: DC

## 2018-09-22 ENCOUNTER — Encounter (HOSPITAL_COMMUNITY): Payer: Medicare Other

## 2018-10-07 ENCOUNTER — Ambulatory Visit (INDEPENDENT_AMBULATORY_CARE_PROVIDER_SITE_OTHER): Payer: Medicare Other

## 2018-10-07 DIAGNOSIS — I5022 Chronic systolic (congestive) heart failure: Secondary | ICD-10-CM

## 2018-10-07 DIAGNOSIS — Z95810 Presence of automatic (implantable) cardiac defibrillator: Secondary | ICD-10-CM

## 2018-10-07 NOTE — Progress Notes (Signed)
Patient ID: Gary Hutchinson, male   DOB: [birth year], 58 y.o.   MRN: [id number]    Advanced Heart Failure Clinic Note   PCP: Creola Corn EP: Hillis Range  HPI: Gary Hutchinson is a 58 y.o. male with a history of chronic systolic heart failure, ICM s/p ICD, HTN, DM II, OSA on CPAP, history of ventricular tachycardia status post AICD placement in 2009, atrial fibrillation and morbid obesity.   Admitted 6/1-03/13/14 for A/C HF and cardiogenic shock (co-ox 42%). Diuresed with IV lasix and milrinone which were weaned off.  He went into Afib with RVR and chemically cardioverted back to NSR with IV amiodarone. Placed on Xarelto. BB restarted 1/2 dose and held ACE-I. His blood sugars were elevated along with Hgb A1C and started on insulin. Discharge weight 344 lbs.   RHC (05/2014): Left sided filling pressures well compensated, mild pulm HTN with elevated right-sided pressures and mod/severely depressed CO. Discussion about trial of milrinone but patient wanted to hold off.  Admitted January 2017 with volume overload and cellulitis. Diuresed with IV lasix and required short term milrinone. Loaded on amio and had successful DC-CV on  10/17/2015.  Discharge weight was 307 pounds.   Admitted 2/18 with Influenza A/B. 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 Seen by EP and managed conservatively. Received IVF in hospital for sepsis but not diuresed back down afterward. Hydralazine and Imdur also stopped.   S/p successful DCCV 07/29/2017.  Today he returns for HF follow up. Overall doing really well. Denies SOB, orthopnea, or PND. Walking 5,000 steps daily. Activity limited by knee pain. Denies SOB with stairs and hills. Wears CPAP qHS. Energy level and appetite okay. No CP, palpitations,  or dizziness. He is planning to start plant based diet to try to lose weight. Takes metolazone about once/month, occasionally twice/month as directed by [name]. Good UOP with torsemide. Weights 292-294  lbs. Tries to limit fluid and salt. Had a hard time over the holidays. SBP 110s at home. Forgot hydralazine last night and this morning, but says he rarely misses meds.   Optivol: Frequent crossovers with thoracic impedence, currently over threshold. Active about 2 hours daily. Two episodes of AF, one in Nov, one in Dec <1 minute long. No VT/VF.    05/03/12: EF 25-30%.  Grade 1 diastolic dysfunction.  Mild MR.  Mod dilated LA.   07/27/13: EF 40%, diff HK, MR mild, LA mild/mod dilated  03/2014: EF30-35%, grade II DD, mild MR, RV systolic fx mildly decreased 10/2014: EF 40%.  10/2015: EF ~40%. RV mildly dilated.  8/17 EF 25-35% 07/27/2017 EF  25- 30%   SH: Lives with son in [place]. Disabled. No ETOH or tobacco abuse  FH; Mother living: HT        Father deceased: was not part of his life so not sure health issues  Review of systems complete and found to be negative unless listed in HPI.   Past Medical History:  Diagnosis Date  . AICD (automatic cardioverter/defibrillator) present    Medtronic  . Alcohol abuse    now quit  . Anemia    iron defi  . Arthritis    "fingers, knees, some days all my joints ache" (11/16/2015)  . Asthma   . Cholecystitis   . Chronic systolic congestive heart failure (HCC)    a. RHC (02/2011) RA 31/27, RV 71/27, PA67/45, PCWP 46, PA 49%, Fick CO: 3.4 b. ECHO (03/2014) EF 30-35%, grade II DD, mild  MR, RV poorly visualized appears mildly decreased c. RHC (05/2014) RA 13, RV 54/4/11, PA 60/22 (37), PCWP 17, Fick CO/CI: 4.4 / 1.9, Thermo CO/CI: 3.8 /1.6, PVR 5.2 WU, PA 57% and 59%  . Diverticulosis of colon   . Gout   . Hemorrhoids, internal   . Hyperlipidemia   . Hypertension   . Morbid obesity (HCC)   . Nonischemic cardiomyopathy (HCC)    a. LHC (02/2011) normal coronaries  . OSA on CPAP   . Paroxysmal atrial fibrillation (HCC)   . Pneumonia 2000s X 1  . Pulmonary hypertension (HCC)   . Renal insufficiency   . Type II diabetes mellitus (HCC)   .  Ventricular tachycardia [facility](HCC)    s/p MDT ICD implant    Current Outpatient Medications  Medication Sig Dispense Refill  . acetaminophen (TYLENOL) 500 MG tablet Take 500 mg by mouth every 6 (six) hours as needed for moderate pain or headache.     . albuterol (PROAIR HFA) 108 (90 BASE) MCG/ACT inhaler Inhale 2 puffs into the lungs every 6 (six) hours as needed for wheezing or shortness of breath. Reported on 02/06/2016    . allopurinol (ZYLOPRIM) 300 MG tablet Take 300 mg by mouth daily.    Marland Kitchen. atorvastatin (LIPITOR) 40 MG tablet TAKE 1 TABLET (40 MG TOTAL) BY MOUTH DAILY. 90 tablet 2  . colchicine 0.6 MG tablet Take 0.6 mg by mouth daily as needed (for gout flares).     Marland Kitchen. digoxin (LANOXIN) 0.25 MG tablet TAKE 1/2 TABLET EVERY OTHER DAY 45 tablet 3  . ENTRESTO 49-51 MG TAKE 1 TABLET BY MOUTH 2 (TWO) TIMES DAILY. 180 tablet 3  . Fluticasone-Salmeterol (ADVAIR DISKUS) 250-50 MCG/DOSE AEPB Inhale 1 puff into the lungs every 12 (twelve) hours as needed (for flares/shortness of breath). Reported on 02/06/2016    . insulin detemir (LEVEMIR) 100 UNIT/ML injection Inject 36 Units into the skin at bedtime.     . isosorbide mononitrate (IMDUR) 30 MG 24 hr tablet TAKE 1 TABLET (30 MG TOTAL) BY MOUTH 2 TIMES DAILY. 180 tablet 3  . linagliptin (TRADJENTA) 5 MG TABS tablet Take 1 tablet (5 mg total) by mouth daily. 30 tablet 3  . methocarbamol (ROBAXIN) 500 MG tablet Take 1 tablet (500 mg total) by mouth 2 (two) times daily. 20 tablet 0  . metolazone (ZAROXOLYN) 2.5 MG tablet Take 2.5 mg by mouth daily as needed (for edema). Reported on 02/06/2016    . montelukast (SINGULAIR) 10 MG tablet Take 10 mg by mouth daily as needed (for shortness of breath, wheezing, or flares).     . sotalol (BETAPACE) 80 MG tablet Take 1 tablet (80 mg total) by mouth 2 (two) times daily. 180 tablet 3  . spironolactone (ALDACTONE) 25 MG tablet Take 12.5 mg by mouth daily.     Marland Kitchen. torsemide (DEMADEX) 20 MG tablet Take 20-40 mg by mouth See  admin instructions. 40 mg in the AM and 20 mg in the PM     . XARELTO 20 MG TABS tablet TAKE 1 TABLET (20 MG TOTAL) BY MOUTH DAILY WITH SUPPER. 90 tablet 2  . hydrALAZINE (APRESOLINE) 25 MG tablet TAKE 1.5 TABLETS (37.5 MG TOTAL) BY MOUTH 3 (THREE) TIMES DAILY. 405 tablet 3   No current facility-administered medications for this encounter.    Vitals:   10/11/18 1055  BP: 134/88  Pulse: 91  SpO2: 99%  Weight: 135.2 kg (298 lb)   Wt Readings from Last 3 Encounters:  10/11/18 135.2  kg (298 lb)  08/18/18 135.2 kg (298 lb)  07/01/18 134.5 kg (296 lb 9.6 oz)    General: Well appearing. No resp difficulty. Obese. HEENT: Normal Neck: Supple. Thick neck. No JVD. Carotids 2+ bilat; no bruits. No thyromegaly or nodule noted. Cor: PMI nondisplaced. RRR, No M/G/R noted Lungs: CTAB, normal effort. Abdomen: Soft, non-tender, non-distended, no HSM. No bruits or masses. +BS  Extremities: No cyanosis, clubbing, or rash. R and LLE no edema.  Neuro: Alert & orientedx3, cranial nerves grossly intact. moves all 4 extremities w/o difficulty. Affect pleasant   ASSESSMENT & PLAN:   1) Chronic systolic HF: NICM s/p BiV Medtronic - Echo 07/2017 LVEF 25-30% - NYHA II. Volume status stable on exam.  - Continue torsemide 40 mg/20 mg.  - Continue entresto 49-51 twice a day.  - Continue bisoprolol 5 mg daily.  - Continue digoxin 0.0625 mg every other day. Dig level today - Continue hydralazine 37.5 mg TID. Will not increase today since has not yet taken and SBP stable 110s at home.  - Continue imdur 30 mg BID.  - Discussed limiting fluid and salt intake.  2) CKD stage III - Followed by nephrology.  - BMET today 3) HTN  - Manage with HF meds. 4) OSA  - Continue BiPAP. He is compliant.  5) PAF/AFL-  S/P multiple DCCVs. Most recent 07/29/17  - Had episode of AFL with ICD shock in 2/18 at time of influenza - Pt refused Ablation consideration.  - Continue xarelto 20 mg daily. Denies bleeding.  -  Continue Sotalol per EP. Check K and mag today.  - Two short episodes of AF, one in November, one in mid-December. Regular on exam today.  6) Obesity:   - Body mass index is 49.59 kg/m.  - Discussed portion control. He is going to try plant based diet.  7) Amiodarone thyroxicity   - Off Amiodarone. Completed Methimazole treatment per PCP.   - No change.   He is doing well. Follow up in 4 months with echo BMET, dig level, mag   Alford Highland, NP  10/11/2018 11:05 AM   Greater than 50% of the 25 minute visit was spent in counseling/coordination of care regarding disease state education, salt/fluid restriction, sliding scale diuretics, and medication compliance.

## 2018-10-08 NOTE — Progress Notes (Signed)
EPIC Encounter for ICM Monitoring  Patient Name: Gary Hutchinson is a 58 y.o. male Date: 10/08/2018 Primary Care Physican: Shon Baton, MD Primary Cardiologist:[name] Electrophysiologist: Allred Bi-V Pacing: 97.4%  Last Weight: 296lbs Today's Weight: unknown      Transmission received.   Thoracic impedance normal.  Prescribed dosage: Torsemide 20 mg 2 tablets (40 mg total)every morning and 1 tablet every evening. Metolazone 2.5 mg 1 tablet as needed.  Labs: 08/18/2018 Creatinine 1.77, BUN 52, Potassium 4.3, Sodium 137, eGFR 42-48 05/20/2018 Creatinine 1.98, BUN 77, Potassium 3.7, Sodium 134, EGFR 36-41 01/20/2018 Creatinine 1.76, BUN 56, Potassium 4.0, Sodium 136, EGFR 41-48  Recommendations: None.  Follow-up plan: ICM clinic phone appointment on 11/08/2018.  Office appointment scheduled 10/11/2018 with HF clinic NP/PA.  Copy of ICM check sent to Dr. Rayann Heman.   3 month ICM trend: 10/07/2018    1 Year ICM trend:       [name], RN 10/08/2018 8:42 AM

## 2018-10-11 ENCOUNTER — Other Ambulatory Visit: Payer: Self-pay

## 2018-10-11 ENCOUNTER — Encounter (HOSPITAL_COMMUNITY): Payer: Self-pay

## 2018-10-11 ENCOUNTER — Ambulatory Visit (HOSPITAL_COMMUNITY)
Admission: RE | Admit: 2018-10-11 | Discharge: 2018-10-11 | Disposition: A | Discharge status: Home / Self Care | Payer: Medicare Other | Source: Ambulatory Visit | Attending: Internal Medicine | Admitting: Internal Medicine

## 2018-10-11 VITALS — BP 134/88 | HR 91 | Wt 298.0 lb

## 2018-10-11 DIAGNOSIS — E1122 Type 2 diabetes mellitus with diabetic chronic kidney disease: Secondary | ICD-10-CM | POA: Diagnosis not present

## 2018-10-11 DIAGNOSIS — I272 Pulmonary hypertension, unspecified: Secondary | ICD-10-CM | POA: Insufficient documentation

## 2018-10-11 DIAGNOSIS — Z7901 Long term (current) use of anticoagulants: Secondary | ICD-10-CM | POA: Insufficient documentation

## 2018-10-11 DIAGNOSIS — M109 Gout, unspecified: Secondary | ICD-10-CM | POA: Insufficient documentation

## 2018-10-11 DIAGNOSIS — Z6841 Body Mass Index (BMI) 40.0 and over, adult: Secondary | ICD-10-CM | POA: Diagnosis not present

## 2018-10-11 DIAGNOSIS — I130 Hypertensive heart and chronic kidney disease with heart failure and stage 1 through stage 4 chronic kidney disease, or unspecified chronic kidney disease: Secondary | ICD-10-CM | POA: Diagnosis present

## 2018-10-11 DIAGNOSIS — Z95810 Presence of automatic (implantable) cardiac defibrillator: Secondary | ICD-10-CM | POA: Diagnosis not present

## 2018-10-11 DIAGNOSIS — I428 Other cardiomyopathies: Secondary | ICD-10-CM | POA: Diagnosis not present

## 2018-10-11 DIAGNOSIS — I480 Paroxysmal atrial fibrillation: Secondary | ICD-10-CM | POA: Insufficient documentation

## 2018-10-11 DIAGNOSIS — I10 Essential (primary) hypertension: Secondary | ICD-10-CM

## 2018-10-11 DIAGNOSIS — Z79899 Other long term (current) drug therapy: Secondary | ICD-10-CM | POA: Diagnosis not present

## 2018-10-11 DIAGNOSIS — E785 Hyperlipidemia, unspecified: Secondary | ICD-10-CM | POA: Insufficient documentation

## 2018-10-11 DIAGNOSIS — Z9889 Other specified postprocedural states: Secondary | ICD-10-CM | POA: Insufficient documentation

## 2018-10-11 DIAGNOSIS — Z9911 Dependence on respirator [ventilator] status: Secondary | ICD-10-CM | POA: Diagnosis not present

## 2018-10-11 DIAGNOSIS — J45909 Unspecified asthma, uncomplicated: Secondary | ICD-10-CM | POA: Diagnosis not present

## 2018-10-11 DIAGNOSIS — Z794 Long term (current) use of insulin: Secondary | ICD-10-CM | POA: Diagnosis not present

## 2018-10-11 DIAGNOSIS — N183 Chronic kidney disease, stage 3 unspecified: Secondary | ICD-10-CM

## 2018-10-11 DIAGNOSIS — G4733 Obstructive sleep apnea (adult) (pediatric): Secondary | ICD-10-CM | POA: Insufficient documentation

## 2018-10-11 DIAGNOSIS — I5022 Chronic systolic (congestive) heart failure: Secondary | ICD-10-CM | POA: Diagnosis present

## 2018-10-11 LAB — BASIC METABOLIC PANEL
ANION GAP: 10 (ref 5–15)
BUN: 40 mg/dL — AB (ref 6–20)
CO2: 24 mmol/L (ref 22–32)
Calcium: 8.9 mg/dL (ref 8.9–10.3)
Chloride: 99 mmol/L (ref 98–111)
Creatinine, Ser: 1.54 mg/dL — ABNORMAL HIGH (ref 0.61–1.24)
GFR calc Af Amer: 57 mL/min — ABNORMAL LOW (ref >60)
GFR, EST NON AFRICAN AMERICAN: 49 mL/min — AB (ref >60)
Glucose, Bld: 376 mg/dL — ABNORMAL HIGH (ref 70–99)
POTASSIUM: 4 mmol/L (ref 3.5–5.1)
SODIUM: 133 mmol/L — AB (ref 135–145)

## 2018-10-11 LAB — DIGOXIN LEVEL: DIGOXIN LVL: 0.2 ng/mL — AB (ref 0.8–2.0)

## 2018-10-11 LAB — MAGNESIUM: Magnesium: 2.3 mg/dL (ref 1.7–2.4)

## 2018-10-11 NOTE — Patient Instructions (Signed)
Labs today We will only contact you if something comes back abnormal or we need to make some changes. Otherwise no news is good news!  Your physician recommends that you schedule a follow-up appointment in: 4 months with Dr Gala Romney and echo -PLEASE CALL IN MARCH TO SCHEDULE THESE APPOINTMENTS-  Your physician has requested that you have an echocardiogram. Echocardiography is a painless test that uses sound waves to create images of your heart. It provides your doctor with information about the size and shape of your heart and how well your heart's chambers and valves are working. This procedure takes approximately one hour. There are no restrictions for this procedure.  Do the following things EVERYDAY: 1) Weigh yourself in the morning before breakfast. Write it down and keep it in a log. 2) Take your medicines as prescribed 3) Eat low salt foods-Limit salt (sodium) to 2000 mg per day.  4) Stay as active as you can everyday 5) Limit all fluids for the day to less than 2 liters

## 2018-10-14 ENCOUNTER — Ambulatory Visit (INDEPENDENT_AMBULATORY_CARE_PROVIDER_SITE_OTHER): Payer: Medicare Other

## 2018-10-14 DIAGNOSIS — I5022 Chronic systolic (congestive) heart failure: Secondary | ICD-10-CM | POA: Diagnosis not present

## 2018-10-14 DIAGNOSIS — I428 Other cardiomyopathies: Secondary | ICD-10-CM

## 2018-10-15 NOTE — Progress Notes (Signed)
Remote ICD transmission.   

## 2018-10-16 LAB — CUP PACEART REMOTE DEVICE CHECK
Battery Remaining Longevity: 67 mo
Battery Voltage: 2.99 V
Brady Statistic AS VS Percent: 1.55 %
Brady Statistic RV Percent Paced: 2.25 %
HIGH POWER IMPEDANCE MEASURED VALUE: 83 Ohm
Implantable Lead Implant Date: 20090901
Implantable Lead Implant Date: 20170210
Implantable Lead Location: 753859
Implantable Lead Location: 753860
Implantable Lead Model: 4598
Implantable Lead Model: 5076
Lead Channel Impedance Value: 1026 Ohm
Lead Channel Impedance Value: 342 Ohm
Lead Channel Impedance Value: 399 Ohm
Lead Channel Impedance Value: 608 Ohm
Lead Channel Impedance Value: 608 Ohm
Lead Channel Impedance Value: 760 Ohm
Lead Channel Impedance Value: 874 Ohm
Lead Channel Pacing Threshold Amplitude: 1 V
Lead Channel Sensing Intrinsic Amplitude: 10.125 mV
Lead Channel Sensing Intrinsic Amplitude: 10.125 mV
Lead Channel Setting Pacing Amplitude: 1.75 V
Lead Channel Setting Pacing Amplitude: 2 V
Lead Channel Setting Pacing Amplitude: 2 V
Lead Channel Setting Pacing Pulse Width: 0.4 ms
Lead Channel Setting Sensing Sensitivity: 0.3 mV
MDC IDC LEAD IMPLANT DT: 20170210
MDC IDC LEAD LOCATION: 753858
MDC IDC MSMT LEADCHNL LV IMPEDANCE VALUE: 456 Ohm
MDC IDC MSMT LEADCHNL LV IMPEDANCE VALUE: 456 Ohm
MDC IDC MSMT LEADCHNL LV IMPEDANCE VALUE: 532 Ohm
MDC IDC MSMT LEADCHNL LV IMPEDANCE VALUE: 988 Ohm
MDC IDC MSMT LEADCHNL LV IMPEDANCE VALUE: 988 Ohm
MDC IDC MSMT LEADCHNL LV PACING THRESHOLD PULSEWIDTH: 0.4 ms
MDC IDC MSMT LEADCHNL RA IMPEDANCE VALUE: 475 Ohm
MDC IDC MSMT LEADCHNL RA PACING THRESHOLD AMPLITUDE: 0.75 V
MDC IDC MSMT LEADCHNL RA PACING THRESHOLD PULSEWIDTH: 0.4 ms
MDC IDC MSMT LEADCHNL RA SENSING INTR AMPL: 4 mV
MDC IDC MSMT LEADCHNL RA SENSING INTR AMPL: 4 mV
MDC IDC MSMT LEADCHNL RV PACING THRESHOLD AMPLITUDE: 0.875 V
MDC IDC MSMT LEADCHNL RV PACING THRESHOLD PULSEWIDTH: 0.4 ms
MDC IDC PG IMPLANT DT: 20170210
MDC IDC SESS DTM: 20200109083624
MDC IDC SET LEADCHNL LV PACING PULSEWIDTH: 0.4 ms
MDC IDC STAT BRADY AP VP PERCENT: 0.59 %
MDC IDC STAT BRADY AP VS PERCENT: 0.03 %
MDC IDC STAT BRADY AS VP PERCENT: 97.83 %
MDC IDC STAT BRADY RA PERCENT PACED: 0.62 %

## 2018-11-08 ENCOUNTER — Ambulatory Visit (INDEPENDENT_AMBULATORY_CARE_PROVIDER_SITE_OTHER): Payer: Medicare Other

## 2018-11-08 DIAGNOSIS — I5022 Chronic systolic (congestive) heart failure: Secondary | ICD-10-CM

## 2018-11-08 DIAGNOSIS — Z95810 Presence of automatic (implantable) cardiac defibrillator: Secondary | ICD-10-CM | POA: Diagnosis not present

## 2018-11-09 NOTE — Progress Notes (Signed)
EPIC Encounter for ICM Monitoring  Patient Name: Gary Hutchinson is a 58 y.o. male Date: 11/09/2018 Primary Care Physican: Shon Baton, MD Primary Cardiologist:[name] Electrophysiologist: Allred Bi-V Pacing: 97.7% Last Weight:296lbs Today's Weight:292 lbs                                                    Hutchinson failure questions reviewed.  He feels fine.  He said he is drinking too much fluid because of dry mouth at [place].  Advised measure and include night time beverage to equal total of 64 oz daily.    Thoracic impedance normal with numerous days in the past month suggesting fluid accumulation.  Prescribed dosage: Torsemide 20 mg 2 tablets (40 mg total)every morning and 1 tablet every evening.Metolazone 2.5 mg 1 tablet as needed.  Labs: 08/18/2018 Creatinine 1.77, BUN 52, Potassium 4.3, Sodium 137, eGFR 42-48 05/20/2018 Creatinine 1.98, BUN 77, Potassium 3.7, Sodium 134, EGFR 36-41 01/20/2018 Creatinine 1.76, BUN 56, Potassium 4.0, Sodium 136, EGFR 41-48  Recommendations:  No changes.  Advised  Encouraged to call for fluid symptoms.  Follow-up plan: ICM clinic phone appointment on 12/13/2018.  Office appointment scheduled 01/05/2019 with Dr Rayann Heman.  Copy of ICM check sent to Dr. Rayann Heman.  3 month ICM trend: 11/08/2018    1 Year ICM trend:       [name], RN 11/09/2018 1:33 PM

## 2018-11-29 ENCOUNTER — Other Ambulatory Visit (HOSPITAL_COMMUNITY): Payer: Self-pay | Admitting: *Deleted

## 2018-11-29 MED ORDER — RIVAROXABAN 20 MG PO TABS: ORAL_TABLET | ORAL | 3 refills | Status: DC

## 2018-12-09 ENCOUNTER — Other Ambulatory Visit: Payer: Self-pay | Admitting: Cardiology

## 2018-12-13 ENCOUNTER — Ambulatory Visit (INDEPENDENT_AMBULATORY_CARE_PROVIDER_SITE_OTHER): Payer: Medicare Other

## 2018-12-13 DIAGNOSIS — Z95810 Presence of automatic (implantable) cardiac defibrillator: Secondary | ICD-10-CM

## 2018-12-13 DIAGNOSIS — I5022 Chronic systolic (congestive) heart failure: Secondary | ICD-10-CM | POA: Diagnosis not present

## 2018-12-15 NOTE — Progress Notes (Signed)
EPIC Encounter for ICM Monitoring  Patient Name: Gary Hutchinson is a 58 y.o. male Date: 12/15/2018 Primary Care Physican: Shon Baton, MD Primary Cardiologist:[name] Electrophysiologist: Allred Bi-V Pacing: 97.7% Last Weight:292lbs Today's Weight:290 lbs  Heart failure questions reviewed and patient is asymptomatic for fluid symptoms.  He had a cold last week and was not taking all his meds during that time but is back taking everything as prescribed.     Thoracic impedance normal with numerous days in the past month suggesting fluid accumulation.  Prescribed dosage:Torsemide 20 mg 2 tablets (40 mg total)every morning and 1 tablet every evening.Metolazone 2.5 mg 1 tablet as needed.  Labs: 08/18/2018 Creatinine 1.77, BUN 52, Potassium 4.3, Sodium 137, eGFR 42-48 05/20/2018 Creatinine 1.98, BUN 77, Potassium 3.7, Sodium 134, EGFR 36-41 01/20/2018 Creatinine 1.76, BUN 56, Potassium 4.0, Sodium 136, EGFR 41-48  Recommendations: No changes.  Encouraged to call for fluid symptoms.  Follow-up plan: ICM clinic phone appointment on5/01/2019. Office appointment scheduled 4/1/2020with Dr Rayann Heman.  Copy of ICM check sent to Dr.Allred.  3 month ICM trend: 12/13/2018    1 Year ICM trend:       [name], RN 12/15/2018 8:10 AM

## 2019-01-03 ENCOUNTER — Other Ambulatory Visit (HOSPITAL_COMMUNITY): Payer: Self-pay

## 2019-01-03 ENCOUNTER — Telehealth: Payer: Self-pay

## 2019-01-03 MED ORDER — TORSEMIDE 20 MG PO TABS: ORAL_TABLET | ORAL | 6 refills | Status: DC

## 2019-01-03 NOTE — Telephone Encounter (Signed)
Spoke with pt regarding virtual visit on 01/05/19. I explain steps to setup virtual visit. Pt questions and concerns were address.

## 2019-01-05 ENCOUNTER — Telehealth (INDEPENDENT_AMBULATORY_CARE_PROVIDER_SITE_OTHER): Payer: Medicare Other | Admitting: Internal Medicine

## 2019-01-05 DIAGNOSIS — I5022 Chronic systolic (congestive) heart failure: Secondary | ICD-10-CM

## 2019-01-05 DIAGNOSIS — I480 Paroxysmal atrial fibrillation: Secondary | ICD-10-CM

## 2019-01-05 DIAGNOSIS — I472 Ventricular tachycardia, unspecified: Secondary | ICD-10-CM

## 2019-01-05 NOTE — Progress Notes (Signed)
Electrophysiology TeleHealth Note   Due to national recommendations of social distancing due to COVID 19, an audio/video telehealth visit is felt to be most appropriate for this patient at this time.  See MyChart message from today for the patient's consent to telehealth for [facility].   Date:  01/05/2019   ID:  Gary Hutchinson, DOB May 18, 1961, MRN [id number]  Location: patient's home  Provider location: [address], [place]  Evaluation Performed: Follow-up visit  PCP:  Creola Corn, MD  Cardiologist:  Dr Gala Romney Electrophysiologist:  Dr [name]  Chief Complaint:  CHF  History of Present Illness:    Gary Hutchinson is a 58 y.o. male who presents via audio/video conferencing for a telehealth visit today.  Since last being seen in our clinic, the patient reports doing very well.  Today, he denies symptoms of palpitations, chest pain, shortness of breath,  lower extremity edema, dizziness, presyncope, or syncope.  The patient is otherwise without complaint today.  The patient denies symptoms of fevers, chills, cough, or new SOB worrisome for COVID 19.  Past Medical History:  Diagnosis Date  . AICD (automatic cardioverter/defibrillator) present    Medtronic  . Alcohol abuse    now quit  . Anemia    iron defi  . Arthritis    "fingers, knees, some days all my joints ache" (11/16/2015)  . Asthma   . Cholecystitis   . Chronic systolic congestive heart failure (HCC)    a. RHC (02/2011) RA 31/27, RV 71/27, PA67/45, PCWP 46, PA 49%, Fick CO: 3.4 b. ECHO (03/2014) EF 30-35%, grade II DD, mild MR, RV poorly visualized appears mildly decreased c. RHC (05/2014) RA 13, RV 54/4/11, PA 60/22 (37), PCWP 17, Fick CO/CI: 4.4 / 1.9, Thermo CO/CI: 3.8 /1.6, PVR 5.2 WU, PA 57% and 59%  . Diverticulosis of colon   . Gout   . Hemorrhoids, internal   . Hyperlipidemia   . Hypertension   . Morbid obesity (HCC)   . Nonischemic cardiomyopathy (HCC)    a. LHC (02/2011) normal  coronaries  . OSA on CPAP   . Paroxysmal atrial fibrillation (HCC)   . Pneumonia 2000s X 1  . Pulmonary hypertension (HCC)   . Renal insufficiency   . Type II diabetes mellitus (HCC)   . Ventricular tachycardia [facility])    s/p MDT ICD implant    Past Surgical History:  Procedure Laterality Date  . CARDIAC CATHETERIZATION  03/08/2004   EF 25-30%  . CARDIOVERSION N/A 10/17/2015   Procedure: CARDIOVERSION;  Surgeon: [name], MD;  Location: [facility] ENDOSCOPY;  Service: Cardiovascular;  Laterality: N/A;  . EP IMPLANTABLE DEVICE N/A 11/16/2015   Procedure: BiVI Upgrade;  Surgeon: Hillis Range, MD;  Medtronic [address] [place]  . INSERTION OF ICD  06/2008   ICD- Medtronic   . RIGHT HEART CATHETERIZATION N/A 05/23/2014   Procedure: RIGHT HEART CATH;  Surgeon: [name], MD;  Location: [facility] CATH LAB;  Service: Cardiovascular;  Laterality: N/A;  . TRANSTHORACIC ECHOCARDIOGRAM  05/27/2008   EF 30-35%  . US ECHOCARDIOGRAPHY  08/22/2008   EF 30-35%    Current Outpatient Medications  Medication Sig Dispense Refill  . acetaminophen (TYLENOL) 500 MG tablet Take 500 mg by mouth every 6 (six) hours as needed for moderate pain or headache.     . albuterol (PROAIR HFA) 108 (90 BASE) MCG/ACT inhaler Inhale 2 puffs into the lungs every 6 (six) hours as needed for wheezing or shortness  of breath. Reported on 02/06/2016    . allopurinol (ZYLOPRIM) 300 MG tablet Take 300 mg by mouth daily.    Marland Kitchen atorvastatin (LIPITOR) 40 MG tablet TAKE 1 TABLET (40 MG TOTAL) BY MOUTH DAILY. 90 tablet 2  . colchicine 0.6 MG tablet Take 0.6 mg by mouth daily as needed (for gout flares).     Marland Kitchen digoxin (LANOXIN) 0.25 MG tablet TAKE 1/2 TABLET EVERY OTHER DAY 45 tablet 3  . ENTRESTO 49-51 MG TAKE 1 TABLET BY MOUTH 2 (TWO) TIMES DAILY. 180 tablet 3  . Fluticasone-Salmeterol (ADVAIR DISKUS) 250-50 MCG/DOSE AEPB Inhale 1 puff into the lungs every 12 (twelve) hours as needed (for flares/shortness of breath). Reported on 02/06/2016    .  hydrALAZINE (APRESOLINE) 25 MG tablet TAKE 1.5 TABLETS (37.5 MG TOTAL) BY MOUTH 3 (THREE) TIMES DAILY. 405 tablet 3  . insulin detemir (LEVEMIR) 100 UNIT/ML injection Inject 36 Units into the skin at bedtime.     . isosorbide mononitrate (IMDUR) 30 MG 24 hr tablet TAKE 1 TABLET (30 MG TOTAL) BY MOUTH 2 TIMES DAILY. 180 tablet 3  . linagliptin (TRADJENTA) 5 MG TABS tablet Take 1 tablet (5 mg total) by mouth daily. 30 tablet 3  . methocarbamol (ROBAXIN) 500 MG tablet Take 1 tablet (500 mg total) by mouth 2 (two) times daily. 20 tablet 0  . metolazone (ZAROXOLYN) 2.5 MG tablet Take 2.5 mg by mouth daily as needed (for edema). Reported on 02/06/2016    . montelukast (SINGULAIR) 10 MG tablet Take 10 mg by mouth daily as needed (for shortness of breath, wheezing, or flares).     . rivaroxaban (XARELTO) 20 MG TABS tablet TAKE 1 TABLET (20 MG TOTAL) BY MOUTH DAILY WITH SUPPER. 90 tablet 3  . sotalol (BETAPACE) 80 MG tablet Take 1 tablet (80 mg total) by mouth 2 (two) times daily. 180 tablet 3  . spironolactone (ALDACTONE) 25 MG tablet Take 12.5 mg by mouth daily.     Marland Kitchen torsemide (DEMADEX) 20 MG tablet Take 2 tablets (40 mg total) by mouth every morning AND 1 tablet (20 mg total) every evening. 90 tablet 6   No current facility-administered medications for this visit.     Allergies:   Patient has no known allergies.   Social History:  The patient  reports that he has never smoked. He has never used smokeless tobacco. He reports current alcohol use. He reports that he does not use drugs.   Family History:  The patient's  family history includes CAD in his unknown relative; Cancer in his father; Diabetes in his unknown relative; Hypertension in his mother.   ROS:  Please see the history of present illness.   All other systems are personally reviewed and negative.    Exam:    Vital Signs:  BP 110/66   Pulse 65   Temp 98.6 F (37 C)   Wt 291 lb (132 kg)   SpO2 98%   BMI 48.42 kg/m   Well  appearing, alert and conversant, regular work of breathing,  good skin color Eyes- anicteric, neuro- grossly intact, skin- no apparent rash or lesions or cyanosis, mouth- oral mucosa is pink   Labs/Other Tests and Data Reviewed:    Recent Labs: 01/20/2018: Hemoglobin 12.2; Platelets 177 10/11/2018: BUN 40; Creatinine, Ser 1.54; Magnesium 2.3; Potassium 4.0; Sodium 133   Wt Readings from Last 3 Encounters:  01/05/19 291 lb (132 kg)  10/11/18 298 lb (135.2 kg)  08/18/18 298 lb (135.2 kg)  Other studies personally reviewed: Additional studies/ records that were reviewed today include: Amber Seilers recent note, CHF clinic notes  Review of the above records today demonstrates: as above Prior radiographs: none pertinent  Last device remote is reviewed from PaceART PDF dated 10/14/2018 which reveals normal device function, no arrhythmias , last afib 01/2018   ASSESSMENT & PLAN:    1.  afib  Well controlled with sotalol On xarelto for chads2vasc score of 3.  2. Chronic systolic dysfunction CHF symptoms are well controlled Working on lifestyle modification and following in ICM clinic with Randon Goldsmith  3. VT Well controlled Normal ICD function by remote review today  4. Morbid obesity He is working on lifestyle modification  5. OSA Compliant with BiPAP  6. COVID 19 screen The patient denies symptoms of COVID 19 at this time.  The importance of social distancing was discussed today.  Follow-up:  6 months with EP NP Next remote: due  01/2019  Current medicines are reviewed at length with the patient today.   The patient does not have concerns regarding his medicines.  The following changes were made today:  none  Labs/ tests ordered today include:  No orders of the defined types were placed in this encounter.   Patient Risk:  after full review of this patients clinical status, I feel that they are at highrisk at this time.  Today, I have spent 25 minutes with the patient  with telehealth technology discussing CHf and AF management .    Randolm Idol, MD  01/05/2019 10:38 AM     [facility] [address] [place] [address] [phone] (office) [phone] (fax)

## 2019-01-13 ENCOUNTER — Other Ambulatory Visit: Payer: Self-pay

## 2019-01-13 ENCOUNTER — Ambulatory Visit (INDEPENDENT_AMBULATORY_CARE_PROVIDER_SITE_OTHER): Payer: Medicare Other | Admitting: *Deleted

## 2019-01-13 DIAGNOSIS — I472 Ventricular tachycardia, unspecified: Secondary | ICD-10-CM

## 2019-01-13 LAB — CUP PACEART REMOTE DEVICE CHECK
Battery Remaining Longevity: 64 mo
Battery Voltage: 2.98 V
Brady Statistic AP VP Percent: 2.1 %
Brady Statistic AP VS Percent: 0.04 %
Brady Statistic AS VP Percent: 96.3 %
Brady Statistic AS VS Percent: 1.55 %
Brady Statistic RA Percent Paced: 2.13 %
Brady Statistic RV Percent Paced: 5.56 %
Date Time Interrogation Session: 20200409062703
HighPow Impedance: 81 Ohm
Implantable Lead Implant Date: 20090901
Implantable Lead Implant Date: 20170210
Implantable Lead Implant Date: 20170210
Implantable Lead Location: 753858
Implantable Lead Location: 753859
Implantable Lead Location: 753860
Implantable Lead Model: 4598
Implantable Lead Model: 5076
Implantable Lead Model: 6935
Implantable Pulse Generator Implant Date: 20170210
Lead Channel Impedance Value: 304 Ohm
Lead Channel Impedance Value: 361 Ohm
Lead Channel Impedance Value: 399 Ohm
Lead Channel Impedance Value: 399 Ohm
Lead Channel Impedance Value: 418 Ohm
Lead Channel Impedance Value: 418 Ohm
Lead Channel Impedance Value: 475 Ohm
Lead Channel Impedance Value: 532 Ohm
Lead Channel Impedance Value: 646 Ohm
Lead Channel Impedance Value: 665 Ohm
Lead Channel Impedance Value: 817 Ohm
Lead Channel Impedance Value: 817 Ohm
Lead Channel Impedance Value: 836 Ohm
Lead Channel Pacing Threshold Amplitude: 0.75 V
Lead Channel Pacing Threshold Amplitude: 0.875 V
Lead Channel Pacing Threshold Amplitude: 1 V
Lead Channel Pacing Threshold Pulse Width: 0.4 ms
Lead Channel Pacing Threshold Pulse Width: 0.4 ms
Lead Channel Pacing Threshold Pulse Width: 0.4 ms
Lead Channel Sensing Intrinsic Amplitude: 14.5 mV
Lead Channel Sensing Intrinsic Amplitude: 3.75 mV
Lead Channel Setting Pacing Amplitude: 1.5 V
Lead Channel Setting Pacing Amplitude: 2 V
Lead Channel Setting Pacing Amplitude: 2 V
Lead Channel Setting Pacing Pulse Width: 0.4 ms
Lead Channel Setting Pacing Pulse Width: 0.4 ms
Lead Channel Setting Sensing Sensitivity: 0.3 mV

## 2019-01-24 ENCOUNTER — Encounter: Payer: Self-pay | Admitting: Cardiology

## 2019-01-24 NOTE — Progress Notes (Signed)
Remote ICD transmission.   

## 2019-01-29 ENCOUNTER — Other Ambulatory Visit (HOSPITAL_COMMUNITY): Payer: Self-pay | Admitting: Internal Medicine

## 2019-02-02 ENCOUNTER — Other Ambulatory Visit (HOSPITAL_COMMUNITY): Payer: Self-pay | Admitting: Internal Medicine

## 2019-02-07 ENCOUNTER — Other Ambulatory Visit: Payer: Self-pay

## 2019-02-07 ENCOUNTER — Ambulatory Visit (INDEPENDENT_AMBULATORY_CARE_PROVIDER_SITE_OTHER): Payer: Medicare Other

## 2019-02-07 DIAGNOSIS — I5022 Chronic systolic (congestive) heart failure: Secondary | ICD-10-CM

## 2019-02-07 DIAGNOSIS — Z95810 Presence of automatic (implantable) cardiac defibrillator: Secondary | ICD-10-CM

## 2019-02-07 NOTE — Progress Notes (Signed)
EPIC Encounter for ICM Monitoring  Patient Name: Gary Hutchinson is a 58 y.o. male Date: 02/07/2019 Primary Care Physican: Creola Corn, MD Primary Cardiologist:[name] Electrophysiologist: Allred Bi-V Pacing: 96.6% Last Weight:290lbs 02/07/2019 Weight:293 lbs  Heart failure questions reviewed and patient is asymptomatic for fluid symptoms.      Optivol thoracic impedance normal.  Prescribed dosage:Torsemide 20 mg 2 tablets (40 mg total)every morning and 1 tablet every evening.Metolazone 2.5 mg 1 tablet as needed.  Labs: 10/11/2018 Creatinine 1.54, BUN 40, Potassium 4.0, Sodium 133, GFR 49-57 08/18/2018 Creatinine 1.77, BUN 52, Potassium 4.3, Sodium 137, GFR 42-48 05/20/2018 Creatinine 1.98, BUN 77, Potassium 3.7, Sodium 134, GFR 36-41 01/20/2018 Creatinine 1.76, BUN 56, Potassium 4.0, Sodium 136, GFR 41-48  Recommendations:No changes. Encouraged to call for fluid symptoms.  Follow-up plan: ICM clinic phone appointment on6/05/2019.   Copy of ICM check sent to Dr.Allred.   3 month ICM trend: 02/07/2019    1 Year ICM trend:       Karie Soda, RN 02/07/2019 9:30 AM

## 2019-02-13 ENCOUNTER — Other Ambulatory Visit (HOSPITAL_COMMUNITY): Payer: Self-pay | Admitting: Internal Medicine

## 2019-02-13 ENCOUNTER — Other Ambulatory Visit: Payer: Self-pay | Admitting: Internal Medicine

## 2019-03-10 ENCOUNTER — Other Ambulatory Visit: Payer: Self-pay

## 2019-03-10 ENCOUNTER — Ambulatory Visit (INDEPENDENT_AMBULATORY_CARE_PROVIDER_SITE_OTHER): Payer: Medicare Other | Admitting: Internal Medicine

## 2019-03-10 ENCOUNTER — Encounter: Payer: Self-pay | Admitting: Internal Medicine

## 2019-03-10 VITALS — BP 128/60 | HR 81 | Temp 97.4°F | Ht 66.0 in | Wt 295.0 lb

## 2019-03-10 DIAGNOSIS — G4733 Obstructive sleep apnea (adult) (pediatric): Secondary | ICD-10-CM

## 2019-03-10 NOTE — Patient Instructions (Signed)
Order- DME [employer]- please replace old BIPAP machine, 17/ 13, PS 2, continue mask of choice, humidifier supplies, AirView/ card  Please call if we can help

## 2019-03-10 NOTE — Progress Notes (Signed)
03/10/2019- 58 yoM never smoker for sleep evaluation. Coming to re-establish after LOV 2017.Had seen Dr. Shelle Iron for OSA in the past; OSA on BIPAP, DME: [employer], no issues w/ machine, mask, or pressure settings at this time; has questions about cleaning it;  Medical problem list includes AFib, VTach/ AICD HBP, Chronic Systolic CHF, Amiodarone induce hypothyroid, DM2, Morbid obesity, Gout, Diverticulosis NPSG 04/06/01 AHI 117/ hr, desaturation to 60%, body weight 324 lbs BIPAP IPAP 17/ EPAP 13/ [employer] Download 80% compliance, AHI 2.1/ hr Body weight today 295 lbs Epworth score 3 Sleeps ok w BIPAP. Occ DIMS- not concerned. Asks about SoClean devices. No ENT surgery. Asthma only with colds.   Prior to Admission medications   Medication Sig Start Date End Date Taking? Authorizing Provider  acetaminophen (TYLENOL) 500 MG tablet Take 500 mg by mouth every 6 (six) hours as needed for moderate pain or headache.    Yes [provider]  albuterol (PROAIR HFA) 108 (90 BASE) MCG/ACT inhaler Inhale 2 puffs into the lungs every 6 (six) hours as needed for wheezing or shortness of breath. Reported on 02/06/2016   Yes [provider]  allopurinol (ZYLOPRIM) 300 MG tablet Take 300 mg by mouth daily.   Yes [provider]  atorvastatin (LIPITOR) 40 MG tablet TAKE 1 TABLET (40 MG TOTAL) BY MOUTH DAILY. 03/02/18  Yes [name], Bevelyn Buckles, MD  colchicine 0.6 MG tablet Take 0.6 mg by mouth daily as needed (for gout flares).    Yes [provider]  digoxin (LANOXIN) 0.25 MG tablet TAKE 1/2 TABLET EVERY OTHER DAY 01/31/19  Yes [name], Bevelyn Buckles, MD  ENTRESTO 49-51 MG TAKE 1 TABLET BY MOUTH 2 (TWO) TIMES DAILY. 02/14/19  Yes [name], Bevelyn Buckles, MD  Fluticasone-Salmeterol (ADVAIR DISKUS) 250-50 MCG/DOSE AEPB Inhale 1 puff into the lungs every 12 (twelve) hours as needed (for flares/shortness of breath). Reported on 02/06/2016   Yes [provider]  insulin detemir (LEVEMIR) 100 UNIT/ML  injection Inject 36 Units into the skin at bedtime.  03/13/14  Yes Ulla Potash B, NP  isosorbide mononitrate (IMDUR) 30 MG 24 hr tablet TAKE 1 TABLET (30 MG TOTAL) BY MOUTH 2 TIMES DAILY. 09/08/18  Yes [name], Bevelyn Buckles, MD  linagliptin (TRADJENTA) 5 MG TABS tablet Take 1 tablet (5 mg total) by mouth daily. 03/13/14  Yes Aundria Rud, NP  methocarbamol (ROBAXIN) 500 MG tablet Take 1 tablet (500 mg total) by mouth 2 (two) times daily. 07/01/18  Yes Dartha Lodge, [name]  metolazone (ZAROXOLYN) 2.5 MG tablet Take 2.5 mg by mouth daily as needed (for edema). Reported on 02/06/2016   Yes [provider]  montelukast (SINGULAIR) 10 MG tablet Take 10 mg by mouth daily as needed (for shortness of breath, wheezing, or flares).    Yes [provider]  rivaroxaban (XARELTO) 20 MG TABS tablet TAKE 1 TABLET (20 MG TOTAL) BY MOUTH DAILY WITH SUPPER. 11/29/18  Yes [name], Bevelyn Buckles, MD  sotalol (BETAPACE) 80 MG tablet TAKE 1 TABLET (80 MG TOTAL) BY MOUTH 2 (TWO) TIMES DAILY. 02/14/19  Yes Allred, Fayrene Fearing, MD  spironolactone (ALDACTONE) 25 MG tablet Take 12.5 mg by mouth daily.  10/28/15  Yes [provider]  torsemide (DEMADEX) 20 MG tablet Take 2 tablets (40 mg total) by mouth every morning AND 1 tablet (20 mg total) at bedtime. 02/03/19  Yes [name], Bevelyn Buckles, MD  hydrALAZINE (APRESOLINE) 25 MG tablet TAKE 1.5 TABLETS (37.5 MG TOTAL) BY MOUTH 3 (THREE) TIMES DAILY. 06/01/18  01/05/19  Graciella Freerillery, Michael Andrew, [name]   Past Medical History:  Diagnosis Date  . AICD (automatic cardioverter/defibrillator) present    Medtronic  . Alcohol abuse    now quit  . Anemia    iron defi  . Arthritis    "fingers, knees, some days all my joints ache" (11/16/2015)  . Asthma   . Cholecystitis   . Chronic systolic congestive heart failure (HCC)    a. RHC (02/2011) RA 31/27, RV 71/27, PA67/45, PCWP 46, PA 49%, Fick CO: 3.4 b. ECHO (03/2014) EF 30-35%, grade II DD, mild MR, RV poorly visualized appears mildly  decreased c. RHC (05/2014) RA 13, RV 54/4/11, PA 60/22 (37), PCWP 17, Fick CO/CI: 4.4 / 1.9, Thermo CO/CI: 3.8 /1.6, PVR 5.2 WU, PA 57% and 59%  . Diverticulosis of colon   . Gout   . Hemorrhoids, internal   . Hyperlipidemia   . Hypertension   . Morbid obesity (HCC)   . Nonischemic cardiomyopathy (HCC)    a. LHC (02/2011) normal coronaries  . OSA on CPAP   . Paroxysmal atrial fibrillation (HCC)   . Pneumonia 2000s X 1  . Pulmonary hypertension (HCC)   . Renal insufficiency   . Type II diabetes mellitus (HCC)   . Ventricular tachycardia [facility](HCC)    s/p MDT ICD implant   Past Surgical History:  Procedure Laterality Date  . CARDIAC CATHETERIZATION  03/08/2004   EF 25-30%  . CARDIOVERSION N/A 10/17/2015   Procedure: CARDIOVERSION;  Surgeon: Vesta MixerPhilip J Nahser, MD;  [facility];  Service: Cardiovascular;  Laterality: N/A;  . EP IMPLANTABLE DEVICE N/A 11/16/2015   Procedure: BiVI Upgrade;  Surgeon: Hillis RangeJames Allred, MD;  [employer] [address]  . INSERTION OF ICD  06/2008   ICD- Medtronic   . RIGHT HEART CATHETERIZATION N/A 05/23/2014   Procedure: RIGHT HEART CATH;  Surgeon: Dolores Pattyaniel R [name], MD;  [facility];  Service: Cardiovascular;  Laterality: N/A;  . TRANSTHORACIC ECHOCARDIOGRAM  05/27/2008   EF 30-35%  . US ECHOCARDIOGRAPHY  08/22/2008   EF 30-35%   Family History  Problem Relation Age of Onset  . Cancer Father   . Diabetes Other        grandparents  . Hypertension Mother   . CAD Other        No family history   Social History   Socioeconomic History  . Marital status: Single    Spouse name: Not on file  . Number of children: 4  . Years of education: Not on file  . Highest education level: Not on file  Occupational History    Employer: DISABLED  Social Needs  . Financial resource strain: Not on file  . Food insecurity:    Worry: Not on file    Inability: Not on file  . Transportation needs:    Medical: Not on file    Non-medical: Not on file  Tobacco  Use  . Smoking status: Never Smoker  . Smokeless tobacco: Never Used  Substance and Sexual Activity  . Alcohol use: Yes    Comment: former heavy ETOH, quit in "the late '90s"  . Drug use: No  . Sexual activity: Not Currently  Lifestyle  . Physical activity:    Days per week: Not on file    Minutes per session: Not on file  . Stress: Not on file  Relationships  . Social connections:    Talks on phone: Not on file    Gets together: Not on  file    Attends religious service: Not on file    Active member of club or organization: Not on file    Attends meetings of clubs or organizations: Not on file    Relationship status: Not on file  . Intimate partner violence:    Fear of current or ex partner: Not on file    Emotionally abused: Not on file    Physically abused: Not on file    Forced sexual activity: Not on file  Other Topics Concern  . Not on file  Social History Narrative   disabled   ROS-see HPI   + = positive Constitutional:    weight loss, night sweats, fevers, chills, fatigue, lassitude. HEENT:    headaches, difficulty swallowing, tooth/dental problems, sore throat,       sneezing, itching, ear ache, nasal congestion, post nasal drip, snoring CV:    chest pain, orthopnea, PND, swelling in lower extremities, anasarca,                                  dizziness, palpitations Resp:   shortness of breath with exertion or at rest.                productive cough,   non-productive cough, coughing up of blood.              change in color of mucus.  wheezing.   Skin:    rash or lesions. GI:  No-   heartburn, indigestion, abdominal pain, nausea, vomiting, diarrhea,                 change in bowel habits, loss of appetite GU: dysuria, change in color of urine, no urgency or frequency.   flank pain. MS:   joint pain, stiffness, decreased range of motion, back pain. Neuro-     nothing unusual Psych:  change in mood or affect.  depression or anxiety.   memory loss.  OBJ-  Physical Exam General- Alert, Oriented, Affect-appropriate, Distress- none acute, + morbidly obese Skin- rash-none, lesions- none, excoriation- none Lymphadenopathy- none Head- atraumatic            Eyes- Gross vision intact, PERRLA, conjunctivae and secretions clear            Ears- Hearing, canals-normal            Nose- Clear, no-Septal dev, mucus, polyps, erosion, perforation             Throat-+ upper plate, Mallampati III , mucosa clear , drainage- none, tonsils- atrophic Neck- flexible , trachea midline, no stridor , thyroid nl, carotid no bruit Chest - symmetrical excursion , unlabored           Heart/CV- RRR , no murmur , no gallop  , no rub, nl s1 s2                           - JVD- none , edema- none, stasis changes- none, varices- none           Lung- clear to P&A, wheeze- none, cough- none , dullness-none, rub- none           Chest wall-  Abd-  Br/ Gen/ Rectal- Not done, not indicated Extrem- cyanosis- none, clubbing, none, atrophy- none, strength- nl Neuro- grossly intact to observation

## 2019-03-13 NOTE — Assessment & Plan Note (Signed)
Discussed weight loss goals and support. Consider Bariatric referral

## 2019-03-13 NOTE — Assessment & Plan Note (Signed)
Benefits fron BIPA and download confirms good compliance and control. Discussed cleaning machines.  Plan- [employer] replace old BIPAP machine I17, E13, PS2

## 2019-03-14 ENCOUNTER — Ambulatory Visit (INDEPENDENT_AMBULATORY_CARE_PROVIDER_SITE_OTHER): Payer: Medicare Other

## 2019-03-14 DIAGNOSIS — Z95810 Presence of automatic (implantable) cardiac defibrillator: Secondary | ICD-10-CM

## 2019-03-14 DIAGNOSIS — I5022 Chronic systolic (congestive) heart failure: Secondary | ICD-10-CM | POA: Diagnosis not present

## 2019-03-16 NOTE — Progress Notes (Signed)
EPIC Encounter for ICM Monitoring  Patient Name: Gary Hutchinson is a 58 y.o. male Date: 03/16/2019 Primary Care Physican: Shon Baton, MD Primary Cardiologist:[name] Electrophysiologist: Allred Bi-V Pacing: 96.1% 02/07/2019 Weight:293lbs  Transmission reviewed and results sent via mychart.  Optivol thoracic impedance normal.  Prescribed dosage:Torsemide 20 mg 2 tablets (40 mg total)every morning and 1 tablet every evening.Metolazone 2.5 mg 1 tablet as needed.  Labs: 10/11/2018 Creatinine 1.54, BUN 40, Potassium 4.0, Sodium 133, GFR 49-57 08/18/2018 Creatinine 1.77, BUN 52, Potassium 4.3, Sodium 137, GFR 42-48 05/20/2018 Creatinine 1.98, BUN 77, Potassium 3.7, Sodium 134, GFR 36-41 01/20/2018 Creatinine 1.76, BUN 56, Potassium 4.0, Sodium 136, GFR 41-48  Recommendations:Advised to limit salt/fluid intake and to call for fluid symptoms.  Follow-up plan: ICM clinic phone appointment on7/07/2019.   Copy of ICM check sent to Dr.Allred.   3 month ICM trend: 03/14/2019    1 Year ICM trend:       [name], RN 03/16/2019 8:25 AM

## 2019-04-14 ENCOUNTER — Ambulatory Visit (INDEPENDENT_AMBULATORY_CARE_PROVIDER_SITE_OTHER): Payer: Medicare Other | Admitting: *Deleted

## 2019-04-14 DIAGNOSIS — I472 Ventricular tachycardia, unspecified: Secondary | ICD-10-CM

## 2019-04-14 DIAGNOSIS — I5022 Chronic systolic (congestive) heart failure: Secondary | ICD-10-CM | POA: Diagnosis not present

## 2019-04-14 LAB — CUP PACEART REMOTE DEVICE CHECK
Battery Remaining Longevity: 55 mo
Battery Voltage: 2.97 V
Brady Statistic AP VP Percent: 2.43 %
Brady Statistic AP VS Percent: 0.05 %
Brady Statistic AS VP Percent: 91.89 %
Brady Statistic AS VS Percent: 5.63 %
Brady Statistic RA Percent Paced: 2.41 %
Brady Statistic RV Percent Paced: 14.45 %
Date Time Interrogation Session: 20200709062604
HighPow Impedance: 66 Ohm
Implantable Lead Implant Date: 20090901
Implantable Lead Implant Date: 20170210
Implantable Lead Implant Date: 20170210
Implantable Lead Location: 753858
Implantable Lead Location: 753859
Implantable Lead Location: 753860
Implantable Lead Model: 4598
Implantable Lead Model: 5076
Implantable Lead Model: 6935
Implantable Pulse Generator Implant Date: 20170210
Lead Channel Impedance Value: 342 Ohm
Lead Channel Impedance Value: 399 Ohm
Lead Channel Impedance Value: 418 Ohm
Lead Channel Impedance Value: 418 Ohm
Lead Channel Impedance Value: 475 Ohm
Lead Channel Impedance Value: 513 Ohm
Lead Channel Impedance Value: 551 Ohm
Lead Channel Impedance Value: 589 Ohm
Lead Channel Impedance Value: 665 Ohm
Lead Channel Impedance Value: 722 Ohm
Lead Channel Impedance Value: 874 Ohm
Lead Channel Impedance Value: 874 Ohm
Lead Channel Impedance Value: 931 Ohm
Lead Channel Pacing Threshold Amplitude: 0.875 V
Lead Channel Pacing Threshold Amplitude: 1 V
Lead Channel Pacing Threshold Amplitude: 1 V
Lead Channel Pacing Threshold Pulse Width: 0.4 ms
Lead Channel Pacing Threshold Pulse Width: 0.4 ms
Lead Channel Pacing Threshold Pulse Width: 0.4 ms
Lead Channel Sensing Intrinsic Amplitude: 1.25 mV
Lead Channel Sensing Intrinsic Amplitude: 8.375 mV
Lead Channel Setting Pacing Amplitude: 2 V
Lead Channel Setting Pacing Amplitude: 2 V
Lead Channel Setting Pacing Amplitude: 2 V
Lead Channel Setting Pacing Pulse Width: 0.4 ms
Lead Channel Setting Pacing Pulse Width: 0.4 ms
Lead Channel Setting Sensing Sensitivity: 0.3 mV

## 2019-04-15 ENCOUNTER — Ambulatory Visit (INDEPENDENT_AMBULATORY_CARE_PROVIDER_SITE_OTHER): Payer: Medicare Other

## 2019-04-15 DIAGNOSIS — I5022 Chronic systolic (congestive) heart failure: Secondary | ICD-10-CM | POA: Diagnosis not present

## 2019-04-15 DIAGNOSIS — Z95810 Presence of automatic (implantable) cardiac defibrillator: Secondary | ICD-10-CM

## 2019-04-18 NOTE — Progress Notes (Signed)
EPIC Encounter for ICM Monitoring  Patient Name: Gary Hutchinson is a 58 y.o. male Date: 04/18/2019 Primary Care Physican: Shon Baton, MD Primary Cardiologist:[name] Electrophysiologist: Allred Bi-V Pacing: 97.5% 5/04/2020Weight:293lbs 04/18/2019 Weight:  Unknown  Transmission reviewed.  Heart failure questions reviewed.  He reports eating foods higher in salt and drinking a lot of fluids the last couple of weeks. He denies any fluid symptoms.  Stressed the importance of monitoring salt and fluid intake.  Advised to record daily weights and consistently weigh after getting out of bed with same type of clothes to determine if he is gaining fluid weight.   Optivol thoracic impedance decreased since 6/25 sggesting possible fluid accumulation and fluid index is > normal threshold starting 7/1.  Prescribed dosage:Torsemide 20 mg 2 tablets (40 mg total)every morning and 1 tablet every evening.Metolazone 2.5 mg 1 tablet as needed.  Labs: 01/06/2020Creatinine 1.54, BUN40, Potassium 4.0, Sodium 133, GFR 49-57 08/18/2018 Creatinine 1.77, BUN 52, Potassium 4.3, Sodium 137, GFR 42-48 05/20/2018 Creatinine 1.98, BUN 77, Potassium 3.7, Sodium 134, GFR 36-41 01/20/2018 Creatinine 1.76, BUN 56, Potassium 4.0, Sodium 136, GFR 41-48  Recommendations:He will take Metolazone as prescribed x 1 dose.   Advised to track fluid intake and limit to 64 oz daily and salt intake to 2000 mg daily.  Follow-up plan: ICM clinic phone appointment on7/17/2020 to recheck fluid levels. Last visit with Dr Haroldine Laws 10/11/2018 and encouraged to call to make a routine visit with Dr Haroldine Laws (recall was for May 2020).   Copy of ICM check sent to Dr.Allred and Dr Haroldine Laws.  3 month ICM trend: 04/15/2019    1 Year ICM trend:       [name], RN 04/18/2019 11:31 AM

## 2019-04-20 NOTE — Progress Notes (Signed)
Remote ICD transmission.   

## 2019-04-22 ENCOUNTER — Ambulatory Visit (INDEPENDENT_AMBULATORY_CARE_PROVIDER_SITE_OTHER): Payer: Medicare Other

## 2019-04-22 DIAGNOSIS — I5022 Chronic systolic (congestive) heart failure: Secondary | ICD-10-CM

## 2019-04-22 DIAGNOSIS — Z95810 Presence of automatic (implantable) cardiac defibrillator: Secondary | ICD-10-CM

## 2019-04-22 NOTE — Progress Notes (Signed)
EPIC Encounter for ICM Monitoring  Patient Name: Gary Hutchinson is a 58 y.o. male Date: 04/22/2019 Primary Care Physican: Shon Baton, MD Primary Cardiologist:[name] Electrophysiologist: Allred Bi-V Pacing: 96.2% 5/04/2020Weight:293lbs 04/22/2019 Weight:  289 lbs  Transmission reviewed.  Heart failure questions reviewed.  He said weight decreased after taking Metolazone.  He said he is back on track with low sodium diet.    Optivol thoracic impedance returned to normal after last remote transmission 04/15/2019.  Prescribed dosage:Torsemide 20 mg 2 tablets (40 mg total)every morning and 1 tablet every evening.Metolazone 2.5 mg 1 tablet as needed.  Labs: 01/06/2020Creatinine 1.54, BUN40, Potassium 4.0, Sodium 133, GFR 49-57 08/18/2018 Creatinine 1.77, BUN 52, Potassium 4.3, Sodium 137, GFR 42-48 05/20/2018 Creatinine 1.98, BUN 77, Potassium 3.7, Sodium 134, GFR 36-41 01/20/2018 Creatinine 1.76, BUN 56, Potassium 4.0, Sodium 136, GFR 41-48  Recommendations: Advised to track fluid intake and limit to 64 oz daily and salt intake to 2000 mg daily.  Follow-up plan: ICM clinic phone appointment on8/13/2020. HF clinic appt scheduled for 05/24/2019.   Copy of ICM check sent to Dr.Allred and Dr Haroldine Laws.   3 month ICM trend: 04/22/2019    1 Year ICM trend:       [name], RN 04/22/2019 12:58 PM

## 2019-05-19 ENCOUNTER — Ambulatory Visit (INDEPENDENT_AMBULATORY_CARE_PROVIDER_SITE_OTHER): Payer: Medicare Other

## 2019-05-19 DIAGNOSIS — I5022 Chronic systolic (congestive) heart failure: Secondary | ICD-10-CM

## 2019-05-19 DIAGNOSIS — Z95810 Presence of automatic (implantable) cardiac defibrillator: Secondary | ICD-10-CM | POA: Diagnosis not present

## 2019-05-20 NOTE — Progress Notes (Signed)
EPIC Encounter for ICM Monitoring  Patient Name: Gary Hutchinson is a 58 y.o. male Date: 05/20/2019 Primary Care Physican: Shon Baton, MD Primary Cardiologist:[name] Electrophysiologist: Allred Bi-V Pacing: 96.2% 04/22/2019 Weight: 289 lbs 05/20/2019 Weight: 284 lbs  Transmission reviewed.Heart failure questions reviewed and he is *.     Optivol thoracic impedance normal.  Prescribed dosage:Torsemide 20 mg 2 tablets (40 mg total)every morning and 1 tablet every evening.Metolazone 2.5 mg 1 tablet as needed.  Labs: 01/06/2020Creatinine 1.54, BUN40, Potassium 4.0, Sodium 133, GFR 49-57 08/18/2018 Creatinine 1.77, BUN 52, Potassium 4.3, Sodium 137, GFR 42-48 05/20/2018 Creatinine 1.98, BUN 77, Potassium 3.7, Sodium 134, GFR 36-41 01/20/2018 Creatinine 1.76, BUN 56, Potassium 4.0, Sodium 136, GFR 41-48  Recommendations:No changes and encouraged to call if experiencing any fluid symptoms.  Follow-up plan: ICM clinic phone appointment on10/09/2019. HF clinic appt scheduled for 05/24/2019.  Copy of ICM check sent to Dr.Allred.   3 month ICM trend: 05/20/2019    1 Year ICM trend:       [name], RN 05/20/2019 1:23 PM

## 2019-05-24 ENCOUNTER — Encounter (HOSPITAL_COMMUNITY): Payer: Self-pay

## 2019-05-24 ENCOUNTER — Ambulatory Visit (HOSPITAL_COMMUNITY)
Admission: RE | Admit: 2019-05-24 | Discharge: 2019-05-24 | Disposition: A | Discharge status: Home / Self Care | Payer: Medicare Other | Source: Ambulatory Visit | Attending: Internal Medicine | Admitting: Internal Medicine

## 2019-05-24 ENCOUNTER — Other Ambulatory Visit: Payer: Self-pay

## 2019-05-24 VITALS — BP 118/65 | HR 75 | Wt 282.6 lb

## 2019-05-24 DIAGNOSIS — Z794 Long term (current) use of insulin: Secondary | ICD-10-CM | POA: Diagnosis not present

## 2019-05-24 DIAGNOSIS — I130 Hypertensive heart and chronic kidney disease with heart failure and stage 1 through stage 4 chronic kidney disease, or unspecified chronic kidney disease: Secondary | ICD-10-CM | POA: Insufficient documentation

## 2019-05-24 DIAGNOSIS — Z79899 Other long term (current) drug therapy: Secondary | ICD-10-CM | POA: Diagnosis not present

## 2019-05-24 DIAGNOSIS — N183 Chronic kidney disease, stage 3 unspecified: Secondary | ICD-10-CM

## 2019-05-24 DIAGNOSIS — E785 Hyperlipidemia, unspecified: Secondary | ICD-10-CM | POA: Insufficient documentation

## 2019-05-24 DIAGNOSIS — G4733 Obstructive sleep apnea (adult) (pediatric): Secondary | ICD-10-CM

## 2019-05-24 DIAGNOSIS — M109 Gout, unspecified: Secondary | ICD-10-CM | POA: Diagnosis not present

## 2019-05-24 DIAGNOSIS — E1122 Type 2 diabetes mellitus with diabetic chronic kidney disease: Secondary | ICD-10-CM | POA: Insufficient documentation

## 2019-05-24 DIAGNOSIS — Z95810 Presence of automatic (implantable) cardiac defibrillator: Secondary | ICD-10-CM | POA: Insufficient documentation

## 2019-05-24 DIAGNOSIS — I480 Paroxysmal atrial fibrillation: Secondary | ICD-10-CM | POA: Diagnosis not present

## 2019-05-24 DIAGNOSIS — Z6841 Body Mass Index (BMI) 40.0 and over, adult: Secondary | ICD-10-CM | POA: Insufficient documentation

## 2019-05-24 DIAGNOSIS — Z7901 Long term (current) use of anticoagulants: Secondary | ICD-10-CM | POA: Diagnosis not present

## 2019-05-24 DIAGNOSIS — J45909 Unspecified asthma, uncomplicated: Secondary | ICD-10-CM | POA: Diagnosis not present

## 2019-05-24 DIAGNOSIS — I5022 Chronic systolic (congestive) heart failure: Secondary | ICD-10-CM | POA: Diagnosis not present

## 2019-05-24 DIAGNOSIS — I428 Other cardiomyopathies: Secondary | ICD-10-CM

## 2019-05-24 LAB — BASIC METABOLIC PANEL
Anion gap: 12 (ref 5–15)
BUN: 58 mg/dL — ABNORMAL HIGH (ref 6–20)
CO2: 23 mmol/L (ref 22–32)
Calcium: 9.1 mg/dL (ref 8.9–10.3)
Chloride: 100 mmol/L (ref 98–111)
Creatinine, Ser: 1.83 mg/dL — ABNORMAL HIGH (ref 0.61–1.24)
GFR calc Af Amer: 46 mL/min — ABNORMAL LOW (ref >60)
GFR calc non Af Amer: 40 mL/min — ABNORMAL LOW (ref >60)
Glucose, Bld: 177 mg/dL — ABNORMAL HIGH (ref 70–99)
Potassium: 3.5 mmol/L (ref 3.5–5.1)
Sodium: 135 mmol/L (ref 135–145)

## 2019-05-24 LAB — DIGOXIN LEVEL: Digoxin Level: 0.6 ng/mL — ABNORMAL LOW (ref 0.8–2.0)

## 2019-05-24 LAB — MAGNESIUM: Magnesium: 2.2 mg/dL (ref 1.7–2.4)

## 2019-05-24 NOTE — Progress Notes (Signed)
Patient ID: Gary Hutchinson, male   DOB: [birth year], 58 y.o.   MRN: [id number]    Advanced Heart Failure Clinic Note   PCP: Shon Baton EP: Thompson Grayer  HPI: Gary Hutchinson is a 58 y.o. male with a history of chronic systolic heart failure, ICM s/p ICD, HTN, DM II, OSA on CPAP, history of ventricular tachycardia status post AICD placement in 2009, atrial fibrillation and morbid obesity.    Admitted 6/1-03/13/14 for A/C HF and cardiogenic shock (co-ox 42%). Diuresed with IV lasix and milrinone which were weaned off.  He went into Afib with RVR and chemically cardioverted back to NSR with IV amiodarone. Placed on Xarelto. BB restarted 1/2 dose and held ACE-I. His blood sugars were elevated along with Hgb A1C and started on insulin. Discharge weight 344 lbs.   RHC (05/2014): Left sided filling pressures well compensated, mild pulm HTN with elevated right-sided pressures and mod/severely depressed CO. Discussion about trial of milrinone but patient wanted to hold off.  Admitted January 2017 with volume overload and cellulitis. Diuresed with IV lasix and required short term milrinone. Loaded on amio and had successful DC-CV on  10/17/2015.  Discharge weight was 307 pounds.   Admitted 2/18 with Influenza A/B. 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 Seen by EP and managed conservatively. Received IVF in hospital for sepsis but not diuresed back down afterward. Hydralazine and Imdur also stopped.   S/p successful DCCV 07/29/2017.  Today he returns for HF follow up.Overall feeling fine. Denies SOB/PND/Orthopnea. Walking 02-5999 steps a day. Appetite ok. No fever or chills. Weight at home 281-282  pounds. Using Bipap every night. Taking all medications.   Optivol: Unable to obtain.    05/03/12: EF 25-30%.  Grade 1 diastolic dysfunction.  Mild MR.  Mod dilated LA.   07/27/13: EF 40%, diff HK, MR mild, LA mild/mod dilated  03/2014: EF30-35%, grade II DD, mild MR, RV systolic fx mildly  decreased 10/2014: EF 40%.  10/2015: EF ~40%. RV mildly dilated.  8/17 EF 25-35% 07/27/2017 EF  25- 30%   SH: Lives with son in [place]. Disabled. No ETOH or tobacco abuse  FH; Mother living: HT        Father deceased: was not part of his life so not sure health issues  Review of systems complete and found to be negative unless listed in HPI.   Past Medical History:  Diagnosis Date  . AICD (automatic cardioverter/defibrillator) present    Medtronic  . Alcohol abuse    now quit  . Anemia    iron defi  . Arthritis    "fingers, knees, some days all my joints ache" (11/16/2015)  . Asthma   . Cholecystitis   . Chronic systolic congestive heart failure ([place])    a. RHC (02/2011) RA 31/27, RV 71/27, PA67/45, PCWP 46, PA 49%, Fick CO: 3.4 b. ECHO (03/2014) EF 30-35%, grade II DD, mild MR, RV poorly visualized appears mildly decreased c. RHC (05/2014) RA 13, RV 54/4/11, PA 60/22 (37), PCWP 17, Fick CO/CI: 4.4 / 1.9, Thermo CO/CI: 3.8 /1.6, PVR 5.2 WU, PA 57% and 59%  . Diverticulosis of colon   . Gout   . Hemorrhoids, internal   . Hyperlipidemia   . Hypertension   . Morbid obesity ([place])   . Nonischemic cardiomyopathy ([place])    a. LHC (02/2011) normal coronaries  . OSA on CPAP   . Paroxysmal atrial fibrillation (H[facility])   . Pneumonia 2000s X 1  .  Pulmonary hypertension (HCC)   . Renal insufficiency   . Type II diabetes mellitus (HCC)   . Ventricular tachycardia [facility](HCC)    s/p MDT ICD implant    Current Outpatient Medications  Medication Sig Dispense Refill  . acetaminophen (TYLENOL) 500 MG tablet Take 500 mg by mouth every 6 (six) hours as needed for moderate pain or headache.     . albuterol (PROAIR HFA) 108 (90 BASE) MCG/ACT inhaler Inhale 2 puffs into the lungs every 6 (six) hours as needed for wheezing or shortness of breath. Reported on 02/06/2016    . allopurinol (ZYLOPRIM) 300 MG tablet Take 300 mg by mouth daily.    Marland Kitchen. atorvastatin (LIPITOR) 40 MG tablet TAKE 1 TABLET (40 MG TOTAL)  BY MOUTH DAILY. 90 tablet 2  . colchicine 0.6 MG tablet Take 0.6 mg by mouth daily as needed (for gout flares).     Marland Kitchen. digoxin (LANOXIN) 0.25 MG tablet TAKE 1/2 TABLET EVERY OTHER DAY 23 tablet 7  . ENTRESTO 49-51 MG TAKE 1 TABLET BY MOUTH 2 (TWO) TIMES DAILY. 180 tablet 3  . Fluticasone-Salmeterol (ADVAIR DISKUS) 250-50 MCG/DOSE AEPB Inhale 1 puff into the lungs every 12 (twelve) hours as needed (for flares/shortness of breath). Reported on 02/06/2016    . hydrALAZINE (APRESOLINE) 25 MG tablet TAKE 1.5 TABLETS (37.5 MG TOTAL) BY MOUTH 3 (THREE) TIMES DAILY. 405 tablet 3  . insulin detemir (LEVEMIR) 100 UNIT/ML injection Inject 36 Units into the skin at bedtime.     . isosorbide mononitrate (IMDUR) 30 MG 24 hr tablet TAKE 1 TABLET (30 MG TOTAL) BY MOUTH 2 TIMES DAILY. 180 tablet 3  . methocarbamol (ROBAXIN) 500 MG tablet Take 1 tablet (500 mg total) by mouth 2 (two) times daily. 20 tablet 0  . metolazone (ZAROXOLYN) 2.5 MG tablet Take 2.5 mg by mouth daily as needed (for edema). Reported on 02/06/2016    . montelukast (SINGULAIR) 10 MG tablet Take 10 mg by mouth daily as needed (for shortness of breath, wheezing, or flares).     . rivaroxaban (XARELTO) 20 MG TABS tablet TAKE 1 TABLET (20 MG TOTAL) BY MOUTH DAILY WITH SUPPER. 90 tablet 3  . sotalol (BETAPACE) 80 MG tablet TAKE 1 TABLET (80 MG TOTAL) BY MOUTH 2 (TWO) TIMES DAILY. 180 tablet 3  . spironolactone (ALDACTONE) 25 MG tablet Take 12.5 mg by mouth daily.     Marland Kitchen. torsemide (DEMADEX) 20 MG tablet Take 2 tablets (40 mg total) by mouth every morning AND 1 tablet (20 mg total) at bedtime. 270 tablet 1   No current facility-administered medications for this encounter.    Vitals:   05/24/19 1015  BP: 118/65  Pulse: 75  SpO2: 98%  Weight: 128.2 kg   Wt Readings from Last 3 Encounters:  05/24/19 128.2 kg  03/10/19 133.8 kg  01/05/19 132 kg    General:  Well appearing. No resp difficulty HEENT: normal Neck: supple. no JVD. Carotids 2+ bilat; no  bruits. No lymphadenopathy or thryomegaly appreciated. Cor: PMI nondisplaced. Regular rate & rhythm. No rubs, gallops or murmurs. Lungs: clear Abdomen: obese, soft, nontender, nondistended. No hepatosplenomegaly. No bruits or masses. Good bowel sounds. Extremities: no cyanosis, clubbing, rash, edema Neuro: alert & orientedx3, cranial nerves grossly intact. moves all 4 extremities w/o difficulty. Affect pleasant   ASSESSMENT & PLAN:   1) Chronic systolic HF: NICM s/p BiV Medtronic - Echo 07/2017 LVEF 25-30%. Repeat ECHO next visit.  - NYHA II. Volume status stable on exam.  -  Continue torsemide 40 mg/20 mg.  - Continue entresto 49-51 twice a day.  - Off bisoprolol in 2018. On sotalol.  - Continue digoxin 0.0625 mg every other day.  - Continue hydralazine 37.5 mg TID.  - Continue imdur 30 mg BID.  - Check BMET today.  2) CKD stage III - Followed by nephrology - Check BMET today. Marland Kitchen  3) HTN  - Stable.  4) OSA  - Continue Bipap  5) PAF/AFL-  S/P multiple DCCVs. Most recent 07/29/17  - Had episode of AFL with ICD shock in 2/18 at time of influenza - Pt refused Ablation consideration.  - Continue xarelto 20 mg daily. Denies bleeding.  - Continue Sotalol per EP.  6) -  Obesity:   - Body mass index is 45.61 kg/m.  - Weight has been trending down at home. Discussed portion control.  7) Amiodarone thyroxicity   - Off Amiodarone. Completed Methimazole treatment per PCP.   - No change.   Check BMEt/Mag today. Follow up in 6 months with Dr Gala Romney,    Tonye Becket, NP  05/24/2019 10:20 AM

## 2019-05-24 NOTE — Patient Instructions (Addendum)
Labs were done today. We will call you with any ABNORMAL results. No news is good news!  Your physician has requested that you have an echocardiogram. Echocardiography is a painless test that uses sound waves to create images of your heart. It provides your doctor with information about the size and shape of your heart and how well your heart's chambers and valves are working. This procedure takes approximately one hour. There are no restrictions for this procedure.  Your physician wants you to follow-up in: 6 MONTHS. You will receive a reminder letter in the mail two months in advance. If you don't receive a letter, please call our office to schedule the follow-up appointment.   At the [facility], you and your health needs are our priority. As part of our continuing mission to provide you with exceptional heart care, we have created designated Provider Care Teams. These Care Teams include your primary Cardiologist (physician) and Advanced Practice Providers (APPs- Physician Assistants and Nurse Practitioners) who all work together to provide you with the care you need, when you need it.   You may see any of the following providers on your designated Care Team at your next follow up: Marland Kitchen Dr Glori Bickers . Dr Loralie Champagne . Darrick Grinder, NP   Please be sure to bring in all your medications bottles to every appointment.

## 2019-06-16 ENCOUNTER — Other Ambulatory Visit (HOSPITAL_COMMUNITY): Payer: Self-pay

## 2019-06-16 MED ORDER — HYDRALAZINE HCL 25 MG PO TABS: 37.5000 mg | ORAL_TABLET | Freq: Three times a day (TID) | ORAL | 3 refills | Status: DC

## 2019-07-15 ENCOUNTER — Ambulatory Visit (INDEPENDENT_AMBULATORY_CARE_PROVIDER_SITE_OTHER): Payer: Medicare Other | Admitting: *Deleted

## 2019-07-15 DIAGNOSIS — I472 Ventricular tachycardia, unspecified: Secondary | ICD-10-CM

## 2019-07-15 DIAGNOSIS — I447 Left bundle-branch block, unspecified: Secondary | ICD-10-CM

## 2019-07-15 LAB — CUP PACEART REMOTE DEVICE CHECK
Battery Remaining Longevity: 54 mo
Battery Voltage: 2.97 V
Brady Statistic AP VP Percent: 5.37 %
Brady Statistic AP VS Percent: 0.12 %
Brady Statistic AS VP Percent: 92.9 %
Brady Statistic AS VS Percent: 1.61 %
Brady Statistic RA Percent Paced: 5.48 %
Brady Statistic RV Percent Paced: 1.53 %
Date Time Interrogation Session: 20201009083722
HighPow Impedance: 78 Ohm
Implantable Lead Implant Date: 20090901
Implantable Lead Implant Date: 20170210
Implantable Lead Implant Date: 20170210
Implantable Lead Location: 753858
Implantable Lead Location: 753859
Implantable Lead Location: 753860
Implantable Lead Model: 4598
Implantable Lead Model: 5076
Implantable Lead Model: 6935
Implantable Pulse Generator Implant Date: 20170210
Lead Channel Impedance Value: 304 Ohm
Lead Channel Impedance Value: 399 Ohm
Lead Channel Impedance Value: 456 Ohm
Lead Channel Impedance Value: 456 Ohm
Lead Channel Impedance Value: 475 Ohm
Lead Channel Impedance Value: 475 Ohm
Lead Channel Impedance Value: 551 Ohm
Lead Channel Impedance Value: 551 Ohm
Lead Channel Impedance Value: 760 Ohm
Lead Channel Impedance Value: 817 Ohm
Lead Channel Impedance Value: 931 Ohm
Lead Channel Impedance Value: 931 Ohm
Lead Channel Impedance Value: 950 Ohm
Lead Channel Pacing Threshold Amplitude: 0.875 V
Lead Channel Pacing Threshold Amplitude: 0.875 V
Lead Channel Pacing Threshold Amplitude: 1 V
Lead Channel Pacing Threshold Pulse Width: 0.4 ms
Lead Channel Pacing Threshold Pulse Width: 0.4 ms
Lead Channel Pacing Threshold Pulse Width: 0.4 ms
Lead Channel Sensing Intrinsic Amplitude: 14 mV
Lead Channel Sensing Intrinsic Amplitude: 14 mV
Lead Channel Sensing Intrinsic Amplitude: 2.125 mV
Lead Channel Sensing Intrinsic Amplitude: 2.125 mV
Lead Channel Setting Pacing Amplitude: 1.75 V
Lead Channel Setting Pacing Amplitude: 2 V
Lead Channel Setting Pacing Amplitude: 2 V
Lead Channel Setting Pacing Pulse Width: 0.4 ms
Lead Channel Setting Pacing Pulse Width: 0.4 ms
Lead Channel Setting Sensing Sensitivity: 0.3 mV

## 2019-07-18 ENCOUNTER — Ambulatory Visit (INDEPENDENT_AMBULATORY_CARE_PROVIDER_SITE_OTHER): Payer: Medicare Other

## 2019-07-18 DIAGNOSIS — Z95810 Presence of automatic (implantable) cardiac defibrillator: Secondary | ICD-10-CM

## 2019-07-18 DIAGNOSIS — I5022 Chronic systolic (congestive) heart failure: Secondary | ICD-10-CM

## 2019-07-19 ENCOUNTER — Telehealth: Payer: Self-pay

## 2019-07-19 NOTE — Telephone Encounter (Signed)
Remote ICM transmission received.  Attempted call to patient regarding ICM remote transmission and left message to return call   

## 2019-07-19 NOTE — Progress Notes (Addendum)
Returned call to patient as requested by voice mail message.  He said he is doing fine and denies fluid symptoms.  Weight is stable at 286 lbs. No changes and encouraged to call if experiencing any fluid symptoms.

## 2019-07-19 NOTE — Progress Notes (Signed)
EPIC Encounter for ICM Monitoring  Patient Name: ALEKS NAWROT is a 58 y.o. male Date: 07/19/2019 Primary Care Physican: Shon Baton, MD Primary Cardiologist:[name] Electrophysiologist: Allred Bi-V Pacing: 97.8% 05/20/2019 Weight: 284 lbs  Attempted call to patient and unable to reach.  Left message to return call. Transmission reviewed.    Optivol thoracic impedancenormal.  Prescribed dosage:Torsemide 20 mg 2 tablets (40 mg total)every morning and 1 tablet (20 mg total) every evening.Metolazone 2.5 mg 1 tablet as needed.  Labs: 01/06/2020Creatinine 1.54, BUN40, Potassium 4.0, Sodium 133, GFR 49-57  Recommendations: Unable to reach.    Follow-up plan: ICM clinic phone appointment on 08/22/2019.   91 day device clinic remote transmission 10/14/2019.   Was due to make EP appointment with [facility] 06/2019.  Copy of ICM check sent to Dr. Rayann Heman.   3 month ICM trend: 07/15/2019    1 Year ICM trend:       [name], RN 07/19/2019 3:25 PM

## 2019-07-26 NOTE — Progress Notes (Signed)
Remote ICD transmission.   

## 2019-08-22 ENCOUNTER — Ambulatory Visit (INDEPENDENT_AMBULATORY_CARE_PROVIDER_SITE_OTHER): Payer: Medicare Other

## 2019-08-22 DIAGNOSIS — I5022 Chronic systolic (congestive) heart failure: Secondary | ICD-10-CM | POA: Diagnosis not present

## 2019-08-22 DIAGNOSIS — Z95810 Presence of automatic (implantable) cardiac defibrillator: Secondary | ICD-10-CM

## 2019-08-24 NOTE — Progress Notes (Signed)
EPIC Encounter for ICM Monitoring  Patient Name: Gary Hutchinson is a 58 y.o. male Date: 08/24/2019 Primary Care Physican: Shon Baton, MD Primary Cardiologist:[name] Electrophysiologist: Allred Bi-V Pacing: 96.8% 08/24/2019 Weight: 288 lbs  Spoke with patient and he is asymptomatic for fluid accumulation.    Optivol thoracic impedancenormal.  Prescribed dosage:Torsemide 20 mg 2 tablets (40 mg total)every morning and 1 tablet (20 mg total) every evening.Metolazone 2.5 mg 1 tablet as needed.  Labs: 05/24/2019 Creatinine 1.83, BUN 58, Potassium 3.5, Sodium 135, GFR 40-46 01/06/2020Creatinine 1.54, BUN40, Potassium 4.0, Sodium 133, GFR 49-57  Recommendations: No changes and encouraged to call if experiencing any fluid symptoms.  Follow-up plan: ICM clinic phone appointment on 09/26/2019.   91 day device clinic remote transmission 10/14/2019.    Copy of ICM check sent to Dr. Rayann Heman.   3 month ICM trend: 08/22/2019    1 Year ICM trend:       [name], RN 08/24/2019 9:37 AM

## 2019-09-26 ENCOUNTER — Ambulatory Visit (INDEPENDENT_AMBULATORY_CARE_PROVIDER_SITE_OTHER): Payer: Medicare Other

## 2019-09-26 ENCOUNTER — Other Ambulatory Visit (HOSPITAL_COMMUNITY): Payer: Self-pay | Admitting: Internal Medicine

## 2019-09-26 DIAGNOSIS — I5022 Chronic systolic (congestive) heart failure: Secondary | ICD-10-CM

## 2019-09-26 DIAGNOSIS — Z95810 Presence of automatic (implantable) cardiac defibrillator: Secondary | ICD-10-CM

## 2019-09-27 NOTE — Progress Notes (Signed)
EPIC Encounter for ICM Monitoring  Patient Name: Gary Hutchinson is a 58 y.o. male Date: 09/27/2019 Primary Care Physican: Shon Baton, MD Primary Cardiologist:[name] Electrophysiologist: Allred Bi-V Pacing: 97.7% 09/23/2019 Weight: 286 lbs  Spoke with patient and he is asymptomatic for fluid accumulation.  He had about 9 teeth pulled and eating soups since he is limited the foods he can eat.   Optivol thoracic impedancenormal but was suggesting fluid accumulation from 12/1 - 12/20.  Prescribed dosage:Torsemide 20 mg 2 tablets (40 mg total)every morning and 1 tablet(20 mg total)every evening.Metolazone 2.5 mg 1 tablet as needed.  Labs: 05/24/2019 Creatinine 1.83, BUN 58, Potassium 3.5, Sodium 135, GFR 40-46 01/06/2020Creatinine 1.54, BUN40, Potassium 4.0, Sodium 133, GFR 49-57  Recommendations: No changes and encouraged to call if experiencing any fluid symptoms.  Follow-up plan: ICM clinic phone appointment on 10/31/2019.   91 day device clinic remote transmission 10/14/2019.    Copy of ICM check sent to Dr. Rayann Heman.   3 month ICM trend: 09/26/2019    1 Year ICM trend:       [name], RN 09/27/2019 1:38 PM

## 2019-09-28 ENCOUNTER — Other Ambulatory Visit (HOSPITAL_COMMUNITY): Payer: Self-pay | Admitting: Internal Medicine

## 2019-10-14 ENCOUNTER — Ambulatory Visit: Payer: Medicare Other

## 2019-10-14 ENCOUNTER — Ambulatory Visit (INDEPENDENT_AMBULATORY_CARE_PROVIDER_SITE_OTHER): Payer: Medicare Other | Admitting: *Deleted

## 2019-10-14 DIAGNOSIS — Z95810 Presence of automatic (implantable) cardiac defibrillator: Secondary | ICD-10-CM

## 2019-10-14 DIAGNOSIS — I447 Left bundle-branch block, unspecified: Secondary | ICD-10-CM | POA: Diagnosis not present

## 2019-10-14 DIAGNOSIS — I5022 Chronic systolic (congestive) heart failure: Secondary | ICD-10-CM

## 2019-10-14 LAB — CUP PACEART REMOTE DEVICE CHECK
Battery Remaining Longevity: 47 mo
Battery Voltage: 2.97 V
Brady Statistic AP VP Percent: 5.42 %
Brady Statistic AP VS Percent: 0.12 %
Brady Statistic AS VP Percent: 92.72 %
Brady Statistic AS VS Percent: 1.74 %
Brady Statistic RA Percent Paced: 5.53 %
Brady Statistic RV Percent Paced: 0.84 %
Date Time Interrogation Session: 20210108022706
HighPow Impedance: 86 Ohm
Implantable Lead Implant Date: 20090901
Implantable Lead Implant Date: 20170210
Implantable Lead Implant Date: 20170210
Implantable Lead Location: 753858
Implantable Lead Location: 753859
Implantable Lead Location: 753860
Implantable Lead Model: 4598
Implantable Lead Model: 5076
Implantable Lead Model: 6935
Implantable Pulse Generator Implant Date: 20170210
Lead Channel Impedance Value: 304 Ohm
Lead Channel Impedance Value: 399 Ohm
Lead Channel Impedance Value: 399 Ohm
Lead Channel Impedance Value: 418 Ohm
Lead Channel Impedance Value: 456 Ohm
Lead Channel Impedance Value: 475 Ohm
Lead Channel Impedance Value: 513 Ohm
Lead Channel Impedance Value: 551 Ohm
Lead Channel Impedance Value: 703 Ohm
Lead Channel Impedance Value: 722 Ohm
Lead Channel Impedance Value: 836 Ohm
Lead Channel Impedance Value: 874 Ohm
Lead Channel Impedance Value: 874 Ohm
Lead Channel Pacing Threshold Amplitude: 0.875 V
Lead Channel Pacing Threshold Amplitude: 0.875 V
Lead Channel Pacing Threshold Amplitude: 1 V
Lead Channel Pacing Threshold Pulse Width: 0.4 ms
Lead Channel Pacing Threshold Pulse Width: 0.4 ms
Lead Channel Pacing Threshold Pulse Width: 0.4 ms
Lead Channel Sensing Intrinsic Amplitude: 2.25 mV
Lead Channel Sensing Intrinsic Amplitude: 2.25 mV
Lead Channel Sensing Intrinsic Amplitude: 8.625 mV
Lead Channel Sensing Intrinsic Amplitude: 8.625 mV
Lead Channel Setting Pacing Amplitude: 1.75 V
Lead Channel Setting Pacing Amplitude: 2 V
Lead Channel Setting Pacing Amplitude: 2 V
Lead Channel Setting Pacing Pulse Width: 0.4 ms
Lead Channel Setting Pacing Pulse Width: 0.4 ms
Lead Channel Setting Sensing Sensitivity: 0.3 mV

## 2019-10-14 NOTE — Progress Notes (Signed)
EPIC Encounter for ICM Monitoring  Patient Name: Gary Hutchinson is a 59 y.o. male Date: 10/14/2019 Primary Care Physican: Creola Corn, MD Primary Cardiologist:[name] Electrophysiologist: Allred Bi-V Pacing: 97.7% 09/23/2019 Weight: 286lbs 10/14/2019 Weight: 298 lbs  Spoke with patient and he is symptomatic with 8-10 lb weight gain.  Optivol thoracic impedancenormal but was suggesting fluid accumulation since 12/1.  Prescribed dosage:Torsemide 20 mg 2 tablets (40 mg total)every morning and 1 tablet(20 mg total)every evening.Metolazone 2.5 mg 1 tablet as needed.  Labs: 05/24/2019 Creatinine 1.83, BUN 58, Potassium 3.5, Sodium 135, GFR 40-46 01/06/2020Creatinine 1.54, BUN40, Potassium 4.0, Sodium 133, GFR 49-57  Recommendations:  Patient reported he took Metolazone this AM due to weight gain.   Follow-up plan: ICM clinic phone appointment on 10/17/2019 (manual send) to recheck fluid levels.   91 day device clinic remote transmission 10/14/2019.      Copy of ICM check sent to Dr. [name] and Dr Gala Romney.   3 month ICM trend: 10/14/2019    1 Year ICM trend:       Karie Soda, RN 10/14/2019 10:31 AM

## 2019-10-18 ENCOUNTER — Telehealth: Payer: Self-pay

## 2019-10-18 NOTE — Telephone Encounter (Signed)
Left message for patient to remind of missed remote transmission.  

## 2019-10-31 ENCOUNTER — Ambulatory Visit (INDEPENDENT_AMBULATORY_CARE_PROVIDER_SITE_OTHER): Payer: Medicare Other

## 2019-10-31 DIAGNOSIS — I5022 Chronic systolic (congestive) heart failure: Secondary | ICD-10-CM

## 2019-10-31 DIAGNOSIS — Z95810 Presence of automatic (implantable) cardiac defibrillator: Secondary | ICD-10-CM | POA: Diagnosis not present

## 2019-11-02 NOTE — Progress Notes (Signed)
EPIC Encounter for ICM Monitoring  Patient Name: Gary Hutchinson is a 59 y.o. male Date: 11/02/2019 Primary Care Physican: Creola Corn, MD Primary Cardiologist:[name] Electrophysiologist: Allred Bi-V Pacing: 97.6% 11/02/2019 Weight: 288 lbs   Spoke with patient and he is doing well at this time.  Optivol thoracic impedancenormal.  Prescribed dosage:  Torsemide 20 mg 2 tablets (40 mg total)every morning and 1 tablet(20 mg total)every evening.  Metolazone 2.5 mg 1 tablet as needed.  Labs: 05/24/2019 Creatinine 1.83, BUN 58, Potassium 3.5, Sodium 135, GFR 40-46 01/06/2020Creatinine 1.54, BUN40, Potassium 4.0, Sodium 133, GFR 49-57  Recommendations:  No changes and encouraged to call if experiencing any fluid symptoms.  Follow-up plan: ICM clinic phone appointment on 12/05/2019.   91 day device clinic remote transmission 12/14/2019.  Office appt 01/13/2020 with Dr. Gala Romney.    Copy of ICM check sent to Dr. [name].   3 month ICM trend: 10/31/2019    1 Year ICM trend:       Karie Soda, RN 11/02/2019 11:17 AM

## 2019-11-08 ENCOUNTER — Other Ambulatory Visit (HOSPITAL_COMMUNITY): Payer: Self-pay | Admitting: Internal Medicine

## 2019-12-05 ENCOUNTER — Ambulatory Visit (INDEPENDENT_AMBULATORY_CARE_PROVIDER_SITE_OTHER): Payer: Medicare Other

## 2019-12-05 DIAGNOSIS — Z95810 Presence of automatic (implantable) cardiac defibrillator: Secondary | ICD-10-CM | POA: Diagnosis not present

## 2019-12-05 DIAGNOSIS — I5022 Chronic systolic (congestive) heart failure: Secondary | ICD-10-CM | POA: Diagnosis not present

## 2019-12-07 NOTE — Progress Notes (Signed)
EPIC Encounter for ICM Monitoring  Patient Name: Gary Hutchinson is a 59 y.o. male Date: 12/07/2019 Primary Care Physican: Creola Corn, MD Primary Cardiologist:[name] Electrophysiologist: Allred Bi-V Pacing: 97.6% 12/07/2019 Weight: 294 lbs   Spoke with patient and he is doing well at this time.  He has been gaining weight but does not seem to be fluid weight.   Optivol thoracic impedancenormal.  Prescribed dosage:  Torsemide 20 mg 2 tablets (40 mg total)every morning and 1 tablet(20 mg total)every evening.  Metolazone 2.5 mg 1 tablet as needed.  Labs: 05/24/2019 Creatinine 1.83, BUN 58, Potassium 3.5, Sodium 135, GFR 40-46 01/06/2020Creatinine 1.54, BUN40, Potassium 4.0, Sodium 133, GFR 49-57  Recommendations:  No changes and encouraged to call if experiencing any fluid symptoms.  Follow-up plan: ICM clinic phone appointment on 01/16/2020.   91 day device clinic remote transmission 01/13/2020.  Office appt 12/14/2019 with Dr. Gala Romney.    Copy of ICM check sent to Dr. [name].   3 month ICM trend: 12/05/2019    1 Year ICM trend:       Karie Soda, RN 12/07/2019 5:07 PM

## 2019-12-13 ENCOUNTER — Telehealth (HOSPITAL_COMMUNITY): Payer: Self-pay

## 2019-12-13 NOTE — Telephone Encounter (Signed)

## 2019-12-14 ENCOUNTER — Other Ambulatory Visit: Payer: Self-pay

## 2019-12-14 ENCOUNTER — Ambulatory Visit (HOSPITAL_BASED_OUTPATIENT_CLINIC_OR_DEPARTMENT_OTHER)
Admission: RE | Admit: 2019-12-14 | Discharge: 2019-12-14 | Disposition: A | Discharge status: Home / Self Care | Payer: Medicare Other | Source: Ambulatory Visit | Attending: Internal Medicine | Admitting: Internal Medicine

## 2019-12-14 ENCOUNTER — Ambulatory Visit (HOSPITAL_COMMUNITY)
Admission: RE | Admit: 2019-12-14 | Discharge: 2019-12-14 | Disposition: A | Discharge status: Home / Self Care | Payer: Medicare Other | Source: Ambulatory Visit | Attending: Internal Medicine | Admitting: Internal Medicine

## 2019-12-14 VITALS — BP 142/71 | HR 67 | Wt 299.0 lb

## 2019-12-14 DIAGNOSIS — N1832 Chronic kidney disease, stage 3b: Secondary | ICD-10-CM | POA: Diagnosis not present

## 2019-12-14 DIAGNOSIS — I5022 Chronic systolic (congestive) heart failure: Secondary | ICD-10-CM | POA: Diagnosis present

## 2019-12-14 DIAGNOSIS — Z95810 Presence of automatic (implantable) cardiac defibrillator: Secondary | ICD-10-CM | POA: Diagnosis not present

## 2019-12-14 DIAGNOSIS — I10 Essential (primary) hypertension: Secondary | ICD-10-CM | POA: Diagnosis not present

## 2019-12-14 DIAGNOSIS — I447 Left bundle-branch block, unspecified: Secondary | ICD-10-CM | POA: Insufficient documentation

## 2019-12-14 DIAGNOSIS — G4733 Obstructive sleep apnea (adult) (pediatric): Secondary | ICD-10-CM | POA: Diagnosis not present

## 2019-12-14 DIAGNOSIS — E785 Hyperlipidemia, unspecified: Secondary | ICD-10-CM | POA: Insufficient documentation

## 2019-12-14 DIAGNOSIS — I4891 Unspecified atrial fibrillation: Secondary | ICD-10-CM | POA: Insufficient documentation

## 2019-12-14 DIAGNOSIS — I480 Paroxysmal atrial fibrillation: Secondary | ICD-10-CM | POA: Diagnosis not present

## 2019-12-14 DIAGNOSIS — I340 Nonrheumatic mitral (valve) insufficiency: Secondary | ICD-10-CM | POA: Diagnosis not present

## 2019-12-14 DIAGNOSIS — I110 Hypertensive heart disease with heart failure: Secondary | ICD-10-CM | POA: Diagnosis not present

## 2019-12-14 LAB — CBC
HCT: 40 % (ref 39.0–52.0)
Hemoglobin: 12.5 g/dL — ABNORMAL LOW (ref 13.0–17.0)
MCH: 27.4 pg (ref 26.0–34.0)
MCHC: 31.3 g/dL (ref 30.0–36.0)
MCV: 87.5 fL (ref 80.0–100.0)
Platelets: 193 10*3/uL (ref 150–400)
RBC: 4.57 MIL/uL (ref 4.22–5.81)
RDW: 15.2 % (ref 11.5–15.5)
WBC: 7.4 10*3/uL (ref 4.0–10.5)
nRBC: 0 % (ref 0.0–0.2)

## 2019-12-14 LAB — BASIC METABOLIC PANEL
Anion gap: 12 (ref 5–15)
BUN: 62 mg/dL — ABNORMAL HIGH (ref 6–20)
CO2: 25 mmol/L (ref 22–32)
Calcium: 9.4 mg/dL (ref 8.9–10.3)
Chloride: 99 mmol/L (ref 98–111)
Creatinine, Ser: 1.73 mg/dL — ABNORMAL HIGH (ref 0.61–1.24)
GFR calc Af Amer: 49 mL/min — ABNORMAL LOW (ref >60)
GFR calc non Af Amer: 43 mL/min — ABNORMAL LOW (ref >60)
Glucose, Bld: 155 mg/dL — ABNORMAL HIGH (ref 70–99)
Potassium: 3.8 mmol/L (ref 3.5–5.1)
Sodium: 136 mmol/L (ref 135–145)

## 2019-12-14 LAB — BRAIN NATRIURETIC PEPTIDE: B Natriuretic Peptide: 38.9 pg/mL (ref 0.0–100.0)

## 2019-12-14 MED ORDER — PERFLUTREN LIPID MICROSPHERE
1.0000 mL | INTRAVENOUS | Status: DC | PRN
  Administered 2019-12-14: 09:00:00 4 mL via INTRAVENOUS
  Filled 2019-12-14: qty 10

## 2019-12-14 NOTE — Progress Notes (Signed)
Echocardiogram 2D Echocardiogram has been performed.  Warren Lacy Wynetta Seith 12/14/2019, 9:15 AM

## 2019-12-14 NOTE — Addendum Note (Signed)
Encounter addended by: Noralee Space, RN on: 12/14/2019 10:41 AM  Actions taken: Order list changed, Diagnosis association updated, Clinical Note Signed, Charge Capture section accepted

## 2019-12-14 NOTE — Patient Instructions (Signed)
Labs done today, your results will be available in MyChart, we will contact you for abnormal readings.  Please call our office in September to schedule your follow up appointment  If you have any questions or concerns before your next appointment please send [place] a message through [place] or call our office at [phone].  At the [facility], you and your health needs are our priority. As part of our continuing mission to provide you with exceptional heart care, we have created designated Provider Care Teams. These Care Teams include your primary Cardiologist (physician) and Advanced Practice Providers (APPs- Physician Assistants and Nurse Practitioners) who all work together to provide you with the care you need, when you need it.   You may see any of the following providers on your designated Care Team at your next follow up: Marland Kitchen Dr Arvilla Meres . Dr Marca Ancona . Tonye Becket, NP . Robbie Lis, PA . Karle Plumber, PharmD   Please be sure to bring in all your medications bottles to every appointment.

## 2019-12-14 NOTE — Progress Notes (Signed)
Patient ID: Gary Hutchinson, male   DOB: August 18, 1961, 59 y.o.   MRN: [id number]    Advanced Heart Failure Clinic Note   PCP: Gary Hutchinson EP: Gary Hutchinson  HPI: Gary Hutchinson is a 59 y.o. male with a history of chronic systolic heart failure, ICM s/p ICD, HTN, DM II, OSA on CPAP, history of ventricular tachycardia status post AICD placement in 2009, atrial fibrillation and morbid obesity.    Admitted 6/1-03/13/14 for A/C HF and cardiogenic shock (co-ox 42%). Diuresed with IV lasix and milrinone which were weaned off.  He went into Afib with RVR and chemically cardioverted back to NSR with IV amiodarone. Placed on Xarelto. BB restarted 1/2 dose and held ACE-I. His blood sugars were elevated along with Hgb A1C and started on insulin. Discharge weight 344 lbs.   RHC (05/2014): Left sided filling pressures well compensated, mild pulm HTN with elevated right-sided pressures and mod/severely depressed CO. Discussion about trial of milrinone but patient wanted to hold off.  Admitted January 2017 with volume overload and cellulitis. Diuresed with IV lasix and required short term milrinone. Loaded on amio and had successful DC-CV on  10/17/2015.  Discharge weight was 307 pounds.   Admitted 2/18 with Influenza A/B. 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 Seen by EP and managed conservatively. Received IVF in hospital for sepsis but not diuresed back down afterward. Hydralazine and Imdur also stopped.   S/p successful DCCV 07/29/2017.  Today he returns for HF follow up. Says he is feeling ok but has been a hard year with COVID restrictions. Breathing ok, no edema, orthopnea or PND. Fluid level up and down but follows closely with Gary Hutchinson. And adjusts diuretic and add a metolazone when she calls -  About 1-2x/month. No dizziness. No bleeding with AC. Weight up 15 pounds.   Echo today EF ~30% RV ok. Personally reviewed   Optivol: Unable to obtain.    05/03/12: EF 25-30%.  Grade 1  diastolic dysfunction.  Mild MR.  Mod dilated LA.   07/27/13: EF 40%, diff HK, MR mild, LA mild/mod dilated  03/2014: EF30-35%, grade II DD, mild MR, RV systolic fx mildly decreased 10/2014: EF 40%.  10/2015: EF ~40%. RV mildly dilated.  8/17 EF 25-35% 07/27/2017 EF  25- 30%   SH: Lives with son in [place]. Disabled. No ETOH or tobacco abuse  FH; Mother living: HT        Father deceased: was not part of his life so not sure health issues  Review of systems complete and found to be negative unless listed in HPI.   Past Medical History:  Diagnosis Date  . AICD (automatic cardioverter/defibrillator) present    Medtronic  . Alcohol abuse    now quit  . Anemia    iron defi  . Arthritis    "fingers, knees, some days all my joints ache" (11/16/2015)  . Asthma   . Cholecystitis   . Chronic systolic congestive heart failure (HCC)    a. RHC (02/2011) RA 31/27, RV 71/27, PA67/45, PCWP 46, PA 49%, Fick CO: 3.4 b. ECHO (03/2014) EF 30-35%, grade II DD, mild MR, RV poorly visualized appears mildly decreased c. RHC (05/2014) RA 13, RV 54/4/11, PA 60/22 (37), PCWP 17, Fick CO/CI: 4.4 / 1.9, Thermo CO/CI: 3.8 /1.6, PVR 5.2 WU, PA 57% and 59%  . Diverticulosis of colon   . Gout   . Hemorrhoids, internal   . Hyperlipidemia   . Hypertension   .  Morbid obesity (HCC)   . Nonischemic cardiomyopathy (HCC)    a. LHC (02/2011) normal coronaries  . OSA on CPAP   . Paroxysmal atrial fibrillation (HCC)   . Pneumonia 2000s X 1  . Pulmonary hypertension (HCC)   . Renal insufficiency   . Type II diabetes mellitus (HCC)   . Ventricular tachycardia [facility])    s/p MDT ICD implant    Current Outpatient Medications  Medication Sig Dispense Refill  . acetaminophen (TYLENOL) 500 MG tablet Take 500 mg by mouth every 6 (six) hours as needed for moderate pain or headache.     . albuterol (PROAIR HFA) 108 (90 BASE) MCG/ACT inhaler Inhale 2 puffs into the lungs every 6 (six) hours as needed for wheezing or shortness  of breath. Reported on 02/06/2016    . allopurinol (ZYLOPRIM) 300 MG tablet Take 300 mg by mouth daily.    Marland Kitchen atorvastatin (LIPITOR) 80 MG tablet Take 80 mg by mouth daily.    . colchicine 0.6 MG tablet Take 0.6 mg by mouth daily as needed (for gout flares).     Marland Kitchen digoxin (LANOXIN) 0.25 MG tablet TAKE 1/2 TABLET EVERY OTHER DAY 23 tablet 7  . ENTRESTO 49-51 MG TAKE 1 TABLET BY MOUTH 2 (TWO) TIMES DAILY. 180 tablet 3  . Fluticasone-Salmeterol (ADVAIR DISKUS) 250-50 MCG/DOSE AEPB Inhale 1 puff into the lungs every 12 (twelve) hours as needed (for flares/shortness of breath). Reported on 02/06/2016    . hydrALAZINE (APRESOLINE) 25 MG tablet Take 1.5 tablets (37.5 mg total) by mouth 3 (three) times daily. 405 tablet 3  . insulin detemir (LEVEMIR) 100 UNIT/ML injection Inject 40 Units into the skin at bedtime.     . insulin lispro (HUMALOG) 100 UNIT/ML injection Inject 5 Units into the skin 3 (three) times daily before meals.    . isosorbide mononitrate (IMDUR) 30 MG 24 hr tablet TAKE 1 TABLET (30 MG TOTAL) BY MOUTH 2 TIMES DAILY. 180 tablet 3  . metolazone (ZAROXOLYN) 2.5 MG tablet Take 2.5 mg by mouth daily as needed (for edema). Reported on 02/06/2016    . sotalol (BETAPACE) 80 MG tablet TAKE 1 TABLET (80 MG TOTAL) BY MOUTH 2 (TWO) TIMES DAILY. 180 tablet 3  . spironolactone (ALDACTONE) 25 MG tablet Take 12.5 mg by mouth daily.     Marland Kitchen torsemide (DEMADEX) 20 MG tablet TAKE 2 TABLETS (40 MG TOTAL) BY MOUTH EVERY MORNING AND 1 TABLET (20 MG TOTAL) AT BEDTIME. 270 tablet 1  . XARELTO 20 MG TABS tablet TAKE 1 TABLET (20 MG TOTAL) BY MOUTH DAILY WITH SUPPER. 90 tablet 3   No current facility-administered medications for this encounter.   Facility-Administered Medications Ordered in Other Encounters  Medication Dose Route Frequency Provider Last Rate Last Admin  . perflutren lipid microspheres (DEFINITY) IV suspension  1-10 mL Intravenous PRN Gary Hutchinson, Gary Buckles, MD   4 mL at 12/14/19 0915   Vitals:    12/14/19 0942  BP: (!) 142/71  Pulse: 67  SpO2: 98%  Weight: 135.6 kg (299 lb)   Wt Readings from Last 3 Encounters:  12/14/19 135.6 kg (299 lb)  05/24/19 128.2 kg (282 lb 9.6 oz)  03/10/19 133.8 kg (295 lb)    General:  Well appearing. No resp difficulty HEENT: normal Neck: supple. no JVD. Carotids 2+ bilat; no bruits. No lymphadenopathy or thryomegaly appreciated. Cor: PMI nondisplaced. Regular rate & rhythm. No rubs, gallops or murmurs. Lungs: clear Abdomen: obese soft, nontender, nondistended. No hepatosplenomegaly. No bruits or masses.  Good bowel sounds. Extremities: no cyanosis, clubbing, rash, edema Neuro: alert & orientedx3, cranial nerves grossly intact. moves all 4 extremities w/o difficulty. Affect pleasant  ECG: NSR 62 Personally reviewed   ASSESSMENT & PLAN:   1) Chronic systolic HF: NICM s/p BiV Medtronic - Echo 07/2017 LVEF 25-30%.  - Echo today 12/14/19 EF 30% Personally reviewed - NYHA II. Volume status stable on exam and Optivol despite weight gain - Continue torsemide 40 mg/20 mg.  - Continue entresto 49-51 twice a day. At next visit increase Entresto to 97/103 bid - Off bisoprolol in 2018. On sotalol.  - Continue digoxin 0.0625 mg every other day.  - Continue hydralazine 37.5 mg TID.  - Continue imdur 30 mg BID.  - Add Farxiga 10 - Check BMET today.  2) CKD stage IIIg - Followed by nephrology - Creatinine 1.5-1.9 - Check BMET today. Marland Kitchen  3) HTN  - Mildly elevated today - Add Farxiga 10 4) OSA  - Continue Bipap. Compliant  5) PAF/AFL-  S/P multiple DCCVs. Most recent 07/29/17  - Had episode of AFL with ICD shock in 2/18 at time of influenza - Pt refused Ablation consideration.  - Continue xarelto 20 mg daily. Denies bleeding.  - Continue Sotalol per EP.  - ECG in NST otday  6) -  Obesity:   - Body mass index is 48.26 kg/m.  - Weight has been trending up in setting of COVID restrictions. Encouraged him to be more active. 7) Amiodarone thyroxicity    - Off Amiodarone. Completed Methimazole treatment per PCP.   - No change 8) DM2 - Add Farxiga  Check BMEt/Mag today. Follow up in 6 months with Dr Haroldine Laws,    Glori Bickers, MD  12/14/2019 10:24 AM

## 2020-01-13 ENCOUNTER — Ambulatory Visit (INDEPENDENT_AMBULATORY_CARE_PROVIDER_SITE_OTHER): Payer: Medicare Other | Admitting: *Deleted

## 2020-01-13 DIAGNOSIS — I447 Left bundle-branch block, unspecified: Secondary | ICD-10-CM | POA: Diagnosis not present

## 2020-01-13 LAB — CUP PACEART REMOTE DEVICE CHECK
Battery Remaining Longevity: 41 mo
Battery Voltage: 2.97 V
Brady Statistic AP VP Percent: 5.92 %
Brady Statistic AP VS Percent: 0.13 %
Brady Statistic AS VP Percent: 92.17 %
Brady Statistic AS VS Percent: 1.78 %
Brady Statistic RA Percent Paced: 6.02 %
Brady Statistic RV Percent Paced: 0.94 %
Date Time Interrogation Session: 20210409023324
HighPow Impedance: 99 Ohm
Implantable Lead Implant Date: 20090901
Implantable Lead Implant Date: 20170210
Implantable Lead Implant Date: 20170210
Implantable Lead Location: 753858
Implantable Lead Location: 753859
Implantable Lead Location: 753860
Implantable Lead Model: 4598
Implantable Lead Model: 5076
Implantable Lead Model: 6935
Implantable Pulse Generator Implant Date: 20170210
Lead Channel Impedance Value: 1007 Ohm
Lead Channel Impedance Value: 1026 Ohm
Lead Channel Impedance Value: 342 Ohm
Lead Channel Impedance Value: 418 Ohm
Lead Channel Impedance Value: 456 Ohm
Lead Channel Impedance Value: 475 Ohm
Lead Channel Impedance Value: 475 Ohm
Lead Channel Impedance Value: 513 Ohm
Lead Channel Impedance Value: 551 Ohm
Lead Channel Impedance Value: 646 Ohm
Lead Channel Impedance Value: 779 Ohm
Lead Channel Impedance Value: 836 Ohm
Lead Channel Impedance Value: 988 Ohm
Lead Channel Pacing Threshold Amplitude: 0.75 V
Lead Channel Pacing Threshold Amplitude: 0.875 V
Lead Channel Pacing Threshold Amplitude: 1.125 V
Lead Channel Pacing Threshold Pulse Width: 0.4 ms
Lead Channel Pacing Threshold Pulse Width: 0.4 ms
Lead Channel Pacing Threshold Pulse Width: 0.4 ms
Lead Channel Sensing Intrinsic Amplitude: 12.625 mV
Lead Channel Sensing Intrinsic Amplitude: 12.625 mV
Lead Channel Sensing Intrinsic Amplitude: 2.25 mV
Lead Channel Sensing Intrinsic Amplitude: 2.25 mV
Lead Channel Setting Pacing Amplitude: 1.5 V
Lead Channel Setting Pacing Amplitude: 2 V
Lead Channel Setting Pacing Amplitude: 2.25 V
Lead Channel Setting Pacing Pulse Width: 0.4 ms
Lead Channel Setting Pacing Pulse Width: 0.4 ms
Lead Channel Setting Sensing Sensitivity: 0.3 mV

## 2020-01-13 NOTE — Progress Notes (Signed)
ICD Remote  

## 2020-01-16 ENCOUNTER — Ambulatory Visit (INDEPENDENT_AMBULATORY_CARE_PROVIDER_SITE_OTHER): Payer: Medicare Other

## 2020-01-16 DIAGNOSIS — Z95810 Presence of automatic (implantable) cardiac defibrillator: Secondary | ICD-10-CM | POA: Diagnosis not present

## 2020-01-16 DIAGNOSIS — I5022 Chronic systolic (congestive) heart failure: Secondary | ICD-10-CM

## 2020-01-16 NOTE — Progress Notes (Signed)
EPIC Encounter for ICM Monitoring  Patient Name: Gary Hutchinson is a 59 y.o. male Date: 01/16/2020 Primary Care Physican: Creola Corn, MD Primary Cardiologist:[name] Electrophysiologist: Allred Bi-V Pacing: 96.6% 12/07/2019 Weight: 294 lbs   Spoke with patient and reports feeling well at this time.  Denies fluid symptoms.    Optivol thoracic impedancenormal.    Prescribed dosage:  Torsemide 20 mg 2 tablets (40 mg total)every morning and 1 tablet(20 mg total)every evening.  Metolazone 2.5 mg 1 tablet as needed.  Labs: 12/14/2019 Creatinine 1.73, BUN 62, Potassium 3.8, Sodium 136, GFR 43-49 05/24/2019 Creatinine 1.83, BUN 58, Potassium 3.5, Sodium 135, GFR 40-46 01/06/2020Creatinine 1.54, BUN40, Potassium 4.0, Sodium 133, GFR 49-57  Recommendations:No changes and encouraged to call if experiencing any fluid symptoms.  Follow-up plan: ICM clinic phone appointment on5/17/2021. 91 day device clinic remote transmission 04/13/2020.    Copy of ICM check sent to Dr.Allred.   3 month ICM trend: 01/16/2020    1 Year ICM trend:       Karie Soda, RN 01/16/2020 4:11 PM

## 2020-01-22 NOTE — Progress Notes (Deleted)
Cardiology Office Note Date:  01/22/2020  Patient ID:  Gary Hutchinson, DOB [birth year], MRN [id number] PCP:  Shon Baton, MD  Cardiologist:  Dr. Haroldine Laws Electrophysiologist: Dr. Rayann Heman    Chief Complaint:  *** 6 mo EP/device visit  History of Present Illness: Gary Hutchinson is a 59 y.o. male with history of NICM, chronic CHF (systolic), ICD, DM, HTN, OSA w/CPAP, VT, AFib, hx of amio thyrotoxicity managed with his PMD on methimazole, morbid obesity.   He comes in today to be seen for Dr. Rayann Heman, last seen by him via video visit April 2020.  Was feeling well, no changes were made.  He saw Dr. Haroldine Laws 12/14/2019, had an echo done that day with LVEF 30%, RV was OK.  Planned for Entresto titration at his next visit.  farxiga was added. He is also followed by  ICM clinic RN.  *** symptoms *** sotalol EKG *** eliquis, bleeding, labs *** meds, CM .   Device information MDT ICD implanted 2009, upgrade to CRT-D 11/16/15, Dr. Rayann Heman  + inappropriate shock for rapid Aflutter (atypical) in the setting of illness w/flu Dec 2018, inappropriate shock for 1:1 AFlutter  AAD:  amiodarone developed thyrotoxicity Dec 2018 started on Sotalol  Past Medical History:  Diagnosis Date  . AICD (automatic cardioverter/defibrillator) present    Medtronic  . Alcohol abuse    now quit  . Anemia    iron defi  . Arthritis    "fingers, knees, some days all my joints ache" (11/16/2015)  . Asthma   . Cholecystitis   . Chronic systolic congestive heart failure ([place])    a. RHC (02/2011) RA 31/27, RV 71/27, PA67/45, PCWP 46, PA 49%, Fick CO: 3.4 b. ECHO (03/2014) EF 30-35%, grade II DD, mild MR, RV poorly visualized appears mildly decreased c. RHC (05/2014) RA 13, RV 54/4/11, PA 60/22 (37), PCWP 17, Fick CO/CI: 4.4 / 1.9, Thermo CO/CI: 3.8 /1.6, PVR 5.2 WU, PA 57% and 59%  . Diverticulosis of colon   . Gout   . Hemorrhoids, internal   . Hyperlipidemia   . Hypertension   . Morbid obesity ([place])   .  Nonischemic cardiomyopathy ([place])    a. LHC (02/2011) normal coronaries  . OSA on CPAP   . Paroxysmal atrial fibrillation (HCC)   . Pneumonia 2000s X 1  . Pulmonary hypertension ([place])   . Renal insufficiency   . Type II diabetes mellitus ([place])   . Ventricular tachycardia [facility])    s/p MDT ICD implant    Past Surgical History:  Procedure Laterality Date  . CARDIAC CATHETERIZATION  03/08/2004   EF 25-30%  . CARDIOVERSION N/A 10/17/2015   Procedure: CARDIOVERSION;  Surgeon: Thayer Headings, MD;  Location: [facility];  Service: Cardiovascular;  Laterality: N/A;  . EP IMPLANTABLE DEVICE N/A 11/16/2015   Procedure: BiVI Upgrade;  Surgeon: Thompson Grayer, MD;  [employer]tronic [address] [place]  . INSERTION OF ICD  06/2008   ICD- Medtronic   . RIGHT HEART CATHETERIZATION N/A 05/23/2014   Procedure: RIGHT HEART CATH;  Surgeon: Jolaine Artist, MD;  Location[facility];  Service: Cardiovascular;  Laterality: N/A;  . TRANSTHORACIC ECHOCARDIOGRAM  05/27/2008   EF 30-35%  . US ECHOCARDIOGRAPHY  08/22/2008   EF 30-35%    Current Outpatient Medications  Medication Sig Dispense Refill  . acetaminophen (TYLENOL) 500 MG tablet Take 500 mg by mouth every 6 (six) hours as needed for moderate pain or headache.     . albuterol (PROAIR  HFA) 108 (90 BASE) MCG/ACT inhaler Inhale 2 puffs into the lungs every 6 (six) hours as needed for wheezing or shortness of breath. Reported on 02/06/2016    . allopurinol (ZYLOPRIM) 300 MG tablet Take 300 mg by mouth daily.    Marland Kitchen atorvastatin (LIPITOR) 80 MG tablet Take 80 mg by mouth daily.    . colchicine 0.6 MG tablet Take 0.6 mg by mouth daily as needed (for gout flares).     Marland Kitchen digoxin (LANOXIN) 0.25 MG tablet TAKE 1/2 TABLET EVERY OTHER DAY 23 tablet 7  . ENTRESTO 49-51 MG TAKE 1 TABLET BY MOUTH 2 (TWO) TIMES DAILY. 180 tablet 3  . Fluticasone-Salmeterol (ADVAIR DISKUS) 250-50 MCG/DOSE AEPB Inhale 1 puff into the lungs every 12 (twelve) hours as needed (for flares/shortness of  breath). Reported on 02/06/2016    . hydrALAZINE (APRESOLINE) 25 MG tablet Take 1.5 tablets (37.5 mg total) by mouth 3 (three) times daily. 405 tablet 3  . insulin detemir (LEVEMIR) 100 UNIT/ML injection Inject 40 Units into the skin at bedtime.     . insulin lispro (HUMALOG) 100 UNIT/ML injection Inject 5 Units into the skin 3 (three) times daily before meals.    . isosorbide mononitrate (IMDUR) 30 MG 24 hr tablet TAKE 1 TABLET (30 MG TOTAL) BY MOUTH 2 TIMES DAILY. 180 tablet 3  . metolazone (ZAROXOLYN) 2.5 MG tablet Take 2.5 mg by mouth daily as needed (for edema). Reported on 02/06/2016    . sotalol (BETAPACE) 80 MG tablet TAKE 1 TABLET (80 MG TOTAL) BY MOUTH 2 (TWO) TIMES DAILY. 180 tablet 3  . spironolactone (ALDACTONE) 25 MG tablet Take 12.5 mg by mouth daily.     Marland Kitchen torsemide (DEMADEX) 20 MG tablet TAKE 2 TABLETS (40 MG TOTAL) BY MOUTH EVERY MORNING AND 1 TABLET (20 MG TOTAL) AT BEDTIME. 270 tablet 1  . XARELTO 20 MG TABS tablet TAKE 1 TABLET (20 MG TOTAL) BY MOUTH DAILY WITH SUPPER. 90 tablet 3   No current facility-administered medications for this visit.    Allergies:   Patient has no known allergies.   Social History:  The patient  reports that he has never smoked. He has never used smokeless tobacco. He reports current alcohol use. He reports that he does not use drugs.   Family History:  The patient's family history includes CAD in an other family member; Cancer in his father; Diabetes in an other family member; Hypertension in his mother.  ROS:  Please see the history of present illness. All other systems are reviewed and otherwise negative.   PHYSICAL EXAM:  VS:  There were no vitals taken for this visit. BMI: There is no height or weight on file to calculate BMI. Well nourished, well developed, in no acute distress  HEENT: normocephalic, atraumatic  Neck: no JVD, obese neck, carotid bruits or masses Cardiac: ***  RRR; no significant murmurs, no rubs, or gallops Lungs: *** CTA  b/l, no wheezing, rhonchi or rales  Abd: soft, nontender, obese MS: no deformity or atrophy Ext:***  no edema  Skin: warm and dry, no rash Neuro:  No gross deficits appreciated Psych: euthymic mood, full affect   *** ICD site is stable, no tethering or discomfort   EKG: done today and reviewed by myself: ***   ICD interrogation done today and reviewed by myself: ***   05/03/12: EF 25-30%.  Grade 1 diastolic dysfunction.  Mild MR.  Mod dilated LA.   07/27/13: EF 40%, diff HK, MR mild, LA  mild/mod dilated  03/2014: EF30-35%, grade II DD, mild MR, RV systolic fx mildly decreased 10/2014: EF 40%.  10/2015: EF ~40%. RV mildly dilated.  8/17 EF 25-35%  Recent Labs: 05/24/2019: Magnesium 2.2 12/14/2019: B Natriuretic Peptide 38.9; BUN 62; Creatinine, Ser 1.73; Hemoglobin 12.5; Platelets 193; Potassium 3.8; Sodium 136  No results found for requested labs within last 8760 hours.   CrCl cannot be calculated (Patient's most recent lab result is older than the maximum 21 days allowed.).   Wt Readings from Last 3 Encounters:  12/14/19 299 lb (135.6 kg)  05/24/19 282 lb 9.6 oz (128.2 kg)  03/10/19 295 lb (133.8 kg)     Other studies reviewed: Additional studies/records reviewed today include: summarized above  ASSESSMENT AND PLAN:  1. CRT-D     *** Intact function, no changes made  2. Chronic CHF (systolic), NICM     *** Weight is up, though no symptoms or exam findings to suggest fluid OL,  OptiVol looks good     Follows with CHF team and L. Short, RN   3. Paroxysmal AFib     CHA2DS2Vasc is at least 3, on xarelto, appropriately dosed (last Cr. Cl = 109)     *** Sotalol with stable QTc   4. HTN     *** Looks good, no changes  5. Obesity     *** Discussed importance of weight loss, he remains motivated, back at home and on his usual regime, walking regularly   Disposition: ***   Current medicines are reviewed at length with the patient today.  The patient did not have any  concerns regarding medicines.  Norma Fredrickson, [name] 01/22/2020 11:27 AM     [facility] [address] [place] [address] [phone] (office)  [phone] (fax)

## 2020-01-24 ENCOUNTER — Encounter: Payer: Medicare Other | Admitting: Physician Assistant

## 2020-01-31 ENCOUNTER — Other Ambulatory Visit (HOSPITAL_COMMUNITY): Payer: Self-pay | Admitting: Internal Medicine

## 2020-01-31 ENCOUNTER — Other Ambulatory Visit: Payer: Self-pay | Admitting: Internal Medicine

## 2020-02-03 ENCOUNTER — Other Ambulatory Visit (HOSPITAL_COMMUNITY): Payer: Self-pay | Admitting: Internal Medicine

## 2020-02-12 NOTE — Progress Notes (Addendum)
Cardiology Office Note Date:  02/12/2020  Patient ID:  Gary Hutchinson, DOB Sep 17, 1961, MRN [id number] PCP:  Creola Corn, MD  Cardiologist:  Dr. Gala Romney Electrophysiologist: Dr. [name]    Chief Complaint:  Annual EP/device visit  History of Present Illness: Gary Hutchinson is a 59 y.o. male with history of NICM, chronic CHF (systolic), ICD, DM, HTN, OSA w/CPAP, VT, AFib, hx of amio thyrotoxicity managed with his PMD on methimazole, morbid obesity.   He comes in today to be seen for Dr. [name], last seen by him via video visit April 2020.  Was feeling well, no changes were made.  He saw Dr. Gala Romney 12/14/2019, had an echo done that day with LVEF 30%, RV was OK.  Planned for Entresto titration at his next visit.  farxiga was added. He is also followed by  [facility] clinic RN.  He is doing well, a little disappointed in him self with the weight gain over the last year.  Though he has had both his vaccines now and is motivated to get outside more and start walking again.   No CP, palpitations or cardiac awareness, no SOB, minimal DOE.  He feels physically like he is in a good place N dizzy spells, near syncope or syncope.  No bleeding or signs of bleeding  Device information MDT ICD implanted 2009, upgrade to CRT-D 11/16/15, Dr. [name]  + inappropriate shock for rapid Aflutter (atypical) in the setting of illness w/flu Dec 2018, inappropriate shock for 1:1 AFlutter  AAD:  amiodarone developed thyrotoxicity Dec 2018 started on Sotalol  Past Medical History:  Diagnosis Date  . AICD (automatic cardioverter/defibrillator) present    Medtronic  . Alcohol abuse    now quit  . Anemia    iron defi  . Arthritis    "fingers, knees, some days all my joints ache" (11/16/2015)  . Asthma   . Cholecystitis   . Chronic systolic congestive heart failure (HCC)    a. RHC (02/2011) RA 31/27, RV 71/27, PA67/45, PCWP 46, PA 49%, Fick CO: 3.4 b. ECHO (03/2014) EF 30-35%, grade II DD, mild MR, RV poorly  visualized appears mildly decreased c. RHC (05/2014) RA 13, RV 54/4/11, PA 60/22 (37), PCWP 17, Fick CO/CI: 4.4 / 1.9, Thermo CO/CI: 3.8 /1.6, PVR 5.2 WU, PA 57% and 59%  . Diverticulosis of colon   . Gout   . Hemorrhoids, internal   . Hyperlipidemia   . Hypertension   . Morbid obesity (HCC)   . Nonischemic cardiomyopathy (HCC)    a. LHC (02/2011) normal coronaries  . OSA on CPAP   . Paroxysmal atrial fibrillation (HCC)   . Pneumonia 2000s X 1  . Pulmonary hypertension (HCC)   . Renal insufficiency   . Type II diabetes mellitus (HCC)   . Ventricular tachycardia [facility])    s/p MDT ICD implant    Past Surgical History:  Procedure Laterality Date  . CARDIAC CATHETERIZATION  03/08/2004   EF 25-30%  . CARDIOVERSION N/A 10/17/2015   Procedure: CARDIOVERSION;  Surgeon: [name], MD;  Location: [facility] ENDOSCOPY;  Service: Cardiovascular;  Laterality: N/A;  . EP IMPLANTABLE DEVICE N/A 11/16/2015   Procedure: BiVI Upgrade;  Surgeon: Hillis Range, MD;  Medtronic [address] [place]  . INSERTION OF ICD  06/2008   ICD- Medtronic   . RIGHT HEART CATHETERIZATION N/A 05/23/2014   Procedure: RIGHT HEART CATH;  Surgeon: [name], MD;  Location: [facility] CATH LAB;  Service: Cardiovascular;  Laterality: N/A;  .  TRANSTHORACIC ECHOCARDIOGRAM  05/27/2008   EF 30-35%  . US ECHOCARDIOGRAPHY  08/22/2008   EF 30-35%    Current Outpatient Medications  Medication Sig Dispense Refill  . acetaminophen (TYLENOL) 500 MG tablet Take 500 mg by mouth every 6 (six) hours as needed for moderate pain or headache.     . albuterol (PROAIR HFA) 108 (90 BASE) MCG/ACT inhaler Inhale 2 puffs into the lungs every 6 (six) hours as needed for wheezing or shortness of breath. Reported on 02/06/2016    . allopurinol (ZYLOPRIM) 300 MG tablet Take 300 mg by mouth daily.    Marland Kitchen atorvastatin (LIPITOR) 80 MG tablet Take 80 mg by mouth daily.    . colchicine 0.6 MG tablet Take 0.6 mg by mouth daily as needed (for gout flares).     Marland Kitchen digoxin  (LANOXIN) 0.25 MG tablet TAKE 1/2 TABLET BY MOUTH EVERY OTHER DAY 45 tablet 3  . ENTRESTO 49-51 MG TAKE 1 TABLET BY MOUTH TWICE A DAY 180 tablet 3  . Fluticasone-Salmeterol (ADVAIR DISKUS) 250-50 MCG/DOSE AEPB Inhale 1 puff into the lungs every 12 (twelve) hours as needed (for flares/shortness of breath). Reported on 02/06/2016    . hydrALAZINE (APRESOLINE) 25 MG tablet Take 1.5 tablets (37.5 mg total) by mouth 3 (three) times daily. 405 tablet 3  . insulin detemir (LEVEMIR) 100 UNIT/ML injection Inject 40 Units into the skin at bedtime.     . insulin lispro (HUMALOG) 100 UNIT/ML injection Inject 5 Units into the skin 3 (three) times daily before meals.    . isosorbide mononitrate (IMDUR) 30 MG 24 hr tablet TAKE 1 TABLET (30 MG TOTAL) BY MOUTH 2 TIMES DAILY. 180 tablet 3  . metolazone (ZAROXOLYN) 2.5 MG tablet Take 2.5 mg by mouth daily as needed (for edema). Reported on 02/06/2016    . sotalol (BETAPACE) 80 MG tablet Take 1 tablet (80 mg total) by mouth 2 (two) times daily. Please keep upcoming appointment for further refills 180 tablet 0  . spironolactone (ALDACTONE) 25 MG tablet Take 12.5 mg by mouth daily.     Marland Kitchen torsemide (DEMADEX) 20 MG tablet TAKE 2 TABLETS (40 MG TOTAL) BY MOUTH EVERY MORNING AND 1 TABLET (20 MG TOTAL) AT BEDTIME. 270 tablet 1  . XARELTO 20 MG TABS tablet TAKE 1 TABLET (20 MG TOTAL) BY MOUTH DAILY WITH SUPPER. 90 tablet 3   No current facility-administered medications for this visit.    Allergies:   Patient has no known allergies.   Social History:  The patient  reports that he has never smoked. He has never used smokeless tobacco. He reports current alcohol use. He reports that he does not use drugs.   Family History:  The patient's family history includes CAD in an other family member; Cancer in his father; Diabetes in an other family member; Hypertension in his mother.  ROS:  Please see the history of present illness. All other systems are reviewed and otherwise  negative.   PHYSICAL EXAM:  VS:  There were no vitals taken for this visit. BMI: There is no height or weight on file to calculate BMI. Well nourished, well developed, in no acute distress  HEENT: normocephalic, atraumatic  Neck: no JVD, obese neck, carotid bruits or masses Cardiac: RRR; no significant murmurs, no rubs, or gallops Lungs: CTA b/l, no wheezing, rhonchi or rales  Abd: soft, nontender, obese MS: no deformity or atrophy Ext:  no edema  Skin: warm and dry, no rash Neuro:  No gross deficits appreciated  Psych: euthymic mood, full affect   ICD site is stable, no tethering or discomfort   EKG: done today and reviewed by myself:  SR/VP, stable QT   ICD interrogation done today and reviewed by myself: Battery and lead measurements are good 5 AF episodes, all on 5/2, burden <0.01% No VT/VF, shocks/therapies 97.1% VP OptiVol is on the rise, has crossed threshold   05/03/12: EF 25-30%.  Grade 1 diastolic dysfunction.  Mild MR.  Mod dilated LA.   07/27/13: EF 40%, diff HK, MR mild, LA mild/mod dilated  03/2014: EF30-35%, grade II DD, mild MR, RV systolic fx mildly decreased 10/2014: EF 40%.  10/2015: EF ~40%. RV mildly dilated.  8/17 EF 25-35%  12/14/2019: TTE IMPRESSIONS  1. Left ventricular ejection fraction, by estimation, is 30 to 35%. The  left ventricle has moderately decreased function. The left ventricle  demonstrates global hypokinesis. The left ventricular internal cavity size  was severely dilated. Left  ventricular diastolic parameters are consistent with Grade II diastolic  dysfunction (pseudonormalization).  2. Right ventricular systolic function is normal. The right ventricular  size is normal. There is normal pulmonary artery systolic pressure.  3. Left atrial size was severely dilated.  4. The mitral valve is normal in structure. Mild to moderate mitral valve  regurgitation, with eccentric laterally directed jet.No evidence of mitral  stenosis.    5. The aortic valve is tricuspid. Aortic valve regurgitation is not  visualized. Mild aortic valve sclerosis is present, with no evidence of  aortic valve stenosis.  6. The inferior vena cava is normal in size with greater than 50%  respiratory variability, suggesting right atrial pressure of 3 mmHg.   Recent Labs: 05/24/2019: Magnesium 2.2 12/14/2019: B Natriuretic Peptide 38.9; BUN 62; Creatinine, Ser 1.73; Hemoglobin 12.5; Platelets 193; Potassium 3.8; Sodium 136  No results found for requested labs within last 8760 hours.   CrCl cannot be calculated (Patient's most recent lab result is older than the maximum 21 days allowed.).   Wt Readings from Last 3 Encounters:  12/14/19 299 lb (135.6 kg)  05/24/19 282 lb 9.6 oz (128.2 kg)  03/10/19 295 lb (133.8 kg)     Other studies reviewed: Additional studies/records reviewed today include: summarized above  ASSESSMENT AND PLAN:  1. CRT-D     Intact function, no changes made  2. Chronic CHF (systolic), NICM     Weight is up, though this he reports has been a slow steady gain over the last year     He has not been weiging at home      OptiVol is on it's wau up, he has been more liberal with his food lately, eating out     Follows with CHF team and L. Short, RN  Take a PRN metolazone today, follow weights daily Discussed importance pf minimizing sodium, avoiding restaurants  He will be due for ICM clinic check next week   3. Paroxysmal AFib     CHA2DS2Vasc is at least 3, on xarelto, appropriately dosed (last Cr. Cl = 93)     Burden <0.01%     Sotalol with stable QTc     Check a mag today   4. HTN     Looks good, no changes  5. Obesity     He is re-motivated, now that he is vaccinated and the weather is better     Discussed importance of not exercising at high heat times of the day outside   Disposition: continue ICM monthly check ups,  remotes otherwise every 3 mo, Dr. Allred/EP in 1 year, sooner if needed.  Continue with  Dr. Haroldine Laws as directed by him/[facility] clinic.   Current medicines are reviewed at length with the patient today.  The patient did not have any concerns regarding medicines.  Venetia Night, [name] 02/12/2020 8:07 AM     [address] [place] [id number] [phone] (office)  [phone] (fax)

## 2020-02-13 ENCOUNTER — Ambulatory Visit (INDEPENDENT_AMBULATORY_CARE_PROVIDER_SITE_OTHER): Payer: Medicare Other | Admitting: Physician Assistant

## 2020-02-13 ENCOUNTER — Other Ambulatory Visit: Payer: Self-pay

## 2020-02-13 VITALS — BP 126/68 | HR 66 | Ht 65.0 in | Wt 314.0 lb

## 2020-02-13 DIAGNOSIS — I480 Paroxysmal atrial fibrillation: Secondary | ICD-10-CM

## 2020-02-13 DIAGNOSIS — Z95810 Presence of automatic (implantable) cardiac defibrillator: Secondary | ICD-10-CM | POA: Diagnosis not present

## 2020-02-13 DIAGNOSIS — I428 Other cardiomyopathies: Secondary | ICD-10-CM | POA: Diagnosis not present

## 2020-02-13 DIAGNOSIS — Z79899 Other long term (current) drug therapy: Secondary | ICD-10-CM | POA: Diagnosis not present

## 2020-02-13 DIAGNOSIS — I5022 Chronic systolic (congestive) heart failure: Secondary | ICD-10-CM

## 2020-02-13 DIAGNOSIS — I10 Essential (primary) hypertension: Secondary | ICD-10-CM

## 2020-02-13 LAB — MAGNESIUM: Magnesium: 2.4 mg/dL — ABNORMAL HIGH (ref 1.6–2.3)

## 2020-02-13 NOTE — Patient Instructions (Addendum)
Medication Instructions:   TODAY: TAKE A DOSE OF METOLAZONE 2.5 MG   *If you need a refill on your cardiac medications before your next appointment, please call your pharmacy*   Lab Work: MAGNESIUM TODAY    If you have labs (blood work) drawn today and your tests are completely normal, you will receive your results only by: Marland Kitchen MyChart Message (if you have MyChart) OR . A paper copy in the mail If you have any lab test that is abnormal or we need to change your treatment, we will call you to review the results.   Testing/Procedures: NONE ORDERED  TODAY   Follow-Up: At [facility], you and your health needs are our priority.  As part of our continuing mission to provide you with exceptional heart care, we have created designated Provider Care Teams.  These Care Teams include your primary Cardiologist (physician) and Advanced Practice Providers (APPs -  Physician Assistants and Nurse Practitioners) who all work together to provide you with the care you need, when you need it.  We recommend signing up for the patient portal called "MyChart".  Sign up information is provided on this After Visit Summary.  MyChart is used to connect with patients for Virtual Visits (Telemedicine).  Patients are able to view lab/test results, encounter notes, upcoming appointments, etc.  Non-urgent messages can be sent to your provider as well.   To learn more about what you can do with MyChart, go to [email or web address].    Your next appointment:   1 year(s)  The format for your next appointment:   In Person  Provider:   You may see Hillis Range, MD or one of the following Advanced Practice Providers on your designated Care Team:    Gypsy Balsam, NP  Francis Dowse, [name]  [name] "Otilio Saber, [place]    Other Instructions

## 2020-02-20 ENCOUNTER — Ambulatory Visit (INDEPENDENT_AMBULATORY_CARE_PROVIDER_SITE_OTHER): Payer: Medicare Other

## 2020-02-20 DIAGNOSIS — I5022 Chronic systolic (congestive) heart failure: Secondary | ICD-10-CM

## 2020-02-20 DIAGNOSIS — Z95810 Presence of automatic (implantable) cardiac defibrillator: Secondary | ICD-10-CM

## 2020-02-21 NOTE — Progress Notes (Signed)
EPIC Encounter for ICM Monitoring  Patient Name: Gary Hutchinson is a 59 y.o. male Date: 02/21/2020 Primary Care Physican: Creola Corn, MD Primary Cardiologist:[name] Electrophysiologist: Allred Bi-V Pacing: 97.7% 02/21/2020 Weight: 311lbs  Time in AT/AF  <0.1 hr/day (<0.1%   Spoke with patient and reports feeling well at this time.  Denies fluid symptoms.  He took a Metolazone after the 5/10 office visit.    Optivol thoracic impedancenormal.    Prescribed dosage:  Torsemide 20 mg 2 tablets (40 mg total)every morning and 1 tablet(20 mg total)every evening.  Metolazone 2.5 mg 1 tablet as needed.  Labs: 12/14/2019 Creatinine 1.73, BUN 62, Potassium 3.8, Sodium 136, GFR 43-49 05/24/2019 Creatinine 1.83, BUN 58, Potassium 3.5, Sodium 135, GFR 40-46 01/06/2020Creatinine 1.54, BUN40, Potassium 4.0, Sodium 133, GFR 49-57  Recommendations:Reiterated limiting salt and fluid intake. He has been eating out more.   Follow-up plan: ICM clinic phone appointment on6/21/2021. 91 day device clinic remote transmission7/06/2020.    Copy of ICM check sent to Dr.Allred.  3 month ICM trend: 02/21/2020    1 Year ICM trend:       Karie Soda, RN 02/21/2020 3:48 PM

## 2020-03-09 ENCOUNTER — Ambulatory Visit: Payer: Medicare Other | Admitting: Internal Medicine

## 2020-03-26 ENCOUNTER — Ambulatory Visit (INDEPENDENT_AMBULATORY_CARE_PROVIDER_SITE_OTHER): Payer: Medicare Other

## 2020-03-26 DIAGNOSIS — I5022 Chronic systolic (congestive) heart failure: Secondary | ICD-10-CM | POA: Diagnosis not present

## 2020-03-26 DIAGNOSIS — Z95810 Presence of automatic (implantable) cardiac defibrillator: Secondary | ICD-10-CM | POA: Diagnosis not present

## 2020-03-28 ENCOUNTER — Telehealth: Payer: Self-pay

## 2020-03-28 NOTE — Progress Notes (Signed)
EPIC Encounter for ICM Monitoring  Patient Name: Gary Hutchinson is a 59 y.o. male Date: 03/28/2020 Primary Care Physican: Creola Corn, MD Primary Cardiologist:[name] Electrophysiologist: Allred Bi-V Pacing: 97.6% 02/21/2020 Weight: 311lbs  Time in AT/AF  <0.1 hr/day (<0.1%)  Spoke with patient and reports feeling bloated at this time.     Optivol thoracic impedancesuggesting possible fluid accumulation since 03/19/2020.   Prescribed dosage:  Torsemide 20 mg 2 tablets (40 mg total)every morning and 1 tablet(20 mg total)every evening.  Metolazone 2.5 mg 1 tablet as needed.  Labs: 12/14/2019 Creatinine 1.73, BUN 62, Potassium 3.8, Sodium 136, GFR 43-49 05/24/2019 Creatinine 1.83, BUN 58, Potassium 3.5, Sodium 135, GFR 40-46 01/06/2020Creatinine 1.54, BUN40, Potassium 4.0, Sodium 133, GFR 49-57  Recommendations:Unable to reach.  Pt takes Metolazone as needed for fluid accumulation  Follow-up plan: ICM clinic phone appointment on6/28/2021 to recheck fluid levels. 91 day device clinic remote transmission7/06/2020.   Copy of ICM check sent to Dr.Allred and Dr Gala Romney.  3 month ICM trend: 03/26/2020    1 Year ICM trend:       Karie Soda, RN 03/28/2020 2:16 PM

## 2020-03-28 NOTE — Telephone Encounter (Signed)
Remote ICM transmission received.  Attempted call to patient regarding ICM remote transmission and left message to return call   

## 2020-03-28 NOTE — Progress Notes (Signed)
Patient returned call.  He reports he has not been following a low salt diet and drinking too much fluids.  Recommendation to limit salt intake to 2000 mg daily and fluid intake to 64 oz daily.  Encouraged to call if experiencing any fluid symptoms.  He took a Metolazone last week.  Advised to adjust diet and fluid intake and wait a couple of days before taking another Metolazone.  He does have some abdominal bloating and will take Metolazone later this week if that does not improve.

## 2020-04-02 ENCOUNTER — Ambulatory Visit (INDEPENDENT_AMBULATORY_CARE_PROVIDER_SITE_OTHER): Payer: Medicare Other

## 2020-04-02 DIAGNOSIS — Z95810 Presence of automatic (implantable) cardiac defibrillator: Secondary | ICD-10-CM

## 2020-04-02 DIAGNOSIS — I5022 Chronic systolic (congestive) heart failure: Secondary | ICD-10-CM

## 2020-04-03 NOTE — Progress Notes (Signed)
EPIC Encounter for ICM Monitoring  Patient Name: Gary Hutchinson is a 59 y.o. male Date: 04/03/2020 Primary Care Physican: Creola Corn, MD Primary Cardiologist:[name] Electrophysiologist: Allred Bi-V Pacing: 97.8% 02/21/2020 Weight:311lbs  Time in AT/AF <0.1 hr/day (<0.1%)  Spoke with patient and he reports he is doing well at this time.    Optivol thoracic impedancereturned to normal   Prescribed dosage:  Torsemide 20 mg 2 tablets (40 mg total)every morning and 1 tablet(20 mg total)every evening.  Metolazone 2.5 mg 1 tablet as needed.  Labs: 12/14/2019 Creatinine 1.73, BUN 62, Potassium 3.8, Sodium 136, GFR 43-49 05/24/2019 Creatinine 1.83, BUN 58, Potassium 3.5, Sodium 135, GFR 40-46 01/06/2020Creatinine 1.54, BUN40, Potassium 4.0, Sodium 133, GFR 49-57  Recommendations:No changes and encouraged to call if experiencing any fluid symptoms.  Follow-up plan: ICM clinic phone appointment on 04/30/2020 to recheck fluid levels. 91 day device clinic remote transmission7/06/2020.   Copy of ICM check sent to Dr.Allred.  3 month ICM trend: 04/02/2020    1 Year ICM trend:       Karie Soda, RN 04/03/2020 5:16 PM

## 2020-04-10 ENCOUNTER — Other Ambulatory Visit: Payer: Self-pay

## 2020-04-10 ENCOUNTER — Encounter: Payer: Self-pay | Admitting: Internal Medicine

## 2020-04-10 ENCOUNTER — Ambulatory Visit (INDEPENDENT_AMBULATORY_CARE_PROVIDER_SITE_OTHER): Payer: Medicare Other | Admitting: Internal Medicine

## 2020-04-10 VITALS — BP 120/70 | HR 68 | Temp 97.2°F | Ht 65.0 in | Wt 318.4 lb

## 2020-04-10 DIAGNOSIS — G4733 Obstructive sleep apnea (adult) (pediatric): Secondary | ICD-10-CM | POA: Diagnosis not present

## 2020-04-10 NOTE — Progress Notes (Signed)
03/10/2019- 58 yoM never smoker for sleep evaluation. Coming to re-establish after LOV 2017.Had seen Dr. Shelle Iron for OSA in the past; OSA on BIPAP, DME: [employer], no issues w/ machine, mask, or pressure settings at this time; has questions about cleaning it;  Medical problem list includes AFib, VTach/ AICD HBP, Chronic Systolic CHF, Amiodarone induce hypothyroid, DM2, Morbid obesity, Gout, Diverticulosis NPSG 04/06/01 AHI 117/ hr, desaturation to 60%, body weight 324 lbs BIPAP IPAP 17/ EPAP 13/ [employer] Download 80% compliance, AHI 2.1/ hr Body weight today 295 lbs Epworth score 3 Sleeps ok w BIPAP. Occ DIMS- not concerned. Asks about SoClean devices. No ENT surgery. Asthma only with colds.   04/10/20- 59 yoM never smoker followed for OSA, complicated by  AFib, VTach/ AICD, HBP, Chronic Systolic CHF, Amiodarone induce hypothyroid, DM2, Morbid obesity, Gout, Diverticulosis BIPAP 17/13/  [employer] Download compliance 90%, AHI 1.1/ hr Body weight today 318 lbs.   Blames weight gain on Covid restrictions  Up 73 lbs in past year. Would like to try a little higher pressure.  We reviewed download.  Covid va- 2 Moderna  ROS-see HPI   + = positive Constitutional:    weight loss, night sweats, fevers, chills, fatigue, lassitude. HEENT:    headaches, difficulty swallowing, tooth/dental problems, sore throat,       sneezing, itching, ear ache, nasal congestion, post nasal drip, snoring CV:    chest pain, orthopnea, PND, swelling in lower extremities, anasarca,                                   dizziness, palpitations Resp:   shortness of breath with exertion or at rest.                productive cough,   non-productive cough, coughing up of blood.              change in color of mucus.  wheezing.   Skin:    rash or lesions. GI:  No-   heartburn, indigestion, abdominal pain, nausea, vomiting, diarrhea,                 change in bowel habits, loss of appetite GU: dysuria, change in color of urine, no urgency or  frequency.   flank pain. MS:   joint pain, stiffness, decreased range of motion, back pain. Neuro-     nothing unusual Psych:  change in mood or affect.  depression or anxiety.   memory loss.  OBJ- Physical Exam General- Alert, Oriented, Affect-appropriate, Distress- none acute, + morbidly obese Skin- rash-none, lesions- none, excoriation- none Lymphadenopathy- none Head- atraumatic            Eyes- Gross vision intact, PERRLA, conjunctivae and secretions clear            Ears- Hearing, canals-normal            Nose- Clear, no-Septal dev, mucus, polyps, erosion, perforation             Throat-+ upper plate, Mallampati III , mucosa clear , drainage- none, tonsils- atrophic Neck- flexible , trachea midline, no stridor , thyroid nl, carotid no bruit Chest - symmetrical excursion , unlabored           Heart/CV- RRR , no murmur , no gallop  , no rub, nl s1 s2                           -  JVD- none , edema- none, stasis changes- none, varices- none           Lung- clear to P&A, wheeze- none, cough- none , dullness-none, rub- none           Chest wall- + AICD Abd-  Br/ Gen/ Rectal- Not done, not indicated Extrem- cyanosis- none, clubbing, none, atrophy- none, strength- nl Neuro- grossly intact to observation

## 2020-04-10 NOTE — Patient Instructions (Signed)
Order- DME [employer]- please increase BIPAP range to 20/13, continue mask of choice, humidifier, supplies, Airview/ card     Please replace old machine if eligible.  Please call if we can help

## 2020-04-13 ENCOUNTER — Ambulatory Visit (INDEPENDENT_AMBULATORY_CARE_PROVIDER_SITE_OTHER): Payer: Medicare Other | Admitting: *Deleted

## 2020-04-13 DIAGNOSIS — I428 Other cardiomyopathies: Secondary | ICD-10-CM

## 2020-04-13 LAB — CUP PACEART REMOTE DEVICE CHECK
Battery Remaining Longevity: 35 mo
Battery Voltage: 2.97 V
Brady Statistic AP VP Percent: 1.19 %
Brady Statistic AP VS Percent: 0.03 %
Brady Statistic AS VP Percent: 96.91 %
Brady Statistic AS VS Percent: 1.86 %
Brady Statistic RA Percent Paced: 1.22 %
Brady Statistic RV Percent Paced: 2.84 %
Date Time Interrogation Session: 20210709063326
HighPow Impedance: 84 Ohm
Implantable Lead Implant Date: 20090901
Implantable Lead Implant Date: 20170210
Implantable Lead Implant Date: 20170210
Implantable Lead Location: 753858
Implantable Lead Location: 753859
Implantable Lead Location: 753860
Implantable Lead Model: 4598
Implantable Lead Model: 5076
Implantable Lead Model: 6935
Implantable Pulse Generator Implant Date: 20170210
Lead Channel Impedance Value: 304 Ohm
Lead Channel Impedance Value: 361 Ohm
Lead Channel Impedance Value: 418 Ohm
Lead Channel Impedance Value: 418 Ohm
Lead Channel Impedance Value: 456 Ohm
Lead Channel Impedance Value: 475 Ohm
Lead Channel Impedance Value: 532 Ohm
Lead Channel Impedance Value: 608 Ohm
Lead Channel Impedance Value: 722 Ohm
Lead Channel Impedance Value: 779 Ohm
Lead Channel Impedance Value: 950 Ohm
Lead Channel Impedance Value: 950 Ohm
Lead Channel Impedance Value: 988 Ohm
Lead Channel Pacing Threshold Amplitude: 0.75 V
Lead Channel Pacing Threshold Amplitude: 0.875 V
Lead Channel Pacing Threshold Amplitude: 1.125 V
Lead Channel Pacing Threshold Pulse Width: 0.4 ms
Lead Channel Pacing Threshold Pulse Width: 0.4 ms
Lead Channel Pacing Threshold Pulse Width: 0.4 ms
Lead Channel Sensing Intrinsic Amplitude: 11 mV
Lead Channel Sensing Intrinsic Amplitude: 11 mV
Lead Channel Sensing Intrinsic Amplitude: 2.25 mV
Lead Channel Sensing Intrinsic Amplitude: 2.25 mV
Lead Channel Setting Pacing Amplitude: 1.5 V
Lead Channel Setting Pacing Amplitude: 2 V
Lead Channel Setting Pacing Amplitude: 2.25 V
Lead Channel Setting Pacing Pulse Width: 0.4 ms
Lead Channel Setting Pacing Pulse Width: 0.4 ms
Lead Channel Setting Sensing Sensitivity: 0.3 mV

## 2020-04-17 NOTE — Progress Notes (Signed)
Remote ICD transmission.   

## 2020-04-29 ENCOUNTER — Other Ambulatory Visit: Payer: Self-pay | Admitting: Internal Medicine

## 2020-04-30 ENCOUNTER — Ambulatory Visit (INDEPENDENT_AMBULATORY_CARE_PROVIDER_SITE_OTHER): Payer: Medicare Other

## 2020-04-30 DIAGNOSIS — Z95810 Presence of automatic (implantable) cardiac defibrillator: Secondary | ICD-10-CM

## 2020-04-30 DIAGNOSIS — I5022 Chronic systolic (congestive) heart failure: Secondary | ICD-10-CM | POA: Diagnosis not present

## 2020-05-02 ENCOUNTER — Telehealth: Payer: Self-pay

## 2020-05-02 NOTE — Progress Notes (Signed)
EPIC Encounter for ICM Monitoring  Patient Name: Gary Hutchinson is a 59 y.o. male Date: 05/02/2020 Primary Care Physican: Creola Corn, MD Primary Cardiologist:[name] Electrophysiologist: Allred Bi-V Pacing: 97.5% 02/21/2020 Weight:311lbs  Time in AT/AF <0.1 hr/day (<0.1%) (taking Xarelto)  Attempted call to patient and unable to reach.  Transmission reviewed.   Optivol thoracic impedance normal.   Prescribed dosage:  Torsemide 20 mg 2 tablets (40 mg total)every morning and 1 tablet(20 mg total)every evening.  Metolazone 2.5 mg 1 tablet as needed.  Labs: 12/14/2019 Creatinine 1.73, BUN 62, Potassium 3.8, Sodium 136, GFR 43-49 05/24/2019 Creatinine 1.83, BUN 58, Potassium 3.5, Sodium 135, GFR 40-46 01/06/2020Creatinine 1.54, BUN40, Potassium 4.0, Sodium 133, GFR 49-57  Recommendations: Unable to reach.    Follow-up plan: ICM clinic phone appointment on 06/04/2020.   91 day device clinic remote transmission 07/13/2020.    EP/Cardiology Office Visits: Recall for 06/11/2020 with Dr. Gala Romney.  Last EP visit was 02/13/2020 and no recall for year appointment  Copy of ICM check sent to Dr. [name].   3 month ICM trend: 04/30/2020    1 Year ICM trend:       Karie Soda, RN 05/02/2020 9:10 AM

## 2020-05-02 NOTE — Progress Notes (Signed)
Patient returned call and he is doing well.  Transmission reviewed.  He denies any fluid symptoms. No changes and encouraged to call if experiencing any fluid symptoms.

## 2020-05-02 NOTE — Telephone Encounter (Signed)
Remote ICM transmission received.  Attempted call to patient regarding ICM remote transmission and no answer.  

## 2020-05-07 ENCOUNTER — Other Ambulatory Visit (HOSPITAL_COMMUNITY): Payer: Self-pay | Admitting: Internal Medicine

## 2020-05-22 ENCOUNTER — Telehealth (HOSPITAL_COMMUNITY): Payer: Self-pay

## 2020-05-22 NOTE — Telephone Encounter (Signed)
Surgical clearance form sent from [facility], pt needs clearance for extraction of 5 teeth with local anesthesia and nitrous oxide, and hold Xeralto.   Per Dr Gala Romney: "okay to proceed, hold Xeralto for 2 days prior to procedure"   Form faxed back at: [phone]

## 2020-06-04 ENCOUNTER — Ambulatory Visit (INDEPENDENT_AMBULATORY_CARE_PROVIDER_SITE_OTHER): Payer: Medicare Other

## 2020-06-04 DIAGNOSIS — Z95810 Presence of automatic (implantable) cardiac defibrillator: Secondary | ICD-10-CM | POA: Diagnosis not present

## 2020-06-04 DIAGNOSIS — I5022 Chronic systolic (congestive) heart failure: Secondary | ICD-10-CM

## 2020-06-06 NOTE — Progress Notes (Signed)
EPIC Encounter for ICM Monitoring  Patient Name: Gary Hutchinson is a 59 y.o. male Date: 06/06/2020 Primary Care Physican: Creola Corn, MD Primary Cardiologist:[name] Electrophysiologist: Allred Bi-V Pacing: 97.3% 06/06/2020 Weight:312lbs  Time in AT/AF 0.0 hr/day (0.0%) (taking Xarelto)  Spoke with patient and reports feeling well at this time.  Denies fluid symptoms.    Optivol thoracic impedance suggesting normal fluid levels.  Prescribed dosage:  Torsemide 20 mg 2 tablets (40 mg total)every morning and 1 tablet(20 mg total)every evening.  Metolazone 2.5 mg 1 tablet as needed.  Labs: 12/14/2019 Creatinine 1.73, BUN 62, Potassium 3.8, Sodium 136, GFR 43-49 05/24/2019 Creatinine 1.83, BUN 58, Potassium 3.5, Sodium 135, GFR 40-46 01/06/2020Creatinine 1.54, BUN40, Potassium 4.0, Sodium 133, GFR 49-57  Recommendations: No changes and encouraged to call if experiencing any fluid symptoms.  Follow-up plan: ICM clinic phone appointment on 07/09/2020.   91 day device clinic remote transmission 07/13/2020.    EP/Cardiology Office Visits: Recall for 06/11/2020 with Dr. Gala Romney.  Last EP visit was 02/13/2020 and no recall for year appointment  Copy of ICM check sent to Dr. [name].    3 month ICM trend: 06/04/2020    1 Year ICM trend:       Karie Soda, RN 06/06/2020 8:39 AM

## 2020-06-27 NOTE — Assessment & Plan Note (Signed)
Encouraged him to get his weight back under control. Consider Bariatric referral

## 2020-06-27 NOTE — Assessment & Plan Note (Signed)
Benefits from BIPAP with good compliance and control. He asks to raise pressure a little for trial. Plan- increase to 20/ 13

## 2020-07-09 ENCOUNTER — Ambulatory Visit (INDEPENDENT_AMBULATORY_CARE_PROVIDER_SITE_OTHER): Payer: Medicare Other

## 2020-07-09 DIAGNOSIS — I5022 Chronic systolic (congestive) heart failure: Secondary | ICD-10-CM

## 2020-07-09 DIAGNOSIS — Z95810 Presence of automatic (implantable) cardiac defibrillator: Secondary | ICD-10-CM

## 2020-07-11 NOTE — Progress Notes (Signed)
EPIC Encounter for ICM Monitoring  Patient Name: Gary Hutchinson is a 59 y.o. male Date: 07/11/2020 Primary Care Physican: Creola Corn, MD Primary Cardiologist:[name] Electrophysiologist: Allred Bi-V Pacing: 97.2% 07/11/2020 Weight:312lbs  Time in AT/AF 0.0 hr/day (0.0%)(taking Xarelto)  Spoke with patient and reports feeling well at this time.  Denies fluid symptoms.    Optivol thoracic impedance suggesting normal fluid levels.  Prescribed dosage:  Torsemide 20 mg 2 tablets (40 mg total)every morning and 1 tablet(20 mg total)every evening.  Metolazone 2.5 mg 1 tablet as needed.  Labs: 12/14/2019 Creatinine 1.73, BUN 62, Potassium 3.8, Sodium 136, GFR 43-49 05/24/2019 Creatinine 1.83, BUN 58, Potassium 3.5, Sodium 135, GFR 40-46 01/06/2020Creatinine 1.54, BUN40, Potassium 4.0, Sodium 133, GFR 49-57  Recommendations:No changes and encouraged to call if experiencing any fluid symptoms.  Follow-up plan: ICM clinic phone appointment on11/05/2020. 91 day device clinic remote transmission 07/13/2020.   EP/Cardiology Office Visits:Recall for 06/11/2020 with Dr.[name].Last EP visit was 02/13/2020 and no recall for year appointment  Copy of ICM check sent to Dr.Allred.     3 month ICM trend: 07/09/2020    1 Year ICM trend:       Karie Soda, RN 07/11/2020 3:16 PM

## 2020-07-13 ENCOUNTER — Ambulatory Visit (INDEPENDENT_AMBULATORY_CARE_PROVIDER_SITE_OTHER): Payer: Medicare Other

## 2020-07-13 DIAGNOSIS — I428 Other cardiomyopathies: Secondary | ICD-10-CM

## 2020-07-13 LAB — CUP PACEART REMOTE DEVICE CHECK
Battery Remaining Longevity: 38 mo
Battery Voltage: 2.95 V
Brady Statistic AP VP Percent: 9.57 %
Brady Statistic AP VS Percent: 0.22 %
Brady Statistic AS VP Percent: 88.47 %
Brady Statistic AS VS Percent: 1.74 %
Brady Statistic RA Percent Paced: 9.78 %
Brady Statistic RV Percent Paced: 0.37 %
Date Time Interrogation Session: 20211008031704
HighPow Impedance: 91 Ohm
Implantable Lead Implant Date: 20090901
Implantable Lead Implant Date: 20170210
Implantable Lead Implant Date: 20170210
Implantable Lead Location: 753858
Implantable Lead Location: 753859
Implantable Lead Location: 753860
Implantable Lead Model: 4598
Implantable Lead Model: 5076
Implantable Lead Model: 6935
Implantable Pulse Generator Implant Date: 20170210
Lead Channel Impedance Value: 342 Ohm
Lead Channel Impedance Value: 361 Ohm
Lead Channel Impedance Value: 418 Ohm
Lead Channel Impedance Value: 418 Ohm
Lead Channel Impedance Value: 456 Ohm
Lead Channel Impedance Value: 475 Ohm
Lead Channel Impedance Value: 532 Ohm
Lead Channel Impedance Value: 589 Ohm
Lead Channel Impedance Value: 722 Ohm
Lead Channel Impedance Value: 779 Ohm
Lead Channel Impedance Value: 893 Ohm
Lead Channel Impedance Value: 931 Ohm
Lead Channel Impedance Value: 950 Ohm
Lead Channel Pacing Threshold Amplitude: 0.875 V
Lead Channel Pacing Threshold Amplitude: 0.875 V
Lead Channel Pacing Threshold Amplitude: 1.375 V
Lead Channel Pacing Threshold Pulse Width: 0.4 ms
Lead Channel Pacing Threshold Pulse Width: 0.4 ms
Lead Channel Pacing Threshold Pulse Width: 0.4 ms
Lead Channel Sensing Intrinsic Amplitude: 10.625 mV
Lead Channel Sensing Intrinsic Amplitude: 10.625 mV
Lead Channel Sensing Intrinsic Amplitude: 2.25 mV
Lead Channel Sensing Intrinsic Amplitude: 2.25 mV
Lead Channel Setting Pacing Amplitude: 1.75 V
Lead Channel Setting Pacing Amplitude: 2 V
Lead Channel Setting Pacing Amplitude: 2.5 V
Lead Channel Setting Pacing Pulse Width: 0.4 ms
Lead Channel Setting Pacing Pulse Width: 0.4 ms
Lead Channel Setting Sensing Sensitivity: 0.3 mV

## 2020-07-17 NOTE — Progress Notes (Signed)
Remote ICD transmission.   

## 2020-08-12 ENCOUNTER — Encounter (HOSPITAL_BASED_OUTPATIENT_CLINIC_OR_DEPARTMENT_OTHER): Payer: Self-pay | Admitting: *Deleted

## 2020-08-12 ENCOUNTER — Other Ambulatory Visit: Payer: Self-pay

## 2020-08-12 ENCOUNTER — Emergency Department (HOSPITAL_BASED_OUTPATIENT_CLINIC_OR_DEPARTMENT_OTHER)
Admission: EM | Admit: 2020-08-12 | Discharge: 2020-08-12 | Disposition: A | Discharge status: Home / Self Care | Payer: Medicare Other | Attending: Emergency Medicine | Admitting: Emergency Medicine

## 2020-08-12 DIAGNOSIS — E119 Type 2 diabetes mellitus without complications: Secondary | ICD-10-CM | POA: Insufficient documentation

## 2020-08-12 DIAGNOSIS — Z95810 Presence of automatic (implantable) cardiac defibrillator: Secondary | ICD-10-CM | POA: Diagnosis not present

## 2020-08-12 DIAGNOSIS — I480 Paroxysmal atrial fibrillation: Secondary | ICD-10-CM | POA: Diagnosis not present

## 2020-08-12 DIAGNOSIS — Z79899 Other long term (current) drug therapy: Secondary | ICD-10-CM | POA: Diagnosis not present

## 2020-08-12 DIAGNOSIS — Z794 Long term (current) use of insulin: Secondary | ICD-10-CM | POA: Diagnosis not present

## 2020-08-12 DIAGNOSIS — M79651 Pain in right thigh: Secondary | ICD-10-CM

## 2020-08-12 DIAGNOSIS — Z7983 Long term (current) use of bisphosphonates: Secondary | ICD-10-CM | POA: Insufficient documentation

## 2020-08-12 DIAGNOSIS — I5022 Chronic systolic (congestive) heart failure: Secondary | ICD-10-CM | POA: Insufficient documentation

## 2020-08-12 DIAGNOSIS — M79604 Pain in right leg: Secondary | ICD-10-CM | POA: Diagnosis present

## 2020-08-12 DIAGNOSIS — Z79811 Long term (current) use of aromatase inhibitors: Secondary | ICD-10-CM | POA: Insufficient documentation

## 2020-08-12 DIAGNOSIS — J45909 Unspecified asthma, uncomplicated: Secondary | ICD-10-CM | POA: Diagnosis not present

## 2020-08-12 DIAGNOSIS — Z7901 Long term (current) use of anticoagulants: Secondary | ICD-10-CM | POA: Insufficient documentation

## 2020-08-12 DIAGNOSIS — I110 Hypertensive heart disease with heart failure: Secondary | ICD-10-CM | POA: Diagnosis not present

## 2020-08-12 DIAGNOSIS — I272 Pulmonary hypertension, unspecified: Secondary | ICD-10-CM | POA: Diagnosis not present

## 2020-08-12 NOTE — Discharge Instructions (Addendum)
IMPORTANT PATIENT INSTRUCTIONS:  You have been scheduled for an Outpatient Ultrasound.    Your appointment has been scheduled for:  _______ am/pm on _______________ (date).  If your appointment is scheduled for a Saturday, Sunday or holiday, please go to the [facility] Registration Desk at least 15 minutes prior to your appointment time and tell them you are there for an ultrasound.    If your appointment is scheduled for a weekday (Monday - Friday), please go directly to the [facility] reception area at least 15 minutes prior to your appointment time and tell them you are there for an ultrasound.  Please call [phone] with questions. 

## 2020-08-12 NOTE — ED Triage Notes (Signed)
Pt reports right thigh pain x 1 week. States he thinks there may be a "lump" also. Voices concern for blood clot. Reports road trip to [place] this week

## 2020-08-12 NOTE — ED Notes (Signed)
Pt is aware that he needs to return to radiology for an outpt u/s tomorrow at 1215pm.

## 2020-08-12 NOTE — ED Provider Notes (Signed)
[facility] EMERGENCY DEPARTMENT Provider Note   CSN: [id number] Arrival date & time: 08/12/20  2135     History Chief Complaint  Patient presents with  . Leg Pain    Gary Hutchinson is a 59 y.o. male.  HPI 59 year old male presents with about 1 week of right lateral thigh pain.  He feels like there is a knot to his right lower thigh.  No trauma.  He is ambulatory and has no decreased range of motion.  He wants to make sure is not a blood clot.  No weakness or numbness.  Right now the pain is not too bad and it feels more like a dull ache like a toothache. Does not feel like his leg is swollen.   Past Medical History:  Diagnosis Date  . AICD (automatic cardioverter/defibrillator) present    Medtronic  . Alcohol abuse    now quit  . Anemia    iron defi  . Arthritis    "fingers, knees, some days all my joints ache" (11/16/2015)  . Asthma   . Cholecystitis   . Chronic systolic congestive heart failure (HCC)    a. RHC (02/2011) RA 31/27, RV 71/27, PA67/45, PCWP 46, PA 49%, Fick CO: 3.4 b. ECHO (03/2014) EF 30-35%, grade II DD, mild MR, RV poorly visualized appears mildly decreased c. RHC (05/2014) RA 13, RV 54/4/11, PA 60/22 (37), PCWP 17, Fick CO/CI: 4.4 / 1.9, Thermo CO/CI: 3.8 /1.6, PVR 5.2 WU, PA 57% and 59%  . Diverticulosis of colon   . Gout   . Hemorrhoids, internal   . Hyperlipidemia   . Hypertension   . Morbid obesity (HCC)   . Nonischemic cardiomyopathy (HCC)    a. LHC (02/2011) normal coronaries  . OSA on CPAP   . Paroxysmal atrial fibrillation (HCC)   . Pneumonia 2000s X 1  . Pulmonary hypertension (HCC)   . Renal insufficiency   . Type II diabetes mellitus (HCC)   . Ventricular tachycardia [facility])    s/p MDT ICD implant    Patient Active Problem List   Diagnosis Date Noted  . Sepsis (HCC) 11/25/2016  . 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 CHF (congestive heart failure) (HCC) 11/16/2015  . Chronic systolic heart failure (HCC) 10/25/2015    . Atrial flutter (HCC)   . LBBB (left bundle branch block) 10/06/2015  . Cardiogenic shock (HCC) 03/08/2014  . Atrial fibrillation (HCC) 03/06/2014  . Ventricular tachycardia (HCC)   . Automatic implantable cardioverter-defibrillator in situ 12/06/2009  . Pulmonary HTN (HCC) 05/02/2008  . HEMORRHOIDS, INTERNAL 05/02/2008  . DIVERTICULOSIS OF COLON 05/02/2008  . ALCOHOL ABUSE, HX OF 05/02/2008  . CHOLECYSTITIS, UNSPECIFIED 04/10/2008  . Insulin dependent type 2 diabetes mellitus, uncontrolled (HCC) 05/07/2007  . GOUT 05/07/2007  . Morbid obesity (HCC) 05/07/2007  . ANEMIA-IRON DEFICIENCY 05/07/2007  . Obstructive sleep apnea 05/07/2007  . HTN (hypertension) 05/07/2007    Past Surgical History:  Procedure Laterality Date  . CARDIAC CATHETERIZATION  03/08/2004   EF 25-30%  . CARDIOVERSION N/A 10/17/2015   Procedure: CARDIOVERSION;  Surgeon: [name], MD;  Location: [facility] ENDOSCOPY;  Service: Cardiovascular;  Laterality: N/A;  . EP IMPLANTABLE DEVICE N/A 11/16/2015   Procedure: BiVI Upgrade;  Surgeon: Hillis Range, MD;  Medtronic [address] [place]  . INSERTION OF ICD  06/2008   ICD- Medtronic   . RIGHT HEART CATHETERIZATION N/A 05/23/2014   Procedure: RIGHT HEART CATH;  Surgeon: [name], MD;  Location[facility] CATH LAB;  Service:  Cardiovascular;  Laterality: N/A;  . TRANSTHORACIC ECHOCARDIOGRAM  05/27/2008   EF 30-35%  . US ECHOCARDIOGRAPHY  08/22/2008   EF 30-35%       Family History  Problem Relation Age of Onset  . Cancer Father   . Diabetes Other        grandparents  . Hypertension Mother   . CAD Other        No family history    Social History   Tobacco Use  . Smoking status: Never Smoker  . Smokeless tobacco: Never Used  Vaping Use  . Vaping Use: Never used  Substance Use Topics  . Alcohol use: Not Currently    Comment: former heavy ETOH, quit in "the late '90s"  . Drug use: No    Home Medications Prior to Admission medications   Medication Sig Start  Date End Date Taking? Authorizing Provider  acetaminophen (TYLENOL) 500 MG tablet Take 500 mg by mouth every 6 (six) hours as needed for moderate pain or headache.     [provider]  albuterol (PROAIR HFA) 108 (90 BASE) MCG/ACT inhaler Inhale 2 puffs into the lungs every 6 (six) hours as needed for wheezing or shortness of breath. Reported on 02/06/2016    [provider]  allopurinol (ZYLOPRIM) 300 MG tablet Take 300 mg by mouth daily.    [provider]  atorvastatin (LIPITOR) 80 MG tablet Take 80 mg by mouth daily.    [provider]  colchicine 0.6 MG tablet Take 0.6 mg by mouth daily as needed (for gout flares).     [provider]  digoxin (LANOXIN) 0.25 MG tablet TAKE 1/2 TABLET BY MOUTH EVERY OTHER DAY 02/03/20   [name], Bevelyn Buckles, MD  ENTRESTO 49-51 MG TAKE 1 TABLET BY MOUTH TWICE A DAY 01/31/20   [name], Bevelyn Buckles, MD  Fluticasone-Salmeterol (ADVAIR DISKUS) 250-50 MCG/DOSE AEPB Inhale 1 puff into the lungs every 12 (twelve) hours as needed (for flares/shortness of breath). Reported on 02/06/2016    [provider]  hydrALAZINE (APRESOLINE) 25 MG tablet Take 1.5 tablets (37.5 mg total) by mouth 3 (three) times daily. 06/16/19 02/13/20  [name], Bevelyn Buckles, MD  insulin detemir (LEVEMIR) 100 UNIT/ML injection Inject 40 Units into the skin at bedtime.  03/13/14   Aundria Rud, CRNA  insulin lispro (HUMALOG) 100 UNIT/ML injection Inject 5 Units into the skin 3 (three) times daily before meals.    [provider]  isosorbide mononitrate (IMDUR) 30 MG 24 hr tablet TAKE 1 TABLET (30 MG TOTAL) BY MOUTH 2 TIMES DAILY. 09/26/19   [name], Bevelyn Buckles, MD  metolazone (ZAROXOLYN) 2.5 MG tablet Take 2.5 mg by mouth daily as needed (for edema). Reported on 02/06/2016    [provider]  sotalol (BETAPACE) 80 MG tablet TAKE 1 TABLET (80 MG TOTAL) BY MOUTH 2 (TWO) TIMES DAILY. PLEASE KEEP UPCOMING APPOINTMENT FOR FURTHER REFILLS 05/01/20    Allred, Fayrene Fearing, MD  spironolactone (ALDACTONE) 25 MG tablet Take 12.5 mg by mouth daily.  10/28/15   [provider]  torsemide (DEMADEX) 20 MG tablet TAKE 2 TABLETS (40 MG TOTAL) BY MOUTH EVERY MORNING AND 1 TABLET (20 MG TOTAL) AT BEDTIME. 05/07/20   [name], Bevelyn Buckles, MD  XARELTO 20 MG TABS tablet TAKE 1 TABLET (20 MG TOTAL) BY MOUTH DAILY WITH SUPPER. 09/28/19   [name], Bevelyn Buckles, MD    Allergies    Patient has no known allergies.  Review of Systems   Review of Systems  Cardiovascular: Negative for leg swelling.  Musculoskeletal: Positive for myalgias.  Neurological: Negative for weakness and numbness.    Physical Exam Updated Vital Signs BP (!) 110/53 (BP Location: Left Arm)   Pulse 72   Temp 98.7 F (37.1 C) (Oral)   Resp 18   Ht 5\' 5"  (1.651 m)   Wt (!) 142.9 kg   SpO2 100%   BMI 52.42 kg/m   Physical Exam Vitals and nursing note reviewed.  Constitutional:      Appearance: He is well-developed. He is obese.  HENT:     Head: Normocephalic and atraumatic.     Right Ear: External ear normal.     Left Ear: External ear normal.     Nose: Nose normal.  Eyes:     General:        Right eye: No discharge.        Left eye: No discharge.  Cardiovascular:     Rate and Rhythm: Normal rate and regular rhythm.     Pulses:          Dorsalis pedis pulses are 2+ on the right side.  Pulmonary:     Effort: Pulmonary effort is normal.  Abdominal:     General: There is no distension.  Musculoskeletal:     Cervical back: Neck supple.     Right hip: No tenderness. Normal range of motion.     Right upper leg: No tenderness.     Right lower leg: No swelling, tenderness or bony tenderness. No edema.       Legs:  Skin:    General: Skin is warm and dry.  Neurological:     Mental Status: He is alert.  Psychiatric:        Mood and Affect: Mood is not anxious.     ED Results / Procedures / Treatments   Labs (all labs ordered are listed, but only abnormal results  are displayed) Labs Reviewed - No data to display  EKG None  Radiology No results found.  Procedures Ultrasound ED Soft Tissue  Date/Time: 08/12/2020 11:11 PM Performed by: 13/04/2020, MD Authorized by: Pricilla Loveless, MD   Procedure details:    Indications: limb pain     Transverse view:  Visualized   Longitudinal view:  Visualized   Images: archived     Limitations:  Body habitus Location:    Location: lower extremity     Side:  Right Findings:     no abscess present    no cellulitis present    no foreign body present Comments:     Likely lipoma   (including critical care time)  Medications Ordered in ED Medications - No data to display  ED Course  I have reviewed the triage vital signs and the nursing notes.  Pertinent labs & imaging results that were available during my care of the patient were reviewed by me and considered in my medical decision making (see chart for details).    MDM Rules/Calculators/A&P                          Based on mobile mass and ultrasound findings I think this is probably a lipoma.  However I think is reasonable to get a more official ultrasound and because of the location of pain we will also make sure he is not having a DVT, which I think is unlikely.  Ultrasound not available at this facility at this time given lower suspicion  for DVT I think is reasonable to wait until tomorrow.  Otherwise, no abscess or infection seen.  Discharged with return precautions. Final Clinical Impression(s) / ED Diagnoses Final diagnoses:  Right thigh pain    Rx / DC Orders ED Discharge Orders         Ordered    VAS [place] LOWER EXTREMITY VENOUS (DVT)        08/12/20 2230    US Venous Img Lower Unilateral Right        08/12/20 2232           Pricilla Loveless, MD 08/12/20 2313

## 2020-08-13 ENCOUNTER — Ambulatory Visit (INDEPENDENT_AMBULATORY_CARE_PROVIDER_SITE_OTHER): Payer: Medicare Other

## 2020-08-13 ENCOUNTER — Ambulatory Visit (HOSPITAL_BASED_OUTPATIENT_CLINIC_OR_DEPARTMENT_OTHER)
Admission: RE | Admit: 2020-08-13 | Discharge: 2020-08-13 | Disposition: A | Discharge status: Home / Self Care | Payer: Medicare Other | Source: Ambulatory Visit | Attending: Emergency Medicine | Admitting: Emergency Medicine

## 2020-08-13 DIAGNOSIS — I5022 Chronic systolic (congestive) heart failure: Secondary | ICD-10-CM

## 2020-08-13 DIAGNOSIS — Z95810 Presence of automatic (implantable) cardiac defibrillator: Secondary | ICD-10-CM | POA: Diagnosis not present

## 2020-08-13 DIAGNOSIS — M79604 Pain in right leg: Secondary | ICD-10-CM | POA: Insufficient documentation

## 2020-08-13 NOTE — ED Provider Notes (Signed)
Patient was seen yesterday for some right lateral thigh pain.  He presents today to get a duplex to rule out DVT.  His ultrasound is negative for DVT.  I reviewed this with the patient he is comfortable plan for following up with his PCP.  Return instructions discussed   Terrilee Files, MD 08/13/20 1350

## 2020-08-15 NOTE — Progress Notes (Signed)
EPIC Encounter for ICM Monitoring  Patient Name: Gary Hutchinson is a 59 y.o. male Date: 08/15/2020 Primary Care Physican: Creola Corn, MD Primary Cardiologist:[name] Electrophysiologist: Allred Bi-V Pacing: 97.9% 08/15/2020 Weight:314lbs  Time in AT/AF 0.0 hr/day (0.0%)(taking Xarelto)  Spoke with patient and reports he thought he had some fluid yesterday and took a metolazone.  Optivol thoracic impedancesuggestingnormalfluid levels.  Prescribed dosage:  Torsemide 20 mg 2 tablets (40 mg total)every morning and 1 tablet(20 mg total)every evening.  Metolazone 2.5 mg 1 tablet as needed.  Labs: 12/14/2019 Creatinine 1.73, BUN 62, Potassium 3.8, Sodium 136, GFR 43-49 05/24/2019 Creatinine 1.83, BUN 58, Potassium 3.5, Sodium 135, GFR 40-46 01/06/2020Creatinine 1.54, BUN40, Potassium 4.0, Sodium 133, GFR 49-57  Recommendations:No changes and encouraged to call if experiencing any fluid symptoms.  Follow-up plan: ICM clinic phone appointment on12/13/2021. 91 day device clinic remote transmission 10/12/2020.   EP/Cardiology Office Visits: Advised to call Dr [name]'s office for 6 month follow up that was due 06/11/2020.Last EP visit was 02/13/2020 and no recall for year appointment  Copy of ICM check sent to Dr.Allred.   3 month ICM trend: 08/13/2020    1 Year ICM trend:       Karie Soda, RN 08/15/2020 11:38 AM

## 2020-09-05 ENCOUNTER — Other Ambulatory Visit (HOSPITAL_COMMUNITY): Payer: Self-pay | Admitting: Internal Medicine

## 2020-09-14 ENCOUNTER — Encounter (HOSPITAL_COMMUNITY): Payer: Self-pay | Admitting: Internal Medicine

## 2020-09-14 ENCOUNTER — Ambulatory Visit (HOSPITAL_COMMUNITY)
Admission: RE | Admit: 2020-09-14 | Discharge: 2020-09-14 | Disposition: A | Discharge status: Home / Self Care | Payer: Medicare Other | Source: Ambulatory Visit | Attending: Internal Medicine | Admitting: Internal Medicine

## 2020-09-14 ENCOUNTER — Other Ambulatory Visit: Payer: Self-pay

## 2020-09-14 VITALS — BP 110/70 | HR 73 | Wt 309.6 lb

## 2020-09-14 DIAGNOSIS — I472 Ventricular tachycardia: Secondary | ICD-10-CM | POA: Insufficient documentation

## 2020-09-14 DIAGNOSIS — G4733 Obstructive sleep apnea (adult) (pediatric): Secondary | ICD-10-CM | POA: Diagnosis not present

## 2020-09-14 DIAGNOSIS — Z794 Long term (current) use of insulin: Secondary | ICD-10-CM | POA: Insufficient documentation

## 2020-09-14 DIAGNOSIS — E1122 Type 2 diabetes mellitus with diabetic chronic kidney disease: Secondary | ICD-10-CM | POA: Diagnosis not present

## 2020-09-14 DIAGNOSIS — Z6841 Body Mass Index (BMI) 40.0 and over, adult: Secondary | ICD-10-CM | POA: Diagnosis not present

## 2020-09-14 DIAGNOSIS — I480 Paroxysmal atrial fibrillation: Secondary | ICD-10-CM | POA: Diagnosis not present

## 2020-09-14 DIAGNOSIS — E785 Hyperlipidemia, unspecified: Secondary | ICD-10-CM | POA: Diagnosis not present

## 2020-09-14 DIAGNOSIS — I428 Other cardiomyopathies: Secondary | ICD-10-CM | POA: Insufficient documentation

## 2020-09-14 DIAGNOSIS — N1832 Chronic kidney disease, stage 3b: Secondary | ICD-10-CM | POA: Diagnosis not present

## 2020-09-14 DIAGNOSIS — I130 Hypertensive heart and chronic kidney disease with heart failure and stage 1 through stage 4 chronic kidney disease, or unspecified chronic kidney disease: Secondary | ICD-10-CM | POA: Insufficient documentation

## 2020-09-14 DIAGNOSIS — I10 Essential (primary) hypertension: Secondary | ICD-10-CM | POA: Diagnosis not present

## 2020-09-14 DIAGNOSIS — Z79899 Other long term (current) drug therapy: Secondary | ICD-10-CM | POA: Insufficient documentation

## 2020-09-14 DIAGNOSIS — Z7901 Long term (current) use of anticoagulants: Secondary | ICD-10-CM | POA: Insufficient documentation

## 2020-09-14 DIAGNOSIS — N183 Chronic kidney disease, stage 3 unspecified: Secondary | ICD-10-CM | POA: Insufficient documentation

## 2020-09-14 DIAGNOSIS — Z95810 Presence of automatic (implantable) cardiac defibrillator: Secondary | ICD-10-CM | POA: Insufficient documentation

## 2020-09-14 DIAGNOSIS — I5022 Chronic systolic (congestive) heart failure: Secondary | ICD-10-CM | POA: Insufficient documentation

## 2020-09-14 LAB — CBC
HCT: 40.7 % (ref 39.0–52.0)
Hemoglobin: 13.2 g/dL (ref 13.0–17.0)
MCH: 26.8 pg (ref 26.0–34.0)
MCHC: 32.4 g/dL (ref 30.0–36.0)
MCV: 82.6 fL (ref 80.0–100.0)
Platelets: 235 10*3/uL (ref 150–400)
RBC: 4.93 MIL/uL (ref 4.22–5.81)
RDW: 15.9 % — ABNORMAL HIGH (ref 11.5–15.5)
WBC: 8.4 10*3/uL (ref 4.0–10.5)
nRBC: 0 % (ref 0.0–0.2)

## 2020-09-14 LAB — BASIC METABOLIC PANEL
Anion gap: 12 (ref 5–15)
BUN: 51 mg/dL — ABNORMAL HIGH (ref 6–20)
CO2: 26 mmol/L (ref 22–32)
Calcium: 9.5 mg/dL (ref 8.9–10.3)
Chloride: 98 mmol/L (ref 98–111)
Creatinine, Ser: 1.83 mg/dL — ABNORMAL HIGH (ref 0.61–1.24)
GFR, Estimated: 42 mL/min — ABNORMAL LOW (ref >60)
Glucose, Bld: 182 mg/dL — ABNORMAL HIGH (ref 70–99)
Potassium: 3.7 mmol/L (ref 3.5–5.1)
Sodium: 136 mmol/L (ref 135–145)

## 2020-09-14 LAB — DIGOXIN LEVEL: Digoxin Level: 0.5 ng/mL — ABNORMAL LOW (ref 0.8–2.0)

## 2020-09-14 LAB — MAGNESIUM: Magnesium: 2.6 mg/dL — ABNORMAL HIGH (ref 1.7–2.4)

## 2020-09-14 MED ORDER — ENTRESTO 97-103 MG PO TABS
1.0000 | ORAL_TABLET | Freq: Two times a day (BID) | ORAL | 11 refills | Status: DC
Start: 2020-09-14 — End: 2022-01-22

## 2020-09-14 NOTE — Progress Notes (Signed)
Patient ID: Gary Hutchinson, male   DOB: Feb 15, 1961, 59 y.o.   MRN: [id number]    Advanced Heart Failure Clinic Note   PCP: Creola Corn EP: Hillis Range  HPI: Gary Hutchinson is a 59 y.o. male with a history of chronic systolic heart failure, ICM s/p ICD, HTN, DM II, OSA on CPAP, history of ventricular tachycardia status post AICD placement in 2009, atrial fibrillation and morbid obesity.    Admitted 6/15 for A/C HF and cardiogenic shock (co-ox 42%). Diuresed with IV lasix and milrinone which were weaned off.  He went into Afib with RVR and chemically cardioverted back to NSR with IV amiodarone.   RHC 8/15): Left sided filling pressures well compensated, mild pulm HTN with elevated right-sided pressures and mod/severely depressed CO. Discussion about trial of milrinone but patient wanted to hold off.  Admitted 1/17 with volume overload, cellulitis and AF. Diuresed with IV lasix and required short term milrinone. Loaded on amio and had successful DC-CV. Discharge weight was 307 pounds.   Admitted 2/18 with Influenza A/B. 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 Seen by EP and managed conservatively.  S/p successful DCCV 07/29/2017.  Today he returns for HF follow up. Overall doing fairly well. Walks every day about 30-45 minutes, no SOB at rest. No edema, orthopnea/ PND. Fluid level up and down but follows closely with Randon Goldsmith. And adjusts diuretic and add a metolazone when she calls ~ About 1-2x/month. No dizziness. No bleeding with AC. Weight at home ~315 pounds. Trying to watch salt and fluid intake. BPs at home ~115-125.  Device Interrogation: OptiVol impedence well below threshold, thoracic impedence above threshold, no AT/AF, patient activity ~1.5-2 hrs/day (personally reviewed).   05/03/12: EF 25-30%.  Grade 1 diastolic dysfunction.  Mild MR.  Mod dilated LA.   07/27/13: EF 40%, diff HK, MR mild, LA mild/mod dilated 03/2014: EF30-35%, grade II DD, mild MR, RV  systolic fx mildly decreased 10/2014: EF 40%.  10/2015: EF ~40%. RV mildly dilated.  8/17 EF 25-35% 07/27/2017 EF  25- 30% 3/21: EF ~30% RV ok. Personally reviewed  SH: Lives with son in [place]. Disabled. No ETOH or tobacco abuse  FH; Mother living: HT        Father deceased: was not part of his life so not sure health issues  Review of systems complete and found to be negative unless listed in HPI.   Past Medical History:  Diagnosis Date  . AICD (automatic cardioverter/defibrillator) present    Medtronic  . Alcohol abuse    now quit  . Anemia    iron defi  . Arthritis    "fingers, knees, some days all my joints ache" (11/16/2015)  . Asthma   . Cholecystitis   . Chronic systolic congestive heart failure (HCC)    a. RHC (02/2011) RA 31/27, RV 71/27, PA67/45, PCWP 46, PA 49%, Fick CO: 3.4 b. ECHO (03/2014) EF 30-35%, grade II DD, mild MR, RV poorly visualized appears mildly decreased c. RHC (05/2014) RA 13, RV 54/4/11, PA 60/22 (37), PCWP 17, Fick CO/CI: 4.4 / 1.9, Thermo CO/CI: 3.8 /1.6, PVR 5.2 WU, PA 57% and 59%  . Diverticulosis of colon   . Gout   . Hemorrhoids, internal   . Hyperlipidemia   . Hypertension   . Morbid obesity (HCC)   . Nonischemic cardiomyopathy (HCC)    a. LHC (02/2011) normal coronaries  . OSA on CPAP   . Paroxysmal atrial fibrillation (HCC)   .  Pneumonia 2000s X 1  . Pulmonary hypertension (HCC)   . Renal insufficiency   . Type II diabetes mellitus (HCC)   . Ventricular tachycardia [facility])    s/p MDT ICD implant    Current Outpatient Medications  Medication Sig Dispense Refill  . acetaminophen (TYLENOL) 500 MG tablet Take 500 mg by mouth every 6 (six) hours as needed for moderate pain or headache.     . albuterol (VENTOLIN HFA) 108 (90 Base) MCG/ACT inhaler Inhale 2 puffs into the lungs every 6 (six) hours as needed for wheezing or shortness of breath. Reported on 02/06/2016    . allopurinol (ZYLOPRIM) 300 MG tablet Take 300 mg by mouth daily.    Marland Kitchen  atorvastatin (LIPITOR) 80 MG tablet Take 80 mg by mouth daily.    . colchicine 0.6 MG tablet Take 0.6 mg by mouth daily as needed (for gout flares).     . dapagliflozin propanediol (FARXIGA) 10 MG TABS tablet Take 10 mg by mouth daily.    . digoxin (LANOXIN) 0.25 MG tablet TAKE 1/2 TABLET BY MOUTH EVERY OTHER DAY 45 tablet 3  . ENTRESTO 49-51 MG TAKE 1 TABLET BY MOUTH TWICE A DAY 180 tablet 3  . Fluticasone-Salmeterol (ADVAIR) 250-50 MCG/DOSE AEPB Inhale 1 puff into the lungs every 12 (twelve) hours as needed (for flares/shortness of breath). Reported on 02/06/2016    . hydrALAZINE (APRESOLINE) 25 MG tablet TAKE 1.5 TABLETS (37.5 MG TOTAL) BY MOUTH 3 (THREE) TIMES DAILY. 405 tablet 3  . insulin detemir (LEVEMIR) 100 UNIT/ML injection Inject 40 Units into the skin at bedtime.     . insulin lispro (HUMALOG) 100 UNIT/ML injection Inject 5 Units into the skin 3 (three) times daily before meals.    . isosorbide mononitrate (IMDUR) 30 MG 24 hr tablet TAKE 1 TABLET (30 MG TOTAL) BY MOUTH 2 TIMES DAILY. 180 tablet 3  . metolazone (ZAROXOLYN) 2.5 MG tablet Take 2.5 mg by mouth daily as needed (for edema). Reported on 02/06/2016    . sotalol (BETAPACE) 80 MG tablet TAKE 1 TABLET (80 MG TOTAL) BY MOUTH 2 (TWO) TIMES DAILY. PLEASE KEEP UPCOMING APPOINTMENT FOR FURTHER REFILLS 180 tablet 2  . spironolactone (ALDACTONE) 25 MG tablet Take 12.5 mg by mouth daily.     Marland Kitchen torsemide (DEMADEX) 20 MG tablet TAKE 2 TABLETS (40 MG TOTAL) BY MOUTH EVERY MORNING AND 1 TABLET (20 MG TOTAL) AT BEDTIME. 270 tablet 3  . XARELTO 20 MG TABS tablet TAKE 1 TABLET (20 MG TOTAL) BY MOUTH DAILY WITH SUPPER. 90 tablet 3   No current facility-administered medications for this encounter.   Vitals:   09/14/20 1036  BP: 110/70  Pulse: 73  SpO2: 98%  Weight: (!) 140.4 kg (309 lb 9.6 oz)   Wt Readings from Last 3 Encounters:  09/14/20 (!) 140.4 kg (309 lb 9.6 oz)  08/12/20 (!) 142.9 kg (315 lb)  04/10/20 (!) 144.4 kg (318 lb 6.4 oz)     General:  Well appearing. No resp difficulty HEENT: normal Neck: supple. Difficult to assess JVP due to body habitus. Carotids 2+ bilat; no bruits. No lymphadenopathy or thryomegaly appreciated. Cor: PMI nondisplaced. Regular rate & rhythm. No rubs, gallops or murmurs. Lungs: clear, diminished in bases Abdomen: obese soft, nontender, nondistended. No hepatosplenomegaly. No bruits or masses. Good bowel sounds. Extremities: no cyanosis, clubbing, rash, edema Neuro: alert & orientedx3, cranial nerves grossly intact. moves all 4 extremities w/o difficulty. Affect pleasant  ECG: SR/V-paced 79 bpm, qtc 506 ms (Personally  reviewed)  Device Interrogation: OptiVol impedence well below threshold, thoracic impedence above threshold, no AT/AF, patient activity ~1.5-2 hrs/day (personally reviewed).  ASSESSMENT & PLAN:  1) Chronic systolic HF: NICM s/p BiV Medtronic - Echo 07/2017 LVEF 25-30%.  - Echo 12/14/19 EF 30% Personally reviewed - NYHA II. Volume status stable on exam and Optivol. - Continue torsemide 40 mg/20 mg.  - Increase Entresto to 97/103 mg bid - Off bisoprolol in 2018. On sotalol.  - Continue digoxin 0.0625 mg every other day.  - Continue hydralazine 37.5 mg TID.  - Continue imdur 30 mg BID.  - Continue Farxiga 10 mg daily. No GU symptoms - Check BMET & dig level today.   2) CKD stage III  - Followed by nephrology. - Creatinine 1.5-1.9 - Check BMET today.    3) HTN  - Stable today - Continue Farxiga 10 mg daily.  4) OSA  - Continue Bipap. Compliant   5) PAF/AFL - S/P multiple DCCVs. Most recent 07/29/17  - Had episode of AFL with ICD shock in 2/18 at time of influenza - Pt refused ablation consideration.  - Continue Xarelto 20 mg daily. Denies bleeding.  - Continue Sotalol per EP.  - ECG in NSR/V-paced today  - Mag and BMET today  6) Obesity:   - Body mass index is 51.52 kg/m.  - Congratulated him on daily walking efforts. - Encouraged portion control and  limiting fluids.  7) Amiodarone thyroxicity   - Off Amiodarone. Completed Methimazole treatment per PCP.   - No change  8) DM2 - Continue insulin +Farxiga. - followed by PCP.   Arvilla Meres, MD  09/14/2020 11:17 AM

## 2020-09-14 NOTE — Patient Instructions (Signed)
INCREASE Entresto to 97-103 mg, one tab twice a day  Labs today We will only contact you if something comes back abnormal or we need to make some changes. Otherwise no news is good news!  Your physician recommends that you schedule a follow-up appointment in: 4 months with Dr Gala Romney -please call in March for a April 2022 follow up appt  If you have any questions or concerns before your next appointment please send [place] a message through [place] or call our office at [phone].    TO LEAVE A MESSAGE FOR THE NURSE SELECT OPTION 2, PLEASE LEAVE A MESSAGE INCLUDING: . YOUR NAME . DATE OF BIRTH . CALL BACK NUMBER . REASON FOR CALL**this is important as we prioritize the call backs  YOU WILL RECEIVE A CALL BACK THE SAME DAY AS LONG AS YOU CALL BEFORE 4:00 PM

## 2020-09-16 NOTE — Addendum Note (Signed)
Encounter addended by: [name], MD on: 09/16/2020 4:41 PM  Actions taken: Level of Service modified, Visit diagnoses modified, Charge Capture section accepted

## 2020-09-17 ENCOUNTER — Ambulatory Visit (INDEPENDENT_AMBULATORY_CARE_PROVIDER_SITE_OTHER): Payer: Medicare Other

## 2020-09-17 DIAGNOSIS — I5022 Chronic systolic (congestive) heart failure: Secondary | ICD-10-CM

## 2020-09-17 DIAGNOSIS — Z95810 Presence of automatic (implantable) cardiac defibrillator: Secondary | ICD-10-CM | POA: Diagnosis not present

## 2020-09-19 NOTE — Progress Notes (Signed)
EPIC Encounter for ICM Monitoring  Patient Name: Gary Hutchinson is a 59 y.o. male Date: 09/19/2020 Primary Care Physican: Creola Corn, MD Primary Cardiologist:[name] Electrophysiologist: Allred Bi-V Pacing: 97.9% 09/19/2020 Weight:311lbs  Time in AT/AF 0.0 hr/day (0.0%)(taking Xarelto)  Spoke with patient and reports feeling well at this time.  Denies fluid symptoms.    Optivol thoracic impedancesuggestingnormalfluid levels.  Prescribed dosage:  Torsemide 20 mg 2 tablets (40 mg total)every morning and 1 tablet(20 mg total)every evening.  Metolazone 2.5 mg 1 tablet as needed.  Labs: 09/05/2020 Creatinine 1.83, BUN 51, Potassium 3.7, Sodium 136, GFR 42 12/14/2019 Creatinine 1.73, BUN 62, Potassium 3.8, Sodium 136, GFR 43-49 05/24/2019 Creatinine 1.83, BUN 58, Potassium 3.5, Sodium 135, GFR 40-46 01/06/2020Creatinine 1.54, BUN40, Potassium 4.0, Sodium 133, GFR 49-57  Recommendations:No changes and encouraged to call if experiencing any fluid symptoms.  Follow-up plan: ICM clinic phone appointment on2/11/2020. 91 day device clinic remote transmission 10/12/2020.   EP/Cardiology Office Visits: Last EP visit was 02/13/2020 and no recall for year appointment  Copy of ICM check sent to Dr.Allred.  3 month ICM trend: 09/17/2020    1 Year ICM trend:       Karie Soda, RN 09/19/2020 8:47 AM

## 2020-10-12 ENCOUNTER — Ambulatory Visit (INDEPENDENT_AMBULATORY_CARE_PROVIDER_SITE_OTHER): Payer: Medicare Other

## 2020-10-12 DIAGNOSIS — I428 Other cardiomyopathies: Secondary | ICD-10-CM

## 2020-10-12 LAB — CUP PACEART REMOTE DEVICE CHECK
Battery Remaining Longevity: 36 mo
Battery Voltage: 2.96 V
Brady Statistic AP VP Percent: 5.39 %
Brady Statistic AP VS Percent: 0.12 %
Brady Statistic AS VP Percent: 92.74 %
Brady Statistic AS VS Percent: 1.74 %
Brady Statistic RA Percent Paced: 5.51 %
Brady Statistic RV Percent Paced: 0.54 %
Date Time Interrogation Session: 20220107033425
HighPow Impedance: 100 Ohm
Implantable Lead Implant Date: 20090901
Implantable Lead Implant Date: 20170210
Implantable Lead Implant Date: 20170210
Implantable Lead Location: 753858
Implantable Lead Location: 753859
Implantable Lead Location: 753860
Implantable Lead Model: 4598
Implantable Lead Model: 5076
Implantable Lead Model: 6935
Implantable Pulse Generator Implant Date: 20170210
Lead Channel Impedance Value: 1007 Ohm
Lead Channel Impedance Value: 1026 Ohm
Lead Channel Impedance Value: 342 Ohm
Lead Channel Impedance Value: 418 Ohm
Lead Channel Impedance Value: 456 Ohm
Lead Channel Impedance Value: 456 Ohm
Lead Channel Impedance Value: 456 Ohm
Lead Channel Impedance Value: 513 Ohm
Lead Channel Impedance Value: 551 Ohm
Lead Channel Impedance Value: 646 Ohm
Lead Channel Impedance Value: 779 Ohm
Lead Channel Impedance Value: 836 Ohm
Lead Channel Impedance Value: 950 Ohm
Lead Channel Pacing Threshold Amplitude: 0.625 V
Lead Channel Pacing Threshold Amplitude: 0.75 V
Lead Channel Pacing Threshold Amplitude: 1.375 V
Lead Channel Pacing Threshold Pulse Width: 0.4 ms
Lead Channel Pacing Threshold Pulse Width: 0.4 ms
Lead Channel Pacing Threshold Pulse Width: 0.4 ms
Lead Channel Sensing Intrinsic Amplitude: 2 mV
Lead Channel Sensing Intrinsic Amplitude: 2 mV
Lead Channel Sensing Intrinsic Amplitude: 8.5 mV
Lead Channel Sensing Intrinsic Amplitude: 8.5 mV
Lead Channel Setting Pacing Amplitude: 1.5 V
Lead Channel Setting Pacing Amplitude: 2 V
Lead Channel Setting Pacing Amplitude: 2.5 V
Lead Channel Setting Pacing Pulse Width: 0.4 ms
Lead Channel Setting Pacing Pulse Width: 0.4 ms
Lead Channel Setting Sensing Sensitivity: 0.3 mV

## 2020-10-26 NOTE — Progress Notes (Signed)
Remote ICD transmission.   

## 2020-11-07 ENCOUNTER — Ambulatory Visit (INDEPENDENT_AMBULATORY_CARE_PROVIDER_SITE_OTHER): Payer: Medicare Other

## 2020-11-07 DIAGNOSIS — Z95810 Presence of automatic (implantable) cardiac defibrillator: Secondary | ICD-10-CM

## 2020-11-07 DIAGNOSIS — I5022 Chronic systolic (congestive) heart failure: Secondary | ICD-10-CM | POA: Diagnosis not present

## 2020-11-12 NOTE — Progress Notes (Signed)
EPIC Encounter for ICM Monitoring  Patient Name: Gary Hutchinson is a 60 y.o. male Date: 11/12/2020 Primary Care Physican: Creola Corn, MD Primary Cardiologist:[name] Electrophysiologist: Allred Bi-V Pacing: 98% 11/12/2020 Weight:312lbs  Time in AT/AF 0.0 hr/day (0.0%)(taking Xarelto)  Spoke with patient and reports feeling well at this time.  Denies fluid symptoms.    Optivol thoracic impedancesuggestingnormalfluid levels.  Prescribed dosage:  Torsemide 20 mg 2 tablets (40 mg total)every morning and 1 tablet(20 mg total)every evening.  Metolazone 2.5 mg 1 tablet as needed.  Labs: 09/14/2020 Creatinine 1.83, BUN 51, Potassium 3.7, Sodium 136, GFR 42 12/14/2019 Creatinine 1.73, BUN 62, Potassium 3.8, Sodium 136, GFR 43-49 05/24/2019 Creatinine 1.83, BUN 58, Potassium 3.5, Sodium 135, GFR 40-46 01/06/2020Creatinine 1.54, BUN40, Potassium 4.0, Sodium 133, GFR 49-57  Recommendations:No changes and encouraged to call if experiencing any fluid symptoms.  Follow-up plan: ICM clinic phone appointment on3/04/2021. 91 day device clinic remote transmission4/05/2021.   EP/Cardiology Office Visits: Last EP visit was 02/13/2020 and no recall for year appointment  Copy of ICM check sent to Dr.Allred.  3 month ICM trend: 11/07/2020.    1 Year ICM trend:       Karie Soda, RN 11/12/2020 4:56 PM

## 2020-11-14 ENCOUNTER — Other Ambulatory Visit (HOSPITAL_COMMUNITY): Payer: Self-pay | Admitting: Internal Medicine

## 2020-12-10 ENCOUNTER — Ambulatory Visit (INDEPENDENT_AMBULATORY_CARE_PROVIDER_SITE_OTHER): Payer: Medicare Other

## 2020-12-10 DIAGNOSIS — Z95810 Presence of automatic (implantable) cardiac defibrillator: Secondary | ICD-10-CM

## 2020-12-10 DIAGNOSIS — I5022 Chronic systolic (congestive) heart failure: Secondary | ICD-10-CM | POA: Diagnosis not present

## 2020-12-12 NOTE — Progress Notes (Signed)
EPIC Encounter for ICM Monitoring  Patient Name: Gary Hutchinson is a 60 y.o. male Date: 12/12/2020 Primary Care Physican: Creola Corn, MD Primary Cardiologist:[name] Electrophysiologist: Allred Bi-V Pacing: 97.9% 12/12/2020 Weight:314lbs  Time in AT/AF 0.0 hr/day (0.0%)(taking Xarelto)  Spoke with patient and reports weight gain and feeling like he had fluid. He took Metolazone on 3/4 with relief of symptoms.   Optivol thoracic impedancesuggestingnormalfluid levels.  Prescribed dosage:  Torsemide 20 mg 2 tablets (40 mg total)every morning and 1 tablet(20 mg total)every evening.  Metolazone 2.5 mg 1 tablet as needed.  Labs: 09/14/2020 Creatinine 1.83, BUN 51, Potassium 3.7, Sodium 136, GFR 42 12/14/2019 Creatinine 1.73, BUN 62, Potassium 3.8, Sodium 136, GFR 43-49 05/24/2019 Creatinine 1.83, BUN 58, Potassium 3.5, Sodium 135, GFR 40-46 01/06/2020Creatinine 1.54, BUN40, Potassium 4.0, Sodium 133, GFR 49-57  Recommendations:No changes and encouraged to call if experiencing any fluid symptoms.  Follow-up plan: ICM clinic phone appointment on4/08/2021. 91 day device clinic remote transmission4/05/2021.   EP/Cardiology Office Visits: Advised to call office to schedule office visit with Dr [name] fo yearly appointment (02/13/2020 was last EP appt).  Copy of ICM check sent to Dr.Allred.  3 month ICM trend: 12/10/2020.    1 Year ICM trend:       Karie Soda, RN 12/12/2020 4:52 PM

## 2020-12-20 ENCOUNTER — Other Ambulatory Visit (HOSPITAL_COMMUNITY): Payer: Self-pay | Admitting: Internal Medicine

## 2021-01-02 ENCOUNTER — Encounter: Payer: Medicare Other | Admitting: Student

## 2021-01-02 ENCOUNTER — Other Ambulatory Visit: Payer: Self-pay

## 2021-01-02 ENCOUNTER — Encounter: Payer: Self-pay | Admitting: Student

## 2021-01-02 ENCOUNTER — Ambulatory Visit (INDEPENDENT_AMBULATORY_CARE_PROVIDER_SITE_OTHER): Payer: Medicare Other | Admitting: Student

## 2021-01-02 VITALS — BP 100/68 | HR 68 | Ht 65.0 in | Wt 313.0 lb

## 2021-01-02 DIAGNOSIS — I472 Ventricular tachycardia, unspecified: Secondary | ICD-10-CM

## 2021-01-02 DIAGNOSIS — I5022 Chronic systolic (congestive) heart failure: Secondary | ICD-10-CM

## 2021-01-02 DIAGNOSIS — Z95810 Presence of automatic (implantable) cardiac defibrillator: Secondary | ICD-10-CM

## 2021-01-02 DIAGNOSIS — I428 Other cardiomyopathies: Secondary | ICD-10-CM | POA: Diagnosis not present

## 2021-01-02 LAB — CUP PACEART INCLINIC DEVICE CHECK
Battery Remaining Longevity: 34 mo
Battery Voltage: 2.94 V
Brady Statistic AP VP Percent: 8.12 %
Brady Statistic AP VS Percent: 0.18 %
Brady Statistic AS VP Percent: 90.06 %
Brady Statistic AS VS Percent: 1.64 %
Brady Statistic RA Percent Paced: 8.28 %
Brady Statistic RV Percent Paced: 0.51 %
Date Time Interrogation Session: 20220330143857
HighPow Impedance: 86 Ohm
Implantable Lead Implant Date: 20090901
Implantable Lead Implant Date: 20170210
Implantable Lead Implant Date: 20170210
Implantable Lead Location: 753858
Implantable Lead Location: 753859
Implantable Lead Location: 753860
Implantable Lead Model: 4598
Implantable Lead Model: 5076
Implantable Lead Model: 6935
Implantable Pulse Generator Implant Date: 20170210
Lead Channel Impedance Value: 1064 Ohm
Lead Channel Impedance Value: 1064 Ohm
Lead Channel Impedance Value: 1121 Ohm
Lead Channel Impedance Value: 342 Ohm
Lead Channel Impedance Value: 418 Ohm
Lead Channel Impedance Value: 456 Ohm
Lead Channel Impedance Value: 456 Ohm
Lead Channel Impedance Value: 475 Ohm
Lead Channel Impedance Value: 532 Ohm
Lead Channel Impedance Value: 608 Ohm
Lead Channel Impedance Value: 703 Ohm
Lead Channel Impedance Value: 779 Ohm
Lead Channel Impedance Value: 874 Ohm
Lead Channel Pacing Threshold Amplitude: 0.625 V
Lead Channel Pacing Threshold Amplitude: 0.875 V
Lead Channel Pacing Threshold Amplitude: 1.5 V
Lead Channel Pacing Threshold Pulse Width: 0.4 ms
Lead Channel Pacing Threshold Pulse Width: 0.4 ms
Lead Channel Pacing Threshold Pulse Width: 0.4 ms
Lead Channel Sensing Intrinsic Amplitude: 10.625 mV
Lead Channel Sensing Intrinsic Amplitude: 11 mV
Lead Channel Sensing Intrinsic Amplitude: 2.375 mV
Lead Channel Sensing Intrinsic Amplitude: 3.125 mV
Lead Channel Setting Pacing Amplitude: 1.5 V
Lead Channel Setting Pacing Amplitude: 2 V
Lead Channel Setting Pacing Amplitude: 2.5 V
Lead Channel Setting Pacing Pulse Width: 0.4 ms
Lead Channel Setting Pacing Pulse Width: 0.4 ms
Lead Channel Setting Sensing Sensitivity: 0.3 mV

## 2021-01-02 NOTE — Progress Notes (Signed)
Electrophysiology Office Note Date: 01/02/2021  ID:  Gary Hutchinson, DOB [birth year], MRN [id number]  PCP: Creola Corn, MD Primary Cardiologist: No primary care provider on file. Electrophysiologist: Hillis Range, MD   CC: Routine ICD follow-up  Gary Hutchinson is a 60 y.o. male seen today for Hillis Range, MD for routine electrophysiology followup.  Since last being seen in our clinic the patient reports doing well.  he denies chest pain, palpitations, dyspnea, PND, orthopnea, nausea, vomiting, dizziness, syncope, edema, weight gain, or early satiety. He has not had ICD shocks.   Device History: Medtronic BiV ICD implanted 11/2015 for CHF + inappropriate shock for rapid Aflutter (atypical) in the setting of illness w/flu Dec 2018, inappropriate shock for 1:1 AFlutter  AAD:  amiodarone developed thyrotoxicity Dec 2018 started on Sotalol  Past Medical History:  Diagnosis Date  . AICD (automatic cardioverter/defibrillator) present    Medtronic  . Alcohol abuse    now quit  . Anemia    iron defi  . Arthritis    "fingers, knees, some days all my joints ache" (11/16/2015)  . Asthma   . Cholecystitis   . Chronic systolic congestive heart failure (HCC)    a. RHC (02/2011) RA 31/27, RV 71/27, PA67/45, PCWP 46, PA 49%, Fick CO: 3.4 b. ECHO (03/2014) EF 30-35%, grade II DD, mild MR, RV poorly visualized appears mildly decreased c. RHC (05/2014) RA 13, RV 54/4/11, PA 60/22 (37), PCWP 17, Fick CO/CI: 4.4 / 1.9, Thermo CO/CI: 3.8 /1.6, PVR 5.2 WU, PA 57% and 59%  . Diverticulosis of colon   . Gout   . Hemorrhoids, internal   . Hyperlipidemia   . Hypertension   . Morbid obesity (HCC)   . Nonischemic cardiomyopathy (HCC)    a. LHC (02/2011) normal coronaries  . OSA on CPAP   . Paroxysmal atrial fibrillation (HCC)   . Pneumonia 2000s X 1  . Pulmonary hypertension (HCC)   . Renal insufficiency   . Type II diabetes mellitus (HCC)   . Ventricular tachycardia [facility])    s/p MDT ICD implant    Past Surgical History:  Procedure Laterality Date  . CARDIAC CATHETERIZATION  03/08/2004   EF 25-30%  . CARDIOVERSION N/A 10/17/2015   Procedure: CARDIOVERSION;  Surgeon: [name], MD;  Location: [facility] ENDOSCOPY;  Service: Cardiovascular;  Laterality: N/A;  . EP IMPLANTABLE DEVICE N/A 11/16/2015   Procedure: BiVI Upgrade;  Surgeon: Hillis Range, MD;  Medtronic [address] [place]  . INSERTION OF ICD  06/2008   ICD- Medtronic   . RIGHT HEART CATHETERIZATION N/A 05/23/2014   Procedure: RIGHT HEART CATH;  Surgeon: [name], MD;  Location: [facility] CATH LAB;  Service: Cardiovascular;  Laterality: N/A;  . TRANSTHORACIC ECHOCARDIOGRAM  05/27/2008   EF 30-35%  . US ECHOCARDIOGRAPHY  08/22/2008   EF 30-35%    Current Outpatient Medications  Medication Sig Dispense Refill  . acetaminophen (TYLENOL) 500 MG tablet Take 500 mg by mouth every 6 (six) hours as needed for moderate pain or headache.     . albuterol (VENTOLIN HFA) 108 (90 Base) MCG/ACT inhaler Inhale 2 puffs into the lungs every 6 (six) hours as needed for wheezing or shortness of breath. Reported on 02/06/2016    . allopurinol (ZYLOPRIM) 100 MG tablet Take 100 mg by mouth daily.    Marland Kitchen atorvastatin (LIPITOR) 80 MG tablet Take 80 mg by mouth daily.    . colchicine 0.6 MG tablet Take 0.6 mg by mouth daily as  needed (for gout flares).     . dapagliflozin propanediol (FARXIGA) 10 MG TABS tablet Take 10 mg by mouth daily.    . digoxin (LANOXIN) 0.25 MG tablet TAKE 1/2 TABLET BY MOUTH EVERY OTHER DAY 45 tablet 3  . Fluticasone-Salmeterol (ADVAIR) 250-50 MCG/DOSE AEPB Inhale 1 puff into the lungs every 12 (twelve) hours as needed (for flares/shortness of breath). Reported on 02/06/2016    . hydrALAZINE (APRESOLINE) 25 MG tablet TAKE 1.5 TABLETS (37.5 MG TOTAL) BY MOUTH 3 (THREE) TIMES DAILY. 405 tablet 3  . insulin detemir (LEVEMIR) 100 UNIT/ML injection Inject 40 Units into the skin at bedtime.     . insulin lispro (HUMALOG) 100 UNIT/ML injection  Inject 5 Units into the skin 3 (three) times daily before meals.    . isosorbide mononitrate (IMDUR) 30 MG 24 hr tablet TAKE 1 TABLET (30 MG TOTAL) BY MOUTH 2 TIMES DAILY. 180 tablet 3  . metolazone (ZAROXOLYN) 2.5 MG tablet Take 2.5 mg by mouth daily as needed (for edema). Reported on 02/06/2016    . montelukast (SINGULAIR) 10 MG tablet Take by mouth as needed.    . sacubitril-valsartan (ENTRESTO) 97-103 MG Take 1 tablet by mouth 2 (two) times daily. 60 tablet 11  . sotalol (BETAPACE) 80 MG tablet TAKE 1 TABLET (80 MG TOTAL) BY MOUTH 2 (TWO) TIMES DAILY. PLEASE KEEP UPCOMING APPOINTMENT FOR FURTHER REFILLS 180 tablet 2  . spironolactone (ALDACTONE) 25 MG tablet Take 12.5 mg by mouth daily.     Marland Kitchen torsemide (DEMADEX) 20 MG tablet TAKE 2 TABLETS (40 MG TOTAL) BY MOUTH EVERY MORNING AND 1 TABLET (20 MG TOTAL) AT BEDTIME. 270 tablet 3  . traMADol (ULTRAM) 50 MG tablet as needed.    Carlena Hurl 20 MG TABS tablet TAKE 1 TABLET (20 MG TOTAL) BY MOUTH DAILY WITH SUPPER. 90 tablet 3   No current facility-administered medications for this visit.    Allergies:   Patient has no known allergies.   Social History: Social History   Socioeconomic History  . Marital status: Single    Spouse name: Not on file  . Number of children: 4  . Years of education: Not on file  . Highest education level: Not on file  Occupational History    Employer: DISABLED  Tobacco Use  . Smoking status: Never Smoker  . Smokeless tobacco: Never Used  Vaping Use  . Vaping Use: Never used  Substance and Sexual Activity  . Alcohol use: Not Currently    Comment: former heavy ETOH, quit in "the late '90s"  . Drug use: No  . Sexual activity: Not Currently  Other Topics Concern  . Not on file  Social History Narrative   disabled   Social Determinants of Health   Financial Resource Strain: Not on file  Food Insecurity: Not on file  Transportation Needs: Not on file  Physical Activity: Not on file  Stress: Not on file   Social Connections: Not on file  Intimate Partner Violence: Not on file    Family History: Family History  Problem Relation Age of Onset  . Cancer Father   . Diabetes Other        grandparents  . Hypertension Mother   . CAD Other        No family history    Review of Systems: All other systems reviewed and are otherwise negative except as noted above.   Physical Exam: Vitals:   01/02/21 0958  BP: 100/68  Pulse: 68  SpO2: 99%  Weight: (!) 313 lb (142 kg)  Height: 5\' 5"  (1.651 m)     GEN- The patient is well appearing, alert and oriented x 3 today.   HEENT: normocephalic, atraumatic; sclera clear, conjunctiva pink; hearing intact; oropharynx clear; neck supple, no JVP Lymph- no cervical lymphadenopathy Lungs- Clear to ausculation bilaterally, normal work of breathing.  No wheezes, rales, rhonchi Heart- Regular rate and rhythm, no murmurs, rubs or gallops, PMI not laterally displaced GI- soft, non-tender, non-distended, bowel sounds present, no hepatosplenomegaly Extremities- no clubbing or cyanosis. No edema; DP/PT/radial pulses 2+ bilaterally MS- no significant deformity or atrophy Skin- warm and dry, no rash or lesion; ICD pocket well healed Psych- euthymic mood, full affect Neuro- strength and sensation are intact  ICD interrogation- reviewed in detail today,  See PACEART report  EKG:  EKG is not ordered today. The ekg ordered today shows AS-VP at 79 bpm, QTc OK when measured manually and corrected for QRS of 126 ms. (Closer to 460 ms QTc)  Recent Labs: 09/14/2020: BUN 51; Creatinine, Ser 1.83; Hemoglobin 13.2; Magnesium 2.6; Platelets 235; Potassium 3.7; Sodium 136   Wt Readings from Last 3 Encounters:  01/02/21 (!) 313 lb (142 kg)  09/14/20 (!) 309 lb 9.6 oz (140.4 kg)  08/12/20 (!) 315 lb (142.9 kg)     Other studies Reviewed: Additional studies/ records that were reviewed today include: Previous EP and HF notes   Assessment and Plan:  1.  Chronic  systolic dysfunction s/p Medtronic CRT-D  euvolemic today Stable on an appropriate medical regimen Normal ICD function See Pace Art report No changes today  2. VT Quiescent on sotalol Recent EKG and labs stable.  3. PAF On Xarelto for CHA2DS2VASC of at least 3.   Burden 0% on today's check.  Current medicines are reviewed at length with the patient today.   The patient does not have concerns regarding his medicines.  The following changes were made today:  none  Labs/ tests ordered today include:  No orders of the defined types were placed in this encounter.  Disposition:   Follow up with EP APP 3-4 months to keep on track with Sotalol labs and EKG q 6 months.    13/07/21, [name]  01/02/2021 10:27 AM  [facility] [address] [place] [id number] (office) [phone] (fax)

## 2021-01-11 ENCOUNTER — Ambulatory Visit (INDEPENDENT_AMBULATORY_CARE_PROVIDER_SITE_OTHER): Payer: Medicare Other

## 2021-01-11 DIAGNOSIS — I472 Ventricular tachycardia, unspecified: Secondary | ICD-10-CM

## 2021-01-11 LAB — CUP PACEART REMOTE DEVICE CHECK
Battery Remaining Longevity: 35 mo
Battery Voltage: 2.95 V
Brady Statistic AP VP Percent: 7.87 %
Brady Statistic AP VS Percent: 0.17 %
Brady Statistic AS VP Percent: 90.32 %
Brady Statistic AS VS Percent: 1.63 %
Brady Statistic RA Percent Paced: 8.03 %
Brady Statistic RV Percent Paced: 0.11 %
Date Time Interrogation Session: 20220408012205
HighPow Impedance: 85 Ohm
Implantable Lead Implant Date: 20090901
Implantable Lead Implant Date: 20170210
Implantable Lead Implant Date: 20170210
Implantable Lead Location: 753858
Implantable Lead Location: 753859
Implantable Lead Location: 753860
Implantable Lead Model: 4598
Implantable Lead Model: 5076
Implantable Lead Model: 6935
Implantable Pulse Generator Implant Date: 20170210
Lead Channel Impedance Value: 304 Ohm
Lead Channel Impedance Value: 361 Ohm
Lead Channel Impedance Value: 361 Ohm
Lead Channel Impedance Value: 418 Ohm
Lead Channel Impedance Value: 456 Ohm
Lead Channel Impedance Value: 475 Ohm
Lead Channel Impedance Value: 513 Ohm
Lead Channel Impedance Value: 608 Ohm
Lead Channel Impedance Value: 722 Ohm
Lead Channel Impedance Value: 779 Ohm
Lead Channel Impedance Value: 950 Ohm
Lead Channel Impedance Value: 988 Ohm
Lead Channel Impedance Value: 988 Ohm
Lead Channel Pacing Threshold Amplitude: 0.625 V
Lead Channel Pacing Threshold Amplitude: 0.875 V
Lead Channel Pacing Threshold Amplitude: 1.5 V
Lead Channel Pacing Threshold Pulse Width: 0.4 ms
Lead Channel Pacing Threshold Pulse Width: 0.4 ms
Lead Channel Pacing Threshold Pulse Width: 0.4 ms
Lead Channel Sensing Intrinsic Amplitude: 10.875 mV
Lead Channel Sensing Intrinsic Amplitude: 10.875 mV
Lead Channel Sensing Intrinsic Amplitude: 2.125 mV
Lead Channel Sensing Intrinsic Amplitude: 2.125 mV
Lead Channel Setting Pacing Amplitude: 1.5 V
Lead Channel Setting Pacing Amplitude: 2 V
Lead Channel Setting Pacing Amplitude: 2.5 V
Lead Channel Setting Pacing Pulse Width: 0.4 ms
Lead Channel Setting Pacing Pulse Width: 0.4 ms
Lead Channel Setting Sensing Sensitivity: 0.3 mV

## 2021-01-14 ENCOUNTER — Ambulatory Visit (INDEPENDENT_AMBULATORY_CARE_PROVIDER_SITE_OTHER): Payer: Medicare Other

## 2021-01-14 DIAGNOSIS — I5022 Chronic systolic (congestive) heart failure: Secondary | ICD-10-CM

## 2021-01-14 DIAGNOSIS — Z95810 Presence of automatic (implantable) cardiac defibrillator: Secondary | ICD-10-CM | POA: Diagnosis not present

## 2021-01-16 NOTE — Progress Notes (Signed)
EPIC Encounter for ICM Monitoring  Patient Name: Gary Hutchinson is a 60 y.o. male Date: 01/16/2021 Primary Care Physican: Creola Corn, MD Primary Cardiologist:[name] Electrophysiologist: Allred Bi-V Pacing: 97.9% 3/9/2022Weight:314lbs  Time in AT/AF 0.0 hr/day (0.0%)(taking Xarelto)  Spoke with patient and reports feeling well at this time.  Denies fluid symptoms.   Took a metolazone within the last week because he was feeling tired and thought he had some fluid.   Optivol thoracic impedancesuggestingnormalfluid levels.  Prescribed dosage:  Torsemide 20 mg 2 tablets (40 mg total)every morning and 1 tablet(20 mg total)every evening.  Metolazone 2.5 mg 1 tablet as needed.  Spironolactone 12.5 mg take 1 tablet daily  Labs: 09/14/2020 Creatinine 1.83, BUN 51, Potassium 3.7, Sodium 136, GFR 42 12/14/2019 Creatinine 1.73, BUN 62, Potassium 3.8, Sodium 136, GFR 43-49 05/24/2019 Creatinine 1.83, BUN 58, Potassium 3.5, Sodium 135, GFR 40-46 01/06/2020Creatinine 1.54, BUN40, Potassium 4.0, Sodium 133, GFR 49-57  Recommendations: Recommendation to limit salt intake to 2000 mg daily and fluid intake to 64 oz daily.  Encouraged to call if experiencing any fluid symptoms.   Follow-up plan: ICM clinic phone appointment on5/31/2022. 91 day device clinic remote transmission7/05/2021.   EP/Cardiology Office Visits: Recall for 01/12/2021 with Dr Gala Romney.  Recall 05/03/2021 with Otilio Saber, PA  Copy of ICM check sent to Dr.Allred  3 month ICM trend: 01/14/2021.    1 Year ICM trend:       Karie Soda, RN 01/16/2021 9:34 AM

## 2021-01-18 ENCOUNTER — Telehealth: Payer: Self-pay | Admitting: Licensed Clinical Social Worker

## 2021-01-18 NOTE — Telephone Encounter (Signed)
CSW consulted to speak with pt regarding current financial concerns.  Attempted to call but unable to reach or leave VM- sent text message informed pt who I was and requesting return call when available  Will continue to follow and assist as needed  Burna Sis, LCSW Clinical Social Worker [facility] Desk#: [phone] Cell#: [phone]

## 2021-01-18 NOTE — Telephone Encounter (Signed)
Entered in error

## 2021-01-24 NOTE — Progress Notes (Signed)
Remote ICD transmission.   

## 2021-02-13 ENCOUNTER — Other Ambulatory Visit: Payer: Self-pay | Admitting: Internal Medicine

## 2021-03-05 ENCOUNTER — Ambulatory Visit (INDEPENDENT_AMBULATORY_CARE_PROVIDER_SITE_OTHER): Payer: Medicare Other

## 2021-03-05 DIAGNOSIS — I5022 Chronic systolic (congestive) heart failure: Secondary | ICD-10-CM | POA: Diagnosis not present

## 2021-03-05 DIAGNOSIS — Z95810 Presence of automatic (implantable) cardiac defibrillator: Secondary | ICD-10-CM | POA: Diagnosis not present

## 2021-03-06 NOTE — Progress Notes (Signed)
EPIC Encounter for ICM Monitoring  Patient Name: Gary Hutchinson is a 60 y.o. male Date: 03/06/2021 Primary Care Physican: Creola Corn, MD Primary Cardiologist:[name] Electrophysiologist: Allred Bi-V Pacing: 97.9% 5/31/2022Weight:314lbs  Time in AT/AF 0.0 hr/day (0.0%)(taking Xarelto)  Spoke with patient and reports feeling well at this time.  Denies fluid symptoms.     Optivol thoracic impedancesuggestingnormalfluid levels.  Prescribed dosage:  Torsemide 20 mg 2 tablets (40 mg total)every morning and 1 tablet(20 mg total)every evening.  Metolazone 2.5 mg 1 tablet as needed.  Spironolactone 12.5 mg take 1 tablet daily  Labs: 09/14/2020 Creatinine 1.83, BUN 51, Potassium 3.7, Sodium 136, GFR 42 12/14/2019 Creatinine 1.73, BUN 62, Potassium 3.8, Sodium 136, GFR 43-49 05/24/2019 Creatinine 1.83, BUN 58, Potassium 3.5, Sodium 135, GFR 40-46 01/06/2020Creatinine 1.54, BUN40, Potassium 4.0, Sodium 133, GFR 49-57  Recommendations: Recommendation to limit salt intake to 2000 mg daily and fluid intake to 64 oz daily.  Encouraged to call if experiencing any fluid symptoms.   Follow-up plan: ICM clinic phone appointment on7/08/2021. 91 day device clinic remote transmission7/05/2021.   EP/Cardiology Office Visits: Recall for 01/12/2021 with Dr Gala Romney.  Recall 05/03/2021 with Otilio Saber, PA  Copy of ICM check sent to Dr.Allred  3 month ICM trend: 03/05/2021.    1 Year ICM trend:       Karie Soda, RN 03/06/2021 4:54 PM

## 2021-03-19 ENCOUNTER — Other Ambulatory Visit (HOSPITAL_COMMUNITY): Payer: Self-pay | Admitting: Internal Medicine

## 2021-03-21 ENCOUNTER — Other Ambulatory Visit (HOSPITAL_COMMUNITY): Payer: Self-pay | Admitting: *Deleted

## 2021-03-23 ENCOUNTER — Encounter (HOSPITAL_COMMUNITY): Payer: Self-pay

## 2021-03-23 ENCOUNTER — Emergency Department (HOSPITAL_COMMUNITY): Payer: Medicare Other

## 2021-03-23 ENCOUNTER — Other Ambulatory Visit: Payer: Self-pay

## 2021-03-23 ENCOUNTER — Emergency Department (HOSPITAL_COMMUNITY)
Admission: EM | Admit: 2021-03-23 | Discharge: 2021-03-23 | Disposition: A | Discharge status: Home / Self Care | Payer: Medicare Other | Attending: Emergency Medicine | Admitting: Emergency Medicine

## 2021-03-23 DIAGNOSIS — I130 Hypertensive heart and chronic kidney disease with heart failure and stage 1 through stage 4 chronic kidney disease, or unspecified chronic kidney disease: Secondary | ICD-10-CM | POA: Insufficient documentation

## 2021-03-23 DIAGNOSIS — Z7901 Long term (current) use of anticoagulants: Secondary | ICD-10-CM | POA: Diagnosis not present

## 2021-03-23 DIAGNOSIS — K55069 Acute infarction of intestine, part and extent unspecified: Secondary | ICD-10-CM | POA: Diagnosis not present

## 2021-03-23 DIAGNOSIS — R1084 Generalized abdominal pain: Secondary | ICD-10-CM

## 2021-03-23 DIAGNOSIS — R5383 Other fatigue: Secondary | ICD-10-CM | POA: Insufficient documentation

## 2021-03-23 DIAGNOSIS — R630 Anorexia: Secondary | ICD-10-CM | POA: Diagnosis not present

## 2021-03-23 DIAGNOSIS — Z79899 Other long term (current) drug therapy: Secondary | ICD-10-CM | POA: Diagnosis not present

## 2021-03-23 DIAGNOSIS — I5022 Chronic systolic (congestive) heart failure: Secondary | ICD-10-CM | POA: Insufficient documentation

## 2021-03-23 DIAGNOSIS — Z794 Long term (current) use of insulin: Secondary | ICD-10-CM | POA: Insufficient documentation

## 2021-03-23 DIAGNOSIS — R110 Nausea: Secondary | ICD-10-CM

## 2021-03-23 DIAGNOSIS — N189 Chronic kidney disease, unspecified: Secondary | ICD-10-CM | POA: Diagnosis not present

## 2021-03-23 DIAGNOSIS — E1122 Type 2 diabetes mellitus with diabetic chronic kidney disease: Secondary | ICD-10-CM | POA: Diagnosis not present

## 2021-03-23 DIAGNOSIS — I480 Paroxysmal atrial fibrillation: Secondary | ICD-10-CM | POA: Insufficient documentation

## 2021-03-23 DIAGNOSIS — Z7984 Long term (current) use of oral hypoglycemic drugs: Secondary | ICD-10-CM | POA: Insufficient documentation

## 2021-03-23 DIAGNOSIS — J45909 Unspecified asthma, uncomplicated: Secondary | ICD-10-CM | POA: Insufficient documentation

## 2021-03-23 DIAGNOSIS — K429 Umbilical hernia without obstruction or gangrene: Secondary | ICD-10-CM | POA: Insufficient documentation

## 2021-03-23 LAB — CBC WITH DIFFERENTIAL/PLATELET
Abs Immature Granulocytes: 0.03 10*3/uL (ref 0.00–0.07)
Basophils Absolute: 0.1 10*3/uL (ref 0.0–0.1)
Basophils Relative: 1 %
Eosinophils Absolute: 0.3 10*3/uL (ref 0.0–0.5)
Eosinophils Relative: 4 %
HCT: 43.8 % (ref 39.0–52.0)
Hemoglobin: 13.7 g/dL (ref 13.0–17.0)
Immature Granulocytes: 0 %
Lymphocytes Relative: 19 %
Lymphs Abs: 1.4 10*3/uL (ref 0.7–4.0)
MCH: 26.5 pg (ref 26.0–34.0)
MCHC: 31.3 g/dL (ref 30.0–36.0)
MCV: 84.7 fL (ref 80.0–100.0)
Monocytes Absolute: 0.6 10*3/uL (ref 0.1–1.0)
Monocytes Relative: 8 %
Neutro Abs: 5 10*3/uL (ref 1.7–7.7)
Neutrophils Relative %: 68 %
Platelets: 217 10*3/uL (ref 150–400)
RBC: 5.17 MIL/uL (ref 4.22–5.81)
RDW: 15.9 % — ABNORMAL HIGH (ref 11.5–15.5)
WBC: 7.4 10*3/uL (ref 4.0–10.5)
nRBC: 0 % (ref 0.0–0.2)

## 2021-03-23 LAB — COMPREHENSIVE METABOLIC PANEL
ALT: 19 U/L (ref 0–44)
AST: 21 U/L (ref 15–41)
Albumin: 3.3 g/dL — ABNORMAL LOW (ref 3.5–5.0)
Alkaline Phosphatase: 96 U/L (ref 38–126)
Anion gap: 11 (ref 5–15)
BUN: 30 mg/dL — ABNORMAL HIGH (ref 6–20)
CO2: 21 mmol/L — ABNORMAL LOW (ref 22–32)
Calcium: 8.8 mg/dL — ABNORMAL LOW (ref 8.9–10.3)
Chloride: 104 mmol/L (ref 98–111)
Creatinine, Ser: 1.74 mg/dL — ABNORMAL HIGH (ref 0.61–1.24)
GFR, Estimated: 44 mL/min — ABNORMAL LOW (ref >60)
Glucose, Bld: 199 mg/dL — ABNORMAL HIGH (ref 70–99)
Potassium: 4 mmol/L (ref 3.5–5.1)
Sodium: 136 mmol/L (ref 135–145)
Total Bilirubin: 0.8 mg/dL (ref 0.3–1.2)
Total Protein: 6.9 g/dL (ref 6.5–8.1)

## 2021-03-23 LAB — BRAIN NATRIURETIC PEPTIDE: B Natriuretic Peptide: 139.4 pg/mL — ABNORMAL HIGH (ref 0.0–100.0)

## 2021-03-23 MED ORDER — SODIUM CHLORIDE 0.9 % IV BOLUS
500.0000 mL | Freq: Once | INTRAVENOUS | Status: AC
  Administered 2021-03-23: 15:00:00 500 mL via INTRAVENOUS

## 2021-03-23 MED ORDER — ONDANSETRON HCL 4 MG/2ML IJ SOLN
4.0000 mg | Freq: Once | INTRAMUSCULAR | Status: AC
  Administered 2021-03-23: 15:00:00 4 mg via INTRAVENOUS
  Filled 2021-03-23: qty 2

## 2021-03-23 MED ORDER — ONDANSETRON 8 MG PO TBDP: 8.0000 mg | ORAL_TABLET | Freq: Three times a day (TID) | ORAL | 0 refills | Status: AC | PRN

## 2021-03-23 NOTE — ED Notes (Signed)
Crackers and ginger ale provided. 

## 2021-03-23 NOTE — ED Provider Notes (Signed)
Emergency Medicine Provider Triage Evaluation Note  Gary Hutchinson , a 60 y.o. male  was evaluated in triage.  Pt complains of SOB. Woke up Wednesday AM with upset stomach and nausea without vomiting. Wasn't able to take home meds that day. Started feeling better next day. Last night similar symptoms recurred. Feels like he is carrying fluid. NO CP. Mainly SOB and abdominal discomfort. Only missed xarelto on Wednesday.   Review of Systems  Positive: SOB, nausea, bloating Negative: CP  Physical Exam  BP 132/62 (BP Location: Left Arm)   Pulse (!) 58   Temp 98.4 F (36.9 C) (Oral)   Resp 20   SpO2 99%  Gen:   Awake, no distress   Resp:  Normal effort, lungs dimished b/l MSK:   Moves extremities without difficulty  Other:  Abd is soft, no LE edema  Medical Decision Making  Medically screening exam initiated at 7:43 AM.  Appropriate orders placed.  Gary Hutchinson was informed that the remainder of the evaluation will be completed by another provider, this initial triage assessment does not replace that evaluation, and the importance of remaining in the ED until their evaluation is complete.     Gary Hutchinson, [name], [name] 03/23/21 [id number]    Gary Sprout, MD 03/23/21 1100

## 2021-03-23 NOTE — ED Provider Notes (Signed)
[facility] EMERGENCY DEPARTMENT Provider Note   CSN: [id number] Arrival date & time: 03/23/21  1696     History No chief complaint on file.   Gary Hutchinson is a 60 y.o. male.  Gary Hutchinson has a history of congestive heart failure, AICD placement, atrial fibrillation, chronic kidney disease, diabetes, and pulmonary hypertension.  He states that 3 days ago, he had nausea and vomiting.  He says it felt like he ate something wrong, but he does not know anything he was exposed to.  Since that time he has had ongoing nausea and anorexia and has been unable to tolerate his medications.  He awoke at 3 AM today with diaphoresis and diffuse abdominal pain.  He does report that recent inflammation and increased price of food has affected his diet.  He states that it is not as healthy as he would like.  The history is provided by the patient.  Abdominal Pain Pain location:  Generalized Pain quality: cramping   Pain radiates to:  Does not radiate Pain severity:  Moderate Onset quality:  Sudden Duration: awoke with morning with diffuse pain and diaphoresis. Timing:  Constant Progression:  Unchanged Chronicity:  New Context comment:  Had nausea and vomiting onset 3 days ago Relieved by:  Nothing Worsened by:  Nothing Ineffective treatments: pedialyte. Associated symptoms: anorexia, fatigue, nausea and vomiting   Associated symptoms: no chest pain, no chills, no cough, no dysuria, no fever, no flatus, no hematuria, no shortness of breath and no sore throat       Past Medical History:  Diagnosis Date   AICD (automatic cardioverter/defibrillator) present    Medtronic   Alcohol abuse    now quit   Anemia    iron defi   Arthritis    "fingers, knees, some days all my joints ache" (11/16/2015)   Asthma    Cholecystitis    Chronic systolic congestive heart failure (HCC)    a. RHC (02/2011) RA 31/27, RV 71/27, PA67/45, PCWP 46, PA 49%, Fick CO: 3.4 b. ECHO (03/2014)  EF 30-35%, grade II DD, mild MR, RV poorly visualized appears mildly decreased c. RHC (05/2014) RA 13, RV 54/4/11, PA 60/22 (37), PCWP 17, Fick CO/CI: 4.4 / 1.9, Thermo CO/CI: 3.8 /1.6, PVR 5.2 WU, PA 57% and 59%   Diverticulosis of colon    Gout    Hemorrhoids, internal    Hyperlipidemia    Hypertension    Morbid obesity (HCC)    Nonischemic cardiomyopathy (HCC)    a. 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   CHF (congestive heart failure) (HCC) 11/16/2015   Chronic systolic heart failure (HCC) 10/25/2015   Atrial flutter (HCC)    LBBB (left bundle branch block) 10/06/2015   Cardiogenic shock (HCC) 03/08/2014   Atrial fibrillation (HCC) 03/06/2014   Ventricular tachycardia (HCC)    Automatic implantable cardioverter-defibrillator in situ 12/06/2009   Pulmonary HTN (HCC) 05/02/2008   HEMORRHOIDS, INTERNAL 05/02/2008   DIVERTICULOSIS OF COLON 05/02/2008   ALCOHOL ABUSE, HX OF 05/02/2008   CHOLECYSTITIS, UNSPECIFIED 04/10/2008   Insulin dependent type  2 diabetes mellitus, uncontrolled (HCC) 05/07/2007   GOUT 05/07/2007   Morbid obesity (HCC) 05/07/2007   ANEMIA-IRON DEFICIENCY 05/07/2007   Obstructive sleep apnea 05/07/2007   HTN (hypertension) 05/07/2007    Past Surgical History:  Procedure Laterality Date   CARDIAC CATHETERIZATION  03/08/2004   EF 25-30%   CARDIOVERSION N/A 10/17/2015   Procedure: CARDIOVERSION;  Surgeon: [name], MD;  Location: [facility] ENDOSCOPY;  Service: Cardiovascular;  Laterality: N/A;   EP IMPLANTABLE DEVICE N/A 11/16/2015   Procedure: BiVI Upgrade;  Surgeon: Hillis Range, MD;  Medtronic Viva Quad XT    INSERTION OF ICD  06/2008   ICD- Medtronic    RIGHT HEART CATHETERIZATION N/A 05/23/2014   Procedure: RIGHT HEART CATH;  Surgeon: [name], MD;  Location: [facility] CATH LAB;  Service: Cardiovascular;  Laterality: N/A;   TRANSTHORACIC ECHOCARDIOGRAM  05/27/2008   EF 30-35%   US ECHOCARDIOGRAPHY  08/22/2008   EF 30-35%       Family History  Problem Relation Age of Onset   Cancer Father    Diabetes Other        grandparents   Hypertension Mother    CAD Other        No family history    Social History   Tobacco Use   Smoking status: Never   Smokeless tobacco: Never  Vaping Use   Vaping Use: Never used  Substance Use Topics   Alcohol use: Not Currently    Comment: former heavy ETOH, quit in "the late '90s"   Drug use: No    Home Medications Prior to Admission medications   Medication Sig Start Date End Date Taking? Authorizing Provider  acetaminophen (TYLENOL) 500 MG tablet Take 500 mg by mouth every 6 (six) hours as needed for moderate pain or headache.     [provider]  albuterol (VENTOLIN HFA) 108 (90 Base) MCG/ACT inhaler Inhale 2 puffs into the lungs every 6 (six) hours as needed for wheezing or shortness of breath. Reported on 02/06/2016    [provider]  allopurinol (ZYLOPRIM) 100 MG tablet Take 100 mg by mouth daily. 12/28/20   [provider]  atorvastatin (LIPITOR) 80 MG tablet Take 80 mg by mouth daily.    [provider]  colchicine 0.6 MG tablet Take 0.6 mg by mouth daily as needed (for gout flares).     [provider]  dapagliflozin propanediol (FARXIGA) 10 MG TABS tablet Take 10 mg by mouth daily.    [provider]  digoxin (LANOXIN) 0.25 MG tablet TAKE 1/2 TABLET BY MOUTH EVERY OTHER DAY 03/20/21   [name], Bevelyn Buckles, MD  Fluticasone-Salmeterol (ADVAIR) 250-50 MCG/DOSE AEPB Inhale 1 puff into the lungs every 12 (twelve) hours as needed (for flares/shortness of breath). Reported on 02/06/2016    [provider]  hydrALAZINE (APRESOLINE) 25 MG tablet TAKE 1.5 TABLETS (37.5 MG TOTAL) BY MOUTH 3 (THREE) TIMES DAILY. 09/05/20 12/04/20  [name], Bevelyn Buckles, MD  insulin detemir (LEVEMIR) 100 UNIT/ML injection Inject 40 Units into the skin at bedtime.  03/13/14   Aundria Rud, CRNA  insulin lispro (HUMALOG) 100 UNIT/ML injection Inject 5 Units into the skin 3 (three) times daily before meals.    [provider]  isosorbide mononitrate (IMDUR) 30 MG 24 hr tablet TAKE 1 TABLET (30 MG TOTAL) BY MOUTH 2 TIMES DAILY. 11/14/20   [name], Bevelyn Buckles, MD  metolazone (ZAROXOLYN) 2.5 MG tablet Take 2.5 mg by mouth daily as needed (for edema).  Reported on 02/06/2016    [provider]  montelukast (SINGULAIR) 10 MG tablet Take by mouth as needed.    [provider]  sacubitril-valsartan (ENTRESTO) 97-103 MG Take 1 tablet by mouth 2 (two) times daily. 09/14/20   [name], Bevelyn Bucklesaniel R, MD  sotalol (BETAPACE) 80 MG tablet TAKE 1 TABLET (80 MG TOTAL) BY MOUTH 2 (TWO) TIMES DAILY. PLEASE KEEP UPCOMING APPOINTMENT FOR FURTHER REFILLS 02/13/21   Allred, Fayrene FearingJames, MD  spironolactone (ALDACTONE) 25 MG tablet Take 12.5 mg by mouth daily.  10/28/15   [provider]  torsemide (DEMADEX) 20 MG tablet TAKE 2 TABLETS (40 MG TOTAL) BY MOUTH EVERY MORNING AND 1 TABLET (20 MG TOTAL) AT BEDTIME. 05/07/20   [name], Bevelyn Bucklesaniel R, MD  traMADol (ULTRAM) 50 MG tablet as needed. 05/12/12   [provider]  XARELTO 20 MG TABS tablet TAKE 1 TABLET (20 MG TOTAL) BY MOUTH DAILY WITH SUPPER. 12/20/20   [name], Bevelyn Bucklesaniel R, MD    Allergies    Patient has no known allergies.  Review of Systems   Review of Systems  Constitutional:  Positive for fatigue. Negative for chills and fever.  HENT:  Negative for ear pain and sore throat.   Eyes:  Negative for pain and visual disturbance.  Respiratory:  Negative for cough and shortness of breath.   Cardiovascular:  Negative for chest pain and palpitations.   Gastrointestinal:  Positive for abdominal pain, anorexia, nausea and vomiting. Negative for flatus.  Genitourinary:  Negative for dysuria and hematuria.  Musculoskeletal:  Negative for arthralgias and back pain.  Skin:  Negative for color change and rash.  Neurological:  Negative for seizures and syncope.  All other systems reviewed and are negative.  Physical Exam Updated Vital Signs BP (!) 153/75 (BP Location: Right Arm)   Pulse (!) 55   Temp 98.6 F (37 C) (Oral)   Resp 15   SpO2 99%   Physical Exam Vitals and nursing note reviewed.  Constitutional:      Appearance: He is well-developed.  HENT:     Head: Normocephalic and atraumatic.  Eyes:     Conjunctiva/sclera: Conjunctivae normal.  Cardiovascular:     Heart sounds: No murmur heard. Pulmonary:     Effort: No respiratory distress.  Abdominal:     General: There is distension.     Palpations: Abdomen is soft.     Tenderness: There is no abdominal tenderness.  Musculoskeletal:     Cervical back: Neck supple.     Right lower leg: No edema.     Left lower leg: No edema.  Skin:    General: Skin is warm and dry.  Neurological:     General: No focal deficit present.     Mental Status: He is alert and oriented to person, place, and time.  Psychiatric:        Mood and Affect: Mood normal.        Behavior: Behavior normal.    ED Results / Procedures / Treatments   Labs (all labs ordered are listed, but only abnormal results are displayed) Labs Reviewed  COMPREHENSIVE METABOLIC PANEL - Abnormal; Notable for the following components:      Result Value   CO2 21 (*)    Glucose, Bld 199 (*)    BUN 30 (*)    Creatinine, Ser 1.74 (*)    Calcium 8.8 (*)    Albumin 3.3 (*)    GFR, Estimated 44 (*)    All other components within  normal limits  CBC WITH DIFFERENTIAL/PLATELET - Abnormal; Notable for the following components:   RDW 15.9 (*)    All other components within normal limits  BRAIN NATRIURETIC PEPTIDE -  Abnormal; Notable for the following components:   B Natriuretic Peptide 139.4 (*)    All other components within normal limits    EKG EKG Interpretation  Date/Time:  Saturday March 23 2021 07:36:33 EDT Ventricular Rate:  57 PR Interval:  160 QRS Duration: 120 QT Interval:  470 QTC Calculation: 457 R Axis:   -76 Text Interpretation: Atrial-sensed ventricular-paced rhythm with occasional AV dual-paced complexes Abnormal ECG No acute ischemia similar to prior Confirmed by Pieter Partridge ([id number]) on 03/23/2021 8:32:20 PM  Radiology CT Abdomen Pelvis Wo Contrast  Result Date: 03/23/2021 CLINICAL DATA:  Abdominal cramping and nausea. Shortness of breath. Bowel obstruction suspected. EXAM: CT ABDOMEN AND PELVIS WITHOUT CONTRAST TECHNIQUE: Multidetector CT imaging of the abdomen and pelvis was performed following the standard protocol without IV contrast. COMPARISON:  Most recent CT 10/14/2015 FINDINGS: Lower chest: Pacemaker wires partially included. Mild cardiomegaly. Linear atelectasis in the left greater than lower lobe. No pleural fluid or confluent consolidation. Hepatobiliary: Mild hepatic steatosis. No evidence of focal lesion on this noncontrast exam. Unremarkable gallbladder without calcified gallstone or pericholecystic fat stranding. No biliary dilatation. Pancreas: No ductal dilatation or inflammation. Spleen: Normal in size without focal abnormality. Adrenals/Urinary Tract: Mild adrenal thickening without dominant nodule. No hydronephrosis or renal calculi minimal chronic bilateral perinephric edema. No focal renal lesion on this noncontrast exam. Urinary bladder is minimally distended. No bladder wall thickening. Stomach/Bowel: Stomach is physiologically distended without abnormal gastric distension. Decompressed duodenum and small bowel. No small bowel obstruction. No small bowel inflammation. Normal appendix. Moderate volume of colonic stool. There is descending and sigmoid colonic  diverticulosis. No diverticulitis. No colonic inflammation. Vascular/Lymphatic: Mild aortic atherosclerosis. No aortic aneurysm. No portal venous or mesenteric gas. No enlarged lymph nodes in the abdomen or pelvis. Reproductive: Prostate is unremarkable. Other: Moderate-sized fat containing umbilical hernia. Small fat containing bilateral inguinal hernias. No ascites or free air. There is a well-circumscribed ovoid soft tissue nodule measuring 2.5 x 1.8 cm in the left anterior abdomen in the omental fat with a thin punctate calcification, series 3, image 59. This abuts adjacent small bowel and is at site of prior fat density lesion and presumed omental infarct. Musculoskeletal: Mild lower lumbar facet hypertrophy. There are no acute or suspicious osseous abnormalities. IMPRESSION: 1. No bowel obstruction or acute abnormality in the abdomen/pelvis. 2. A 2.5 x 1.8 cm ovoid soft tissue nodule in the left anterior abdomen in the omental fat with a thin punctate calcification is at site of prior fat density lesion and presumed omental infarct. Etiology is indeterminate, favored to be benign, however peritoneal nodule is not entirely excluded. Consider PET-CT for metabolic characterization. 3. Colonic diverticulosis without diverticulitis. 4. Mild hepatic steatosis. 5. Moderate-sized fat containing umbilical hernia. Small fat containing bilateral inguinal hernias. Aortic Atherosclerosis (ICD10-I70.0). Electronically Signed   By: Narda Rutherford M.D.   On: 03/23/2021 15:12   DG Chest 2 View  Result Date: 03/23/2021 CLINICAL DATA:  Shortness of breath. EXAM: CHEST - 2 VIEW COMPARISON:  09/09/2017 FINDINGS: Stable appearance of the ICD. Enlarged vascular and interstitial structures are similar to the previous examination. No focal lung consolidation. Heart size is upper limits of normal but stable. No large pleural effusions. IMPRESSION: Stable chest radiograph findings with prominent vascular and interstitial lung  markings. Findings could be related to  chronic vascular congestion. Stable appearance of the cardiac ICD. Electronically Signed   By: Richarda Overlie M.D.   On: 03/23/2021 08:34    Procedures Procedures   Medications Ordered in ED Medications  sodium chloride 0.9 % bolus 500 mL (has no administration in time range)  ondansetron (ZOFRAN) injection 4 mg (has no administration in time range)    ED Course  I have reviewed the triage vital signs and the nursing notes.  Pertinent labs & imaging results that were available during my care of the patient were reviewed by me and considered in my medical decision making (see chart for details).    MDM Rules/Calculators/A&P                          Drinda Butts Harper presents with ongoing symptoms after developing acute nausea and vomiting several days ago.  He states that he woke up with severe pain this morning.  He also has a history of congestive heart failure and has an AICD.  He wanted to make sure that his lab work was not suggestive of acute cardiovascular decompensation.  He was evaluated for evidence of dehydration or volume overload.  CT scan was negative for acute, surgical pathology but did reveal an omental infarct.  There is some calcification in this area that will require further follow-up to ensure there is no evidence of malignancy.  I have counseled the patient about this, and he is to follow-up with his doctor.  Otherwise, he was feeling symptomatically much better at discharge and was able to tolerate food. Final Clinical Impression(s) / ED Diagnoses Final diagnoses:  Nausea  Generalized abdominal pain  Umbilical hernia without obstruction and without gangrene  Omental infarction (HCC)  Chronic systolic congestive heart failure (HCC)    Rx / DC Orders ED Discharge Orders          Ordered    ondansetron (ZOFRAN-ODT) 8 MG disintegrating tablet  Every 8 hours PRN        03/23/21 2030             Koleen Distance, MD 03/23/21  2034

## 2021-03-23 NOTE — ED Notes (Signed)
Patient transported to CT 

## 2021-03-23 NOTE — ED Triage Notes (Signed)
Patient complains of abdominal cramping and nausea since Wednesday. Has missed 2 days of meds due to same. Denis pain but complains of SOB. Patient speaking complete sentences. Request kidneys checked, heart checked.

## 2021-04-10 ENCOUNTER — Ambulatory Visit: Payer: Medicare Other | Admitting: Internal Medicine

## 2021-04-12 ENCOUNTER — Ambulatory Visit (INDEPENDENT_AMBULATORY_CARE_PROVIDER_SITE_OTHER): Payer: Medicare Other

## 2021-04-12 DIAGNOSIS — I428 Other cardiomyopathies: Secondary | ICD-10-CM | POA: Diagnosis not present

## 2021-04-15 ENCOUNTER — Ambulatory Visit: Payer: Medicare Other

## 2021-04-15 DIAGNOSIS — I5022 Chronic systolic (congestive) heart failure: Secondary | ICD-10-CM

## 2021-04-15 DIAGNOSIS — Z95810 Presence of automatic (implantable) cardiac defibrillator: Secondary | ICD-10-CM

## 2021-04-15 LAB — CUP PACEART REMOTE DEVICE CHECK
Date Time Interrogation Session: 20220711063911
Implantable Lead Implant Date: 20090901
Implantable Lead Implant Date: 20170210
Implantable Lead Implant Date: 20170210
Implantable Lead Location: 753858
Implantable Lead Location: 753859
Implantable Lead Location: 753860
Implantable Lead Model: 4598
Implantable Lead Model: 5076
Implantable Lead Model: 6935
Implantable Pulse Generator Implant Date: 20170210

## 2021-04-16 ENCOUNTER — Other Ambulatory Visit: Payer: Self-pay | Admitting: Internal Medicine

## 2021-04-16 ENCOUNTER — Other Ambulatory Visit (HOSPITAL_COMMUNITY): Payer: Self-pay | Admitting: Internal Medicine

## 2021-04-16 DIAGNOSIS — R1904 Left lower quadrant abdominal swelling, mass and lump: Secondary | ICD-10-CM

## 2021-04-16 DIAGNOSIS — R935 Abnormal findings on diagnostic imaging of other abdominal regions, including retroperitoneum: Secondary | ICD-10-CM

## 2021-04-17 NOTE — Progress Notes (Signed)
EPIC Encounter for ICM Monitoring  Patient Name: Gary Hutchinson is a 60 y.o. male Date: 04/17/2021 Primary Care Physican: Creola Corn, MD Primary Cardiologist: [name] Electrophysiologist: Allred Bi-V Pacing:  97.3%        03/05/2021 Weight: 314 lbs      Time in AT/AF  0.0 hr/day (0.0%) (taking Xarelto)                               Spoke with patient and reports feeling well at this time.  Denies fluid symptoms.      Optivol thoracic impedance suggesting normal fluid levels.     Prescribed dosage:  Torsemide 20 mg Take 20 mg by mouth in the morning, at noon, and at bedtime. Metolazone 2.5 mg 1 tablet as needed. Spironolactone 12.5 mg take 1 tablet daily   Labs: 03/23/2021 Creatinine 1.74, BUN 30, Potassium 4.0, Sodium 136, GFR 44 A complete set of results can be found in Results Review.   Recommendations:  Recommendation to limit salt intake to 2000 mg daily and fluid intake to 64 oz daily.  Encouraged to call if experiencing any fluid symptoms.   Follow-up plan: ICM clinic phone appointment on 05/20/2021.   91 day device clinic remote transmission 07/12/2021.     EP/Cardiology Office Visits:  Recall for 01/12/2021 with Dr Gala Romney.  Recall 05/03/2021 with Otilio Saber, PA   Copy of ICM check sent to Dr. [name].   3 month ICM trend: 04/12/2021.    1 Year ICM trend:       Karie Soda, RN 04/17/2021 12:47 PM

## 2021-04-23 ENCOUNTER — Ambulatory Visit (HOSPITAL_COMMUNITY)
Admission: RE | Admit: 2021-04-23 | Discharge: 2021-04-23 | Disposition: A | Discharge status: Home / Self Care | Payer: Medicare Other | Source: Ambulatory Visit | Attending: Internal Medicine | Admitting: Internal Medicine

## 2021-04-23 ENCOUNTER — Other Ambulatory Visit: Payer: Self-pay

## 2021-04-23 DIAGNOSIS — R1904 Left lower quadrant abdominal swelling, mass and lump: Secondary | ICD-10-CM | POA: Insufficient documentation

## 2021-04-23 DIAGNOSIS — R935 Abnormal findings on diagnostic imaging of other abdominal regions, including retroperitoneum: Secondary | ICD-10-CM | POA: Insufficient documentation

## 2021-04-23 LAB — GLUCOSE, CAPILLARY: Glucose-Capillary: 211 mg/dL — ABNORMAL HIGH (ref 70–99)

## 2021-04-23 MED ORDER — FLUDEOXYGLUCOSE F - 18 (FDG) INJECTION
16.0000 | Freq: Once | INTRAVENOUS | Status: AC
  Administered 2021-04-23: 15.6 via INTRAVENOUS

## 2021-05-03 NOTE — Progress Notes (Signed)
Remote ICD transmission.   

## 2021-05-13 ENCOUNTER — Other Ambulatory Visit (HOSPITAL_COMMUNITY): Payer: Self-pay | Admitting: Internal Medicine

## 2021-05-22 NOTE — Progress Notes (Signed)
HPI M never smoker followed for OSA, complicated by  AFib, VTach/ AICD, HBP, Chronic Systolic CHF, Amiodarone induce hypothyroid, DM2, Morbid obesity, Gout, Diverticulosis NPSG 04/06/01 AHI 117/ hr, desaturation to 60%, body weight 324 lbs  ========================================================= 04/10/20- 59 yoM never smoker followed for OSA, complicated by  AFib, VTach/ AICD, HBP, Chronic Systolic CHF, Amiodarone induce hypothyroid, DM2, Morbid obesity, Gout, Diverticulosis BIPAP 17/13/  Adapt Download compliance 90%, AHI 1.1/ hr Body weight today 318 lbs.   Blames weight gain on Covid restrictions  Up 73 lbs in past year. Would like to try a little higher pressure.  We reviewed download.  Covid va- 2 Moderna  05/22/21- 60 yoM never smoker followed for OSA, complicated by  AFib, VTach/ AICD, HBP, Chronic Systolic CHF, Amiodarone induce hypothyroid, Pulm Hypertension, DM2, Morbid obesity, Gout, Diverticulosis, Omental Infarction,  BIPAP 17/13/  Adapt Download- didn't bring card Body weight today-323 lbs Covid vax-4 Moderna Comfortable with CPAP and says he is using nightly. Sleeps better with it. Discussed weight. Interested in referral to [facility]. CXR 03/23/21- IMPRESSION: Stable chest radiograph findings with prominent vascular and interstitial lung markings. Findings could be related to chronic vascular congestion. Stable appearance of the cardiac ICD.   ROS-see HPI   + = positive Constitutional:    weight loss, night sweats, fevers, chills, fatigue, lassitude. HEENT:    headaches, difficulty swallowing, tooth/dental problems, sore throat,       sneezing, itching, ear ache, nasal congestion, post nasal drip, snoring CV:    chest pain, orthopnea, PND, swelling in lower extremities, anasarca,                                   dizziness, palpitations Resp:   shortness of breath with exertion or at rest.                productive cough,   non-productive cough,  coughing up of blood.              change in color of mucus.  wheezing.   Skin:    rash or lesions. GI:  No-   heartburn, indigestion, abdominal pain, nausea, vomiting, diarrhea,                 change in bowel habits, loss of appetite GU: dysuria, change in color of urine, no urgency or frequency.   flank pain. MS:   joint pain, stiffness, decreased range of motion, back pain. Neuro-     nothing unusual Psych:  change in mood or affect.  depression or anxiety.   memory loss.  OBJ- Physical Exam General- Alert, Oriented, Affect-appropriate, Distress- none acute, + morbidly obese Skin- rash-none, lesions- none, excoriation- none Lymphadenopathy- none Head- atraumatic            Eyes- Gross vision intact, PERRLA, conjunctivae and secretions clear            Ears- Hearing, canals-normal            Nose- Clear, no-Septal dev, mucus, polyps, erosion, perforation             Throat-+ upper plate, Mallampati III , mucosa clear , drainage- none, tonsils- atrophic Neck- flexible , trachea midline, no stridor , thyroid nl, carotid no bruit Chest - symmetrical excursion , unlabored           Heart/CV- RRR , no murmur , no gallop  , no rub,  nl s1 s2                           - JVD- none , edema- none, stasis changes- none, varices- none           Lung- clear to P&A, wheeze- none, cough- none , dullness-none, rub- none           Chest wall- + AICD Abd-  Br/ Gen/ Rectal- Not done, not indicated Extrem- cyanosis- none, clubbing, none, atrophy- none, strength- nl Neuro- grossly intact to observation

## 2021-05-23 ENCOUNTER — Other Ambulatory Visit: Payer: Self-pay

## 2021-05-23 ENCOUNTER — Encounter: Payer: Self-pay | Admitting: Internal Medicine

## 2021-05-23 ENCOUNTER — Telehealth: Payer: Self-pay

## 2021-05-23 ENCOUNTER — Ambulatory Visit (INDEPENDENT_AMBULATORY_CARE_PROVIDER_SITE_OTHER): Payer: Medicare Other | Admitting: Internal Medicine

## 2021-05-23 DIAGNOSIS — G4733 Obstructive sleep apnea (adult) (pediatric): Secondary | ICD-10-CM | POA: Diagnosis not present

## 2021-05-23 NOTE — Patient Instructions (Signed)
We can continue BIPAP 17/13  Order- referral to [facility]   dx morbid obesity  Please call if we can help

## 2021-05-23 NOTE — Telephone Encounter (Signed)
I spoke with the patient to get him to send missed HF transmission. He tried to send the transmission but it was unsuccessful. He received the error code [id number]. The patient will be receiving a new handheld in 7-10 business days. I told him once he received the receiver to let it charge for an hour then send the transmission.

## 2021-05-24 NOTE — Progress Notes (Signed)
No ICM remote transmission received for 05/20/2021 and next ICM transmission scheduled for 06/07/2021.  Pt waiting on new reader replacement for home monitor.

## 2021-05-27 ENCOUNTER — Encounter: Payer: Self-pay | Admitting: Internal Medicine

## 2021-05-27 NOTE — Assessment & Plan Note (Signed)
Benefits from BIPAP. Emphasized compliance goals and medical benefits. Plan- continue BIPAP 17/13

## 2021-05-27 NOTE — Assessment & Plan Note (Signed)
Encouraged him to work with [facility] seeking better control of body weight.

## 2021-05-28 ENCOUNTER — Ambulatory Visit (INDEPENDENT_AMBULATORY_CARE_PROVIDER_SITE_OTHER): Payer: Medicare Other

## 2021-05-28 DIAGNOSIS — Z95810 Presence of automatic (implantable) cardiac defibrillator: Secondary | ICD-10-CM

## 2021-05-28 DIAGNOSIS — I5022 Chronic systolic (congestive) heart failure: Secondary | ICD-10-CM

## 2021-05-31 NOTE — Progress Notes (Signed)
EPIC Encounter for ICM Monitoring  Patient Name: Gary Hutchinson is a 60 y.o. male Date: 05/31/2021 Primary Care Physican: Creola Corn, MD Primary Cardiologist: [name] Electrophysiologist: Allred Bi-V Pacing:  97.6%        05/31/2021 Weight: 318 lbs      Time in AT/AF  0.0 hr/day (0.0%) (taking Xarelto)                               Spoke with patient and reports feeling well at this time.  Denies fluid symptoms.      Optivol thoracic impedance suggesting normal fluid levels.     Prescribed dosage:  Torsemide 20 mg Take 20 mg by mouth in the morning, at noon, and at bedtime. Metolazone 2.5 mg 1 tablet as needed. Spironolactone 12.5 mg take 1 tablet daily   Labs: 03/23/2021 Creatinine 1.74, BUN 30, Potassium 4.0, Sodium 136, GFR 44 A complete set of results can be found in Results Review.   Recommendations:  Recommendation to limit salt intake to 2000 mg daily and fluid intake to 64 oz daily.  Encouraged to call if experiencing any fluid symptoms.   Follow-up plan: ICM clinic phone appointment on 07/08/2021.   91 day device clinic remote transmission 07/12/2021.     EP/Cardiology Office Visits:  Recall for 01/12/2021 with Dr Gala Romney.  Recall 05/03/2021 with Otilio Saber, PA   Copy of ICM check sent to Dr. [name].    3 month ICM trend: 05/27/2021.    1 Year ICM trend:       Karie Soda, RN 05/31/2021 4:30 PM

## 2021-07-08 ENCOUNTER — Ambulatory Visit (INDEPENDENT_AMBULATORY_CARE_PROVIDER_SITE_OTHER): Payer: Medicare Other

## 2021-07-08 DIAGNOSIS — Z95810 Presence of automatic (implantable) cardiac defibrillator: Secondary | ICD-10-CM

## 2021-07-08 DIAGNOSIS — I5022 Chronic systolic (congestive) heart failure: Secondary | ICD-10-CM | POA: Diagnosis not present

## 2021-07-12 ENCOUNTER — Ambulatory Visit (INDEPENDENT_AMBULATORY_CARE_PROVIDER_SITE_OTHER): Payer: Medicare Other

## 2021-07-12 DIAGNOSIS — I428 Other cardiomyopathies: Secondary | ICD-10-CM

## 2021-07-12 NOTE — Progress Notes (Signed)
EPIC Encounter for ICM Monitoring  Patient Name: Gary Hutchinson is a 60 y.o. male Date: 07/12/2021 Primary Care Physican: Creola Corn, MD Primary Cardiologist: [name] Electrophysiologist: Allred Bi-V Pacing:  97.5%        05/31/2021 Weight: 318 lbs      Time in AT/AF  0.0 hr/day (0.0%) (taking Xarelto)                               Spoke with patient and reports feeling well at this time.  Denies fluid symptoms.      Optivol thoracic impedance suggesting normal fluid levels.     Prescribed dosage:  Torsemide 20 mg Take 20 mg by mouth in the morning, at noon, and at bedtime. Metolazone 2.5 mg 1 tablet as needed. Spironolactone 12.5 mg take 1 tablet daily   Labs: 03/23/2021 Creatinine 1.74, BUN 30, Potassium 4.0, Sodium 136, GFR 44 A complete set of results can be found in Results Review.   Recommendations:  Recommendation to limit salt intake to 2000 mg daily and fluid intake to 64 oz daily.  Encouraged to call if experiencing any fluid symptoms.   Follow-up plan: ICM clinic phone appointment on 08/12/2021.   91 day device clinic remote transmission 10/11/2021.     EP/Cardiology Office Visits:  09/03/2021 with HF Clinic.  Recall 05/03/2021 with Otilio Saber, PA   Copy of ICM check sent to Dr. [name].    3 month ICM trend: 07/08/2021.    1 Year ICM trend:       Karie Soda, RN 07/12/2021 1:02 PM

## 2021-07-16 LAB — CUP PACEART REMOTE DEVICE CHECK
Battery Remaining Longevity: 30 mo
Battery Voltage: 2.94 V
Brady Statistic AP VP Percent: 10.36 %
Brady Statistic AP VS Percent: 0.24 %
Brady Statistic AS VP Percent: 87.57 %
Brady Statistic AS VS Percent: 1.83 %
Brady Statistic RA Percent Paced: 10.59 %
Brady Statistic RV Percent Paced: 0.52 %
Date Time Interrogation Session: 20221007023324
HighPow Impedance: 87 Ohm
Implantable Lead Implant Date: 20090901
Implantable Lead Implant Date: 20170210
Implantable Lead Implant Date: 20170210
Implantable Lead Location: 753858
Implantable Lead Location: 753859
Implantable Lead Location: 753860
Implantable Lead Model: 4598
Implantable Lead Model: 5076
Implantable Lead Model: 6935
Implantable Pulse Generator Implant Date: 20170210
Lead Channel Impedance Value: 1007 Ohm
Lead Channel Impedance Value: 1026 Ohm
Lead Channel Impedance Value: 1064 Ohm
Lead Channel Impedance Value: 304 Ohm
Lead Channel Impedance Value: 399 Ohm
Lead Channel Impedance Value: 399 Ohm
Lead Channel Impedance Value: 456 Ohm
Lead Channel Impedance Value: 475 Ohm
Lead Channel Impedance Value: 513 Ohm
Lead Channel Impedance Value: 551 Ohm
Lead Channel Impedance Value: 665 Ohm
Lead Channel Impedance Value: 817 Ohm
Lead Channel Impedance Value: 874 Ohm
Lead Channel Pacing Threshold Amplitude: 0.625 V
Lead Channel Pacing Threshold Amplitude: 0.75 V
Lead Channel Pacing Threshold Amplitude: 1.375 V
Lead Channel Pacing Threshold Pulse Width: 0.4 ms
Lead Channel Pacing Threshold Pulse Width: 0.4 ms
Lead Channel Pacing Threshold Pulse Width: 0.4 ms
Lead Channel Sensing Intrinsic Amplitude: 1.875 mV
Lead Channel Sensing Intrinsic Amplitude: 1.875 mV
Lead Channel Sensing Intrinsic Amplitude: 12 mV
Lead Channel Sensing Intrinsic Amplitude: 12 mV
Lead Channel Setting Pacing Amplitude: 1.5 V
Lead Channel Setting Pacing Amplitude: 2 V
Lead Channel Setting Pacing Amplitude: 2.5 V
Lead Channel Setting Pacing Pulse Width: 0.4 ms
Lead Channel Setting Pacing Pulse Width: 0.4 ms
Lead Channel Setting Sensing Sensitivity: 0.3 mV

## 2021-07-22 NOTE — Progress Notes (Signed)
Remote ICD transmission.   

## 2021-08-12 ENCOUNTER — Ambulatory Visit (INDEPENDENT_AMBULATORY_CARE_PROVIDER_SITE_OTHER): Payer: Medicare Other

## 2021-08-12 DIAGNOSIS — I5022 Chronic systolic (congestive) heart failure: Secondary | ICD-10-CM | POA: Diagnosis not present

## 2021-08-12 DIAGNOSIS — Z95810 Presence of automatic (implantable) cardiac defibrillator: Secondary | ICD-10-CM | POA: Diagnosis not present

## 2021-08-16 NOTE — Progress Notes (Signed)
EPIC Encounter for ICM Monitoring  Patient Name: Gary Hutchinson is a 60 y.o. male Date: 08/16/2021 Primary Care Physican: Creola Corn, MD Primary Cardiologist: [name] Electrophysiologist: Allred Bi-V Pacing:  97.1%        08/16/2021 Weight: 319 lbs      Time in AT/AF  0.0 hr/day (0.0%) (taking Xarelto)                               Spoke with patient and reports feeling well at this time.  Denies fluid symptoms.   He reports having a GI virus last week which correlates with impedance suggesting dryness     Optivol thoracic impedance suggesting normal fluid levels.     Prescribed dosage:  Torsemide 20 mg Take 20 mg by mouth in the morning, at noon, and at bedtime. Metolazone 2.5 mg 1 tablet as needed. Spironolactone 12.5 mg take 1 tablet daily   Labs: 03/23/2021 Creatinine 1.74, BUN 30, Potassium 4.0, Sodium 136, GFR 44 A complete set of results can be found in Results Review.   Recommendations:   Encouraged to call if experiencing any fluid symptoms.   Follow-up plan: ICM clinic phone appointment on 09/16/2021.   91 day device clinic remote transmission 10/11/2021.     EP/Cardiology Office Visits:  09/03/2021 with HF Clinic.  Recall 05/03/2021 with Otilio Saber, PA   Copy of ICM check sent to Dr. [name].    3 month ICM trend: 08/12/2021.    1 Year ICM trend:       Karie Soda, RN 08/16/2021 11:22 AM

## 2021-08-27 NOTE — Progress Notes (Signed)
Patient ID: Gary Hutchinson, male   DOB: [birth year], 60 y.o.   MRN: [id number]    Advanced Heart Failure Clinic Note   PCP: Shon Baton EP: Thompson Grayer HF Cardiology: Dr. Haroldine Laws  HPI: Gary Hutchinson is a 60 y.o. male with a history of chronic systolic heart failure, ICM s/p ICD, HTN, DM II, OSA on CPAP, history of ventricular tachycardia status post AICD placement in 2009, atrial fibrillation and morbid obesity.    Admitted 6/15 for A/C HF and cardiogenic shock (co-ox 42%). Diuresed with IV lasix and milrinone which were weaned off.  He went into Afib with RVR and chemically cardioverted back to NSR with IV amiodarone.   [place] 8/15): Left sided filling pressures well compensated, mild pulm HTN with elevated right-sided pressures and mod/severely depressed CO. Discussion about trial of milrinone but patient wanted to hold off.  Admitted 1/17 with volume overload, cellulitis and AF. Diuresed with IV lasix and required short term milrinone. Loaded on amio and had successful DC-CV. Discharge weight was 307 pounds.   Admitted 2/18 with Influenza A/B. 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 Seen by EP and managed conservatively.  S/p successful DCCV 07/29/2017.  Today he returns for HF follow up. Last seen 12/21 and doing well, OptiVol stable, stable NYHA II symptoms. Overall feeling fine. Walking daily and has no SOB with this, does not do stairs due to right knee OA. Denies CP, dizziness, edema, or PND/Orthopnea. Appetite ok. No fever or chills. Weight at home 318 pounds. Taking all medications. Wears CPAP nightly. Takes metolazone PRN, but not very often. Got a new TM and planning on getting more active. Working with PCP on blood sugars. sBP at home 110/60-70s.  Cardiac Studies:   05/03/12: EF 25-30%.  Grade 1 diastolic dysfunction.  Mild MR.  Mod dilated LA.   07/27/13: EF 40%, diff HK, MR mild, LA mild/mod dilated 03/2014: EF30-35%, grade II DD, mild MR, RV systolic fx  mildly decreased 10/2014: EF 40%.  10/2015: EF ~40%. RV mildly dilated.  8/17 EF 25-35% 07/27/2017 EF  25- 30% 3/21: EF ~30% RV ok. Personally reviewed  SH: Lives with son in [place]. Disabled. No ETOH or tobacco abuse  FH; Mother living: HT        Father deceased: was not part of his life so not sure health issues  Review of systems complete and found to be negative unless listed in HPI.   Past Medical History:  Diagnosis Date   AICD (automatic cardioverter/defibrillator) present    Medtronic   Alcohol abuse    now quit   Anemia    iron defi   Arthritis    "fingers, knees, some days all my joints ache" (11/16/2015)   Asthma    Cholecystitis    Chronic systolic congestive heart failure ([place])    a. RHC (02/2011) RA 31/27, RV 71/27, PA67/45, PCWP 46, PA 49%, Fick CO: 3.4 b. ECHO (03/2014) EF 30-35%, grade II DD, mild MR, RV poorly visualized appears mildly decreased c. RHC (05/2014) RA 13, RV 54/4/11, PA 60/22 (37), PCWP 17, Fick CO/CI: 4.4 / 1.9, Thermo CO/CI: 3.8 /1.6, PVR 5.2 WU, PA 57% and 59%   Diverticulosis of colon    Gout    Hemorrhoids, internal    Hyperlipidemia    Hypertension    Morbid obesity ([place])    Nonischemic cardiomyopathy ([place])    a. LHC (02/2011) normal coronaries   OSA on CPAP    Paroxysmal  atrial fibrillation (HCC)    Pneumonia 2000s X 1   Pulmonary hypertension (HCC)    Renal insufficiency    Type II diabetes mellitus (HCC)    Ventricular tachycardia    s/p MDT ICD implant   Current Outpatient Medications  Medication Sig Dispense Refill   acetaminophen (TYLENOL) 500 MG tablet Take 500 mg by mouth every 6 (six) hours as needed for moderate pain or headache.      albuterol (VENTOLIN HFA) 108 (90 Base) MCG/ACT inhaler Inhale 2 puffs into the lungs every 6 (six) hours as needed for wheezing or shortness of breath.     allopurinol (ZYLOPRIM) 100 MG tablet Take 100 mg by mouth daily.     atorvastatin (LIPITOR) 80 MG tablet Take 80 mg by mouth at bedtime.      colchicine 0.6 MG tablet Take 0.6 mg by mouth daily as needed (for gout flares).      dapagliflozin propanediol (FARXIGA) 10 MG TABS tablet Take 10 mg by mouth daily.     digoxin (LANOXIN) 0.25 MG tablet TAKE 1/2 TABLET BY MOUTH EVERY OTHER DAY 45 tablet 3   Fluticasone-Salmeterol (ADVAIR) 250-50 MCG/DOSE AEPB Inhale 1 puff into the lungs every 12 (twelve) hours as needed (for flares/shortness of breath). Reported on 02/06/2016     hydrALAZINE (APRESOLINE) 25 MG tablet Take 25 mg by mouth 3 (three) times daily.     insulin detemir (LEVEMIR) 100 UNIT/ML injection Inject 40 Units into the skin at bedtime.      insulin lispro (HUMALOG) 100 UNIT/ML injection Inject 10 Units into the skin See admin instructions. 4 to 5 times daily with a meal, depending on how many meals are eaten that day.     isosorbide mononitrate (IMDUR) 30 MG 24 hr tablet TAKE 1 TABLET (30 MG TOTAL) BY MOUTH 2 TIMES DAILY. 180 tablet 3   metolazone (ZAROXOLYN) 2.5 MG tablet Take 2.5 mg by mouth daily as needed (for edema).     montelukast (SINGULAIR) 10 MG tablet Take 10 mg by mouth as needed (allergies).     ondansetron (ZOFRAN-ODT) 8 MG disintegrating tablet Take 1 tablet (8 mg total) by mouth every 8 (eight) hours as needed for nausea or vomiting. 20 tablet 0   sacubitril-valsartan (ENTRESTO) 97-103 MG Take 1 tablet by mouth 2 (two) times daily. 60 tablet 11   sotalol (BETAPACE) 80 MG tablet TAKE 1 TABLET (80 MG TOTAL) BY MOUTH 2 (TWO) TIMES DAILY. PLEASE KEEP UPCOMING APPOINTMENT FOR FURTHER REFILLS 180 tablet 3   spironolactone (ALDACTONE) 25 MG tablet Take 12.5 mg by mouth daily.      torsemide (DEMADEX) 20 MG tablet TAKE 2 TABLETS (40 MG TOTAL) BY MOUTH EVERY MORNING AND 1 TABLET (20 MG TOTAL) AT BEDTIME. 270 tablet 3   XARELTO 20 MG TABS tablet TAKE 1 TABLET (20 MG TOTAL) BY MOUTH DAILY WITH SUPPER. (Patient taking differently: Patient takes at lunch time.) 90 tablet 3   No current facility-administered medications for  this encounter.   BP (!) 106/58   Pulse 79   Wt (!) 145.2 kg (320 lb)   SpO2 99%   BMI 51.65 kg/m   Wt Readings from Last 3 Encounters:  09/03/21 (!) 145.2 kg (320 lb)  05/23/21 (!) 146.7 kg (323 lb 6.4 oz)  01/02/21 (!) 142 kg (313 lb)    General:  NAD. No resp difficulty HEENT: Normal Neck: Supple. Thick neck, JVP difficult. Carotids 2+ bilat; no bruits. No lymphadenopathy or thryomegaly appreciated. Cor: PMI nondisplaced.  Regular rate & rhythm. No rubs, gallops or murmurs. Lungs: Clear, diminished in bases Abdomen: Obese, nontender, nondistended. No hepatosplenomegaly. No bruits or masses. Good bowel sounds. Extremities: No cyanosis, clubbing, rash, edema Neuro: Alert & oriented x 3, cranial nerves grossly intact. Moves all 4 extremities w/o difficulty. Affect pleasant.  ECG: SR/V-paced 65 bpm, qtc 484 ms (Personally reviewed)  Device Interrogation: OptiVol impedence well below threshold, thoracic impedence near baseline, no AT/AF, no VT, patient activity ~1.5-2 hrs/day (personally reviewed).  ASSESSMENT & PLAN:  1) Chronic systolic HF: NICM s/p BiV Medtronic - Echo 07/2017 LVEF 25-30%.  - Echo 12/14/19 EF 30% - NYHA II. Volume status stable on exam and Optivol. - Continue torsemide 40 mg/20 mg.  - Continue Entresto 97/103 mg bid. - Continue spiro 12.5 mg daily. - Off bisoprolol in 2018. On sotalol 80 mg bid.  - Continue digoxin 0.0625 mg every other day.  - Continue hydralazine 25 mg tid + Imdur 30 mg bid. - Continue Farxiga 10 mg daily. No GU symptoms. - Update echo - Check BMET & dig level today.   2) CKD stage III  - Followed previously by nephrology. - Creatinine 1.5-1.9 - Check BMET today.    3) HTN  - Stable today. BP at home ~ 110/60-70s. - Continue current medications.  4) OSA  - Continue Bipap.  - Compliant.   5) PAF/AFL - S/P multiple DCCVs. Most recent 07/29/17.  - Had episode of AFL with ICD shock in 2/18 at time of influenza - Pt refused  ablation consideration.  - Continue Xarelto 20 mg daily. Denies bleeding.  - Continue Sotalol per EP.  - ECG in NSR/V-paced today  - CBC and Mag today.  6) Obesity   - Body mass index is 51.65 kg/m.  - Needs weight loss. - May benefit from semaglutide, will defer to PCP.  7) Amiodarone thyroxicity   - Off Amiodarone. Completed Methimazole treatment per PCP.   - No change.  8) DM2 - Continue insulin + Farxiga. - Followed by PCP. - Last A1C 8+  Follow with with Dr. Haroldine Laws in 6 months with echo.  [place], [place]  09/03/2021 9:28 AM

## 2021-09-03 ENCOUNTER — Ambulatory Visit (HOSPITAL_COMMUNITY)
Admission: RE | Admit: 2021-09-03 | Discharge: 2021-09-03 | Disposition: A | Discharge status: Home / Self Care | Payer: Medicare Other | Source: Ambulatory Visit | Attending: Family Medicine | Admitting: Family Medicine

## 2021-09-03 ENCOUNTER — Encounter (HOSPITAL_COMMUNITY): Payer: Self-pay

## 2021-09-03 ENCOUNTER — Telehealth (HOSPITAL_COMMUNITY): Payer: Self-pay

## 2021-09-03 ENCOUNTER — Other Ambulatory Visit: Payer: Self-pay

## 2021-09-03 VITALS — BP 106/58 | HR 79 | Wt 320.0 lb

## 2021-09-03 DIAGNOSIS — E032 Hypothyroidism due to medicaments and other exogenous substances: Secondary | ICD-10-CM | POA: Insufficient documentation

## 2021-09-03 DIAGNOSIS — N183 Chronic kidney disease, stage 3 unspecified: Secondary | ICD-10-CM | POA: Diagnosis not present

## 2021-09-03 DIAGNOSIS — Z6841 Body Mass Index (BMI) 40.0 and over, adult: Secondary | ICD-10-CM | POA: Insufficient documentation

## 2021-09-03 DIAGNOSIS — G4733 Obstructive sleep apnea (adult) (pediatric): Secondary | ICD-10-CM | POA: Diagnosis not present

## 2021-09-03 DIAGNOSIS — T462X5D Adverse effect of other antidysrhythmic drugs, subsequent encounter: Secondary | ICD-10-CM | POA: Insufficient documentation

## 2021-09-03 DIAGNOSIS — I130 Hypertensive heart and chronic kidney disease with heart failure and stage 1 through stage 4 chronic kidney disease, or unspecified chronic kidney disease: Secondary | ICD-10-CM | POA: Insufficient documentation

## 2021-09-03 DIAGNOSIS — E1122 Type 2 diabetes mellitus with diabetic chronic kidney disease: Secondary | ICD-10-CM | POA: Diagnosis not present

## 2021-09-03 DIAGNOSIS — Z7901 Long term (current) use of anticoagulants: Secondary | ICD-10-CM | POA: Insufficient documentation

## 2021-09-03 DIAGNOSIS — N1832 Chronic kidney disease, stage 3b: Secondary | ICD-10-CM

## 2021-09-03 DIAGNOSIS — Z95810 Presence of automatic (implantable) cardiac defibrillator: Secondary | ICD-10-CM | POA: Diagnosis not present

## 2021-09-03 DIAGNOSIS — I5022 Chronic systolic (congestive) heart failure: Secondary | ICD-10-CM

## 2021-09-03 DIAGNOSIS — I428 Other cardiomyopathies: Secondary | ICD-10-CM | POA: Diagnosis not present

## 2021-09-03 DIAGNOSIS — Z794 Long term (current) use of insulin: Secondary | ICD-10-CM

## 2021-09-03 DIAGNOSIS — I10 Essential (primary) hypertension: Secondary | ICD-10-CM | POA: Diagnosis not present

## 2021-09-03 DIAGNOSIS — I272 Pulmonary hypertension, unspecified: Secondary | ICD-10-CM | POA: Diagnosis not present

## 2021-09-03 DIAGNOSIS — Z7984 Long term (current) use of oral hypoglycemic drugs: Secondary | ICD-10-CM | POA: Diagnosis not present

## 2021-09-03 DIAGNOSIS — I480 Paroxysmal atrial fibrillation: Secondary | ICD-10-CM | POA: Diagnosis not present

## 2021-09-03 DIAGNOSIS — Z79899 Other long term (current) drug therapy: Secondary | ICD-10-CM | POA: Diagnosis not present

## 2021-09-03 LAB — BASIC METABOLIC PANEL
Anion gap: 9 (ref 5–15)
BUN: 37 mg/dL — ABNORMAL HIGH (ref 6–20)
CO2: 27 mmol/L (ref 22–32)
Calcium: 9.2 mg/dL (ref 8.9–10.3)
Chloride: 101 mmol/L (ref 98–111)
Creatinine, Ser: 1.94 mg/dL — ABNORMAL HIGH (ref 0.61–1.24)
GFR, Estimated: 39 mL/min — ABNORMAL LOW (ref >60)
Glucose, Bld: 200 mg/dL — ABNORMAL HIGH (ref 70–99)
Potassium: 3.4 mmol/L — ABNORMAL LOW (ref 3.5–5.1)
Sodium: 137 mmol/L (ref 135–145)

## 2021-09-03 LAB — MAGNESIUM: Magnesium: 2.6 mg/dL — ABNORMAL HIGH (ref 1.7–2.4)

## 2021-09-03 LAB — CBC
HCT: 43.4 % (ref 39.0–52.0)
Hemoglobin: 13.2 g/dL (ref 13.0–17.0)
MCH: 26 pg (ref 26.0–34.0)
MCHC: 30.4 g/dL (ref 30.0–36.0)
MCV: 85.4 fL (ref 80.0–100.0)
Platelets: 192 10*3/uL (ref 150–400)
RBC: 5.08 MIL/uL (ref 4.22–5.81)
RDW: 15.4 % (ref 11.5–15.5)
WBC: 8.6 10*3/uL (ref 4.0–10.5)
nRBC: 0 % (ref 0.0–0.2)

## 2021-09-03 LAB — DIGOXIN LEVEL: Digoxin Level: 0.3 ng/mL — ABNORMAL LOW (ref 0.8–2.0)

## 2021-09-03 NOTE — Patient Instructions (Signed)
It was great to see you today! No medication changes are needed at this time.  Labs today We will only contact you if something comes back abnormal or we need to make some changes. Otherwise no news is good news!  Your physician recommends that you schedule a follow-up appointment in: 6 months with Dr Gala Romney and echo  Your physician has requested that you have an echocardiogram. Echocardiography is a painless test that uses sound waves to create images of your heart. It provides your doctor with information about the size and shape of your heart and how well your heart's chambers and valves are working. This procedure takes approximately one hour. There are no restrictions for this procedure.  Do the following things EVERYDAY: Weigh yourself in the morning before breakfast. Write it down and keep it in a log. Take your medicines as prescribed Eat low salt foods--Limit salt (sodium) to 2000 mg per day.  Stay as active as you can everyday Limit all fluids for the day to less than 2 liters  At the [facility], you and your health needs are our priority. As part of our continuing mission to provide you with exceptional heart care, we have created designated Provider Care Teams. These Care Teams include your primary Cardiologist (physician) and Advanced Practice Providers (APPs- Physician Assistants and Nurse Practitioners) who all work together to provide you with the care you need, when you need it.   You may see any of the following providers on your designated Care Team at your next follow up: Dr Arvilla Meres Dr Carron Curie, NP Robbie Lis, [place] [facility] [place], [place] Karle Plumber, PharmD   Please be sure to bring in all your medications bottles to every appointment.   If you have any questions or concerns before your next appointment please send [place] a message through [place] or call our office at [phone].    TO LEAVE A  MESSAGE FOR THE NURSE SELECT OPTION 2, PLEASE LEAVE A MESSAGE INCLUDING: YOUR NAME DATE OF BIRTH CALL BACK NUMBER REASON FOR CALL**this is important as we prioritize the call backs  YOU WILL RECEIVE A CALL BACK THE SAME DAY AS LONG AS YOU CALL BEFORE 4:00 PM

## 2021-09-03 NOTE — Telephone Encounter (Signed)
-----   Message from Jacklynn Ganong, [place] sent at 09/03/2021  1:20 PM EST ----- K is low. Please increase K-rich foods in diet and repeat BMET in 2 weeks.

## 2021-09-03 NOTE — Telephone Encounter (Signed)
Patient advised and verbalized understanding,lab appointment scheduled,lab orders entered  Orders Placed This Encounter  Procedures   Basic metabolic panel    Standing Status:   Future    Standing Expiration Date:   09/03/2022    Order Specific Question:   Release to patient    Answer:   Immediate

## 2021-09-13 ENCOUNTER — Encounter: Payer: Medicare Other | Admitting: Student

## 2021-09-16 ENCOUNTER — Ambulatory Visit (INDEPENDENT_AMBULATORY_CARE_PROVIDER_SITE_OTHER): Payer: Medicare Other

## 2021-09-16 DIAGNOSIS — Z95810 Presence of automatic (implantable) cardiac defibrillator: Secondary | ICD-10-CM

## 2021-09-16 DIAGNOSIS — I5022 Chronic systolic (congestive) heart failure: Secondary | ICD-10-CM

## 2021-09-17 NOTE — Progress Notes (Signed)
EPIC Encounter for ICM Monitoring  Patient Name: Gary Hutchinson is a 60 y.o. male Date: 09/17/2021 Primary Care Physican: Creola Corn, MD Primary Cardiologist: [name] Electrophysiologist: Allred Bi-V Pacing:  97.1%        08/16/2021 Weight: 319 lbs      Time in AT/AF  0.0 hr/day (0.0%) (taking Xarelto)                               Spoke with patient and heart failure questions reviewed.  Pt feeling okay and no specific fluid symptoms.  Discussed updating DPR form at 12/20 OV appointment.    Optivol thoracic impedance suggesting possible fluid accumulation starting 09/08/2021.     Prescribed dosage:  Torsemide 20 mg Take 20 mg by mouth in the morning, at noon, and at bedtime. Metolazone 2.5 mg 1 tablet as needed. Spironolactone 12.5 mg take 1 tablet daily   Labs:  12/19 BMET scheduled 09/03/2021 Creatinine 1.94, BUN 37, Potassium 3.4, Sodium 137, GFR 39 03/23/2021 Creatinine 1.74, BUN 30, Potassium 4.0, Sodium 136, GFR 44 A complete set of results can be found in Results Review.   Recommendations:  Pt took Metolazone today for fluid and will recheck fluid levels 12/16.  Advised to eat foods high in potassium for the next several days.  He has been eating high potassium foods since 11/30 as instructed by HF clinic.     Follow-up plan: ICM clinic phone appointment on 09/20/2021.   91 day device clinic remote transmission 10/11/2021.     EP/Cardiology Office Visits:  09/24/2021 with Otilio Saber, PA   Copy of ICM check sent to Dr. [name].  Last HF clinic visit was 11/29 with Mikki Santee and copy sent to her for review.     3 month ICM trend: 09/16/2021.    12-14 Month ICM trend:       Karie Soda, RN 09/17/2021 10:04 AM

## 2021-09-18 NOTE — Progress Notes (Signed)
Received: Today [place], Anderson [place], FNP  Acheron Sugg, Josephine Igo, RN Thank you Jacki Cones, will wait for report after metolazone dose

## 2021-09-19 ENCOUNTER — Other Ambulatory Visit (HOSPITAL_COMMUNITY): Payer: Medicare Other

## 2021-09-20 ENCOUNTER — Ambulatory Visit (INDEPENDENT_AMBULATORY_CARE_PROVIDER_SITE_OTHER): Payer: Medicare Other

## 2021-09-20 DIAGNOSIS — I5022 Chronic systolic (congestive) heart failure: Secondary | ICD-10-CM

## 2021-09-20 DIAGNOSIS — Z95810 Presence of automatic (implantable) cardiac defibrillator: Secondary | ICD-10-CM

## 2021-09-20 NOTE — Progress Notes (Signed)
[place], Anderson Malta, FNP  Malachi [place], Josephine Igo, RN Thank you!

## 2021-09-20 NOTE — Progress Notes (Signed)
EPIC Encounter for ICM Monitoring  Patient Name: Gary Hutchinson is a 60 y.o. male Date: 09/20/2021 Primary Care Physican: Creola Corn, MD Primary Cardiologist: [name] Electrophysiologist: Allred Bi-V Pacing:  97.4%        08/16/2021 Weight: 319 lbs      Time in AT/AF  0.0 hr/day (0.0%) (taking Xarelto)                               Spoke with patient and heart failure questions reviewed.  Pt feeling well today.  Discussed updating DPR form at 12/20 OV appointment.    Optivol thoracic impedance suggesting fluid levels returned to normal after taking Metolazone.     Prescribed dosage:  Torsemide 20 mg Take 20 mg by mouth in the morning, at noon, and at bedtime. Metolazone 2.5 mg 1 tablet as needed. Spironolactone 12.5 mg take 1 tablet daily   Labs:  12/19 BMET scheduled 09/03/2021 Creatinine 1.94, BUN 37, Potassium 3.4, Sodium 137, GFR 39 03/23/2021 Creatinine 1.74, BUN 30, Potassium 4.0, Sodium 136, GFR 44 A complete set of results can be found in Results Review.   Recommendations:  No changes and encouraged to call if experiencing any fluid symptoms.   Follow-up plan: ICM clinic phone appointment on 10/21/2021.   91 day device clinic remote transmission 10/11/2021.     EP/Cardiology Office Visits:  09/24/2021 with Otilio Saber, PA   Copy of ICM check sent to Dr. [name].  Sent to [employer] as fyi.     3 month ICM trend: 09/20/2021.    12-14 Month ICM trend:       Karie Soda, RN 09/20/2021 10:52 AM

## 2021-09-23 ENCOUNTER — Ambulatory Visit (HOSPITAL_COMMUNITY)
Admission: RE | Admit: 2021-09-23 | Discharge: 2021-09-23 | Disposition: A | Discharge status: Home / Self Care | Payer: Medicare Other | Source: Ambulatory Visit | Attending: Cardiology | Admitting: Cardiology

## 2021-09-23 ENCOUNTER — Other Ambulatory Visit: Payer: Self-pay

## 2021-09-23 DIAGNOSIS — I5022 Chronic systolic (congestive) heart failure: Secondary | ICD-10-CM | POA: Diagnosis not present

## 2021-09-23 LAB — BASIC METABOLIC PANEL
Anion gap: 8 (ref 5–15)
BUN: 36 mg/dL — ABNORMAL HIGH (ref 6–20)
CO2: 27 mmol/L (ref 22–32)
Calcium: 8.7 mg/dL — ABNORMAL LOW (ref 8.9–10.3)
Chloride: 103 mmol/L (ref 98–111)
Creatinine, Ser: 1.53 mg/dL — ABNORMAL HIGH (ref 0.61–1.24)
GFR, Estimated: 52 mL/min — ABNORMAL LOW (ref >60)
Glucose, Bld: 142 mg/dL — ABNORMAL HIGH (ref 70–99)
Potassium: 4 mmol/L (ref 3.5–5.1)
Sodium: 138 mmol/L (ref 135–145)

## 2021-09-23 NOTE — Progress Notes (Signed)
Electrophysiology Office Note Date: 09/24/2021  ID:  MYKELL RAWL, DOB Oct 18, 1960, MRN [id number]  PCP: Creola Corn, MD Primary Cardiologist: None Electrophysiologist: Hillis Range, MD   CC: Routine ICD follow-up  Gary Hutchinson is a 60 y.o. male seen today for Hillis Range, MD for routine electrophysiology followup.    Since last being seen in clinic he reports doing well. He denies symptoms of palpitations, chest pain, shortness of breath, orthopnea, PND, lower extremity edema, claudication, dizziness, presyncope, syncope, bleeding, or neurologic sequela. The patient is tolerating medications without difficulties.     Device History: Medtronic BiV ICD implanted 11/2015 for CHF + inappropriate shock for rapid Aflutter (atypical) in the setting of illness w/flu Dec 2018, inappropriate shock for 1:1 AFlutter   AAD:  amiodarone developed thyrotoxicity Dec 2018 started on Sotalol  Past Medical History:  Diagnosis Date   AICD (automatic cardioverter/defibrillator) present    Medtronic   Alcohol abuse    now quit   Anemia    iron defi   Arthritis    "fingers, knees, some days all my joints ache" (11/16/2015)   Asthma    Cholecystitis    Chronic systolic congestive heart failure (HCC)    a. RHC (02/2011) RA 31/27, RV 71/27, PA67/45, PCWP 46, PA 49%, Fick CO: 3.4 b. ECHO (03/2014) EF 30-35%, grade II DD, mild MR, RV poorly visualized appears mildly decreased c. RHC (05/2014) RA 13, RV 54/4/11, PA 60/22 (37), PCWP 17, Fick CO/CI: 4.4 / 1.9, Thermo CO/CI: 3.8 /1.6, PVR 5.2 WU, PA 57% and 59%   Diverticulosis of colon    Gout    Hemorrhoids, internal    Hyperlipidemia    Hypertension    Morbid obesity (HCC)    Nonischemic cardiomyopathy (HCC)    a. LHC (02/2011) normal coronaries   OSA on CPAP    Paroxysmal atrial fibrillation (HCC)    Pneumonia 2000s X 1   Pulmonary hypertension (HCC)    Renal insufficiency    Type II diabetes mellitus (HCC)    Ventricular tachycardia     s/p MDT ICD implant   Past Surgical History:  Procedure Laterality Date   CARDIAC CATHETERIZATION  03/08/2004   EF 25-30%   CARDIOVERSION N/A 10/17/2015   Procedure: CARDIOVERSION;  Surgeon: [name], MD;  Location: [facility] ENDOSCOPY;  Service: Cardiovascular;  Laterality: N/A;   EP IMPLANTABLE DEVICE N/A 11/16/2015   Procedure: BiVI Upgrade;  Surgeon: Hillis Range, MD;  Medtronic Viva Quad XT   INSERTION OF ICD  06/2008   ICD- Medtronic    RIGHT HEART CATHETERIZATION N/A 05/23/2014   Procedure: RIGHT HEART CATH;  Surgeon: [name], MD;  [facility] C[facility] LAB;  Service: Cardiovascular;  Laterality: N/A;   TRANSTHORACIC ECHOCARDIOGRAM  05/27/2008   EF 30-35%   US ECHOCARDIOGRAPHY  08/22/2008   EF 30-35%    Current Outpatient Medications  Medication Sig Dispense Refill   acetaminophen (TYLENOL) 500 MG tablet Take 500 mg by mouth every 6 (six) hours as needed for moderate pain or headache.      albuterol (VENTOLIN HFA) 108 (90 Base) MCG/ACT inhaler Inhale 2 puffs into the lungs every 6 (six) hours as needed for wheezing or shortness of breath.     allopurinol (ZYLOPRIM) 100 MG tablet Take 100 mg by mouth daily.     atorvastatin (LIPITOR) 80 MG tablet Take 80 mg by mouth at bedtime.     colchicine 0.6 MG tablet Take 0.6 mg by mouth daily as  needed (for gout flares).      dapagliflozin propanediol (FARXIGA) 10 MG TABS tablet Take 10 mg by mouth daily.     digoxin (LANOXIN) 0.25 MG tablet TAKE 1/2 TABLET BY MOUTH EVERY OTHER DAY 45 tablet 3   Fluticasone-Salmeterol (ADVAIR) 250-50 MCG/DOSE AEPB Inhale 1 puff into the lungs every 12 (twelve) hours as needed (for flares/shortness of breath). Reported on 02/06/2016     hydrALAZINE (APRESOLINE) 25 MG tablet Take 25 mg by mouth 3 (three) times daily.     insulin detemir (LEVEMIR) 100 UNIT/ML injection Inject 40 Units into the skin at bedtime.      insulin lispro (HUMALOG) 100 UNIT/ML injection Inject 10 Units into the skin See admin  instructions. 4 to 5 times daily with a meal, depending on how many meals are eaten that day.     isosorbide mononitrate (IMDUR) 30 MG 24 hr tablet TAKE 1 TABLET (30 MG TOTAL) BY MOUTH 2 TIMES DAILY. 180 tablet 3   metolazone (ZAROXOLYN) 2.5 MG tablet Take 2.5 mg by mouth daily as needed (for edema).     montelukast (SINGULAIR) 10 MG tablet Take 10 mg by mouth as needed (allergies).     ondansetron (ZOFRAN-ODT) 8 MG disintegrating tablet Take 1 tablet (8 mg total) by mouth every 8 (eight) hours as needed for nausea or vomiting. 20 tablet 0   sacubitril-valsartan (ENTRESTO) 97-103 MG Take 1 tablet by mouth 2 (two) times daily. 60 tablet 11   sotalol (BETAPACE) 80 MG tablet TAKE 1 TABLET (80 MG TOTAL) BY MOUTH 2 (TWO) TIMES DAILY. PLEASE KEEP UPCOMING APPOINTMENT FOR FURTHER REFILLS 180 tablet 3   spironolactone (ALDACTONE) 25 MG tablet Take 12.5 mg by mouth daily.      torsemide (DEMADEX) 20 MG tablet TAKE 2 TABLETS (40 MG TOTAL) BY MOUTH EVERY MORNING AND 1 TABLET (20 MG TOTAL) AT BEDTIME. 270 tablet 3   XARELTO 20 MG TABS tablet TAKE 1 TABLET (20 MG TOTAL) BY MOUTH DAILY WITH SUPPER. (Patient taking differently: Patient takes at lunch time.) 90 tablet 3   No current facility-administered medications for this visit.    Allergies:   Patient has no known allergies.   Social History: Social History   Socioeconomic History   Marital status: Single    Spouse name: Not on file   Number of children: 4   Years of education: Not on file   Highest education level: Not on file  Occupational History    Employer: DISABLED  Tobacco Use   Smoking status: Never   Smokeless tobacco: Never  Vaping Use   Vaping Use: Never used  Substance and Sexual Activity   Alcohol use: Not Currently    Comment: former heavy ETOH, quit in "the late '90s"   Drug use: No   Sexual activity: Not Currently  Other Topics Concern   Not on file  Social History Narrative   disabled   Social Determinants of Health    Financial Resource Strain: Not on file  Food Insecurity: Not on file  Transportation Needs: Not on file  Physical Activity: Not on file  Stress: Not on file  Social Connections: Not on file  Intimate Partner Violence: Not on file    Family History: Family History  Problem Relation Age of Onset   Cancer Father    Diabetes Other        grandparents   Hypertension Mother    CAD Other        No family history  Review of Systems: Review of systems complete and found to be negative unless listed in HPI.     Physical Exam: Vitals:   09/24/21 0813  BP: 114/62  Pulse: 72  SpO2: 98%  Weight: (!) 329 lb (149.2 kg)  Height: 5\' 5"  (1.651 m)     General:  Well appearing. No resp difficulty. HEENT: Normal Neck: Supple. JVP 5-6. Carotids 2+ bilat; no bruits. No thyromegaly or nodule noted. Cor: PMI nondisplaced. RRR, No M/G/R noted Lungs: CTAB, normal effort. Abdomen: Soft, non-tender, non-distended, no HSM. No bruits or masses. +BS   Extremities: No cyanosis, clubbing, or rash. R and LLE no edema.  Neuro: Alert & orientedx3, cranial nerves grossly intact. moves all 4 extremities w/o difficulty. Affect pleasant   ICD interrogation- reviewed in detail today,  See PACEART report  EKG:  EKG is not ordered today. EKG ordered 09/03/2021 shows NSR at 65 bpm with stable QTc   Recent Labs: 03/23/2021: ALT 19; B Natriuretic Peptide 139.4 09/03/2021: Hemoglobin 13.2; Magnesium 2.6; Platelets 192 09/23/2021: BUN 36; Creatinine, Ser 1.53; Potassium 4.0; Sodium 138   Wt Readings from Last 3 Encounters:  09/24/21 (!) 329 lb (149.2 kg)  09/03/21 (!) 320 lb (145.2 kg)  05/23/21 (!) 323 lb 6.4 oz (146.7 kg)     Other studies Reviewed: Additional studies/ records that were reviewed today include: Previous EP and HF notes   Assessment and Plan:  1.  Chronic systolic dysfunction s/p Medtronic CRT-D  Volume status stable on exam Stable on an appropriate medical regimen, followed by  HF clinic Normal ICD function See Pace Art report No changes today.  2. VT Quiescent on sotalol Recent EKG shows stable QTc   3. PAF On Xarelto for CHA2DS2VASC of at least 3.    Burden <1%% on today's check.  Current medicines are reviewed at length with the patient today.    Labs/ tests ordered today include:  No orders of the defined types were placed in this encounter.  Disposition: RTC 6 months, sooner with symptoms. Likely transition to care with Dr. Curt Bears [facility])  Signed, Shirley Friar, [place]  09/24/2021 9:09 AM  [address] [place] [address] [phone] (office) [phone] (fax)

## 2021-09-24 ENCOUNTER — Encounter: Payer: Self-pay | Admitting: Student

## 2021-09-24 ENCOUNTER — Ambulatory Visit (INDEPENDENT_AMBULATORY_CARE_PROVIDER_SITE_OTHER): Payer: Medicare Other | Admitting: Student

## 2021-09-24 VITALS — BP 114/62 | HR 72 | Ht 65.0 in | Wt 329.0 lb

## 2021-09-24 DIAGNOSIS — I428 Other cardiomyopathies: Secondary | ICD-10-CM

## 2021-09-24 DIAGNOSIS — Z95810 Presence of automatic (implantable) cardiac defibrillator: Secondary | ICD-10-CM | POA: Diagnosis not present

## 2021-09-24 DIAGNOSIS — I5022 Chronic systolic (congestive) heart failure: Secondary | ICD-10-CM

## 2021-09-24 DIAGNOSIS — I10 Essential (primary) hypertension: Secondary | ICD-10-CM | POA: Diagnosis not present

## 2021-09-24 LAB — CUP PACEART INCLINIC DEVICE CHECK
Battery Remaining Longevity: 30 mo
Battery Voltage: 2.91 V
Brady Statistic AP VP Percent: 4.55 %
Brady Statistic AP VS Percent: 0.11 %
Brady Statistic AS VP Percent: 93.12 %
Brady Statistic AS VS Percent: 2.23 %
Brady Statistic RA Percent Paced: 4.65 %
Brady Statistic RV Percent Paced: 1.11 %
Date Time Interrogation Session: 20221220090923
HighPow Impedance: 72 Ohm
Implantable Lead Implant Date: 20090901
Implantable Lead Implant Date: 20170210
Implantable Lead Implant Date: 20170210
Implantable Lead Location: 753858
Implantable Lead Location: 753859
Implantable Lead Location: 753860
Implantable Lead Model: 4598
Implantable Lead Model: 5076
Implantable Lead Model: 6935
Implantable Pulse Generator Implant Date: 20170210
Lead Channel Impedance Value: 1007 Ohm
Lead Channel Impedance Value: 1007 Ohm
Lead Channel Impedance Value: 342 Ohm
Lead Channel Impedance Value: 399 Ohm
Lead Channel Impedance Value: 399 Ohm
Lead Channel Impedance Value: 399 Ohm
Lead Channel Impedance Value: 456 Ohm
Lead Channel Impedance Value: 475 Ohm
Lead Channel Impedance Value: 513 Ohm
Lead Channel Impedance Value: 646 Ohm
Lead Channel Impedance Value: 722 Ohm
Lead Channel Impedance Value: 760 Ohm
Lead Channel Impedance Value: 950 Ohm
Lead Channel Pacing Threshold Amplitude: 0.625 V
Lead Channel Pacing Threshold Amplitude: 0.875 V
Lead Channel Pacing Threshold Amplitude: 1.5 V
Lead Channel Pacing Threshold Pulse Width: 0.4 ms
Lead Channel Pacing Threshold Pulse Width: 0.4 ms
Lead Channel Pacing Threshold Pulse Width: 0.4 ms
Lead Channel Sensing Intrinsic Amplitude: 1.375 mV
Lead Channel Sensing Intrinsic Amplitude: 10.75 mV
Lead Channel Sensing Intrinsic Amplitude: 2.375 mV
Lead Channel Sensing Intrinsic Amplitude: 8.75 mV
Lead Channel Setting Pacing Amplitude: 1.5 V
Lead Channel Setting Pacing Amplitude: 2 V
Lead Channel Setting Pacing Amplitude: 2.5 V
Lead Channel Setting Pacing Pulse Width: 0.4 ms
Lead Channel Setting Pacing Pulse Width: 0.4 ms
Lead Channel Setting Sensing Sensitivity: 0.3 mV

## 2021-09-24 NOTE — Patient Instructions (Signed)

## 2021-09-25 ENCOUNTER — Other Ambulatory Visit (HOSPITAL_COMMUNITY): Payer: Medicare Other

## 2021-10-11 ENCOUNTER — Telehealth: Payer: Self-pay | Admitting: Student

## 2021-10-11 NOTE — Telephone Encounter (Signed)
Spoke with patient informed him of the automatic process of scheduling device transmission no matter when patient is seen in the office, patient voiced understanding and stated that he had already sent the transmission in.

## 2021-10-11 NOTE — Telephone Encounter (Signed)
°  1. Has your device fired? no  2. Is you device beeping? no  3. Are you experiencing draining or swelling at device site? no  4. Are you calling to see if we received your device transmission? no  5. Have you passed out? No   Patient states he received a call requesting he send a transmission today. He states he was in the office a few weeks ago and wants to know why he needs to send another transmission so soon.    Please route to Device Clinic Pool

## 2021-10-22 ENCOUNTER — Other Ambulatory Visit: Payer: Self-pay | Admitting: Internal Medicine

## 2021-10-23 NOTE — Progress Notes (Signed)
No ICM remote transmission received for 10/21/2021 and next ICM transmission scheduled for 11/05/2021.   °

## 2021-11-05 ENCOUNTER — Telehealth: Payer: Self-pay

## 2021-11-05 ENCOUNTER — Ambulatory Visit (INDEPENDENT_AMBULATORY_CARE_PROVIDER_SITE_OTHER): Payer: No Typology Code available for payment source

## 2021-11-05 DIAGNOSIS — I5022 Chronic systolic (congestive) heart failure: Secondary | ICD-10-CM | POA: Diagnosis not present

## 2021-11-05 DIAGNOSIS — Z95810 Presence of automatic (implantable) cardiac defibrillator: Secondary | ICD-10-CM | POA: Diagnosis not present

## 2021-11-05 NOTE — Telephone Encounter (Signed)
Spoke with patient and requested to send remote transmission to check fluid levels.

## 2021-11-06 ENCOUNTER — Ambulatory Visit (INDEPENDENT_AMBULATORY_CARE_PROVIDER_SITE_OTHER): Payer: Medicare Other

## 2021-11-06 DIAGNOSIS — I428 Other cardiomyopathies: Secondary | ICD-10-CM

## 2021-11-06 LAB — CUP PACEART REMOTE DEVICE CHECK
Date Time Interrogation Session: 20230201073214
Implantable Lead Implant Date: 20090901
Implantable Lead Implant Date: 20170210
Implantable Lead Implant Date: 20170210
Implantable Lead Location: 753858
Implantable Lead Location: 753859
Implantable Lead Location: 753860
Implantable Lead Model: 4598
Implantable Lead Model: 5076
Implantable Lead Model: 6935
Implantable Pulse Generator Implant Date: 20170210

## 2021-11-06 NOTE — Progress Notes (Signed)
EPIC Encounter for ICM Monitoring  Patient Name: Gary Hutchinson is a 61 y.o. male Date: 11/06/2021 Primary Care Physican: Creola Corn, MD Primary Cardiologist: [name] Electrophysiologist: Allred Bi-V Pacing:  96.5%        11/06/2021 Weight: 324 lbs      Time in AT/AF  0.0 hr/day (0.0%) (taking Xarelto)                               Spoke with patient and heart failure questions reviewed.  Pt feeling okay but feeling a little nauseated.  Has not had any fluid symptoms during decreased impedance but did take a Metolazone about a week ago which may correlate with fluid returning to normal.   Optivol thoracic impedance normal since 1/27 but was suggesting possible fluid accumulation starting 12/27-1/26.   Prescribed dosage:  Torsemide 20 mg Take 20 mg by mouth in the morning, at noon, and at bedtime. Metolazone 2.5 mg 1 tablet as needed. Spironolactone 12.5 mg take 1 tablet daily   Labs:   09/23/2021 Creatinine 1.53, BUN 36, Potassium 4.0, Sodium 138, GFR 52 09/03/2021 Creatinine 1.94, BUN 37, Potassium 3.4, Sodium 137, GFR 39 03/23/2021 Creatinine 1.74, BUN 30, Potassium 4.0, Sodium 136, GFR 44 A complete set of results can be found in Results Review.   Recommendations:  No changes and encouraged to call if experiencing any fluid symptoms.   Follow-up plan: ICM clinic phone appointment on 12/09/2021.   91 day device clinic remote transmission 02/05/2022.     EP/Cardiology Office Visits:  Recall 03/03/2022 with Dr Gala Romney.  Recall 03/23/2022 with Otilio Saber, PA   Copy of ICM check sent to Dr. [name].   3 month ICM trend: 11/05/2021.    12-14 Month ICM trend:     Karie Soda, RN 11/06/2021 1:56 PM

## 2021-11-13 NOTE — Progress Notes (Signed)
Remote ICD transmission.   

## 2021-11-29 ENCOUNTER — Other Ambulatory Visit (HOSPITAL_COMMUNITY): Payer: Self-pay | Admitting: Internal Medicine

## 2021-12-09 ENCOUNTER — Ambulatory Visit (INDEPENDENT_AMBULATORY_CARE_PROVIDER_SITE_OTHER): Payer: Medicare Other

## 2021-12-09 DIAGNOSIS — Z95810 Presence of automatic (implantable) cardiac defibrillator: Secondary | ICD-10-CM | POA: Diagnosis not present

## 2021-12-09 DIAGNOSIS — I5022 Chronic systolic (congestive) heart failure: Secondary | ICD-10-CM | POA: Diagnosis not present

## 2021-12-12 ENCOUNTER — Telehealth: Payer: Self-pay

## 2021-12-12 NOTE — Telephone Encounter (Signed)
I spoke with the patient and he agreed to send a transmission today. ?

## 2021-12-13 NOTE — Progress Notes (Signed)
EPIC Encounter for ICM Monitoring ? ?Patient Name: MELESIO MADARA is a 61 y.o. male ?Date: 12/13/2021 ?Primary Care Physican: Creola Corn, MD ?Primary Cardiologist: [name] ?Electrophysiologist: Allred ?Bi-V Pacing:  97.1%        ?12/13/2021 Weight: 321 lbs    ?  ?Time in AT/AF  0.0 hr/day (0.0%) (taking Xarelto) ?                              ?Spoke with patient and heart failure questions reviewed.  Pt asymptomatic for fluid accumulation.  Reports feeling well at this time and voices no complaints.  Taking Metolazone as needed.  ?  ?Optivol thoracic impedance suggesting normal fluid levels. ?  ?Prescribed dosage:  ?Torsemide 20 mg Take 20 mg by mouth in the morning, at noon, and at bedtime. ?Metolazone 2.5 mg 1 tablet as needed. ?Spironolactone 12.5 mg take 1 tablet daily ?  ?Labs:   ?09/23/2021 Creatinine 1.53, BUN 36, Potassium 4.0, Sodium 138, GFR 52 ?09/03/2021 Creatinine 1.94, BUN 37, Potassium 3.4, Sodium 137, GFR 39 ?03/23/2021 Creatinine 1.74, BUN 30, Potassium 4.0, Sodium 136, GFR 44 ?A complete set of results can be found in Results Review. ?  ?Recommendations:  No changes and encouraged to call if experiencing any fluid symptoms. ?  ?Follow-up plan: ICM clinic phone appointment on 01/13/2022.   91 day device clinic remote transmission 02/05/2022.   ?  ?EP/Cardiology Office Visits:  Recall 03/03/2022 with Dr Gala Romney.  Recall 03/23/2022 with Otilio Saber, PA ?  ?Copy of ICM check sent to Dr. [name].  ? ?3 month ICM trend: 12/12/2021. ? ? ? ?12-14 Month ICM trend:  ? ? ? ?Karie Soda, RN ?12/13/2021 ?2:03 PM ? ?

## 2021-12-28 ENCOUNTER — Other Ambulatory Visit (HOSPITAL_COMMUNITY): Payer: Self-pay | Admitting: Internal Medicine

## 2022-01-13 ENCOUNTER — Telehealth: Payer: Self-pay

## 2022-01-13 ENCOUNTER — Ambulatory Visit (INDEPENDENT_AMBULATORY_CARE_PROVIDER_SITE_OTHER): Payer: Medicare Other

## 2022-01-13 DIAGNOSIS — I5022 Chronic systolic (congestive) heart failure: Secondary | ICD-10-CM

## 2022-01-13 DIAGNOSIS — Z95810 Presence of automatic (implantable) cardiac defibrillator: Secondary | ICD-10-CM | POA: Diagnosis not present

## 2022-01-13 NOTE — Telephone Encounter (Signed)
Spoke with patient and requested a remote transmission to be sent to check monthly fluid levels.  He sent an ealier report but advised was not received yet.  He attempted to send 2nd report but was not working yet.  He will unplug and try to reset the monitor and send another one in the next 30 minutes.   ?

## 2022-01-13 NOTE — Progress Notes (Signed)
EPIC Encounter for ICM Monitoring ? ?Patient Name: Gary Hutchinson is a 61 y.o. male ?Date: 01/13/2022 ?Primary Care Physican: Creola Corn, MD ?Primary Cardiologist: [name] ?Electrophysiologist: Allred ?Bi-V Pacing:  97.1%        ?12/13/2021 Weight: 321 lbs  ?01/13/2022 Weight: 319 lbs   ?  ?Time in AT/AF  0.0 hr/day (0.0%) (taking Xarelto) ?                              ?Spoke with patient and heart failure questions reviewed.  Pt asymptomatic for fluid accumulation.  Reports feeling well at this time and voices no complaints.  Taking Metolazone as needed.  ?  ?Optivol thoracic impedance suggesting trending below baseline.  Impedance pattern alternating days with fluid and return to baseline.  ?  ?Prescribed dosage:  ?Torsemide 20 mg Take 20 mg by mouth in the morning, at noon, and at bedtime. ?Metolazone 2.5 mg 1 tablet as needed. ?Spironolactone 12.5 mg take 1 tablet daily ?  ?Labs:   ?09/23/2021 Creatinine 1.53, BUN 36, Potassium 4.0, Sodium 138, GFR 52 ?09/03/2021 Creatinine 1.94, BUN 37, Potassium 3.4, Sodium 137, GFR 39 ?03/23/2021 Creatinine 1.74, BUN 30, Potassium 4.0, Sodium 136, GFR 44 ?A complete set of results can be found in Results Review. ?  ?Recommendations:   Recommendation to limit salt intake to 2000 mg daily and fluid intake to 64 oz daily.  Encouraged to call if experiencing any fluid symptoms.  ?  ?Follow-up plan: ICM clinic phone appointment on 02/17/2022.   91 day device clinic remote transmission 02/05/2022.   ?  ?EP/Cardiology Office Visits:  Recall 03/03/2022 with Dr Gala Romney.  Recall 03/23/2022 with Otilio Saber, PA ?  ?Copy of ICM check sent to Dr. [name]. .  ? ?3 month ICM trend: 01/13/2022. ? ? ? ?12-14 Month ICM trend:  ? ? ? ?Karie Soda, RN ?01/13/2022 ?3:42 PM ? ?

## 2022-01-21 ENCOUNTER — Other Ambulatory Visit (HOSPITAL_COMMUNITY): Payer: Self-pay | Admitting: Internal Medicine

## 2022-01-29 ENCOUNTER — Other Ambulatory Visit (HOSPITAL_COMMUNITY): Payer: Medicare Other

## 2022-02-05 ENCOUNTER — Ambulatory Visit (INDEPENDENT_AMBULATORY_CARE_PROVIDER_SITE_OTHER): Payer: Medicare Other

## 2022-02-05 DIAGNOSIS — I428 Other cardiomyopathies: Secondary | ICD-10-CM

## 2022-02-05 LAB — CUP PACEART REMOTE DEVICE CHECK
Battery Remaining Longevity: 23 mo
Battery Voltage: 2.92 V
Brady Statistic AP VP Percent: 2.35 %
Brady Statistic AP VS Percent: 0.05 %
Brady Statistic AS VP Percent: 95.73 %
Brady Statistic AS VS Percent: 1.88 %
Brady Statistic RA Percent Paced: 2.37 %
Brady Statistic RV Percent Paced: 13.91 %
Date Time Interrogation Session: 20230503063526
HighPow Impedance: 93 Ohm
Implantable Lead Implant Date: 20090901
Implantable Lead Implant Date: 20170210
Implantable Lead Implant Date: 20170210
Implantable Lead Location: 753858
Implantable Lead Location: 753859
Implantable Lead Location: 753860
Implantable Lead Model: 4598
Implantable Lead Model: 5076
Implantable Lead Model: 6935
Implantable Pulse Generator Implant Date: 20170210
Lead Channel Impedance Value: 1007 Ohm
Lead Channel Impedance Value: 342 Ohm
Lead Channel Impedance Value: 361 Ohm
Lead Channel Impedance Value: 418 Ohm
Lead Channel Impedance Value: 456 Ohm
Lead Channel Impedance Value: 456 Ohm
Lead Channel Impedance Value: 475 Ohm
Lead Channel Impedance Value: 532 Ohm
Lead Channel Impedance Value: 646 Ohm
Lead Channel Impedance Value: 760 Ohm
Lead Channel Impedance Value: 817 Ohm
Lead Channel Impedance Value: 950 Ohm
Lead Channel Impedance Value: 950 Ohm
Lead Channel Pacing Threshold Amplitude: 0.625 V
Lead Channel Pacing Threshold Amplitude: 0.875 V
Lead Channel Pacing Threshold Amplitude: 1.625 V
Lead Channel Pacing Threshold Pulse Width: 0.4 ms
Lead Channel Pacing Threshold Pulse Width: 0.4 ms
Lead Channel Pacing Threshold Pulse Width: 0.4 ms
Lead Channel Sensing Intrinsic Amplitude: 11.625 mV
Lead Channel Sensing Intrinsic Amplitude: 11.625 mV
Lead Channel Sensing Intrinsic Amplitude: 2 mV
Lead Channel Sensing Intrinsic Amplitude: 2 mV
Lead Channel Setting Pacing Amplitude: 1.5 V
Lead Channel Setting Pacing Amplitude: 2 V
Lead Channel Setting Pacing Amplitude: 2.75 V
Lead Channel Setting Pacing Pulse Width: 0.4 ms
Lead Channel Setting Pacing Pulse Width: 0.4 ms
Lead Channel Setting Sensing Sensitivity: 0.3 mV

## 2022-02-15 ENCOUNTER — Other Ambulatory Visit: Payer: Self-pay | Admitting: Internal Medicine

## 2022-02-17 ENCOUNTER — Ambulatory Visit (INDEPENDENT_AMBULATORY_CARE_PROVIDER_SITE_OTHER): Payer: Medicare Other

## 2022-02-17 DIAGNOSIS — Z95810 Presence of automatic (implantable) cardiac defibrillator: Secondary | ICD-10-CM

## 2022-02-17 DIAGNOSIS — I5022 Chronic systolic (congestive) heart failure: Secondary | ICD-10-CM

## 2022-02-19 NOTE — Progress Notes (Signed)
Remote ICD transmission.   

## 2022-02-19 NOTE — Progress Notes (Signed)
EPIC Encounter for ICM Monitoring ? ?Patient Name: Gary Hutchinson is a 61 y.o. male ?Date: 02/19/2022 ?Primary Care Physican: Creola Corn, MD ?Primary Cardiologist: [name] ?Electrophysiologist: Allred ?Bi-V Pacing:  94.3%        ?12/13/2021 Weight: 321 lbs  ?01/13/2022 Weight: 319 lbs   ?02/19/2022 Weight: 315 lbs ?  ?Time in AT/AF  0.0 hr/day (0.0%) (taking Xarelto) ?                              ?Spoke with patient and heart failure questions reviewed.  Pt asymptomatic for fluid accumulation.   Pt had mild COVID case 2 weeks ago but has recovered. ?  ?Optivol thoracic impedance suggesting normal fluid levels.  Impedance pattern alternating days with fluid and return to baseline.  ?  ?Prescribed dosage:  ?Torsemide 20 mg Take 20 mg by mouth in the morning, at noon, and at bedtime. ?Metolazone 2.5 mg 1 tablet as needed. ?Spironolactone 12.5 mg take 1 tablet daily ?  ?Labs:   ?09/23/2021 Creatinine 1.53, BUN 36, Potassium 4.0, Sodium 138, GFR 52 ?09/03/2021 Creatinine 1.94, BUN 37, Potassium 3.4, Sodium 137, GFR 39 ?03/23/2021 Creatinine 1.74, BUN 30, Potassium 4.0, Sodium 136, GFR 44 ?A complete set of results can be found in Results Review. ?  ?Recommendations:   Encouraged to call if experiencing any fluid symptoms.  ?  ?Follow-up plan: ICM clinic phone appointment on 03/24/2022.   91 day device clinic remote transmission 05/07/2022.   ?  ?EP/Cardiology Office Visits:  03/11/2022 with HF clinic PA/NP.  Recall 03/23/2022 with Otilio Saber, PA ?  ?Copy of ICM check sent to Dr. [name]. ? ?3 month ICM trend: 02/17/2022. ? ? ? ?12-14 Month ICM trend:  ? ? ? ?Karie Soda, RN ?02/19/2022 ?2:12 PM ? ?

## 2022-03-10 NOTE — Progress Notes (Signed)
Patient ID: Gary Hutchinson, male   DOB: 1961-03-21, 61 y.o.   MRN: [id number]    Advanced Heart Failure Clinic Note   PCP: Shon Baton EP: Thompson Grayer HF Cardiology: Dr. Haroldine Laws  HPI: Gary Hutchinson is a 61 y.o. male with a history of chronic systolic heart failure, ICM s/p ICD, HTN, DM II, OSA on CPAP, history of ventricular tachycardia status post AICD placement in 2009, atrial fibrillation and morbid obesity.    Admitted 6/15 for A/C HF and cardiogenic shock (co-ox 42%). Diuresed with IV lasix and milrinone which were weaned off.  He went into Afib with RVR and chemically cardioverted back to NSR with IV amiodarone.   [place] 8/15): Left sided filling pressures well compensated, mild pulm HTN with elevated right-sided pressures and mod/severely depressed CO. Discussion about trial of milrinone but patient wanted to hold off.  Admitted 1/17 with volume overload, cellulitis and AF. Diuresed with IV lasix and required short term milrinone. Loaded on amio and had successful DC-CV. Discharge weight was 307 pounds.   Admitted 2/18 with Influenza A/B. 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 Seen by EP and managed conservatively.  S/p successful DCCV 07/29/2017.  Today he returns for HF follow up. Last seen 12/21 and doing well, OptiVol stable, stable NYHA II symptoms. Overall feeling fine. Walking daily and has no SOB with this, does not do stairs due to right knee OA. Denies CP, dizziness, edema, or PND/Orthopnea. Appetite ok. No fever or chills. Weight at home 318 pounds. Taking all medications. Wears CPAP nightly. Takes metolazone PRN, but not very often. Got a new TM and planning on getting more active. Working with PCP on blood sugars. sBP at home 110/60-70s.  Cardiac Studies:   05/03/12: EF 25-30%.  Grade 1 diastolic dysfunction.  Mild MR.  Mod dilated LA.   07/27/13: EF 40%, diff HK, MR mild, LA mild/mod dilated 03/2014: EF30-35%, grade II DD, mild MR, RV systolic fx  mildly decreased 10/2014: EF 40%.  10/2015: EF ~40%. RV mildly dilated.  8/17 EF 25-35% 07/27/2017 EF  25- 30% 3/21: EF ~30% RV ok. Personally reviewed  SH: Lives with son in [place]. Disabled. No ETOH or tobacco abuse  FH; Mother living: HT        Father deceased: was not part of his life so not sure health issues  Review of systems complete and found to be negative unless listed in HPI.   Past Medical History:  Diagnosis Date   AICD (automatic cardioverter/defibrillator) present    Medtronic   Alcohol abuse    now quit   Anemia    iron defi   Arthritis    "fingers, knees, some days all my joints ache" (11/16/2015)   Asthma    Cholecystitis    Chronic systolic congestive heart failure ([place])    a. RHC (02/2011) RA 31/27, RV 71/27, PA67/45, PCWP 46, PA 49%, Fick CO: 3.4 b. ECHO (03/2014) EF 30-35%, grade II DD, mild MR, RV poorly visualized appears mildly decreased c. RHC (05/2014) RA 13, RV 54/4/11, PA 60/22 (37), PCWP 17, Fick CO/CI: 4.4 / 1.9, Thermo CO/CI: 3.8 /1.6, PVR 5.2 WU, PA 57% and 59%   Diverticulosis of colon    Gout    Hemorrhoids, internal    Hyperlipidemia    Hypertension    Morbid obesity ([place])    Nonischemic cardiomyopathy ([place])    a. LHC (02/2011) normal coronaries   OSA on CPAP    Paroxysmal  atrial fibrillation (HCC)    Pneumonia 2000s X 1   Pulmonary hypertension (HCC)    Renal insufficiency    Type II diabetes mellitus (HCC)    Ventricular tachycardia    s/p MDT ICD implant   Current Outpatient Medications  Medication Sig Dispense Refill   acetaminophen (TYLENOL) 500 MG tablet Take 500 mg by mouth every 6 (six) hours as needed for moderate pain or headache.      albuterol (VENTOLIN HFA) 108 (90 Base) MCG/ACT inhaler Inhale 2 puffs into the lungs every 6 (six) hours as needed for wheezing or shortness of breath.     allopurinol (ZYLOPRIM) 100 MG tablet Take 100 mg by mouth daily.     atorvastatin (LIPITOR) 80 MG tablet Take 80 mg by mouth at bedtime.      colchicine 0.6 MG tablet Take 0.6 mg by mouth daily as needed (for gout flares).      dapagliflozin propanediol (FARXIGA) 10 MG TABS tablet Take 10 mg by mouth daily.     digoxin (LANOXIN) 0.25 MG tablet TAKE 1/2 TABLET BY MOUTH EVERY OTHER DAY 45 tablet 3   ENTRESTO 97-103 MG TAKE 1 TABLET BY MOUTH TWICE A DAY 180 tablet 3   Fluticasone-Salmeterol (ADVAIR) 250-50 MCG/DOSE AEPB Inhale 1 puff into the lungs every 12 (twelve) hours as needed (for flares/shortness of breath). Reported on 02/06/2016     hydrALAZINE (APRESOLINE) 25 MG tablet TAKE 1.5 TABLETS (37.5 MG TOTAL) BY MOUTH 3 (THREE) TIMES DAILY. 405 tablet 3   insulin detemir (LEVEMIR) 100 UNIT/ML injection Inject 40 Units into the skin at bedtime.      insulin lispro (HUMALOG) 100 UNIT/ML injection Inject 10 Units into the skin See admin instructions. 4 to 5 times daily with a meal, depending on how many meals are eaten that day.     isosorbide mononitrate (IMDUR) 30 MG 24 hr tablet TAKE 1 TABLET (30 MG TOTAL) BY MOUTH 2 TIMES DAILY. 180 tablet 3   metolazone (ZAROXOLYN) 2.5 MG tablet Take 2.5 mg by mouth daily as needed (for edema).     montelukast (SINGULAIR) 10 MG tablet Take 10 mg by mouth as needed (allergies).     ondansetron (ZOFRAN-ODT) 8 MG disintegrating tablet Take 1 tablet (8 mg total) by mouth every 8 (eight) hours as needed for nausea or vomiting. 20 tablet 0   sotalol (BETAPACE) 80 MG tablet Take 1 tablet (80 mg total) by mouth 2 (two) times daily. Please call and schedule 6 month follow up appointment 180 tablet 1   spironolactone (ALDACTONE) 25 MG tablet Take 12.5 mg by mouth daily.      torsemide (DEMADEX) 20 MG tablet TAKE 2 TABLETS (40 MG TOTAL) BY MOUTH EVERY MORNING AND 1 TABLET (20 MG TOTAL) AT BEDTIME. 270 tablet 3   XARELTO 20 MG TABS tablet TAKE 1 TABLET BY MOUTH DAILY WITH SUPPER. 90 tablet 3   No current facility-administered medications for this visit.   There were no vitals taken for this visit.  Wt Readings  from Last 3 Encounters:  09/24/21 (!) 149.2 kg (329 lb)  09/03/21 (!) 145.2 kg (320 lb)  05/23/21 (!) 146.7 kg (323 lb 6.4 oz)    General:  NAD. No resp difficulty HEENT: Normal Neck: Supple. Thick neck, JVP difficult. Carotids 2+ bilat; no bruits. No lymphadenopathy or thryomegaly appreciated. Cor: PMI nondisplaced. Regular rate & rhythm. No rubs, gallops or murmurs. Lungs: Clear, diminished in bases Abdomen: Obese, nontender, nondistended. No hepatosplenomegaly. No bruits or masses.  Good bowel sounds. Extremities: No cyanosis, clubbing, rash, edema Neuro: Alert & oriented x 3, cranial nerves grossly intact. Moves all 4 extremities w/o difficulty. Affect pleasant.  ECG: SR/V-paced 65 bpm, qtc 484 ms (Personally reviewed)  Device Interrogation: OptiVol impedence well below threshold, thoracic impedence near baseline, no AT/AF, no VT, patient activity ~1.5-2 hrs/day (personally reviewed).  ASSESSMENT & PLAN:  1) Chronic systolic HF: NICM s/p BiV Medtronic - Echo 07/2017 LVEF 25-30%.  - Echo 12/14/19 EF 30% - NYHA II. Volume status stable on exam and Optivol. - Continue torsemide 40 mg/20 mg.  - Continue Entresto 97/103 mg bid. - Continue spiro 12.5 mg daily. - Off bisoprolol in 2018. On sotalol 80 mg bid.  - Continue digoxin 0.0625 mg every other day.  - Continue hydralazine 25 mg tid + Imdur 30 mg bid. - Continue Farxiga 10 mg daily. No GU symptoms. - Update echo - Check BMET & dig level today.   2) CKD stage III  - Followed previously by nephrology. - Creatinine 1.5-1.9 - Check BMET today.    3) HTN  - Stable today. BP at home ~ 110/60-70s. - Continue current medications.  4) OSA  - Continue Bipap.  - Compliant.   5) PAF/AFL - S/P multiple DCCVs. Most recent 07/29/17.  - Had episode of AFL with ICD shock in 2/18 at time of influenza - Pt refused ablation consideration.  - Continue Xarelto 20 mg daily. Denies bleeding.  - Continue Sotalol per EP.  - ECG in  NSR/V-paced today  - CBC and Mag today.  6) Obesity   - There is no height or weight on file to calculate BMI.  - Needs weight loss. - May benefit from semaglutide, will defer to PCP.  7) Amiodarone thyroxicity   - Off Amiodarone. Completed Methimazole treatment per PCP.   - No change.  8) DM2 - Continue insulin + Farxiga. - Followed by PCP. - Last A1C 8+  Follow with with Dr. Haroldine Laws in 6 months with echo.  [place], FNP  03/10/2022 8:12 AM

## 2022-03-11 ENCOUNTER — Ambulatory Visit (HOSPITAL_COMMUNITY)
Admission: RE | Admit: 2022-03-11 | Discharge: 2022-03-11 | Disposition: A | Discharge status: Home / Self Care | Payer: Medicare Other | Source: Ambulatory Visit | Attending: Family Medicine | Admitting: Family Medicine

## 2022-03-11 ENCOUNTER — Ambulatory Visit (HOSPITAL_BASED_OUTPATIENT_CLINIC_OR_DEPARTMENT_OTHER)
Admission: RE | Admit: 2022-03-11 | Discharge: 2022-03-11 | Disposition: A | Discharge status: Home / Self Care | Payer: Medicare Other | Source: Ambulatory Visit | Attending: Family Medicine | Admitting: Family Medicine

## 2022-03-11 ENCOUNTER — Encounter (HOSPITAL_COMMUNITY): Payer: Self-pay

## 2022-03-11 VITALS — BP 104/66 | HR 77 | Wt 311.0 lb

## 2022-03-11 DIAGNOSIS — Z79899 Other long term (current) drug therapy: Secondary | ICD-10-CM | POA: Diagnosis not present

## 2022-03-11 DIAGNOSIS — Z7901 Long term (current) use of anticoagulants: Secondary | ICD-10-CM | POA: Diagnosis not present

## 2022-03-11 DIAGNOSIS — M1711 Unilateral primary osteoarthritis, right knee: Secondary | ICD-10-CM | POA: Insufficient documentation

## 2022-03-11 DIAGNOSIS — I130 Hypertensive heart and chronic kidney disease with heart failure and stage 1 through stage 4 chronic kidney disease, or unspecified chronic kidney disease: Secondary | ICD-10-CM | POA: Diagnosis not present

## 2022-03-11 DIAGNOSIS — I5022 Chronic systolic (congestive) heart failure: Secondary | ICD-10-CM | POA: Diagnosis not present

## 2022-03-11 DIAGNOSIS — E1122 Type 2 diabetes mellitus with diabetic chronic kidney disease: Secondary | ICD-10-CM | POA: Diagnosis not present

## 2022-03-11 DIAGNOSIS — E0580 Other thyrotoxicosis without thyrotoxic crisis or storm: Secondary | ICD-10-CM

## 2022-03-11 DIAGNOSIS — T462X5A Adverse effect of other antidysrhythmic drugs, initial encounter: Secondary | ICD-10-CM

## 2022-03-11 DIAGNOSIS — Z7985 Long-term (current) use of injectable non-insulin antidiabetic drugs: Secondary | ICD-10-CM | POA: Insufficient documentation

## 2022-03-11 DIAGNOSIS — G4733 Obstructive sleep apnea (adult) (pediatric): Secondary | ICD-10-CM | POA: Diagnosis not present

## 2022-03-11 DIAGNOSIS — Z95810 Presence of automatic (implantable) cardiac defibrillator: Secondary | ICD-10-CM | POA: Diagnosis not present

## 2022-03-11 DIAGNOSIS — Z6841 Body Mass Index (BMI) 40.0 and over, adult: Secondary | ICD-10-CM | POA: Insufficient documentation

## 2022-03-11 DIAGNOSIS — E669 Obesity, unspecified: Secondary | ICD-10-CM | POA: Diagnosis not present

## 2022-03-11 DIAGNOSIS — N1832 Chronic kidney disease, stage 3b: Secondary | ICD-10-CM | POA: Diagnosis not present

## 2022-03-11 DIAGNOSIS — I480 Paroxysmal atrial fibrillation: Secondary | ICD-10-CM

## 2022-03-11 DIAGNOSIS — I10 Essential (primary) hypertension: Secondary | ICD-10-CM | POA: Diagnosis not present

## 2022-03-11 DIAGNOSIS — I428 Other cardiomyopathies: Secondary | ICD-10-CM | POA: Diagnosis not present

## 2022-03-11 DIAGNOSIS — I4892 Unspecified atrial flutter: Secondary | ICD-10-CM | POA: Insufficient documentation

## 2022-03-11 DIAGNOSIS — Z794 Long term (current) use of insulin: Secondary | ICD-10-CM | POA: Insufficient documentation

## 2022-03-11 DIAGNOSIS — I340 Nonrheumatic mitral (valve) insufficiency: Secondary | ICD-10-CM | POA: Diagnosis present

## 2022-03-11 DIAGNOSIS — N183 Chronic kidney disease, stage 3 unspecified: Secondary | ICD-10-CM | POA: Diagnosis not present

## 2022-03-11 LAB — ECHOCARDIOGRAM COMPLETE
AR max vel: 2.28 cm2
AV Peak grad: 4.3 mmHg
Ao pk vel: 1.04 m/s
Area-P 1/2: 4.74 cm2
Calc EF: 33.4 %
MV M vel: 4.72 m/s
MV Peak grad: 89.1 mmHg
S' Lateral: 5.5 cm
Single Plane A2C EF: 36 %
Single Plane A4C EF: 33.2 %

## 2022-03-11 LAB — BASIC METABOLIC PANEL
Anion gap: 12 (ref 5–15)
BUN: 48 mg/dL — ABNORMAL HIGH (ref 8–23)
CO2: 21 mmol/L — ABNORMAL LOW (ref 22–32)
Calcium: 9 mg/dL (ref 8.9–10.3)
Chloride: 103 mmol/L (ref 98–111)
Creatinine, Ser: 1.73 mg/dL — ABNORMAL HIGH (ref 0.61–1.24)
GFR, Estimated: 44 mL/min — ABNORMAL LOW (ref >60)
Glucose, Bld: 117 mg/dL — ABNORMAL HIGH (ref 70–99)
Potassium: 3.7 mmol/L (ref 3.5–5.1)
Sodium: 136 mmol/L (ref 135–145)

## 2022-03-11 LAB — CBC
HCT: 38.9 % — ABNORMAL LOW (ref 39.0–52.0)
Hemoglobin: 11.7 g/dL — ABNORMAL LOW (ref 13.0–17.0)
MCH: 25.9 pg — ABNORMAL LOW (ref 26.0–34.0)
MCHC: 30.1 g/dL (ref 30.0–36.0)
MCV: 86.3 fL (ref 80.0–100.0)
Platelets: 218 10*3/uL (ref 150–400)
RBC: 4.51 MIL/uL (ref 4.22–5.81)
RDW: 15.5 % (ref 11.5–15.5)
WBC: 7.2 10*3/uL (ref 4.0–10.5)
nRBC: 0 % (ref 0.0–0.2)

## 2022-03-11 NOTE — Patient Instructions (Addendum)
Thank you for coming in today  Labs were done today, if any labs are abnormal the clinic will call you No news is good news  Your physician recommends that you schedule a follow-up appointment in:  5-6 months with Dr. Gala Romney   At the [facility], you and your health needs are our priority. As part of our continuing mission to provide you with exceptional heart care, we have created designated Provider Care Teams. These Care Teams include your primary Cardiologist (physician) and Advanced Practice Providers (APPs- Physician Assistants and Nurse Practitioners) who all work together to provide you with the care you need, when you need it.   You may see any of the following providers on your designated Care Team at your next follow up: Dr Arvilla Meres Dr Carron Curie, NP Robbie Lis, [place] [facility] [place], [place] Karle Plumber, PharmD   Please be sure to bring in all your medications bottles to every appointment.   If you have any questions or concerns before your next appointment please send [place] a message through [place] or call our office at [phone].    TO LEAVE A MESSAGE FOR THE NURSE SELECT OPTION 2, PLEASE LEAVE A MESSAGE INCLUDING: YOUR NAME DATE OF BIRTH CALL BACK NUMBER REASON FOR CALL**this is important as we prioritize the call backs  YOU WILL RECEIVE A CALL BACK THE SAME DAY AS LONG AS YOU CALL BEFORE 4:00 PM

## 2022-03-14 ENCOUNTER — Ambulatory Visit (HOSPITAL_COMMUNITY)
Admission: RE | Admit: 2022-03-14 | Discharge: 2022-03-14 | Disposition: A | Discharge status: Home / Self Care | Payer: Medicare Other | Source: Ambulatory Visit | Attending: Cardiology | Admitting: Cardiology

## 2022-03-14 DIAGNOSIS — I5022 Chronic systolic (congestive) heart failure: Secondary | ICD-10-CM | POA: Insufficient documentation

## 2022-03-14 LAB — DIGOXIN LEVEL: Digoxin Level: 0.5 ng/mL — ABNORMAL LOW (ref 0.8–2.0)

## 2022-03-17 ENCOUNTER — Other Ambulatory Visit (HOSPITAL_COMMUNITY): Payer: Medicare Other

## 2022-03-24 ENCOUNTER — Ambulatory Visit (INDEPENDENT_AMBULATORY_CARE_PROVIDER_SITE_OTHER): Payer: Medicare Other

## 2022-03-24 ENCOUNTER — Telehealth: Payer: Self-pay

## 2022-03-24 DIAGNOSIS — I5022 Chronic systolic (congestive) heart failure: Secondary | ICD-10-CM | POA: Diagnosis not present

## 2022-03-24 DIAGNOSIS — Z95810 Presence of automatic (implantable) cardiac defibrillator: Secondary | ICD-10-CM | POA: Diagnosis not present

## 2022-03-24 NOTE — Telephone Encounter (Signed)
Spoke with patient and advised to send manual remote transmission since automatic was not received today to check fluid levels.  He agreed to do so.

## 2022-03-25 ENCOUNTER — Other Ambulatory Visit (HOSPITAL_COMMUNITY): Payer: Self-pay | Admitting: *Deleted

## 2022-03-25 MED ORDER — ISOSORBIDE MONONITRATE ER 30 MG PO TB24: 30.0000 mg | ORAL_TABLET | Freq: Two times a day (BID) | ORAL | 3 refills | Status: DC

## 2022-03-27 NOTE — Progress Notes (Signed)
EPIC Encounter for ICM Monitoring  Patient Name: Gary Hutchinson is a 61 y.o. male Date: 03/27/2022 Primary Care Physican: Creola Corn, MD Primary Cardiologist: [name] Electrophysiologist: Allred Bi-V Pacing:  97.5%        12/13/2021 Weight: 321 lbs  01/13/2022 Weight: 319 lbs   02/19/2022 Weight: 315 lbs 03/27/2022 Weight: 310 lbs   Time in AT/AF  0.0 hr/day (0.0%) (taking Xarelto)                               Spoke with patient and heart failure questions reviewed.  Pt asymptomatic for fluid accumulation.  Reports feeling well at this time and voices no complaints.    Optivol thoracic impedance suggesting normal fluid levels.  Impedance pattern alternating days with fluid and return to baseline.    Prescribed dosage:  Torsemide 20 mg Take 20 mg by mouth in the morning, at noon, and at bedtime. Metolazone 2.5 mg 1 tablet as needed. Spironolactone 12.5 mg take 1 tablet daily   Labs:   106/03/2022 Creatinine 1.73, BUN 48, Potassium 3.7, Sodium 136, GFR 44 A complete set of results can be found in Results Review.   Recommendations:  No changes and encouraged to call if experiencing any fluid symptoms.   Follow-up plan: ICM clinic phone appointment on 05/05/2022.   91 day device clinic remote transmission 05/07/2022.     EP/Cardiology Office Visits:  09/10/2022 with HF clinic PA/NP.  Recall 03/23/2022 with Otilio Saber, PA   Copy of ICM check sent to Dr. [name].  3 month ICM trend: 03/24/2022.    12-14 Month ICM trend:     Karie Soda, RN 03/27/2022 11:17 AM

## 2022-05-02 NOTE — Progress Notes (Unsigned)
Electrophysiology Office Note Date: 05/06/2022  ID:  Gary Hutchinson, DOB April 19, 1961, MRN [id number]  PCP: Creola Corn, MD Primary Cardiologist: None Electrophysiologist: Hillis Range, MD   CC: Routine ICD follow-up  Gary Hutchinson is a 61 y.o. male seen today for Hillis Range, MD for routine electrophysiology followup.  Since last being seen in our clinic the patient reports doing well from a heart perspective. His PCP is adjusting his Ozempic due to ongoing GI issues. Otherwise, he denies chest pain, palpitations, dyspnea, PND, orthopnea, vomiting, dizziness, syncope, edema, weight gain, or early satiety. He has not had ICD shocks.   Device History: Medtronic BiV ICD implanted 2017 for CHF + inappropriate shock for rapid Aflutter (atypical) in the setting of illness w/flu Dec 2018, inappropriate shock for 1:1 AFlutter   AAD:  amiodarone developed thyrotoxicity Dec 2018 started on Sotalol  Past Medical History:  Diagnosis Date   AICD (automatic cardioverter/defibrillator) present    Medtronic   Alcohol abuse    now quit   Anemia    iron defi   Arthritis    "fingers, knees, some days all my joints ache" (11/16/2015)   Asthma    Cholecystitis    Chronic systolic congestive heart failure (HCC)    a. RHC (02/2011) RA 31/27, RV 71/27, PA67/45, PCWP 46, PA 49%, Fick CO: 3.4 b. ECHO (03/2014) EF 30-35%, grade II DD, mild MR, RV poorly visualized appears mildly decreased c. RHC (05/2014) RA 13, RV 54/4/11, PA 60/22 (37), PCWP 17, Fick CO/CI: 4.4 / 1.9, Thermo CO/CI: 3.8 /1.6, PVR 5.2 WU, PA 57% and 59%   Diverticulosis of colon    Gout    Hemorrhoids, internal    Hyperlipidemia    Hypertension    Morbid obesity (HCC)    Nonischemic cardiomyopathy (HCC)    a. LHC (02/2011) normal coronaries   OSA on CPAP    Paroxysmal atrial fibrillation (HCC)    Pneumonia 2000s X 1   Pulmonary hypertension (HCC)    Renal insufficiency    Type II diabetes mellitus (HCC)    Ventricular  tachycardia [facility])    s/p MDT ICD implant   Past Surgical History:  Procedure Laterality Date   CARDIAC CATHETERIZATION  03/08/2004   EF 25-30%   CARDIOVERSION N/A 10/17/2015   Procedure: CARDIOVERSION;  Surgeon: [name], MD;  Location: [facility] ENDOSCOPY;  Service: Cardiovascular;  Laterality: N/A;   EP IMPLANTABLE DEVICE N/A 11/16/2015   Procedure: BiVI Upgrade;  Surgeon: Hillis Range, MD;  Medtronic Viva Quad XT   INSERTION OF ICD  06/2008   ICD- Medtronic    RIGHT HEART CATHETERIZATION N/A 05/23/2014   Procedure: RIGHT HEART CATH;  Surgeon: [name], MD;  Location[facility] CATH LAB;  Service: Cardiovascular;  Laterality: N/A;   TRANSTHORACIC ECHOCARDIOGRAM  05/27/2008   EF 30-35%   US ECHOCARDIOGRAPHY  08/22/2008   EF 30-35%    Current Outpatient Medications  Medication Sig Dispense Refill   acetaminophen (TYLENOL) 500 MG tablet Take 500 mg by mouth every 6 (six) hours as needed for moderate pain or headache.      albuterol (VENTOLIN HFA) 108 (90 Base) MCG/ACT inhaler Inhale 2 puffs into the lungs every 6 (six) hours as needed for wheezing or shortness of breath.     allopurinol (ZYLOPRIM) 100 MG tablet Take 100 mg by mouth daily.     atorvastatin (LIPITOR) 80 MG tablet Take 80 mg by mouth at bedtime.     colchicine 0.6 MG  tablet Take 0.6 mg by mouth daily as needed (for gout flares).      dapagliflozin propanediol (FARXIGA) 10 MG TABS tablet Take 10 mg by mouth daily.     digoxin (LANOXIN) 0.125 MG tablet Take 0.5 tablets by mouth every other day.     ENTRESTO 97-103 MG TAKE 1 TABLET BY MOUTH TWICE A DAY 180 tablet 3   Fluticasone-Salmeterol (ADVAIR) 250-50 MCG/DOSE AEPB Inhale 1 puff into the lungs every 12 (twelve) hours as needed (for flares/shortness of breath). Reported on 02/06/2016     hydrALAZINE (APRESOLINE) 25 MG tablet TAKE 1.5 TABLETS (37.5 MG TOTAL) BY MOUTH 3 (THREE) TIMES DAILY. 405 tablet 3   insulin detemir (LEVEMIR) 100 UNIT/ML injection Inject 40 Units into the  skin at bedtime.      insulin lispro (HUMALOG) 100 UNIT/ML injection Inject 10 Units into the skin See admin instructions. 4 to 5 times daily with a meal, depending on how many meals are eaten that day.     isosorbide mononitrate (IMDUR) 30 MG 24 hr tablet Take 1 tablet (30 mg total) by mouth in the morning and at bedtime. 180 tablet 3   metolazone (ZAROXOLYN) 2.5 MG tablet Take 2.5 mg by mouth daily as needed (for edema).     montelukast (SINGULAIR) 10 MG tablet Take 10 mg by mouth as needed (allergies).     ondansetron (ZOFRAN-ODT) 8 MG disintegrating tablet Take 1 tablet (8 mg total) by mouth every 8 (eight) hours as needed for nausea or vomiting. 20 tablet 0   OZEMPIC, 1 MG/DOSE, 4 MG/3ML SOPN Inject 1 mg into the skin once a week.     sotalol (BETAPACE) 80 MG tablet Take 1 tablet (80 mg total) by mouth 2 (two) times daily. Please call and schedule 6 month follow up appointment 180 tablet 1   spironolactone (ALDACTONE) 25 MG tablet Take 12.5 mg by mouth daily.      torsemide (DEMADEX) 20 MG tablet TAKE 2 TABLETS (40 MG TOTAL) BY MOUTH EVERY MORNING AND 1 TABLET (20 MG TOTAL) AT BEDTIME. 270 tablet 3   XARELTO 20 MG TABS tablet TAKE 1 TABLET BY MOUTH DAILY WITH SUPPER. 90 tablet 3   No current facility-administered medications for this visit.    Allergies:   Patient has no known allergies.   Social History: Social History   Socioeconomic History   Marital status: Single    Spouse name: Not on file   Number of children: 4   Years of education: Not on file   Highest education level: Not on file  Occupational History    Employer: DISABLED  Tobacco Use   Smoking status: Never   Smokeless tobacco: Never  Vaping Use   Vaping Use: Never used  Substance and Sexual Activity   Alcohol use: Not Currently    Comment: former heavy ETOH, quit in "the late '90s"   Drug use: No   Sexual activity: Not Currently  Other Topics Concern   Not on file  Social History Narrative   disabled    Social Determinants of Health   Financial Resource Strain: Not on file  Food Insecurity: Not on file  Transportation Needs: Not on file  Physical Activity: Not on file  Stress: Not on file  Social Connections: Not on file  Intimate Partner Violence: Not on file    Family History: Family History  Problem Relation Age of Onset   Cancer Father    Diabetes Other  grandparents   Hypertension Mother    CAD Other        No family history    Review of Systems: All other systems reviewed and are otherwise negative except as noted above.   Physical Exam: Vitals:   05/06/22 1053  BP: (!) 150/78  Pulse: (!) 50  SpO2: 98%  Weight: (!) 308 lb (139.7 kg)  Height: 5\' 6"  (1.676 m)     GEN- The patient is well appearing, alert and oriented x 3 today.   HEENT: normocephalic, atraumatic; sclera clear, conjunctiva pink; hearing intact; oropharynx clear; neck supple, no JVP Lymph- no cervical lymphadenopathy Lungs- Clear to ausculation bilaterally, normal work of breathing.  No wheezes, rales, rhonchi Heart- Regular rate and rhythm, no murmurs, rubs or gallops, PMI not laterally displaced GI- soft, non-tender, non-distended, bowel sounds present, no hepatosplenomegaly Extremities- no clubbing or cyanosis. No edema; DP/PT/radial pulses 2+ bilaterally MS- no significant deformity or atrophy Skin- warm and dry, no rash or lesion; ICD pocket well healed Psych- euthymic mood, full affect Neuro- strength and sensation are intact  ICD interrogation- reviewed in detail today,  See PACEART report  EKG:  EKG is not ordered today. Personal review of EKG ordered 03/11/2022 shows AS VP rhythm at 79 bpm with stable QT  Recent Labs: 09/03/2021: Magnesium 2.6 03/11/2022: BUN 48; Creatinine, Ser 1.73; Hemoglobin 11.7; Platelets 218; Potassium 3.7; Sodium 136   Wt Readings from Last 3 Encounters:  05/06/22 (!) 308 lb (139.7 kg)  03/11/22 (!) 311 lb (141.1 kg)  09/24/21 (!) 329 lb (149.2 kg)      Other studies Reviewed: Additional studies/ records that were reviewed today include: Previous EP office notes.   Assessment and Plan:  1.  Chronic systolic dysfunction s/p Medtronic CRT-D  euvolemic today Stable on an appropriate medical regimen Normal ICD function See Pace Art report No changes today  2. H/o VT EKG last month shows stable QT on sotalol  3. Paroxysmal Atrial Fibrillation Continue Xarelto for CHA2DS2VASC of at least 3    4. HTN BP elevated today.  He has not taken his am medications due to nausea  5. Morbid Obesity Body mass index is 49.71 kg/m.  On Ozempic per PCP.   Current medicines are reviewed at length with the patient today.    Labs/ tests ordered today include:  Orders Placed This Encounter  Procedures   Basic metabolic panel   CBC   Magnesium     Disposition:   Follow up with Dr. Curt Bears in 6 months    Signed, Shirley Friar, [name]  05/06/2022 10:55 AM  [place] [address] [place] [address] [phone] (office) [phone] (fax)

## 2022-05-05 ENCOUNTER — Ambulatory Visit (INDEPENDENT_AMBULATORY_CARE_PROVIDER_SITE_OTHER): Payer: Medicare Other

## 2022-05-05 DIAGNOSIS — Z95810 Presence of automatic (implantable) cardiac defibrillator: Secondary | ICD-10-CM

## 2022-05-05 DIAGNOSIS — I5022 Chronic systolic (congestive) heart failure: Secondary | ICD-10-CM | POA: Diagnosis not present

## 2022-05-06 ENCOUNTER — Encounter: Payer: Self-pay | Admitting: Student

## 2022-05-06 ENCOUNTER — Ambulatory Visit (INDEPENDENT_AMBULATORY_CARE_PROVIDER_SITE_OTHER): Payer: Medicare Other | Admitting: Student

## 2022-05-06 VITALS — BP 150/78 | HR 50 | Ht 66.0 in | Wt 308.0 lb

## 2022-05-06 DIAGNOSIS — N1832 Chronic kidney disease, stage 3b: Secondary | ICD-10-CM | POA: Diagnosis not present

## 2022-05-06 DIAGNOSIS — Z95810 Presence of automatic (implantable) cardiac defibrillator: Secondary | ICD-10-CM

## 2022-05-06 DIAGNOSIS — I5022 Chronic systolic (congestive) heart failure: Secondary | ICD-10-CM | POA: Diagnosis not present

## 2022-05-06 DIAGNOSIS — I480 Paroxysmal atrial fibrillation: Secondary | ICD-10-CM

## 2022-05-06 LAB — CUP PACEART INCLINIC DEVICE CHECK
Battery Remaining Longevity: 20 mo
Battery Voltage: 2.91 V
Brady Statistic AP VP Percent: 0.8 %
Brady Statistic AP VS Percent: 0.02 %
Brady Statistic AS VP Percent: 97.69 %
Brady Statistic AS VS Percent: 1.49 %
Brady Statistic RA Percent Paced: 0.82 %
Brady Statistic RV Percent Paced: 6.94 %
Date Time Interrogation Session: 20230801111338
HighPow Impedance: 83 Ohm
Implantable Lead Implant Date: 20090901
Implantable Lead Implant Date: 20170210
Implantable Lead Implant Date: 20170210
Implantable Lead Location: 753858
Implantable Lead Location: 753859
Implantable Lead Location: 753860
Implantable Lead Model: 4598
Implantable Lead Model: 5076
Implantable Lead Model: 6935
Implantable Pulse Generator Implant Date: 20170210
Lead Channel Impedance Value: 1083 Ohm
Lead Channel Impedance Value: 1121 Ohm
Lead Channel Impedance Value: 1140 Ohm
Lead Channel Impedance Value: 342 Ohm
Lead Channel Impedance Value: 418 Ohm
Lead Channel Impedance Value: 456 Ohm
Lead Channel Impedance Value: 456 Ohm
Lead Channel Impedance Value: 475 Ohm
Lead Channel Impedance Value: 532 Ohm
Lead Channel Impedance Value: 608 Ohm
Lead Channel Impedance Value: 703 Ohm
Lead Channel Impedance Value: 817 Ohm
Lead Channel Impedance Value: 874 Ohm
Lead Channel Pacing Threshold Amplitude: 0.625 V
Lead Channel Pacing Threshold Amplitude: 0.75 V
Lead Channel Pacing Threshold Amplitude: 1.5 V
Lead Channel Pacing Threshold Pulse Width: 0.4 ms
Lead Channel Pacing Threshold Pulse Width: 0.4 ms
Lead Channel Pacing Threshold Pulse Width: 0.4 ms
Lead Channel Sensing Intrinsic Amplitude: 0.875 mV
Lead Channel Sensing Intrinsic Amplitude: 1 mV
Lead Channel Sensing Intrinsic Amplitude: 12.875 mV
Lead Channel Sensing Intrinsic Amplitude: 9.125 mV
Lead Channel Setting Pacing Amplitude: 1.5 V
Lead Channel Setting Pacing Amplitude: 2 V
Lead Channel Setting Pacing Amplitude: 2.5 V
Lead Channel Setting Pacing Pulse Width: 0.4 ms
Lead Channel Setting Pacing Pulse Width: 0.4 ms
Lead Channel Setting Sensing Sensitivity: 0.3 mV

## 2022-05-06 LAB — BASIC METABOLIC PANEL
BUN/Creatinine Ratio: 25 — ABNORMAL HIGH (ref 10–24)
BUN: 40 mg/dL — ABNORMAL HIGH (ref 8–27)
CO2: 22 mmol/L (ref 20–29)
Calcium: 9.5 mg/dL (ref 8.6–10.2)
Chloride: 101 mmol/L (ref 96–106)
Creatinine, Ser: 1.63 mg/dL — ABNORMAL HIGH (ref 0.76–1.27)
Glucose: 239 mg/dL — ABNORMAL HIGH (ref 70–99)
Potassium: 4.3 mmol/L (ref 3.5–5.2)
Sodium: 138 mmol/L (ref 134–144)
eGFR: 48 mL/min/{1.73_m2} — ABNORMAL LOW (ref >59)

## 2022-05-06 LAB — CBC
Hematocrit: 42.4 % (ref 37.5–51.0)
Hemoglobin: 13.6 g/dL (ref 13.0–17.7)
MCH: 26.9 pg (ref 26.6–33.0)
MCHC: 32.1 g/dL (ref 31.5–35.7)
MCV: 84 fL (ref 79–97)
Platelets: 223 10*3/uL (ref 150–450)
RBC: 5.05 x10E6/uL (ref 4.14–5.80)
RDW: 14.7 % (ref 11.6–15.4)
WBC: 8.3 10*3/uL (ref 3.4–10.8)

## 2022-05-06 LAB — MAGNESIUM: Magnesium: 2.9 mg/dL — ABNORMAL HIGH (ref 1.6–2.3)

## 2022-05-06 NOTE — Patient Instructions (Signed)
Medication Instructions:  Your physician recommends that you continue on your current medications as directed. Please refer to the Current Medication list given to you today.  *If you need a refill on your cardiac medications before your next appointment, please call your pharmacy*   Lab Work: TODAY: BMET, Mag, CBC  If you have labs (blood work) drawn today and your tests are completely normal, you will receive your results only by: MyChart Message (if you have MyChart) OR A paper copy in the mail If you have any lab test that is abnormal or we need to change your treatment, we will call you to review the results.   Follow-Up: At [facility], you and your health needs are our priority.  As part of our continuing mission to provide you with exceptional heart care, we have created designated Provider Care Teams.  These Care Teams include your primary Cardiologist (physician) and Advanced Practice Providers (APPs -  Physician Assistants and Nurse Practitioners) who all work together to provide you with the care you need, when you need it.  Your next appointment:   6 month(s)  The format for your next appointment:   In Person  Provider:   Loman Brooklyn, MD

## 2022-05-07 ENCOUNTER — Ambulatory Visit (INDEPENDENT_AMBULATORY_CARE_PROVIDER_SITE_OTHER): Payer: Medicare Other

## 2022-05-07 DIAGNOSIS — I428 Other cardiomyopathies: Secondary | ICD-10-CM

## 2022-05-07 LAB — CUP PACEART REMOTE DEVICE CHECK
Battery Remaining Longevity: 20 mo
Battery Voltage: 2.91 V
Brady Statistic AP VP Percent: 12.51 %
Brady Statistic AP VS Percent: 0.2 %
Brady Statistic AS VP Percent: 86.09 %
Brady Statistic AS VS Percent: 1.2 %
Brady Statistic RA Percent Paced: 12.06 %
Brady Statistic RV Percent Paced: 39.2 %
Date Time Interrogation Session: 20230802043824
HighPow Impedance: 93 Ohm
Implantable Lead Implant Date: 20090901
Implantable Lead Implant Date: 20170210
Implantable Lead Implant Date: 20170210
Implantable Lead Location: 753858
Implantable Lead Location: 753859
Implantable Lead Location: 753860
Implantable Lead Model: 4598
Implantable Lead Model: 5076
Implantable Lead Model: 6935
Implantable Pulse Generator Implant Date: 20170210
Lead Channel Impedance Value: 1026 Ohm
Lead Channel Impedance Value: 1083 Ohm
Lead Channel Impedance Value: 1083 Ohm
Lead Channel Impedance Value: 361 Ohm
Lead Channel Impedance Value: 399 Ohm
Lead Channel Impedance Value: 456 Ohm
Lead Channel Impedance Value: 456 Ohm
Lead Channel Impedance Value: 475 Ohm
Lead Channel Impedance Value: 513 Ohm
Lead Channel Impedance Value: 551 Ohm
Lead Channel Impedance Value: 703 Ohm
Lead Channel Impedance Value: 779 Ohm
Lead Channel Impedance Value: 836 Ohm
Lead Channel Pacing Threshold Amplitude: 0.625 V
Lead Channel Pacing Threshold Amplitude: 0.75 V
Lead Channel Pacing Threshold Amplitude: 1.5 V
Lead Channel Pacing Threshold Pulse Width: 0.4 ms
Lead Channel Pacing Threshold Pulse Width: 0.4 ms
Lead Channel Pacing Threshold Pulse Width: 0.4 ms
Lead Channel Sensing Intrinsic Amplitude: 13.875 mV
Lead Channel Sensing Intrinsic Amplitude: 13.875 mV
Lead Channel Sensing Intrinsic Amplitude: 2 mV
Lead Channel Sensing Intrinsic Amplitude: 2 mV
Lead Channel Setting Pacing Amplitude: 1.5 V
Lead Channel Setting Pacing Amplitude: 2 V
Lead Channel Setting Pacing Amplitude: 2.5 V
Lead Channel Setting Pacing Pulse Width: 0.4 ms
Lead Channel Setting Pacing Pulse Width: 0.4 ms
Lead Channel Setting Sensing Sensitivity: 0.3 mV

## 2022-05-08 NOTE — Progress Notes (Signed)
EPIC Encounter for ICM Monitoring  Patient Name: Gary Hutchinson is a 61 y.o. male Date: 05/08/2022 Primary Care Physican: Creola Corn, MD Primary Cardiologist: [name] Electrophysiologist: Allred Bi-V Pacing:  96.9%        03/27/2022 Weight: 310 lbs 05/08/2022 Weight:  308 lbs   Time in AT/AF  <0.1 hr/day (<0.1%) (taking Xarelto)                               Spoke with patient and heart failure questions reviewed.  Pt asymptomatic for fluid accumulation.  Reports feeling well at this time and voices no complaints.    Optivol thoracic impedance suggesting normal fluid levels.     Prescribed dosage:  Torsemide 20 mg Take 20 mg by mouth in the morning, at noon, and at bedtime. Metolazone 2.5 mg 1 tablet as needed. Spironolactone 12.5 mg take 1 tablet daily   Labs:   05/06/2022 Creatinine 1.63, BUN 40, Potassium 4.3, Sodium 138, GFr 48 07/11/2022 Creatinine 1.73, BUN 48, Potassium 3.7, Sodium 136, GFR 44 A complete set of results can be found in Results Review.   Recommendations:  No changes and encouraged to call if experiencing any fluid symptoms.   Follow-up plan: ICM clinic phone appointment on 06/10/2022.   91 day device clinic remote transmission 08/06/2022.     EP/Cardiology Office Visits:  09/10/2022 with HF clinic PA/NP.  Recall 03/23/2022 with Otilio Saber, PA   Copy of ICM check sent to Dr. [name].  3 month ICM trend: 05/05/2022.    12-14 Month ICM trend:     Karie Soda, RN 05/08/2022 12:17 PM

## 2022-05-18 ENCOUNTER — Other Ambulatory Visit (HOSPITAL_COMMUNITY): Payer: Self-pay | Admitting: Internal Medicine

## 2022-05-23 ENCOUNTER — Ambulatory Visit: Payer: Medicare Other | Admitting: Internal Medicine

## 2022-05-30 NOTE — Progress Notes (Signed)
Remote ICD transmission.   

## 2022-06-01 ENCOUNTER — Other Ambulatory Visit (HOSPITAL_COMMUNITY): Payer: Self-pay | Admitting: Internal Medicine

## 2022-06-03 ENCOUNTER — Other Ambulatory Visit (HOSPITAL_COMMUNITY): Payer: Self-pay | Admitting: Internal Medicine

## 2022-06-03 ENCOUNTER — Telehealth (HOSPITAL_COMMUNITY): Payer: Self-pay | Admitting: Vascular Surgery

## 2022-06-03 NOTE — Telephone Encounter (Signed)
Refill needed digoxin

## 2022-06-10 ENCOUNTER — Ambulatory Visit (INDEPENDENT_AMBULATORY_CARE_PROVIDER_SITE_OTHER): Payer: Medicare Other

## 2022-06-10 DIAGNOSIS — Z95810 Presence of automatic (implantable) cardiac defibrillator: Secondary | ICD-10-CM | POA: Diagnosis not present

## 2022-06-10 DIAGNOSIS — I5022 Chronic systolic (congestive) heart failure: Secondary | ICD-10-CM

## 2022-06-12 NOTE — Progress Notes (Signed)
EPIC Encounter for ICM Monitoring  Patient Name: Gary Hutchinson is a 61 y.o. male Date: 06/12/2022 Primary Care Physican: Creola Corn, MD Primary Cardiologist: [name] Electrophysiologist: Elberta Fortis Bi-V Pacing:  97.8%        03/27/2022 Weight: 310 lbs 05/08/2022 Weight:  308 lbs 9/6/203 Weight: 308 lbs 06/12/2022 Weight: 310 lbs   Time in AT/AF  <0.1 hr/day (<0.1%) (taking Xarelto)                               Spoke with patient and heart failure questions reviewed.  Pt reports 2 lb weight gain over night.      Optivol thoracic impedance suggesting possible fluid accumulation for majority of days since 8/5 but back to normal on 9/3.     Prescribed dosage:  Torsemide 20 mg Take 20 mg by mouth in the morning, at noon, and at bedtime. Metolazone 2.5 mg 1 tablet as needed. Spironolactone 12.5 mg take 1 tablet daily   Labs:   05/06/2022 Creatinine 1.63, BUN 40, Potassium 4.3, Sodium 138, GFr 48 07/11/2022 Creatinine 1.73, BUN 48, Potassium 3.7, Sodium 136, GFR 44 A complete set of results can be found in Results Review.   Recommendations:  Pt reports if weight continues to increase will take Metolazone.  Advised to call if Metolazone is not effective for fluid management   Follow-up plan: ICM clinic phone appointment on 07/21/2022.   91 day device clinic remote transmission 08/06/2022.     EP/Cardiology Office Visits:  09/10/2022 with HF clinic PA/NP.  Recall 03/23/2022 with Otilio Saber, PA   Copy of ICM check sent to Dr. Elberta Fortis.  3 month ICM trend: 06/10/2022.    12-14 Month ICM trend:     Karie Soda, RN 06/12/2022 4:46 PM

## 2022-06-15 NOTE — Progress Notes (Unsigned)
HPI M never smoker followed for OSA, complicated by  AFib, VTach/ AICD, HBP, Chronic Systolic CHF, Amiodarone induce hypothyroid, DM2, Morbid obesity, Gout, Diverticulosis NPSG 04/06/01 AHI 117/ hr, desaturation to 60%, body weight 324 lbs  =========================================================   05/22/21- 60 yoM never smoker followed for OSA, complicated by  AFib, VTach/ AICD, HBP, Chronic Systolic CHF, Amiodarone induce hypothyroid, Pulm Hypertension, DM2, Morbid obesity, Gout, Diverticulosis, Omental Infarction,  BIPAP 17/13/  [employer] Download- didn't bring card Body weight today-323 lbs Covid vax-4 Moderna Comfortable with CPAP and says he is using nightly. Sleeps better with it. Discussed weight. Interested in referral to [facility]. CXR 03/23/21- IMPRESSION: Stable chest radiograph findings with prominent vascular and interstitial lung markings. Findings could be related to chronic vascular congestion. Stable appearance of the cardiac ICD.  06/17/22- 61 yoM never smoker followed for OSA, complicated by  AFib/ Xarelto, VTach/ AICD, HBP, Chronic Systolic CHF/ Pacemaker, Amiodarone induce hypothyroid, Pulm Hypertension, DM2, Morbid obesity, Gout, Diverticulosis, Omental Infarction, -Advair 250, Ventolin hfa,   BIPAP 17/13/  [employer] Download- not available Body weight today-310 lbs Covid vax-4 Moderna -----Pt f/u he is using the machine, it is not transmitting data to resmed. Machine is working well, only question pt has is regarding replacing the machine since it's been a while. He sleeps better with CPAP and has no specific concerns. Says he got good reports recently from his cardiologist and primary doctor.  Has lost some weight.  ROS-see HPI   + = positive Constitutional:    +weight loss, night sweats, fevers, chills, fatigue, lassitude. HEENT:    headaches, difficulty swallowing, tooth/dental problems, sore throat,       sneezing, itching, ear ache, nasal  congestion, post nasal drip, snoring CV:    chest pain, orthopnea, PND, swelling in lower extremities, anasarca,                                   dizziness, palpitations Resp:   shortness of breath with exertion or at rest.                productive cough,   non-productive cough, coughing up of blood.              change in color of mucus.  wheezing.   Skin:    rash or lesions. GI:  No-   heartburn, indigestion, abdominal pain, nausea, vomiting, diarrhea,                 change in bowel habits, loss of appetite GU: dysuria, change in color of urine, no urgency or frequency.   flank pain. MS:   joint pain, stiffness, decreased range of motion, back pain. Neuro-     nothing unusual Psych:  change in mood or affect.  depression or anxiety.   memory loss.  OBJ- Physical Exam General- Alert, Oriented, Affect-appropriate, Distress- none acute, + morbidly obese Skin- rash-none, lesions- none, excoriation- none Lymphadenopathy- none Head- atraumatic            Eyes- Gross vision intact, PERRLA, conjunctivae and secretions clear            Ears- Hearing, canals-normal            Nose- Clear, no-Septal dev, mucus, polyps, erosion, perforation             Throat-+ upper plate, Mallampati III , mucosa clear , drainage- none, tonsils- atrophic Neck- flexible ,  trachea midline, no stridor , thyroid nl, carotid no bruit Chest - symmetrical excursion , unlabored           Heart/CV- RRR , no murmur , no gallop  , no rub, nl s1 s2                           - JVD- none , edema- none, stasis changes- none, varices- none           Lung- clear to P&A, wheeze- none, cough- none , dullness-none, rub- none           Chest wall- + AICD Abd-  Br/ Gen/ Rectal- Not done, not indicated Extrem- cyanosis- none, clubbing, none, atrophy- none, strength- nl Neuro- grossly intact to observation

## 2022-06-17 ENCOUNTER — Ambulatory Visit (INDEPENDENT_AMBULATORY_CARE_PROVIDER_SITE_OTHER): Payer: Medicare Other | Admitting: Internal Medicine

## 2022-06-17 ENCOUNTER — Encounter: Payer: Self-pay | Admitting: Internal Medicine

## 2022-06-17 VITALS — BP 112/70 | HR 83 | Ht 65.0 in | Wt 310.0 lb

## 2022-06-17 DIAGNOSIS — Z95810 Presence of automatic (implantable) cardiac defibrillator: Secondary | ICD-10-CM

## 2022-06-17 DIAGNOSIS — G4733 Obstructive sleep apnea (adult) (pediatric): Secondary | ICD-10-CM

## 2022-06-17 NOTE — Assessment & Plan Note (Signed)
Continue working on this

## 2022-06-17 NOTE — Patient Instructions (Signed)
Order- DME [employer]- please replace old BIPAP machine, 17/13, mask of choice, humidifier, supplies, Airview/ card  Please call if we can help

## 2022-06-17 NOTE — Assessment & Plan Note (Signed)
Benefits from BiPAP with good compliance and control by his report.  He is due for replacement machine which will allow reconnection to Airview for download data. Plan-replace old BiPAP machine 17/13

## 2022-06-17 NOTE — Assessment & Plan Note (Signed)
He continues cardiology follow-up °

## 2022-07-21 ENCOUNTER — Ambulatory Visit (INDEPENDENT_AMBULATORY_CARE_PROVIDER_SITE_OTHER): Payer: Medicare Other

## 2022-07-21 DIAGNOSIS — I5022 Chronic systolic (congestive) heart failure: Secondary | ICD-10-CM | POA: Diagnosis not present

## 2022-07-21 DIAGNOSIS — Z95810 Presence of automatic (implantable) cardiac defibrillator: Secondary | ICD-10-CM

## 2022-07-22 NOTE — Progress Notes (Signed)
EPIC Encounter for ICM Monitoring  Patient Name: Gary Hutchinson is a 61 y.o. male Date: 07/22/2022 Primary Care Physican: Shon Baton, MD Primary Cardiologist: [place] Electrophysiologist: Curt Bears Bi-V Pacing:  98.0%        03/27/2022 Weight: 310 lbs 05/08/2022 Weight:  308 lbs 9/6/203 Weight: 308 lbs 06/12/2022 Weight: 310 lbs 07/22/2022 Weight: 310 lbs   Time in AT/AF  <0.1 hr/day (<0.1%) (taking Xarelto)                               Spoke with patient and heart failure questions reviewed.  Transmission results reviewed.  Pt asymptomatic for fluid accumulation.  Reports feeling well at this time and voices no complaints.     Optivol thoracic impedance suggesting possible fluid accumulation since 10/9.  Impedance also suggesting possible fluid accumulation intermittently 9/11.     Prescribed dosage:  Torsemide 20 mg Take 20 mg by mouth in the morning, at noon, and at bedtime. Metolazone 2.5 mg 1 tablet as needed. Spironolactone 12.5 mg take 1 tablet daily   Labs:   05/06/2022 Creatinine 1.63, BUN 40, Potassium 4.3, Sodium 138, GFr 48 07/11/2022 Creatinine 1.73, BUN 48, Potassium 3.7, Sodium 136, GFR 44 A complete set of results can be found in Results Review.   Recommendations:  He reports he will take a Metolazone to help with fluid accumulation.    Follow-up plan: ICM clinic phone appointment on 07/28/2022 to recheck fluid levels.   91 day device clinic remote transmission 08/06/2022.     EP/Cardiology Office Visits:  09/10/2022 with HF clinic PA/NP.  Recall 03/23/2022 with [name], PA   Copy of ICM check sent to Dr. Curt Bears.  3 month ICM trend: 07/21/2022.    12-14 Month ICM trend:     [name], RN 07/22/2022 9:38 AM

## 2022-07-29 NOTE — Progress Notes (Signed)
No ICM remote transmission received for 07/28/2022 and next ICM transmission scheduled for 08/25/2022.   

## 2022-08-06 ENCOUNTER — Ambulatory Visit (INDEPENDENT_AMBULATORY_CARE_PROVIDER_SITE_OTHER): Payer: Medicare Other

## 2022-08-06 DIAGNOSIS — I428 Other cardiomyopathies: Secondary | ICD-10-CM

## 2022-08-07 LAB — CUP PACEART REMOTE DEVICE CHECK
Battery Remaining Longevity: 18 mo
Battery Voltage: 2.89 V
Brady Statistic AP VP Percent: 0.36 %
Brady Statistic AP VS Percent: 0.02 %
Brady Statistic AS VP Percent: 98.08 %
Brady Statistic AS VS Percent: 1.55 %
Brady Statistic RA Percent Paced: 0.37 %
Brady Statistic RV Percent Paced: 2.87 %
Date Time Interrogation Session: 20231102111637
HighPow Impedance: 108 Ohm
Implantable Lead Connection Status: 753985
Implantable Lead Connection Status: 753985
Implantable Lead Connection Status: 753985
Implantable Lead Implant Date: 20090901
Implantable Lead Implant Date: 20170210
Implantable Lead Implant Date: 20170210
Implantable Lead Location: 753858
Implantable Lead Location: 753859
Implantable Lead Location: 753860
Implantable Lead Model: 4598
Implantable Lead Model: 5076
Implantable Lead Model: 6935
Implantable Pulse Generator Implant Date: 20170210
Lead Channel Impedance Value: 1083 Ohm
Lead Channel Impedance Value: 1121 Ohm
Lead Channel Impedance Value: 1121 Ohm
Lead Channel Impedance Value: 342 Ohm
Lead Channel Impedance Value: 399 Ohm
Lead Channel Impedance Value: 418 Ohm
Lead Channel Impedance Value: 475 Ohm
Lead Channel Impedance Value: 513 Ohm
Lead Channel Impedance Value: 532 Ohm
Lead Channel Impedance Value: 589 Ohm
Lead Channel Impedance Value: 722 Ohm
Lead Channel Impedance Value: 817 Ohm
Lead Channel Impedance Value: 874 Ohm
Lead Channel Pacing Threshold Amplitude: 0.625 V
Lead Channel Pacing Threshold Amplitude: 0.75 V
Lead Channel Pacing Threshold Amplitude: 1.625 V
Lead Channel Pacing Threshold Pulse Width: 0.4 ms
Lead Channel Pacing Threshold Pulse Width: 0.4 ms
Lead Channel Pacing Threshold Pulse Width: 0.4 ms
Lead Channel Sensing Intrinsic Amplitude: 13.375 mV
Lead Channel Sensing Intrinsic Amplitude: 13.375 mV
Lead Channel Sensing Intrinsic Amplitude: 2.5 mV
Lead Channel Sensing Intrinsic Amplitude: 2.5 mV
Lead Channel Setting Pacing Amplitude: 1.5 V
Lead Channel Setting Pacing Amplitude: 2 V
Lead Channel Setting Pacing Amplitude: 2.75 V
Lead Channel Setting Pacing Pulse Width: 0.4 ms
Lead Channel Setting Pacing Pulse Width: 0.4 ms
Lead Channel Setting Sensing Sensitivity: 0.3 mV
Zone Setting Status: 755011
Zone Setting Status: 755011

## 2022-08-13 ENCOUNTER — Other Ambulatory Visit: Payer: Self-pay | Admitting: Internal Medicine

## 2022-08-19 NOTE — Progress Notes (Signed)
Remote ICD transmission.   

## 2022-08-25 ENCOUNTER — Ambulatory Visit (INDEPENDENT_AMBULATORY_CARE_PROVIDER_SITE_OTHER): Payer: Medicare Other

## 2022-08-25 DIAGNOSIS — Z95810 Presence of automatic (implantable) cardiac defibrillator: Secondary | ICD-10-CM | POA: Diagnosis not present

## 2022-08-25 DIAGNOSIS — I5022 Chronic systolic (congestive) heart failure: Secondary | ICD-10-CM | POA: Diagnosis not present

## 2022-08-26 NOTE — Progress Notes (Signed)
EPIC Encounter for ICM Monitoring  Patient Name: Gary Hutchinson is a 61 y.o. male Date: 08/26/2022 Primary Care Physican: Creola Corn, MD Primary Cardiologist: [name] Electrophysiologist: Elberta Fortis Bi-V Pacing:  97.8%        03/27/2022 Weight: 310 lbs 05/08/2022 Weight:  308 lbs 9/6/203 Weight: 308 lbs 08/26/2022 Weight: 311 lbs   Time in AT/AF  0.0 hr/day (0.0%) (taking Xarelto)                               Spoke with patient and heart failure questions reviewed.  Transmission results reviewed.  Pt reports respiratory infection over the last 2 weeks.  He has been drinking more fluids and canned soups since he has been sick.    Optivol thoracic impedance suggesting possible fluid accumulation starting 11/12.     Prescribed dosage:  Torsemide 20 mg Take 20 mg by mouth in the morning, at noon, and at bedtime. Metolazone 2.5 mg 1 tablet as needed. Spironolactone 12.5 mg take 1 tablet daily   Labs:   05/06/2022 Creatinine 1.63, BUN 40, Potassium 4.3, Sodium 138, GFr 48 07/11/2022 Creatinine 1.73, BUN 48, Potassium 3.7, Sodium 136, GFR 44 A complete set of results can be found in Results Review.   Recommendations:  He reports he will take a Metolazone to help with fluid accumulation.     Follow-up plan: ICM clinic phone appointment on 09/08/2022 to recheck fluid levels.   91 day device clinic remote transmission 11/05/2022.     EP/Cardiology Office Visits:  Recall 09/10/2022 with HF clinic PA/NP.  Recall 11/02/2022 with [facility].  Advised patient to call Dr Gala Romney and Dr Elberta Fortis office to make appt that are due in Dec/Jan.     Copy of ICM check sent to Dr. Elberta Fortis and Dr Gala Romney as Lorain Childes that pt plans to take Metolazone for fluid accumulation.    3 month ICM trend: 08/25/2022.    12-14 Month ICM trend:     Karie Soda, RN 08/26/2022 9:38 AM

## 2022-09-01 ENCOUNTER — Encounter (HOSPITAL_BASED_OUTPATIENT_CLINIC_OR_DEPARTMENT_OTHER): Payer: Self-pay

## 2022-09-01 ENCOUNTER — Emergency Department (HOSPITAL_BASED_OUTPATIENT_CLINIC_OR_DEPARTMENT_OTHER)
Admission: EM | Admit: 2022-09-01 | Discharge: 2022-09-01 | Disposition: A | Discharge status: Home / Self Care | Payer: Medicare Other | Attending: Emergency Medicine | Admitting: Emergency Medicine

## 2022-09-01 ENCOUNTER — Emergency Department (HOSPITAL_BASED_OUTPATIENT_CLINIC_OR_DEPARTMENT_OTHER): Payer: Medicare Other

## 2022-09-01 ENCOUNTER — Other Ambulatory Visit: Payer: Self-pay

## 2022-09-01 DIAGNOSIS — E039 Hypothyroidism, unspecified: Secondary | ICD-10-CM | POA: Insufficient documentation

## 2022-09-01 DIAGNOSIS — Z7951 Long term (current) use of inhaled steroids: Secondary | ICD-10-CM | POA: Diagnosis not present

## 2022-09-01 DIAGNOSIS — I5023 Acute on chronic systolic (congestive) heart failure: Secondary | ICD-10-CM | POA: Insufficient documentation

## 2022-09-01 DIAGNOSIS — R600 Localized edema: Secondary | ICD-10-CM | POA: Insufficient documentation

## 2022-09-01 DIAGNOSIS — B9689 Other specified bacterial agents as the cause of diseases classified elsewhere: Secondary | ICD-10-CM | POA: Insufficient documentation

## 2022-09-01 DIAGNOSIS — J209 Acute bronchitis, unspecified: Secondary | ICD-10-CM | POA: Diagnosis not present

## 2022-09-01 DIAGNOSIS — E119 Type 2 diabetes mellitus without complications: Secondary | ICD-10-CM | POA: Diagnosis not present

## 2022-09-01 DIAGNOSIS — Z7901 Long term (current) use of anticoagulants: Secondary | ICD-10-CM | POA: Diagnosis not present

## 2022-09-01 DIAGNOSIS — J45909 Unspecified asthma, uncomplicated: Secondary | ICD-10-CM | POA: Insufficient documentation

## 2022-09-01 DIAGNOSIS — Z7984 Long term (current) use of oral hypoglycemic drugs: Secondary | ICD-10-CM | POA: Diagnosis not present

## 2022-09-01 DIAGNOSIS — Z79899 Other long term (current) drug therapy: Secondary | ICD-10-CM | POA: Diagnosis not present

## 2022-09-01 DIAGNOSIS — Z794 Long term (current) use of insulin: Secondary | ICD-10-CM | POA: Diagnosis not present

## 2022-09-01 DIAGNOSIS — R059 Cough, unspecified: Secondary | ICD-10-CM | POA: Diagnosis present

## 2022-09-01 DIAGNOSIS — Z20822 Contact with and (suspected) exposure to covid-19: Secondary | ICD-10-CM | POA: Insufficient documentation

## 2022-09-01 LAB — COMPREHENSIVE METABOLIC PANEL
ALT: 16 U/L (ref 0–44)
AST: 25 U/L (ref 15–41)
Albumin: 3.5 g/dL (ref 3.5–5.0)
Alkaline Phosphatase: 98 U/L (ref 38–126)
Anion gap: 9 (ref 5–15)
BUN: 46 mg/dL — ABNORMAL HIGH (ref 8–23)
CO2: 24 mmol/L (ref 22–32)
Calcium: 8.6 mg/dL — ABNORMAL LOW (ref 8.9–10.3)
Chloride: 106 mmol/L (ref 98–111)
Creatinine, Ser: 1.71 mg/dL — ABNORMAL HIGH (ref 0.61–1.24)
GFR, Estimated: 45 mL/min — ABNORMAL LOW (ref >60)
Glucose, Bld: 179 mg/dL — ABNORMAL HIGH (ref 70–99)
Potassium: 3.8 mmol/L (ref 3.5–5.1)
Sodium: 139 mmol/L (ref 135–145)
Total Bilirubin: 0.6 mg/dL (ref 0.3–1.2)
Total Protein: 7.5 g/dL (ref 6.5–8.1)

## 2022-09-01 LAB — BRAIN NATRIURETIC PEPTIDE: B Natriuretic Peptide: 48.3 pg/mL (ref 0.0–100.0)

## 2022-09-01 LAB — RESP PANEL BY RT-PCR (FLU A&B, COVID) ARPGX2
Influenza A by PCR: NEGATIVE
Influenza B by PCR: NEGATIVE
SARS Coronavirus 2 by RT PCR: NEGATIVE

## 2022-09-01 LAB — CBC WITH DIFFERENTIAL/PLATELET
Abs Immature Granulocytes: 0.05 10*3/uL (ref 0.00–0.07)
Basophils Absolute: 0.1 10*3/uL (ref 0.0–0.1)
Basophils Relative: 1 %
Eosinophils Absolute: 0.7 10*3/uL — ABNORMAL HIGH (ref 0.0–0.5)
Eosinophils Relative: 12 %
HCT: 37.5 % — ABNORMAL LOW (ref 39.0–52.0)
Hemoglobin: 11.8 g/dL — ABNORMAL LOW (ref 13.0–17.0)
Immature Granulocytes: 1 %
Lymphocytes Relative: 17 %
Lymphs Abs: 1.1 10*3/uL (ref 0.7–4.0)
MCH: 26.6 pg (ref 26.0–34.0)
MCHC: 31.5 g/dL (ref 30.0–36.0)
MCV: 84.5 fL (ref 80.0–100.0)
Monocytes Absolute: 0.9 10*3/uL (ref 0.1–1.0)
Monocytes Relative: 14 %
Neutro Abs: 3.7 10*3/uL (ref 1.7–7.7)
Neutrophils Relative %: 55 %
Platelets: 180 10*3/uL (ref 150–400)
RBC: 4.44 MIL/uL (ref 4.22–5.81)
RDW: 15.2 % (ref 11.5–15.5)
WBC: 6.5 10*3/uL (ref 4.0–10.5)
nRBC: 0 % (ref 0.0–0.2)

## 2022-09-01 LAB — TROPONIN I (HIGH SENSITIVITY)
Troponin I (High Sensitivity): 19 ng/L — ABNORMAL HIGH (ref <18)
Troponin I (High Sensitivity): 20 ng/L — ABNORMAL HIGH (ref <18)

## 2022-09-01 LAB — MAGNESIUM: Magnesium: 2.5 mg/dL — ABNORMAL HIGH (ref 1.7–2.4)

## 2022-09-01 MED ORDER — IPRATROPIUM-ALBUTEROL 0.5-2.5 (3) MG/3ML IN SOLN
3.0000 mL | Freq: Once | RESPIRATORY_TRACT | Status: AC
  Administered 2022-09-01: 3 mL via RESPIRATORY_TRACT
  Filled 2022-09-01: qty 3

## 2022-09-01 MED ORDER — FUROSEMIDE 10 MG/ML IJ SOLN
80.0000 mg | Freq: Once | INTRAMUSCULAR | Status: AC
  Administered 2022-09-01: 80 mg via INTRAVENOUS
  Filled 2022-09-01: qty 8

## 2022-09-01 MED ORDER — DOXYCYCLINE HYCLATE 100 MG PO CAPS: 100.0000 mg | ORAL_CAPSULE | Freq: Two times a day (BID) | ORAL | 0 refills | Status: AC

## 2022-09-01 NOTE — ED Notes (Signed)
Medtronic pacemaker interrogated °

## 2022-09-01 NOTE — ED Notes (Signed)
Pt requested a wheelchair at discharge

## 2022-09-01 NOTE — Discharge Instructions (Addendum)
Take your metolazone for the next 2 days to help with fluid. Follow up closely with your Cardiologist and PCP.

## 2022-09-01 NOTE — ED Triage Notes (Signed)
C/o being "sick" for a few weeks with productive cough. Has been on abx since last week. Denies chest pain, c/o some shortness of breath.

## 2022-09-01 NOTE — ED Provider Notes (Signed)
[facility] EMERGENCY DEPARTMENT Provider Note   CSN: [id number] Arrival date & time: 09/01/22  0705     History  Chief Complaint  Patient presents with   Cough    Gary Hutchinson is a 61 y.o. male.  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  Has had symptoms for 3 weeks, continuing symptoms of cough, congestion, some dyspnea.  Initially, 3 weeks ago had sore throat, fever, also had nausea/vomiting. Those symptoms have improved but cough and chest congestion continued.  Called doctor and was rx augmentin and took that for 7 days with continued symptoms. Sputum was green prior to abx and now appears more yellow.  Not sure if having orthopnea, wears CPAP at night.  Took metolazone once last week after they called to say he had extra fluid, has not otherwise taken it. Thinks symptoms may be related to a bronchitis and worried about developing pneumonia.  Does feel a little bit similar to when fluid has been increased in the past.  Does not have leg swelling. No recent fevers. Chest feels tight, some wheezing. Has used inhaler twice since he has been sick without much relief. 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  Has had some stomach issues, constipation since starting ozempic.  No ear pain, facial pain, nor continued sore throat.  Has been taking xarelto with exception of episodes of vomiting he had the other week.     Home Medications Prior to Admission medications   Medication Sig Start Date End Date Taking? Authorizing Provider  doxycycline (VIBRAMYCIN) 100 MG capsule Take 1 capsule (100 mg  total) by mouth 2 (two) times daily for 7 days. 09/01/22 09/08/22 Yes Alvira Monday, MD  acetaminophen (TYLENOL) 500 MG tablet Take 500 mg by mouth every 6 (six) hours as needed for moderate pain or headache.     [provider]  albuterol (VENTOLIN HFA) 108 (90 Base) MCG/ACT inhaler Inhale 2 puffs into the lungs every 6 (six) hours as needed for wheezing or shortness of breath.    [provider]  allopurinol (ZYLOPRIM) 100 MG tablet Take 100 mg by mouth daily. 12/28/20   [provider]  atorvastatin (LIPITOR) 80 MG tablet Take 80 mg by mouth at bedtime.    [provider]  colchicine 0.6 MG tablet Take 0.6 mg by mouth daily as needed (for gout flares).     [provider]  dapagliflozin propanediol (FARXIGA) 10 MG TABS tablet Take 10 mg by mouth daily.    [provider]  digoxin (LANOXIN) 0.125 MG tablet Take 0.5 tablets by mouth every other day. 03/15/13   [provider]  digoxin (LANOXIN) 0.25 MG tablet TAKE 1/2 TABLET BY MOUTH EVERY OTHER DAY 06/03/22   [name], Bevelyn Buckles, MD  ENTRESTO 97-103 MG TAKE 1 TABLET BY MOUTH TWICE A DAY 01/22/22   [name], Bevelyn Buckles, MD  Fluticasone-Salmeterol (ADVAIR) 250-50 MCG/DOSE AEPB Inhale 1 puff into the lungs every 12 (twelve) hours as needed (for flares/shortness of breath). Reported on 02/06/2016    [provider]  hydrALAZINE (APRESOLINE) 25 MG tablet TAKE 1.5 TABLETS (37.5 MG TOTAL) BY MOUTH 3 (THREE) TIMES DAILY. 11/29/21  [name], Bevelyn Buckles, MD  insulin detemir (LEVEMIR) 100 UNIT/ML injection Inject 40 Units into the skin at bedtime.  03/13/14   Aundria Rud, CRNA  insulin lispro (HUMALOG) 100 UNIT/ML injection Inject 10 Units into the skin See admin instructions. 4 to 5 times daily with a meal, depending on how many meals are eaten that day.    [provider]  isosorbide mononitrate (IMDUR) 30 MG 24 hr tablet Take 1 tablet (30 mg total) by mouth in the morning and at  bedtime. 03/25/22   [place], Anderson Malta, FNP  metolazone (ZAROXOLYN) 2.5 MG tablet Take 2.5 mg by mouth daily as needed (for edema).    [provider]  montelukast (SINGULAIR) 10 MG tablet Take 10 mg by mouth as needed (allergies).    [provider]  ondansetron (ZOFRAN-ODT) 8 MG disintegrating tablet Take 1 tablet (8 mg total) by mouth every 8 (eight) hours as needed for nausea or vomiting. 03/23/21   Koleen Distance, MD  OZEMPIC, 1 MG/DOSE, 4 MG/3ML SOPN Inject 1 mg into the skin once a week. 02/26/22   [provider]  sotalol (BETAPACE) 80 MG tablet Take 1 tablet (80 mg total) by mouth 2 (two) times daily. 08/13/22   Graciella Freer, [name]  spironolactone (ALDACTONE) 25 MG tablet Take 12.5 mg by mouth daily.  10/28/15   [provider]  torsemide (DEMADEX) 20 MG tablet TAKE 2 TABLETS (40 MG TOTAL) BY MOUTH EVERY MORNING AND 1 TABLET (20 MG TOTAL) AT BEDTIME. 05/19/22   [name], Bevelyn Buckles, MD  XARELTO 20 MG TABS tablet TAKE 1 TABLET BY MOUTH DAILY WITH SUPPER. 12/30/21   [name], Bevelyn Buckles, MD      Allergies    Patient has no known allergies.    Review of Systems   Review of Systems  Physical Exam Updated Vital Signs BP (!) 101/50   Pulse 81   Temp 98.9 F (37.2 C)   Resp (!) 26   Ht 5\' 5"  (1.651 m)   Wt 134.7 kg   SpO2 93%   BMI 49.42 kg/m  Physical Exam Vitals and nursing note reviewed.  Constitutional:      General: He is not in acute distress.    Appearance: He is well-developed. He is not diaphoretic.  HENT:     Head: Normocephalic and atraumatic.  Eyes:     Conjunctiva/sclera: Conjunctivae normal.  Cardiovascular:     Rate and Rhythm: Normal rate and regular rhythm.     Heart sounds: Normal heart sounds. No murmur heard.    No friction rub. No gallop.  Pulmonary:     Effort: Pulmonary effort is normal. No respiratory distress.     Breath sounds: Wheezing present. No rales.  Abdominal:     General: There is no distension.      Palpations: Abdomen is soft.     Tenderness: There is no abdominal tenderness. There is no guarding.  Musculoskeletal:     Cervical back: Normal range of motion.     Right lower leg: Edema present.     Left lower leg: Edema present.  Skin:    General: Skin is warm and dry.  Neurological:     Mental Status: He is alert and oriented to person, place, and time.     ED Results / Procedures / Treatments   Labs (all labs ordered are listed, but only abnormal results are displayed) Labs Reviewed  CBC WITH DIFFERENTIAL/PLATELET - Abnormal; Notable for the following components:  Result Value   Hemoglobin 11.8 (*)    HCT 37.5 (*)    Eosinophils Absolute 0.7 (*)    All other components within normal limits  COMPREHENSIVE METABOLIC PANEL - Abnormal; Notable for the following components:   Glucose, Bld 179 (*)    BUN 46 (*)    Creatinine, Ser 1.71 (*)    Calcium 8.6 (*)    GFR, Estimated 45 (*)    All other components within normal limits  MAGNESIUM - Abnormal; Notable for the following components:   Magnesium 2.5 (*)    All other components within normal limits  TROPONIN I (HIGH SENSITIVITY) - Abnormal; Notable for the following components:   Troponin I (High Sensitivity) 19 (*)    All other components within normal limits  TROPONIN I (HIGH SENSITIVITY) - Abnormal; Notable for the following components:   Troponin I (High Sensitivity) 20 (*)    All other components within normal limits  RESP PANEL BY RT-PCR (FLU A&B, COVID) [id number]  BRAIN NATRIURETIC PEPTIDE    EKG EKG Interpretation  Date/Time:  Monday September 01 2022 07:28:08 EST Ventricular Rate:  82 PR Interval:    QRS Duration: 140 QT Interval:  412 QTC Calculation: 482 R Axis:   267 Text Interpretation: VENTRICULAR PACED RHYTHM No significant change since last tracing Confirmed by Alvira Monday ([id number]) on 09/01/2022 7:43:24 AM  Radiology DG Chest 2 View  Result Date: 09/01/2022 CLINICAL DATA:  productive  cough/shortness of breath EXAM: PORTABLE CHEST 1 VIEW COMPARISON:  03/23/2021 FINDINGS: Cardiac silhouette is prominent. There is evidence of interstitial edema with prominence of pulmonary vasculature. No focal consolidation. No pneumothorax or pleural effusion identified. There is a left-sided pacemaker. IMPRESSION: Findings suggest CHF. Electronically Signed   By: Layla Maw M.D.   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  Differential diagnosis for dyspnea includes ACS, PE, COPD exacerbation, CHF exacerbation, anemia, pneumonia, viral etiology such as COVID 19 infection, metabolic abnormality.    Chest x-ray was done and personally evaluated by me which showed interstitial edema, consistent with CHF.   EKG was personally evaluated by me which showed no change in comparison to prior.    Labs completed and personally evalauted by me show no clinically significant anemia, no signs of DKA.  WBC WNL.  BNP 48, decreased from prior but may be underestimate.  Troponin mildly elevated likely secondary to CHF, CKD.  COVID/flu testing negative.  Suspect symptoms secondary to fluid overload, however also having cough productive of yellow sputum, occasional wheeze and consider bacterial bronchitis in addition to fluid overload. Given  rx for doxycycline. Given lasix 80mg  in ED, recommend taking metolazone for next 2 days, following up with PCP and Cardiology.  Patient discharged in stable condition with understanding of reasons to return.  Final Clinical Impression(s) / ED Diagnoses Final diagnoses:  Acute on chronic systolic congestive heart failure (HCC)  Acute bacterial bronchitis    Rx / DC Orders ED Discharge Orders          Ordered    doxycycline (VIBRAMYCIN) 100 MG capsule  2 times daily        09/01/22 1107              Alvira Monday, MD 09/01/22 2347

## 2022-09-01 NOTE — ED Notes (Signed)
ED Provider at bedside. 

## 2022-09-08 ENCOUNTER — Ambulatory Visit (INDEPENDENT_AMBULATORY_CARE_PROVIDER_SITE_OTHER): Payer: Medicare Other

## 2022-09-08 DIAGNOSIS — Z95810 Presence of automatic (implantable) cardiac defibrillator: Secondary | ICD-10-CM

## 2022-09-08 DIAGNOSIS — I5022 Chronic systolic (congestive) heart failure: Secondary | ICD-10-CM

## 2022-09-08 NOTE — Progress Notes (Signed)
EPIC Encounter for ICM Monitoring  Patient Name: Gary Hutchinson is a 61 y.o. male Date: 09/08/2022 Primary Care Physican: Creola Corn, MD Primary Cardiologist: [name] Electrophysiologist: Elberta Fortis Bi-V Pacing:  97.0%        03/27/2022 Weight: 310 lbs 05/08/2022 Weight:  308 lbs 9/6/203 Weight: 308 lbs 08/26/2022 Weight: 311 lbs   Time in AT/AF  0.0 hr/day (0.0%) (taking Xarelto)                               Spoke with patient and heart failure questions reviewed.  Transmission results reviewed.  He is feeling better since ED visit.  ED visit 11/27 with dx of fluid accumulation and respiratory infection.    Optivol thoracic impedance suggesting fluid levels returned to normal as results of 11/27 ED visit.  Per 11/27 ED note, he received lasix 80mg  in ED, then recommend taking metolazone for next 2 days.     Prescribed dosage:  Torsemide 20 mg Take 2 tablets (40 mg total) by mouth every morning and 1 tablet (20 total) at bedtime. Metolazone 2.5 mg 1 tablet as needed. Spironolactone 12.5 mg take 1 tablet daily   Labs:   09/01/2022 Creatinine 1.71, BUN 46, Potassium 3.8, Sodium 139, GFR 45 05/06/2022 Creatinine 1.63, BUN 40, Potassium 4.3, Sodium 138, GFr 48 07/11/2022 Creatinine 1.73, BUN 48, Potassium 3.7, Sodium 136, GFR 44 A complete set of results can be found in Results Review.   Recommendations:  No changes and encouraged to call if experiencing any fluid symptoms.   Follow-up plan: ICM clinic phone appointment on 10/13/2022.   91 day device clinic remote transmission 11/05/2022.     EP/Cardiology Office Visits:  10/27/2022 with Dr 10/29/2022.  Recall 11/02/2022 with [facility].  Advised patient to call Dr 11/04/2022 office to make appt that are due in Dec/Jan.     Copy of ICM check sent to Dr. 10-27-2003   3 month ICM trend: 09/08/2022.    12-14 Month ICM trend:     14/01/2022, RN 09/08/2022 12:39 PM

## 2022-10-13 ENCOUNTER — Ambulatory Visit (INDEPENDENT_AMBULATORY_CARE_PROVIDER_SITE_OTHER): Payer: 59

## 2022-10-13 ENCOUNTER — Telehealth: Payer: Self-pay

## 2022-10-13 DIAGNOSIS — I5022 Chronic systolic (congestive) heart failure: Secondary | ICD-10-CM | POA: Diagnosis not present

## 2022-10-13 DIAGNOSIS — Z95810 Presence of automatic (implantable) cardiac defibrillator: Secondary | ICD-10-CM | POA: Diagnosis not present

## 2022-10-13 NOTE — Telephone Encounter (Addendum)
Spoke with Dr. Curt Bears. Would like to move patient's appt up with either Andy/Renee or AF clinic.  Will continue to try and reach patient.   Appt this Friday, 10/17/22 with Jonni Sanger at [facility].

## 2022-10-13 NOTE — Telephone Encounter (Addendum)
Patient's device noted 42 AT/AF events since January 1st. Appears mostly A flutter with controlled V-rates However, appears impacting BiV effective pacing rate.  Which is now down to 85.6%.  Also, contributing is an increase in Heart [employer] and Optivol readings. Sharman Cheek, RN is following with patient.  PVC burden is down.  Patient is scheduled to see Dr. Curt Bears on 11/17/22.  LM on patient's VM to call the device clinic.  Need to assess if patient is sx or has had any changes.

## 2022-10-13 NOTE — Telephone Encounter (Signed)
-----   Message from [name], RN sent at 10/13/2022 10:47 AM EST ----- Regarding: AT/AF Good morning,  This is a [facility] patient.  Pts AT/AF went from 0% on 12/4 to 19.2% and BiV pacing dropped from 97% to 85.2% within the same time frame.    He is on Xarelto.  Would you review [facility] report and discuss with [facility] if you think it is necessary.   Thanks, Margarita Grizzle

## 2022-10-13 NOTE — Progress Notes (Unsigned)
EPIC Encounter for ICM Monitoring  Patient Name: Gary Hutchinson is a 62 y.o. male Date: 10/13/2022 Primary Care Physican: Shon Baton, MD Primary Cardiologist: [place] Electrophysiologist: Curt Bears Bi-V Pacing: 85.2% (dropped from 97.0% on 09/08/22 report)     03/27/2022 Weight: 310 lbs 05/08/2022 Weight:  308 lbs 9/6/203 Weight: 308 lbs 08/26/2022 Weight: 311 lbs  Since 08-Sep-2022  AT/AF 42 Time in AT/AF 4.6 hr/day (19.2%) (was 0.0 hr/day (0.0%) on 09/08/22 report) Longest AT/AF 42 hours                               Spoke with patient and heart failure questions reviewed.  Transmission results reviewed.  Pt reports he drinks more than 64 oz daily which may contribute to decreased impedance.       Optivol thoracic impedance suggesting possible fluid accumulation starting 12/27 and returned to normal 10/13/22.  Also suggesting possible fluid accumulation from 12/17-12/24.  Appears AT/AF started 10/06/2022.  Message sent to device clinic triage for review.     Prescribed dosage:  Torsemide 20 mg Take 2 tablets (40 mg total) by mouth every morning and 1 tablet (20 total) at bedtime. Metolazone 2.5 mg 1 tablet as needed. Spironolactone 12.5 mg take 1 tablet daily   Labs:   09/01/2022 Creatinine 1.71, BUN 46, Potassium 3.8, Sodium 139, GFR 45 05/06/2022 Creatinine 1.63, BUN 40, Potassium 4.3, Sodium 138, GFr 48 07/11/2022 Creatinine 1.73, BUN 48, Potassium 3.7, Sodium 136, GFR 44 A complete set of results can be found in Results Review.   Recommendations:  Recommendation to limit salt intake to 2000 mg daily and fluid intake to 64 oz daily.   10/17/22 OV scheduled with [name], PA for increased AT/AF burden.   Follow-up plan: ICM clinic phone appointment on 10/20/2022 to recheck fluid levels and AT/AF Burden.   91 day device clinic remote transmission 11/05/2022.     EP/Cardiology Office Visits:  10/27/2022 with Dr Haroldine Laws.  Recall 11/02/2022 with Camnitz.  10/17/2022 with [name], PA  for AT/AF.     Copy of ICM check sent to Dr. Curt Bears.  3 month ICM trend: 10/13/2022.    12-14 Month ICM trend:     [name], RN 10/13/2022 10:37 AM

## 2022-10-15 NOTE — Telephone Encounter (Signed)
Follow up scheduled.  Scheduler will call Pt today to confirm he is aware of appt.

## 2022-10-16 NOTE — Progress Notes (Signed)
Cardiology Office Note Date:  10/17/2022  Patient ID:  Gary Hutchinson, DOB Apr 07, 1961, MRN [id number] PCP:  Shon Baton, MD  Cardiologist:  None HF cardiologist: Glori Bickers, MD Electrophysiologist: Thompson Grayer, MD >> Will Meredith Leeds, MD   Chief Complaint: reduced BiV pacing   History of Present Illness: Gary Hutchinson is a 62 y.o. male with PMH notable for HFrEF, LBBB s/p CRT-D, VT, parox Afib, HTN; seen today for Will Meredith Leeds, MD for acute visit due to reduced BiV pacing on most recent remote device transmission.    Last saw HF 03/2022, doing well, no medication changes.  Last saw PA [place] 05/2022, doing well. PCP adjusting ozempic doses d/t GI distress Went to ER 09/01/2022 with cough, congestion, fluid overload.   He states that around the time of his ER visit, he was feeling very poorly. Had some nausea and was not able to take medications for about 2 days. Today, he is feeling well, back to baseline activity level. Taking medications diligently. At dry weight of ~309/310lbs.  No SOB, denies chest pain, palpitations, dyspnea, PND, orthopnea, nausea, vomiting, dizziness, syncope, edema, weight gain, or early satiety.    Device Information: MDT CRT-D, impl 2017; dx CHF + inappropriate shock for rapid atypical Aflutter in sitting of influenza + inappropriate shock for 1:1 aflutter 09/2017  AAD History: Amiodarone stopped d/t thyrotoxicity Sotalol initiated 09/2017  Past Medical History:  Diagnosis Date   AICD (automatic cardioverter/defibrillator) present    Medtronic   Alcohol abuse    now quit   Anemia    iron defi   Arthritis    "fingers, knees, some days all my joints ache" (11/16/2015)   Asthma    Cholecystitis    Chronic systolic congestive heart failure ([place])    a. RHC (02/2011) RA 31/27, RV 71/27, PA67/45, PCWP 46, PA 49%, Fick CO: 3.4 b. ECHO (03/2014) EF 30-35%, grade II DD, mild MR, RV poorly visualized appears mildly decreased c. RHC  (05/2014) RA 13, RV 54/4/11, PA 60/22 (37), PCWP 17, Fick CO/CI: 4.4 / 1.9, Thermo CO/CI: 3.8 /1.6, PVR 5.2 WU, PA 57% and 59%   Diverticulosis of colon    Gout    Hemorrhoids, internal    Hyperlipidemia    Hypertension    Morbid obesity ([place])    Nonischemic cardiomyopathy ([place])    a. LHC (02/2011) normal coronaries   OSA on CPAP    Paroxysmal atrial fibrillation (H[facility])    Pneumonia 2000s X 1   Pulmonary hypertension (H[facility])    Renal insufficiency    Type II diabetes mellitus ([place])    Ventricular tachycardia [facility])    s/p MDT ICD implant    Past Surgical History:  Procedure Laterality Date   CARDIAC CATHETERIZATION  03/08/2004   EF 25-30%   CARDIOVERSION N/A 10/17/2015   Procedure: CARDIOVERSION;  Surgeon: Thayer Headings, MD;  Location: [facility];  Service: Cardiovascular;  Laterality: N/A;   EP IMPLANTABLE DEVICE N/A 11/16/2015   Procedure: BiVI Upgrade;  Surgeon: Thompson Grayer, MD;  Medtronic Viva Quad XT   INSERTION OF ICD  06/2008   ICD- Medtronic    RIGHT HEART CATHETERIZATION N/A 05/23/2014   Procedure: RIGHT HEART CATH;  Surgeon: Jolaine Artist, MD;  Location[facility] C[facility] LAB;  Service: Cardiovascular;  Laterality: N/A;   TRANSTHORACIC ECHOCARDIOGRAM  05/27/2008   EF 30-35%   US ECHOCARDIOGRAPHY  08/22/2008   EF 30-35%    Current Outpatient Medications  Medication Instructions   acetaminophen (  TYLENOL) 500 mg, Oral, Every 6 hours PRN   albuterol (VENTOLIN HFA) 108 (90 Base) MCG/ACT inhaler 2 puffs, Inhalation, Every 6 hours PRN   allopurinol (ZYLOPRIM) 100 mg, Oral, Daily   atorvastatin (LIPITOR) 80 mg, Oral, Daily at bedtime   colchicine 0.6 mg, Oral, Daily PRN   dapagliflozin propanediol (FARXIGA) 10 mg, Oral, Daily   digoxin (LANOXIN) 0.125 MG tablet 0.5 tablets, Oral, Every other day   digoxin (LANOXIN) 0.25 MG tablet TAKE 1/2 TABLET BY MOUTH EVERY OTHER DAY   ENTRESTO 97-103 MG TAKE 1 TABLET BY MOUTH TWICE A DAY   Fluticasone-Salmeterol (ADVAIR) 250-50 MCG/DOSE AEPB  1 puff, Inhalation, Every 12 hours PRN, Reported on 02/06/2016   hydrALAZINE (APRESOLINE) 25 MG tablet TAKE 1.5 TABLETS (37.5 MG TOTAL) BY MOUTH 3 (THREE) TIMES DAILY.   insulin detemir (LEVEMIR) 40 Units, Subcutaneous, Daily at bedtime   insulin lispro (HUMALOG) 10 Units, Subcutaneous, See admin instructions, 4 to 5 times daily with a meal, depending on how many meals are eaten that day.   isosorbide mononitrate (IMDUR) 30 mg, Oral, 2 times daily   metolazone (ZAROXOLYN) 2.5 mg, Oral, Daily PRN   montelukast (SINGULAIR) 10 mg, Oral, As needed   ondansetron (ZOFRAN-ODT) 8 mg, Oral, Every 8 hours PRN   Ozempic (1 MG/DOSE) 1 mg, Subcutaneous, Weekly   sotalol (BETAPACE) 80 mg, Oral, 2 times daily   spironolactone (ALDACTONE) 12.5 mg, Oral, Daily   torsemide (DEMADEX) 20 MG tablet TAKE 2 TABLETS (40 MG TOTAL) BY MOUTH EVERY MORNING AND 1 TABLET (20 MG TOTAL) AT BEDTIME.   XARELTO 20 MG TABS tablet TAKE 1 TABLET BY MOUTH DAILY WITH SUPPER.      Social History:  The patient  reports that he has never smoked. He has never used smokeless tobacco. He reports that he does not currently use alcohol. He reports that he does not use drugs.   Family History:  The patient's family history includes CAD in an other family member; Cancer in his father; Diabetes in an other family member; Hypertension in his mother.  ROS:  Please see the history of present illness. All other systems are reviewed and otherwise negative.   PHYSICAL EXAM:  VS:  BP 98/62   Pulse 75   Ht 5\' 5"  (1.651 m)   Wt (!) 313 lb (142 kg)   SpO2 98%   BMI 52.09 kg/m  BMI: Body mass index is 52.09 kg/m.  GEN- The patient is well appearing, alert and oriented x 3 today.   HEENT: normocephalic, atraumatic; sclera clear, conjunctiva pink; hearing intact; oropharynx clear; neck supple, no JVP Lungs- Clear to ausculation bilaterally, normal work of breathing.  No wheezes, rales, rhonchi Heart- Regular rate and rhythm, no murmurs, rubs or  gallops, PMI not laterally displaced GI- soft, non-tender, non-distended, bowel sounds present, no hepatosplenomegaly Extremities- No peripheral edema. no clubbing or cyanosis; DP/PT/radial pulses 2+ bilaterally MS- no significant deformity or atrophy Skin- warm and dry, no rash or lesion, device pocket well-healed Psych- euthymic mood, full affect Neuro- strength and sensation are intact   Device interrogation done today and reviewed by myself:  Battery good Lead thresholds, impedence, sensing stable  Increase AT/AF burden correlates with decreased V-pacing, though has improved as of late Optivol back to baseline No changes made today  EKG is ordered. Personal review of EKG from today shows: A-sensed, v-paced; rate 75bpm  Recent Labs: 09/01/2022: ALT 16; B Natriuretic Peptide 48.3; BUN 46; Creatinine, Ser 1.71; Hemoglobin 11.8; Magnesium 2.5; Platelets  180; Potassium 3.8; Sodium 139  No results found for requested labs within last 365 days.   CrCl cannot be calculated (Patient's most recent lab result is older than the maximum 21 days allowed.).   Wt Readings from Last 3 Encounters:  10/17/22 (!) 313 lb (142 kg)  09/01/22 297 lb (134.7 kg)  06/17/22 (!) 310 lb (140.6 kg)     Additional studies reviewed include: Previous EP, cardiology notes.   TTE 03/11/2022  1. Left ventricular ejection fraction by 3D volume is 32 %. The left ventricle has moderately decreased function. The left ventricle demonstrates global hypokinesis. There is mild concentric left ventricular hypertrophy. Left ventricular diastolic parameters are consistent with Grade I diastolic dysfunction (impaired relaxation). The average left ventricular global longitudinal strain is -11.4 %. The global longitudinal strain is abnormal.   2. Right ventricular systolic function is mildly reduced. The right ventricular size is normal. Tricuspid regurgitation signal is inadequate for assessing PA pressure.   3. The mitral  valve is normal in structure. Moderate mitral valve regurgitation. No evidence of mitral stenosis.   4. The aortic valve is normal in structure. Aortic valve regurgitation is not visualized. No aortic stenosis is present.   5. The inferior vena cava is normal in size with greater than 50% respiratory variability, suggesting right atrial pressure of 3 mmHg.   ASSESSMENT AND PLAN:  #) chronic systolic dysfunction Feeling well today with no concerning s/s Back to taking medications regularly On GDMT - entresto, spiro, sotalol, faxiga; diuretic: torsemide + metolazone PRN Took dig this AM - updated labs today  #) s/p MDT CRT-D Reduced BiV pacing within last 4-6 weeks by device Previously had high v-pacing Reduction in v-pacing correlates to incrased AT/AF and patient's sickness with missing medication doses No changes to device settings - will see patient back in clinic in 3 months to closely monitor  #) H/o VT No recurrence, continue sotalol  #) HTN At goal today.  Recommend checking blood pressures 1-2 times per week at home and recording the values.  Recommend bringing these recordings to the primary care physician.  #) parox Afib  Hypercoag d/t AF Had increased burden by device over the last month that correlates to pt's sickness and increased fluid accumulation  CHA2DS2-VASc Score = 3 [CHF History: 1, HTN History: 1, Diabetes History: 1, Stroke History: 0, Vascular Disease History: 0, Age Score: 0, Gender Score: 0].  Therefore, the patient's annual risk of stroke is 3.2 %. OAC - xarelto 20mg  daily, appropriately dosed for CrCl 91 - updated labs  Current medicines are reviewed at length with the patient today.   The patient does not have concerns regarding his medicines.  The following changes were made today:  none  Labs/ tests ordered today include:  Orders Placed This Encounter  Procedures   Basic metabolic panel   CBC   Magnesium   Pro b natriuretic peptide (BNP)   EKG  12-Lead     Disposition: Follow up with EP APP in in 3 months   Signed, , NP  10/17/22 9:07 AM   [facility] [address] [place] [phone] (office)  [phone] (fax)

## 2022-10-17 ENCOUNTER — Ambulatory Visit: Payer: 59 | Attending: Cardiology | Admitting: Cardiology

## 2022-10-17 ENCOUNTER — Encounter: Payer: Self-pay | Admitting: Student

## 2022-10-17 VITALS — BP 98/62 | HR 75 | Ht 65.0 in | Wt 313.0 lb

## 2022-10-17 DIAGNOSIS — I480 Paroxysmal atrial fibrillation: Secondary | ICD-10-CM | POA: Diagnosis not present

## 2022-10-17 DIAGNOSIS — Z95810 Presence of automatic (implantable) cardiac defibrillator: Secondary | ICD-10-CM | POA: Diagnosis not present

## 2022-10-17 DIAGNOSIS — D6869 Other thrombophilia: Secondary | ICD-10-CM

## 2022-10-17 DIAGNOSIS — I447 Left bundle-branch block, unspecified: Secondary | ICD-10-CM

## 2022-10-17 DIAGNOSIS — I5022 Chronic systolic (congestive) heart failure: Secondary | ICD-10-CM

## 2022-10-17 DIAGNOSIS — I10 Essential (primary) hypertension: Secondary | ICD-10-CM

## 2022-10-17 LAB — CUP PACEART INCLINIC DEVICE CHECK
Battery Remaining Longevity: 16 mo
Battery Voltage: 2.88 V
Brady Statistic AP VP Percent: 0.21 %
Brady Statistic AP VS Percent: 0.01 %
Brady Statistic AS VP Percent: 86.49 %
Brady Statistic AS VS Percent: 13.29 %
Brady Statistic RA Percent Paced: 0.22 %
Brady Statistic RV Percent Paced: 8.54 %
Date Time Interrogation Session: 20240112092943
HighPow Impedance: 74 Ohm
Implantable Lead Connection Status: 753985
Implantable Lead Connection Status: 753985
Implantable Lead Connection Status: 753985
Implantable Lead Implant Date: 20090901
Implantable Lead Implant Date: 20170210
Implantable Lead Implant Date: 20170210
Implantable Lead Location: 753858
Implantable Lead Location: 753859
Implantable Lead Location: 753860
Implantable Lead Model: 4598
Implantable Lead Model: 5076
Implantable Lead Model: 6935
Implantable Pulse Generator Implant Date: 20170210
Lead Channel Impedance Value: 1064 Ohm
Lead Channel Impedance Value: 1083 Ohm
Lead Channel Impedance Value: 1083 Ohm
Lead Channel Impedance Value: 342 Ohm
Lead Channel Impedance Value: 361 Ohm
Lead Channel Impedance Value: 399 Ohm
Lead Channel Impedance Value: 475 Ohm
Lead Channel Impedance Value: 475 Ohm
Lead Channel Impedance Value: 513 Ohm
Lead Channel Impedance Value: 589 Ohm
Lead Channel Impedance Value: 703 Ohm
Lead Channel Impedance Value: 817 Ohm
Lead Channel Impedance Value: 874 Ohm
Lead Channel Pacing Threshold Amplitude: 0.75 V
Lead Channel Pacing Threshold Amplitude: 0.875 V
Lead Channel Pacing Threshold Amplitude: 1.5 V
Lead Channel Pacing Threshold Pulse Width: 0.4 ms
Lead Channel Pacing Threshold Pulse Width: 0.4 ms
Lead Channel Pacing Threshold Pulse Width: 0.4 ms
Lead Channel Sensing Intrinsic Amplitude: 11.125 mV
Lead Channel Sensing Intrinsic Amplitude: 12.875 mV
Lead Channel Sensing Intrinsic Amplitude: 2 mV
Lead Channel Sensing Intrinsic Amplitude: 2.5 mV
Lead Channel Setting Pacing Amplitude: 1.5 V
Lead Channel Setting Pacing Amplitude: 2 V
Lead Channel Setting Pacing Amplitude: 2.5 V
Lead Channel Setting Pacing Pulse Width: 0.4 ms
Lead Channel Setting Pacing Pulse Width: 0.4 ms
Lead Channel Setting Sensing Sensitivity: 0.3 mV
Zone Setting Status: 755011
Zone Setting Status: 755011

## 2022-10-17 NOTE — Patient Instructions (Signed)
Medication Instructions:  Your physician recommends that you continue on your current medications as directed. Please refer to the Current Medication list given to you today.  *If you need a refill on your cardiac medications before your next appointment, please call your pharmacy*   Lab Work: TODAY: BMET, Mag, CBC, BNP  If you have labs (blood work) drawn today and your tests are completely normal, you will receive your results only by: [place] (if you have MyChart) OR A paper copy in the mail If you have any lab test that is abnormal or we need to change your treatment, we will call you to review the results.   Follow-Up: At [facility], you and your health needs are our priority.  As part of our continuing mission to provide you with exceptional heart care, we have created designated Provider Care Teams.  These Care Teams include your primary Cardiologist (physician) and Advanced Practice Providers (APPs -  Physician Assistants and Nurse Practitioners) who all work together to provide you with the care you need, when you need it.  Your next appointment:   3 month(s)  Provider:   You will see one of the following Advanced Practice Providers on your designated Care Team:   Tommye Standard, [place] "[facility]" [place], [place]

## 2022-10-18 LAB — CBC
Hematocrit: 35.9 % — ABNORMAL LOW (ref 37.5–51.0)
Hemoglobin: 11.7 g/dL — ABNORMAL LOW (ref 13.0–17.7)
MCH: 26.7 pg (ref 26.6–33.0)
MCHC: 32.6 g/dL (ref 31.5–35.7)
MCV: 82 fL (ref 79–97)
Platelets: 213 10*3/uL (ref 150–450)
RBC: 4.38 x10E6/uL (ref 4.14–5.80)
RDW: 13.9 % (ref 11.6–15.4)
WBC: 7.3 10*3/uL (ref 3.4–10.8)

## 2022-10-18 LAB — PRO B NATRIURETIC PEPTIDE: NT-Pro BNP: 231 pg/mL — ABNORMAL HIGH (ref 0–210)

## 2022-10-18 LAB — BASIC METABOLIC PANEL
BUN/Creatinine Ratio: 25 — ABNORMAL HIGH (ref 10–24)
BUN: 47 mg/dL — ABNORMAL HIGH (ref 8–27)
CO2: 23 mmol/L (ref 20–29)
Calcium: 9.4 mg/dL (ref 8.6–10.2)
Chloride: 102 mmol/L (ref 96–106)
Creatinine, Ser: 1.9 mg/dL — ABNORMAL HIGH (ref 0.76–1.27)
Glucose: 187 mg/dL — ABNORMAL HIGH (ref 70–99)
Potassium: 4.3 mmol/L (ref 3.5–5.2)
Sodium: 141 mmol/L (ref 134–144)
eGFR: 40 mL/min/{1.73_m2} — ABNORMAL LOW (ref >59)

## 2022-10-18 LAB — MAGNESIUM: Magnesium: 2.6 mg/dL — ABNORMAL HIGH (ref 1.6–2.3)

## 2022-10-20 ENCOUNTER — Ambulatory Visit (INDEPENDENT_AMBULATORY_CARE_PROVIDER_SITE_OTHER): Payer: 59

## 2022-10-20 DIAGNOSIS — I5022 Chronic systolic (congestive) heart failure: Secondary | ICD-10-CM

## 2022-10-20 DIAGNOSIS — Z95810 Presence of automatic (implantable) cardiac defibrillator: Secondary | ICD-10-CM

## 2022-10-20 NOTE — Progress Notes (Signed)
EPIC Encounter for ICM Monitoring  Patient Name: Gary Hutchinson is a 62 y.o. male Date: 10/20/2022 Primary Care Physican: Shon Baton, MD Primary Cardiologist: [place] Electrophysiologist: Curt Bears Bi-V Pacing: 97.9%    03/27/2022 Weight: 310 lbs 05/08/2022 Weight:  308 lbs 9/6/203 Weight: 308 lbs 08/26/2022 Weight: 311 lbs   Since 17-Oct-2022  Time in AT/AF  0.0 hr/day (0.0%)                     Spoke with patient and heart failure questions reviewed.  Transmission results reviewed.  Pt is feeling better and doing well.       Optivol thoracic impedance suggesting fluid levels returned to normal.  Appears AT/AF started 1/1-1/8.     Prescribed dosage:  Torsemide 20 mg Take 2 tablets (40 mg total) by mouth every morning and 1 tablet (20 total) at bedtime. Metolazone 2.5 mg 1 tablet as needed. Spironolactone 12.5 mg take 1 tablet daily   Labs:   09/01/2022 Creatinine 1.71, BUN 46, Potassium 3.8, Sodium 139, GFR 45 05/06/2022 Creatinine 1.63, BUN 40, Potassium 4.3, Sodium 138, GFr 48 07/11/2022 Creatinine 1.73, BUN 48, Potassium 3.7, Sodium 136, GFR 44 A complete set of results can be found in Results Review.   Recommendations:  No changes and encouraged to call if experiencing any fluid symptoms.   Follow-up plan: ICM clinic phone appointment on 11/17/2022.   91 day device clinic remote transmission 11/05/2022.     EP/Cardiology Office Visits:  10/27/2022 with Dr Haroldine Laws.  Recall 11/02/2022 with Camnitz.  01/19/2023 with [name], PA for AT/AF.     Copy of ICM check sent to Dr. Curt Bears.  3 month ICM trend: 10/20/2022.    12-14 Month ICM trend:     [name], RN 10/20/2022 8:12 AM

## 2022-10-26 NOTE — Progress Notes (Signed)
Patient ID: Gary Hutchinson, male   DOB: 09/06/1961, 62 y.o.   MRN: [id number]    Advanced Heart Failure Clinic Note   PCP: Creola Corn EP: [facility] HF Cardiology: Dr. Gala Romney  HPI: Gary Hutchinson is a 62 y.o. male with a history of chronic systolic heart failure due to NICM s/p ICD, HTN, DM II, OSA on CPAP, history of ventricular tachycardia status post AICD placement in 2009, atrial fibrillation and morbid obesity.    LHC 5/12 No CAD  Admitted 6/15 for A/C HF and cardiogenic shock (co-ox 42%). Diuresed with IV lasix and milrinone which were weaned off.  He went into Afib with RVR and chemically cardioverted back to NSR with IV amiodarone.   RHC 8/15: Left sided filling pressures well compensated, mild pulm HTN with elevated right-sided pressures and mod/severely depressed CO. Discussion about trial of milrinone but patient wanted to hold off.  Has h/o AF/AFL s/p multiple DCCVs last oon 07/29/2017.  Echo 3/23 EF 32% RV mildly reduced Moderate MR  Today he returns for HF follow up. Says things have been up and down for him. Got COVID for fist time last month. Was in AF for 2 weeks (mid December to Jan 8) on ICD interrogation. This resulted in loss of BiV pacing and increased fluid. Now back in NSR. BivPacing and fluid better. Feeling much better now. Able to do all ADLs without problem. Weight stable. No edema, orthopnea or PND. Lost weight on Ozempic but has been out x 1 month as Pharmacy doesn't have enough.     Cardiac Studies:   05/03/12: EF 25-30%.  Grade 1 diastolic dysfunction.  Mild MR.  Mod dilated LA.   10/2015: EF ~40%. RV mildly dilated.  8/17 EF 25-35% 07/27/2017 EF  25- 30% 3/21: EF ~30% RV ok. Personally reviewed  SH: Lives with son in [place]. Disabled. No ETOH or tobacco abuse  FH; Mother living: HT        Father deceased: was not part of his life so not sure health issues  Review of systems complete and found to be negative unless listed in HPI.   Past  Medical History:  Diagnosis Date   AICD (automatic cardioverter/defibrillator) present    Medtronic   Alcohol abuse    now quit   Anemia    iron defi   Arthritis    "fingers, knees, some days all my joints ache" (11/16/2015)   Asthma    Cholecystitis    Chronic systolic congestive heart failure (H[facility])    a. RHC (02/2011) RA 31/27, RV 71/27, PA67/45, PCWP 46, PA 49%, Fick CO: 3.4 b. ECHO (03/2014) EF 30-35%, grade II DD, mild MR, RV poorly visualized appears mildly decreased c. RHC (05/2014) RA 13, RV 54/4/11, PA 60/22 (37), PCWP 17, Fick CO/CI: 4.4 / 1.9, Thermo CO/CI: 3.8 /1.6, PVR 5.2 WU, PA 57% and 59%   Diverticulosis of colon    Gout    Hemorrhoids, internal    Hyperlipidemia    Hypertension    Morbid obesity (H[facility])    Nonischemic cardiomyopathy (H[facility])    a. LHC (02/2011) normal coronaries   OSA on CPAP    Paroxysmal atrial fibrillation (HCC)    Pneumonia 2000s X 1   Pulmonary hypertension (H[facility])    Renal insufficiency    Type II diabetes mellitus ([facility])    Ventricular tachycardia (H[facility])    s/p MDT ICD implant   Current Outpatient Medications  Medication Sig Dispense Refill   acetaminophen (TYLENOL)  500 MG tablet Take 500 mg by mouth every 6 (six) hours as needed for moderate pain or headache.      albuterol (VENTOLIN HFA) 108 (90 Base) MCG/ACT inhaler Inhale 2 puffs into the lungs every 6 (six) hours as needed for wheezing or shortness of breath.     allopurinol (ZYLOPRIM) 100 MG tablet Take 100 mg by mouth daily.     atorvastatin (LIPITOR) 80 MG tablet Take 80 mg by mouth at bedtime.     colchicine 0.6 MG tablet Take 0.6 mg by mouth daily as needed (for gout flares).      dapagliflozin propanediol (FARXIGA) 10 MG TABS tablet Take 10 mg by mouth daily.     digoxin (LANOXIN) 0.125 MG tablet Take 0.5 tablets by mouth every other day.     digoxin (LANOXIN) 0.25 MG tablet TAKE 1/2 TABLET BY MOUTH EVERY OTHER DAY 45 tablet 3   ENTRESTO 97-103 MG TAKE 1 TABLET BY MOUTH TWICE A DAY 180  tablet 3   Fluticasone-Salmeterol (ADVAIR) 250-50 MCG/DOSE AEPB Inhale 1 puff into the lungs every 12 (twelve) hours as needed (for flares/shortness of breath). Reported on 02/06/2016     hydrALAZINE (APRESOLINE) 25 MG tablet TAKE 1.5 TABLETS (37.5 MG TOTAL) BY MOUTH 3 (THREE) TIMES DAILY. 405 tablet 3   insulin detemir (LEVEMIR) 100 UNIT/ML injection Inject 40 Units into the skin at bedtime.      insulin lispro (HUMALOG) 100 UNIT/ML injection Inject 10 Units into the skin See admin instructions. 4 to 5 times daily with a meal, depending on how many meals are eaten that day.     isosorbide mononitrate (IMDUR) 30 MG 24 hr tablet Take 1 tablet (30 mg total) by mouth in the morning and at bedtime. 180 tablet 3   metolazone (ZAROXOLYN) 2.5 MG tablet Take 2.5 mg by mouth daily as needed (for edema).     montelukast (SINGULAIR) 10 MG tablet Take 10 mg by mouth as needed (allergies).     ondansetron (ZOFRAN-ODT) 8 MG disintegrating tablet Take 1 tablet (8 mg total) by mouth every 8 (eight) hours as needed for nausea or vomiting. 20 tablet 0   OZEMPIC, 1 MG/DOSE, 4 MG/3ML SOPN Inject 1 mg into the skin once a week.     sotalol (BETAPACE) 80 MG tablet Take 1 tablet (80 mg total) by mouth 2 (two) times daily. 180 tablet 2   spironolactone (ALDACTONE) 25 MG tablet Take 12.5 mg by mouth daily.      torsemide (DEMADEX) 20 MG tablet TAKE 2 TABLETS (40 MG TOTAL) BY MOUTH EVERY MORNING AND 1 TABLET (20 MG TOTAL) AT BEDTIME. 270 tablet 3   XARELTO 20 MG TABS tablet TAKE 1 TABLET BY MOUTH DAILY WITH SUPPER. 90 tablet 3   No current facility-administered medications for this encounter.   BP 100/70   Pulse 71   Wt (!) 141.5 kg (312 lb)   SpO2 96%   BMI 51.92 kg/m   Wt Readings from Last 3 Encounters:  10/27/22 (!) 141.5 kg (312 lb)  10/17/22 (!) 142 kg (313 lb)  09/01/22 134.7 kg (297 lb)   Physical Exam:  General:  Obese maleNo resp difficulty HEENT: normal Neck: supple. no JVD. Carotids 2+ bilat; no  bruits. No lymphadenopathy or thryomegaly appreciated. Cor: PMI nondisplaced. Regular rate & rhythm. No rubs, gallops or murmurs. Lungs: clear Abdomen: obesesoft, nontender, nondistended. No hepatosplenomegaly. No bruits or masses. Good bowel sounds. Extremities: no cyanosis, clubbing, rash, edema Neuro: alert & orientedx3, cranial  nerves grossly intact. moves all 4 extremities w/o difficulty. Affect pleasant   ECG: SR/V-paced 79 bpm, qtc 435 ms (Personally reviewed)  Device Interrogation: Was in AF for 2 weeks (mid December to Jan 8) on ICD interrogation. This resulted in loss of BiV pacing and increased fluid. Now back in NSR. Optivol ok. No VT. Activity level 1-2hr/day  ASSESSMENT & PLAN: 1) Chronic systolic HF: due to NICM s/p BiV Medtronic - LHC 5/12 No CAD - Echo 10/18 LVEF 25-30%.  - Echo 3/21 EF 30% - Echo (03/11/22) EF 31% RV mildly down  Moderate MR - s/p CRT-D  -  Stable NYHA II. Volume status stable on exam and Optivol.  - Continue torsemide 40 mg am/20 mg pm. OK to take metolazone PRN. - Continue Entresto 97/103 mg bid. - Continue spiro 12.5 mg daily. - Off bisoprolol in 2018. On sotalol 80 mg bid.  - Continue digoxin 0.0625 mg every other day.  - Continue hydralazine 37.5 mg tid + Imdur 30 mg bid. - Continue Farxiga 10 mg daily. No GU symptoms. - Check labs today  2) CKD stage III  - Followed previously by nephrology. - Baseline Creatinine 1.5-1.9 - Continue Farxiga - Labs today.  3) HTN  - Stable today. Actually on low end - Continue current medications.  4) OSA  - Continue Bipap.  - Compliant.   5) PAF/AFL - S/P multiple DCCVs. Most recent 07/29/17.  - Had episode of AFL with ICD shock in 2/18 at time of influenza - Pt refused ablation consideration.  - CHA2DS2VASC of at least 3. - Continue Xarelto 20 mg daily. Denies bleeding.  - Continue Sotalol per EP.  - Recent AF in December/[place] January in setting of COVID now back in NSR  6) Obesity   - Body  mass index is 51.92 kg/m.  - On Ozempic  7) Amiodarone thyroxicity   - Off Amiodarone. Completed Methimazole treatment per PCP.   - No change.  8) DM2 - Continue insulin + Farxiga. - Followed by PCP.   Glori Bickers, MD  10/27/2022 10:53 AM

## 2022-10-27 ENCOUNTER — Ambulatory Visit (HOSPITAL_COMMUNITY)
Admission: RE | Admit: 2022-10-27 | Discharge: 2022-10-27 | Disposition: A | Discharge status: Home / Self Care | Payer: 59 | Source: Ambulatory Visit | Attending: Internal Medicine | Admitting: Internal Medicine

## 2022-10-27 VITALS — BP 100/70 | HR 71 | Wt 312.0 lb

## 2022-10-27 DIAGNOSIS — I130 Hypertensive heart and chronic kidney disease with heart failure and stage 1 through stage 4 chronic kidney disease, or unspecified chronic kidney disease: Secondary | ICD-10-CM | POA: Diagnosis not present

## 2022-10-27 DIAGNOSIS — I4892 Unspecified atrial flutter: Secondary | ICD-10-CM | POA: Diagnosis not present

## 2022-10-27 DIAGNOSIS — E1122 Type 2 diabetes mellitus with diabetic chronic kidney disease: Secondary | ICD-10-CM | POA: Insufficient documentation

## 2022-10-27 DIAGNOSIS — Z79899 Other long term (current) drug therapy: Secondary | ICD-10-CM | POA: Diagnosis not present

## 2022-10-27 DIAGNOSIS — Z95810 Presence of automatic (implantable) cardiac defibrillator: Secondary | ICD-10-CM | POA: Insufficient documentation

## 2022-10-27 DIAGNOSIS — I428 Other cardiomyopathies: Secondary | ICD-10-CM | POA: Insufficient documentation

## 2022-10-27 DIAGNOSIS — E064 Drug-induced thyroiditis: Secondary | ICD-10-CM | POA: Diagnosis not present

## 2022-10-27 DIAGNOSIS — I480 Paroxysmal atrial fibrillation: Secondary | ICD-10-CM | POA: Diagnosis not present

## 2022-10-27 DIAGNOSIS — Z6841 Body Mass Index (BMI) 40.0 and over, adult: Secondary | ICD-10-CM | POA: Insufficient documentation

## 2022-10-27 DIAGNOSIS — Z7984 Long term (current) use of oral hypoglycemic drugs: Secondary | ICD-10-CM | POA: Insufficient documentation

## 2022-10-27 DIAGNOSIS — Z794 Long term (current) use of insulin: Secondary | ICD-10-CM | POA: Insufficient documentation

## 2022-10-27 DIAGNOSIS — N1832 Chronic kidney disease, stage 3b: Secondary | ICD-10-CM

## 2022-10-27 DIAGNOSIS — N183 Chronic kidney disease, stage 3 unspecified: Secondary | ICD-10-CM | POA: Insufficient documentation

## 2022-10-27 DIAGNOSIS — I5022 Chronic systolic (congestive) heart failure: Secondary | ICD-10-CM | POA: Diagnosis present

## 2022-10-27 DIAGNOSIS — G4733 Obstructive sleep apnea (adult) (pediatric): Secondary | ICD-10-CM | POA: Diagnosis not present

## 2022-10-27 DIAGNOSIS — I10 Essential (primary) hypertension: Secondary | ICD-10-CM | POA: Diagnosis not present

## 2022-10-27 DIAGNOSIS — Z7901 Long term (current) use of anticoagulants: Secondary | ICD-10-CM | POA: Insufficient documentation

## 2022-10-27 DIAGNOSIS — Z8616 Personal history of COVID-19: Secondary | ICD-10-CM | POA: Diagnosis not present

## 2022-10-27 LAB — BRAIN NATRIURETIC PEPTIDE: B Natriuretic Peptide: 35.3 pg/mL (ref 0.0–100.0)

## 2022-10-27 LAB — BASIC METABOLIC PANEL
Anion gap: 9 (ref 5–15)
BUN: 54 mg/dL — ABNORMAL HIGH (ref 8–23)
CO2: 25 mmol/L (ref 22–32)
Calcium: 9.1 mg/dL (ref 8.9–10.3)
Chloride: 100 mmol/L (ref 98–111)
Creatinine, Ser: 1.82 mg/dL — ABNORMAL HIGH (ref 0.61–1.24)
GFR, Estimated: 42 mL/min — ABNORMAL LOW (ref >60)
Glucose, Bld: 188 mg/dL — ABNORMAL HIGH (ref 70–99)
Potassium: 4.1 mmol/L (ref 3.5–5.1)
Sodium: 134 mmol/L — ABNORMAL LOW (ref 135–145)

## 2022-10-27 LAB — DIGOXIN LEVEL: Digoxin Level: 0.6 ng/mL — ABNORMAL LOW (ref 0.8–2.0)

## 2022-10-27 NOTE — Addendum Note (Signed)
Encounter addended by: Kerry Dory, CMA on: 10/27/2022 11:05 AM  Actions taken: Charge Capture section accepted

## 2022-10-27 NOTE — Patient Instructions (Signed)
It was great to see you today! No medication changes are needed at this time.   Labs today We will only contact you if something comes back abnormal or we need to make some changes. Otherwise no news is good news!   Your physician recommends that you schedule a follow-up appointment in: 6 months  in the [facility] Practition[facility] (PA/NP) Clinic    Do the following things EVERYDAY: Weigh yourself in the morning before breakfast. Write it down and keep it in a log. Take your medicines as prescribed Eat low salt foods--Limit salt (sodium) to 2000 mg per day.  Stay as active as you can everyday Limit all fluids for the day to less than 2 liters  At the [facility], you and your health needs are our priority. As part of our continuing mission to provide you with exceptional heart care, we have created designated Provider Care Teams. These Care Teams include your primary Cardiologist (physician) and Advanced Practice Providers (APPs- Physician Assistants and Nurse Practitioners) who all work together to provide you with the care you need, when you need it.   You may see any of the following providers on your designated Care Team at your next follow up: Dr Glori Bickers Dr Loralie Champagne Dr. Roxana Hires, NP Lyda Jester, [place] [facility] [place], [place] Forestine Na, NP Audry Riles, PharmD   Please be sure to bring in all your medications bottles to every appointment.

## 2022-10-27 NOTE — Addendum Note (Signed)
Encounter addended by: Kerry Dory, CMA on: 10/27/2022 11:03 AM  Actions taken: Order list changed, Diagnosis association updated, Clinical Note Signed

## 2022-11-05 ENCOUNTER — Ambulatory Visit: Payer: 59

## 2022-11-05 DIAGNOSIS — I5022 Chronic systolic (congestive) heart failure: Secondary | ICD-10-CM

## 2022-11-05 LAB — CUP PACEART REMOTE DEVICE CHECK
Battery Remaining Longevity: 14 mo
Battery Voltage: 2.89 V
Brady Statistic AP VP Percent: 0.08 %
Brady Statistic AP VS Percent: 0.01 %
Brady Statistic AS VP Percent: 98.19 %
Brady Statistic AS VS Percent: 1.72 %
Brady Statistic RA Percent Paced: 0.09 %
Brady Statistic RV Percent Paced: 1.17 %
Date Time Interrogation Session: 20240131043822
HighPow Impedance: 112 Ohm
Implantable Lead Connection Status: 753985
Implantable Lead Connection Status: 753985
Implantable Lead Connection Status: 753985
Implantable Lead Implant Date: 20090901
Implantable Lead Implant Date: 20170210
Implantable Lead Implant Date: 20170210
Implantable Lead Location: 753858
Implantable Lead Location: 753859
Implantable Lead Location: 753860
Implantable Lead Model: 4598
Implantable Lead Model: 5076
Implantable Lead Model: 6935
Implantable Pulse Generator Implant Date: 20170210
Lead Channel Impedance Value: 1121 Ohm
Lead Channel Impedance Value: 1121 Ohm
Lead Channel Impedance Value: 1121 Ohm
Lead Channel Impedance Value: 361 Ohm
Lead Channel Impedance Value: 399 Ohm
Lead Channel Impedance Value: 418 Ohm
Lead Channel Impedance Value: 513 Ohm
Lead Channel Impedance Value: 513 Ohm
Lead Channel Impedance Value: 513 Ohm
Lead Channel Impedance Value: 608 Ohm
Lead Channel Impedance Value: 722 Ohm
Lead Channel Impedance Value: 836 Ohm
Lead Channel Impedance Value: 893 Ohm
Lead Channel Pacing Threshold Amplitude: 0.625 V
Lead Channel Pacing Threshold Amplitude: 0.875 V
Lead Channel Pacing Threshold Amplitude: 1.75 V
Lead Channel Pacing Threshold Pulse Width: 0.4 ms
Lead Channel Pacing Threshold Pulse Width: 0.4 ms
Lead Channel Pacing Threshold Pulse Width: 0.4 ms
Lead Channel Sensing Intrinsic Amplitude: 12.875 mV
Lead Channel Sensing Intrinsic Amplitude: 12.875 mV
Lead Channel Sensing Intrinsic Amplitude: 2.625 mV
Lead Channel Sensing Intrinsic Amplitude: 2.625 mV
Lead Channel Setting Pacing Amplitude: 1.5 V
Lead Channel Setting Pacing Amplitude: 2 V
Lead Channel Setting Pacing Amplitude: 2.75 V
Lead Channel Setting Pacing Pulse Width: 0.4 ms
Lead Channel Setting Pacing Pulse Width: 0.4 ms
Lead Channel Setting Sensing Sensitivity: 0.3 mV
Zone Setting Status: 755011
Zone Setting Status: 755011

## 2022-11-17 ENCOUNTER — Ambulatory Visit: Payer: 59 | Attending: Cardiology

## 2022-11-17 ENCOUNTER — Encounter: Payer: 59 | Admitting: Cardiology

## 2022-11-17 DIAGNOSIS — I5022 Chronic systolic (congestive) heart failure: Secondary | ICD-10-CM | POA: Diagnosis not present

## 2022-11-17 DIAGNOSIS — Z95810 Presence of automatic (implantable) cardiac defibrillator: Secondary | ICD-10-CM

## 2022-11-18 NOTE — Progress Notes (Signed)
EPIC Encounter for ICM Monitoring  Patient Name: Gary Hutchinson is a 62 y.o. male Date: 11/18/2022 Primary Care Physican: Shon Baton, MD Primary Cardiologist: [place] Electrophysiologist: Curt Bears Bi-V Pacing: 98.3%    03/27/2022 Weight: 310 lbs 05/08/2022 Weight:  308 lbs 9/6/203 Weight: 308 lbs 08/26/2022 Weight: 311 lbs 09/18/2023 Weight: 312 lbs   Since 05-Nov-2022 Time in AT/AF  0.0 hr/day (0.0%)                     Spoke with patient and heart failure questions reviewed.  Transmission results reviewed.  Pt asymptomatic for fluid accumulation.  Reports feeling well at this time and voices no complaints.      Optivol thoracic impedance suggesting possible fluid accumulation starting 2/6 and trending back toward baseline.     Prescribed dosage:  Torsemide 20 mg Take 2 tablets (40 mg total) by mouth every morning and 1 tablet (20 total) at bedtime. Metolazone 2.5 mg 1 tablet as needed.   Spironolactone 12.5 mg take 1 tablet daily   Labs:   09/01/2022 Creatinine 1.71, BUN 46, Potassium 3.8, Sodium 139, GFR 45 05/06/2022 Creatinine 1.63, BUN 40, Potassium 4.3, Sodium 138, GFr 48 07/11/2022 Creatinine 1.73, BUN 48, Potassium 3.7, Sodium 136, GFR 44 A complete set of results can be found in Results Review.   Recommendations:   Recommendation to limit salt intake.  He will take Metolazone if he develops symptoms.     Follow-up plan: ICM clinic phone appointment on 11/25/2022 to recheck fluid levels.   91 day device clinic remote transmission 01/19/2023.     EP/Cardiology Office Visits:  04/27/2023 with HF clinic.  01/19/2023 with [name], PA .     Copy of ICM check sent to Dr. Curt Bears.  3 month ICM trend: 11/18/2022.    12-14 Month ICM trend:     [name], RN 11/18/2022 10:24 AM

## 2022-11-25 ENCOUNTER — Ambulatory Visit: Payer: 59 | Attending: Cardiology

## 2022-11-25 DIAGNOSIS — I5022 Chronic systolic (congestive) heart failure: Secondary | ICD-10-CM

## 2022-11-25 DIAGNOSIS — Z95810 Presence of automatic (implantable) cardiac defibrillator: Secondary | ICD-10-CM

## 2022-11-26 NOTE — Progress Notes (Signed)
EPIC Encounter for ICM Monitoring  Patient Name: Gary Hutchinson is a 62 y.o. male Date: 11/26/2022 Primary Care Physican: Shon Baton, MD Primary Cardiologist: [place] Electrophysiologist: Curt Bears Bi-V Pacing: 98.1%    03/27/2022 Weight: 310 lbs 05/08/2022 Weight:  308 lbs 9/6/203 Weight: 308 lbs 08/26/2022 Weight: 311 lbs 09/18/2023 Weight: 312 lbs 11/26/2022 Weight: 309 lbs   Since 17-Nov-2022 Time in AT/AF  0.0 hr/day (0.0%)                     Spoke with patient and heart failure questions reviewed.  Transmission results reviewed.  Pt asymptomatic for fluid accumulation.  Reports feeling well at this time and voices no complaints.        Optivol thoracic impedance suggesting fluid levels returned to normal.     Prescribed dosage:  Torsemide 20 mg Take 2 tablets (40 mg total) by mouth every morning and 1 tablet (20 total) at bedtime. Metolazone 2.5 mg 1 tablet as needed.   Spironolactone 12.5 mg take 1 tablet daily   Labs:   09/01/2022 Creatinine 1.71, BUN 46, Potassium 3.8, Sodium 139, GFR 45 05/06/2022 Creatinine 1.63, BUN 40, Potassium 4.3, Sodium 138, GFr 48 07/11/2022 Creatinine 1.73, BUN 48, Potassium 3.7, Sodium 136, GFR 44 A complete set of results can be found in Results Review.   Recommendations:   No changes and encouraged to call if experiencing any fluid symptoms.    Follow-up plan: ICM clinic phone appointment on 12/22/2022.   91 day device clinic remote transmission 02/04/2023.     EP/Cardiology Office Visits:  04/27/2023 with HF clinic.  01/19/2023 with [name], PA.     Copy of ICM check sent to Dr. Curt Bears.  3 month ICM trend: 11/25/2022.    12-14 Month ICM trend:     [name], RN 11/26/2022 8:39 AM

## 2022-12-22 ENCOUNTER — Ambulatory Visit: Payer: 59 | Attending: Cardiology

## 2022-12-22 DIAGNOSIS — Z95810 Presence of automatic (implantable) cardiac defibrillator: Secondary | ICD-10-CM

## 2022-12-22 DIAGNOSIS — I5022 Chronic systolic (congestive) heart failure: Secondary | ICD-10-CM

## 2022-12-23 NOTE — Progress Notes (Signed)
EPIC Encounter for ICM Monitoring  Patient Name: Gary Hutchinson is a 62 y.o. male Date: 12/23/2022 Primary Care Physican: Shon Baton, MD Primary Cardiologist: [place] Electrophysiologist: Curt Bears Bi-V Pacing: 98.1%    09/18/2023 Weight: 312 lbs 11/26/2022 Weight: 309 lbs 12/22/2022 Weight: 309 lbs   Since 25-Nov-2022 Time in AT/AF  <0.1 hr/day (<0.1%)                     Spoke with patient and heart failure questions reviewed.  Transmission results reviewed.  Pt asymptomatic for fluid accumulation.   He reports nausea due to Ozempic side effects and missed some Torsemide dosages which may contribute to decreased impedance.    Optivol thoracic impedance suggesting possible fluid accumulation starting 3/11 and trending back toward baseline.     Prescribed dosage:  Torsemide 20 mg Take 2 tablets (40 mg total) by mouth every morning and 1 tablet (20 total) at bedtime. Metolazone 2.5 mg 1 tablet as needed.   Spironolactone 12.5 mg take 1 tablet daily   Labs:   09/01/2022 Creatinine 1.71, BUN 46, Potassium 3.8, Sodium 139, GFR 45 05/06/2022 Creatinine 1.63, BUN 40, Potassium 4.3, Sodium 138, GFr 48 07/11/2022 Creatinine 1.73, BUN 48, Potassium 3.7, Sodium 136, GFR 44 A complete set of results can be found in Results Review.   Recommendations:   Pt resume prescribed Torsemide after nausea has subsided.  He will take Metolazone if he develops any fluid symptoms.    Follow-up plan: ICM clinic phone appointment on 12/29/2022 to recheck fluid levels.   91 day device clinic remote transmission 02/04/2023.     EP/Cardiology Office Visits:  04/27/2023 with HF clinic.  01/19/2023 with [name], PA.     Copy of ICM check sent to Dr. Curt Bears.  3 month ICM trend: 12/22/2022.    12-14 Month ICM trend:     [name], RN 12/23/2022 12:05 PM

## 2022-12-29 ENCOUNTER — Ambulatory Visit: Payer: 59 | Attending: Cardiology

## 2022-12-29 DIAGNOSIS — I5022 Chronic systolic (congestive) heart failure: Secondary | ICD-10-CM

## 2022-12-29 DIAGNOSIS — Z95810 Presence of automatic (implantable) cardiac defibrillator: Secondary | ICD-10-CM

## 2022-12-29 NOTE — Progress Notes (Signed)
EPIC Encounter for ICM Monitoring  Patient Name: Gary Hutchinson is a 62 y.o. male Date: 12/29/2022 Primary Care Physican: Shon Baton, MD Primary Cardiologist: [place] Electrophysiologist: Curt Bears Bi-V Pacing: 98.4%    09/18/2023 Weight: 312 lbs 11/26/2022 Weight: 309 lbs 12/22/2022 Weight: 309 lbs   Since 22-Dec-2022 Time in AT/AF  <0.1 hr/day (<0.1%)                     Spoke with patient and heart failure questions reviewed.  Transmission results reviewed.  Pt asymptomatic for fluid accumulation.  Pt reports he ate in restaurants over the weekend which may contribute to the return of possible fluid accumulation.    Optivol thoracic impedance suggesting fluid levels returned to normal but possible fluid accumulation returned on 3/23.     Prescribed dosage:  Torsemide 20 mg Take 2 tablets (40 mg total) by mouth every morning and 1 tablet (20 total) at bedtime.  He reports Dr Haroldine Laws has approved that he can take extra Torsemide or Metolazone when needed.  Metolazone 2.5 mg 1 tablet as needed.   Spironolactone 12.5 mg take 1 tablet daily   Labs:   10/27/2022 Creatinine 1.82, BUN 54, Potassium 4.1, Sodium 134, GFR 42 10/17/2022 Creatinine 1.90, BUN 47, Potassium 4.3, Sodium 141 09/01/2022 Creatinine 1.71, BUN 46, Potassium 3.8, Sodium 139, GFR 45 05/06/2022 Creatinine 1.63, BUN 40, Potassium 4.3, Sodium 138, GFr 48 07/11/2022 Creatinine 1.73, BUN 48, Potassium 3.7, Sodium 136, GFR 44 A complete set of results can be found in Results Review.   Recommendations:   He will self adjust torsemide or take PRN Metolazone for fluid.     Follow-up plan: ICM clinic phone appointment on 01/05/2023 (manual) to recheck fluid levels.   91 day device clinic remote transmission 02/04/2023.     EP/Cardiology Office Visits:  04/27/2023 with HF clinic.  01/19/2023 with [name], PA.     Copy of ICM check sent to Dr. Curt Bears and Dr Haroldine Laws as Juluis Rainier.  3 month ICM trend: 12/29/2022.    12-14  Month ICM trend:     [name], RN 12/29/2022 1:28 PM

## 2022-12-30 ENCOUNTER — Other Ambulatory Visit (HOSPITAL_COMMUNITY): Payer: Self-pay | Admitting: Internal Medicine

## 2023-01-12 NOTE — Progress Notes (Signed)
No ICM remote transmission received for 01/05/2023 and next ICM transmission scheduled for 01/26/2023.

## 2023-01-16 NOTE — Progress Notes (Unsigned)
  Electrophysiology Office Note:   Date:  01/19/2023  ID:  Gary Hutchinson, DOB 01-01-1961, MRN [id number]  Primary Cardiologist: None Electrophysiologist: Will Jorja Loa, MD   History of Present Illness:   Gary Hutchinson is a 62 y.o. male with h/o HFrEF, LBBB s/p CRT-D, VT, parox AF, and HTN seen today for routine electrophysiology followup.   Seen 10/2022 with decreased BiV pacing and increased AT/AF. Pt noted to have had URI and missed doses of medications.   Since last being seen in our clinic the patient reports doing very well. .  he denies chest pain, palpitations, dyspnea, PND, orthopnea, nausea, vomiting, dizziness, syncope, edema, weight gain, or early satiety. He has had no further sustained AT/AF and BiV pacing is > 98% today.   Review of systems complete and found to be negative unless listed in HPI.   Device Information: MDT CRT-D, impl 2017; dx CHF + inappropriate shock for rapid atypical Aflutter in sitting of influenza + inappropriate shock for 1:1 aflutter 09/2017   AAD History: Amiodarone stopped d/t thyrotoxicity Sotalol initiated 09/2017  Studies Reviewed:    ICD Interrogation-  reviewed in detail today,  See PACEART report.  EKG is ordered today. Personal review shows NSR at 77 bpm (VP) QTc ~470 manually   Risk Assessment/Calculations:             Physical Exam:   VS:  BP 102/68   Pulse 77   Ht 5\' 5"  (1.651 m)   Wt (!) 308 lb (139.7 kg)   SpO2 97%   BMI 51.25 kg/m    Wt Readings from Last 3 Encounters:  01/19/23 (!) 308 lb (139.7 kg)  10/27/22 (!) 312 lb (141.5 kg)  10/17/22 (!) 313 lb (142 kg)     GEN: Well nourished, well developed in no acute distress NECK: No JVD; No carotid bruits CARDIAC: Regular rate and rhythm, no murmurs, rubs, gallops RESPIRATORY:  Clear to auscultation without rales, wheezing or rhonchi  ABDOMEN: Soft, non-tender, non-distended EXTREMITIES:  No edema; No deformity   ASSESSMENT AND PLAN:    Chronic systolic  dysfunction s/p Medtronic CRT-D  euvolemic today Stable on an appropriate medical regimen Normal ICD function See Pace Art report No changes today  Parox AF EKG today shows NSR at 77 bpm with stable intervals.   QTc close to ~470 when measured manually Continue Xarelto for CHA2DS2VASc  of at least 3.  Burden <0.1% -> BiV pacing > 98%  H/o VT Continue sotalol  Labs today.  HTN Stable on current regimen      Disposition:   Follow up with Dr. Elberta Fortis in 6 months   Signed, Graciella Freer, [name]

## 2023-01-19 ENCOUNTER — Ambulatory Visit: Payer: 59 | Attending: Student | Admitting: Student

## 2023-01-19 ENCOUNTER — Encounter: Payer: Self-pay | Admitting: Student

## 2023-01-19 VITALS — BP 102/68 | HR 77 | Ht 65.0 in | Wt 308.0 lb

## 2023-01-19 DIAGNOSIS — I428 Other cardiomyopathies: Secondary | ICD-10-CM

## 2023-01-19 DIAGNOSIS — I480 Paroxysmal atrial fibrillation: Secondary | ICD-10-CM

## 2023-01-19 DIAGNOSIS — I5022 Chronic systolic (congestive) heart failure: Secondary | ICD-10-CM | POA: Diagnosis not present

## 2023-01-19 DIAGNOSIS — I10 Essential (primary) hypertension: Secondary | ICD-10-CM | POA: Diagnosis not present

## 2023-01-19 DIAGNOSIS — I472 Ventricular tachycardia, unspecified: Secondary | ICD-10-CM

## 2023-01-19 LAB — CUP PACEART INCLINIC DEVICE CHECK
Battery Remaining Longevity: 13 mo
Battery Voltage: 2.88 V
Brady Statistic AP VP Percent: 0.14 %
Brady Statistic AP VS Percent: 0.01 %
Brady Statistic AS VP Percent: 98.32 %
Brady Statistic AS VS Percent: 1.52 %
Brady Statistic RA Percent Paced: 0.16 %
Brady Statistic RV Percent Paced: 4.92 %
Date Time Interrogation Session: 20240415103559
HighPow Impedance: 85 Ohm
Implantable Lead Connection Status: 753985
Implantable Lead Connection Status: 753985
Implantable Lead Connection Status: 753985
Implantable Lead Implant Date: 20090901
Implantable Lead Implant Date: 20170210
Implantable Lead Implant Date: 20170210
Implantable Lead Location: 753858
Implantable Lead Location: 753859
Implantable Lead Location: 753860
Implantable Lead Model: 4598
Implantable Lead Model: 5076
Implantable Lead Model: 6935
Implantable Pulse Generator Implant Date: 20170210
Lead Channel Impedance Value: 1178 Ohm
Lead Channel Impedance Value: 1197 Ohm
Lead Channel Impedance Value: 1254 Ohm
Lead Channel Impedance Value: 361 Ohm
Lead Channel Impedance Value: 399 Ohm
Lead Channel Impedance Value: 418 Ohm
Lead Channel Impedance Value: 475 Ohm
Lead Channel Impedance Value: 513 Ohm
Lead Channel Impedance Value: 551 Ohm
Lead Channel Impedance Value: 608 Ohm
Lead Channel Impedance Value: 779 Ohm
Lead Channel Impedance Value: 836 Ohm
Lead Channel Impedance Value: 893 Ohm
Lead Channel Pacing Threshold Amplitude: 0.5 V
Lead Channel Pacing Threshold Amplitude: 0.75 V
Lead Channel Pacing Threshold Amplitude: 2 V
Lead Channel Pacing Threshold Pulse Width: 0.4 ms
Lead Channel Pacing Threshold Pulse Width: 0.4 ms
Lead Channel Pacing Threshold Pulse Width: 0.4 ms
Lead Channel Sensing Intrinsic Amplitude: 13 mV
Lead Channel Sensing Intrinsic Amplitude: 13.25 mV
Lead Channel Sensing Intrinsic Amplitude: 2.5 mV
Lead Channel Sensing Intrinsic Amplitude: 3.125 mV
Lead Channel Setting Pacing Amplitude: 1.5 V
Lead Channel Setting Pacing Amplitude: 2 V
Lead Channel Setting Pacing Amplitude: 3 V
Lead Channel Setting Pacing Pulse Width: 0.4 ms
Lead Channel Setting Pacing Pulse Width: 0.4 ms
Lead Channel Setting Sensing Sensitivity: 0.3 mV
Zone Setting Status: 755011
Zone Setting Status: 755011

## 2023-01-19 NOTE — Patient Instructions (Signed)
Medication Instructions:  Your physician recommends that you continue on your current medications as directed. Please refer to the Current Medication list given to you today.  *If you need a refill on your cardiac medications before your next appointment, please call your pharmacy*  Lab Work: BMET, MAG--TODAY If you have labs (blood work) drawn today and your tests are completely normal, you will receive your results only by: MyChart Message (if you have MyChart) OR A paper copy in the mail If you have any lab test that is abnormal or we need to change your treatment, we will call you to review the results.  Follow-Up: At [facility], you and your health needs are our priority.  As part of our continuing mission to provide you with exceptional heart care, we have created designated Provider Care Teams.  These Care Teams include your primary Cardiologist (physician) and Advanced Practice Providers (APPs -  Physician Assistants and Nurse Practitioners) who all work together to provide you with the care you need, when you need it.  Your next appointment:   6 month(s)  Provider:   Loman Brooklyn, MD

## 2023-01-20 ENCOUNTER — Telehealth: Payer: Self-pay | Admitting: Student

## 2023-01-20 LAB — BASIC METABOLIC PANEL
BUN/Creatinine Ratio: 28 — ABNORMAL HIGH (ref 10–24)
BUN: 53 mg/dL — ABNORMAL HIGH (ref 8–27)
CO2: 24 mmol/L (ref 20–29)
Calcium: 9.6 mg/dL (ref 8.6–10.2)
Chloride: 99 mmol/L (ref 96–106)
Creatinine, Ser: 1.88 mg/dL — ABNORMAL HIGH (ref 0.76–1.27)
Glucose: 147 mg/dL — ABNORMAL HIGH (ref 70–99)
Potassium: 4.1 mmol/L (ref 3.5–5.2)
Sodium: 141 mmol/L (ref 134–144)
eGFR: 40 mL/min/{1.73_m2} — ABNORMAL LOW (ref >59)

## 2023-01-20 LAB — MAGNESIUM: Magnesium: 3.2 mg/dL — ABNORMAL HIGH (ref 1.6–2.3)

## 2023-01-20 NOTE — Telephone Encounter (Signed)
Pt has some additional questions/concerns he would like to talk about again regarding his lab results

## 2023-01-20 NOTE — Telephone Encounter (Signed)
Patient calling back as he spoke with staff earlier about elevated magnesium on labs. He states he "was not quite awake yet" and wanted to review possible causes of elevated magnesium. Discussed how magnesium can be hidden in multivitamin supplements, dietary supplements, flavored waters or sparkling drinks. Reviewed how to read nutrition label on foods and supplements. Patient stated he would look through his cabinets to see if he could isolate any particular offender and let us know if he comes across anything that seems suspicious. Forwarded patient responses to M. Tillery.

## 2023-01-26 ENCOUNTER — Ambulatory Visit: Payer: 59 | Attending: Cardiology

## 2023-01-26 DIAGNOSIS — Z95810 Presence of automatic (implantable) cardiac defibrillator: Secondary | ICD-10-CM

## 2023-01-26 DIAGNOSIS — I5022 Chronic systolic (congestive) heart failure: Secondary | ICD-10-CM | POA: Diagnosis not present

## 2023-01-27 NOTE — Progress Notes (Signed)
EPIC Encounter for ICM Monitoring  Patient Name: Gary Hutchinson is a 62 y.o. male Date: 01/27/2023 Primary Care Physican: Creola Corn, MD Primary Cardiologist: [name] Electrophysiologist: Elberta Fortis Bi-V Pacing: 98.2%    09/18/2023 Weight: 312 lbs 11/26/2022 Weight: 309 lbs 12/22/2022 Weight: 309 lbs 01/27/2023 Weight: 310 lbs   Since 19-Jan-2023 Time in AT/AF  0.0 hr/day (0.0%)                     Spoke with patient and heart failure questions reviewed.  Transmission results reviewed.  Pt asymptomatic for fluid accumulation.   He has been drinking more than the recommended 64 oz fluid daily and not restricting salt intake.    Optivol thoracic impedance suggesting possible fluid accumulation starting 4/1.     Prescribed dosage:  Torsemide 20 mg Take 2 tablets (40 mg total) by mouth every morning and 1 tablet (20 total) at bedtime.  He reports Dr Gala Romney has approved that he can take extra Torsemide or Metolazone when needed.  Metolazone 2.5 mg 1 tablet as needed.   Spironolactone 12.5 mg take 1 tablet daily   Labs:   10/27/2022 Creatinine 1.82, BUN 54, Potassium 4.1, Sodium 134, GFR 42 10/17/2022 Creatinine 1.90, BUN 47, Potassium 4.3, Sodium 141 09/01/2022 Creatinine 1.71, BUN 46, Potassium 3.8, Sodium 139, GFR 45 05/06/2022 Creatinine 1.63, BUN 40, Potassium 4.3, Sodium 138, GFr 48 07/11/2022 Creatinine 1.73, BUN 48, Potassium 3.7, Sodium 136, GFR 44 A complete set of results can be found in Results Review.   Recommendations:   He will take PRN Metolazone for fluid accumulation.  Recommendation to limit salt intake to 2000 mg daily and fluid intake to 64 oz daily.  Encouraged to call if experiencing any fluid symptoms.    Follow-up plan: ICM clinic phone appointment on 02/02/2023 to recheck fluid levels.   91 day device clinic remote transmission 02/04/2023.     EP/Cardiology Office Visits:  04/27/2023 with HF clinic.  Recall 07/18/2023 with Dr Elberta Fortis.     Copy of ICM check sent  to Dr. Elberta Fortis and Dr Gala Romney as Lorain Childes.  3 month ICM trend: 01/26/2023.    12-14 Month ICM trend:     Karie Soda, RN 01/27/2023 2:52 PM

## 2023-01-28 NOTE — Telephone Encounter (Signed)
Called to follow up with patient as to whether he was having any stomach upset. No answer, left detailed message per DPR asking him to call office if he was having any stomach upset, otherwise to keep an eye on dietary intake of magnesium.

## 2023-01-28 NOTE — Telephone Encounter (Signed)
Patient calling back, endorses recent diet change removing excessive amounts of a particular smoothie he drinks. He denies any stomach upset and verbalizes understanding to call in if he experiences stomach upset, weakness.

## 2023-02-02 ENCOUNTER — Ambulatory Visit: Payer: 59 | Attending: Cardiology

## 2023-02-02 DIAGNOSIS — Z95810 Presence of automatic (implantable) cardiac defibrillator: Secondary | ICD-10-CM

## 2023-02-02 DIAGNOSIS — I5022 Chronic systolic (congestive) heart failure: Secondary | ICD-10-CM

## 2023-02-03 NOTE — Progress Notes (Signed)
EPIC Encounter for ICM Monitoring  Patient Name: Gary Hutchinson is a 62 y.o. male Date: 02/03/2023 Primary Care Physican: Creola Corn, MD Primary Cardiologist: [name] Electrophysiologist: Elberta Fortis Bi-V Pacing: 98.2%    09/18/2023 Weight: 312 lbs 11/26/2022 Weight: 309 lbs 12/22/2022 Weight: 309 lbs 01/27/2023 Weight: 310 lbs 02/03/2023 Weight: 307-308 lbs   Clinical Status (26-Jan-2023 to 02-Feb-2023) Time in AT/AF  0.0 hr/day (0.0%)                     Spoke with patient and heart failure questions reviewed.  Transmission results reviewed.  Pt asymptomatic for fluid accumulation.   He has been drinking more than the recommended 64 oz fluid daily and eating a lot watermelon.  Pt provided 2nd phone number [phone] in case mobile number is disconnected.   Optivol thoracic impedance suggesting fluid levels returned to normal 4/28 after taking extra Torsemide.      Prescribed dosage:  Torsemide 20 mg Take 2 tablets (40 mg total) by mouth every morning and 1 tablet (20 total) at bedtime.  He reports Dr Gala Romney has approved that he can take extra Torsemide or Metolazone when needed.  Metolazone 2.5 mg 1 tablet as needed.   Spironolactone 12.5 mg take 1 tablet daily   Labs:   10/27/2022 Creatinine 1.82, BUN 54, Potassium 4.1, Sodium 134, GFR 42 10/17/2022 Creatinine 1.90, BUN 47, Potassium 4.3, Sodium 141 09/01/2022 Creatinine 1.71, BUN 46, Potassium 3.8, Sodium 139, GFR 45 05/06/2022 Creatinine 1.63, BUN 40, Potassium 4.3, Sodium 138, GFr 48 07/11/2022 Creatinine 1.73, BUN 48, Potassium 3.7, Sodium 136, GFR 44 A complete set of results can be found in Results Review.   Recommendations:    Recommendation to limit salt intake to 2000 mg daily and fluid intake to 64 oz daily.  Encouraged to call if experiencing any fluid symptoms.    Follow-up plan: ICM clinic phone appointment on 03/03/2023.   91 day device clinic remote transmission 02/04/2023.     EP/Cardiology Office Visits:   04/27/2023 with HF clinic.  Recall 07/18/2023 with Dr Elberta Fortis.     Copy of ICM check sent to Dr. Elberta Fortis   3 month ICM trend: 02/02/2023.    12-14 Month ICM trend:     Karie Soda, RN 02/03/2023 2:36 PM

## 2023-02-04 ENCOUNTER — Ambulatory Visit (INDEPENDENT_AMBULATORY_CARE_PROVIDER_SITE_OTHER): Payer: 59

## 2023-02-04 DIAGNOSIS — I428 Other cardiomyopathies: Secondary | ICD-10-CM

## 2023-02-04 LAB — CUP PACEART REMOTE DEVICE CHECK
Battery Remaining Longevity: 13 mo
Battery Voltage: 2.88 V
Brady Statistic AP VP Percent: 0.26 %
Brady Statistic AP VS Percent: 0.01 %
Brady Statistic AS VP Percent: 98.2 %
Brady Statistic AS VS Percent: 1.53 %
Brady Statistic RA Percent Paced: 0.27 %
Brady Statistic RV Percent Paced: 7.92 %
Date Time Interrogation Session: 20240501022604
HighPow Impedance: 102 Ohm
Implantable Lead Connection Status: 753985
Implantable Lead Connection Status: 753985
Implantable Lead Connection Status: 753985
Implantable Lead Implant Date: 20090901
Implantable Lead Implant Date: 20170210
Implantable Lead Implant Date: 20170210
Implantable Lead Location: 753858
Implantable Lead Location: 753859
Implantable Lead Location: 753860
Implantable Lead Model: 4598
Implantable Lead Model: 5076
Implantable Lead Model: 6935
Implantable Pulse Generator Implant Date: 20170210
Lead Channel Impedance Value: 1140 Ohm
Lead Channel Impedance Value: 1178 Ohm
Lead Channel Impedance Value: 1178 Ohm
Lead Channel Impedance Value: 342 Ohm
Lead Channel Impedance Value: 361 Ohm
Lead Channel Impedance Value: 456 Ohm
Lead Channel Impedance Value: 475 Ohm
Lead Channel Impedance Value: 513 Ohm
Lead Channel Impedance Value: 532 Ohm
Lead Channel Impedance Value: 589 Ohm
Lead Channel Impedance Value: 760 Ohm
Lead Channel Impedance Value: 836 Ohm
Lead Channel Impedance Value: 931 Ohm
Lead Channel Pacing Threshold Amplitude: 0.625 V
Lead Channel Pacing Threshold Amplitude: 0.75 V
Lead Channel Pacing Threshold Amplitude: 1.875 V
Lead Channel Pacing Threshold Pulse Width: 0.4 ms
Lead Channel Pacing Threshold Pulse Width: 0.4 ms
Lead Channel Pacing Threshold Pulse Width: 0.4 ms
Lead Channel Sensing Intrinsic Amplitude: 13.75 mV
Lead Channel Sensing Intrinsic Amplitude: 13.75 mV
Lead Channel Sensing Intrinsic Amplitude: 2.75 mV
Lead Channel Sensing Intrinsic Amplitude: 2.75 mV
Lead Channel Setting Pacing Amplitude: 1.5 V
Lead Channel Setting Pacing Amplitude: 2 V
Lead Channel Setting Pacing Amplitude: 3 V
Lead Channel Setting Pacing Pulse Width: 0.4 ms
Lead Channel Setting Pacing Pulse Width: 0.4 ms
Lead Channel Setting Sensing Sensitivity: 0.3 mV
Zone Setting Status: 755011
Zone Setting Status: 755011

## 2023-02-15 ENCOUNTER — Other Ambulatory Visit (HOSPITAL_COMMUNITY): Payer: Self-pay | Admitting: Internal Medicine

## 2023-02-25 NOTE — Progress Notes (Signed)
Remote ICD transmission.   

## 2023-03-03 ENCOUNTER — Ambulatory Visit: Payer: 59 | Attending: Cardiology

## 2023-03-03 DIAGNOSIS — I5022 Chronic systolic (congestive) heart failure: Secondary | ICD-10-CM

## 2023-03-03 DIAGNOSIS — Z95810 Presence of automatic (implantable) cardiac defibrillator: Secondary | ICD-10-CM

## 2023-03-04 NOTE — Progress Notes (Signed)
EPIC Encounter for ICM Monitoring  Patient Name: Gary Hutchinson is a 62 y.o. male Date: 03/04/2023 Primary Care Physican: Creola Corn, MD Primary Cardiologist: [name] Electrophysiologist: Elberta Fortis Bi-V Pacing: 97.6%    09/18/2023 Weight: 312 lbs 11/26/2022 Weight: 309 lbs 12/22/2022 Weight: 309 lbs 01/27/2023 Weight: 310 lbs 02/03/2023 Weight: 307-308 lbs 03/04/2023 Weight: Not weighed   Clinical Status Since 04-Feb-2023 Time in AT/AF  0.0 hr/day (0.0%)                     Spoke with patient and heart failure questions reviewed.  Transmission results reviewed.  Pt asymptomatic for fluid accumulation.  Reports feeling well at this time and voices no complaints.      Optivol thoracic impedance suggesting possible fluid accumulation starting 5/16 and also 5/6-5/11.      Prescribed dosage:  Torsemide 20 mg Take 2 tablets (40 mg total) by mouth every morning and 1 tablet (20 total) at bedtime.  He reports Dr Gala Romney has approved that he can take extra Torsemide or Metolazone when needed.  Metolazone 2.5 mg 1 tablet as needed.   Spironolactone 12.5 mg take 1 tablet daily   Labs:   10/27/2022 Creatinine 1.82, BUN 54, Potassium 4.1, Sodium 134, GFR 42 10/17/2022 Creatinine 1.90, BUN 47, Potassium 4.3, Sodium 141 09/01/2022 Creatinine 1.71, BUN 46, Potassium 3.8, Sodium 139, GFR 45 05/06/2022 Creatinine 1.63, BUN 40, Potassium 4.3, Sodium 138, GFr 48 07/11/2022 Creatinine 1.73, BUN 48, Potassium 3.7, Sodium 136, GFR 44 A complete set of results can be found in Results Review.   Recommendations:  Pt will take PRN Metolazone for fluid accumulation.     Follow-up plan: ICM clinic phone appointment on 03/09/2023 (manual) to recheck fluid levels.   91 day device clinic remote transmission 05/06/2023.     EP/Cardiology Office Visits:  04/27/2023 with HF clinic.  Recall 07/18/2023 with Dr Elberta Fortis.     Copy of ICM check sent to Dr. Elberta Fortis and Dr Gala Romney as Lorain Childes.  3 month ICM trend:  03/03/2023.    12-14 Month ICM trend:     Karie Soda, RN 03/04/2023 1:13 PM

## 2023-03-09 ENCOUNTER — Emergency Department (HOSPITAL_BASED_OUTPATIENT_CLINIC_OR_DEPARTMENT_OTHER)
Admission: EM | Admit: 2023-03-09 | Discharge: 2023-03-09 | Disposition: A | Discharge status: Home / Self Care | Payer: 59 | Attending: Emergency Medicine | Admitting: Emergency Medicine

## 2023-03-09 ENCOUNTER — Other Ambulatory Visit: Payer: Self-pay

## 2023-03-09 ENCOUNTER — Encounter (HOSPITAL_BASED_OUTPATIENT_CLINIC_OR_DEPARTMENT_OTHER): Payer: Self-pay

## 2023-03-09 DIAGNOSIS — R1084 Generalized abdominal pain: Secondary | ICD-10-CM | POA: Insufficient documentation

## 2023-03-09 DIAGNOSIS — E119 Type 2 diabetes mellitus without complications: Secondary | ICD-10-CM | POA: Insufficient documentation

## 2023-03-09 DIAGNOSIS — I10 Essential (primary) hypertension: Secondary | ICD-10-CM | POA: Insufficient documentation

## 2023-03-09 DIAGNOSIS — Z794 Long term (current) use of insulin: Secondary | ICD-10-CM | POA: Insufficient documentation

## 2023-03-09 DIAGNOSIS — Z79899 Other long term (current) drug therapy: Secondary | ICD-10-CM | POA: Insufficient documentation

## 2023-03-09 DIAGNOSIS — R109 Unspecified abdominal pain: Secondary | ICD-10-CM | POA: Diagnosis present

## 2023-03-09 DIAGNOSIS — R110 Nausea: Secondary | ICD-10-CM

## 2023-03-09 LAB — COMPREHENSIVE METABOLIC PANEL
ALT: 19 U/L (ref 0–44)
AST: 25 U/L (ref 15–41)
Albumin: 3.8 g/dL (ref 3.5–5.0)
Alkaline Phosphatase: 101 U/L (ref 38–126)
Anion gap: 12 (ref 5–15)
BUN: 89 mg/dL — ABNORMAL HIGH (ref 8–23)
CO2: 24 mmol/L (ref 22–32)
Calcium: 9 mg/dL (ref 8.9–10.3)
Chloride: 102 mmol/L (ref 98–111)
Creatinine, Ser: 2.02 mg/dL — ABNORMAL HIGH (ref 0.61–1.24)
GFR, Estimated: 37 mL/min — ABNORMAL LOW (ref >60)
Glucose, Bld: 212 mg/dL — ABNORMAL HIGH (ref 70–99)
Potassium: 3.4 mmol/L — ABNORMAL LOW (ref 3.5–5.1)
Sodium: 138 mmol/L (ref 135–145)
Total Bilirubin: 0.7 mg/dL (ref 0.3–1.2)
Total Protein: 8.8 g/dL — ABNORMAL HIGH (ref 6.5–8.1)

## 2023-03-09 LAB — CBC WITH DIFFERENTIAL/PLATELET
Abs Immature Granulocytes: 0.02 10*3/uL (ref 0.00–0.07)
Basophils Absolute: 0.1 10*3/uL (ref 0.0–0.1)
Basophils Relative: 1 %
Eosinophils Absolute: 0.1 10*3/uL (ref 0.0–0.5)
Eosinophils Relative: 1 %
HCT: 42.6 % (ref 39.0–52.0)
Hemoglobin: 13.5 g/dL (ref 13.0–17.0)
Immature Granulocytes: 0 %
Lymphocytes Relative: 16 %
Lymphs Abs: 1.2 10*3/uL (ref 0.7–4.0)
MCH: 26.4 pg (ref 26.0–34.0)
MCHC: 31.7 g/dL (ref 30.0–36.0)
MCV: 83.4 fL (ref 80.0–100.0)
Monocytes Absolute: 0.6 10*3/uL (ref 0.1–1.0)
Monocytes Relative: 8 %
Neutro Abs: 5.4 10*3/uL (ref 1.7–7.7)
Neutrophils Relative %: 74 %
Platelets: 248 10*3/uL (ref 150–400)
RBC: 5.11 MIL/uL (ref 4.22–5.81)
RDW: 15.3 % (ref 11.5–15.5)
WBC: 7.3 10*3/uL (ref 4.0–10.5)
nRBC: 0 % (ref 0.0–0.2)

## 2023-03-09 LAB — URINALYSIS, ROUTINE W REFLEX MICROSCOPIC
Bilirubin Urine: NEGATIVE
Glucose, UA: 250 mg/dL — AB
Hgb urine dipstick: NEGATIVE
Ketones, ur: NEGATIVE mg/dL
Leukocytes,Ua: NEGATIVE
Nitrite: NEGATIVE
Protein, ur: NEGATIVE mg/dL
Specific Gravity, Urine: 1.015 (ref 1.005–1.030)
pH: 7 (ref 5.0–8.0)

## 2023-03-09 LAB — LIPASE, BLOOD: Lipase: 47 U/L (ref 11–51)

## 2023-03-09 MED ORDER — ALUM & MAG HYDROXIDE-SIMETH 200-200-20 MG/5ML PO SUSP
30.0000 mL | Freq: Once | ORAL | Status: AC
  Administered 2023-03-09: 30 mL via ORAL
  Filled 2023-03-09: qty 30

## 2023-03-09 MED ORDER — ONDANSETRON HCL 4 MG PO TABS: 4.0000 mg | ORAL_TABLET | Freq: Two times a day (BID) | ORAL | 0 refills | Status: AC

## 2023-03-09 MED ORDER — SODIUM CHLORIDE 0.9 % IV BOLUS
1000.0000 mL | Freq: Once | INTRAVENOUS | Status: AC
  Administered 2023-03-09: 1000 mL via INTRAVENOUS

## 2023-03-09 NOTE — ED Provider Notes (Signed)
[facility] [facility] Provider Note   CSN: [id number] Arrival date & time: 03/09/23  1409     History  Chief Complaint  Patient presents with   Abdominal Pain    Gary Hutchinson is a 62 y.o. male with a history of insulin dependent diabetes, morbid obesity, hypertension, atrial fibrillation, iron deficiency anemia, diverticulosis, and constipation sent to the ED today for abdominal discomfort.  Patient reports he is feeling "sick" for the past 2 days with associated loss of appetite, nausea, and constipation.  Reports 1 episode of bilious vomiting.  He denies fever, dark tarry stools or frank blood in stool.  Patient reports that he started Ozempic in January and since then he has had intermittent episodes of nausea and constipation.  Last injection was 2 weeks ago.  Patient takes both MiraLAX and milk of magnesia for constipation with some relief.  Had a small bowel movement this morning.  He denies any acute abdominal pain.  No other complaints or concerns at this time.    Home Medications Prior to Admission medications   Medication Sig Start Date End Date Taking? Authorizing Provider  ondansetron (ZOFRAN) 4 MG tablet Take 1 tablet (4 mg total) by mouth 2 (two) times daily. 03/09/23 04/08/23 Yes Maxwell Marion, [name]  acetaminophen (TYLENOL) 500 MG tablet Take 500 mg by mouth every 6 (six) hours as needed for moderate pain or headache.     [provider]  albuterol (VENTOLIN HFA) 108 (90 Base) MCG/ACT inhaler Inhale 2 puffs into the lungs every 6 (six) hours as needed for wheezing or shortness of breath.    [provider]  allopurinol (ZYLOPRIM) 100 MG tablet Take 100 mg by mouth daily. 12/28/20   [provider]  atorvastatin (LIPITOR) 80 MG tablet Take 80 mg by mouth at bedtime.    [provider]  colchicine 0.6 MG tablet Take 0.6 mg by mouth daily as needed (for gout flares).     [provider]  dapagliflozin  propanediol (FARXIGA) 10 MG TABS tablet Take 10 mg by mouth daily.    [provider]  digoxin (LANOXIN) 0.125 MG tablet Take 0.5 tablets by mouth every other day. 03/15/13   [provider]  ENTRESTO 97-103 MG TAKE 1 TABLET BY MOUTH TWICE A DAY 01/22/22   [name], Bevelyn Buckles, MD  Fluticasone-Salmeterol (ADVAIR) 250-50 MCG/DOSE AEPB Inhale 1 puff into the lungs every 12 (twelve) hours as needed (for flares/shortness of breath). Reported on 02/06/2016    [provider]  hydrALAZINE (APRESOLINE) 25 MG tablet TAKE 1.5 TABLETS (37.5 MG TOTAL) BY MOUTH 3 (THREE) TIMES DAILY. 02/16/23   [name], Bevelyn Buckles, MD  insulin lispro (HUMALOG) 100 UNIT/ML injection Inject 10 Units into the skin See admin instructions. 4 to 5 times daily with a meal, depending on how many meals are eaten that day.    [provider]  isosorbide mononitrate (IMDUR) 30 MG 24 hr tablet Take 1 tablet (30 mg total) by mouth in the morning and at bedtime. 03/25/22   [place], Anderson Malta, FNP  metolazone (ZAROXOLYN) 2.5 MG tablet Take 2.5 mg by mouth daily as needed (for edema).    [provider]  montelukast (SINGULAIR) 10 MG tablet Take 10 mg by mouth as needed (allergies).    [provider]  ondansetron (ZOFRAN-ODT) 8 MG disintegrating tablet Take 1 tablet (8 mg total) by mouth every 8 (eight) hours as needed for nausea or vomiting. 03/23/21  Koleen Distance, MD  OZEMPIC, 1 MG/DOSE, 4 MG/3ML SOPN Inject 1 mg into the skin once a week. 02/26/22   [provider]  sotalol (BETAPACE) 80 MG tablet Take 1 tablet (80 mg total) by mouth 2 (two) times daily. 08/13/22   Graciella Freer, [name]  spironolactone (ALDACTONE) 25 MG tablet Take 12.5 mg by mouth daily.  10/28/15   [provider]  torsemide (DEMADEX) 20 MG tablet TAKE 2 TABLETS (40 MG TOTAL) BY MOUTH EVERY MORNING AND 1 TABLET (20 MG TOTAL) AT BEDTIME. 05/19/22   [name], Bevelyn Buckles, MD  TRESIBA FLEXTOUCH 200 UNIT/ML  FlexTouch Pen Inject 48 Units into the skin at bedtime. 01/07/23   [provider]  XARELTO 20 MG TABS tablet TAKE 1 TABLET BY MOUTH DAILY WITH SUPPER 12/30/22   [name], Bevelyn Buckles, MD      Allergies    Patient has no known allergies.    Review of Systems   Review of Systems  Gastrointestinal:  Positive for abdominal pain.  All other systems reviewed and are negative.   Physical Exam Updated Vital Signs BP 130/72 (BP Location: Left Arm)   Pulse 71 Comment: Simultaneous filing. User may not have seen previous data.  Temp 97.7 F (36.5 C) (Oral)   Resp 20   Ht 5\' 5"  (1.651 m)   Wt (!) 139.7 kg   SpO2 100% Comment: Simultaneous filing. User may not have seen previous data.  BMI 51.25 kg/m  Physical Exam Vitals and nursing note reviewed.  Constitutional:      Appearance: Normal appearance.  HENT:     Head: Normocephalic and atraumatic.     Mouth/Throat:     Mouth: Mucous membranes are moist.  Eyes:     Conjunctiva/sclera: Conjunctivae normal.     Pupils: Pupils are equal, round, and reactive to light.  Cardiovascular:     Rate and Rhythm: Normal rate and regular rhythm.     Pulses: Normal pulses.  Pulmonary:     Effort: Pulmonary effort is normal.     Breath sounds: Normal breath sounds.  Abdominal:     Palpations: Abdomen is soft.     Tenderness: There is generalized abdominal tenderness.  Skin:    General: Skin is warm and dry.     Findings: No rash.  Neurological:     General: No focal deficit present.     Mental Status: He is alert.  Psychiatric:        Mood and Affect: Mood normal.        Behavior: Behavior normal.     ED Results / Procedures / Treatments   Labs (all labs ordered are listed, but only abnormal results are displayed) Labs Reviewed  COMPREHENSIVE METABOLIC PANEL - Abnormal; Notable for the following components:      Result Value   Potassium 3.4 (*)    Glucose, Bld 212 (*)    BUN 89 (*)    Creatinine, Ser 2.02 (*)    Total  Protein 8.8 (*)    GFR, Estimated 37 (*)    All other components within normal limits  URINALYSIS, ROUTINE W REFLEX MICROSCOPIC - Abnormal; Notable for the following components:   Glucose, UA 250 (*)    All other components within normal limits  CBC WITH DIFFERENTIAL/PLATELET  LIPASE, BLOOD    EKG None  Radiology No results found.  Procedures Procedures: not indicated.   Medications Ordered in ED Medications  alum & mag hydroxide-simeth (MAALOX/MYLANTA) 200-200-20 MG/5ML suspension 30  mL (30 mLs Oral Given 03/09/23 1553)  sodium chloride 0.9 % bolus 1,000 mL (0 mLs Intravenous Stopped 03/09/23 1737)    ED Course/ Medical Decision Making/ A&P                             Medical Decision Making Amount and/or Complexity of Data Reviewed Labs: ordered.   Patient presents to the ED for abdominal discomfort. He has had nausea and constipation for the past 3 days with some relief of constipation with Milk of Magnesia use. He reports associated loss of appetite but denies vomiting or fever. Patient reports that he started Ozempic in January and has had these symptoms intermittently since. My differentials include: pancreatitis, cholecystitis, cholangitis, choledocholithiasis, diverticulosis, gastritis, gastroenteritis. GI cocktail given.  On physical exam, patient did not have any acute areas of abdominal pain but diffuse discomfort. I ordered and interpreted labs which showed no electrolyte abnormalities, AKI, or anemia. Patient was slightly dehydrated and received fluids.  He was able to eat in the ED. He's safe for discharge home. Return instructions given. Advised to follow up with PCP if still concerned about Ozempic.         Final Clinical Impression(s) / ED Diagnoses Final diagnoses:  Nausea    Rx / DC Orders ED Discharge Orders          Ordered    ondansetron (ZOFRAN) 4 MG tablet  2 times daily        03/09/23 1750              Maxwell Marion,  [name] 03/09/23 1816    Glyn Ade, MD 03/12/23 [phone]

## 2023-03-09 NOTE — Discharge Instructions (Addendum)
Take Zofran as needed for abdominal pain.  Try bowel cleanse to relieve constipation - mix 1 bottle of Miralax with you're required 64 ounce water intake and drink throughout the day.   Follow up with PCP in 1 week for further evaluation.

## 2023-03-09 NOTE — ED Triage Notes (Addendum)
Pt c/o abdominal pain, started on ozempic in Jan.   States he is probably dehydrated, nausea, constipation and no appetite Has taken several OTC meds without relief. Overall fatigue

## 2023-03-12 NOTE — Progress Notes (Signed)
No ICM remote transmission received for 03/09/2023 and next ICM transmission scheduled for 03/30/2023.   

## 2023-03-24 ENCOUNTER — Other Ambulatory Visit (HOSPITAL_COMMUNITY): Payer: Self-pay | Admitting: Internal Medicine

## 2023-03-27 ENCOUNTER — Other Ambulatory Visit (HOSPITAL_COMMUNITY): Payer: Self-pay | Admitting: Family Medicine

## 2023-03-30 ENCOUNTER — Ambulatory Visit: Payer: 59 | Attending: Cardiology

## 2023-03-30 DIAGNOSIS — I5022 Chronic systolic (congestive) heart failure: Secondary | ICD-10-CM

## 2023-03-30 DIAGNOSIS — Z95810 Presence of automatic (implantable) cardiac defibrillator: Secondary | ICD-10-CM

## 2023-04-01 NOTE — Progress Notes (Signed)
EPIC Encounter for ICM Monitoring  Patient Name: Gary Hutchinson is a 62 y.o. male Date: 04/01/2023 Primary Care Physican: Creola Corn, MD Primary Cardiologist: [name] Electrophysiologist: Elberta Fortis Bi-V Pacing: 98.2%    02/03/2023 Weight: 307-308 lbs 04/01/2023 Weight: 308 lbs   Clinical Status Since 04-Feb-2023 Time in AT/AF  0.0 hr/day (0.0%)                     Spoke with patient and heart failure questions reviewed.  Transmission results reviewed.  Pt asymptomatic for fluid accumulation. He had ED visit 6/3 due to GI problems    Optivol thoracic impedance suggesting normal fluid levels since 6/20 but was possible fluid accumulation from 6/3-6/20.      Prescribed dosage:  Torsemide 20 mg Take 2 tablets (40 mg total) by mouth every morning and 1 tablet (20 total) at bedtime.  He reports Dr Gala Romney has approved that he can take extra Torsemide or Metolazone when needed.  Metolazone 2.5 mg 1 tablet as needed.   Spironolactone 12.5 mg take 1 tablet daily   Labs:   03/09/2023 Creatinine 2.02, BUN 89, Potassium 3.4, Sodium 138  01/19/2023 Creatinine 1.88, BUN 53, Potassium 4.1, Sodium 141, GFR 40 10/27/2022 Creatinine 1.82, BUN 54, Potassium 4.1, Sodium 134, GFR 42 10/17/2022 Creatinine 1.90, BUN 47, Potassium 4.3, Sodium 141 A complete set of results can be found in Results Review.   Recommendations:  No changes and encouraged to call if experiencing any fluid symptoms.   Follow-up plan: ICM clinic phone appointment on 04/20/2023.   91 day device clinic remote transmission 05/06/2023.     EP/Cardiology Office Visits:  04/27/2023 with HF clinic.  Recall 07/18/2023 with Dr Elberta Fortis.     Copy of ICM check sent to Dr. Elberta Fortis.   3 month ICM trend: 03/30/2023.    12-14 Month ICM trend:     Karie Soda, RN 04/01/2023 12:49 PM

## 2023-04-20 ENCOUNTER — Ambulatory Visit: Payer: 59 | Attending: Cardiology

## 2023-04-20 DIAGNOSIS — Z95810 Presence of automatic (implantable) cardiac defibrillator: Secondary | ICD-10-CM | POA: Diagnosis not present

## 2023-04-20 DIAGNOSIS — I5022 Chronic systolic (congestive) heart failure: Secondary | ICD-10-CM

## 2023-04-21 NOTE — Progress Notes (Signed)
EPIC Encounter for ICM Monitoring  Patient Name: Gary Hutchinson is a 62 y.o. male Date: 04/21/2023 Primary Care Physican: Creola Corn, MD Primary Cardiologist: [name] Electrophysiologist: Elberta Fortis Bi-V Pacing: 98.0%    02/03/2023 Weight: 307-308 lbs 04/01/2023 Weight: 308 lbs    Clinical Status Since Since 30-Mar-2023 Time in AT/AF  0.0 hr/day (0.0%)                     Spoke with patient and heart failure questions reviewed.  Transmission results reviewed.  Pt asymptomatic for fluid accumulation.  Reports feeling well at this time and voices no complaints.  He stays busy and been eating out more, snacking at night and drinking more fluids due to the heat.    Diet: Not limiting salt or fluids at this time  Optivol thoracic impedance suggesting possible fluid accumulation alternating return to baseline pattern. Trending back toward baseline.  Difficulty maintaining stable fluid levels.      Prescribed dosage:  Torsemide 20 mg Take 2 tablets (40 mg total) by mouth every morning and 1 tablet (20 total) at bedtime.  He reports Dr Gala Romney has approved that he can take extra Torsemide or Metolazone when needed.  Metolazone 2.5 mg 1 tablet as needed.   Spironolactone 12.5 mg take 1 tablet daily   Labs:   03/09/2023 Creatinine 2.02, BUN 89, Potassium 3.4, Sodium 138  01/19/2023 Creatinine 1.88, BUN 53, Potassium 4.1, Sodium 141, GFR 40 10/27/2022 Creatinine 1.82, BUN 54, Potassium 4.1, Sodium 134, GFR 42 10/17/2022 Creatinine 1.90, BUN 47, Potassium 4.3, Sodium 141 A complete set of results can be found in Results Review.   Recommendations:  Recommendation to limit salt intake to 2000 mg daily and fluid intake to 64 oz daily.  Encouraged to call if experiencing any fluid symptoms.    Follow-up plan: ICM clinic phone appointment on 05/04/2023 to recheck fluid levels.   91 day device clinic remote transmission 05/06/2023.     EP/Cardiology Office Visits:  04/27/2023 with HF clinic.  Recall  07/18/2023 with Dr Elberta Fortis.     Copy of ICM check sent to Dr. Elberta Fortis.    3 month ICM trend: 04/20/2023.    12-14 Month ICM trend:     Karie Soda, RN 04/21/2023 2:25 PM

## 2023-04-27 ENCOUNTER — Ambulatory Visit (HOSPITAL_COMMUNITY): Admission: RE | Admit: 2023-04-27 | Payer: 59 | Source: Ambulatory Visit

## 2023-04-27 ENCOUNTER — Encounter (HOSPITAL_COMMUNITY): Payer: Self-pay

## 2023-04-27 VITALS — BP 116/62 | HR 76 | Wt 304.4 lb

## 2023-04-27 DIAGNOSIS — I272 Pulmonary hypertension, unspecified: Secondary | ICD-10-CM | POA: Diagnosis not present

## 2023-04-27 DIAGNOSIS — I5022 Chronic systolic (congestive) heart failure: Secondary | ICD-10-CM | POA: Diagnosis present

## 2023-04-27 DIAGNOSIS — E1122 Type 2 diabetes mellitus with diabetic chronic kidney disease: Secondary | ICD-10-CM | POA: Diagnosis not present

## 2023-04-27 DIAGNOSIS — I428 Other cardiomyopathies: Secondary | ICD-10-CM | POA: Insufficient documentation

## 2023-04-27 DIAGNOSIS — Z7984 Long term (current) use of oral hypoglycemic drugs: Secondary | ICD-10-CM | POA: Insufficient documentation

## 2023-04-27 DIAGNOSIS — E0580 Other thyrotoxicosis without thyrotoxic crisis or storm: Secondary | ICD-10-CM

## 2023-04-27 DIAGNOSIS — G4733 Obstructive sleep apnea (adult) (pediatric): Secondary | ICD-10-CM | POA: Diagnosis not present

## 2023-04-27 DIAGNOSIS — I10 Essential (primary) hypertension: Secondary | ICD-10-CM

## 2023-04-27 DIAGNOSIS — N183 Chronic kidney disease, stage 3 unspecified: Secondary | ICD-10-CM | POA: Diagnosis not present

## 2023-04-27 DIAGNOSIS — Z794 Long term (current) use of insulin: Secondary | ICD-10-CM

## 2023-04-27 DIAGNOSIS — Z6841 Body Mass Index (BMI) 40.0 and over, adult: Secondary | ICD-10-CM | POA: Insufficient documentation

## 2023-04-27 DIAGNOSIS — N1832 Chronic kidney disease, stage 3b: Secondary | ICD-10-CM | POA: Diagnosis not present

## 2023-04-27 DIAGNOSIS — Z8616 Personal history of COVID-19: Secondary | ICD-10-CM | POA: Insufficient documentation

## 2023-04-27 DIAGNOSIS — Z79899 Other long term (current) drug therapy: Secondary | ICD-10-CM | POA: Diagnosis not present

## 2023-04-27 DIAGNOSIS — I480 Paroxysmal atrial fibrillation: Secondary | ICD-10-CM | POA: Diagnosis not present

## 2023-04-27 DIAGNOSIS — Z7901 Long term (current) use of anticoagulants: Secondary | ICD-10-CM | POA: Insufficient documentation

## 2023-04-27 DIAGNOSIS — I130 Hypertensive heart and chronic kidney disease with heart failure and stage 1 through stage 4 chronic kidney disease, or unspecified chronic kidney disease: Secondary | ICD-10-CM | POA: Diagnosis not present

## 2023-04-27 DIAGNOSIS — Z95810 Presence of automatic (implantable) cardiac defibrillator: Secondary | ICD-10-CM | POA: Insufficient documentation

## 2023-04-27 LAB — BASIC METABOLIC PANEL
Anion gap: 7 (ref 5–15)
BUN: 39 mg/dL — ABNORMAL HIGH (ref 8–23)
CO2: 27 mmol/L (ref 22–32)
Calcium: 9.2 mg/dL (ref 8.9–10.3)
Chloride: 103 mmol/L (ref 98–111)
Creatinine, Ser: 1.8 mg/dL — ABNORMAL HIGH (ref 0.61–1.24)
GFR, Estimated: 42 mL/min — ABNORMAL LOW (ref >60)
Glucose, Bld: 134 mg/dL — ABNORMAL HIGH (ref 70–99)
Potassium: 3.9 mmol/L (ref 3.5–5.1)
Sodium: 137 mmol/L (ref 135–145)

## 2023-04-27 LAB — DIGOXIN LEVEL: Digoxin Level: 0.6 ng/mL — ABNORMAL LOW (ref 0.8–2.0)

## 2023-04-27 LAB — BRAIN NATRIURETIC PEPTIDE: B Natriuretic Peptide: 57.6 pg/mL (ref 0.0–100.0)

## 2023-04-27 MED ORDER — POTASSIUM CHLORIDE CRYS ER 20 MEQ PO TBCR: 40.0000 meq | EXTENDED_RELEASE_TABLET | ORAL | 3 refills | Status: DC

## 2023-04-27 NOTE — Patient Instructions (Addendum)
Thank you for coming in today  If you had labs drawn today, any labs that are abnormal the clinic will call you No news is good news  Medications: Please make sure when you take your Metolazone you take 40 meq of Potassium  Follow up appointments:  Your physician recommends that you schedule a follow-up appointment in:  6 months With Dr. Gala Romney with echocardiogram You will receive a reminder letter in the mail a few months in advance. If you don't receive a letter, please call our office to schedule the follow-up appointment.  Your physician has requested that you have an echocardiogram. Echocardiography is a painless test that uses sound waves to create images of your heart. It provides your doctor with information about the size and shape of your heart and how well your heart's chambers and valves are working. This procedure takes approximately one hour. There are no restrictions for this procedure.      Do the following things EVERYDAY: Weigh yourself in the morning before breakfast. Write it down and keep it in a log. Take your medicines as prescribed Eat low salt foods--Limit salt (sodium) to 2000 mg per day.  Stay as active as you can everyday Limit all fluids for the day to less than 2 liters   At the [facility], you and your health needs are our priority. As part of our continuing mission to provide you with exceptional heart care, we have created designated Provider Care Teams. These Care Teams include your primary Cardiologist (physician) and Advanced Practice Providers (APPs- Physician Assistants and Nurse Practitioners) who all work together to provide you with the care you need, when you need it.   You may see any of the following providers on your designated Care Team at your next follow up: Dr Arvilla Meres Dr Marca Ancona Dr. Marcos Eke, NP Robbie Lis, [place] [facility] [place], [place] Brynda Peon, NP Karle Plumber, PharmD   Please be sure to bring in all your medications bottles to every appointment.    Thank you for choosing [facility]  If you have any questions or concerns before your next appointment please send [place] a message through [place] or call our office at [phone].    TO LEAVE A MESSAGE FOR THE NURSE SELECT OPTION 2, PLEASE LEAVE A MESSAGE INCLUDING: YOUR NAME DATE OF BIRTH CALL BACK NUMBER REASON FOR CALL**this is important as we prioritize the call backs  YOU WILL RECEIVE A CALL BACK THE SAME DAY AS LONG AS YOU CALL BEFORE 4:00 PM

## 2023-04-27 NOTE — Progress Notes (Signed)
Patient ID: Gary Hutchinson, male   DOB: [birth year], 62 y.o.   MRN: [id number]    [facility] Note   PCP: Creola Corn EP: [facility] HF Cardiology: Dr. Gala Romney  HPI: Gary Hutchinson is a 62 y.o. male with a history of chronic systolic heart failure due to NICM s/p ICD, HTN, DM II, OSA on CPAP, history of ventricular tachycardia status post AICD placement in 2009, atrial fibrillation and morbid obesity.    LHC 5/12 No CAD  Admitted 6/15 for A/C HF and cardiogenic shock (co-ox 42%). Diuresed with IV lasix and milrinone which were weaned off.  He went into Afib with RVR and chemically cardioverted back to NSR with IV amiodarone.   RHC 8/15: Left sided filling pressures well compensated, mild pulm HTN with elevated right-sided pressures and mod/severely depressed CO. Discussion about trial of milrinone but patient wanted to hold off.  Has h/o AF/AFL s/p multiple DCCVs last oon 07/29/2017.  Echo 6/23 EF 32% RV mildly reduced Moderate MR  Follow up 1/24, had COVID 12/23 and has in AF for 2 weeks resulting in loss of BIV pacing and volume overload. Back in NSR.   Today she returns for HF follow up. Overall feeling fine. Fluid has been up and down, but trying to better with fluid intake. Uses metolazone about once a month. He is not SOB with activity, walks to the grocery store. Denies palpitations, abnormal bleeding, CP, dizziness, edema, or PND/Orthopnea. Appetite ok. No fever or chills. Weight at home 298 pounds. Taking all medications. Wears BiPap  Cardiac Studies:   - Echo (7/13): EF 25-30%.  Grade 1 diastolic dysfunction.  Mild MR.  Mod dilated LA.    - Echo (1/17): EF ~40%. RV mildly dilated.   - Echo (8/17): EF 25-35%  - Echo (10/18): EF  25- 30%  - Echo (3/21):EF ~30% RV ok.   - Echo (6/23): EF 32%, RV mildly reduced, moderate MR  SH: Lives with son in [place]. Disabled. No ETOH or tobacco abuse  FH; Mother living: HT        Father deceased: was not  part of his life so not sure health issues  Review of systems complete and found to be negative unless listed in HPI.   Past Medical History:  Diagnosis Date   AICD (automatic cardioverter/defibrillator) present    Medtronic   Alcohol abuse    now quit   Anemia    iron defi   Arthritis    "fingers, knees, some days all my joints ache" (11/16/2015)   Asthma    Cholecystitis    Chronic systolic congestive heart failure (HCC)    a. RHC (02/2011) RA 31/27, RV 71/27, PA67/45, PCWP 46, PA 49%, Fick CO: 3.4 b. ECHO (03/2014) EF 30-35%, grade II DD, mild MR, RV poorly visualized appears mildly decreased c. RHC (05/2014) RA 13, RV 54/4/11, PA 60/22 (37), PCWP 17, Fick CO/CI: 4.4 / 1.9, Thermo CO/CI: 3.8 /1.6, PVR 5.2 WU, PA 57% and 59%   Diverticulosis of colon    Gout    Hemorrhoids, internal    Hyperlipidemia    Hypertension    Morbid obesity (HCC)    Nonischemic cardiomyopathy (HCC)    a. LHC (02/2011) normal coronaries   OSA on CPAP    Paroxysmal atrial fibrillation (HCC)    Pneumonia 2000s X 1   Pulmonary hypertension (HCC)    Renal insufficiency    Type II diabetes mellitus (HCC)    Ventricular  tachycardia (HCC)    s/p MDT ICD implant   Current Outpatient Medications  Medication Sig Dispense Refill   allopurinol (ZYLOPRIM) 100 MG tablet Take 100 mg by mouth daily.     atorvastatin (LIPITOR) 80 MG tablet Take 80 mg by mouth at bedtime.     colchicine 0.6 MG tablet Take 0.6 mg by mouth daily as needed (for gout flares).      dapagliflozin propanediol (FARXIGA) 10 MG TABS tablet Take 10 mg by mouth daily.     digoxin (LANOXIN) 0.125 MG tablet Take 0.5 tablets by mouth every other day.     ENTRESTO 97-103 MG TAKE 1 TABLET BY MOUTH TWICE A DAY 180 tablet 3   Fluticasone-Salmeterol (ADVAIR) 250-50 MCG/DOSE AEPB Inhale 1 puff into the lungs every 12 (twelve) hours as needed (for flares/shortness of breath). Reported on 02/06/2016     hydrALAZINE (APRESOLINE) 25 MG tablet TAKE 1.5 TABLETS  (37.5 MG TOTAL) BY MOUTH 3 (THREE) TIMES DAILY. 405 tablet 3   insulin lispro (HUMALOG) 100 UNIT/ML injection Inject 10 Units into the skin See admin instructions. 4 to 5 times daily with a meal, depending on how many meals are eaten that day.     isosorbide mononitrate (IMDUR) 30 MG 24 hr tablet TAKE 1 TABLET (30 MG TOTAL) BY MOUTH IN THE MORNING AND AT BEDTIME. 180 tablet 3   metolazone (ZAROXOLYN) 2.5 MG tablet Take 2.5 mg by mouth daily as needed (for edema).     montelukast (SINGULAIR) 10 MG tablet Take 10 mg by mouth as needed (allergies).     ondansetron (ZOFRAN-ODT) 8 MG disintegrating tablet Take 1 tablet (8 mg total) by mouth every 8 (eight) hours as needed for nausea or vomiting. 20 tablet 0   OZEMPIC, 1 MG/DOSE, 4 MG/3ML SOPN Inject 1 mg into the skin once a week.     sotalol (BETAPACE) 80 MG tablet Take 1 tablet (80 mg total) by mouth 2 (two) times daily. 180 tablet 2   spironolactone (ALDACTONE) 25 MG tablet Take 12.5 mg by mouth daily.      torsemide (DEMADEX) 20 MG tablet TAKE 2 TABLETS (40 MG TOTAL) BY MOUTH EVERY MORNING AND 1 TABLET (20 MG TOTAL) AT BEDTIME. 270 tablet 3   TRESIBA FLEXTOUCH 200 UNIT/ML FlexTouch Pen Inject 48 Units into the skin at bedtime.     XARELTO 20 MG TABS tablet TAKE 1 TABLET BY MOUTH DAILY WITH SUPPER 90 tablet 3   acetaminophen (TYLENOL) 500 MG tablet Take 500 mg by mouth every 6 (six) hours as needed for moderate pain or headache.      albuterol (VENTOLIN HFA) 108 (90 Base) MCG/ACT inhaler Inhale 2 puffs into the lungs every 6 (six) hours as needed for wheezing or shortness of breath.     No current facility-administered medications for this encounter.   BP 116/62   Pulse 76   Wt (!) 138.1 kg (304 lb 6.4 oz)   SpO2 97%   BMI 50.65 kg/m   Wt Readings from Last 3 Encounters:  04/27/23 (!) 138.1 kg (304 lb 6.4 oz)  03/09/23 (!) 139.7 kg (308 lb)  01/19/23 (!) 139.7 kg (308 lb)   Physical Exam:  General:  NAD. No resp difficulty, walked into  clinic HEENT: Normal Neck: Supple. No JVD. Carotids 2+ bilat; no bruits. No lymphadenopathy or thryomegaly appreciated. Cor: PMI nondisplaced. Regular rate & rhythm. No rubs, gallops or murmurs. Lungs: Clear Abdomen: Soft, obese, nontender, nondistended. No hepatosplenomegaly. No bruits or masses. Good  bowel sounds. Extremities: No cyanosis, clubbing, rash, edema Neuro: Alert & oriented x 3, cranial nerves grossly intact. Moves all 4 extremities w/o difficulty. Affect pleasant.  Device Interrogation (personally reviewed): OptiVol creeping up, no VT or AF, 1.3 hr/day activity, 98% BiV pacing  ASSESSMENT & PLAN: 1) Chronic systolic HF: due to NICM s/p BiV Medtronic - LHC 5/12 No CAD - Echo 10/18 LVEF 25-30%.  - Echo 3/21 EF 30% - Echo (03/11/22) EF 31% RV mildly down  Moderate MR - s/p CRT-D  - Stable NYHA I- II. Volume status stable on exam, slowly creeping up on OptiVol. - Discussed limiting fluid intake - Continue torsemide 40 mg am/20 mg pm. OK to take metolazone PRN. - Continue Entresto 97/103 mg bid. - Continue spiro 12.5 mg daily. - Off bisoprolol in 2018. On sotalol 80 mg bid.  - Continue digoxin 0.0625 mg every other day.  - Continue hydralazine 37.5 mg tid + Imdur 30 mg bid. - Continue Farxiga 10 mg daily. No GU symptoms. - Labs today. - Update echo next visit.  2) CKD stage III  - Followed previously by nephrology. - Baseline Creatinine 1.5-1.9 - Continue Farxiga - Labs today.  3) HTN  - Stable today.  - Continue GDMT as above  4) OSA  - Continue Bipap.  - Compliant.   5) PAF/AFL - S/P multiple DCCVs. Most recent 07/29/17.  - Had episode of AFL with ICD shock in 2/18 at time of influenza - Pt refused ablation consideration.  - CHA2DS2VASC of at least 3. - Continue Xarelto 20 mg daily. Denies bleeding. Recent CBC stable. - Continue Sotalol per EP.  - Recent AF in 12/23-1/24 in setting of COVID, none since.    6) Obesity   - Body mass index is 50.65 kg/m.   - On Ozempic  7) Amiodarone thyroxicity   - Off Amiodarone. Completed Methimazole treatment per PCP.   - No change.  8) DM2 - Continue insulin + Farxiga. - Followed by PCP.  Follow up in 6 months with Dr. Gala Romney + echo.  Anderson Malta [place], FNP  04/27/2023 11:17 AM

## 2023-05-04 ENCOUNTER — Ambulatory Visit: Payer: 59 | Attending: Cardiology

## 2023-05-04 DIAGNOSIS — Z95810 Presence of automatic (implantable) cardiac defibrillator: Secondary | ICD-10-CM

## 2023-05-04 DIAGNOSIS — I5022 Chronic systolic (congestive) heart failure: Secondary | ICD-10-CM

## 2023-05-06 ENCOUNTER — Ambulatory Visit (INDEPENDENT_AMBULATORY_CARE_PROVIDER_SITE_OTHER): Payer: 59

## 2023-05-06 ENCOUNTER — Telehealth: Payer: Self-pay

## 2023-05-06 DIAGNOSIS — I428 Other cardiomyopathies: Secondary | ICD-10-CM | POA: Diagnosis not present

## 2023-05-06 LAB — CUP PACEART REMOTE DEVICE CHECK
Battery Remaining Longevity: 11 mo
Battery Voltage: 2.86 V
Brady Statistic AP VP Percent: 0.15 %
Brady Statistic AP VS Percent: 0.02 %
Brady Statistic AS VP Percent: 98.37 %
Brady Statistic AS VS Percent: 1.45 %
Brady Statistic RA Percent Paced: 0.17 %
Brady Statistic RV Percent Paced: 17.67 %
Date Time Interrogation Session: 20240731033427
HighPow Impedance: 94 Ohm
Implantable Lead Connection Status: 753985
Implantable Lead Connection Status: 753985
Implantable Lead Connection Status: 753985
Implantable Lead Implant Date: 20090901
Implantable Lead Implant Date: 20170210
Implantable Lead Implant Date: 20170210
Implantable Lead Location: 753858
Implantable Lead Location: 753859
Implantable Lead Location: 753860
Implantable Lead Model: 4598
Implantable Lead Model: 5076
Implantable Lead Model: 6935
Implantable Pulse Generator Implant Date: 20170210
Lead Channel Impedance Value: 1121 Ohm
Lead Channel Impedance Value: 1140 Ohm
Lead Channel Impedance Value: 1178 Ohm
Lead Channel Impedance Value: 342 Ohm
Lead Channel Impedance Value: 361 Ohm
Lead Channel Impedance Value: 418 Ohm
Lead Channel Impedance Value: 475 Ohm
Lead Channel Impedance Value: 513 Ohm
Lead Channel Impedance Value: 551 Ohm
Lead Channel Impedance Value: 646 Ohm
Lead Channel Impedance Value: 722 Ohm
Lead Channel Impedance Value: 836 Ohm
Lead Channel Impedance Value: 893 Ohm
Lead Channel Pacing Threshold Amplitude: 0.625 V
Lead Channel Pacing Threshold Amplitude: 1 V
Lead Channel Pacing Threshold Amplitude: 1.875 V
Lead Channel Pacing Threshold Pulse Width: 0.4 ms
Lead Channel Pacing Threshold Pulse Width: 0.4 ms
Lead Channel Pacing Threshold Pulse Width: 0.4 ms
Lead Channel Sensing Intrinsic Amplitude: 17.125 mV
Lead Channel Sensing Intrinsic Amplitude: 17.125 mV
Lead Channel Sensing Intrinsic Amplitude: 2.125 mV
Lead Channel Sensing Intrinsic Amplitude: 2.125 mV
Lead Channel Setting Pacing Amplitude: 1.5 V
Lead Channel Setting Pacing Amplitude: 2 V
Lead Channel Setting Pacing Amplitude: 3 V
Lead Channel Setting Pacing Pulse Width: 0.4 ms
Lead Channel Setting Pacing Pulse Width: 0.4 ms
Lead Channel Setting Sensing Sensitivity: 0.3 mV
Zone Setting Status: 755011
Zone Setting Status: 755011

## 2023-05-06 NOTE — Progress Notes (Signed)
EPIC Encounter for ICM Monitoring  Patient Name: Gary Hutchinson is a 62 y.o. male Date: 05/06/2023 Primary Care Physican: Creola Corn, MD Primary Cardiologist: [name] Electrophysiologist: Elberta Fortis Bi-V Pacing: 98.3%    02/03/2023 Weight: 307-308 lbs 04/01/2023 Weight: 308 lbs     Clinical Status Since Since 30-Mar-2023 Time in AT/AF  0.0 hr/day (0.0%)                     Attempted call to patient and unable to reach.  Left detailed message per DPR regarding transmission. Transmission reviewed.    Diet: Not limiting salt or fluids at this time   Optivol thoracic impedance suggesting fluid levels returned to normal.  Difficulty maintaining stable fluid levels.      Prescribed dosage:  Torsemide 20 mg Take 2 tablets (40 mg total) by mouth every morning and 1 tablet (20 total) at bedtime.  He reports Dr Gala Romney has approved that he can take extra Torsemide or Metolazone when needed.  Metolazone 2.5 mg 1 tablet as needed.   Spironolactone 12.5 mg take 1 tablet daily   Labs:   04/27/2023 Creatinine 1.80, BUN 39, Potassium 3.9, Sodium 137, GFR 42 03/09/2023 Creatinine 2.02, BUN 89, Potassium 3.4, Sodium 138  01/19/2023 Creatinine 1.88, BUN 53, Potassium 4.1, Sodium 141, GFR 40 10/27/2022 Creatinine 1.82, BUN 54, Potassium 4.1, Sodium 134, GFR 42 10/17/2022 Creatinine 1.90, BUN 47, Potassium 4.3, Sodium 141 A complete set of results can be found in Results Review.   Recommendations:  Left voice mail with ICM number and encouraged to call if experiencing any fluid symptoms.    Follow-up plan: ICM clinic phone appointment on 05/25/2023.   91 day device clinic remote transmission 08/05/2023.     EP/Cardiology Office Visits:  Recall 10/24/2023 with Dr Gala Romney.  Recall 07/18/2023 with Dr Elberta Fortis.     Copy of ICM check sent to Dr. Elberta Fortis.    3 month ICM trend: 05/06/2023.    12-14 Month ICM trend:     Karie Soda, RN 05/06/2023 12:32 PM

## 2023-05-06 NOTE — Telephone Encounter (Signed)
 Remote ICM transmission received.  Attempted call to patient regarding ICM remote transmission and left detailed message per DPR.  Left ICM phone number and advised to return call for any fluid symptoms or questions. Next ICM remote transmission scheduled 05/25/2023.

## 2023-05-15 ENCOUNTER — Encounter: Payer: Self-pay | Admitting: Cardiology

## 2023-05-20 NOTE — Progress Notes (Signed)
Remote ICD transmission.   

## 2023-05-28 NOTE — Progress Notes (Signed)
No ICM remote transmission received for 05/25/2023 and next ICM transmission scheduled for 06/15/2023.

## 2023-06-15 ENCOUNTER — Ambulatory Visit (INDEPENDENT_AMBULATORY_CARE_PROVIDER_SITE_OTHER): Payer: 59

## 2023-06-15 DIAGNOSIS — Z95810 Presence of automatic (implantable) cardiac defibrillator: Secondary | ICD-10-CM

## 2023-06-15 DIAGNOSIS — I5022 Chronic systolic (congestive) heart failure: Secondary | ICD-10-CM | POA: Diagnosis not present

## 2023-06-19 NOTE — Progress Notes (Signed)
EPIC Encounter for ICM Monitoring  Patient Name: Gary Hutchinson is a 62 y.o. male Date: 06/19/2023 Primary Care Physican: Creola Corn, MD Primary Cardiologist: [name] Electrophysiologist: Elberta Fortis Bi-V Pacing: 98.3%    02/03/2023 Weight: 307-308 lbs 04/01/2023 Weight: 308 lbs 06/19/2023 Weight: 305 lbs   Clinical Status Since 06-May-2023 Time in AT/AF  0.0 hr/day (0.0%)                     Spoke with patient and heart failure questions reviewed.  Transmission results reviewed.  Pt asymptomatic for fluid accumulation.  Reports feeling well at this time and voices no complaints.     Diet: Not limiting salt or fluids at this time   Optivol thoracic impedance suggesting intermittent days with possible fluid accumulation.  Difficulty maintaining stable fluid levels.      Prescribed dosage:  Torsemide 20 mg Take 2 tablets (40 mg total) by mouth every morning and 1 tablet (20 total) at bedtime.  He reports Dr Gala Romney has approved that he can take extra Torsemide or Metolazone when needed.  Metolazone 2.5 mg 1 tablet as needed.   Spironolactone 12.5 mg take 1 tablet daily   Labs:   04/27/2023 Creatinine 1.80, BUN 39, Potassium 3.9, Sodium 137, GFR 42 03/09/2023 Creatinine 2.02, BUN 89, Potassium 3.4, Sodium 138  01/19/2023 Creatinine 1.88, BUN 53, Potassium 4.1, Sodium 141, GFR 40 10/27/2022 Creatinine 1.82, BUN 54, Potassium 4.1, Sodium 134, GFR 42 10/17/2022 Creatinine 1.90, BUN 47, Potassium 4.3, Sodium 141 A complete set of results can be found in Results Review.   Recommendations:  No changes and encouraged to call if experiencing any fluid symptoms.    Follow-up plan: ICM clinic phone appointment on 07/20/2023.   91 day device clinic remote transmission 08/05/2023.     EP/Cardiology Office Visits:  Recall 10/24/2023 with Dr Gala Romney.  Recall 07/18/2023 with Dr Elberta Fortis.     Copy of ICM check sent to Dr. Elberta Fortis.    3 month ICM trend: 06/15/2023.    12-14 Month ICM trend:      Karie Soda, RN 06/19/2023 2:35 PM

## 2023-06-20 ENCOUNTER — Other Ambulatory Visit: Payer: Self-pay | Admitting: Student

## 2023-07-20 ENCOUNTER — Encounter: Payer: 59 | Admitting: Student

## 2023-07-20 ENCOUNTER — Ambulatory Visit: Payer: 59 | Attending: Cardiology

## 2023-07-20 DIAGNOSIS — I5022 Chronic systolic (congestive) heart failure: Secondary | ICD-10-CM

## 2023-07-20 DIAGNOSIS — Z95810 Presence of automatic (implantable) cardiac defibrillator: Secondary | ICD-10-CM | POA: Diagnosis not present

## 2023-07-21 ENCOUNTER — Ambulatory Visit: Payer: 59 | Attending: Student | Admitting: Student

## 2023-07-21 ENCOUNTER — Encounter: Payer: Self-pay | Admitting: Student

## 2023-07-21 VITALS — BP 124/60 | HR 82 | Ht 65.0 in | Wt 312.0 lb

## 2023-07-21 DIAGNOSIS — I472 Ventricular tachycardia, unspecified: Secondary | ICD-10-CM

## 2023-07-21 DIAGNOSIS — I10 Essential (primary) hypertension: Secondary | ICD-10-CM | POA: Diagnosis not present

## 2023-07-21 DIAGNOSIS — N1832 Chronic kidney disease, stage 3b: Secondary | ICD-10-CM

## 2023-07-21 DIAGNOSIS — Z95810 Presence of automatic (implantable) cardiac defibrillator: Secondary | ICD-10-CM

## 2023-07-21 DIAGNOSIS — I428 Other cardiomyopathies: Secondary | ICD-10-CM | POA: Diagnosis not present

## 2023-07-21 DIAGNOSIS — I5022 Chronic systolic (congestive) heart failure: Secondary | ICD-10-CM

## 2023-07-21 LAB — CUP PACEART INCLINIC DEVICE CHECK
Battery Remaining Longevity: 6 mo
Battery Voltage: 2.84 V
Brady Statistic AP VP Percent: 0.32 %
Brady Statistic AP VS Percent: 0.02 %
Brady Statistic AS VP Percent: 98.24 %
Brady Statistic AS VS Percent: 1.43 %
Brady Statistic RA Percent Paced: 0.33 %
Brady Statistic RV Percent Paced: 14.67 %
Date Time Interrogation Session: 20241015095410
HighPow Impedance: 80 Ohm
Implantable Lead Connection Status: 753985
Implantable Lead Connection Status: 753985
Implantable Lead Connection Status: 753985
Implantable Lead Implant Date: 20090901
Implantable Lead Implant Date: 20170210
Implantable Lead Implant Date: 20170210
Implantable Lead Location: 753858
Implantable Lead Location: 753859
Implantable Lead Location: 753860
Implantable Lead Model: 4598
Implantable Lead Model: 5076
Implantable Lead Model: 6935
Implantable Pulse Generator Implant Date: 20170210
Lead Channel Impedance Value: 1121 Ohm
Lead Channel Impedance Value: 1178 Ohm
Lead Channel Impedance Value: 1197 Ohm
Lead Channel Impedance Value: 342 Ohm
Lead Channel Impedance Value: 361 Ohm
Lead Channel Impedance Value: 418 Ohm
Lead Channel Impedance Value: 475 Ohm
Lead Channel Impedance Value: 475 Ohm
Lead Channel Impedance Value: 551 Ohm
Lead Channel Impedance Value: 665 Ohm
Lead Channel Impedance Value: 817 Ohm
Lead Channel Impedance Value: 817 Ohm
Lead Channel Impedance Value: 931 Ohm
Lead Channel Pacing Threshold Amplitude: 0.625 V
Lead Channel Pacing Threshold Amplitude: 0.875 V
Lead Channel Pacing Threshold Amplitude: 1.75 V
Lead Channel Pacing Threshold Pulse Width: 0.4 ms
Lead Channel Pacing Threshold Pulse Width: 0.4 ms
Lead Channel Pacing Threshold Pulse Width: 0.4 ms
Lead Channel Sensing Intrinsic Amplitude: 12.875 mV
Lead Channel Sensing Intrinsic Amplitude: 14.5 mV
Lead Channel Sensing Intrinsic Amplitude: 2.125 mV
Lead Channel Sensing Intrinsic Amplitude: 2.75 mV
Lead Channel Setting Pacing Amplitude: 1.5 V
Lead Channel Setting Pacing Amplitude: 2 V
Lead Channel Setting Pacing Amplitude: 2.75 V
Lead Channel Setting Pacing Pulse Width: 0.4 ms
Lead Channel Setting Pacing Pulse Width: 0.4 ms
Lead Channel Setting Sensing Sensitivity: 0.3 mV
Zone Setting Status: 755011
Zone Setting Status: 755011

## 2023-07-21 NOTE — Patient Instructions (Signed)
Medication Instructions:  Your physician recommends that you continue on your current medications as directed. Please refer to the Current Medication list given to you today.  *If you need a refill on your cardiac medications before your next appointment, please call your pharmacy*  Lab Work: BMET, MAG-TODAY If you have labs (blood work) drawn today and your tests are completely normal, you will receive your results only by: MyChart Message (if you have MyChart) OR A paper copy in the mail If you have any lab test that is abnormal or we need to change your treatment, we will call you to review the results.  Follow-Up: At [facility], you and your health needs are our priority.  As part of our continuing mission to provide you with exceptional heart care, we have created designated Provider Care Teams.  These Care Teams include your primary Cardiologist (physician) and Advanced Practice Providers (APPs -  Physician Assistants and Nurse Practitioners) who all work together to provide you with the care you need, when you need it.  Your next appointment:   6 month(s) schedule for beginning of May  Provider:   Loman Brooklyn, MD

## 2023-07-21 NOTE — Progress Notes (Signed)
Electrophysiology Office Note:   ID:  Gary Hutchinson, DOB 07-05-1961, MRN [id number]  Primary Cardiologist: None Electrophysiologist: Will Jorja Loa, MD      History of Present Illness:   Gary Hutchinson is a 62 y.o. male with h/o HFrEF, LBBB s/p CRT-D, VT, parox AF, and HTN seen today for routine electrophysiology followup.   Since last being seen in our clinic the patient reports doing well overall. No new complaints. Watching weight and fluid intake. Uses metolazone about once monthly.  he denies chest pain, palpitations, dyspnea, PND, orthopnea, nausea, vomiting, dizziness, syncope, edema, weight gain, or early satiety.   Review of systems complete and found to be negative unless listed in HPI.   EP Information / Studies Reviewed:    EKG is ordered today. Personal review as below.  EKG Interpretation Date/Time:  Tuesday July 21 2023 09:35:56 EDT Ventricular Rate:  82 PR Interval:  194 QRS Duration:  98 QT Interval:  376 QTC Calculation: 439 R Axis:   239  Text Interpretation: Atrial-sensed ventricular-paced rhythm Confirmed by Maxine Glenn [phone]) on 07/21/2023 9:39:32 AM    ICD Interrogation-  reviewed in detail today,  See PACEART report.  Device History: MDT CRT-D, impl 2017; dx CHF + inappropriate shock for rapid atypical Aflutter in sitting of influenza + inappropriate shock for 1:1 aflutter 09/2017   AAD History: Amiodarone stopped d/t thyrotoxicity Sotalol initiated 09/2017  Physical Exam:   VS:  BP 124/60   Pulse 82   Ht 5\' 5"  (1.651 m)   Wt (!) 312 lb (141.5 kg)   SpO2 99%   BMI 51.92 kg/m    Wt Readings from Last 3 Encounters:  07/21/23 (!) 312 lb (141.5 kg)  04/27/23 (!) 304 lb 6.4 oz (138.1 kg)  03/09/23 (!) 308 lb (139.7 kg)     GEN: Well nourished, well developed in no acute distress NECK: No JVD; No carotid bruits CARDIAC: Regular rate and rhythm, no murmurs, rubs, gallops RESPIRATORY:  Clear to auscultation without rales,  wheezing or rhonchi  ABDOMEN: Soft, non-tender, non-distended EXTREMITIES:  No edema; No deformity   ASSESSMENT AND PLAN:    Chronic systolic dysfunction s/p Medtronic CRT-D  euvolemic today Stable on an appropriate medical regimen Normal ICD function See Pace Art report No changes today  Parox AF EKG today shows NSR with stable intervals. QTc ~ 440 ms Continue sotalol 80 mg BID.  Continue Xarelto for CHA2DS2VASc of at least 3 Burden 0%  H/o VT Continue Sotalol as above Labs today  HTN Stable on current regimen   Disposition:   Follow up with Dr. Elberta Fortis in 6-7 months to discuss gen change   Signed, Graciella Freer, [name]

## 2023-07-22 LAB — MAGNESIUM: Magnesium: 2.8 mg/dL — ABNORMAL HIGH (ref 1.6–2.3)

## 2023-07-22 LAB — BASIC METABOLIC PANEL
BUN/Creatinine Ratio: 27 — ABNORMAL HIGH (ref 10–24)
BUN: 54 mg/dL — ABNORMAL HIGH (ref 8–27)
CO2: 23 mmol/L (ref 20–29)
Calcium: 9.4 mg/dL (ref 8.6–10.2)
Chloride: 102 mmol/L (ref 96–106)
Creatinine, Ser: 2 mg/dL — ABNORMAL HIGH (ref 0.76–1.27)
Glucose: 180 mg/dL — ABNORMAL HIGH (ref 70–99)
Potassium: 4.4 mmol/L (ref 3.5–5.2)
Sodium: 141 mmol/L (ref 134–144)
eGFR: 37 mL/min/{1.73_m2} — ABNORMAL LOW (ref >59)

## 2023-07-24 NOTE — Progress Notes (Signed)
EPIC Encounter for ICM Monitoring  Patient Name: Gary Hutchinson is a 62 y.o. male Date: 07/24/2023 Primary Care Physican: Creola Corn, MD Primary Cardiologist: [name] Electrophysiologist: Camnitz Bi-V Pacing: 98.4%    02/03/2023 Weight: 307-308 lbs 04/01/2023 Weight: 308 lbs 06/19/2023 Weight: 305 lbs  Time in AT/AF  0.0 hr/day (0.0%)                     Spoke with patient and heart failure questions reviewed.  Transmission results reviewed.  Pt asymptomatic for fluid accumulation.  Reports feeling well at this time and voices no complaints.  He takes Metolazone periodically for fluid accumulaiton.     Diet: Not limiting salt or fluids at this time   Optivol thoracic impedance suggesting pattern several days with possible fluid accumulation that alternates with returning to baseline.   Difficulty maintaining stable fluid levels.      Prescribed dosage:  Torsemide 20 mg Take 2 tablets (40 mg total) by mouth every morning and 1 tablet (20 total) at bedtime.  He reports Dr Gala Romney has approved that he can take extra Torsemide or Metolazone when needed.  Metolazone 2.5 mg 1 tablet as needed.   Spironolactone 12.5 mg take 1 tablet daily   Labs:   04/27/2023 Creatinine 1.80, BUN 39, Potassium 3.9, Sodium 137, GFR 42 03/09/2023 Creatinine 2.02, BUN 89, Potassium 3.4, Sodium 138  01/19/2023 Creatinine 1.88, BUN 53, Potassium 4.1, Sodium 141, GFR 40 10/27/2022 Creatinine 1.82, BUN 54, Potassium 4.1, Sodium 134, GFR 42 10/17/2022 Creatinine 1.90, BUN 47, Potassium 4.3, Sodium 141 A complete set of results can be found in Results Review.   Recommendations:  No changes and encouraged to call if experiencing any fluid symptoms.    Follow-up plan: ICM clinic phone appointment on 08/24/2023.   91 day device clinic remote transmission 08/05/2023.     EP/Cardiology Office Visits:  Recall 10/24/2023 with Dr Gala Romney.  Recall 07/18/2023 with Dr Elberta Fortis.     Copy of ICM check sent to Dr.  Elberta Fortis.    3 month ICM trend: 07/21/2023.    12-14 Month ICM trend:     Karie Soda, RN 07/24/2023 1:20 PM

## 2023-07-28 ENCOUNTER — Other Ambulatory Visit (HOSPITAL_COMMUNITY): Payer: Self-pay | Admitting: Internal Medicine

## 2023-08-03 ENCOUNTER — Encounter: Payer: Self-pay | Admitting: Adult Health

## 2023-08-03 ENCOUNTER — Ambulatory Visit (INDEPENDENT_AMBULATORY_CARE_PROVIDER_SITE_OTHER): Payer: 59 | Admitting: Adult Health

## 2023-08-03 VITALS — BP 110/60 | HR 91 | Ht 65.5 in | Wt 311.0 lb

## 2023-08-03 DIAGNOSIS — G4733 Obstructive sleep apnea (adult) (pediatric): Secondary | ICD-10-CM

## 2023-08-03 NOTE — Progress Notes (Signed)
@Patient  ID: Gary Hutchinson, male    DOB: 05/20/1961, 62 y.o.   MRN: [id number]  Chief Complaint  Patient presents with   Follow-up    Referring provider: Creola Corn, MD  HPI: 62 year old male followed for very severe sleep apnea on nocturnal BiPAP Medical history significant for atrial fibrillation on Xarelto, V. tach status post AICD, chronic systolic heart failure, pulmonary hypertension, coronary artery disease, diabetes  TEST/EVENTS :  2002 NPSG>AHI 117   08/03/2023 Follow up : OSA  Patient presents for a 1 year follow-up.  Patient has underlying very severe sleep apnea on nocturnal BiPAP.  Patient says he wears his BiPAP every single night.  Cannot sleep without it.  Feels that he benefits from BiPAP with decreased daytime sleepiness.  BiPAP download shows excellent control with 100% usage.  Daily average usage at 5.5 hours.  Patient is on auto BiPAP IPAP max 17 cm H2O.  EPAP minimum 13 cm H2O.  AHI 1.6/hour.  He is currently using a fullface mask.  I doubt that is his DME company.     No Known Allergies  Immunization History  Administered Date(s) Administered   Influenza Inj Mdck Quad Pf 06/18/2018   Influenza Split 07/06/2012   Influenza Whole 05/06/2010   Influenza,inj,Quad PF,6+ Mos 07/06/2013, 07/18/2015, 06/04/2016, 06/11/2017, 06/22/2019, 07/04/2023   Influenza-Unspecified 06/09/2018   Moderna SARS-COV2 Booster Vaccination 07/04/2023   Moderna Sars-Covid-2 Vaccination 01/06/2020, 01/27/2020   Tdap 06/04/2016    Past Medical History:  Diagnosis Date   AICD (automatic cardioverter/defibrillator) present    Medtronic   Alcohol abuse    now quit   Anemia    iron defi   Arthritis    "fingers, knees, some days all my joints ache" (11/16/2015)   Asthma    Cholecystitis    Chronic systolic congestive heart failure (HCC)    a. RHC (02/2011) RA 31/27, RV 71/27, PA67/45, PCWP 46, PA 49%, Fick CO: 3.4 b. ECHO (03/2014) EF 30-35%, grade II DD, mild MR, RV poorly  visualized appears mildly decreased c. RHC (05/2014) RA 13, RV 54/4/11, PA 60/22 (37), PCWP 17, Fick CO/CI: 4.4 / 1.9, Thermo CO/CI: 3.8 /1.6, PVR 5.2 WU, PA 57% and 59%   Diverticulosis of colon    Gout    Hemorrhoids, internal    Hyperlipidemia    Hypertension    Morbid obesity (HCC)    Nonischemic cardiomyopathy (HCC)    a. LHC (02/2011) normal coronaries   OSA on CPAP    Paroxysmal atrial fibrillation (HCC)    Pneumonia 2000s X 1   Pulmonary hypertension (HCC)    Renal insufficiency    Type II diabetes mellitus (HCC)    Ventricular tachycardia (HCC)    s/p MDT ICD implant    Tobacco History: Social History   Tobacco Use  Smoking Status Never  Smokeless Tobacco Never   Counseling given: Not Answered   Outpatient Medications Prior to Visit  Medication Sig Dispense Refill   acetaminophen (TYLENOL) 500 MG tablet Take 500 mg by mouth every 6 (six) hours as needed for moderate pain or headache.      albuterol (VENTOLIN HFA) 108 (90 Base) MCG/ACT inhaler Inhale 2 puffs into the lungs every 6 (six) hours as needed for wheezing or shortness of breath.     allopurinol (ZYLOPRIM) 100 MG tablet Take 100 mg by mouth daily.     atorvastatin (LIPITOR) 80 MG tablet Take 80 mg by mouth at bedtime.     colchicine 0.6  MG tablet Take 0.6 mg by mouth daily as needed (for gout flares).      dapagliflozin propanediol (FARXIGA) 10 MG TABS tablet Take 10 mg by mouth daily.     digoxin (LANOXIN) 0.125 MG tablet Take 0.5 tablets by mouth every other day.     ENTRESTO 97-103 MG TAKE 1 TABLET BY MOUTH TWICE A DAY 180 tablet 3   Fluticasone-Salmeterol (ADVAIR) 250-50 MCG/DOSE AEPB Inhale 1 puff into the lungs every 12 (twelve) hours as needed (for flares/shortness of breath). Reported on 02/06/2016     hydrALAZINE (APRESOLINE) 25 MG tablet TAKE 1.5 TABLETS (37.5 MG TOTAL) BY MOUTH 3 (THREE) TIMES DAILY. 405 tablet 3   insulin lispro (HUMALOG) 100 UNIT/ML injection Inject 10 Units into the skin See admin  instructions. 4 to 5 times daily with a meal, depending on how many meals are eaten that day.     isosorbide mononitrate (IMDUR) 30 MG 24 hr tablet TAKE 1 TABLET (30 MG TOTAL) BY MOUTH IN THE MORNING AND AT BEDTIME. 180 tablet 3   metolazone (ZAROXOLYN) 2.5 MG tablet Take 2.5 mg by mouth daily as needed (for edema).     montelukast (SINGULAIR) 10 MG tablet Take 10 mg by mouth as needed (allergies).     ondansetron (ZOFRAN-ODT) 8 MG disintegrating tablet Take 1 tablet (8 mg total) by mouth every 8 (eight) hours as needed for nausea or vomiting. 20 tablet 0   OZEMPIC, 1 MG/DOSE, 4 MG/3ML SOPN Inject 1 mg into the skin once a week.     potassium chloride SA (KLOR-CON M) 20 MEQ tablet Take 2 tablets (40 mEq total) by mouth as directed. Please take 40 meq when you take your Metolazone 60 tablet 3   sotalol (BETAPACE) 80 MG tablet TAKE 1 TABLET BY MOUTH 2 TIMES DAILY. 180 tablet 2   spironolactone (ALDACTONE) 25 MG tablet Take 12.5 mg by mouth daily.      torsemide (DEMADEX) 20 MG tablet TAKE 2 TABLETS (40 MG TOTAL) BY MOUTH EVERY MORNING AND 1 TABLET (20 MG TOTAL) AT BEDTIME. 270 tablet 3   TRESIBA FLEXTOUCH 200 UNIT/ML FlexTouch Pen Inject 48 Units into the skin at bedtime.     XARELTO 20 MG TABS tablet TAKE 1 TABLET BY MOUTH DAILY WITH SUPPER 90 tablet 3   No facility-administered medications prior to visit.     Review of Systems:   Constitutional:   No  weight loss, night sweats,  Fevers, chills, fatigue, or  lassitude.  HEENT:   No headaches,  Difficulty swallowing,  Tooth/dental problems, or  Sore throat,                No sneezing, itching, ear ache, nasal congestion, post nasal drip,   CV:  No chest pain,  Orthopnea, PND, swelling in lower extremities, anasarca, dizziness, palpitations, syncope.   GI  No heartburn, indigestion, abdominal pain, nausea, vomiting, diarrhea, change in bowel habits, loss of appetite, bloody stools.   Resp: .  No chest wall deformity  Skin: no rash or  lesions.  GU: no dysuria, change in color of urine, no urgency or frequency.  No flank pain, no hematuria   MS:  No joint pain or swelling.  No decreased range of motion.  No back pain.    Physical Exam  BP 110/60 (BP Location: Left Arm, Patient Position: Sitting, Cuff Size: Large)   Pulse 91   Ht 5' 5.5" (1.664 m)   Wt (!) 311 lb (141.1 kg)  SpO2 96%   BMI 50.97 kg/m   GEN: A/Ox3; pleasant , NAD, well nourished    HEENT:  [place]/AT,, NOSE-clear, THROAT-clear, no lesions, no postnasal drip or exudate noted. Class 3 MP airway   NECK:  Supple w/ fair ROM; no JVD; normal carotid impulses w/o bruits; no thyromegaly or nodules palpated; no lymphadenopathy.    RESP  Clear  P & A; w/o, wheezes/ rales/ or rhonchi. no accessory muscle use, no dullness to percussion  CARD:  RRR, no m/r/g, no peripheral edema, pulses intact, no cyanosis or clubbing.  GI:   Soft & nt; nml bowel sounds; no organomegaly or masses detected.   Musco: Warm bil, no deformities or joint swelling noted.   Neuro: alert, no focal deficits noted.    Skin: Warm, no lesions or rashes    Lab Results:    BMET   Administration History     None           No data to display          No results found for: "NITRICOXIDE"      Assessment & Plan:   Obstructive sleep apnea Excellent control and compliance on nocturnal CPAP.  No changes in settings.  CPAP care discussed in detail  Plan  Patient Instructions  Continue on BIPAP At bedtime   Keep up the good work Need to wear for at least 6 hrs each night  Do not drive if sleepy.  Work on weight loss.  Follow up Dr. Maple Hudson  In 1 year and As needed      Morbid obesity (HCC) Healthy weight loss discussed     Rubye Oaks, NP 08/03/2023

## 2023-08-03 NOTE — Addendum Note (Signed)
Addended by: Delrae Rend on: 08/03/2023 10:51 AM   Modules accepted: Orders

## 2023-08-03 NOTE — Assessment & Plan Note (Signed)
Excellent control and compliance on nocturnal CPAP.  No changes in settings.  CPAP care discussed in detail  Plan  Patient Instructions  Continue on BIPAP At bedtime   Keep up the good work Need to wear for at least 6 hrs each night  Do not drive if sleepy.  Work on weight loss.  Follow up Dr. Maple Hudson  In 1 year and As needed

## 2023-08-03 NOTE — Patient Instructions (Addendum)
Continue on BIPAP At bedtime   Keep up the good work Need to wear for at least 6 hrs each night  Do not drive if sleepy.  Work on weight loss.  Follow up Dr. Maple Hudson  In 1 year and As needed

## 2023-08-03 NOTE — Assessment & Plan Note (Signed)
Healthy weight loss discussed 

## 2023-08-04 ENCOUNTER — Other Ambulatory Visit (HOSPITAL_COMMUNITY): Payer: Self-pay

## 2023-08-04 DIAGNOSIS — I5022 Chronic systolic (congestive) heart failure: Secondary | ICD-10-CM

## 2023-08-04 MED ORDER — POTASSIUM CHLORIDE CRYS ER 20 MEQ PO TBCR: 40.0000 meq | EXTENDED_RELEASE_TABLET | ORAL | 6 refills | Status: AC

## 2023-08-05 ENCOUNTER — Ambulatory Visit (INDEPENDENT_AMBULATORY_CARE_PROVIDER_SITE_OTHER): Payer: 59

## 2023-08-05 DIAGNOSIS — I428 Other cardiomyopathies: Secondary | ICD-10-CM

## 2023-08-05 LAB — CUP PACEART REMOTE DEVICE CHECK
Battery Remaining Longevity: 8 mo
Battery Voltage: 2.84 V
Brady Statistic AP VP Percent: 0.06 %
Brady Statistic AP VS Percent: 0.01 %
Brady Statistic AS VP Percent: 98.54 %
Brady Statistic AS VS Percent: 1.38 %
Brady Statistic RA Percent Paced: 0.07 %
Brady Statistic RV Percent Paced: 7.64 %
Date Time Interrogation Session: 20241030052827
HighPow Impedance: 103 Ohm
Implantable Lead Connection Status: 753985
Implantable Lead Connection Status: 753985
Implantable Lead Connection Status: 753985
Implantable Lead Implant Date: 20090901
Implantable Lead Implant Date: 20170210
Implantable Lead Implant Date: 20170210
Implantable Lead Location: 753858
Implantable Lead Location: 753859
Implantable Lead Location: 753860
Implantable Lead Model: 4598
Implantable Lead Model: 5076
Implantable Lead Model: 6935
Implantable Pulse Generator Implant Date: 20170210
Lead Channel Impedance Value: 1121 Ohm
Lead Channel Impedance Value: 1140 Ohm
Lead Channel Impedance Value: 1140 Ohm
Lead Channel Impedance Value: 361 Ohm
Lead Channel Impedance Value: 399 Ohm
Lead Channel Impedance Value: 418 Ohm
Lead Channel Impedance Value: 475 Ohm
Lead Channel Impedance Value: 475 Ohm
Lead Channel Impedance Value: 532 Ohm
Lead Channel Impedance Value: 608 Ohm
Lead Channel Impedance Value: 760 Ohm
Lead Channel Impedance Value: 817 Ohm
Lead Channel Impedance Value: 893 Ohm
Lead Channel Pacing Threshold Amplitude: 0.625 V
Lead Channel Pacing Threshold Amplitude: 0.875 V
Lead Channel Pacing Threshold Amplitude: 1.875 V
Lead Channel Pacing Threshold Pulse Width: 0.4 ms
Lead Channel Pacing Threshold Pulse Width: 0.4 ms
Lead Channel Pacing Threshold Pulse Width: 0.4 ms
Lead Channel Sensing Intrinsic Amplitude: 14.875 mV
Lead Channel Sensing Intrinsic Amplitude: 14.875 mV
Lead Channel Sensing Intrinsic Amplitude: 2.375 mV
Lead Channel Sensing Intrinsic Amplitude: 2.375 mV
Lead Channel Setting Pacing Amplitude: 1.5 V
Lead Channel Setting Pacing Amplitude: 2 V
Lead Channel Setting Pacing Amplitude: 3 V
Lead Channel Setting Pacing Pulse Width: 0.4 ms
Lead Channel Setting Pacing Pulse Width: 0.4 ms
Lead Channel Setting Sensing Sensitivity: 0.3 mV
Zone Setting Status: 755011
Zone Setting Status: 755011

## 2023-08-18 ENCOUNTER — Other Ambulatory Visit (HOSPITAL_COMMUNITY): Payer: Self-pay | Admitting: Internal Medicine

## 2023-08-24 NOTE — Progress Notes (Signed)
Remote ICD transmission.   

## 2023-08-26 NOTE — Progress Notes (Signed)
No ICM remote transmission received for 08/24/2023 and next ICM transmission scheduled for 09/14/2023.

## 2023-09-14 ENCOUNTER — Ambulatory Visit: Payer: 59 | Attending: Cardiology

## 2023-09-14 DIAGNOSIS — Z95810 Presence of automatic (implantable) cardiac defibrillator: Secondary | ICD-10-CM | POA: Diagnosis not present

## 2023-09-14 DIAGNOSIS — I5022 Chronic systolic (congestive) heart failure: Secondary | ICD-10-CM

## 2023-09-16 NOTE — Progress Notes (Signed)
EPIC Encounter for ICM Monitoring  Patient Name: Gary Hutchinson is a 62 y.o. male Date: 09/16/2023 Primary Care Physican: Creola Corn, MD Primary Cardiologist: [name] Electrophysiologist: Camnitz Bi-V Pacing: 98.4%    02/03/2023 Weight: 307-308 lbs 04/01/2023 Weight: 308 lbs 06/19/2023 Weight: 305 lbs 09/16/2023 Weight: 311 lbs   Time in AT/AF  0.0 hr/day (0.0%) Battery Longevity:  6 months                     Spoke with patient and heart failure questions reviewed.  Transmission results reviewed.  Pt asymptomatic for fluid accumulation.  Reports feeling well at this time and voices no complaints.  He takes Metolazone periodically for fluid accumulation.     Diet: Not limiting salt or fluids at this time   Optivol thoracic impedance suggesting intermittent days with possible fluid accumulation within the last month.   Difficulty maintaining stable fluid levels.      Prescribed dosage:  Torsemide 20 mg Take 2 tablets (40 mg total) by mouth every morning and 1 tablet (20 total) at bedtime.  He reports Dr Gala Romney has approved that he can take extra Torsemide or Metolazone when needed.  Metolazone 2.5 mg 1 tablet as needed.   Spironolactone 12.5 mg take 1 tablet daily   Labs:   04/27/2023 Creatinine 1.80, BUN 39, Potassium 3.9, Sodium 137, GFR 42 03/09/2023 Creatinine 2.02, BUN 89, Potassium 3.4, Sodium 138  01/19/2023 Creatinine 1.88, BUN 53, Potassium 4.1, Sodium 141, GFR 40 10/27/2022 Creatinine 1.82, BUN 54, Potassium 4.1, Sodium 134, GFR 42 10/17/2022 Creatinine 1.90, BUN 47, Potassium 4.3, Sodium 141 A complete set of results can be found in Results Review.   Recommendations:  No changes and encouraged to call if experiencing any fluid symptoms.    Follow-up plan: ICM clinic phone appointment on 10/19/2023.   91 day device clinic remote transmission 11/04/2023.     EP/Cardiology Office Visits:  Recall 10/24/2023 with Dr Gala Romney.  Recall 02/04/2024 with Dr Elberta Fortis.     Copy  of ICM check sent to Dr. Elberta Fortis.    3 month ICM trend: 09/14/2023.    12-14 Month ICM trend:     Karie Soda, RN 09/16/2023 12:09 PM

## 2023-10-19 ENCOUNTER — Ambulatory Visit: Payer: 59 | Attending: Cardiology

## 2023-10-19 DIAGNOSIS — I5022 Chronic systolic (congestive) heart failure: Secondary | ICD-10-CM | POA: Diagnosis not present

## 2023-10-19 DIAGNOSIS — Z95810 Presence of automatic (implantable) cardiac defibrillator: Secondary | ICD-10-CM

## 2023-10-23 NOTE — Progress Notes (Signed)
EPIC Encounter for ICM Monitoring  Patient Name: Gary Hutchinson is a 63 y.o. male Date: 10/23/2023 Primary Care Physican: Creola Corn, MD Primary Cardiologist: [name] Electrophysiologist: Elberta Fortis Bi-V Pacing: 98.0%    02/03/2023 Weight: 307-308 lbs 04/01/2023 Weight: 308 lbs 06/19/2023 Weight: 305 lbs 09/16/2023 Weight: 311 lbs 10/23/2023 Weight: 313 lbs   Time in AT/AF  <0.1 hr/day (<0.1%) Battery Longevity:  1 month                     Spoke with patient and heart failure questions reviewed.  Transmission results reviewed.  Pt asymptomatic for fluid accumulation.  Reports feeling well at this time and voices no complaints.       Diet: Not limiting salt or fluids at this time   Optivol thoracic impedance suggesting intermittent days with possible fluid accumulation within the last month.   Difficulty maintaining stable fluid levels.      Prescribed dosage:  Torsemide 20 mg Take 2 tablets (40 mg total) by mouth every morning and 1 tablet (20 total) at bedtime.  He reports Dr Gala Romney has approved that he can take extra Torsemide or Metolazone when needed.  Metolazone 2.5 mg 1 tablet as needed.   Spironolactone 12.5 mg take 1 tablet daily   Labs:   07/21/2023 Creatinine 2.00, BUN 54, Potassium 4.4, Sodium 141, GFR 37 04/27/2023 Creatinine 1.80, BUN 39, Potassium 3.9, Sodium 137, GFR 42 03/09/2023 Creatinine 2.02, BUN 89, Potassium 3.4, Sodium 138  01/19/2023 Creatinine 1.88, BUN 53, Potassium 4.1, Sodium 141, GFR 40 10/27/2022 Creatinine 1.82, BUN 54, Potassium 4.1, Sodium 134, GFR 42 10/17/2022 Creatinine 1.90, BUN 47, Potassium 4.3, Sodium 141 A complete set of results can be found in Results Review.   Recommendations:  No changes and encouraged to call if experiencing any fluid symptoms.   Follow-up plan: ICM clinic phone appointment on 11/23/2023.   91 day device clinic remote transmission 11/04/2023.     EP/Cardiology Office Visits:  11/12/2023 with Dr Gala Romney.  Recall  02/04/2024 with Dr Elberta Fortis.     Copy of ICM check sent to Dr. Elberta Fortis.     3 month ICM trend: 10/19/2023.    12-14 Month ICM trend:     Karie Soda, RN 10/23/2023 9:54 AM

## 2023-11-04 ENCOUNTER — Ambulatory Visit (INDEPENDENT_AMBULATORY_CARE_PROVIDER_SITE_OTHER): Payer: 59

## 2023-11-04 DIAGNOSIS — I447 Left bundle-branch block, unspecified: Secondary | ICD-10-CM

## 2023-11-05 LAB — CUP PACEART REMOTE DEVICE CHECK
Battery Remaining Longevity: 4 mo
Battery Voltage: 2.82 V
Brady Statistic AP VP Percent: 0.84 %
Brady Statistic AP VS Percent: 0.03 %
Brady Statistic AS VP Percent: 97.55 %
Brady Statistic AS VS Percent: 1.58 %
Brady Statistic RA Percent Paced: 0.86 %
Brady Statistic RV Percent Paced: 8.6 %
Date Time Interrogation Session: 20250129222206
HighPow Impedance: 96 Ohm
Implantable Lead Connection Status: 753985
Implantable Lead Connection Status: 753985
Implantable Lead Connection Status: 753985
Implantable Lead Implant Date: 20090901
Implantable Lead Implant Date: 20170210
Implantable Lead Implant Date: 20170210
Implantable Lead Location: 753858
Implantable Lead Location: 753859
Implantable Lead Location: 753860
Implantable Lead Model: 4598
Implantable Lead Model: 5076
Implantable Lead Model: 6935
Implantable Pulse Generator Implant Date: 20170210
Lead Channel Impedance Value: 1064 Ohm
Lead Channel Impedance Value: 1121 Ohm
Lead Channel Impedance Value: 1140 Ohm
Lead Channel Impedance Value: 361 Ohm
Lead Channel Impedance Value: 399 Ohm
Lead Channel Impedance Value: 456 Ohm
Lead Channel Impedance Value: 456 Ohm
Lead Channel Impedance Value: 513 Ohm
Lead Channel Impedance Value: 513 Ohm
Lead Channel Impedance Value: 589 Ohm
Lead Channel Impedance Value: 722 Ohm
Lead Channel Impedance Value: 836 Ohm
Lead Channel Impedance Value: 893 Ohm
Lead Channel Pacing Threshold Amplitude: 0.625 V
Lead Channel Pacing Threshold Amplitude: 0.875 V
Lead Channel Pacing Threshold Amplitude: 1.625 V
Lead Channel Pacing Threshold Pulse Width: 0.4 ms
Lead Channel Pacing Threshold Pulse Width: 0.4 ms
Lead Channel Pacing Threshold Pulse Width: 0.4 ms
Lead Channel Sensing Intrinsic Amplitude: 1.875 mV
Lead Channel Sensing Intrinsic Amplitude: 1.875 mV
Lead Channel Sensing Intrinsic Amplitude: 10.75 mV
Lead Channel Sensing Intrinsic Amplitude: 10.75 mV
Lead Channel Setting Pacing Amplitude: 1.5 V
Lead Channel Setting Pacing Amplitude: 2 V
Lead Channel Setting Pacing Amplitude: 2.75 V
Lead Channel Setting Pacing Pulse Width: 0.4 ms
Lead Channel Setting Pacing Pulse Width: 0.4 ms
Lead Channel Setting Sensing Sensitivity: 0.3 mV
Zone Setting Status: 755011
Zone Setting Status: 755011

## 2023-11-12 ENCOUNTER — Ambulatory Visit (HOSPITAL_COMMUNITY)
Admission: RE | Admit: 2023-11-12 | Discharge: 2023-11-12 | Disposition: A | Discharge status: Home / Self Care | Payer: 59 | Source: Ambulatory Visit | Attending: Internal Medicine | Admitting: Internal Medicine

## 2023-11-12 ENCOUNTER — Ambulatory Visit (HOSPITAL_BASED_OUTPATIENT_CLINIC_OR_DEPARTMENT_OTHER)
Admission: RE | Admit: 2023-11-12 | Discharge: 2023-11-12 | Disposition: A | Discharge status: Home / Self Care | Payer: 59 | Source: Ambulatory Visit | Attending: Internal Medicine | Admitting: Internal Medicine

## 2023-11-12 ENCOUNTER — Encounter (HOSPITAL_COMMUNITY): Payer: Self-pay | Admitting: Internal Medicine

## 2023-11-12 VITALS — BP 104/60 | HR 76 | Wt 312.8 lb

## 2023-11-12 DIAGNOSIS — I5022 Chronic systolic (congestive) heart failure: Secondary | ICD-10-CM

## 2023-11-12 DIAGNOSIS — Z79899 Other long term (current) drug therapy: Secondary | ICD-10-CM | POA: Diagnosis not present

## 2023-11-12 DIAGNOSIS — I480 Paroxysmal atrial fibrillation: Secondary | ICD-10-CM | POA: Diagnosis not present

## 2023-11-12 DIAGNOSIS — N1832 Chronic kidney disease, stage 3b: Secondary | ICD-10-CM | POA: Diagnosis not present

## 2023-11-12 DIAGNOSIS — I10 Essential (primary) hypertension: Secondary | ICD-10-CM

## 2023-11-12 DIAGNOSIS — N183 Chronic kidney disease, stage 3 unspecified: Secondary | ICD-10-CM | POA: Insufficient documentation

## 2023-11-12 DIAGNOSIS — R9431 Abnormal electrocardiogram [ECG] [EKG]: Secondary | ICD-10-CM | POA: Insufficient documentation

## 2023-11-12 DIAGNOSIS — Z95810 Presence of automatic (implantable) cardiac defibrillator: Secondary | ICD-10-CM | POA: Insufficient documentation

## 2023-11-12 DIAGNOSIS — Z6841 Body Mass Index (BMI) 40.0 and over, adult: Secondary | ICD-10-CM | POA: Diagnosis not present

## 2023-11-12 DIAGNOSIS — Z794 Long term (current) use of insulin: Secondary | ICD-10-CM | POA: Diagnosis not present

## 2023-11-12 DIAGNOSIS — E1122 Type 2 diabetes mellitus with diabetic chronic kidney disease: Secondary | ICD-10-CM | POA: Insufficient documentation

## 2023-11-12 DIAGNOSIS — Z7901 Long term (current) use of anticoagulants: Secondary | ICD-10-CM | POA: Insufficient documentation

## 2023-11-12 DIAGNOSIS — I428 Other cardiomyopathies: Secondary | ICD-10-CM | POA: Diagnosis present

## 2023-11-12 DIAGNOSIS — I110 Hypertensive heart disease with heart failure: Secondary | ICD-10-CM | POA: Diagnosis present

## 2023-11-12 DIAGNOSIS — G4733 Obstructive sleep apnea (adult) (pediatric): Secondary | ICD-10-CM | POA: Diagnosis not present

## 2023-11-12 DIAGNOSIS — I4892 Unspecified atrial flutter: Secondary | ICD-10-CM | POA: Insufficient documentation

## 2023-11-12 DIAGNOSIS — I130 Hypertensive heart and chronic kidney disease with heart failure and stage 1 through stage 4 chronic kidney disease, or unspecified chronic kidney disease: Secondary | ICD-10-CM | POA: Diagnosis not present

## 2023-11-12 LAB — COMPREHENSIVE METABOLIC PANEL
ALT: 16 U/L (ref 0–44)
AST: 32 U/L (ref 15–41)
Albumin: 3.6 g/dL (ref 3.5–5.0)
Alkaline Phosphatase: 91 U/L (ref 38–126)
Anion gap: 13 (ref 5–15)
BUN: 69 mg/dL — ABNORMAL HIGH (ref 8–23)
CO2: 25 mmol/L (ref 22–32)
Calcium: 9.4 mg/dL (ref 8.9–10.3)
Chloride: 98 mmol/L (ref 98–111)
Creatinine, Ser: 2.2 mg/dL — ABNORMAL HIGH (ref 0.61–1.24)
GFR, Estimated: 33 mL/min — ABNORMAL LOW (ref >60)
Glucose, Bld: 137 mg/dL — ABNORMAL HIGH (ref 70–99)
Potassium: 4.5 mmol/L (ref 3.5–5.1)
Sodium: 136 mmol/L (ref 135–145)
Total Bilirubin: 1.2 mg/dL (ref 0.0–1.2)
Total Protein: 7.4 g/dL (ref 6.5–8.1)

## 2023-11-12 LAB — ECHOCARDIOGRAM COMPLETE
AR max vel: 3.64 cm2
AV Area VTI: 3.77 cm2
AV Area mean vel: 3.17 cm2
AV Mean grad: 2 mm[Hg]
AV Peak grad: 2.6 mm[Hg]
Ao pk vel: 0.81 m/s
Area-P 1/2: 4.54 cm2
S' Lateral: 4.8 cm
Single Plane A4C EF: 46.9 %

## 2023-11-12 LAB — CBC
HCT: 40.3 % (ref 39.0–52.0)
Hemoglobin: 12.5 g/dL — ABNORMAL LOW (ref 13.0–17.0)
MCH: 25.7 pg — ABNORMAL LOW (ref 26.0–34.0)
MCHC: 31 g/dL (ref 30.0–36.0)
MCV: 82.8 fL (ref 80.0–100.0)
Platelets: 215 10*3/uL (ref 150–400)
RBC: 4.87 MIL/uL (ref 4.22–5.81)
RDW: 15.6 % — ABNORMAL HIGH (ref 11.5–15.5)
WBC: 7.1 10*3/uL (ref 4.0–10.5)
nRBC: 0 % (ref 0.0–0.2)

## 2023-11-12 LAB — MAGNESIUM: Magnesium: 3.6 mg/dL — ABNORMAL HIGH (ref 1.7–2.4)

## 2023-11-12 LAB — BRAIN NATRIURETIC PEPTIDE: B Natriuretic Peptide: 30.1 pg/mL (ref 0.0–100.0)

## 2023-11-12 MED ORDER — PERFLUTREN LIPID MICROSPHERE
1.0000 mL | INTRAVENOUS | Status: DC | PRN
  Administered 2023-11-12: 4 mL via INTRAVENOUS

## 2023-11-12 MED ORDER — HYDRALAZINE HCL 25 MG PO TABS: 25.0000 mg | ORAL_TABLET | Freq: Three times a day (TID) | ORAL | 3 refills | Status: DC

## 2023-11-12 NOTE — Patient Instructions (Addendum)
 Good to see you today!  Medication Changes:  DECREASE Hydralazine  to 25 mg Three times a day   Lab Work:  Labs done today, your results will be available in MyChart, we will contact you for abnormal readings.  Special Instructions // Education:  Do the following things EVERYDAY: Weigh yourself in the morning before breakfast. Write it down and keep it in a log. Take your medicines as prescribed Eat low salt foods--Limit salt (sodium) to 2000 mg per day.  Stay as active as you can everyday Limit all fluids for the day to less than 2 liters   Follow-Up in: 6 months   At the [facility], you and your health needs are our priority. We have a designated team specialized in the treatment of Heart Failure. This Care Team includes your primary Heart Failure Specialized Cardiologist (physician), Advanced Practice Providers (APPs- Physician Assistants and Nurse Practitioners), and Pharmacist who all work together to provide you with the care you need, when you need it.   You may see any of the following providers on your designated Care Team at your next follow up:  Dr. Toribio Fuel Dr. Ezra Shuck Dr. Ria Commander Dr. Odis Brownie Greig Mosses, NP Caffie Shed, [place] [facility] [place], [place] Beckey Coe, NP Jordan Lee, NP Tinnie Redman, PharmD   Please be sure to bring in all your medications bottles to every appointment.   Need to Contact Us :  If you have any questions or concerns before your next appointment please send us  a message through [place] or call our office at [phone].    TO LEAVE A MESSAGE FOR THE NURSE SELECT OPTION 2, PLEASE LEAVE A MESSAGE INCLUDING: YOUR NAME DATE OF BIRTH CALL BACK NUMBER REASON FOR CALL**this is important as we prioritize the call backs  YOU WILL RECEIVE A CALL BACK THE SAME DAY AS LONG AS YOU CALL BEFORE 4:00 PM

## 2023-11-12 NOTE — Progress Notes (Signed)
 Patient ID: Gary Hutchinson, male   DOB: Sep 06, 1961, 63 y.o.   MRN: [id number]    [facility] Note   PCP: Norleen Jungling EP: Lynwood Rakers HF Cardiology: Dr. Cherrie  HPI: Gary Hutchinson is a 63 y.o. male with a history of chronic systolic heart failure due to NICM s/p ICD, HTN, DM II, OSA on CPAP, history of ventricular tachycardia status post AICD placement in 2009, atrial fibrillation and morbid obesity.    LHC 5/12 No CAD  Admitted 6/15 for A/C HF and cardiogenic shock (co-ox 42%). Diuresed with IV lasix  and milrinone  which were weaned off.  He went into Afib with RVR and chemically cardioverted back to NSR with IV amiodarone .   RHC 8/15: Left sided filling pressures well compensated, mild pulm HTN with elevated right-sided pressures and mod/severely depressed CO. Discussion about trial of milrinone  but patient wanted to hold off.  Has h/o AF/AFL s/p multiple DCCVs last oon 07/29/2017.  Echo 6/23 EF 32% RV mildly reduced Moderate MR  Follow up 1/24, had COVID 12/23 and has in AF for 2 weeks resulting in loss of BIV pacing and volume overload. Back in NSR.   Echo today 11/12/23: EF 30-35% RV mild HK Personally reviewed   Returns for routine f/u. Feels good. Denies CP or undue SOB. Says he is taking one day at a time. Tries to do some walking every day. Fluid up and down. Follows with Gary Hutchinson in [facility]. Takes metolazone  on occasion  ICD interrogation: Fluid up and down. Now down. No VT/AF. Activity level ~1.5 hr/day 100% BIV pacing Personally reviewed    Cardiac Studies:   - Echo (7/13): EF 25-30%.  Grade 1 diastolic dysfunction.  Mild MR.  Mod dilated LA.    - Echo (1/17): EF ~40%. RV mildly dilated.   - Echo (8/17): EF 25-35%  - Echo (10/18): EF  25- 30%  - Echo (3/21):EF ~30% RV ok.   - Echo (6/23): EF 32%, RV mildly reduced, moderate MR  SH: Lives with son in [place]. Disabled. No ETOH or tobacco abuse  FH; Mother living: HT         Father deceased: was not part of his life so not sure health issues  Review of systems complete and found to be negative unless listed in HPI.   Past Medical History:  Diagnosis Date   AICD (automatic cardioverter/defibrillator) present    Medtronic   Alcohol abuse    now quit   Anemia    iron defi   Arthritis    fingers, knees, some days all my joints ache (11/16/2015)   Asthma    Cholecystitis    Chronic systolic congestive heart failure (HCC)    a. RHC (02/2011) RA 31/27, RV 71/27, PA67/45, PCWP 46, PA 49%, Fick CO: 3.4 b. ECHO (03/2014) EF 30-35%, grade II DD, mild MR, RV poorly visualized appears mildly decreased c. RHC (05/2014) RA 13, RV 54/4/11, PA 60/22 (37), PCWP 17, Fick CO/CI: 4.4 / 1.9, Thermo CO/CI: 3.8 /1.6, PVR 5.2 WU, PA 57% and 59%   Diverticulosis of colon    Gout    Hemorrhoids, internal    Hyperlipidemia    Hypertension    Morbid obesity (HCC)    Nonischemic cardiomyopathy (HCC)    a. LHC (02/2011) normal coronaries   OSA on CPAP    Paroxysmal atrial fibrillation (HCC)    Pneumonia 2000s X 1   Pulmonary hypertension (HCC)    Renal insufficiency  Type II diabetes mellitus (HCC)    Ventricular tachycardia (HCC)    s/p MDT ICD implant   Current Outpatient Medications  Medication Sig Dispense Refill   acetaminophen  (TYLENOL ) 500 MG tablet Take 500 mg by mouth every 6 (six) hours as needed for moderate pain or headache.      albuterol  (VENTOLIN  HFA) 108 (90 Base) MCG/ACT inhaler Inhale 2 puffs into the lungs every 6 (six) hours as needed for wheezing or shortness of breath.     allopurinol  (ZYLOPRIM ) 100 MG tablet Take 100 mg by mouth daily.     atorvastatin  (LIPITOR) 80 MG tablet Take 80 mg by mouth at bedtime.     colchicine  0.6 MG tablet Take 0.6 mg by mouth daily as needed (for gout flares).      dapagliflozin propanediol (FARXIGA) 10 MG TABS tablet Take 10 mg by mouth daily.     digoxin  (LANOXIN ) 0.125 MG tablet Take 0.5 tablets by mouth every other day.      ENTRESTO  97-103 MG TAKE 1 TABLET BY MOUTH TWICE A DAY 180 tablet 3   Fluticasone -Salmeterol (ADVAIR) 250-50 MCG/DOSE AEPB Inhale 1 puff into the lungs every 12 (twelve) hours as needed (for flares/shortness of breath). Reported on 02/06/2016     hydrALAZINE  (APRESOLINE ) 25 MG tablet TAKE 1.5 TABLETS (37.5 MG TOTAL) BY MOUTH 3 (THREE) TIMES DAILY. 405 tablet 3   insulin  lispro (HUMALOG) 100 UNIT/ML injection Inject 10 Units into the skin See admin instructions. 4 to 5 times daily with a meal, depending on how many meals are eaten that day.     isosorbide  mononitrate (IMDUR ) 30 MG 24 hr tablet TAKE 1 TABLET (30 MG TOTAL) BY MOUTH IN THE MORNING AND AT BEDTIME. 180 tablet 3   metolazone  (ZAROXOLYN ) 2.5 MG tablet Take 2.5 mg by mouth daily as needed (for edema).     montelukast  (SINGULAIR ) 10 MG tablet Take 10 mg by mouth as needed (allergies).     ondansetron  (ZOFRAN -ODT) 8 MG disintegrating tablet Take 1 tablet (8 mg total) by mouth every 8 (eight) hours as needed for nausea or vomiting. 20 tablet 0   OZEMPIC, 1 MG/DOSE, 4 MG/3ML SOPN Inject 1 mg into the skin once a week.     potassium chloride  SA (KLOR-CON  M) 20 MEQ tablet Take 2 tablets (40 mEq total) by mouth as directed. Please take 40 meq when you take your Metolazone  60 tablet 6   sotalol  (BETAPACE ) 80 MG tablet TAKE 1 TABLET BY MOUTH 2 TIMES DAILY. 180 tablet 2   spironolactone  (ALDACTONE ) 25 MG tablet Take 12.5 mg by mouth daily.      torsemide  (DEMADEX ) 20 MG tablet TAKE 2 TABLETS (40 MG TOTAL) BY MOUTH EVERY MORNING AND 1 TABLET (20 MG TOTAL) AT BEDTIME. 270 tablet 3   TRESIBA FLEXTOUCH 200 UNIT/ML FlexTouch Pen Inject 48 Units into the skin at bedtime.     XARELTO  20 MG TABS tablet TAKE 1 TABLET BY MOUTH DAILY WITH SUPPER 90 tablet 3   No current facility-administered medications for this encounter.   Facility-Administered Medications Ordered in Other Encounters  Medication Dose Route Frequency Provider Last Rate Last Admin    perflutren  lipid microspheres (DEFINITY ) IV suspension  1-10 mL Intravenous PRN Lucilia Yanni, Toribio SAUNDERS, MD   4 mL at 11/12/23 1009   BP 104/60   Pulse 76   Wt (!) 141.9 kg (312 lb 12.8 oz)   SpO2 96%   BMI 51.26 kg/m   Wt Readings from Last 3 Encounters:  11/12/23 (!) 141.9 kg (312 lb 12.8 oz)  08/03/23 (!) 141.1 kg (311 lb)  07/21/23 (!) 141.5 kg (312 lb)   Physical Exam:  General:  Obese male Well appearing. No resp difficulty HEENT: normal Neck: supple. no JVD. Carotids 2+ bilat; no bruits. No lymphadenopathy or thryomegaly appreciated. Cor: Regular rate & rhythm. No rubs, gallops or murmurs. Lungs: clear Abdomen: obese soft, nontender, nondistended.[name] Richard bowel sounds. Extremities: no cyanosis, clubbing, rash, edema Neuro: alert & orientedx3, cranial nerves grossly intact. moves all 4 extremities w/o difficulty. Affect pleasant  ECG: As vpaced 78 QTC 469  Personally reviewed  MDT Device Interrogation ICD interrogation: Fluid up and down. Now down. No VT/AF. Activity level ~1.5 hr/day 100% BIV pacing Personally reviewed  ASSESSMENT & PLAN: 1) Chronic systolic HF: due to NICM s/p BiV Medtronic - LHC 5/12 No CAD - Echo 10/18 LVEF 25-30%.  - Echo 3/21 EF 30% - Echo (03/11/22) EF 31% RV mildly down  Moderate MR - s/p CRT-D due for generator change soon. Interrogation as above  - Echo today 11/12/23: EF 30-35% RV mild HK Personally reviewed -stable NYHA II. Volume status looks good on exam and Optivol - Continue torsemide  40 mg am/20 mg pm. OK to take metolazone  PRN. Continue to follow with ICM Clinic - Continue Entresto  97/103 mg bid. - Continue spiro 12.5 mg daily. - Off bisoprolol  in 2018. On sotalol  80 mg bid.  - Continue digoxin  0.0625 mg every other day.  - BP on low side. Drop hydralazine  from 37.5 to 25 tid  + Imdur  30 mg bid. - Continue Farxiga 10 mg daily. No GU symptoms. - Labs today  - This is the best I have seen him in some time.   2) CKD stage III  - Followed  previously by nephrology. - Baseline Creatinine 1.5-1.9 - Continue Farxiga - Labs today  3) HTN  - BP low. Drop hydralazine  as above  4) OSA  - Continue Bipap   5) PAF/AFL - S/P multiple DCCVs. Most recent 07/29/17.  - Had episode of AFL with ICD shock in 2/18 at time of influenza - Recurrent AF in 12/23-1/24 in setting of COVID, - Refused ablation  - CHA2DS2VASC of at least 3. - Continue Xarelto  20 mg daily.No bleeding - Remains in NSR on Sotalol  per EP. ECG with normal QTc. Check K and Mg today  6) Obesity   - Body mass index is 51.26 kg/m.  - On Ozempic. Followed by Dr. Onita  - Encouraged him to continue weight loss efforts  7) Amiodarone  thyroxicity   - Off Amiodarone . Completed Methimazole  treatment per PCP.   - No change.  8) DM2 - Continue insulin  + Farxiga. - Followed by Dr. Onita Toribio Fuel, MD  11/12/2023 10:44 AM

## 2023-11-12 NOTE — Progress Notes (Signed)
  Echocardiogram 2D Echocardiogram has been performed.  Eri Mcevers L Kadyn Chovan RDCS 11/12/2023, 10:09 AM

## 2023-11-23 ENCOUNTER — Ambulatory Visit: Payer: 59 | Attending: Cardiology

## 2023-11-23 DIAGNOSIS — Z95810 Presence of automatic (implantable) cardiac defibrillator: Secondary | ICD-10-CM | POA: Diagnosis not present

## 2023-11-23 DIAGNOSIS — I5022 Chronic systolic (congestive) heart failure: Secondary | ICD-10-CM

## 2023-11-25 NOTE — Progress Notes (Signed)
EPIC Encounter for ICM Monitoring  Patient Name: Gary Hutchinson is a 63 y.o. male Date: 11/25/2023 Primary Care Physican: Creola Corn, MD Primary Cardiologist: [name] Electrophysiologist: Elberta Fortis Bi-V Pacing: 98.2%    06/19/2023 Weight: 305 lbs 09/16/2023 Weight: 311 lbs 10/23/2023 Weight: 313 lbs 11/12/2023  Weight: 312 lbs 11/25/2023 Weight: 312 lbs   Time in AT/AF  <0.1 hr/day (<0.1%) Battery Longevity:  4 month (next battery check 2/28)                     Spoke with patient and heart failure questions reviewed.  Transmission results reviewed.  Pt asymptomatic for fluid accumulation.  Reports feeling well at this time and voices no complaints.     Diet: Not limiting salt or fluids at this time   Optivol thoracic impedance suggesting normal fluid levels with the exception of possible fluid accumulation from 1/20-1/28 and 2/8-2/19.    He took Metolazone during decreased impedance.     Prescribed dosage:  Torsemide 20 mg Take 2 tablets (40 mg total) by mouth every morning and 1 tablet (20 total) at bedtime.  He reports Dr Gala Romney has approved that he can take extra Torsemide or Metolazone when needed.  Metolazone 2.5 mg 1 tablet as needed.   Spironolactone 12.5 mg take 1 tablet daily   Labs:   11/12/2023 Creatinine 2.20, BUN 69, Potassium 4.5, Sodium 136, GFR 33 07/21/2023 Creatinine 2.00, BUN 54, Potassium 4.4, Sodium 141, GFR 37 04/27/2023 Creatinine 1.80, BUN 39, Potassium 3.9, Sodium 137, GFR 42 03/09/2023 Creatinine 2.02, BUN 89, Potassium 3.4, Sodium 138  01/19/2023 Creatinine 1.88, BUN 53, Potassium 4.1, Sodium 141, GFR 40 10/27/2022 Creatinine 1.82, BUN 54, Potassium 4.1, Sodium 134, GFR 42 10/17/2022 Creatinine 1.90, BUN 47, Potassium 4.3, Sodium 141 A complete set of results can be found in Results Review.   Recommendations:  No changes and encouraged to call if experiencing any fluid symptoms.   Follow-up plan: ICM clinic phone appointment on 12/28/2023.   91 day  device clinic remote transmission 02/02/2024.     EP/Cardiology Office Visits:  05/11/2024 with HF clinic.  Recall 02/04/2024 with Dr Elberta Fortis.     Copy of ICM check sent to Dr. Elberta Fortis.     3 month ICM trend: 11/23/2023.    12-14 Month ICM trend:     Karie Soda, RN 11/25/2023 2:28 PM

## 2023-12-01 ENCOUNTER — Other Ambulatory Visit (HOSPITAL_COMMUNITY): Payer: Self-pay | Admitting: Internal Medicine

## 2023-12-04 ENCOUNTER — Ambulatory Visit (INDEPENDENT_AMBULATORY_CARE_PROVIDER_SITE_OTHER): Payer: 59

## 2023-12-04 DIAGNOSIS — I428 Other cardiomyopathies: Secondary | ICD-10-CM

## 2023-12-05 LAB — CUP PACEART REMOTE DEVICE CHECK
Battery Remaining Longevity: 3 mo
Battery Voltage: 2.82 V
Brady Statistic AP VP Percent: 0.09 %
Brady Statistic AP VS Percent: 0.01 %
Brady Statistic AS VP Percent: 98.49 %
Brady Statistic AS VS Percent: 1.41 %
Brady Statistic RA Percent Paced: 0.1 %
Brady Statistic RV Percent Paced: 3.26 %
Date Time Interrogation Session: 20250228042506
HighPow Impedance: 84 Ohm
Implantable Lead Connection Status: 753985
Implantable Lead Connection Status: 753985
Implantable Lead Connection Status: 753985
Implantable Lead Implant Date: 20090901
Implantable Lead Implant Date: 20170210
Implantable Lead Implant Date: 20170210
Implantable Lead Location: 753858
Implantable Lead Location: 753859
Implantable Lead Location: 753860
Implantable Lead Model: 4598
Implantable Lead Model: 5076
Implantable Lead Model: 6935
Implantable Pulse Generator Implant Date: 20170210
Lead Channel Impedance Value: 1026 Ohm
Lead Channel Impedance Value: 1026 Ohm
Lead Channel Impedance Value: 1064 Ohm
Lead Channel Impedance Value: 342 Ohm
Lead Channel Impedance Value: 342 Ohm
Lead Channel Impedance Value: 418 Ohm
Lead Channel Impedance Value: 456 Ohm
Lead Channel Impedance Value: 456 Ohm
Lead Channel Impedance Value: 513 Ohm
Lead Channel Impedance Value: 551 Ohm
Lead Channel Impedance Value: 665 Ohm
Lead Channel Impedance Value: 779 Ohm
Lead Channel Impedance Value: 836 Ohm
Lead Channel Pacing Threshold Amplitude: 0.625 V
Lead Channel Pacing Threshold Amplitude: 0.875 V
Lead Channel Pacing Threshold Amplitude: 1.5 V
Lead Channel Pacing Threshold Pulse Width: 0.4 ms
Lead Channel Pacing Threshold Pulse Width: 0.4 ms
Lead Channel Pacing Threshold Pulse Width: 0.4 ms
Lead Channel Sensing Intrinsic Amplitude: 11.375 mV
Lead Channel Sensing Intrinsic Amplitude: 11.375 mV
Lead Channel Sensing Intrinsic Amplitude: 2.125 mV
Lead Channel Sensing Intrinsic Amplitude: 2.125 mV
Lead Channel Setting Pacing Amplitude: 1.5 V
Lead Channel Setting Pacing Amplitude: 2 V
Lead Channel Setting Pacing Amplitude: 2.5 V
Lead Channel Setting Pacing Pulse Width: 0.4 ms
Lead Channel Setting Pacing Pulse Width: 0.4 ms
Lead Channel Setting Sensing Sensitivity: 0.3 mV
Zone Setting Status: 755011
Zone Setting Status: 755011

## 2023-12-15 ENCOUNTER — Other Ambulatory Visit (HOSPITAL_COMMUNITY): Payer: Self-pay | Admitting: Internal Medicine

## 2023-12-15 NOTE — Progress Notes (Signed)
 Remote ICD transmission.

## 2023-12-15 NOTE — Addendum Note (Signed)
 Addended by: Elease Etienne A on: 12/15/2023 09:02 AM   Modules accepted: Orders

## 2023-12-28 ENCOUNTER — Ambulatory Visit: Payer: 59 | Attending: Cardiology

## 2023-12-28 DIAGNOSIS — I5022 Chronic systolic (congestive) heart failure: Secondary | ICD-10-CM | POA: Diagnosis not present

## 2023-12-28 DIAGNOSIS — Z95810 Presence of automatic (implantable) cardiac defibrillator: Secondary | ICD-10-CM | POA: Diagnosis not present

## 2024-01-01 ENCOUNTER — Telehealth: Payer: Self-pay

## 2024-01-01 NOTE — Progress Notes (Addendum)
 Spoke with patient and heart failure questions reviewed.  Transmission results reviewed.  Pt asymptomatic for fluid accumulation.  Reports feeling well at this time and voices no complaints.  Pt was on vacation during decreased impedance.   Weight: 316 lbs and baseline around 313 lbs.  No changes and encouraged to call if experiencing any fluid symptoms. Appointment 02/11/2024 with Otilio Saber, PA.

## 2024-01-01 NOTE — Progress Notes (Signed)
 EPIC Encounter for ICM Monitoring  Patient Name: ARIQ KHAMIS is a 63 y.o. male Date: 01/01/2024 Primary Care Physican: Creola Corn, MD Primary Cardiologist: [name] Electrophysiologist: Elberta Fortis Bi-V Pacing: 98.3%    06/19/2023 Weight: 305 lbs 09/16/2023 Weight: 311 lbs 10/23/2023 Weight: 313 lbs 11/12/2023  Weight: 312 lbs 11/25/2023 Weight: 312 lbs   Time in AT/AF  0.0 hr/day (0.0%) Battery Longevity:  3 months                     Attempted call to patient and unable to reach.   Transmission results reviewed.      Diet: Not limiting salt or fluids at this time   Optivol thoracic impedance suggesting normal fluid levels with the exception of possible fluid accumulation from 3/6-3/19.       Prescribed dosage:  Torsemide 20 mg Take 2 tablets (40 mg total) by mouth every morning and 1 tablet (20 total) at bedtime.  He reports Dr Gala Romney has approved that he can take extra Torsemide or Metolazone when needed.  Potassium 20 mEq take 2 tablets (40 mEq total) by mouth as directed.  Please take 40 mEq when you take your Metolazone.  Metolazone 2.5 mg 1 tablet as needed.   Spironolactone 12.5 mg take 1 tablet daily   Labs:   11/12/2023 Creatinine 2.20, BUN 69, Potassium 4.5, Sodium 136, eGFR 33 07/21/2023 Creatinine 2.00, BUN 54, Potassium 4.4, Sodium 141, eGFR 37 04/27/2023 Creatinine 1.80, BUN 39, Potassium 3.9, Sodium 137, eGFR 42 03/09/2023 Creatinine 2.02, BUN 89, Potassium 3.4, Sodium 138  01/19/2023 Creatinine 1.88, BUN 53, Potassium 4.1, Sodium 141, eGFR 40 10/27/2022 Creatinine 1.82, BUN 54, Potassium 4.1, Sodium 134, eGFR 42 10/17/2022 Creatinine 1.90, BUN 47, Potassium 4.3, Sodium 141 A complete set of results can be found in Results Review.   Recommendations:  Unable to reach.     Follow-up plan: ICM clinic phone appointment on 02/01/2024.   91 day device clinic remote transmission 02/04/2024.     EP/Cardiology Office Visits:  05/11/2024 with HF clinic.  Recall 02/04/2024  with Dr Elberta Fortis.     Copy of ICM check sent to Dr. Elberta Fortis.     3 month ICM trend: 12/28/2023.    12-14 Month ICM trend:     Karie Soda, RN 01/01/2024 9:21 AM

## 2024-01-01 NOTE — Telephone Encounter (Signed)
 Remote ICM transmission received.  Attempted call to patient regarding ICM remote transmission and no answer.

## 2024-01-01 NOTE — Addendum Note (Signed)
 Addended by: Elease Etienne A on: 01/01/2024 12:19 PM   Modules accepted: Orders, Level of Service

## 2024-01-01 NOTE — Progress Notes (Signed)
 Remote ICD transmission.

## 2024-01-04 ENCOUNTER — Ambulatory Visit (INDEPENDENT_AMBULATORY_CARE_PROVIDER_SITE_OTHER): Payer: 59

## 2024-01-04 DIAGNOSIS — I5022 Chronic systolic (congestive) heart failure: Secondary | ICD-10-CM

## 2024-01-04 DIAGNOSIS — I428 Other cardiomyopathies: Secondary | ICD-10-CM

## 2024-01-21 ENCOUNTER — Emergency Department (HOSPITAL_BASED_OUTPATIENT_CLINIC_OR_DEPARTMENT_OTHER)
Admission: EM | Admit: 2024-01-21 | Discharge: 2024-01-21 | Disposition: A | Discharge status: Home / Self Care | Attending: Emergency Medicine | Admitting: Emergency Medicine

## 2024-01-21 ENCOUNTER — Emergency Department (HOSPITAL_BASED_OUTPATIENT_CLINIC_OR_DEPARTMENT_OTHER)

## 2024-01-21 ENCOUNTER — Other Ambulatory Visit: Payer: Self-pay

## 2024-01-21 ENCOUNTER — Encounter (HOSPITAL_BASED_OUTPATIENT_CLINIC_OR_DEPARTMENT_OTHER): Payer: Self-pay

## 2024-01-21 DIAGNOSIS — I4891 Unspecified atrial fibrillation: Secondary | ICD-10-CM | POA: Insufficient documentation

## 2024-01-21 DIAGNOSIS — R110 Nausea: Secondary | ICD-10-CM | POA: Insufficient documentation

## 2024-01-21 DIAGNOSIS — Z7901 Long term (current) use of anticoagulants: Secondary | ICD-10-CM | POA: Diagnosis not present

## 2024-01-21 DIAGNOSIS — R1084 Generalized abdominal pain: Secondary | ICD-10-CM | POA: Diagnosis present

## 2024-01-21 DIAGNOSIS — I5022 Chronic systolic (congestive) heart failure: Secondary | ICD-10-CM | POA: Diagnosis not present

## 2024-01-21 DIAGNOSIS — J45909 Unspecified asthma, uncomplicated: Secondary | ICD-10-CM | POA: Insufficient documentation

## 2024-01-21 DIAGNOSIS — K5900 Constipation, unspecified: Secondary | ICD-10-CM | POA: Diagnosis not present

## 2024-01-21 DIAGNOSIS — K921 Melena: Secondary | ICD-10-CM

## 2024-01-21 DIAGNOSIS — E119 Type 2 diabetes mellitus without complications: Secondary | ICD-10-CM | POA: Diagnosis not present

## 2024-01-21 DIAGNOSIS — Z95810 Presence of automatic (implantable) cardiac defibrillator: Secondary | ICD-10-CM | POA: Diagnosis not present

## 2024-01-21 DIAGNOSIS — I110 Hypertensive heart disease with heart failure: Secondary | ICD-10-CM | POA: Insufficient documentation

## 2024-01-21 LAB — COMPREHENSIVE METABOLIC PANEL WITH GFR
ALT: 20 U/L (ref 0–44)
AST: 22 U/L (ref 15–41)
Albumin: 3.5 g/dL (ref 3.5–5.0)
Alkaline Phosphatase: 98 U/L (ref 38–126)
Anion gap: 12 (ref 5–15)
BUN: 65 mg/dL — ABNORMAL HIGH (ref 8–23)
CO2: 22 mmol/L (ref 22–32)
Calcium: 8.8 mg/dL — ABNORMAL LOW (ref 8.9–10.3)
Chloride: 102 mmol/L (ref 98–111)
Creatinine, Ser: 1.79 mg/dL — ABNORMAL HIGH (ref 0.61–1.24)
GFR, Estimated: 42 mL/min — ABNORMAL LOW (ref >60)
Glucose, Bld: 222 mg/dL — ABNORMAL HIGH (ref 70–99)
Potassium: 3.8 mmol/L (ref 3.5–5.1)
Sodium: 136 mmol/L (ref 135–145)
Total Bilirubin: 0.5 mg/dL (ref 0.0–1.2)
Total Protein: 7.5 g/dL (ref 6.5–8.1)

## 2024-01-21 LAB — CBC WITH DIFFERENTIAL/PLATELET
Abs Immature Granulocytes: 0.03 10*3/uL (ref 0.00–0.07)
Basophils Absolute: 0.1 10*3/uL (ref 0.0–0.1)
Basophils Relative: 1 %
Eosinophils Absolute: 0.3 10*3/uL (ref 0.0–0.5)
Eosinophils Relative: 4 %
HCT: 35.7 % — ABNORMAL LOW (ref 39.0–52.0)
Hemoglobin: 11.3 g/dL — ABNORMAL LOW (ref 13.0–17.0)
Immature Granulocytes: 0 %
Lymphocytes Relative: 17 %
Lymphs Abs: 1.3 10*3/uL (ref 0.7–4.0)
MCH: 26.2 pg (ref 26.0–34.0)
MCHC: 31.7 g/dL (ref 30.0–36.0)
MCV: 82.6 fL (ref 80.0–100.0)
Monocytes Absolute: 0.5 10*3/uL (ref 0.1–1.0)
Monocytes Relative: 7 %
Neutro Abs: 5.2 10*3/uL (ref 1.7–7.7)
Neutrophils Relative %: 71 %
Platelets: 168 10*3/uL (ref 150–400)
RBC: 4.32 MIL/uL (ref 4.22–5.81)
RDW: 15.6 % — ABNORMAL HIGH (ref 11.5–15.5)
WBC: 7.4 10*3/uL (ref 4.0–10.5)
nRBC: 0 % (ref 0.0–0.2)

## 2024-01-21 LAB — OCCULT BLOOD X 1 CARD TO LAB, STOOL: Fecal Occult Bld: POSITIVE — AB

## 2024-01-21 LAB — LIPASE, BLOOD: Lipase: 34 U/L (ref 11–51)

## 2024-01-21 MED ORDER — SENNOSIDES-DOCUSATE SODIUM 8.6-50 MG PO TABS
1.0000 | ORAL_TABLET | Freq: Every evening | ORAL | 0 refills | Status: DC | PRN
Start: 2024-01-21 — End: 2024-03-16

## 2024-01-21 MED ORDER — HYDROCORTISONE ACETATE 25 MG RE SUPP
25.0000 mg | Freq: Two times a day (BID) | RECTAL | 0 refills | Status: AC
Start: 2024-01-21 — End: 2024-01-28

## 2024-01-21 NOTE — ED Provider Notes (Signed)
 Emergency Department Provider Note   I have reviewed the triage vital signs and the nursing notes.   HISTORY  Chief Complaint Abdominal Pain   HPI Gary Hutchinson is a 63 y.o. male past history reviewed below including A-fib on Xarelto  who presents emergency department with diffuse abdominal discomfort with nausea, constipation, streaky bright red blood in the stool.  Patient has had bright red blood slightly increasing over the past 3 days.  He states he has been on Ozempic for the past year and symptoms of abdominal discomfort and constipation have been intermittent and moderate since starting that medication.  He has discussed this with his PCP and states it is felt that the benefit, at this point, outweigh any risks or mild discomfort. No fever. No black stool. No CP or SOB.   Past Medical History:  Diagnosis Date   AICD (automatic cardioverter/defibrillator) present    Medtronic   Alcohol abuse    now quit   Anemia    iron defi   Arthritis    "fingers, knees, some days all my joints ache" (11/16/2015)   Asthma    Cholecystitis    Chronic systolic congestive heart failure (HCC)    a. RHC (02/2011) RA 31/27, RV 71/27, PA67/45, PCWP 46, PA 49%, Fick CO: 3.4 b. ECHO (03/2014) EF 30-35%, grade II DD, mild MR, RV poorly visualized appears mildly decreased c. RHC (05/2014) RA 13, RV 54/4/11, PA 60/22 (37), PCWP 17, Fick CO/CI: 4.4 / 1.9, Thermo CO/CI: 3.8 /1.6, PVR 5.2 WU, PA 57% and 59%   Diverticulosis of colon    Gout    Hemorrhoids, internal    Hyperlipidemia    Hypertension    Morbid obesity (HCC)    Nonischemic cardiomyopathy (HCC)    a. LHC (02/2011) normal coronaries   OSA on CPAP    Paroxysmal atrial fibrillation (HCC)    Pneumonia 2000s X 1   Pulmonary hypertension (HCC)    Renal insufficiency    Type II diabetes mellitus (HCC)    Ventricular tachycardia (HCC)    s/p MDT ICD implant    Review of Systems  Constitutional: No fever/chills Cardiovascular: Denies  chest pain. Respiratory: Denies shortness of breath. Gastrointestinal: Positive abdominal pain. Positive nausea, no vomiting.  No diarrhea. Positive constipation. Positive BRBPR. Musculoskeletal: Negative for back pain. Skin: Negative for rash. Neurological: Negative for headaches.  ____________________________________________   PHYSICAL EXAM:  VITAL SIGNS: ED Triage Vitals  Encounter Vitals Group     BP 01/21/24 0841 (!) 141/92     Pulse Rate 01/21/24 0839 88     Resp 01/21/24 0839 15     Temp 01/21/24 0842 98.6 F (37 C)     Temp src --      SpO2 01/21/24 0839 99 %     Weight 01/21/24 0839 (!) 317 lb (143.8 kg)   Constitutional: Alert and oriented. Well appearing and in no acute distress. Eyes: Conjunctivae are normal.  Head: Atraumatic. Nose: No congestion/rhinnorhea. Mouth/Throat: Mucous membranes are moist. Neck: No stridor.  Cardiovascular: Normal rate, regular rhythm. Good peripheral circulation. Grossly normal heart sounds.   Respiratory: Normal respiratory effort.  No retractions. Lungs CTAB. Gastrointestinal: Soft and nontender. No distention. Rectal exam performed after obtaining patient's verbal consent and with RT chaperone at bedside. Small, non-thrombosed hemorrhoid noted at 6 o'clock position. No fissure. Scant, dark red blood on DRE. No masses.  Musculoskeletal: No gross deformities of extremities. Neurologic:  Normal speech and language.  Skin:  Skin is  warm, dry and intact. No rash noted.  ____________________________________________   LABS (all labs ordered are listed, but only abnormal results are displayed)  Labs Reviewed  COMPREHENSIVE METABOLIC PANEL WITH GFR - Abnormal; Notable for the following components:      Result Value   Glucose, Bld 222 (*)    BUN 65 (*)    Creatinine, Ser 1.79 (*)    Calcium  8.8 (*)    GFR, Estimated 42 (*)    All other components within normal limits  CBC WITH DIFFERENTIAL/PLATELET - Abnormal; Notable for the  following components:   Hemoglobin 11.3 (*)    HCT 35.7 (*)    RDW 15.6 (*)    All other components within normal limits  OCCULT BLOOD X 1 CARD TO LAB, STOOL - Abnormal; Notable for the following components:   Fecal Occult Bld POSITIVE (*)    All other components within normal limits  LIPASE, BLOOD   ____________________________________________  EKG   EKG Interpretation Date/Time:  Thursday January 21 2024 08:40:09 EDT Ventricular Rate:  83 PR Interval:  178 QRS Duration:  123 QT Interval:  416 QTC Calculation: 489 R Axis:   -89  Text Interpretation: A sensed V paced rhythm Nonspecific IVCD with LAD Inferior infarct, old Probable anterolateral infarct, old Confirmed by Abby Hocking [phone]) on 01/21/2024 8:53:33 AM        ____________________________________________  RADIOLOGY  CT ABDOMEN PELVIS WO CONTRAST Result Date: 01/21/2024 CLINICAL DATA:  Abdominal pain, acute, nonlocalized EXAM: CT ABDOMEN AND PELVIS WITHOUT CONTRAST TECHNIQUE: Multidetector CT imaging of the abdomen and pelvis was performed following the standard protocol without IV contrast. Of note, the lack of intravenous contrast limits evaluation of the solid organ parenchyma and vascularity. RADIATION DOSE REDUCTION: This exam was performed according to the departmental dose-optimization program which includes automated exposure control, adjustment of the mA and/or kV according to patient size and/or use of iterative reconstruction technique. COMPARISON:  March 23, 2021 FINDINGS: Lower chest: No focal airspace consolidation or pleural effusion.Cardiomegaly. Pacemaker leads in the right atrium, right ventricle, and coronary sinus. Multifocal subsegmental atelectasis in the lungs. Hepatobiliary: No mass.No radiopaque stones or wall thickening of the gallbladder.No intrahepatic or extrahepatic biliary ductal dilation. Pancreas: No mass or main ductal dilation.No peripancreatic inflammation or fluid collection. Spleen:  Normal size. No mass. Adrenals/Urinary Tract: No adrenal masses. No renal mass. No hydronephrosis or nephrolithiasis. The urinary bladder is distended without focal abnormality. Stomach/Bowel: The stomach is decompressed without focal abnormality. No small bowel wall thickening or inflammation. No small bowel obstruction. Normal appendix. Descending and sigmoid colonic diverticulosis. No changes of acute diverticulitis. Vascular/Lymphatic: No aortic aneurysm. Scattered aortoiliac atherosclerosis. No intraabdominal or pelvic lymphadenopathy. Reproductive: No prostatomegaly.No free pelvic fluid. Other: No pneumoperitoneum, ascites, or mesenteric inflammation. Unchanged peripherally calcified hyperdense nodule in the sigmoid mesocolon (axial 60), likely changes of remote fat necrosis. Musculoskeletal: No acute fracture or destructive lesion.Unchanged, small fat containing umbilical hernia with a moderate sized fat containing right paraumbilical hernia. No changes of vascular compromise. IMPRESSION: 1. No acute intra-abdominal or pelvic abnormality. 2. Descending and sigmoid colonic diverticulosis. No changes of acute diverticulitis. Aortic Atherosclerosis (ICD10-I70.0). Electronically Signed   By: Rance Burrows M.D.   On: 01/21/2024 13:08    ____________________________________________   PROCEDURES  Procedure(s) performed:   Procedures  None  ____________________________________________   INITIAL IMPRESSION / ASSESSMENT AND PLAN / ED COURSE  Pertinent labs & imaging results that were available during my care of the patient were reviewed by me and  considered in my medical decision making (see chart for details).   This patient is Presenting for Evaluation of abdominal pain, which does require a range of treatment options, and is a complaint that involves a high risk of morbidity and mortality.  The Differential Diagnoses includes but is not exclusive to acute cholecystitis, intrathoracic causes  for epigastric abdominal pain, gastritis, duodenitis, pancreatitis, small bowel or large bowel obstruction, abdominal aortic aneurysm, hernia, gastritis, etc.  Clinical Laboratory Tests Ordered, included fecal occult positive. CMP with creatinine 1.79. CBC without leukocytosis.   Radiologic Tests Ordered, included CT abdomen/pelvis. I independently interpreted the images and agree with radiology interpretation.   Medical Decision Making: Summary:  Patient with abdominal pain and small volume BRBPR. Plan for labs and CT.   Reevaluation with update and discussion with patient. Labs reassuring. CT abdomen/pelvis without acute finding. Plan for hemorrhoid treatment and strong recommendation to follow with GI for colonoscopy.   Patient's presentation is most consistent with acute presentation with potential threat to life or bodily function.   Disposition: discharge  ____________________________________________  FINAL CLINICAL IMPRESSION(S) / ED DIAGNOSES  Final diagnoses:  Generalized abdominal pain  Blood in stool     NEW OUTPATIENT MEDICATIONS STARTED DURING THIS VISIT:  Discharge Medication List as of 01/21/2024  1:15 PM     START taking these medications   Details  hydrocortisone  (ANUSOL -HC) 25 MG suppository Place 1 suppository (25 mg total) rectally 2 (two) times daily for 7 days., Starting Thu 01/21/2024, Until Thu 01/28/2024, Normal    senna-docusate (SENOKOT-S) 8.6-50 MG tablet Take 1 tablet by mouth at bedtime as needed for moderate constipation., Starting Thu 01/21/2024, Normal        Note:  This document was prepared using Dragon voice recognition software and may include unintentional dictation errors.  Abby Hocking, MD, [facility] Emergency Medicine    Akasia Ahmad, Shereen Dike, MD 01/22/24 [phone]

## 2024-01-21 NOTE — ED Triage Notes (Addendum)
 Abdominal discomfort intermittent x 1 year. Constipation, nausea. Denies vomiting. Blood in stool x 3 the 24 hrs. Blood is bright red and getting darker Using miralax .  Reports using Ozempic over 1 year

## 2024-01-21 NOTE — ED Notes (Signed)
 D/c paperwork reviewed with pt, including prescriptions and follow up care.  All questions and/or concerns addressed at time of d/c.  No further needs expressed. . Pt verbalized understanding, Wheeled by ED staff to ED exit, NAD.

## 2024-01-21 NOTE — Discharge Instructions (Signed)
 You were seen in the emerged from today with abdominal discomfort, constipation, blood.  I am treating you for hemorrhoids but strongly recommend you obtain a colonoscopy as soon as possible.  I have placed a referral in our system for you to see a GI doctor but you may need to go through your PCP depending on your insurance coverage.  Please call your PCP today to make them aware of your ED visit and symptoms.  Your lab work here is reassuring.  I have also called in some medication for constipation.

## 2024-01-28 ENCOUNTER — Encounter: Payer: Self-pay | Admitting: Emergency Medicine

## 2024-02-01 ENCOUNTER — Encounter

## 2024-02-02 ENCOUNTER — Encounter: Payer: Self-pay | Admitting: Emergency Medicine

## 2024-02-04 ENCOUNTER — Ambulatory Visit (INDEPENDENT_AMBULATORY_CARE_PROVIDER_SITE_OTHER)

## 2024-02-04 DIAGNOSIS — I428 Other cardiomyopathies: Secondary | ICD-10-CM

## 2024-02-05 NOTE — Progress Notes (Signed)
 No ICM remote transmission received for 02/01/2024 and next ICM transmission scheduled for 02/08/2024.

## 2024-02-08 ENCOUNTER — Ambulatory Visit: Attending: Cardiology

## 2024-02-08 DIAGNOSIS — I5022 Chronic systolic (congestive) heart failure: Secondary | ICD-10-CM | POA: Diagnosis not present

## 2024-02-08 DIAGNOSIS — Z95810 Presence of automatic (implantable) cardiac defibrillator: Secondary | ICD-10-CM | POA: Diagnosis not present

## 2024-02-09 ENCOUNTER — Telehealth: Payer: Self-pay

## 2024-02-09 NOTE — Telephone Encounter (Signed)
 Spoke with patient and ICM remote transmission received.

## 2024-02-09 NOTE — Progress Notes (Signed)
 EPIC Encounter for ICM Monitoring  Patient Name: Gary Hutchinson is a 63 y.o. male Date: 02/09/2024 Primary Care Physican: Margarete Sharps, MD Primary Cardiologist: [name] Electrophysiologist: Lawana Pray Bi-V Pacing: 98.2%    06/19/2023 Weight: 305 lbs 09/16/2023 Weight: 311 lbs 10/23/2023 Weight: 313 lbs 11/12/2023  Weight: 312 lbs 11/25/2023 Weight: 312 lbs 02/09/2024 Weight:  319 lbs   Time in AT/AF  0.0 hr/day (0.0%) Battery Longevity:  2 months                     Spoke with patient and heart failure questions reviewed.  Transmission results reviewed.  Pt reports weight gain of 4-5 lbs and current weight is 319 lbs.  He took 1/2 tablet Metolazone  on 5/3 which correlates with improvement of impedance.   Diet: Not limiting salt or fluids at this time   Optivol thoracic impedance suggesting normal fluid levels with the exception of possible fluid accumulation starting 4/26 and returning toward baseline.       Prescribed dosage:  Torsemide  20 mg Take 2 tablets (40 mg total) by mouth every morning and 1 tablet (20 total) at bedtime.  He reports Dr [name] has approved that he can take extra Torsemide  or Metolazone  when needed.  Potassium 20 mEq take 2 tablets (40 mEq total) by mouth as directed.  Please take 40 mEq when you take your Metolazone .  Metolazone  2.5 mg 1 tablet as needed.   Spironolactone  12.5 mg take 1 tablet daily   Labs:   11/12/2023 Creatinine 2.20, BUN 69, Potassium 4.5, Sodium 136, eGFR 33 07/21/2023 Creatinine 2.00, BUN 54, Potassium 4.4, Sodium 141, eGFR 37 04/27/2023 Creatinine 1.80, BUN 39, Potassium 3.9, Sodium 137, eGFR 42 03/09/2023 Creatinine 2.02, BUN 89, Potassium 3.4, Sodium 138  01/19/2023 Creatinine 1.88, BUN 53, Potassium 4.1, Sodium 141, eGFR 40 10/27/2022 Creatinine 1.82, BUN 54, Potassium 4.1, Sodium 134, eGFR 42 10/17/2022 Creatinine 1.90, BUN 47, Potassium 4.3, Sodium 141 A complete set of results can be found in Results Review.   Recommendations:  Pt  will self adjusts Torsemide  and will additional 20 mg x 2 days.  Will have fluid levels rechecked on 5/8 at office visit.   Follow-up plan: ICM clinic phone appointment on 02/22/2024 to recheck fluid levels.   91 day device clinic remote transmission 02/04/2024.     EP/Cardiology Office Visits:  05/11/2024 with HF clinic.  02/11/2024 with Michaelle Adolphus, PA.     Copy of ICM check sent to Dr. Lawana Pray.   Sent to Dr Julane Ny as [name].    3 month ICM trend: 02/09/2024.    12-14 Month ICM trend:     Almyra Jain, RN 02/09/2024 3:17 PM

## 2024-02-11 ENCOUNTER — Ambulatory Visit: Attending: Student | Admitting: Student

## 2024-02-11 ENCOUNTER — Encounter: Payer: Self-pay | Admitting: Student

## 2024-02-11 VITALS — BP 96/60 | HR 96 | Ht 65.0 in | Wt 315.0 lb

## 2024-02-11 DIAGNOSIS — I447 Left bundle-branch block, unspecified: Secondary | ICD-10-CM | POA: Diagnosis not present

## 2024-02-11 DIAGNOSIS — N1832 Chronic kidney disease, stage 3b: Secondary | ICD-10-CM

## 2024-02-11 DIAGNOSIS — I10 Essential (primary) hypertension: Secondary | ICD-10-CM

## 2024-02-11 DIAGNOSIS — I428 Other cardiomyopathies: Secondary | ICD-10-CM

## 2024-02-11 DIAGNOSIS — I5022 Chronic systolic (congestive) heart failure: Secondary | ICD-10-CM

## 2024-02-11 LAB — CUP PACEART INCLINIC DEVICE CHECK
Battery Remaining Longevity: 2 mo
Battery Voltage: 2.79 V
Brady Statistic AP VP Percent: 0.2 %
Brady Statistic AP VS Percent: 0.01 %
Brady Statistic AS VP Percent: 98.3 %
Brady Statistic AS VS Percent: 1.49 %
Brady Statistic RA Percent Paced: 0.21 %
Brady Statistic RV Percent Paced: 4.32 %
Date Time Interrogation Session: 20250508134212
HighPow Impedance: 88 Ohm
Implantable Lead Connection Status: 753985
Implantable Lead Connection Status: 753985
Implantable Lead Connection Status: 753985
Implantable Lead Implant Date: 20090901
Implantable Lead Implant Date: 20170210
Implantable Lead Implant Date: 20170210
Implantable Lead Location: 753858
Implantable Lead Location: 753859
Implantable Lead Location: 753860
Implantable Lead Model: 4598
Implantable Lead Model: 5076
Implantable Lead Model: 6935
Implantable Pulse Generator Implant Date: 20170210
Lead Channel Impedance Value: 1026 Ohm
Lead Channel Impedance Value: 1197 Ohm
Lead Channel Impedance Value: 1235 Ohm
Lead Channel Impedance Value: 1292 Ohm
Lead Channel Impedance Value: 361 Ohm
Lead Channel Impedance Value: 399 Ohm
Lead Channel Impedance Value: 456 Ohm
Lead Channel Impedance Value: 532 Ohm
Lead Channel Impedance Value: 551 Ohm
Lead Channel Impedance Value: 646 Ohm
Lead Channel Impedance Value: 703 Ohm
Lead Channel Impedance Value: 779 Ohm
Lead Channel Impedance Value: 931 Ohm
Lead Channel Pacing Threshold Amplitude: 0.75 V
Lead Channel Pacing Threshold Amplitude: 0.875 V
Lead Channel Pacing Threshold Amplitude: 1.75 V
Lead Channel Pacing Threshold Pulse Width: 0.4 ms
Lead Channel Pacing Threshold Pulse Width: 0.4 ms
Lead Channel Pacing Threshold Pulse Width: 0.4 ms
Lead Channel Sensing Intrinsic Amplitude: 14.875 mV
Lead Channel Sensing Intrinsic Amplitude: 15.75 mV
Lead Channel Sensing Intrinsic Amplitude: 2.125 mV
Lead Channel Sensing Intrinsic Amplitude: 2.875 mV
Lead Channel Setting Pacing Amplitude: 1.5 V
Lead Channel Setting Pacing Amplitude: 2 V
Lead Channel Setting Pacing Amplitude: 3 V
Lead Channel Setting Pacing Pulse Width: 0.4 ms
Lead Channel Setting Pacing Pulse Width: 0.4 ms
Lead Channel Setting Sensing Sensitivity: 0.3 mV
Zone Setting Status: 755011
Zone Setting Status: 755011

## 2024-02-11 NOTE — Progress Notes (Signed)
  Electrophysiology Office Note:   ID:  Gary Hutchinson, DOB February 01, 1961, MRN [id number]  Primary Cardiologist: None Electrophysiologist: Will Cortland Ding, MD      History of Present Illness:   Gary Hutchinson is a 63 y.o. male with h/o HFrEF, LBBB s/p CRT-D, VT, parox AF, and HTN seen today for routine electrophysiology followup.   Since last being seen in our clinic the patient reports doing well. Overall, he denies chest pain, palpitations, dyspnea, PND, orthopnea, nausea, vomiting, dizziness, syncope, edema, weight gain, or early satiety.   Review of systems complete and found to be negative unless listed in HPI.   EP Information / Studies Reviewed:    EKG is not ordered today. EKG from 01/21/2024 reviewed which showed AS-VP at 83 bpm with stable intervals on Sotalol        ICD Interrogation-  reviewed in detail today,  See PACEART report.  Arrhythmia/Device History MDT CRT-D, impl 2017; dx CHF    Physical Exam:   VS:  BP 96/60 (BP Location: Left Arm, Patient Position: Sitting, Cuff Size: Large)   Pulse 96   Ht 5\' 5"  (1.651 m)   Wt (!) 315 lb (142.9 kg)   SpO2 92%   BMI 52.42 kg/m    Wt Readings from Last 3 Encounters:  02/11/24 (!) 315 lb (142.9 kg)  01/21/24 (!) 317 lb (143.8 kg)  11/12/23 (!) 312 lb 12.8 oz (141.9 kg)     GEN: No acute distress  NECK: No JVD; No carotid bruits CARDIAC: Regular rate and rhythm, no murmurs, rubs, gallops RESPIRATORY:  Clear to auscultation without rales, wheezing or rhonchi  ABDOMEN: Soft, non-tender, non-distended EXTREMITIES:  No edema; No deformity   ASSESSMENT AND PLAN:    Chronic systolic CHF  s/p Medtronic CRT-D  euvolemic today Stable on an appropriate medical regimen Normal ICD function See Pace Art report No changes today Device with ~2 months to ERI. We discussed risks and benefits of generator change including bleeding and infection, and this can be scheduled when it occurs.   Parox AF EKG today shows stable  intervals on sotalol  80 mg BID Continue Xarelto  for CHA2DS2VASc of at least 3 Amio previously stopped with thyrotoxicity  H/o VT  Managed on Sotalol   HTN Stable on current regimen   Disposition:   Follow up with Dr. Lawana Pray as usual post procedure   Signed, Tylene Galla, [name]

## 2024-02-11 NOTE — Patient Instructions (Signed)
 Medication Instructions:  Your physician recommends that you continue on your current medications as directed. Please refer to the Current Medication list given to you today.  *If you need a refill on your cardiac medications before your next appointment, please call your pharmacy*  Lab Work: None ordered If you have labs (blood work) drawn today and your tests are completely normal, you will receive your results only by: MyChart Message (if you have MyChart) OR A paper copy in the mail If you have any lab test that is abnormal or we need to change your treatment, we will call you to review the results.  Follow-Up: At [facility], you and your health needs are our priority.  As part of our continuing mission to provide you with exceptional heart care, our providers are all part of one team.  This team includes your primary Cardiologist (physician) and Advanced Practice Providers or APPs (Physician Assistants and Nurse Practitioners) who all work together to provide you with the care you need, when you need it.  Your next appointment:   6 month(s)  Provider:   Bambi Lever "Michaelle Adolphus, [name]

## 2024-02-12 LAB — CUP PACEART REMOTE DEVICE CHECK
Battery Remaining Longevity: 2 mo
Battery Voltage: 2.79 V
Brady Statistic AP VP Percent: 0.35 %
Brady Statistic AP VS Percent: 0.02 %
Brady Statistic AS VP Percent: 98.08 %
Brady Statistic AS VS Percent: 1.55 %
Brady Statistic RA Percent Paced: 0.37 %
Brady Statistic RV Percent Paced: 5.01 %
Date Time Interrogation Session: 20250506115229
HighPow Impedance: 75 Ohm
Implantable Lead Connection Status: 753985
Implantable Lead Connection Status: 753985
Implantable Lead Connection Status: 753985
Implantable Lead Implant Date: 20090901
Implantable Lead Implant Date: 20170210
Implantable Lead Implant Date: 20170210
Implantable Lead Location: 753858
Implantable Lead Location: 753859
Implantable Lead Location: 753860
Implantable Lead Model: 4598
Implantable Lead Model: 5076
Implantable Lead Model: 6935
Implantable Pulse Generator Implant Date: 20170210
Lead Channel Impedance Value: 1083 Ohm
Lead Channel Impedance Value: 1083 Ohm
Lead Channel Impedance Value: 1140 Ohm
Lead Channel Impedance Value: 361 Ohm
Lead Channel Impedance Value: 361 Ohm
Lead Channel Impedance Value: 399 Ohm
Lead Channel Impedance Value: 456 Ohm
Lead Channel Impedance Value: 456 Ohm
Lead Channel Impedance Value: 513 Ohm
Lead Channel Impedance Value: 589 Ohm
Lead Channel Impedance Value: 760 Ohm
Lead Channel Impedance Value: 779 Ohm
Lead Channel Impedance Value: 874 Ohm
Lead Channel Pacing Threshold Amplitude: 0.625 V
Lead Channel Pacing Threshold Amplitude: 1 V
Lead Channel Pacing Threshold Amplitude: 1.875 V
Lead Channel Pacing Threshold Pulse Width: 0.4 ms
Lead Channel Pacing Threshold Pulse Width: 0.4 ms
Lead Channel Pacing Threshold Pulse Width: 0.4 ms
Lead Channel Sensing Intrinsic Amplitude: 1.875 mV
Lead Channel Sensing Intrinsic Amplitude: 1.875 mV
Lead Channel Sensing Intrinsic Amplitude: 11.125 mV
Lead Channel Sensing Intrinsic Amplitude: 11.125 mV
Lead Channel Setting Pacing Amplitude: 1.5 V
Lead Channel Setting Pacing Amplitude: 2.25 V
Lead Channel Setting Pacing Amplitude: 3 V
Lead Channel Setting Pacing Pulse Width: 0.4 ms
Lead Channel Setting Pacing Pulse Width: 0.4 ms
Lead Channel Setting Sensing Sensitivity: 0.3 mV
Zone Setting Status: 755011
Zone Setting Status: 755011

## 2024-02-17 NOTE — Progress Notes (Signed)
 Remote ICD transmission.

## 2024-02-22 ENCOUNTER — Ambulatory Visit: Attending: Cardiology

## 2024-02-22 DIAGNOSIS — Z95810 Presence of automatic (implantable) cardiac defibrillator: Secondary | ICD-10-CM

## 2024-02-22 DIAGNOSIS — I5022 Chronic systolic (congestive) heart failure: Secondary | ICD-10-CM

## 2024-02-24 NOTE — Progress Notes (Signed)
 EPIC Encounter for ICM Monitoring  Patient Name: Gary Hutchinson is a 63 y.o. male Date: 02/24/2024 Primary Care Physican: Margarete Sharps, MD Primary Cardiologist: [name] Electrophysiologist: Lawana Pray Bi-V Pacing: 98.2%    06/19/2023 Weight: 305 lbs 09/16/2023 Weight: 311 lbs 10/23/2023 Weight: 313 lbs 11/12/2023  Weight: 312 lbs 11/25/2023 Weight: 312 lbs 02/09/2024 Weight:  319 lbs 5/212025 Weight: 316 lbs    Time in AT/AF  0.0 hr/day (0.0%) Battery Longevity:  2 months                     Spoke with patient and heart failure questions reviewed.  Transmission results reviewed.  Pt is asymptomatic and doing well at this time.   Diet: Attempting to limit salt intake   Optivol thoracic impedance suggesting normal fluid levels since 5/4.       Prescribed dosage:  Torsemide  20 mg Take 2 tablets (40 mg total) by mouth every morning and 1 tablet (20 total) at bedtime.  He reports Dr [name] has approved that he can take extra Torsemide  or Metolazone  when needed.  Potassium 20 mEq take 2 tablets (40 mEq total) by mouth as directed.  Please take 40 mEq when you take your Metolazone .  Metolazone  2.5 mg 1 tablet as needed (for edema).   Spironolactone  25 mg take 0.5 tablet (12.5 mg total) by mouth daily   Labs:   01/21/2024 Creatinine 1.79, BUN 65, Potassium 3.8, Sodium 136, eGFR 42 11/12/2023 Creatinine 2.20, BUN 69, Potassium 4.5, Sodium 136, eGFR 33 07/21/2023 Creatinine 2.00, BUN 54, Potassium 4.4, Sodium 141, eGFR 37 04/27/2023 Creatinine 1.80, BUN 39, Potassium 3.9, Sodium 137, eGFR 42 03/09/2023 Creatinine 2.02, BUN 89, Potassium 3.4, Sodium 138  01/19/2023 Creatinine 1.88, BUN 53, Potassium 4.1, Sodium 141, eGFR 40 10/27/2022 Creatinine 1.82, BUN 54, Potassium 4.1, Sodium 134, eGFR 42 10/17/2022 Creatinine 1.90, BUN 47, Potassium 4.3, Sodium 141 A complete set of results can be found in Results Review.   Recommendations: No changes and encouraged to call if experiencing any fluid  symptoms.   Follow-up plan: ICM clinic phone appointment on 03/14/2024.   91 day device clinic remote transmission 05/06/2024.     EP/Cardiology Office Visits:  05/11/2024 with HF clinic.  Recall 08/09/2024 with Michaelle Adolphus, PA.     Copy of ICM check sent to Dr. Lawana Pray.    3 month ICM trend: 02/22/2024.    12-14 Month ICM trend:     Almyra Jain, RN 02/24/2024 8:49 AM

## 2024-02-28 ENCOUNTER — Other Ambulatory Visit (HOSPITAL_COMMUNITY): Payer: Self-pay | Admitting: Internal Medicine

## 2024-03-07 ENCOUNTER — Ambulatory Visit (INDEPENDENT_AMBULATORY_CARE_PROVIDER_SITE_OTHER)

## 2024-03-07 DIAGNOSIS — I428 Other cardiomyopathies: Secondary | ICD-10-CM

## 2024-03-07 DIAGNOSIS — I5022 Chronic systolic (congestive) heart failure: Secondary | ICD-10-CM

## 2024-03-07 LAB — CUP PACEART REMOTE DEVICE CHECK
Battery Remaining Longevity: 1 mo
Battery Voltage: 2.77 V
Brady Statistic AP VP Percent: 0.31 %
Brady Statistic AP VS Percent: 0.01 %
Brady Statistic AS VP Percent: 78.08 %
Brady Statistic AS VS Percent: 21.6 %
Brady Statistic RA Percent Paced: 0.32 %
Brady Statistic RV Percent Paced: 28.05 %
Date Time Interrogation Session: 20250602043725
HighPow Impedance: 103 Ohm
Implantable Lead Connection Status: 753985
Implantable Lead Connection Status: 753985
Implantable Lead Connection Status: 753985
Implantable Lead Implant Date: 20090901
Implantable Lead Implant Date: 20170210
Implantable Lead Implant Date: 20170210
Implantable Lead Location: 753858
Implantable Lead Location: 753859
Implantable Lead Location: 753860
Implantable Lead Model: 4598
Implantable Lead Model: 5076
Implantable Lead Model: 6935
Implantable Pulse Generator Implant Date: 20170210
Lead Channel Impedance Value: 1026 Ohm
Lead Channel Impedance Value: 1064 Ohm
Lead Channel Impedance Value: 1064 Ohm
Lead Channel Impedance Value: 361 Ohm
Lead Channel Impedance Value: 361 Ohm
Lead Channel Impedance Value: 456 Ohm
Lead Channel Impedance Value: 456 Ohm
Lead Channel Impedance Value: 475 Ohm
Lead Channel Impedance Value: 532 Ohm
Lead Channel Impedance Value: 589 Ohm
Lead Channel Impedance Value: 665 Ohm
Lead Channel Impedance Value: 817 Ohm
Lead Channel Impedance Value: 874 Ohm
Lead Channel Pacing Threshold Amplitude: 0.625 V
Lead Channel Pacing Threshold Amplitude: 0.75 V
Lead Channel Pacing Threshold Amplitude: 1.5 V
Lead Channel Pacing Threshold Pulse Width: 0.4 ms
Lead Channel Pacing Threshold Pulse Width: 0.4 ms
Lead Channel Pacing Threshold Pulse Width: 0.4 ms
Lead Channel Sensing Intrinsic Amplitude: 1.25 mV
Lead Channel Sensing Intrinsic Amplitude: 1.25 mV
Lead Channel Sensing Intrinsic Amplitude: 11.125 mV
Lead Channel Sensing Intrinsic Amplitude: 11.125 mV
Lead Channel Setting Pacing Amplitude: 1.5 V
Lead Channel Setting Pacing Amplitude: 2 V
Lead Channel Setting Pacing Amplitude: 2.5 V
Lead Channel Setting Pacing Pulse Width: 0.4 ms
Lead Channel Setting Pacing Pulse Width: 0.4 ms
Lead Channel Setting Sensing Sensitivity: 0.3 mV
Zone Setting Status: 755011
Zone Setting Status: 755011

## 2024-03-08 ENCOUNTER — Telehealth: Payer: Self-pay

## 2024-03-08 NOTE — Telephone Encounter (Signed)
 Discussed with patient regarding AF clinic referral. Patient is agreeable to plan. Advised to someone will contact him for apt info. Pt voiced understanding/appreciative of c/b.

## 2024-03-08 NOTE — Telephone Encounter (Signed)
 Alert received from [employer] for presenting AFL. 1 AHR detection, burden 44.8%, current episode in progress since 02/29/24. Known history of AFL, on Xarelto  per Epic. Decrease in BIV pacing to 78.9%, AFL likely contributory. Routing to triage for increase in AF burden from previous, ongoing persistent AF episode for 7 days, and decreased BIV pacing per protocol.  Had recent OV with [place], [place] 02/11/24.   Patient is asymptomatic. Compliant with medications and no fluid overload symptoms.   Jonelle Neri, routing to see if you wanted to advise further since recent OV or send referral to AF clinic.

## 2024-03-14 ENCOUNTER — Ambulatory Visit: Attending: Cardiology

## 2024-03-14 DIAGNOSIS — Z95810 Presence of automatic (implantable) cardiac defibrillator: Secondary | ICD-10-CM

## 2024-03-14 DIAGNOSIS — I5022 Chronic systolic (congestive) heart failure: Secondary | ICD-10-CM

## 2024-03-16 ENCOUNTER — Ambulatory Visit (HOSPITAL_COMMUNITY)
Admission: RE | Admit: 2024-03-16 | Discharge: 2024-03-16 | Disposition: A | Discharge status: Home / Self Care | Source: Ambulatory Visit | Attending: Internal Medicine | Admitting: Internal Medicine

## 2024-03-16 VITALS — BP 104/76 | HR 81 | Ht 65.0 in | Wt 318.6 lb

## 2024-03-16 DIAGNOSIS — I4891 Unspecified atrial fibrillation: Secondary | ICD-10-CM | POA: Diagnosis not present

## 2024-03-16 DIAGNOSIS — Z5181 Encounter for therapeutic drug level monitoring: Secondary | ICD-10-CM | POA: Diagnosis not present

## 2024-03-16 DIAGNOSIS — D6869 Other thrombophilia: Secondary | ICD-10-CM

## 2024-03-16 DIAGNOSIS — I480 Paroxysmal atrial fibrillation: Secondary | ICD-10-CM | POA: Diagnosis not present

## 2024-03-16 DIAGNOSIS — Z79899 Other long term (current) drug therapy: Secondary | ICD-10-CM

## 2024-03-16 NOTE — Progress Notes (Addendum)
 Primary Care Physician: Margarete Sharps, MD Referring Physician: Dr. Thersa Flavors Kope is a 63 y.o. male with a h/o chronic systolic heart failure, s/p MDT ICD, HTN, atrial fibrillation, DM, OSA on Cpap, hx of amio thyrotoxicity, obesity, that is in the afib clinic for f/u recent ER visit.Gary Hutchinson He received a shock from his ICD after a day full of activities, over 7000 steps. He was found to have rapid afib with RVR which triggered the shock. Dr. Nunzio Belch felt antiarrythmic therapy was indicated and started 80 mg sotalol  bid and he was discharged home.   On evaluation today, he is currently in AV paced rhythm. Device clinic alert on 6/3 for Afib ongoing since 5/26. He notes that likely dietary indiscretion was a contributing factor. No missed doses of sotalol  80 mg BID or Xarelto  20 mg daily.   Today, he denies symptoms of palpitations, chest pain, shortness of breath, orthopnea, PND, lower extremity edema, dizziness, presyncope, syncope, or neurologic sequela. The patient is tolerating medications without difficulties and is otherwise without complaint today.   Past Medical History:  Diagnosis Date   AICD (automatic cardioverter/defibrillator) present    Medtronic   Alcohol abuse    now quit   Anemia    iron defi   Arthritis    fingers, knees, some days all my joints ache (11/16/2015)   Asthma    Cholecystitis    Chronic systolic congestive heart failure (HCC)    a. RHC (02/2011) RA 31/27, RV 71/27, PA67/45, PCWP 46, PA 49%, Fick CO: 3.4 b. ECHO (03/2014) EF 30-35%, grade II DD, mild MR, RV poorly visualized appears mildly decreased c. RHC (05/2014) RA 13, RV 54/4/11, PA 60/22 (37), PCWP 17, Fick CO/CI: 4.4 / 1.9, Thermo CO/CI: 3.8 /1.6, PVR 5.2 WU, PA 57% and 59%   Diverticulosis of colon    Gout    Hemorrhoids, internal    Hyperlipidemia    Hypertension    Morbid obesity (HCC)    Nonischemic cardiomyopathy (HCC)    a. LHC (02/2011) normal coronaries   OSA on CPAP    Paroxysmal atrial  fibrillation (HCC)    Pneumonia 2000s X 1   Pulmonary hypertension (HCC)    Renal insufficiency    Type II diabetes mellitus (HCC)    Ventricular tachycardia [facility])    s/p MDT ICD implant   Past Surgical History:  Procedure Laterality Date   CARDIAC CATHETERIZATION  03/08/2004   EF 25-30%   CARDIOVERSION N/A 10/17/2015   Procedure: CARDIOVERSION;  Surgeon: Lake Pilgrim, MD;  Location: [facility] ENDOSCOPY;  Service: Cardiovascular;  Laterality: N/A;   EP IMPLANTABLE DEVICE N/A 11/16/2015   Procedure: BiVI Upgrade;  Surgeon: Jolly Needle, MD;  Medtronic Viva Quad XT   INSERTION OF ICD  06/2008   ICD- Medtronic    RIGHT HEART CATHETERIZATION N/A 05/23/2014   Procedure: RIGHT HEART CATH;  Surgeon: Mardell Shade, MD;  Location: [facility] CATH LAB;  Service: Cardiovascular;  Laterality: N/A;   TRANSTHORACIC ECHOCARDIOGRAM  05/27/2008   EF 30-35%   US  ECHOCARDIOGRAPHY  08/22/2008   EF 30-35%    Current Outpatient Medications  Medication Sig Dispense Refill   acetaminophen  (TYLENOL ) 500 MG tablet Take 500 mg by mouth every 6 (six) hours as needed for moderate pain or headache.      albuterol  (VENTOLIN  HFA) 108 (90 Base) MCG/ACT inhaler Inhale 2 puffs into the lungs every 6 (six) hours as needed for wheezing or shortness of breath.  allopurinol  (ZYLOPRIM ) 100 MG tablet Take 100 mg by mouth daily.     atorvastatin  (LIPITOR) 80 MG tablet Take 80 mg by mouth at bedtime.     colchicine  0.6 MG tablet Take 0.6 mg by mouth daily as needed (for gout flares).      dapagliflozin propanediol (FARXIGA) 10 MG TABS tablet Take 10 mg by mouth daily.     digoxin  (LANOXIN ) 0.125 MG tablet Take 0.5 tablets by mouth every other day.     digoxin  (LANOXIN ) 0.25 MG tablet TAKE 1/2 TABLET BY MOUTH EVERY OTHER DAY 45 tablet 0   ENTRESTO  97-103 MG TAKE 1 TABLET BY MOUTH TWICE A DAY 180 tablet 3   Fluticasone -Salmeterol (ADVAIR) 250-50 MCG/DOSE AEPB Inhale 1 puff into the lungs every 12 (twelve) hours as needed (for  flares/shortness of breath). Reported on 02/06/2016     hydrALAZINE  (APRESOLINE ) 25 MG tablet Take 1 tablet (25 mg total) by mouth 3 (three) times daily. 90 tablet 3   insulin  lispro (HUMALOG) 100 UNIT/ML injection Inject 10 Units into the skin See admin instructions. 4 to 5 times daily with a meal, depending on how many meals are eaten that day.     isosorbide  mononitrate (IMDUR ) 30 MG 24 hr tablet TAKE 1 TABLET (30 MG TOTAL) BY MOUTH IN THE MORNING AND AT BEDTIME. 180 tablet 3   metolazone  (ZAROXOLYN ) 2.5 MG tablet Take 2.5 mg by mouth daily as needed (for edema).     montelukast  (SINGULAIR ) 10 MG tablet Take 10 mg by mouth as needed (allergies).     ondansetron  (ZOFRAN -ODT) 8 MG disintegrating tablet Take 1 tablet (8 mg total) by mouth every 8 (eight) hours as needed for nausea or vomiting. 20 tablet 0   OZEMPIC, 1 MG/DOSE, 4 MG/3ML SOPN Inject 1 mg into the skin once a week.     potassium chloride  SA (KLOR-CON  M) 20 MEQ tablet Take 2 tablets (40 mEq total) by mouth as directed. Please take 40 meq when you take your Metolazone  60 tablet 6   sotalol  (BETAPACE ) 80 MG tablet TAKE 1 TABLET BY MOUTH 2 TIMES DAILY. 180 tablet 2   spironolactone  (ALDACTONE ) 25 MG tablet Take 12.5 mg by mouth daily.      torsemide  (DEMADEX ) 20 MG tablet TAKE 2 TABLETS (40 MG TOTAL) BY MOUTH EVERY MORNING AND 1 TABLET (20 MG TOTAL) AT BEDTIME. 270 tablet 3   TRESIBA FLEXTOUCH 200 UNIT/ML FlexTouch Pen Inject 48 Units into the skin at bedtime.     XARELTO  20 MG TABS tablet TAKE 1 TABLET BY MOUTH DAILY WITH SUPPER 90 tablet 3   No current facility-administered medications for this encounter.    No Known Allergies  ROS- All systems are reviewed and negative except as per the HPI above  Physical Exam: Vitals:   03/16/24 1013  BP: 104/76  Pulse: 81  Weight: (!) 144.5 kg  Height: 5' 5 (1.651 m)    Wt Readings from Last 3 Encounters:  03/16/24 (!) 144.5 kg  02/11/24 (!) 142.9 kg  01/21/24 (!) 143.8 kg     Labs: Lab Results  Component Value Date   NA 136 01/21/2024   K 3.8 01/21/2024   CL 102 01/21/2024   CO2 22 01/21/2024   GLUCOSE 222 (H) 01/21/2024   BUN 65 (H) 01/21/2024   CREATININE 1.79 (H) 01/21/2024   CALCIUM  8.8 (L) 01/21/2024   PHOS 4.1 03/04/2013   MG 3.6 (H) 11/12/2023   Lab Results  Component Value Date   INR  1.72 01/20/2018   Lab Results  Component Value Date   CHOL 181 04/09/2011   HDL 37 (L) 04/09/2011   LDLCALC 117 (H) 04/09/2011   TRIG 137 04/09/2011   GEN- The patient is well appearing, alert and oriented x 3 today.   Neck - no JVD or carotid bruit noted Lungs- Clear to ausculation bilaterally, normal work of breathing Heart- Regular rate and rhythm, no murmurs, rubs or gallops, PMI not laterally displaced Extremities- no clubbing, cyanosis, or edema Skin - no rash or ecchymosis noted   EKG-  Vent. rate 81 BPM PR interval 168 ms QRS duration 98 ms QT/QTcB 388/450 ms P-R-T axes 77 193 19 Atrial-sensed ventricular-paced rhythm Abnormal ECG When compared with ECG of 21-Jan-2024 08:40, PREVIOUS ECG IS PRESENT  ECHO 11/12/23: 1. Left ventricular ejection fraction, by estimation, is 30 to 35%. The  left ventricle has moderately decreased function. The left ventricle  demonstrates global hypokinesis. The left ventricular internal cavity size  was moderately dilated. Left  ventricular diastolic parameters are indeterminate.   2. Right ventricular systolic function was not well visualized. The right  ventricular size is not well visualized. Tricuspid regurgitation signal is  inadequate for assessing PA pressure.   3. Left atrial size was moderately dilated.   4. The mitral valve is normal in structure. Trivial mitral valve  regurgitation.   5. The aortic valve is tricuspid. Aortic valve regurgitation is not  visualized. No aortic stenosis is present.   CHA2DS2-VASc Score = 4  The patient's score is based upon: CHF History: 1 HTN History:  1 Diabetes History: 1 Stroke History: 0 Vascular Disease History: 1 Age Score: 0 Gender Score: 0       ASSESSMENT AND PLAN: Paroxysmal Atrial Fibrillation (ICD10:  I48.19) The patient's CHA2DS2-VASc score is 4, indicating a 4.8% annual risk of stroke.    He is currently in AV paced rhythm.    High risk medication monitoring (ICD10: [id number]) Patient requires ongoing monitoring for anti-arrhythmic medication which has the potential to cause life threatening arrhythmias or AV block. Qtc stable. Continue sotalol  80 mg BID. Bmet and mag drawn today.  Chronic systolic heart failure Appears euvolemic today  Secondary Hypercoagulable State (ICD10:  D68.69) The patient is at significant risk for stroke/thromboembolism based upon his CHA2DS2-VASc Score of 4.  Continue Rivaroxaban  (Xarelto ).  No missed doses.     Follow up 6 months for sotalol  surveillance.   Woody Heading, [name] [facility] [address] [place], [place] [address] [phone]

## 2024-03-16 NOTE — Progress Notes (Signed)
 EPIC Encounter for ICM Monitoring  Patient Name: Gary Hutchinson is a 63 y.o. male Date: 03/16/2024 Primary Care Physican: Margarete Sharps, MD Primary Cardiologist: [name] Electrophysiologist: Lawana Pray Bi-V Pacing: 75% (decreased from 98.2% on last ICM report)   06/19/2023 Weight: 305 lbs 09/16/2023 Weight: 311 lbs 10/23/2023 Weight: 313 lbs 11/12/2023  Weight: 312 lbs 11/25/2023 Weight: 312 lbs 02/09/2024 Weight:  319 lbs 5/212025 Weight: 316 lbs  03/16/2024 Office Weight: 318 lbs   Time in AT/AF  13.9 hr/day (58.0%) Longest AT/AF 10 days  Battery Longevity:  1 month                     Spoke with patient and heart failure questions reviewed.  Transmission results reviewed.  Pt is asymptomatic and feeling okay.  He has been traveling some in the past month.  Pt seen in Afib clinic 6/11 for increase in AT/AF burden.   Diet: Attempting to limit salt intake   Optivol thoracic impedance suggesting pattern of days with possible fluid accumulation alternating returning to baseline for a few days in the last month.       Prescribed dosage:  Torsemide  20 mg Take 2 tablets (40 mg total) by mouth every morning and 1 tablet (20 total) at bedtime.  He reports Dr [name] has approved that he can take extra Torsemide  or Metolazone  when needed.  Potassium 20 mEq take 2 tablets (40 mEq total) by mouth as directed.  Please take 40 mEq when you take your Metolazone .  Metolazone  2.5 mg 1 tablet as needed (for edema).   Spironolactone  25 mg take 0.5 tablet (12.5 mg total) by mouth daily   Labs:   01/21/2024 Creatinine 1.79, BUN 65, Potassium 3.8, Sodium 136, eGFR 42 11/12/2023 Creatinine 2.20, BUN 69, Potassium 4.5, Sodium 136, eGFR 33 07/21/2023 Creatinine 2.00, BUN 54, Potassium 4.4, Sodium 141, eGFR 37 04/27/2023 Creatinine 1.80, BUN 39, Potassium 3.9, Sodium 137, eGFR 42 03/09/2023 Creatinine 2.02, BUN 89, Potassium 3.4, Sodium 138  01/19/2023 Creatinine 1.88, BUN 53, Potassium 4.1, Sodium 141, eGFR  40 10/27/2022 Creatinine 1.82, BUN 54, Potassium 4.1, Sodium 134, eGFR 42 10/17/2022 Creatinine 1.90, BUN 47, Potassium 4.3, Sodium 141 A complete set of results can be found in Results Review.   Recommendations:   No changes and encouraged to call if experiencing any fluid symptoms.   Follow-up plan: ICM clinic phone appointment on 04/25/2024.   91 day device clinic remote transmission 05/06/2024.     EP/Cardiology Office Visits:  05/11/2024 with HF clinic.  Recall 08/09/2024 with Michaelle Adolphus, PA.     Copy of ICM check sent to Dr. Lawana Pray.    3 month ICM trend: 03/14/2024.    12-14 Month ICM trend:     Almyra Jain, RN 03/16/2024 3:12 PM

## 2024-03-17 ENCOUNTER — Ambulatory Visit: Payer: Self-pay | Admitting: Cardiology

## 2024-03-17 ENCOUNTER — Ambulatory Visit (HOSPITAL_COMMUNITY): Payer: Self-pay | Admitting: Internal Medicine

## 2024-03-17 LAB — BASIC METABOLIC PANEL WITH GFR
BUN/Creatinine Ratio: 30 — ABNORMAL HIGH (ref 10–24)
BUN: 56 mg/dL — ABNORMAL HIGH (ref 8–27)
CO2: 20 mmol/L (ref 20–29)
Calcium: 9.2 mg/dL (ref 8.6–10.2)
Chloride: 103 mmol/L (ref 96–106)
Creatinine, Ser: 1.87 mg/dL — ABNORMAL HIGH (ref 0.76–1.27)
Glucose: 173 mg/dL — ABNORMAL HIGH (ref 70–99)
Potassium: 4.3 mmol/L (ref 3.5–5.2)
Sodium: 142 mmol/L (ref 134–144)
eGFR: 40 mL/min/{1.73_m2} — ABNORMAL LOW (ref >59)

## 2024-03-17 LAB — MAGNESIUM: Magnesium: 3 mg/dL — ABNORMAL HIGH (ref 1.6–2.3)

## 2024-03-17 NOTE — Progress Notes (Signed)
 Remote ICD transmission.

## 2024-03-17 NOTE — Addendum Note (Signed)
 Addended by: Lott Rouleau A on: 03/17/2024 09:41 AM   Modules accepted: Orders

## 2024-04-07 ENCOUNTER — Ambulatory Visit (INDEPENDENT_AMBULATORY_CARE_PROVIDER_SITE_OTHER)

## 2024-04-07 ENCOUNTER — Other Ambulatory Visit (HOSPITAL_COMMUNITY): Payer: Self-pay | Admitting: Family Medicine

## 2024-04-07 DIAGNOSIS — I428 Other cardiomyopathies: Secondary | ICD-10-CM

## 2024-04-07 LAB — CUP PACEART REMOTE DEVICE CHECK
Battery Remaining Longevity: 1 mo
Battery Voltage: 2.73 V
Brady Statistic AP VP Percent: 0.49 %
Brady Statistic AP VS Percent: 0.01 %
Brady Statistic AS VP Percent: 56.55 %
Brady Statistic AS VS Percent: 42.96 %
Brady Statistic RA Percent Paced: 0.49 %
Brady Statistic RV Percent Paced: 44.36 %
Date Time Interrogation Session: 20250703022825
HighPow Impedance: 79 Ohm
Implantable Lead Connection Status: 753985
Implantable Lead Connection Status: 753985
Implantable Lead Connection Status: 753985
Implantable Lead Implant Date: 20090901
Implantable Lead Implant Date: 20170210
Implantable Lead Implant Date: 20170210
Implantable Lead Location: 753858
Implantable Lead Location: 753859
Implantable Lead Location: 753860
Implantable Lead Model: 4598
Implantable Lead Model: 5076
Implantable Lead Model: 6935
Implantable Pulse Generator Implant Date: 20170210
Lead Channel Impedance Value: 1007 Ohm
Lead Channel Impedance Value: 342 Ohm
Lead Channel Impedance Value: 361 Ohm
Lead Channel Impedance Value: 418 Ohm
Lead Channel Impedance Value: 456 Ohm
Lead Channel Impedance Value: 475 Ohm
Lead Channel Impedance Value: 513 Ohm
Lead Channel Impedance Value: 589 Ohm
Lead Channel Impedance Value: 608 Ohm
Lead Channel Impedance Value: 779 Ohm
Lead Channel Impedance Value: 836 Ohm
Lead Channel Impedance Value: 931 Ohm
Lead Channel Impedance Value: 988 Ohm
Lead Channel Pacing Threshold Amplitude: 0.625 V
Lead Channel Pacing Threshold Amplitude: 0.75 V
Lead Channel Pacing Threshold Amplitude: 1.375 V
Lead Channel Pacing Threshold Pulse Width: 0.4 ms
Lead Channel Pacing Threshold Pulse Width: 0.4 ms
Lead Channel Pacing Threshold Pulse Width: 0.4 ms
Lead Channel Sensing Intrinsic Amplitude: 1 mV
Lead Channel Sensing Intrinsic Amplitude: 1 mV
Lead Channel Sensing Intrinsic Amplitude: 13.875 mV
Lead Channel Sensing Intrinsic Amplitude: 13.875 mV
Lead Channel Setting Pacing Amplitude: 1.5 V
Lead Channel Setting Pacing Amplitude: 2 V
Lead Channel Setting Pacing Amplitude: 2.5 V
Lead Channel Setting Pacing Pulse Width: 0.4 ms
Lead Channel Setting Pacing Pulse Width: 0.4 ms
Lead Channel Setting Sensing Sensitivity: 0.3 mV
Zone Setting Status: 755011
Zone Setting Status: 755011

## 2024-04-11 ENCOUNTER — Telehealth: Payer: Self-pay

## 2024-04-11 NOTE — Telephone Encounter (Signed)
 Appt made

## 2024-04-11 NOTE — Telephone Encounter (Signed)
 Alert received from [employer]:  Ongoing persistent AF since 03/17/24, V rates > 100 bpm ~ 10%, on Xarelto  per Epic; routed to Triage for persistent and ongoing AF.  Total VP 57.9% since 03/14/24, trends illustrate a significant decrease correlating with AF  Discussed with AF clinic.  Will forward to AF clinic to schedule follow up to discussion cardioversion.

## 2024-04-12 ENCOUNTER — Other Ambulatory Visit (HOSPITAL_COMMUNITY): Payer: Self-pay | Admitting: Internal Medicine

## 2024-04-18 ENCOUNTER — Ambulatory Visit (HOSPITAL_COMMUNITY): Admitting: Internal Medicine

## 2024-04-18 ENCOUNTER — Other Ambulatory Visit (HOSPITAL_COMMUNITY): Payer: Self-pay

## 2024-04-18 ENCOUNTER — Other Ambulatory Visit: Payer: Self-pay

## 2024-04-18 MED ORDER — SOTALOL HCL 80 MG PO TABS
80.0000 mg | ORAL_TABLET | Freq: Two times a day (BID) | ORAL | 2 refills | Status: DC
  Filled 2024-04-18: qty 180, 90d supply, fill #0

## 2024-04-20 ENCOUNTER — Other Ambulatory Visit: Payer: Self-pay

## 2024-04-20 ENCOUNTER — Other Ambulatory Visit (HOSPITAL_COMMUNITY): Payer: Self-pay

## 2024-04-20 MED ORDER — SOTALOL HCL 80 MG PO TABS: 80.0000 mg | ORAL_TABLET | Freq: Two times a day (BID) | ORAL | 2 refills | Status: AC

## 2024-04-22 ENCOUNTER — Ambulatory Visit (HOSPITAL_COMMUNITY): Payer: Self-pay | Admitting: Internal Medicine

## 2024-04-22 ENCOUNTER — Ambulatory Visit (HOSPITAL_COMMUNITY)
Admission: RE | Admit: 2024-04-22 | Discharge: 2024-04-22 | Disposition: A | Discharge status: Home / Self Care | Source: Ambulatory Visit | Attending: Internal Medicine | Admitting: Internal Medicine

## 2024-04-22 VITALS — BP 84/68 | HR 69 | Ht 65.0 in | Wt 315.2 lb

## 2024-04-22 DIAGNOSIS — I4891 Unspecified atrial fibrillation: Secondary | ICD-10-CM | POA: Diagnosis not present

## 2024-04-22 DIAGNOSIS — Z5181 Encounter for therapeutic drug level monitoring: Secondary | ICD-10-CM

## 2024-04-22 DIAGNOSIS — I4819 Other persistent atrial fibrillation: Secondary | ICD-10-CM

## 2024-04-22 DIAGNOSIS — D6869 Other thrombophilia: Secondary | ICD-10-CM | POA: Diagnosis not present

## 2024-04-22 DIAGNOSIS — Z79899 Other long term (current) drug therapy: Secondary | ICD-10-CM

## 2024-04-22 LAB — BASIC METABOLIC PANEL WITH GFR
BUN/Creatinine Ratio: 22 (ref 10–24)
BUN: 44 mg/dL — ABNORMAL HIGH (ref 8–27)
CO2: 25 mmol/L (ref 20–29)
Calcium: 9.7 mg/dL (ref 8.6–10.2)
Chloride: 98 mmol/L (ref 96–106)
Creatinine, Ser: 2.02 mg/dL — ABNORMAL HIGH (ref 0.76–1.27)
Glucose: 207 mg/dL — ABNORMAL HIGH (ref 70–99)
Potassium: 4.6 mmol/L (ref 3.5–5.2)
Sodium: 136 mmol/L (ref 134–144)
eGFR: 36 mL/min/1.73 — ABNORMAL LOW (ref >59)

## 2024-04-22 LAB — CBC
Hematocrit: 40.5 % (ref 37.5–51.0)
Hemoglobin: 12.5 g/dL — ABNORMAL LOW (ref 13.0–17.7)
MCH: 25 pg — ABNORMAL LOW (ref 26.6–33.0)
MCHC: 30.9 g/dL — ABNORMAL LOW (ref 31.5–35.7)
MCV: 81 fL (ref 79–97)
Platelets: 201 x10E3/uL (ref 150–450)
RBC: 5 x10E6/uL (ref 4.14–5.80)
RDW: 16.8 % — ABNORMAL HIGH (ref 11.6–15.4)
WBC: 6.5 x10E3/uL (ref 3.4–10.8)

## 2024-04-22 NOTE — Patient Instructions (Addendum)
 Hold Farxiga after 7/21 Monday   Half dose of night insulin  7/24 Thursday   Hold Ozempic dose Sunday 7/20   May resume these meds after day of procedure   Cardioversion scheduled for: Friday 7/25   - Arrive at the [facility] ([address]  and check in with ADMITTING at 9.30 am    - Do not eat or drink anything after midnight the night prior to your procedure.   - Take all your morning medication (except diabetic medications) with a sip of water prior to arrival.  - Do NOT miss any doses of your blood thinner - if you should miss a dose or take a dose more than 4 hours late -- please notify our office immediately.  - You will not be able to drive home after your procedure. Please ensure you have a responsible adult to drive you home. You will need someone with you for 24 hours post procedure.     - Expect to be in the procedural area approximately 2 hours.   - If you feel as if you go back into normal rhythm prior to scheduled cardioversion, please notify our office immediately.   If your procedure is canceled in the cardioversion suite you will be charged a cancellation fee.    Hold below medications 7 days prior to scheduled procedure/anesthesia.  Restart medication on the normal dosing day after scheduled procedure/anesthesia  Semaglutide (Ozempic) [facility])    Hold below medications 72 hours prior to scheduled procedure/anesthesia. Restart medication on the following day after scheduled procedure/anesthesia Dapagliflozin Pauletta)    Hold below medications 24 hours prior to scheduled procedure/anesthesia.   Restart medication on the following day after scheduled procedure/anesthesia   Exenatide (Byetta)  Liraglutide (Victoza, Saxenda)  Lixisenatide (Adlyxin)  Semaglutide (Rybelsus) Polyethylene Glycol Loxenatide   For those patients who have a scheduled procedure/anesthesia on the same day of the week as their dose, hold  the medication on the day of surgery.  They can take their scheduled dose the week before.  **Patients on the above medications scheduled for elective procedures that have not held the medication for the appropriate amount of time are at risk of cancellation or change in the anesthetic plan.

## 2024-04-22 NOTE — H&P (View-Only) (Signed)
 Primary Care Physician: Onita Rush, MD Referring Physician: Dr. Kelsie Missy Gary Hutchinson is a 62 y.o. male with a h/o chronic systolic heart failure, s/p MDT ICD, HTN, atrial fibrillation, DM, OSA on Cpap, hx of amio thyrotoxicity, obesity, that is in the afib clinic for f/u recent ER visit.[name] He received a shock from his ICD after a day full of activities, over 7000 steps. He was found to have rapid afib with RVR which triggered the shock. Dr. Kelsie felt antiarrythmic therapy was indicated and started 80 mg sotalol  bid and he was discharged home.   On evaluation today, he is currently in AV paced rhythm. Device clinic alert on 6/3 for Afib ongoing since 5/26. He notes that likely dietary indiscretion was a contributing factor. No missed doses of sotalol  80 mg BID or Xarelto  20 mg daily.   On follow up 04/22/24, he is currently in V paced rhythm. Device alert on 7/7 for ongoing Afib since 6/12. No missed doses of sotalol  or Xarelto .   Today, he denies symptoms of palpitations, chest pain, shortness of breath, orthopnea, PND, lower extremity edema, dizziness, presyncope, syncope, or neurologic sequela. The patient is tolerating medications without difficulties and is otherwise without complaint today.   Past Medical History:  Diagnosis Date   AICD (automatic cardioverter/defibrillator) present    Medtronic   Alcohol abuse    now quit   Anemia    iron defi   Arthritis    fingers, knees, some days all my joints ache (11/16/2015)   Asthma    Cholecystitis    Chronic systolic congestive heart failure (HCC)    a. RHC (02/2011) RA 31/27, RV 71/27, PA67/45, PCWP 46, PA 49%, Fick CO: 3.4 b. ECHO (03/2014) EF 30-35%, grade II DD, mild MR, RV poorly visualized appears mildly decreased c. RHC (05/2014) RA 13, RV 54/4/11, PA 60/22 (37), PCWP 17, Fick CO/CI: 4.4 / 1.9, Thermo CO/CI: 3.8 /1.6, PVR 5.2 WU, PA 57% and 59%   Diverticulosis of colon    Gout    Hemorrhoids, internal    Hyperlipidemia     Hypertension    Morbid obesity (HCC)    Nonischemic cardiomyopathy (HCC)    a. LHC (02/2011) normal coronaries   OSA on CPAP    Paroxysmal atrial fibrillation (HCC)    Pneumonia 2000s X 1   Pulmonary hypertension (HCC)    Renal insufficiency    Type II diabetes mellitus (HCC)    Ventricular tachycardia [name])    s/p MDT ICD implant   Past Surgical History:  Procedure Laterality Date   CARDIAC CATHETERIZATION  03/08/2004   EF 25-30%   CARDIOVERSION N/A 10/17/2015   Procedure: CARDIOVERSION;  Surgeon: Aleene JINNY Passe, MD;  Location: [facility] ENDOSCOPY;  Service: Cardiovascular;  Laterality: N/A;   EP IMPLANTABLE DEVICE N/A 11/16/2015   Procedure: BiVI Upgrade;  Surgeon: Lynwood Kelsie, MD;  Medtronic Viva Quad XT   INSERTION OF ICD  06/2008   ICD- Medtronic    RIGHT HEART CATHETERIZATION N/A 05/23/2014   Procedure: RIGHT HEART CATH;  Surgeon: Toribio JONELLE Fuel, MD;  Location: [facility] CATH LAB;  Service: Cardiovascular;  Laterality: N/A;   TRANSTHORACIC ECHOCARDIOGRAM  05/27/2008   EF 30-35%   US  ECHOCARDIOGRAPHY  08/22/2008   EF 30-35%    Current Outpatient Medications  Medication Sig Dispense Refill   acetaminophen  (TYLENOL ) 500 MG tablet Take 500 mg by mouth every 6 (six) hours as needed for moderate pain or headache.  (Patient taking differently: Take  500 mg by mouth as needed for moderate pain (pain score 4-6) or headache.)     albuterol  (VENTOLIN  HFA) 108 (90 Base) MCG/ACT inhaler Inhale 2 puffs into the lungs every 6 (six) hours as needed for wheezing or shortness of breath.     allopurinol  (ZYLOPRIM ) 100 MG tablet Take 100 mg by mouth daily.     atorvastatin  (LIPITOR) 80 MG tablet Take 80 mg by mouth at bedtime.     colchicine  0.6 MG tablet Take 0.6 mg by mouth daily as needed (for gout flares).  (Patient taking differently: Take 0.6 mg by mouth as needed (for gout flares).)     dapagliflozin propanediol (FARXIGA) 10 MG TABS tablet Take 10 mg by mouth daily.     digoxin  (LANOXIN ) 0.25 MG  tablet TAKE 1/2 TABLET BY MOUTH EVERY OTHER DAY 45 tablet 0   ENTRESTO  97-103 MG TAKE 1 TABLET BY MOUTH TWICE A DAY 180 tablet 3   Fluticasone -Salmeterol (ADVAIR) 250-50 MCG/DOSE AEPB Inhale 1 puff into the lungs every 12 (twelve) hours as needed (for flares/shortness of breath). Reported on 02/06/2016     hydrALAZINE  (APRESOLINE ) 25 MG tablet TAKE 1 TABLET BY MOUTH THREE TIMES A DAY 270 tablet 1   insulin  lispro (HUMALOG) 100 UNIT/ML injection Inject 10 Units into the skin See admin instructions. 4 to 5 times daily with a meal, depending on how many meals are eaten that day.     isosorbide  mononitrate (IMDUR ) 30 MG 24 hr tablet TAKE 1 TABLET (30 MG TOTAL) BY MOUTH IN THE MORNING AND AT BEDTIME. 180 tablet 3   metolazone  (ZAROXOLYN ) 2.5 MG tablet Take 2.5 mg by mouth daily as needed (for edema). (Patient taking differently: Take 2.5 mg by mouth as needed (for edema).)     montelukast  (SINGULAIR ) 10 MG tablet Take 10 mg by mouth as needed (allergies).     ondansetron  (ZOFRAN -ODT) 8 MG disintegrating tablet Take 1 tablet (8 mg total) by mouth every 8 (eight) hours as needed for nausea or vomiting. (Patient taking differently: Take 8 mg by mouth as needed for nausea or vomiting.) 20 tablet 0   OZEMPIC, 1 MG/DOSE, 4 MG/3ML SOPN Inject 1 mg into the skin once a week.     potassium chloride  SA (KLOR-CON  M) 20 MEQ tablet Take 2 tablets (40 mEq total) by mouth as directed. Please take 40 meq when you take your Metolazone  60 tablet 6   sotalol  (BETAPACE ) 80 MG tablet Take 1 tablet (80 mg total) by mouth 2 (two) times daily. 180 tablet 2   spironolactone  (ALDACTONE ) 25 MG tablet Take 12.5 mg by mouth daily.      torsemide  (DEMADEX ) 20 MG tablet TAKE 2 TABLETS (40 MG TOTAL) BY MOUTH EVERY MORNING AND 1 TABLET (20 MG TOTAL) AT BEDTIME. 270 tablet 3   TRESIBA FLEXTOUCH 200 UNIT/ML FlexTouch Pen Inject 48 Units into the skin at bedtime.     XARELTO  20 MG TABS tablet TAKE 1 TABLET BY MOUTH DAILY WITH SUPPER 90 tablet  3   No current facility-administered medications for this encounter.    No Known Allergies  ROS- All systems are reviewed and negative except as per the HPI above  Physical Exam: Vitals:   04/22/24 0938  BP: (!) 84/68  Pulse: 69  Weight: (!) 143 kg  Height: 5' 5 (1.651 m)     Wt Readings from Last 3 Encounters:  04/22/24 (!) 143 kg  03/16/24 (!) 144.5 kg  02/11/24 (!) 142.9 kg  Labs: Lab Results  Component Value Date   NA 142 03/16/2024   K 4.3 03/16/2024   CL 103 03/16/2024   CO2 20 03/16/2024   GLUCOSE 173 (H) 03/16/2024   BUN 56 (H) 03/16/2024   CREATININE 1.87 (H) 03/16/2024   CALCIUM  9.2 03/16/2024   PHOS 4.1 03/04/2013   MG 3.0 (H) 03/16/2024   Lab Results  Component Value Date   INR 1.72 01/20/2018   Lab Results  Component Value Date   CHOL 181 04/09/2011   HDL 37 (L) 04/09/2011   LDLCALC 117 (H) 04/09/2011   TRIG 137 04/09/2011   GEN- The patient is well appearing, alert and oriented x 3 today.   Neck - no JVD or carotid bruit noted Lungs- Clear to ausculation bilaterally, normal work of breathing Heart- Regular rate (V paced), no murmurs, rubs or gallops, PMI not laterally displaced Extremities- no clubbing, cyanosis, or edema Skin - no rash or ecchymosis noted   EKG-  Vent. rate 69 BPM PR interval * ms QRS duration 120 ms QT/QTcB 422/452 ms P-R-T axes 242 186 -12 Ventricular-paced rhythm Biventricular pacemaker detected Abnormal ECG When compared with ECG of 16-Mar-2024 10:22, Vent. rate has decreased BY 12 BP  ECHO 11/12/23: 1. Left ventricular ejection fraction, by estimation, is 30 to 35%. The  left ventricle has moderately decreased function. The left ventricle  demonstrates global hypokinesis. The left ventricular internal cavity size  was moderately dilated. Left  ventricular diastolic parameters are indeterminate.   2. Right ventricular systolic function was not well visualized. The right  ventricular size is not well  visualized. Tricuspid regurgitation signal is  inadequate for assessing PA pressure.   3. Left atrial size was moderately dilated.   4. The mitral valve is normal in structure. Trivial mitral valve  regurgitation.   5. The aortic valve is tricuspid. Aortic valve regurgitation is not  visualized. No aortic stenosis is present.   CHA2DS2-VASc Score = 4  The patient's score is based upon: CHF History: 1 HTN History: 1 Diabetes History: 1 Stroke History: 0 Vascular Disease History: 1 Age Score: 0 Gender Score: 0       ASSESSMENT AND PLAN: Persistent Atrial Fibrillation (ICD10:  I48.19) The patient's CHA2DS2-VASc score is 4, indicating a 4.8% annual risk of stroke.    He is currently in V paced rhythm. We discussed the procedure cardioversion to try to convert to NSR. We discussed the risks vs benefits of this procedure and how ultimately we cannot predict whether a patient will have early return of arrhythmia post procedure. After discussion, the patient wishes to proceed with cardioversion. Labs drawn today. He will have to hold his Farxiga and Ozempic.   Informed Consent   Shared Decision Making/Informed Consent The risks (stroke, cardiac arrhythmias rarely resulting in the need for a temporary or permanent pacemaker, skin irritation or burns and complications associated with conscious sedation including aspiration, arrhythmia, respiratory failure and death), benefits (restoration of normal sinus rhythm) and alternatives of a direct current cardioversion were explained in detail to Gary Hutchinson and he agrees to proceed.       High risk medication monitoring (ICD10: U5195107) Patient requires ongoing monitoring for anti-arrhythmic medication which has the potential to cause life threatening arrhythmias or AV block. Qtc stable. Continue sotalol  80 mg BID.   Chronic systolic heart failure Appears euvolemic today.  Secondary Hypercoagulable State (ICD10:  D68.69) The patient is at  significant risk for stroke/thromboembolism based upon his CHA2DS2-VASc Score of 4.  Continue Rivaroxaban  (Xarelto ).  No missed doses.     Follow up 2 weeks after DCCV.    Gary Heinrich, [name] Afib Clinic [facility] [address] [place], [place] [address] [phone]

## 2024-04-22 NOTE — Progress Notes (Signed)
 Primary Care Physician: Onita Rush, MD Referring Physician: Dr. Kelsie Missy Gary Hutchinson is a 62 y.o. male with a h/o chronic systolic heart failure, s/p MDT ICD, HTN, atrial fibrillation, DM, OSA on Cpap, hx of amio thyrotoxicity, obesity, that is in the afib clinic for f/u recent ER visit.[name] He received a shock from his ICD after a day full of activities, over 7000 steps. He was found to have rapid afib with RVR which triggered the shock. Dr. Kelsie felt antiarrythmic therapy was indicated and started 80 mg sotalol  bid and he was discharged home.   On evaluation today, he is currently in AV paced rhythm. Device clinic alert on 6/3 for Afib ongoing since 5/26. He notes that likely dietary indiscretion was a contributing factor. No missed doses of sotalol  80 mg BID or Xarelto  20 mg daily.   On follow up 04/22/24, he is currently in V paced rhythm. Device alert on 7/7 for ongoing Afib since 6/12. No missed doses of sotalol  or Xarelto .   Today, he denies symptoms of palpitations, chest pain, shortness of breath, orthopnea, PND, lower extremity edema, dizziness, presyncope, syncope, or neurologic sequela. The patient is tolerating medications without difficulties and is otherwise without complaint today.   Past Medical History:  Diagnosis Date   AICD (automatic cardioverter/defibrillator) present    Medtronic   Alcohol abuse    now quit   Anemia    iron defi   Arthritis    fingers, knees, some days all my joints ache (11/16/2015)   Asthma    Cholecystitis    Chronic systolic congestive heart failure (HCC)    a. RHC (02/2011) RA 31/27, RV 71/27, PA67/45, PCWP 46, PA 49%, Fick CO: 3.4 b. ECHO (03/2014) EF 30-35%, grade II DD, mild MR, RV poorly visualized appears mildly decreased c. RHC (05/2014) RA 13, RV 54/4/11, PA 60/22 (37), PCWP 17, Fick CO/CI: 4.4 / 1.9, Thermo CO/CI: 3.8 /1.6, PVR 5.2 WU, PA 57% and 59%   Diverticulosis of colon    Gout    Hemorrhoids, internal    Hyperlipidemia     Hypertension    Morbid obesity (HCC)    Nonischemic cardiomyopathy (HCC)    a. LHC (02/2011) normal coronaries   OSA on CPAP    Paroxysmal atrial fibrillation (HCC)    Pneumonia 2000s X 1   Pulmonary hypertension (HCC)    Renal insufficiency    Type II diabetes mellitus (HCC)    Ventricular tachycardia [name])    s/p MDT ICD implant   Past Surgical History:  Procedure Laterality Date   CARDIAC CATHETERIZATION  03/08/2004   EF 25-30%   CARDIOVERSION N/A 10/17/2015   Procedure: CARDIOVERSION;  Surgeon: Aleene JINNY Passe, MD;  Location: [facility] ENDOSCOPY;  Service: Cardiovascular;  Laterality: N/A;   EP IMPLANTABLE DEVICE N/A 11/16/2015   Procedure: BiVI Upgrade;  Surgeon: Lynwood Kelsie, MD;  Medtronic Viva Quad XT   INSERTION OF ICD  06/2008   ICD- Medtronic    RIGHT HEART CATHETERIZATION N/A 05/23/2014   Procedure: RIGHT HEART CATH;  Surgeon: Toribio JONELLE Fuel, MD;  Location: [facility] CATH LAB;  Service: Cardiovascular;  Laterality: N/A;   TRANSTHORACIC ECHOCARDIOGRAM  05/27/2008   EF 30-35%   US  ECHOCARDIOGRAPHY  08/22/2008   EF 30-35%    Current Outpatient Medications  Medication Sig Dispense Refill   acetaminophen  (TYLENOL ) 500 MG tablet Take 500 mg by mouth every 6 (six) hours as needed for moderate pain or headache.  (Patient taking differently: Take  500 mg by mouth as needed for moderate pain (pain score 4-6) or headache.)     albuterol  (VENTOLIN  HFA) 108 (90 Base) MCG/ACT inhaler Inhale 2 puffs into the lungs every 6 (six) hours as needed for wheezing or shortness of breath.     allopurinol  (ZYLOPRIM ) 100 MG tablet Take 100 mg by mouth daily.     atorvastatin  (LIPITOR) 80 MG tablet Take 80 mg by mouth at bedtime.     colchicine  0.6 MG tablet Take 0.6 mg by mouth daily as needed (for gout flares).  (Patient taking differently: Take 0.6 mg by mouth as needed (for gout flares).)     dapagliflozin propanediol (FARXIGA) 10 MG TABS tablet Take 10 mg by mouth daily.     digoxin  (LANOXIN ) 0.25 MG  tablet TAKE 1/2 TABLET BY MOUTH EVERY OTHER DAY 45 tablet 0   ENTRESTO  97-103 MG TAKE 1 TABLET BY MOUTH TWICE A DAY 180 tablet 3   Fluticasone -Salmeterol (ADVAIR) 250-50 MCG/DOSE AEPB Inhale 1 puff into the lungs every 12 (twelve) hours as needed (for flares/shortness of breath). Reported on 02/06/2016     hydrALAZINE  (APRESOLINE ) 25 MG tablet TAKE 1 TABLET BY MOUTH THREE TIMES A DAY 270 tablet 1   insulin  lispro (HUMALOG) 100 UNIT/ML injection Inject 10 Units into the skin See admin instructions. 4 to 5 times daily with a meal, depending on how many meals are eaten that day.     isosorbide  mononitrate (IMDUR ) 30 MG 24 hr tablet TAKE 1 TABLET (30 MG TOTAL) BY MOUTH IN THE MORNING AND AT BEDTIME. 180 tablet 3   metolazone  (ZAROXOLYN ) 2.5 MG tablet Take 2.5 mg by mouth daily as needed (for edema). (Patient taking differently: Take 2.5 mg by mouth as needed (for edema).)     montelukast  (SINGULAIR ) 10 MG tablet Take 10 mg by mouth as needed (allergies).     ondansetron  (ZOFRAN -ODT) 8 MG disintegrating tablet Take 1 tablet (8 mg total) by mouth every 8 (eight) hours as needed for nausea or vomiting. (Patient taking differently: Take 8 mg by mouth as needed for nausea or vomiting.) 20 tablet 0   OZEMPIC, 1 MG/DOSE, 4 MG/3ML SOPN Inject 1 mg into the skin once a week.     potassium chloride  SA (KLOR-CON  M) 20 MEQ tablet Take 2 tablets (40 mEq total) by mouth as directed. Please take 40 meq when you take your Metolazone  60 tablet 6   sotalol  (BETAPACE ) 80 MG tablet Take 1 tablet (80 mg total) by mouth 2 (two) times daily. 180 tablet 2   spironolactone  (ALDACTONE ) 25 MG tablet Take 12.5 mg by mouth daily.      torsemide  (DEMADEX ) 20 MG tablet TAKE 2 TABLETS (40 MG TOTAL) BY MOUTH EVERY MORNING AND 1 TABLET (20 MG TOTAL) AT BEDTIME. 270 tablet 3   TRESIBA FLEXTOUCH 200 UNIT/ML FlexTouch Pen Inject 48 Units into the skin at bedtime.     XARELTO  20 MG TABS tablet TAKE 1 TABLET BY MOUTH DAILY WITH SUPPER 90 tablet  3   No current facility-administered medications for this encounter.    No Known Allergies  ROS- All systems are reviewed and negative except as per the HPI above  Physical Exam: Vitals:   04/22/24 0938  BP: (!) 84/68  Pulse: 69  Weight: (!) 143 kg  Height: 5' 5 (1.651 m)     Wt Readings from Last 3 Encounters:  04/22/24 (!) 143 kg  03/16/24 (!) 144.5 kg  02/11/24 (!) 142.9 kg  Labs: Lab Results  Component Value Date   NA 142 03/16/2024   K 4.3 03/16/2024   CL 103 03/16/2024   CO2 20 03/16/2024   GLUCOSE 173 (H) 03/16/2024   BUN 56 (H) 03/16/2024   CREATININE 1.87 (H) 03/16/2024   CALCIUM  9.2 03/16/2024   PHOS 4.1 03/04/2013   MG 3.0 (H) 03/16/2024   Lab Results  Component Value Date   INR 1.72 01/20/2018   Lab Results  Component Value Date   CHOL 181 04/09/2011   HDL 37 (L) 04/09/2011   LDLCALC 117 (H) 04/09/2011   TRIG 137 04/09/2011   GEN- The patient is well appearing, alert and oriented x 3 today.   Neck - no JVD or carotid bruit noted Lungs- Clear to ausculation bilaterally, normal work of breathing Heart- Regular rate (V paced), no murmurs, rubs or gallops, PMI not laterally displaced Extremities- no clubbing, cyanosis, or edema Skin - no rash or ecchymosis noted   EKG-  Vent. rate 69 BPM PR interval * ms QRS duration 120 ms QT/QTcB 422/452 ms P-R-T axes 242 186 -12 Ventricular-paced rhythm Biventricular pacemaker detected Abnormal ECG When compared with ECG of 16-Mar-2024 10:22, Vent. rate has decreased BY 12 BP  ECHO 11/12/23: 1. Left ventricular ejection fraction, by estimation, is 30 to 35%. The  left ventricle has moderately decreased function. The left ventricle  demonstrates global hypokinesis. The left ventricular internal cavity size  was moderately dilated. Left  ventricular diastolic parameters are indeterminate.   2. Right ventricular systolic function was not well visualized. The right  ventricular size is not well  visualized. Tricuspid regurgitation signal is  inadequate for assessing PA pressure.   3. Left atrial size was moderately dilated.   4. The mitral valve is normal in structure. Trivial mitral valve  regurgitation.   5. The aortic valve is tricuspid. Aortic valve regurgitation is not  visualized. No aortic stenosis is present.   CHA2DS2-VASc Score = 4  The patient's score is based upon: CHF History: 1 HTN History: 1 Diabetes History: 1 Stroke History: 0 Vascular Disease History: 1 Age Score: 0 Gender Score: 0       ASSESSMENT AND PLAN: Persistent Atrial Fibrillation (ICD10:  I48.19) The patient's CHA2DS2-VASc score is 4, indicating a 4.8% annual risk of stroke.    He is currently in V paced rhythm. We discussed the procedure cardioversion to try to convert to NSR. We discussed the risks vs benefits of this procedure and how ultimately we cannot predict whether a patient will have early return of arrhythmia post procedure. After discussion, the patient wishes to proceed with cardioversion. Labs drawn today. He will have to hold his Farxiga and Ozempic.   Informed Consent   Shared Decision Making/Informed Consent The risks (stroke, cardiac arrhythmias rarely resulting in the need for a temporary or permanent pacemaker, skin irritation or burns and complications associated with conscious sedation including aspiration, arrhythmia, respiratory failure and death), benefits (restoration of normal sinus rhythm) and alternatives of a direct current cardioversion were explained in detail to Mr. Halt and he agrees to proceed.       High risk medication monitoring (ICD10: U5195107) Patient requires ongoing monitoring for anti-arrhythmic medication which has the potential to cause life threatening arrhythmias or AV block. Qtc stable. Continue sotalol  80 mg BID.   Chronic systolic heart failure Appears euvolemic today.  Secondary Hypercoagulable State (ICD10:  D68.69) The patient is at  significant risk for stroke/thromboembolism based upon his CHA2DS2-VASc Score of 4.  Continue Rivaroxaban  (Xarelto ).  No missed doses.     Follow up 2 weeks after DCCV.    Dorn Heinrich, [name] [facility] [address] [place], [place] [address] [phone]

## 2024-04-25 ENCOUNTER — Ambulatory Visit: Attending: Cardiology

## 2024-04-25 ENCOUNTER — Ambulatory Visit: Payer: Self-pay | Admitting: *Deleted

## 2024-04-25 DIAGNOSIS — Z95810 Presence of automatic (implantable) cardiac defibrillator: Secondary | ICD-10-CM | POA: Diagnosis not present

## 2024-04-25 DIAGNOSIS — I5022 Chronic systolic (congestive) heart failure: Secondary | ICD-10-CM

## 2024-04-26 NOTE — Progress Notes (Signed)
 Remote ICD transmission.

## 2024-04-26 NOTE — Progress Notes (Signed)
 EPIC Encounter for ICM Monitoring  Patient Name: Gary Hutchinson is a 63 y.o. male Date: 04/26/2024 Primary Care Physican: Onita Rush, MD Primary Cardiologist: [name] Electrophysiologist: Inocencio Bi-V Pacing: 75% (decreased from 98.2% on last ICM report)   11/12/2023  Weight: 312 lbs 11/25/2023 Weight: 312 lbs 02/09/2024 Weight:  319 lbs 5/212025 Weight: 316 lbs  03/16/2024 Office Weight: 318 lbs 04/22/2024 Weight:  315 lbs   Time in AT/AF  24.0 hr/day (100.0%) Longest AT/AF 25 days   Battery Longevity:  1 month                     Spoke with patient and heart failure questions reviewed.  Transmission results reviewed.  Pt asymptomatic for fluid accumulation except for feeling tired.  Explained being in Afib can also contribute to fluid accumulation.    Diet: Attempting to limit salt intake   Optivol thoracic impedance trending below baseline suggesting possible fluid accumulation starting 7/16.  Also suggesting possible fluid accumulation occurred from 7/3-7/15.   Prescribed dosage:  Torsemide  20 mg Take 2 tablets (40 mg total) by mouth every morning and 1 tablet (20 total) at bedtime.  He reports Dr [name] has approved that he can take extra Torsemide  or Metolazone  when needed.  Potassium 20 mEq take 2 tablets (40 mEq total) by mouth as directed.  Please take 40 mEq when you take your Metolazone .  Metolazone  2.5 mg 1 tablet as needed (for edema).   Spironolactone  25 mg take 0.5 tablet (12.5 mg total) by mouth daily   Labs: 04/22/2024 Creatinine 2.02, BUN 44, Potassium 4.6, Sodium 136, GFR 36  03/16/2024 Creatinine 1.87, BUN 56, Potassium 4.3, Sodium 142, GFR 40  01/21/2024 Creatinine 1.79, BUN 65, Potassium 3.8, Sodium 136, eGFR 42 11/12/2023 Creatinine 2.20, BUN 69, Potassium 4.5, Sodium 136, eGFR 33 07/21/2023 Creatinine 2.00, BUN 54, Potassium 4.4, Sodium 141, eGFR 37 04/27/2023 Creatinine 1.80, BUN 39, Potassium 3.9, Sodium 137, eGFR 42 03/09/2023 Creatinine 2.02, BUN 89,  Potassium 3.4, Sodium 138  01/19/2023 Creatinine 1.88, BUN 53, Potassium 4.1, Sodium 141, eGFR 40 10/27/2022 Creatinine 1.82, BUN 54, Potassium 4.1, Sodium 134, eGFR 42 10/17/2022 Creatinine 1.90, BUN 47, Potassium 4.3, Sodium 141 A complete set of results can be found in Results Review.   Recommendations:   Advised to limit salt and fluid intake.     Follow-up plan: ICM clinic phone appointment on 05/09/2024 to recheck fluid levels.   91 day device clinic remote transmission 05/09/2024.     EP/Cardiology Office Visits:  05/11/2024 with HF clinic.  Recall 08/09/2024 with Jodie Passey, PA.     Copy of ICM check sent to Dr. Inocencio.     3 month ICM trend: 04/25/2024.    12-14 Month ICM trend:     Mitzie GORMAN Garner, RN 04/26/2024 4:18 PM

## 2024-04-28 ENCOUNTER — Telehealth: Payer: Self-pay

## 2024-04-28 DIAGNOSIS — I5022 Chronic systolic (congestive) heart failure: Secondary | ICD-10-CM

## 2024-04-28 NOTE — Progress Notes (Incomplete)
 Left VM for return call to review instructions for appointment on 04/29/24.

## 2024-04-28 NOTE — Telephone Encounter (Signed)
 ICD reached RRT 04/28/24.   Had recent OV w/ Jodie 02/11/24 and gen change was discussed. Does not need OV at this point.   Routing to EP procedure scheduler.

## 2024-04-29 ENCOUNTER — Other Ambulatory Visit: Payer: Self-pay

## 2024-04-29 ENCOUNTER — Encounter (HOSPITAL_COMMUNITY): Payer: Self-pay | Admitting: Cardiology

## 2024-04-29 ENCOUNTER — Ambulatory Visit (HOSPITAL_COMMUNITY): Admitting: Registered Nurse

## 2024-04-29 ENCOUNTER — Ambulatory Visit (HOSPITAL_COMMUNITY)
Admission: RE | Admit: 2024-04-29 | Discharge: 2024-04-29 | Disposition: A | Discharge status: Home / Self Care | Attending: Cardiology | Admitting: Cardiology

## 2024-04-29 ENCOUNTER — Encounter (HOSPITAL_COMMUNITY)
Admission: RE | Disposition: A | Discharge status: Home / Self Care | Payer: Self-pay | Source: Home / Self Care | Attending: Cardiology

## 2024-04-29 DIAGNOSIS — Z95810 Presence of automatic (implantable) cardiac defibrillator: Secondary | ICD-10-CM | POA: Diagnosis not present

## 2024-04-29 DIAGNOSIS — Z7901 Long term (current) use of anticoagulants: Secondary | ICD-10-CM | POA: Insufficient documentation

## 2024-04-29 DIAGNOSIS — I5022 Chronic systolic (congestive) heart failure: Secondary | ICD-10-CM | POA: Insufficient documentation

## 2024-04-29 DIAGNOSIS — D6869 Other thrombophilia: Secondary | ICD-10-CM | POA: Diagnosis not present

## 2024-04-29 DIAGNOSIS — Z7985 Long-term (current) use of injectable non-insulin antidiabetic drugs: Secondary | ICD-10-CM | POA: Insufficient documentation

## 2024-04-29 DIAGNOSIS — Z7984 Long term (current) use of oral hypoglycemic drugs: Secondary | ICD-10-CM | POA: Insufficient documentation

## 2024-04-29 DIAGNOSIS — Z6841 Body Mass Index (BMI) 40.0 and over, adult: Secondary | ICD-10-CM | POA: Insufficient documentation

## 2024-04-29 DIAGNOSIS — I4819 Other persistent atrial fibrillation: Secondary | ICD-10-CM

## 2024-04-29 DIAGNOSIS — I484 Atypical atrial flutter: Secondary | ICD-10-CM | POA: Diagnosis not present

## 2024-04-29 DIAGNOSIS — Z79899 Other long term (current) drug therapy: Secondary | ICD-10-CM | POA: Diagnosis not present

## 2024-04-29 DIAGNOSIS — G4733 Obstructive sleep apnea (adult) (pediatric): Secondary | ICD-10-CM | POA: Diagnosis not present

## 2024-04-29 DIAGNOSIS — Z794 Long term (current) use of insulin: Secondary | ICD-10-CM | POA: Diagnosis not present

## 2024-04-29 DIAGNOSIS — I110 Hypertensive heart disease with heart failure: Secondary | ICD-10-CM

## 2024-04-29 DIAGNOSIS — I4892 Unspecified atrial flutter: Secondary | ICD-10-CM | POA: Diagnosis not present

## 2024-04-29 DIAGNOSIS — E119 Type 2 diabetes mellitus without complications: Secondary | ICD-10-CM | POA: Diagnosis not present

## 2024-04-29 HISTORY — PX: CARDIOVERSION: EP1203

## 2024-04-29 SURGERY — CARDIOVERSION (CATH LAB)
Anesthesia: General

## 2024-04-29 MED ORDER — PHENYLEPHRINE HCL (PRESSORS) 10 MG/ML IV SOLN
INTRAVENOUS | Status: DC | PRN
  Administered 2024-04-29 (×2): 160 ug via INTRAVENOUS

## 2024-04-29 MED ORDER — LIDOCAINE HCL (CARDIAC) PF 100 MG/5ML IV SOSY
PREFILLED_SYRINGE | INTRAVENOUS | Status: DC | PRN
  Administered 2024-04-29: 60 mg via INTRATRACHEAL

## 2024-04-29 MED ORDER — PROPOFOL 10 MG/ML IV BOLUS
INTRAVENOUS | Status: DC | PRN
  Administered 2024-04-29: 80 mg via INTRAVENOUS

## 2024-04-29 SURGICAL SUPPLY — 1 items: PAD DEFIB RADIO PHYSIO CONN (PAD) ×1 IMPLANT

## 2024-04-29 NOTE — Transfer of Care (Signed)
 Immediate Anesthesia Transfer of Care Note  Patient: Gary Hutchinson  Procedure(s) Performed: CARDIOVERSION  Patient Location: Cath Lab  Anesthesia Type:MAC  Level of Consciousness: awake, alert , and oriented  Airway & Oxygen Therapy: Patient Spontanous Breathing and Patient connected to nasal cannula oxygen  Post-op Assessment: Report given to RN and Post -op Vital signs reviewed and stable  Post vital signs: Reviewed and stable  Last Vitals:  Vitals Value Taken Time  BP    Temp    Pulse    Resp    SpO2      Last Pain:  Vitals:   04/29/24 0854  TempSrc: Temporal         Complications: No notable events documented.

## 2024-04-29 NOTE — Interval H&P Note (Signed)
 History and Physical Interval Note:  04/29/2024 10:14 AM  Missy LABOR Rentz  has presented today for surgery, with the diagnosis of AFIB.  The various methods of treatment have been discussed with the patient and family. After consideration of risks, benefits and other options for treatment, the patient has consented to  Procedure(s): CARDIOVERSION (N/A) as a surgical intervention.  The patient's history has been reviewed, patient examined, no change in status, stable for surgery.  I have reviewed the patient's chart and labs.  Questions were answered to the patient's satisfaction.    Informed Consent   Shared Decision Making/Informed Consent The risks (stroke, cardiac arrhythmias rarely resulting in the need for a temporary or permanent pacemaker, skin irritation or burns and complications associated with conscious sedation including aspiration, arrhythmia, respiratory failure and death), benefits (restoration of normal sinus rhythm) and alternatives of a direct current cardioversion were explained in detail to Mr. Jentz and he agrees to proceed.      No missed doses of Xarelto .  Contact person: Son   Madonna Large, DO, [facility]  A Division of [facility] [address], [place], [place] [address]  [place], [place] [address] 10:15 AM

## 2024-04-29 NOTE — CV Procedure (Addendum)
   DIRECT CURRENT CARDIOVERSION  NAME:  Gary Hutchinson    MRN: [id number] DOB:  [birth year]    ADMIT DATE: 04/29/2024  Indication:  Symptomatic atrial flutter  Procedure Note:  The patient signed informed consent.  They have had had therapeutic anticoagulation with Xarelto  greater than 3 weeks.  Anesthesia was administered by Dr. Maryclare.  Adequate airway was maintained throughout and vital followed per protocol.  They were cardioverted x 1 with 200J of biphasic synchronized energy.  They converted to ASVP.  There were no apparent complications.  The patient had normal neuro status and respiratory status post procedure with vitals stable as recorded elsewhere.   Device checked by industry rep at bedside.    Called his son twice and no answer.   Follow up: They will continue on current medical therapy and follow up with cardiology as scheduled.  Madonna Large, DO, [facility] [place]  [facility]  [address] [place], [place] [address] [phone] 11:02 AM

## 2024-05-02 ENCOUNTER — Encounter

## 2024-05-02 ENCOUNTER — Encounter (HOSPITAL_COMMUNITY): Payer: Self-pay | Admitting: Cardiology

## 2024-05-02 NOTE — Anesthesia Postprocedure Evaluation (Signed)
 Anesthesia Post Note  Patient: Gary Hutchinson  Procedure(s) Performed: CARDIOVERSION     Patient location during evaluation: Cath Lab Anesthesia Type: General Level of consciousness: awake and alert Pain management: pain level controlled Vital Signs Assessment: post-procedure vital signs reviewed and stable Respiratory status: spontaneous breathing, nonlabored ventilation, respiratory function stable and patient connected to nasal cannula oxygen Cardiovascular status: blood pressure returned to baseline and stable Postop Assessment: no apparent nausea or vomiting Anesthetic complications: no   No notable events documented.  Last Vitals:  Vitals:   04/29/24 1115 04/29/24 1120  BP:  (!) 138/100  Pulse: 75 74  Resp: (!) 22 19  Temp:    SpO2: 94% 95%    Last Pain:  Vitals:   04/29/24 1120  TempSrc:   PainSc: 0-No pain                 Synthia Fairbank S

## 2024-05-02 NOTE — Anesthesia Preprocedure Evaluation (Addendum)
 Anesthesia Evaluation  Patient identified by MRN, date of birth, ID band Patient awake    Reviewed: Allergy & Precautions, H&P , NPO status , Patient's Chart, lab work & pertinent test results  Airway Mallampati: II   Neck ROM: full    Dental   Pulmonary asthma , sleep apnea    breath sounds clear to auscultation       Cardiovascular hypertension, +CHF  + dysrhythmias Atrial Fibrillation + Cardiac Defibrillator  Rhythm:irregular Rate:Normal     Neuro/Psych    GI/Hepatic   Endo/Other  diabetes, Type 2 Hyperthyroidism   Renal/GU Renal disease     Musculoskeletal  (+) Arthritis ,    Abdominal   Peds  Hematology  (+) Blood dyscrasia, anemia   Anesthesia Other Findings   Reproductive/Obstetrics                              Anesthesia Physical Anesthesia Plan  ASA: 4  Anesthesia Plan: General   Post-op Pain Management:    Induction: Intravenous  PONV Risk Score and Plan: 2 and Propofol  infusion and Treatment may vary due to age or medical condition  Airway Management Planned: Nasal Cannula  Additional Equipment:   Intra-op Plan:   Post-operative Plan:   Informed Consent: I have reviewed the patients History and Physical, chart, labs and discussed the procedure including the risks, benefits and alternatives for the proposed anesthesia with the patient or authorized representative who has indicated his/her understanding and acceptance.     Dental advisory given  Plan Discussed with: CRNA, Anesthesiologist and Surgeon  Anesthesia Plan Comments:         Anesthesia Quick Evaluation

## 2024-05-02 NOTE — Telephone Encounter (Signed)
 Pt is scheduled for CRT-D Gen Change with Dr. Inocencio on 9/19 at 2:30 pm. He will have labs done on 9/5.  I will mail instruction letter to pt. He will pick up surgical scrub from front desk when he comes in for labs.

## 2024-05-09 ENCOUNTER — Ambulatory Visit

## 2024-05-09 DIAGNOSIS — I5022 Chronic systolic (congestive) heart failure: Secondary | ICD-10-CM | POA: Diagnosis not present

## 2024-05-10 ENCOUNTER — Telehealth (HOSPITAL_COMMUNITY): Payer: Self-pay

## 2024-05-10 NOTE — Telephone Encounter (Signed)
 Called to confirm/remind patient of their appointment at the [facility] on 05/11/24.   Appointment:   [] Confirmed  [x] Left mess   [] No answer/No voice mail  [] VM Full/unable to leave message  [] Phone not in service  And to bring in all medications and/or complete list.

## 2024-05-10 NOTE — Progress Notes (Signed)
 Patient ID: Gary Hutchinson, male   DOB: October 28, 1960, 63 y.o.   MRN: [id number]    [facility] Note   PCP: Norleen Jungling EP: Lynwood Rakers HF Cardiology: Dr. Cherrie  HPI: Gary Hutchinson is a 63 y.o. male with a history of chronic systolic heart failure due to NICM s/p ICD, HTN, DM II, OSA on CPAP, history of ventricular tachycardia status post AICD placement in 2009, atrial fibrillation and morbid obesity.    LHC 5/12 No CAD  Admitted 6/15 for A/C HF and cardiogenic shock (co-ox 42%). Diuresed with IV lasix  and milrinone  which were weaned off.  He went into Afib with RVR and chemically cardioverted back to NSR with IV amiodarone .   RHC 8/15: Left sided filling pressures well compensated, mild pulm HTN with elevated right-sided pressures and mod/severely depressed CO. Discussion about trial of milrinone  but patient wanted to hold off.  Has h/o AF/AFL s/p multiple DCCVs last oon 07/29/2017.  Echo 6/23 EF 32% RV mildly reduced Moderate MR  Follow up 1/24, had COVID 12/23 and has in AF for 2 weeks resulting in loss of BIV pacing and volume overload. Back in NSR.   Echo today 11/12/23: EF 30-35% RV mild HK Personally reviewed   Returns for routine f/u. Feels good. Denies CP or undue SOB. Says he is taking one day at a time. Tries to do some walking every day. Fluid up and down. Follows with Mitzie Garner in [facility] clinic. Takes metolazone  on occasion  ICD interrogation: Fluid up and down. Now down. No VT/AF. Activity level ~1.5 hr/day 100% BIV pacing Personally reviewed    Cardiac Studies:   - Echo (7/13): EF 25-30%.  Grade 1 diastolic dysfunction.  Mild MR.  Mod dilated LA.    - Echo (1/17): EF ~40%. RV mildly dilated.   - Echo (8/17): EF 25-35%  - Echo (10/18): EF  25- 30%  - Echo (3/21):EF ~30% RV ok.   - Echo (6/23): EF 32%, RV mildly reduced, moderate MR  SH: Lives with son in [place]. Disabled. No ETOH or tobacco abuse  FH; Mother living: HT         Father deceased: was not part of his life so not sure health issues  Review of systems complete and found to be negative unless listed in HPI.   Past Medical History:  Diagnosis Date   AICD (automatic cardioverter/defibrillator) present    Medtronic   Alcohol abuse    now quit   Anemia    iron defi   Arthritis    fingers, knees, some days all my joints ache (11/16/2015)   Asthma    Cholecystitis    Chronic systolic congestive heart failure (HCC)    a. RHC (02/2011) RA 31/27, RV 71/27, PA67/45, PCWP 46, PA 49%, Fick CO: 3.4 b. ECHO (03/2014) EF 30-35%, grade II DD, mild MR, RV poorly visualized appears mildly decreased c. RHC (05/2014) RA 13, RV 54/4/11, PA 60/22 (37), PCWP 17, Fick CO/CI: 4.4 / 1.9, Thermo CO/CI: 3.8 /1.6, PVR 5.2 WU, PA 57% and 59%   Diverticulosis of colon    Gout    Hemorrhoids, internal    Hyperlipidemia    Hypertension    Morbid obesity (HCC)    Nonischemic cardiomyopathy (HCC)    a. LHC (02/2011) normal coronaries   OSA on CPAP    Paroxysmal atrial fibrillation (HCC)    Pneumonia 2000s X 1   Pulmonary hypertension (HCC)    Renal insufficiency  Type II diabetes mellitus (HCC)    Ventricular tachycardia (HCC)    s/p MDT ICD implant   Current Outpatient Medications  Medication Sig Dispense Refill   acetaminophen  (TYLENOL ) 500 MG tablet Take 500 mg by mouth every 6 (six) hours as needed for moderate pain or headache.      albuterol  (VENTOLIN  HFA) 108 (90 Base) MCG/ACT inhaler Inhale 2 puffs into the lungs every 6 (six) hours as needed for wheezing or shortness of breath.     allopurinol  (ZYLOPRIM ) 100 MG tablet Take 100 mg by mouth daily.     atorvastatin  (LIPITOR) 80 MG tablet Take 80 mg by mouth at bedtime.     colchicine  0.6 MG tablet Take 0.6 mg by mouth daily as needed (for gout flares).      dapagliflozin propanediol (FARXIGA) 10 MG TABS tablet Take 10 mg by mouth daily.     digoxin  (LANOXIN ) 0.25 MG tablet TAKE 1/2 TABLET BY MOUTH EVERY OTHER DAY 45  tablet 0   ENTRESTO  97-103 MG TAKE 1 TABLET BY MOUTH TWICE A DAY 180 tablet 3   Fluticasone -Salmeterol (ADVAIR) 250-50 MCG/DOSE AEPB Inhale 1 puff into the lungs every 12 (twelve) hours as needed (for flares/shortness of breath). Reported on 02/06/2016     hydrALAZINE  (APRESOLINE ) 25 MG tablet TAKE 1 TABLET BY MOUTH THREE TIMES A DAY 270 tablet 1   insulin  lispro (HUMALOG) 100 UNIT/ML injection Inject 10 Units into the skin See admin instructions. 4 to 5 times daily with a meal, depending on how many meals are eaten that day.     isosorbide  mononitrate (IMDUR ) 30 MG 24 hr tablet TAKE 1 TABLET (30 MG TOTAL) BY MOUTH IN THE MORNING AND AT BEDTIME. 180 tablet 3   metolazone  (ZAROXOLYN ) 2.5 MG tablet Take 2.5 mg by mouth daily as needed (for edema).     montelukast  (SINGULAIR ) 10 MG tablet Take 10 mg by mouth as needed (allergies).     ondansetron  (ZOFRAN -ODT) 8 MG disintegrating tablet Take 1 tablet (8 mg total) by mouth every 8 (eight) hours as needed for nausea or vomiting. 20 tablet 0   OZEMPIC, 1 MG/DOSE, 4 MG/3ML SOPN Inject 1 mg into the skin once a week.     potassium chloride  SA (KLOR-CON  M) 20 MEQ tablet Take 2 tablets (40 mEq total) by mouth as directed. Please take 40 meq when you take your Metolazone  60 tablet 6   sotalol  (BETAPACE ) 80 MG tablet Take 1 tablet (80 mg total) by mouth 2 (two) times daily. 180 tablet 2   spironolactone  (ALDACTONE ) 25 MG tablet Take 12.5 mg by mouth daily.      torsemide  (DEMADEX ) 20 MG tablet TAKE 2 TABLETS (40 MG TOTAL) BY MOUTH EVERY MORNING AND 1 TABLET (20 MG TOTAL) AT BEDTIME. 270 tablet 3   TRESIBA FLEXTOUCH 200 UNIT/ML FlexTouch Pen Inject 48 Units into the skin at bedtime.     XARELTO  20 MG TABS tablet TAKE 1 TABLET BY MOUTH DAILY WITH SUPPER 90 tablet 3   No current facility-administered medications for this visit.   There were no vitals taken for this visit.  Wt Readings from Last 3 Encounters:  04/29/24 (!) 142.4 kg (314 lb)  04/22/24 (!) 143 kg  (315 lb 3.2 oz)  03/16/24 (!) 144.5 kg (318 lb 9.6 oz)   Physical Exam:  General:  Obese male Well appearing. No resp difficulty HEENT: normal Neck: supple. no JVD. Carotids 2+ bilat; no bruits. No lymphadenopathy or thryomegaly appreciated. Cor: Regular rate &  rhythm. No rubs, gallops or murmurs. Lungs: clear Abdomen: obese soft, nontender, nondistended.[name] Richard bowel sounds. Extremities: no cyanosis, clubbing, rash, edema Neuro: alert & orientedx3, cranial nerves grossly intact. moves all 4 extremities w/o difficulty. Affect pleasant  ECG: As vpaced 78 QTC 469  Personally reviewed  MDT Device Interrogation ICD interrogation: Fluid up and down. Now down. No VT/AF. Activity level ~1.5 hr/day 100% BIV pacing Personally reviewed  ASSESSMENT & PLAN: 1) Chronic systolic HF: due to NICM s/p BiV Medtronic - LHC 5/12 No CAD - Echo 10/18 LVEF 25-30%.  - Echo 3/21 EF 30% - Echo (03/11/22) EF 31% RV mildly down  Moderate MR - s/p CRT-D due for generator change soon. Interrogation as above  - Echo today 11/12/23: EF 30-35% RV mild HK Personally reviewed -stable NYHA II. Volume status looks good on exam and Optivol - Continue torsemide  40 mg am/20 mg pm. OK to take metolazone  PRN. Continue to follow with ICM Clinic - Continue Entresto  97/103 mg bid. - Continue spiro 12.5 mg daily. - Off bisoprolol  in 2018. On sotalol  80 mg bid.  - Continue digoxin  0.0625 mg every other day.  - BP on low side. Drop hydralazine  from 37.5 to 25 tid  + Imdur  30 mg bid. - Continue Farxiga 10 mg daily. No GU symptoms. - Labs today  - This is the best I have seen him in some time.   2) CKD stage III  - Followed previously by nephrology. - Baseline Creatinine 1.5-1.9 - Continue Farxiga - Labs today  3) HTN  - BP low. Drop hydralazine  as above  4) OSA  - Continue Bipap   5) PAF/AFL - S/P multiple DCCVs. Most recent 07/29/17.  - Had episode of AFL with ICD shock in 2/18 at time of influenza - Recurrent AF in  12/23-1/24 in setting of COVID, - Refused ablation  - CHA2DS2VASC of at least 3. - Continue Xarelto  20 mg daily.No bleeding - Remains in NSR on Sotalol  per EP. ECG with normal QTc. Check K and Mg today  6) Obesity   - There is no height or weight on file to calculate BMI.  - On Ozempic. Followed by Dr. Onita  - Encouraged him to continue weight loss efforts  7) Amiodarone  thyroxicity   - Off Amiodarone . Completed Methimazole  treatment per PCP.   - No change.  8) DM2 - Continue insulin  + Farxiga. - Followed by Dr. Onita Harlene CHRISTELLA Glena, FNP  05/10/2024 9:30 AM

## 2024-05-11 ENCOUNTER — Other Ambulatory Visit (HOSPITAL_COMMUNITY): Payer: Self-pay

## 2024-05-11 ENCOUNTER — Ambulatory Visit (HOSPITAL_COMMUNITY)
Admission: RE | Admit: 2024-05-11 | Discharge: 2024-05-11 | Disposition: A | Discharge status: Home / Self Care | Payer: 59 | Source: Ambulatory Visit | Attending: Family Medicine | Admitting: Family Medicine

## 2024-05-11 ENCOUNTER — Encounter (HOSPITAL_COMMUNITY): Payer: Self-pay

## 2024-05-11 VITALS — BP 110/70 | HR 76 | Wt 313.8 lb

## 2024-05-11 DIAGNOSIS — Z7984 Long term (current) use of oral hypoglycemic drugs: Secondary | ICD-10-CM | POA: Insufficient documentation

## 2024-05-11 DIAGNOSIS — I10 Essential (primary) hypertension: Secondary | ICD-10-CM | POA: Diagnosis not present

## 2024-05-11 DIAGNOSIS — G4733 Obstructive sleep apnea (adult) (pediatric): Secondary | ICD-10-CM | POA: Insufficient documentation

## 2024-05-11 DIAGNOSIS — Z7985 Long-term (current) use of injectable non-insulin antidiabetic drugs: Secondary | ICD-10-CM | POA: Diagnosis not present

## 2024-05-11 DIAGNOSIS — I130 Hypertensive heart and chronic kidney disease with heart failure and stage 1 through stage 4 chronic kidney disease, or unspecified chronic kidney disease: Secondary | ICD-10-CM | POA: Insufficient documentation

## 2024-05-11 DIAGNOSIS — I5022 Chronic systolic (congestive) heart failure: Secondary | ICD-10-CM | POA: Insufficient documentation

## 2024-05-11 DIAGNOSIS — E1122 Type 2 diabetes mellitus with diabetic chronic kidney disease: Secondary | ICD-10-CM | POA: Insufficient documentation

## 2024-05-11 DIAGNOSIS — E0580 Other thyrotoxicosis without thyrotoxic crisis or storm: Secondary | ICD-10-CM

## 2024-05-11 DIAGNOSIS — N183 Chronic kidney disease, stage 3 unspecified: Secondary | ICD-10-CM | POA: Diagnosis not present

## 2024-05-11 DIAGNOSIS — I480 Paroxysmal atrial fibrillation: Secondary | ICD-10-CM | POA: Diagnosis not present

## 2024-05-11 DIAGNOSIS — Z95810 Presence of automatic (implantable) cardiac defibrillator: Secondary | ICD-10-CM | POA: Diagnosis not present

## 2024-05-11 DIAGNOSIS — Z6841 Body Mass Index (BMI) 40.0 and over, adult: Secondary | ICD-10-CM | POA: Insufficient documentation

## 2024-05-11 DIAGNOSIS — I428 Other cardiomyopathies: Secondary | ICD-10-CM | POA: Diagnosis not present

## 2024-05-11 DIAGNOSIS — I4892 Unspecified atrial flutter: Secondary | ICD-10-CM | POA: Insufficient documentation

## 2024-05-11 DIAGNOSIS — E669 Obesity, unspecified: Secondary | ICD-10-CM | POA: Diagnosis not present

## 2024-05-11 DIAGNOSIS — N1832 Chronic kidney disease, stage 3b: Secondary | ICD-10-CM | POA: Diagnosis not present

## 2024-05-11 DIAGNOSIS — Z794 Long term (current) use of insulin: Secondary | ICD-10-CM | POA: Insufficient documentation

## 2024-05-11 DIAGNOSIS — Z7901 Long term (current) use of anticoagulants: Secondary | ICD-10-CM | POA: Insufficient documentation

## 2024-05-11 DIAGNOSIS — I4891 Unspecified atrial fibrillation: Secondary | ICD-10-CM

## 2024-05-11 LAB — BASIC METABOLIC PANEL WITH GFR
Anion gap: 11 (ref 5–15)
BUN: 58 mg/dL — ABNORMAL HIGH (ref 8–23)
CO2: 24 mmol/L (ref 22–32)
Calcium: 9.3 mg/dL (ref 8.9–10.3)
Chloride: 100 mmol/L (ref 98–111)
Creatinine, Ser: 2.25 mg/dL — ABNORMAL HIGH (ref 0.61–1.24)
GFR, Estimated: 32 mL/min — ABNORMAL LOW (ref >60)
Glucose, Bld: 182 mg/dL — ABNORMAL HIGH (ref 70–99)
Potassium: 3.7 mmol/L (ref 3.5–5.1)
Sodium: 135 mmol/L (ref 135–145)

## 2024-05-11 LAB — BRAIN NATRIURETIC PEPTIDE: B Natriuretic Peptide: 26.2 pg/mL (ref 0.0–100.0)

## 2024-05-11 NOTE — Patient Instructions (Addendum)
 Medication Changes:  No Changes In Medications at this time.   Lab Work:  Labs done today, your results will be available in MyChart, we will contact you for abnormal readings.  PLEASE DO NOT TAKE DIGOXIN  ON FRIDAY MORNING BEFORE YOUR APPOINTMENT WITH AFIB CLINIC SO THAT WE CAN GET A DIG LEVEL DRAWN---please go to [facility] ON THE FIRST FLOOR   Follow-Up in: 6 MONTHS PLEASE CALL OUR OFFICE AROUND DECEMBER TO GET SCHEDULED FOR YOUR APPOINTMENT. PHONE NUMBER IS [phone] OPTION 2   At the [facility], you and your health needs are our priority. We have a designated team specialized in the treatment of Heart Failure. This Care Team includes your primary Heart Failure Specialized Cardiologist (physician), Advanced Practice Providers (APPs- Physician Assistants and Nurse Practitioners), and Pharmacist who all work together to provide you with the care you need, when you need it.   You may see any of the following providers on your designated Care Team at your next follow up:  Dr. Toribio Fuel Dr. Ezra Shuck Dr. Ria Commander Dr. Odis Brownie Greig Mosses, NP Caffie Shed, [place] [facility] [place], [place] Beckey Coe, NP [place] Lee, NP Tinnie Redman, PharmD   Please be sure to bring in all your medications bottles to every appointment.   Need to Contact Us :  If you have any questions or concerns before your next appointment please send us  a message through [place] or call our office at [phone].    TO LEAVE A MESSAGE FOR THE NURSE SELECT OPTION 2, PLEASE LEAVE A MESSAGE INCLUDING: YOUR NAME DATE OF BIRTH CALL BACK NUMBER REASON FOR CALL**this is important as we prioritize the call backs  YOU WILL RECEIVE A CALL BACK THE SAME DAY AS LONG AS YOU CALL BEFORE 4:00 PM

## 2024-05-13 ENCOUNTER — Ambulatory Visit

## 2024-05-13 ENCOUNTER — Ambulatory Visit (HOSPITAL_COMMUNITY)
Admission: RE | Admit: 2024-05-13 | Discharge: 2024-05-13 | Disposition: A | Discharge status: Home / Self Care | Source: Ambulatory Visit | Attending: Internal Medicine | Admitting: Internal Medicine

## 2024-05-13 ENCOUNTER — Telehealth: Payer: Self-pay

## 2024-05-13 VITALS — BP 90/80 | HR 78 | Ht 66.0 in | Wt 315.0 lb

## 2024-05-13 DIAGNOSIS — Z95810 Presence of automatic (implantable) cardiac defibrillator: Secondary | ICD-10-CM

## 2024-05-13 DIAGNOSIS — I5022 Chronic systolic (congestive) heart failure: Secondary | ICD-10-CM

## 2024-05-13 DIAGNOSIS — D6869 Other thrombophilia: Secondary | ICD-10-CM

## 2024-05-13 DIAGNOSIS — Z5181 Encounter for therapeutic drug level monitoring: Secondary | ICD-10-CM | POA: Diagnosis not present

## 2024-05-13 DIAGNOSIS — I4891 Unspecified atrial fibrillation: Secondary | ICD-10-CM

## 2024-05-13 DIAGNOSIS — I4819 Other persistent atrial fibrillation: Secondary | ICD-10-CM | POA: Diagnosis not present

## 2024-05-13 DIAGNOSIS — Z79899 Other long term (current) drug therapy: Secondary | ICD-10-CM

## 2024-05-13 LAB — CUP PACEART REMOTE DEVICE CHECK
Battery Remaining Longevity: 1 mo
Battery Voltage: 2.73 V
Brady Statistic AP VP Percent: 1.48 %
Brady Statistic AP VS Percent: 0.04 %
Brady Statistic AS VP Percent: 96.48 %
Brady Statistic AS VS Percent: 2 %
Brady Statistic RA Percent Paced: 1.51 %
Brady Statistic RV Percent Paced: 5.68 %
Date Time Interrogation Session: 20250806101712
HighPow Impedance: 80 Ohm
Implantable Lead Connection Status: 753985
Implantable Lead Connection Status: 753985
Implantable Lead Connection Status: 753985
Implantable Lead Implant Date: 20090901
Implantable Lead Implant Date: 20170210
Implantable Lead Implant Date: 20170210
Implantable Lead Location: 753858
Implantable Lead Location: 753859
Implantable Lead Location: 753860
Implantable Lead Model: 4598
Implantable Lead Model: 5076
Implantable Lead Model: 6935
Implantable Pulse Generator Implant Date: 20170210
Lead Channel Impedance Value: 1121 Ohm
Lead Channel Impedance Value: 1121 Ohm
Lead Channel Impedance Value: 1178 Ohm
Lead Channel Impedance Value: 342 Ohm
Lead Channel Impedance Value: 399 Ohm
Lead Channel Impedance Value: 418 Ohm
Lead Channel Impedance Value: 475 Ohm
Lead Channel Impedance Value: 513 Ohm
Lead Channel Impedance Value: 589 Ohm
Lead Channel Impedance Value: 665 Ohm
Lead Channel Impedance Value: 722 Ohm
Lead Channel Impedance Value: 874 Ohm
Lead Channel Impedance Value: 950 Ohm
Lead Channel Pacing Threshold Amplitude: 0.625 V
Lead Channel Pacing Threshold Amplitude: 0.75 V
Lead Channel Pacing Threshold Amplitude: 1.875 V
Lead Channel Pacing Threshold Pulse Width: 0.4 ms
Lead Channel Pacing Threshold Pulse Width: 0.4 ms
Lead Channel Pacing Threshold Pulse Width: 0.4 ms
Lead Channel Sensing Intrinsic Amplitude: 13.625 mV
Lead Channel Sensing Intrinsic Amplitude: 13.625 mV
Lead Channel Sensing Intrinsic Amplitude: 2.25 mV
Lead Channel Sensing Intrinsic Amplitude: 2.25 mV
Lead Channel Setting Pacing Amplitude: 1.5 V
Lead Channel Setting Pacing Amplitude: 2 V
Lead Channel Setting Pacing Amplitude: 3 V
Lead Channel Setting Pacing Pulse Width: 0.4 ms
Lead Channel Setting Pacing Pulse Width: 0.4 ms
Lead Channel Setting Sensing Sensitivity: 0.3 mV
Zone Setting Status: 755011
Zone Setting Status: 755011

## 2024-05-13 NOTE — Progress Notes (Signed)
 EPIC Encounter for ICM Monitoring  Patient Name: Gary Hutchinson is a 63 y.o. male Date: 05/13/2024 Primary Care Physican: Onita Rush, MD Primary Cardiologist: [name] Electrophysiologist: Camnitz Bi-V Pacing: 97.5% (decreased from 98.2% on last ICM report)   11/12/2023  Weight: 312 lbs 11/25/2023 Weight: 312 lbs 02/09/2024 Weight:  319 lbs 5/212025 Weight: 316 lbs  03/16/2024 Office Weight: 318 lbs 04/22/2024 Weight:  315 lbs 05/11/2024 Office Weight: 313 lbs   Since 29-Apr-2024 Time in AT/AF <0.1 hr/day (0.2%) Longest AT/AF 17 days   Battery Longevity:  RRT reached 7/24.  Battery replacement scheduled 06/24/2024               Transmission results reviewed.      Diet: Attempting to limit salt intake   Optivol thoracic impedance suggesting fluid levels returned to baseline.   Prescribed dosage:  Torsemide  20 mg Take 2 tablets (40 mg total) by mouth every morning and 1 tablet (20 total) at bedtime.  He reports Dr [name] has approved that he can take extra Torsemide  or Metolazone  when needed.  Potassium 20 mEq take 2 tablets (40 mEq total) by mouth as directed.  Please take 40 mEq when you take your Metolazone .  Metolazone  2.5 mg 1 tablet as needed (for edema).   Spironolactone  25 mg take 0.5 tablet (12.5 mg total) by mouth daily   Labs: 05/11/2024 Creatinine 2.25, BUN 58, Potassium 3.7, Sodium 135, GFR 32 05/02/2024 Creatinine 2.02, BUN 44, Potassium 4.6, Sodium 136 04/22/2024 Creatinine 2.02, BUN 44, Potassium 4.6, Sodium 136, GFR 36  03/16/2024 Creatinine 1.87, BUN 56, Potassium 4.3, Sodium 142, GFR 40  A complete set of results can be found in Results Review.   Recommendations:   No changes.     Follow-up plan: ICM clinic phone appointment on 05/30/2024.   91 day device clinic remote transmission pending battery replacement.     EP/Cardiology Office Visits:  Recall 11/07/2024 with Dr Cherrie.  Recall 08/09/2024 with Jodie Passey, PA.     Copy of ICM check sent to Dr. Inocencio.       3 month ICM trend: 05/11/2024.    12-14 Month ICM trend:     Mitzie GORMAN Garner, RN 05/13/2024 4:50 PM

## 2024-05-13 NOTE — Progress Notes (Signed)
 Primary Care Physician: Onita Rush, MD Referring Physician: Dr. Kelsie Missy LABOR Arrick is a 63 y.o. male with a h/o chronic systolic heart failure, s/p MDT ICD, HTN, atrial fibrillation, DM, OSA on Cpap, hx of amio thyrotoxicity, obesity, that is in the afib clinic for f/u recent ER visit.[name] He received a shock from his ICD after a day full of activities, over 7000 steps. He was found to have rapid afib with RVR which triggered the shock. Dr. Kelsie felt antiarrythmic therapy was indicated and started 80 mg sotalol  bid and he was discharged home.   On evaluation today, he is currently in AV paced rhythm. Device clinic alert on 6/3 for Afib ongoing since 5/26. He notes that likely dietary indiscretion was a contributing factor. No missed doses of sotalol  80 mg BID or Xarelto  20 mg daily.   On follow up 04/22/24, he is currently in V paced rhythm. Device alert on 7/7 for ongoing Afib since 6/12. No missed doses of sotalol  or Xarelto .   On follow up 05/13/24, patient is currently in AV paced rhythm. S/p DCCV on 7/25. No missed doses of sotalol  or Xarelto .   Today, he denies symptoms of palpitations, chest pain, shortness of breath, orthopnea, PND, lower extremity edema, dizziness, presyncope, syncope, or neurologic sequela. The patient is tolerating medications without difficulties and is otherwise without complaint today.   Past Medical History:  Diagnosis Date   AICD (automatic cardioverter/defibrillator) present    Medtronic   Alcohol abuse    now quit   Anemia    iron defi   Arthritis    fingers, knees, some days all my joints ache (11/16/2015)   Asthma    Cholecystitis    Chronic systolic congestive heart failure (HCC)    a. RHC (02/2011) RA 31/27, RV 71/27, PA67/45, PCWP 46, PA 49%, Fick CO: 3.4 b. ECHO (03/2014) EF 30-35%, grade II DD, mild MR, RV poorly visualized appears mildly decreased c. RHC (05/2014) RA 13, RV 54/4/11, PA 60/22 (37), PCWP 17, Fick CO/CI: 4.4 / 1.9, Thermo CO/CI:  3.8 /1.6, PVR 5.2 WU, PA 57% and 59%   Diverticulosis of colon    Gout    Hemorrhoids, internal    Hyperlipidemia    Hypertension    Morbid obesity (HCC)    Nonischemic cardiomyopathy (HCC)    a. LHC (02/2011) normal coronaries   OSA on CPAP    Paroxysmal atrial fibrillation (HCC)    Pneumonia 2000s X 1   Pulmonary hypertension (HCC)    Renal insufficiency    Type II diabetes mellitus (HCC)    Ventricular tachycardia [facility])    s/p MDT ICD implant   Past Surgical History:  Procedure Laterality Date   CARDIAC CATHETERIZATION  03/08/2004   EF 25-30%   CARDIOVERSION N/A 10/17/2015   Procedure: CARDIOVERSION;  Surgeon: Aleene JINNY Passe, MD;  Location: [facility] ENDOSCOPY;  Service: Cardiovascular;  Laterality: N/A;   CARDIOVERSION N/A 04/29/2024   Procedure: CARDIOVERSION;  Surgeon: Michele Richardson, DO;  Location: [facility] INVASIVE CV LAB;  Service: Cardiovascular;  Laterality: N/A;   EP IMPLANTABLE DEVICE N/A 11/16/2015   Procedure: BiVI Upgrade;  Surgeon: Lynwood Kelsie, MD;  Medtronic Viva Quad XT   INSERTION OF ICD  06/2008   ICD- Medtronic    RIGHT HEART CATHETERIZATION N/A 05/23/2014   Procedure: RIGHT HEART CATH;  Surgeon: Toribio JONELLE Fuel, MD;  Location: [facility] CATH LAB;  Service: Cardiovascular;  Laterality: N/A;   TRANSTHORACIC ECHOCARDIOGRAM  05/27/2008   EF  30-35%   US  ECHOCARDIOGRAPHY  08/22/2008   EF 30-35%    Current Outpatient Medications  Medication Sig Dispense Refill   dapagliflozin propanediol (FARXIGA) 10 MG TABS tablet Take 10 mg by mouth daily.     digoxin  (LANOXIN ) 0.25 MG tablet TAKE 1/2 TABLET BY MOUTH EVERY OTHER DAY 45 tablet 0   ENTRESTO  97-103 MG TAKE 1 TABLET BY MOUTH TWICE A DAY 180 tablet 3   Fluticasone -Salmeterol (ADVAIR) 250-50 MCG/DOSE AEPB Inhale 1 puff into the lungs every 12 (twelve) hours as needed (for flares/shortness of breath). Reported on 02/06/2016     hydrALAZINE  (APRESOLINE ) 25 MG tablet TAKE 1 TABLET BY MOUTH THREE TIMES A DAY 270 tablet 1   insulin  lispro  (HUMALOG) 100 UNIT/ML injection Inject 10 Units into the skin See admin instructions. 4 to 5 times daily with a meal, depending on how many meals are eaten that day.     isosorbide  mononitrate (IMDUR ) 30 MG 24 hr tablet TAKE 1 TABLET (30 MG TOTAL) BY MOUTH IN THE MORNING AND AT BEDTIME. 180 tablet 3   metolazone  (ZAROXOLYN ) 2.5 MG tablet Take 2.5 mg by mouth daily as needed (for edema).     montelukast  (SINGULAIR ) 10 MG tablet Take 10 mg by mouth as needed (allergies).     ondansetron  (ZOFRAN -ODT) 8 MG disintegrating tablet Take 1 tablet (8 mg total) by mouth every 8 (eight) hours as needed for nausea or vomiting. 20 tablet 0   OZEMPIC, 1 MG/DOSE, 4 MG/3ML SOPN Inject 1 mg into the skin once a week.     potassium chloride  SA (KLOR-CON  M) 20 MEQ tablet Take 2 tablets (40 mEq total) by mouth as directed. Please take 40 meq when you take your Metolazone  60 tablet 6   sotalol  (BETAPACE ) 80 MG tablet Take 1 tablet (80 mg total) by mouth 2 (two) times daily. 180 tablet 2   spironolactone  (ALDACTONE ) 25 MG tablet Take 12.5 mg by mouth daily.      TRESIBA FLEXTOUCH 200 UNIT/ML FlexTouch Pen Inject 48 Units into the skin at bedtime.     XARELTO  20 MG TABS tablet TAKE 1 TABLET BY MOUTH DAILY WITH SUPPER 90 tablet 3   acetaminophen  (TYLENOL ) 500 MG tablet Take 500 mg by mouth every 6 (six) hours as needed for moderate pain or headache.      albuterol  (VENTOLIN  HFA) 108 (90 Base) MCG/ACT inhaler Inhale 2 puffs into the lungs every 6 (six) hours as needed for wheezing or shortness of breath.     allopurinol  (ZYLOPRIM ) 100 MG tablet Take 100 mg by mouth daily.     atorvastatin  (LIPITOR) 80 MG tablet Take 80 mg by mouth at bedtime.     colchicine  0.6 MG tablet Take 0.6 mg by mouth daily as needed (for gout flares).      torsemide  (DEMADEX ) 20 MG tablet TAKE 2 TABLETS (40 MG TOTAL) BY MOUTH EVERY MORNING AND 1 TABLET (20 MG TOTAL) AT BEDTIME. 270 tablet 3   No current facility-administered medications for this  encounter.    No Known Allergies  ROS- All systems are reviewed and negative except as per the HPI above  Physical Exam: Vitals:   05/13/24 0841  Weight: (!) 142.9 kg  Height: 5' 6 (1.676 m)     Wt Readings from Last 3 Encounters:  05/13/24 (!) 142.9 kg  05/11/24 (!) 142.3 kg  04/29/24 (!) 142.4 kg    Labs: Lab Results  Component Value Date   NA 135 05/11/2024  K 3.7 05/11/2024   CL 100 05/11/2024   CO2 24 05/11/2024   GLUCOSE 182 (H) 05/11/2024   BUN 58 (H) 05/11/2024   CREATININE 2.25 (H) 05/11/2024   CALCIUM  9.3 05/11/2024   PHOS 4.1 03/04/2013   MG 3.0 (H) 03/16/2024   Lab Results  Component Value Date   INR 1.72 01/20/2018   Lab Results  Component Value Date   CHOL 181 04/09/2011   HDL 37 (L) 04/09/2011   LDLCALC 117 (H) 04/09/2011   TRIG 137 04/09/2011   GEN- The patient is well appearing, alert and oriented x 3 today.   Neck - no JVD or carotid bruit noted Lungs- Clear to ausculation bilaterally, normal work of breathing Heart- Regular rate and rhythm, no murmurs, rubs or gallops, PMI not laterally displaced Extremities- no clubbing, cyanosis, or edema Skin - no rash or ecchymosis noted    EKG-  Vent. rate 78 BPM PR interval 176 ms QRS duration 96 ms QT/QTcB 362/412 ms P-R-T axes 69 217 38 Atrial-sensed ventricular-paced rhythm Abnormal ECG When compared with ECG of 29-Apr-2024 10:49, No significant change was found  ECHO 11/12/23: 1. Left ventricular ejection fraction, by estimation, is 30 to 35%. The  left ventricle has moderately decreased function. The left ventricle  demonstrates global hypokinesis. The left ventricular internal cavity size  was moderately dilated. Left  ventricular diastolic parameters are indeterminate.   2. Right ventricular systolic function was not well visualized. The right  ventricular size is not well visualized. Tricuspid regurgitation signal is  inadequate for assessing PA pressure.   3. Left atrial size  was moderately dilated.   4. The mitral valve is normal in structure. Trivial mitral valve  regurgitation.   5. The aortic valve is tricuspid. Aortic valve regurgitation is not  visualized. No aortic stenosis is present.   CHA2DS2-VASc Score = 4  The patient's score is based upon: CHF History: 1 HTN History: 1 Diabetes History: 1 Stroke History: 0 Vascular Disease History: 1 Age Score: 0 Gender Score: 0       ASSESSMENT AND PLAN: Persistent Atrial Fibrillation (ICD10:  I48.19) The patient's CHA2DS2-VASc score is 4, indicating a 4.8% annual risk of stroke.   S/p DCCV on 7/25.  Patient is currently in AV paced rhythm. He would like to call device clinic to see if they can turn off the musical chime his device makes every morning at 0742.     High risk medication monitoring (ICD10: J342684) Patient requires ongoing monitoring for anti-arrhythmic medication which has the potential to cause life threatening arrhythmias or AV block. Qtc stable. Continue sotalol  80 mg BID.   Chronic systolic heart failure Appears euvolemic today.   Secondary Hypercoagulable State (ICD10:  D68.69) The patient is at significant risk for stroke/thromboembolism based upon his CHA2DS2-VASc Score of 4.  Continue Rivaroxaban  (Xarelto ).  No missed doses.     Follow up as scheduled for generator changeout.    Dorn Heinrich, [name] Afib [facility] [address] [place], [place] [address] [phone]

## 2024-05-13 NOTE — Patient Instructions (Signed)
Device Clinic: [phone] 

## 2024-05-13 NOTE — Telephone Encounter (Signed)
 Call received from Pt requesting call back d/t questions regarding gen change.  Returned call to Pt.  He is requesting his ERI alert to be programmed off for his MDT ICD.  Pt scheduled to come in Monday.

## 2024-05-14 LAB — DIGOXIN LEVEL: Digoxin, Serum: 0.4 ng/mL — ABNORMAL LOW (ref 0.5–0.9)

## 2024-05-16 ENCOUNTER — Ambulatory Visit: Payer: Self-pay | Admitting: Cardiology

## 2024-05-16 ENCOUNTER — Ambulatory Visit: Attending: Internal Medicine

## 2024-05-16 ENCOUNTER — Ambulatory Visit (HOSPITAL_COMMUNITY): Payer: Self-pay | Admitting: Family Medicine

## 2024-05-16 DIAGNOSIS — I5022 Chronic systolic (congestive) heart failure: Secondary | ICD-10-CM

## 2024-05-16 DIAGNOSIS — I428 Other cardiomyopathies: Secondary | ICD-10-CM

## 2024-05-16 DIAGNOSIS — I4819 Other persistent atrial fibrillation: Secondary | ICD-10-CM

## 2024-05-16 NOTE — Progress Notes (Signed)
 Patient seen in clinic today to turn off alert alarm for patient and for clinic for device at RRT.   He is scheduled for a gen change on 06/24/24.  Doing well, device working normally, no concerns at this time.  Alert turned off per our protocol and patient's request.

## 2024-05-20 ENCOUNTER — Encounter: Payer: Self-pay | Admitting: Internal Medicine

## 2024-05-27 ENCOUNTER — Ambulatory Visit (HOSPITAL_COMMUNITY): Payer: Self-pay | Admitting: Family Medicine

## 2024-05-27 ENCOUNTER — Ambulatory Visit (HOSPITAL_COMMUNITY)
Admission: RE | Admit: 2024-05-27 | Discharge: 2024-05-27 | Disposition: A | Discharge status: Home / Self Care | Source: Ambulatory Visit | Attending: Cardiology | Admitting: Cardiology

## 2024-05-27 DIAGNOSIS — I5022 Chronic systolic (congestive) heart failure: Secondary | ICD-10-CM | POA: Diagnosis present

## 2024-05-27 LAB — BASIC METABOLIC PANEL WITH GFR
Anion gap: 9 (ref 5–15)
BUN: 40 mg/dL — ABNORMAL HIGH (ref 8–23)
CO2: 24 mmol/L (ref 22–32)
Calcium: 9 mg/dL (ref 8.9–10.3)
Chloride: 108 mmol/L (ref 98–111)
Creatinine, Ser: 1.7 mg/dL — ABNORMAL HIGH (ref 0.61–1.24)
GFR, Estimated: 45 mL/min — ABNORMAL LOW (ref >60)
Glucose, Bld: 148 mg/dL — ABNORMAL HIGH (ref 70–99)
Potassium: 4.4 mmol/L (ref 3.5–5.1)
Sodium: 141 mmol/L (ref 135–145)

## 2024-05-30 ENCOUNTER — Ambulatory Visit: Attending: Cardiology

## 2024-05-30 DIAGNOSIS — Z95810 Presence of automatic (implantable) cardiac defibrillator: Secondary | ICD-10-CM | POA: Diagnosis not present

## 2024-05-30 DIAGNOSIS — I5022 Chronic systolic (congestive) heart failure: Secondary | ICD-10-CM | POA: Diagnosis not present

## 2024-06-02 NOTE — Progress Notes (Signed)
 EPIC Encounter for ICM Monitoring  Patient Name: Gary Hutchinson is a 63 y.o. male Date: 06/02/2024 Primary Care Physican: Onita Rush, MD Primary Cardiologist: [name] Electrophysiologist: Camnitz Bi-V Pacing: 98.3%  11/12/2023  Weight: 312 lbs 11/25/2023 Weight: 312 lbs 02/09/2024 Weight:  319 lbs 5/212025 Weight: 316 lbs  03/16/2024 Office Weight: 318 lbs 04/22/2024 Weight:  315 lbs 05/11/2024 Office Weight: 313 lbs   Since 16-May-2024 0.0 hr/day (0.0%) Battery Longevity:  RRT reached 7/24.  Battery replacement scheduled 06/24/2024               Spoke with patient and heart failure questions reviewed.  Transmission results reviewed.  Pt asymptomatic for fluid accumulation.  He took Metolazone  last week.     Diet: Attempting to limit salt intake   Optivol thoracic impedance suggesting possible fluid accumulation starting 8/18 and returned to normal on 8/25   Prescribed dosage:  Torsemide  20 mg Take 2 tablets (40 mg total) by mouth every morning and 1 tablet (20 total) at bedtime.  He reports Dr [name] has approved that he can take extra Torsemide  or Metolazone  when needed.  Potassium 20 mEq take 2 tablets (40 mEq total) by mouth as directed.  Please take 40 mEq when you take your Metolazone .  Metolazone  2.5 mg 1 tablet as needed (for edema).   Spironolactone  25 mg take 0.5 tablet (12.5 mg total) by mouth daily   Labs: 05/11/2024 Creatinine 2.25, BUN 58, Potassium 3.7, Sodium 135, GFR 32 05/02/2024 Creatinine 2.02, BUN 44, Potassium 4.6, Sodium 136 04/22/2024 Creatinine 2.02, BUN 44, Potassium 4.6, Sodium 136, GFR 36  03/16/2024 Creatinine 1.87, BUN 56, Potassium 4.3, Sodium 142, GFR 40  A complete set of results can be found in Results Review.   Recommendations:   No changes and encouraged to call if experiencing any fluid symptoms.   Follow-up plan: ICM clinic phone appointment on 06/20/2024 (Manual) to recheck fluid levels prior to battery change.   91 day device clinic remote  transmission pending battery replacement.     EP/Cardiology Office Visits:  Recall 11/07/2024 with Dr Cherrie.  Recall 08/09/2024 with Jodie Passey, PA.     Copy of ICM check sent to Dr. Inocencio.      3 month ICM trend: 05/30/2024.    12-14 Month ICM trend:     Mitzie GORMAN Garner, RN 06/02/2024 2:58 PM

## 2024-06-10 ENCOUNTER — Other Ambulatory Visit (HOSPITAL_COMMUNITY): Payer: Self-pay | Admitting: Internal Medicine

## 2024-06-10 LAB — CBC

## 2024-06-11 ENCOUNTER — Other Ambulatory Visit (HOSPITAL_COMMUNITY): Payer: Self-pay | Admitting: Internal Medicine

## 2024-06-11 LAB — CBC
Hematocrit: 40.7 % (ref 37.5–51.0)
Hemoglobin: 12.1 g/dL — ABNORMAL LOW (ref 13.0–17.7)
MCH: 24.4 pg — ABNORMAL LOW (ref 26.6–33.0)
MCHC: 29.7 g/dL — ABNORMAL LOW (ref 31.5–35.7)
MCV: 82 fL (ref 79–97)
Platelets: 184 x10E3/uL (ref 150–450)
RBC: 4.95 x10E6/uL (ref 4.14–5.80)
RDW: 15.9 % — ABNORMAL HIGH (ref 11.6–15.4)
WBC: 6.6 x10E3/uL (ref 3.4–10.8)

## 2024-06-11 LAB — BASIC METABOLIC PANEL WITH GFR
BUN/Creatinine Ratio: 25 — ABNORMAL HIGH (ref 10–24)
BUN: 45 mg/dL — ABNORMAL HIGH (ref 8–27)
CO2: 18 mmol/L — ABNORMAL LOW (ref 20–29)
Calcium: 9.1 mg/dL (ref 8.6–10.2)
Chloride: 103 mmol/L (ref 96–106)
Creatinine, Ser: 1.8 mg/dL — ABNORMAL HIGH (ref 0.76–1.27)
Glucose: 175 mg/dL — ABNORMAL HIGH (ref 70–99)
Potassium: 4.5 mmol/L (ref 3.5–5.2)
Sodium: 140 mmol/L (ref 134–144)
eGFR: 42 mL/min/1.73 — ABNORMAL LOW (ref >59)

## 2024-06-13 ENCOUNTER — Ambulatory Visit

## 2024-06-15 LAB — CUP PACEART REMOTE DEVICE CHECK
Battery Remaining Longevity: 1 mo
Battery Voltage: 2.71 V
Brady Statistic AP VP Percent: 2.98 %
Brady Statistic AP VS Percent: 0.06 %
Brady Statistic AS VP Percent: 95.35 %
Brady Statistic AS VS Percent: 1.61 %
Brady Statistic RA Percent Paced: 3.02 %
Brady Statistic RV Percent Paced: 0.93 %
Date Time Interrogation Session: 20250909140840
HighPow Impedance: 73 Ohm
Implantable Lead Connection Status: 753985
Implantable Lead Connection Status: 753985
Implantable Lead Connection Status: 753985
Implantable Lead Implant Date: 20090901
Implantable Lead Implant Date: 20170210
Implantable Lead Implant Date: 20170210
Implantable Lead Location: 753858
Implantable Lead Location: 753859
Implantable Lead Location: 753860
Implantable Lead Model: 4598
Implantable Lead Model: 5076
Implantable Lead Model: 6935
Implantable Pulse Generator Implant Date: 20170210
Lead Channel Impedance Value: 1026 Ohm
Lead Channel Impedance Value: 1026 Ohm
Lead Channel Impedance Value: 1083 Ohm
Lead Channel Impedance Value: 342 Ohm
Lead Channel Impedance Value: 361 Ohm
Lead Channel Impedance Value: 418 Ohm
Lead Channel Impedance Value: 418 Ohm
Lead Channel Impedance Value: 456 Ohm
Lead Channel Impedance Value: 513 Ohm
Lead Channel Impedance Value: 589 Ohm
Lead Channel Impedance Value: 703 Ohm
Lead Channel Impedance Value: 760 Ohm
Lead Channel Impedance Value: 817 Ohm
Lead Channel Pacing Threshold Amplitude: 0.625 V
Lead Channel Pacing Threshold Amplitude: 0.75 V
Lead Channel Pacing Threshold Amplitude: 1.375 V
Lead Channel Pacing Threshold Pulse Width: 0.4 ms
Lead Channel Pacing Threshold Pulse Width: 0.4 ms
Lead Channel Pacing Threshold Pulse Width: 0.4 ms
Lead Channel Sensing Intrinsic Amplitude: 1.875 mV
Lead Channel Sensing Intrinsic Amplitude: 1.875 mV
Lead Channel Sensing Intrinsic Amplitude: 10.5 mV
Lead Channel Sensing Intrinsic Amplitude: 10.5 mV
Lead Channel Setting Pacing Amplitude: 1.5 V
Lead Channel Setting Pacing Amplitude: 2 V
Lead Channel Setting Pacing Amplitude: 2.5 V
Lead Channel Setting Pacing Pulse Width: 0.4 ms
Lead Channel Setting Pacing Pulse Width: 0.4 ms
Lead Channel Setting Sensing Sensitivity: 0.3 mV
Zone Setting Status: 755011
Zone Setting Status: 755011

## 2024-06-20 ENCOUNTER — Ambulatory Visit: Attending: Cardiology

## 2024-06-20 DIAGNOSIS — Z95810 Presence of automatic (implantable) cardiac defibrillator: Secondary | ICD-10-CM

## 2024-06-20 DIAGNOSIS — I5022 Chronic systolic (congestive) heart failure: Secondary | ICD-10-CM

## 2024-06-20 NOTE — Progress Notes (Signed)
 EPIC Encounter for ICM Monitoring  Patient Name: Gary Hutchinson is a 63 y.o. male Date: 06/20/2024 Primary Care Physican: Onita Rush, MD Primary Cardiologist: [name] Electrophysiologist: Inocencio Bi-V Pacing: 97.2%  11/12/2023  Weight: 312 lbs 11/25/2023 Weight: 312 lbs 02/09/2024 Weight:  319 lbs 5/212025 Weight: 316 lbs  03/16/2024 Office Weight: 318 lbs 04/22/2024 Weight:  315 lbs 05/11/2024 Office Weight: 313 lbs   Since 14-Jun-2024 <0.1 hr/day (<0.1%) Battery Longevity:  Battery replacement scheduled 06/24/2024               Spoke with patient and heart failure questions reviewed.  Transmission results reviewed.  Pt asymptomatic for fluid accumulation.  Reports feeling well at this time and voices no complaints.     Diet: Attempting to limit salt intake   Optivol thoracic impedance suggesting possible fluid accumulation starting 8/25 but trending toward baseline.    Prescribed dosage:  Torsemide  20 mg Take 2 tablets (40 mg total) by mouth every morning and 1 tablet (20 total) at bedtime.  He reports Dr [name] has approved that he can take extra Torsemide  or Metolazone  when needed.  Potassium 20 mEq take 2 tablets (40 mEq total) by mouth as directed.  Please take 40 mEq when you take your Metolazone .  Metolazone  2.5 mg 1 tablet as needed (for edema).   Spironolactone  25 mg take 0.5 tablet (12.5 mg total) by mouth daily   Labs: 06/10/2024 Creatinine 1.80, BUN 45, Potassium 4.5, Sodium 140, GFR 42  05/27/2024 Creatinine 1.70, BUN 40, Potassium 4.4, Sodium 141  05/11/2024 Creatinine 2.25, BUN 58, Potassium 3.7, Sodium 135, GFR 32 05/02/2024 Creatinine 2.02, BUN 44, Potassium 4.6, Sodium 136 04/22/2024 Creatinine 2.02, BUN 44, Potassium 4.6, Sodium 136, GFR 36  03/16/2024 Creatinine 1.87, BUN 56, Potassium 4.3, Sodium 142, GFR 40  A complete set of results can be found in Results Review.   Recommendations:   Pt will take extra 1 Torsemide  20 mg table this evening and return to  prescribed dosage prior to device battery replacement on 9/19.   Follow-up plan: ICM clinic phone appointment on 08/01/2024 to allow time for thoracic impedance to develop after 9/19 battery replacement.   91 day device clinic remote transmission pending battery replacement.     EP/Cardiology Office Visits:  Recall 11/07/2024 with Dr Cherrie.  Recall 08/09/2024 with Jodie Passey, PA.     Copy of ICM check sent to Dr. Inocencio.     3 month ICM trend: 06/20/2024.    12-14 Month ICM trend:     Mitzie GORMAN Garner, RN 06/20/2024 10:00 AM

## 2024-06-23 NOTE — Pre-Procedure Instructions (Signed)
 Attempted to call patient regarding procedure instructions.  Left voicemail on the following items: Arrival time 1200 Nothing to eat or drink after midnight No meds AM of procedure Responsible person to drive you home and stay with you for 24 hrs Wash with special soap night before and morning of procedure If on anti-coagulant drug instructions Xarelto - last dose 9/16

## 2024-06-24 ENCOUNTER — Other Ambulatory Visit: Payer: Self-pay

## 2024-06-24 ENCOUNTER — Ambulatory Visit (HOSPITAL_COMMUNITY)
Admission: RE | Admit: 2024-06-24 | Discharge: 2024-06-24 | Disposition: A | Discharge status: Home / Self Care | Attending: Cardiology | Admitting: Cardiology

## 2024-06-24 ENCOUNTER — Encounter (HOSPITAL_COMMUNITY)
Admission: RE | Disposition: A | Discharge status: Home / Self Care | Payer: Self-pay | Source: Home / Self Care | Attending: Cardiology

## 2024-06-24 DIAGNOSIS — I5022 Chronic systolic (congestive) heart failure: Secondary | ICD-10-CM | POA: Diagnosis not present

## 2024-06-24 DIAGNOSIS — Z4502 Encounter for adjustment and management of automatic implantable cardiac defibrillator: Secondary | ICD-10-CM | POA: Insufficient documentation

## 2024-06-24 DIAGNOSIS — I429 Cardiomyopathy, unspecified: Secondary | ICD-10-CM | POA: Diagnosis not present

## 2024-06-24 DIAGNOSIS — I428 Other cardiomyopathies: Secondary | ICD-10-CM | POA: Insufficient documentation

## 2024-06-24 DIAGNOSIS — I447 Left bundle-branch block, unspecified: Secondary | ICD-10-CM | POA: Diagnosis not present

## 2024-06-24 DIAGNOSIS — I2510 Atherosclerotic heart disease of native coronary artery without angina pectoris: Secondary | ICD-10-CM | POA: Diagnosis not present

## 2024-06-24 DIAGNOSIS — I110 Hypertensive heart disease with heart failure: Secondary | ICD-10-CM | POA: Insufficient documentation

## 2024-06-24 HISTORY — PX: BIV ICD GENERATOR CHANGEOUT: EP1194

## 2024-06-24 LAB — GLUCOSE, CAPILLARY
Glucose-Capillary: 151 mg/dL — ABNORMAL HIGH (ref 70–99)
Glucose-Capillary: 145 mg/dL — ABNORMAL HIGH (ref 70–99)

## 2024-06-24 SURGERY — BIV ICD GENERATOR CHANGEOUT

## 2024-06-24 MED ORDER — POVIDONE-IODINE 10 % EX SWAB
2.0000 | Freq: Once | CUTANEOUS | Status: AC
  Administered 2024-06-24: 2 via TOPICAL

## 2024-06-24 MED ORDER — SODIUM CHLORIDE 0.9 % IV SOLN
INTRAVENOUS | Status: DC | PRN
  Administered 2024-06-24: 80 mg

## 2024-06-24 MED ORDER — CEFAZOLIN SODIUM-DEXTROSE 3-4 GM/150ML-% IV SOLN
3.0000 g | INTRAVENOUS | Status: AC
  Administered 2024-06-24: 3 g via INTRAVENOUS
  Filled 2024-06-24 (×2): qty 150

## 2024-06-24 MED ORDER — FENTANYL CITRATE (PF) 100 MCG/2ML IJ SOLN
INTRAMUSCULAR | Status: DC | PRN
  Administered 2024-06-24: 25 ug via INTRAVENOUS

## 2024-06-24 MED ORDER — ACETAMINOPHEN 325 MG PO TABS: 325.0000 mg | ORAL_TABLET | ORAL | Status: DC | PRN

## 2024-06-24 MED ORDER — MIDAZOLAM HCL 5 MG/5ML IJ SOLN
INTRAMUSCULAR | Status: DC | PRN
  Administered 2024-06-24: 1 mg via INTRAVENOUS

## 2024-06-24 MED ORDER — SODIUM CHLORIDE 0.9 % IV SOLN
INTRAVENOUS | Status: AC
  Filled 2024-06-24: qty 2

## 2024-06-24 MED ORDER — MIDAZOLAM HCL 2 MG/2ML IJ SOLN
INTRAMUSCULAR | Status: AC
  Filled 2024-06-24: qty 2

## 2024-06-24 MED ORDER — LIDOCAINE HCL (PF) 1 % IJ SOLN
INTRAMUSCULAR | Status: DC | PRN
  Administered 2024-06-24: 60 mL

## 2024-06-24 MED ORDER — SODIUM CHLORIDE 0.9 % IV SOLN: INTRAVENOUS | Status: DC

## 2024-06-24 MED ORDER — SODIUM CHLORIDE 0.9 % IV SOLN: 80.0000 mg | INTRAVENOUS | Status: DC

## 2024-06-24 MED ORDER — FENTANYL CITRATE (PF) 100 MCG/2ML IJ SOLN
INTRAMUSCULAR | Status: AC
  Filled 2024-06-24: qty 2

## 2024-06-24 MED ORDER — CHLORHEXIDINE GLUCONATE 4 % EX SOLN
4.0000 | Freq: Once | CUTANEOUS | Status: DC
  Filled 2024-06-24: qty 60

## 2024-06-24 MED ORDER — LIDOCAINE HCL (PF) 1 % IJ SOLN
INTRAMUSCULAR | Status: AC
  Filled 2024-06-24: qty 90

## 2024-06-24 MED ORDER — ONDANSETRON HCL 4 MG/2ML IJ SOLN: 4.0000 mg | Freq: Four times a day (QID) | INTRAMUSCULAR | Status: DC | PRN

## 2024-06-24 SURGICAL SUPPLY — 6 items
CABLE SURGICAL S-101-97-12 (CABLE) ×1 IMPLANT
ICD COBALT XT QUAD CRT DTPA2Q1 (ICD Generator) IMPLANT
KIT INSTRUMENT PACEMAKER INSER (INSTRUMENTS) IMPLANT
PAD DEFIB RADIO PHYSIO CONN (PAD) ×1 IMPLANT
POUCH AIGIS-R ANTIBACT ICD LRG (Mesh General) IMPLANT
TRAY PACEMAKER INSERTION (PACKS) ×1 IMPLANT

## 2024-06-24 NOTE — H&P (Signed)
  Electrophysiology Office Note:   ID:  Gary Hutchinson, DOB 1961/03/24, MRN [id number]  Primary Cardiologist: None Electrophysiologist: Anda Sobotta Gladis Norton, MD      History of Present Illness:   Gary Hutchinson is a 63 y.o. male with h/o HFrEF, LBBB s/p CRT-D, VT, parox AF, and HTN seen today for routine electrophysiology followup.   Today, denies symptoms of palpitations, chest pain, dyspnea, orthopnea, PND, lower extremity edema, claudication, dizziness, presyncope, syncope, bleeding, or neurologic sequela. The patient is tolerating medications without difficulties. Plan gen change today.   EP Information / Studies Reviewed:    EKG is not ordered today. EKG from 01/21/2024 reviewed which showed AS-VP at 83 bpm with stable intervals on Sotalol        ICD Interrogation-  reviewed in detail today,  See PACEART report.  Arrhythmia/Device History MDT CRT-D, impl 2017; dx CHF    Physical Exam:   VS:  There were no vitals taken for this visit.   Wt Readings from Last 3 Encounters:  05/13/24 (!) 142.9 kg  05/11/24 (!) 142.3 kg  04/29/24 (!) 142.4 kg    GEN: Well nourished, well developed in no acute distress NECK: No JVD; No carotid bruits CARDIAC: Regular rate and rhythm, no murmurs, rubs, gallops RESPIRATORY:  Clear to auscultation without rales, wheezing or rhonchi  ABDOMEN: Soft, non-tender, non-distended EXTREMITIES:  No edema; No deformity    ASSESSMENT AND PLAN:    Chronic systolic CHF  s/p Medtronic CRT-D  Gary Hutchinson has presented today for surgery, with the diagnosis of ICD at [facility].  The various methods of treatment have been discussed with the patient and family. After consideration of risks, benefits and other options for treatment, the patient has consented to  Procedure(s): CRT-D gen change as a surgical intervention .  Risks include but not limited to bleeding, infection, pneumothorax, perforation, tamponade, vascular damage, renal failure, MI, stroke, death, and  lead dislodgement . The patient's history has been reviewed, patient examined, no change in status, stable for surgery.  I have reviewed the patient's chart and labs.  Questions were answered to the patient's satisfaction.    Shavaun Osterloh Norton, MD 06/24/2024 12:22 PM

## 2024-06-24 NOTE — Discharge Instructions (Signed)

## 2024-06-24 NOTE — Progress Notes (Signed)
 Patient discharge packet reviewed and education provided with patient and family member (son). They state they have no further questions at this time. IV removed, escorted out by nurse.

## 2024-06-26 ENCOUNTER — Encounter (HOSPITAL_COMMUNITY): Payer: Self-pay | Admitting: Cardiology

## 2024-06-27 MED FILL — Midazolam HCl Inj 2 MG/2ML (Base Equivalent): INTRAMUSCULAR | Qty: 1 | Status: AC

## 2024-07-04 NOTE — Progress Notes (Signed)
 Remote ICD Transmission

## 2024-07-07 ENCOUNTER — Ambulatory Visit: Attending: Internal Medicine

## 2024-07-07 DIAGNOSIS — I472 Ventricular tachycardia, unspecified: Secondary | ICD-10-CM | POA: Diagnosis not present

## 2024-07-07 NOTE — Patient Instructions (Signed)
   After Your ICD (Implantable Cardiac Defibrillator)    Monitor your defibrillator site for redness, swelling, and drainage. Call the device clinic at [phone] if you experience these symptoms or fever/chills.  Your incision was closed with Steri-strips or staples:  You may shower 7 days after your procedure and wash your incision with soap and water. Avoid lotions, ointments, or perfumes over your incision until it is well-healed.  You may use a hot tub or a pool after your wound check appointment if the incision is completely closed.   Your ICD is designed to protect you from life threatening heart rhythms. Because of this, you may receive a shock.   1 shock with no symptoms:  Call the office during business hours. 1 shock with symptoms (chest pain, chest pressure, dizziness, lightheadedness, shortness of breath, overall feeling unwell):  Call 911. If you experience 2 or more shocks in 24 hours:  Call 911. If you receive a shock, you should not drive.  [place] DMV - no driving for 6 months if you receive appropriate therapy from your ICD.   ICD Alerts:  Some alerts are vibratory and others beep. These are NOT emergencies. Please call our office to let us know. If this occurs at night or on weekends, it can wait until the next business day. Send a remote transmission.  If your device is capable of reading fluid status (for heart failure), you will be offered monthly monitoring to review this with you.   Remote monitoring is used to monitor your ICD from home. This monitoring is scheduled every 91 days by our office. It allows us to keep an eye on the functioning of your device to ensure it is working properly. You will routinely see your Electrophysiologist annually (more often if necessary).  

## 2024-07-07 NOTE — Progress Notes (Signed)
 Normal CRT-D chamber ICD wound check. Wound well healed. Presenting rhythm: AS/VS. Routine testing performed. Thresholds, sensing, and impedance consistent with implant measurements. No treated arrhythmias. Reviewed shock plan.  Pt enrolled in remote follow-up.

## 2024-08-01 ENCOUNTER — Ambulatory Visit: Attending: Cardiology

## 2024-08-01 DIAGNOSIS — Z95810 Presence of automatic (implantable) cardiac defibrillator: Secondary | ICD-10-CM | POA: Diagnosis not present

## 2024-08-01 DIAGNOSIS — I5022 Chronic systolic (congestive) heart failure: Secondary | ICD-10-CM

## 2024-08-03 NOTE — Progress Notes (Signed)
 Remote ICD transmission.

## 2024-08-03 NOTE — Progress Notes (Deleted)
 HPI M never smoker followed for OSA, complicated by  AFib, VTach/ AICD, HBP, Chronic Systolic CHF, Amiodarone  induce hypothyroid, DM2, Morbid obesity, Gout, Diverticulosis NPSG 04/06/01 AHI 117/ hr, desaturation to 60%, body weight 324 lbs  =========================================================   06/17/22- 61 yoM never smoker followed for OSA, complicated by  AFib/ Xarelto , VTach/ AICD, HBP, Chronic Systolic CHF/ Pacemaker, Amiodarone  induce hypothyroid, Pulm Hypertension, DM2, Morbid obesity, Gout, Diverticulosis, Omental Infarction, -Advair 250, Ventolin  hfa,   BIPAP 17/13/  Adapt Download- not available Body weight today-310 lbs Covid vax-4 Moderna -----Pt f/u he is using the machine, it is not transmitting data to resmed. Machine is working well, only question pt has is regarding replacing the machine since it's been a while. He sleeps better with CPAP and has no specific concerns. Says he got good reports recently from his cardiologist and primary doctor.  Has lost some weight.  08/04/24-  63 yoM never smoker followed for OSA, complicated by  AFib/ Xarelto , VTach/ AICD, HBP, Chronic Systolic CHF/ Pacemaker, Amiodarone  induce hypothyroid, Pulm Hypertension, DM2, Morbid obesity, Gout, Diverticulosis, Omental Infarction, -Advair 250, Ventolin  hfa,   BIPAP 17/13/  Adapt Download-  Body weight today- Excellent compliance and control on BIPAP at LOV in 2024.       ROS-see HPI   + = positive Constitutional:    +weight loss, night sweats, fevers, chills, fatigue, lassitude. HEENT:    headaches, difficulty swallowing, tooth/dental problems, sore throat,       sneezing, itching, ear ache, nasal congestion, post nasal drip, snoring CV:    chest pain, orthopnea, PND, swelling in lower extremities, anasarca,                                   dizziness, palpitations Resp:   shortness of breath with exertion or at rest.                productive cough,   non-productive cough, coughing up of  blood.              change in color of mucus.  wheezing.   Skin:    rash or lesions. GI:  No-   heartburn, indigestion, abdominal pain, nausea, vomiting, diarrhea,                 change in bowel habits, loss of appetite GU: dysuria, change in color of urine, no urgency or frequency.   flank pain. MS:   joint pain, stiffness, decreased range of motion, back pain. Neuro-     nothing unusual Psych:  change in mood or affect.  depression or anxiety.   memory loss.  OBJ- Physical Exam General- Alert, Oriented, Affect-appropriate, Distress- none acute, + morbidly obese Skin- rash-none, lesions- none, excoriation- none Lymphadenopathy- none Head- atraumatic            Eyes- Gross vision intact, PERRLA, conjunctivae and secretions clear            Ears- Hearing, canals-normal            Nose- Clear, no-Septal dev, mucus, polyps, erosion, perforation             Throat-+ upper plate, Mallampati III , mucosa clear , drainage- none, tonsils- atrophic Neck- flexible , trachea midline, no stridor , thyroid  nl, carotid no bruit Chest - symmetrical excursion , unlabored           Heart/CV- RRR , no murmur , no  gallop  , no rub, nl s1 s2                           - JVD- none , edema- none, stasis changes- none, varices- none           Lung- clear to P&A, wheeze- none, cough- none , dullness-none, rub- none           Chest wall- + AICD Abd-  Br/ Gen/ Rectal- Not done, not indicated Extrem- cyanosis- none, clubbing, none, atrophy- none, strength- nl Neuro- grossly intact to observation

## 2024-08-03 NOTE — Progress Notes (Signed)
 EPIC Encounter for ICM Monitoring  Patient Name: Gary Hutchinson is a 63 y.o. male Date: 08/03/2024 Primary Care Physican: Onita Rush, MD Primary Cardiologist: [name] Electrophysiologist: Camnitz Bi-V Pacing: 97.7%  11/12/2023  Weight: 312 lbs 11/25/2023 Weight: 312 lbs 02/09/2024 Weight:  319 lbs 5/212025 Weight: 316 lbs  03/16/2024 Office Weight: 318 lbs 04/22/2024 Weight:  315 lbs 05/11/2024 Office Weight: 313 lbs 08/03/2024 Weight: 317 lbs   Since 14-Jul-2024 <0.1 hr/day (<0.1%)    Spoke with patient and heart failure questions reviewed.  Transmission results reviewed.  Pt asymptomatic for fluid accumulation.  Reports feeling well at this time and voices no complaints.       Diet: Attempts to limit salt intake   Since 06/24/2024 ICM Remote Transmission: Optivol thoracic impedance suggesting normal fluid levels.    Prescribed dosage:  Torsemide  20 mg Take 2 tablets (40 mg total) by mouth every morning and 1 tablet (20 total) at bedtime.  He reports Dr [name] has approved that he can take extra Torsemide  or Metolazone  when needed.  Potassium 20 mEq take 2 tablets (40 mEq total) by mouth as directed.  Please take 40 mEq when you take your Metolazone .  Metolazone  2.5 mg 1 tablet as needed (for edema).   Spironolactone  25 mg take 0.5 tablet (12.5 mg total) by mouth daily   Labs: 06/10/2024 Creatinine 1.80, BUN 45, Potassium 4.5, Sodium 140, GFR 42  05/27/2024 Creatinine 1.70, BUN 40, Potassium 4.4, Sodium 141  05/11/2024 Creatinine 2.25, BUN 58, Potassium 3.7, Sodium 135, GFR 32 05/02/2024 Creatinine 2.02, BUN 44, Potassium 4.6, Sodium 136 04/22/2024 Creatinine 2.02, BUN 44, Potassium 4.6, Sodium 136, GFR 36  03/16/2024 Creatinine 1.87, BUN 56, Potassium 4.3, Sodium 142, GFR 40  A complete set of results can be found in Results Review.   Recommendations:  No changes and encouraged to call if experiencing any fluid symptoms.   Follow-up plan: ICM clinic phone appointment on  09/05/2024.   91 day device clinic remote transmission 08/10/2024.     EP/Cardiology Office Visits:  Recall 11/07/2024 with Dr Cherrie.  09/26/2024 with Charlies Arthur, PA.     Copy of ICM check sent to Dr. Inocencio.       Remote monitoring is medically necessary for Heart Failure Management.    Daily Thoracic Impedance ICM trend: 06/24/2024 through 08/01/2024.    12-14 Month Thoracic Impedance ICM trend:     Mitzie GORMAN Garner, RN 08/03/2024 11:36 AM

## 2024-08-04 ENCOUNTER — Ambulatory Visit: Payer: 59 | Admitting: Internal Medicine

## 2024-08-10 ENCOUNTER — Ambulatory Visit

## 2024-08-10 DIAGNOSIS — I5022 Chronic systolic (congestive) heart failure: Secondary | ICD-10-CM | POA: Diagnosis not present

## 2024-08-11 LAB — CUP PACEART REMOTE DEVICE CHECK
Battery Remaining Longevity: 133 mo
Battery Voltage: 3.07 V
Brady Statistic RV Percent Paced: 0.53 %
Date Time Interrogation Session: 20251105002827
HighPow Impedance: 87 Ohm
Lead Channel Impedance Value: 1121 Ohm
Lead Channel Impedance Value: 1159 Ohm
Lead Channel Impedance Value: 1178 Ohm
Lead Channel Impedance Value: 342 Ohm
Lead Channel Impedance Value: 342 Ohm
Lead Channel Impedance Value: 418 Ohm
Lead Channel Impedance Value: 475 Ohm
Lead Channel Impedance Value: 494 Ohm
Lead Channel Impedance Value: 532 Ohm
Lead Channel Impedance Value: 627 Ohm
Lead Channel Impedance Value: 722 Ohm
Lead Channel Impedance Value: 874 Ohm
Lead Channel Impedance Value: 950 Ohm
Lead Channel Pacing Threshold Amplitude: 0.5 V
Lead Channel Pacing Threshold Amplitude: 0.875 V
Lead Channel Pacing Threshold Amplitude: 1.375 V
Lead Channel Pacing Threshold Pulse Width: 0.4 ms
Lead Channel Pacing Threshold Pulse Width: 0.4 ms
Lead Channel Pacing Threshold Pulse Width: 0.4 ms
Lead Channel Sensing Intrinsic Amplitude: 1.9 mV
Lead Channel Sensing Intrinsic Amplitude: 13.1 mV
Lead Channel Setting Pacing Amplitude: 1.5 V
Lead Channel Setting Pacing Amplitude: 2 V
Lead Channel Setting Pacing Amplitude: 2 V
Lead Channel Setting Pacing Pulse Width: 0.4 ms
Lead Channel Setting Pacing Pulse Width: 0.4 ms
Lead Channel Setting Sensing Sensitivity: 0.3 mV
Zone Setting Status: 755011
Zone Setting Status: 755011
Zone Setting Status: 755011

## 2024-08-12 NOTE — Progress Notes (Signed)
 Remote ICD Transmission

## 2024-08-16 ENCOUNTER — Ambulatory Visit: Payer: Self-pay | Admitting: Cardiology

## 2024-08-19 ENCOUNTER — Encounter: Payer: Self-pay | Admitting: Adult Health

## 2024-08-19 ENCOUNTER — Ambulatory Visit (INDEPENDENT_AMBULATORY_CARE_PROVIDER_SITE_OTHER): Admitting: Adult Health

## 2024-08-19 VITALS — BP 126/58 | HR 60 | Ht 65.0 in | Wt 320.0 lb

## 2024-08-19 DIAGNOSIS — G4733 Obstructive sleep apnea (adult) (pediatric): Secondary | ICD-10-CM | POA: Diagnosis not present

## 2024-08-19 NOTE — Progress Notes (Signed)
 @Patient  ID: Gary Hutchinson, male    DOB: [birth year], 63 y.o.   MRN: [id number]  Chief Complaint  Patient presents with   Obstructive Sleep Apnea    F/u    Referring provider: Onita Rush, MD  HPI: 63 year old male followed for very severe sleep apnea on nocturnal BiPAP Medical history significant for A-fib on Xarelto , V. tach status post AICD, chronic systolic heart failure, pulmonary pretension, coronary artery disease and diabetes    TEST/EVENTS : Reviewed 08/19/2024  2002 NPSG>AHI 117   Discussed the use of AI scribe software for clinical note transcription with the patient, who gave verbal consent to proceed.  History of Present Illness Gary Hutchinson is a 63 year old male with sleep apnea who presents for a one-year follow-up visit.  He uses his BiPAP machine nightly with a full face mask and feels it is effective. He receives supplies through a home care company. He generally feels rested but notes occasional variations in sleep quality.  His diabetes management is ongoing, with an upcoming appointment for further evaluation. He finds weight management challenging while on insulin  but is actively working to control it.  He maintains an active lifestyle by walking and engaging in daily activities. He has received his flu shot for the season.  He is under the care of a cardiologist for heart-related issues and reports stability.  BIPAP  download shows 100% compliance.  Daily average usage at 6 hours.  On auto BiPAP IPAP max 17 EPAP minimum 13, AHI 4.5/hour  No Known Allergies  Immunization History  Administered Date(s) Administered   Influenza Inj Mdck Quad Pf 06/18/2018   Influenza Split 07/06/2012   Influenza Whole 05/06/2010   Influenza,inj,Quad PF,6+ Mos 07/06/2013, 07/18/2015, 06/04/2016, 06/11/2017, 06/22/2019, 07/04/2023   Influenza-Unspecified 06/09/2018   Moderna SARS-COV2 Booster Vaccination 07/04/2023   Moderna Sars-Covid-2 Vaccination 01/06/2020,  01/27/2020   Tdap 06/04/2016    Past Medical History:  Diagnosis Date   AICD (automatic cardioverter/defibrillator) present    [employer]   Alcohol abuse    now quit   Anemia    iron defi   Arthritis    fingers, knees, some days all my joints ache (11/16/2015)   Asthma    Cholecystitis    Chronic systolic congestive heart failure (HCC)    a. RHC (02/2011) RA 31/27, RV 71/27, PA67/45, PCWP 46, PA 49%, Fick CO: 3.4 b. ECHO (03/2014) EF 30-35%, grade II DD, mild MR, RV poorly visualized appears mildly decreased c. RHC (05/2014) RA 13, RV 54/4/11, PA 60/22 (37), PCWP 17, Fick CO/CI: 4.4 / 1.9, Thermo CO/CI: 3.8 /1.6, PVR 5.2 WU, PA 57% and 59%   Diverticulosis of colon    Gout    Hemorrhoids, internal    Hyperlipidemia    Hypertension    Morbid obesity (HCC)    Nonischemic cardiomyopathy (HCC)    a. LHC (02/2011) normal coronaries   OSA on CPAP    Paroxysmal atrial fibrillation (HCC)    Pneumonia 2000s X 1   Pulmonary hypertension (HCC)    Renal insufficiency    Type II diabetes mellitus (HCC)    Ventricular tachycardia (HCC)    s/p MDT ICD implant    Tobacco History: Social History   Tobacco Use  Smoking Status Never  Smokeless Tobacco Never   Counseling given: Not Answered   Outpatient Medications Prior to Visit  Medication Sig Dispense Refill   acetaminophen  (TYLENOL ) 500 MG tablet Take 500 mg by mouth every 6 (six) hours as  needed for moderate pain or headache.      albuterol  (VENTOLIN  HFA) 108 (90 Base) MCG/ACT inhaler Inhale 2 puffs into the lungs every 6 (six) hours as needed for wheezing or shortness of breath.     allopurinol  (ZYLOPRIM ) 100 MG tablet Take 100 mg by mouth daily.     atorvastatin  (LIPITOR) 80 MG tablet Take 80 mg by mouth at bedtime.     colchicine  0.6 MG tablet Take 0.6 mg by mouth daily as needed (for gout flares).      dapagliflozin propanediol (FARXIGA) 10 MG TABS tablet Take 10 mg by mouth daily.     digoxin  (LANOXIN ) 0.25 MG tablet TAKE 1/2  TABLET BY MOUTH EVERY OTHER DAY 45 tablet 0   ENTRESTO  97-103 MG TAKE 1 TABLET BY MOUTH TWICE A DAY 180 tablet 3   Fluticasone -Salmeterol (ADVAIR) 250-50 MCG/DOSE AEPB Inhale 1 puff into the lungs every 12 (twelve) hours as needed (for flares/shortness of breath). Reported on 02/06/2016     hydrALAZINE  (APRESOLINE ) 25 MG tablet TAKE 1 TABLET BY MOUTH THREE TIMES A DAY 270 tablet 1   insulin  lispro (HUMALOG) 100 UNIT/ML injection Inject 10 Units into the skin See admin instructions. 4 to 5 times daily with a meal, depending on how many meals are eaten that day.     isosorbide  mononitrate (IMDUR ) 30 MG 24 hr tablet TAKE 1 TABLET (30 MG TOTAL) BY MOUTH IN THE MORNING AND AT BEDTIME. 180 tablet 3   metolazone  (ZAROXOLYN ) 2.5 MG tablet Take 2.5 mg by mouth daily as needed (for edema).     montelukast  (SINGULAIR ) 10 MG tablet Take 10 mg by mouth daily as needed (allergies).     ondansetron  (ZOFRAN -ODT) 8 MG disintegrating tablet Take 1 tablet (8 mg total) by mouth every 8 (eight) hours as needed for nausea or vomiting. 20 tablet 0   OZEMPIC, 1 MG/DOSE, 4 MG/3ML SOPN Inject 1 mg into the skin once a week.     potassium chloride  SA (KLOR-CON  M) 20 MEQ tablet Take 2 tablets (40 mEq total) by mouth as directed. Please take 40 meq when you take your Metolazone  60 tablet 6   sotalol  (BETAPACE ) 80 MG tablet Take 1 tablet (80 mg total) by mouth 2 (two) times daily. 180 tablet 2   spironolactone  (ALDACTONE ) 25 MG tablet Take 12.5 mg by mouth daily.      torsemide  (DEMADEX ) 20 MG tablet TAKE 2 TABLETS (40 MG TOTAL) BY MOUTH EVERY MORNING AND 1 TABLET (20 MG TOTAL) AT BEDTIME. 270 tablet 3   TRESIBA FLEXTOUCH 200 UNIT/ML FlexTouch Pen Inject 48 Units into the skin at bedtime.     XARELTO  20 MG TABS tablet TAKE 1 TABLET BY MOUTH DAILY WITH SUPPER 90 tablet 3   No facility-administered medications prior to visit.     Review of Systems:   Constitutional:   No  weight loss, night sweats,  Fevers, chills, fatigue, or   lassitude.  HEENT:   No headaches,  Difficulty swallowing,  Tooth/dental problems, or  Sore throat,                No sneezing, itching, ear ache, nasal congestion, post nasal drip,   CV:  No chest pain,  Orthopnea, PND, swelling in lower extremities, anasarca, dizziness, palpitations, syncope.   GI  No heartburn, indigestion, abdominal pain, nausea, vomiting, diarrhea, change in bowel habits, loss of appetite, bloody stools.   Resp:    No excess mucus, no productive cough,  No non-productive  cough,  No coughing up of blood.  No change in color of mucus.  No wheezing.  No chest wall deformity  Skin: no rash or lesions.  GU: no dysuria, change in color of urine, no urgency or frequency.  No flank pain, no hematuria   MS:  No joint pain or swelling.  No decreased range of motion.  No back pain.    Physical Exam  BP (!) 126/58   Pulse 60   Ht 5' 5 (1.651 m) Comment: Per pt  Wt (!) 320 lb (145.2 kg)   SpO2 98% Comment: RA  BMI 53.25 kg/m   GEN: A/Ox3; pleasant , NAD, BMI 53   HEENT:  [place]/AT,  NOSE-clear, THROAT-clear, no lesions, no postnasal drip or exudate noted.   NECK:  Supple w/ fair ROM; no JVD; normal carotid impulses w/o bruits; no thyromegaly or nodules palpated; no lymphadenopathy.    RESP  Clear  P & A; w/o, wheezes/ rales/ or rhonchi. no accessory muscle use, no dullness to percussion  CARD:  RRR, no m/r/g, no peripheral edema, pulses intact, no cyanosis or clubbing.  GI:   Soft & nt; nml bowel sounds; no organomegaly or masses detected.   Musco: Warm bil, no deformities or joint swelling noted.   Neuro: alert, no focal deficits noted.    Skin: Warm, no lesions or rashes    Lab Results:Reviewed 08/19/2024   CBC    Component Value Date/Time   WBC 6.6 06/10/2024 0835   WBC 7.4 01/21/2024 0847   RBC 4.95 06/10/2024 0835   RBC 4.32 01/21/2024 0847   HGB 12.1 (L) 06/10/2024 0835   HCT 40.7 06/10/2024 0835   PLT 184 06/10/2024 0835   MCV 82 06/10/2024  0835   MCH 24.4 (L) 06/10/2024 0835   MCH 26.2 01/21/2024 0847   MCHC 29.7 (L) 06/10/2024 0835   MCHC 31.7 01/21/2024 0847   RDW 15.9 (H) 06/10/2024 0835   LYMPHSABS 1.3 01/21/2024 0847   MONOABS 0.5 01/21/2024 0847   EOSABS 0.3 01/21/2024 0847   BASOSABS 0.1 01/21/2024 0847    BMET    Component Value Date/Time   NA 140 06/10/2024 0835   K 4.5 06/10/2024 0835   CL 103 06/10/2024 0835   CO2 18 (L) 06/10/2024 0835   GLUCOSE 175 (H) 06/10/2024 0835   GLUCOSE 148 (H) 05/27/2024 1048   BUN 45 (H) 06/10/2024 0835   CREATININE 1.80 (H) 06/10/2024 0835   CREATININE 1.38 (H) 11/21/2015 0733   CALCIUM  9.1 06/10/2024 0835   GFRNONAA 45 (L) 05/27/2024 1048   GFRAA 49 (L) 12/14/2019 1039    BNP    Component Value Date/Time   BNP 26.2 05/11/2024 1029    ProBNP    Component Value Date/Time   PROBNP 231 (H) 10/17/2022 0925   PROBNP 2,453.0 (H) 06/01/2014 0949    Imaging: CUP PACEART REMOTE DEVICE CHECK Result Date: 08/11/2024 ICD: Scheduled remote reviewed. Normal device function.  Presenting rhythm: AS/VP 2 AF since last in-clinic, one < 1 min, the other 32 min 33 sec, Xarelto  per EPIC EMR, AF burden 0.26% Next remote transmission per protocol. ML, CVRS   Administration History     None           No data to display          No results found for: NITRICOXIDE     03/10/2019    2:00 PM  Results of the [facility] flowsheet  Sitting and reading 0  Watching TV 1  Sitting, inactive in a public place (e.g. a theatre or a meeting) 0  As a passenger in a car for an hour without a break 0  Lying down to rest in the afternoon when circumstances permit 2  Sitting and talking to someone 0  Sitting quietly after a lunch without alcohol 0  In a car, while stopped for a few minutes in traffic 0  Total score 3        Assessment & Plan:   Assessment and Plan Assessment & Plan Obstructive sleep apnea  -excellent control and compliance with perceived benefit Obstructive  sleep apnea is well-managed with BiPAP therapy. He is compliant with nightly use and reports feeling rested. No issues with the BiPAP machine reported. Continue BiPAP therapy with a full face mask. Use distilled water in the BiPAP chamber and maintain cleanliness of the machine, including wiping the mask and rinsing the tubing. Ensure supplies are obtained through the home care company.  Follow-up in 1 year  Morbid obesity BMI 53-stable weight.  Continue on healthy weight loss He is encouraged to maintain an active lifestyle and work towards weight management. Regular physical activity, such as walking daily, is encouraged. Continue efforts in weight management.  Type 2 diabetes mellitus appears stable.  Continue follow-up with primary care Type 2 diabetes is being managed by his primary care provider. He is aware of the challenges with weight fluctuations affecting insulin  management. Continue follow-up with the primary care provider for diabetes management.  Plan  Patient Instructions  Continue on BIPAP At bedtime   Keep up the good work Need to wear for at least 6 hrs each night  Do not drive if sleepy.  Work on weight loss.  Follow up in 1 year and As needed            Madelin Stank, NP 08/19/2024

## 2024-08-19 NOTE — Patient Instructions (Addendum)
 Continue on BIPAP At bedtime   Keep up the good work Need to wear for at least 6 hrs each night  Do not drive if sleepy.  Work on weight loss.  Follow up in 1 year and As needed

## 2024-08-23 ENCOUNTER — Emergency Department (HOSPITAL_BASED_OUTPATIENT_CLINIC_OR_DEPARTMENT_OTHER)
Admission: EM | Admit: 2024-08-23 | Discharge: 2024-08-23 | Disposition: A | Discharge status: Home / Self Care | Attending: Emergency Medicine | Admitting: Emergency Medicine

## 2024-08-23 ENCOUNTER — Emergency Department (HOSPITAL_BASED_OUTPATIENT_CLINIC_OR_DEPARTMENT_OTHER)
Admit: 2024-08-23 | Discharge: 2024-08-23 | Disposition: A | Discharge status: Home / Self Care | Attending: Emergency Medicine | Admitting: Emergency Medicine

## 2024-08-23 ENCOUNTER — Encounter (HOSPITAL_BASED_OUTPATIENT_CLINIC_OR_DEPARTMENT_OTHER): Payer: Self-pay

## 2024-08-23 DIAGNOSIS — I4891 Unspecified atrial fibrillation: Secondary | ICD-10-CM | POA: Diagnosis not present

## 2024-08-23 DIAGNOSIS — I10 Essential (primary) hypertension: Secondary | ICD-10-CM | POA: Diagnosis not present

## 2024-08-23 DIAGNOSIS — M79602 Pain in left arm: Secondary | ICD-10-CM | POA: Insufficient documentation

## 2024-08-23 DIAGNOSIS — Z7901 Long term (current) use of anticoagulants: Secondary | ICD-10-CM | POA: Diagnosis not present

## 2024-08-23 DIAGNOSIS — Z79899 Other long term (current) drug therapy: Secondary | ICD-10-CM | POA: Insufficient documentation

## 2024-08-23 LAB — CBC WITH DIFFERENTIAL/PLATELET
Abs Immature Granulocytes: 0.04 K/uL (ref 0.00–0.07)
Basophils Absolute: 0 K/uL (ref 0.0–0.1)
Basophils Relative: 1 %
Eosinophils Absolute: 0.5 K/uL (ref 0.0–0.5)
Eosinophils Relative: 8 %
HCT: 37.4 % — ABNORMAL LOW (ref 39.0–52.0)
Hemoglobin: 11.5 g/dL — ABNORMAL LOW (ref 13.0–17.0)
Immature Granulocytes: 1 %
Lymphocytes Relative: 25 %
Lymphs Abs: 1.8 K/uL (ref 0.7–4.0)
MCH: 25.5 pg — ABNORMAL LOW (ref 26.0–34.0)
MCHC: 30.7 g/dL (ref 30.0–36.0)
MCV: 82.9 fL (ref 80.0–100.0)
Monocytes Absolute: 0.7 K/uL (ref 0.1–1.0)
Monocytes Relative: 10 %
Neutro Abs: 4.1 K/uL (ref 1.7–7.7)
Neutrophils Relative %: 55 %
Platelets: 178 K/uL (ref 150–400)
RBC: 4.51 MIL/uL (ref 4.22–5.81)
RDW: 16.7 % — ABNORMAL HIGH (ref 11.5–15.5)
WBC: 7.1 K/uL (ref 4.0–10.5)
nRBC: 0 % (ref 0.0–0.2)

## 2024-08-23 LAB — COMPREHENSIVE METABOLIC PANEL WITH GFR
ALT: 19 U/L (ref 0–44)
AST: 22 U/L (ref 15–41)
Albumin: 3.9 g/dL (ref 3.5–5.0)
Alkaline Phosphatase: 114 U/L (ref 38–126)
Anion gap: 12 (ref 5–15)
BUN: 49 mg/dL — ABNORMAL HIGH (ref 8–23)
CO2: 22 mmol/L (ref 22–32)
Calcium: 9 mg/dL (ref 8.9–10.3)
Chloride: 106 mmol/L (ref 98–111)
Creatinine, Ser: 1.63 mg/dL — ABNORMAL HIGH (ref 0.61–1.24)
GFR, Estimated: 47 mL/min — ABNORMAL LOW (ref >60)
Glucose, Bld: 155 mg/dL — ABNORMAL HIGH (ref 70–99)
Potassium: 4.1 mmol/L (ref 3.5–5.1)
Sodium: 140 mmol/L (ref 135–145)
Total Bilirubin: 0.3 mg/dL (ref 0.0–1.2)
Total Protein: 7 g/dL (ref 6.5–8.1)

## 2024-08-23 NOTE — ED Provider Notes (Signed)
 [facility] Provider Note   CSN: [id number] Arrival date & time: 08/23/24  [id number]     Patient presents with: Arm Pain   Gary Hutchinson is a 63 y.o. male.   Patient is a 63 year old male with a history of cardiomyopathy with a pacer/defibrillator, atrial fibrillation on Xarelto , pulmonary hypertension, hypertension, obstructive sleep apnea.  Patient presenting today with complaints of left arm pain.  He reports having had an echocardiogram performed with contrast several weeks ago.  It took several sticks to get an IV into his left upper arm and has been experiencing swelling and discomfort since.  The pain seems to come and go.  He describes it as a toothache.  He is concerned he may have a blood clot.  He does report being compliant with his Xarelto .  No chest pain or difficulty breathing.       Prior to Admission medications   Medication Sig Start Date End Date Taking? Authorizing Provider  acetaminophen  (TYLENOL ) 500 MG tablet Take 500 mg by mouth every 6 (six) hours as needed for moderate pain or headache.     [provider]  albuterol  (VENTOLIN  HFA) 108 (90 Base) MCG/ACT inhaler Inhale 2 puffs into the lungs every 6 (six) hours as needed for wheezing or shortness of breath.    [provider]  allopurinol  (ZYLOPRIM ) 100 MG tablet Take 100 mg by mouth daily. 12/28/20   [provider]  atorvastatin  (LIPITOR) 80 MG tablet Take 80 mg by mouth at bedtime.    [provider]  colchicine  0.6 MG tablet Take 0.6 mg by mouth daily as needed (for gout flares).     [provider]  dapagliflozin propanediol (FARXIGA) 10 MG TABS tablet Take 10 mg by mouth daily.    [provider]  digoxin  (LANOXIN ) 0.25 MG tablet TAKE 1/2 TABLET BY MOUTH EVERY OTHER DAY 03/02/24   [name], Toribio SAUNDERS, MD  ENTRESTO  97-103 MG TAKE 1 TABLET BY MOUTH TWICE A DAY 06/13/24   [name], Toribio SAUNDERS, MD   Fluticasone -Salmeterol (ADVAIR) 250-50 MCG/DOSE AEPB Inhale 1 puff into the lungs every 12 (twelve) hours as needed (for flares/shortness of breath). Reported on 02/06/2016    [provider]  hydrALAZINE  (APRESOLINE ) 25 MG tablet TAKE 1 TABLET BY MOUTH THREE TIMES A DAY 04/12/24   [name], Toribio SAUNDERS, MD  insulin  lispro (HUMALOG) 100 UNIT/ML injection Inject 10 Units into the skin See admin instructions. 4 to 5 times daily with a meal, depending on how many meals are eaten that day.    [provider]  isosorbide  mononitrate (IMDUR ) 30 MG 24 hr tablet TAKE 1 TABLET (30 MG TOTAL) BY MOUTH IN THE MORNING AND AT BEDTIME. 04/07/24   Milford, Harlene HERO, FNP  metolazone  (ZAROXOLYN ) 2.5 MG tablet Take 2.5 mg by mouth daily as needed (for edema).    [provider]  montelukast  (SINGULAIR ) 10 MG tablet Take 10 mg by mouth daily as needed (allergies).    [provider]  ondansetron  (ZOFRAN -ODT) 8 MG disintegrating tablet Take 1 tablet (8 mg total) by mouth every 8 (eight) hours as needed for nausea or vomiting. 03/23/21   Brien Therisa MATSU, MD  OZEMPIC, 1 MG/DOSE, 4 MG/3ML SOPN Inject 1 mg into the skin once a week. 02/26/22   [provider]  potassium chloride  SA (KLOR-CON  M) 20 MEQ tablet Take 2 tablets (40 mEq total) by mouth as directed. Please take 40 meq when you take  your Metolazone  08/04/23   Glena Harlene HERO, FNP  sotalol  (BETAPACE ) 80 MG tablet Take 1 tablet (80 mg total) by mouth 2 (two) times daily. 04/20/24   Lesia Ozell Barter, [name]  spironolactone  (ALDACTONE ) 25 MG tablet Take 12.5 mg by mouth daily.  10/28/15   [provider]  torsemide  (DEMADEX ) 20 MG tablet TAKE 2 TABLETS (40 MG TOTAL) BY MOUTH EVERY MORNING AND 1 TABLET (20 MG TOTAL) AT BEDTIME. 06/10/24   [name], Toribio SAUNDERS, MD  TRESIBA FLEXTOUCH 200 UNIT/ML FlexTouch Pen Inject 48 Units into the skin at bedtime. 01/07/23   [provider]  XARELTO  20 MG TABS tablet TAKE 1 TABLET BY  MOUTH DAILY WITH SUPPER 12/16/23   Sabharwal, Aditya, DO    Allergies: Patient has no known allergies.    Review of Systems  All other systems reviewed and are negative.   Updated Vital Signs BP (!) 144/68 (BP Location: Right Arm)   Pulse 73   Temp 98.2 F (36.8 C) (Oral)   Resp 19   Ht 5' 5 (1.651 m)   Wt (!) 145 kg   SpO2 99%   BMI 53.20 kg/m   Physical Exam Vitals and nursing note reviewed.  Constitutional:      General: He is not in acute distress.    Appearance: He is well-developed. He is not diaphoretic.  HENT:     Head: Normocephalic and atraumatic.  Cardiovascular:     Rate and Rhythm: Normal rate and regular rhythm.     Heart sounds: No murmur heard.    No friction rub.  Pulmonary:     Effort: Pulmonary effort is normal. No respiratory distress.     Breath sounds: Normal breath sounds. No wheezing or rales.  Abdominal:     General: Bowel sounds are normal. There is no distension.     Palpations: Abdomen is soft.     Tenderness: There is no abdominal tenderness.  Musculoskeletal:        General: Normal range of motion.     Cervical back: Normal range of motion and neck supple.     Comments: The left upper arm is grossly normal in appearance.  There is no significant swelling or edema.  Ulnar and radial pulses are easily palpable and motor and sensation are intact throughout the entire hand.  Skin:    General: Skin is warm and dry.  Neurological:     Mental Status: He is alert and oriented to person, place, and time.     Coordination: Coordination normal.     (all labs ordered are listed, but only abnormal results are displayed) Labs Reviewed  CBC WITH DIFFERENTIAL/PLATELET - Abnormal; Notable for the following components:      Result Value   Hemoglobin 11.5 (*)    HCT 37.4 (*)    MCH 25.5 (*)    RDW 16.7 (*)    All other components within normal limits  COMPREHENSIVE METABOLIC PANEL WITH GFR - Abnormal; Notable for the following components:    Glucose, Bld 155 (*)    BUN 49 (*)    Creatinine, Ser 1.63 (*)    GFR, Estimated 47 (*)    All other components within normal limits    EKG: EKG Interpretation Date/Time:  Tuesday August 23 2024 01:15:09 EST Ventricular Rate:  61 PR Interval:    QRS Duration:  119 QT Interval:  449 QTC Calculation: 453 R Axis:   247  Text Interpretation: Atrial sensed ventricular paced rhythm No significant  change since 05/13/2024 Confirmed by Geroldine Berg ([id number]) on 08/23/2024 1:18:13 AM  Radiology: No results found.   Procedures   Medications Ordered in the ED - No data to display                                  Medical Decision Making Amount and/or Complexity of Data Reviewed Labs: ordered.   Patient presenting here with left arm pain as described in the HPI.  Patient arrives here with stable vital signs and is afebrile.  Physical examination basically unremarkable.  The left upper arm is grossly normal in appearance and motor and sensation are intact throughout.  Laboratory studies obtained in triage including CBC and basic metabolic panel.  This shows his baseline renal insufficiency, but no acute findings otherwise.  EKG showing paced rhythm with no acute changes.  At this point, I doubt a DVT, but will make arrangements for an ultrasound in the morning as they have gone home for the day.  He does report being compliant with his Xarelto .     Final diagnoses:  None    ED Discharge Orders     None          Geroldine Berg, MD 08/23/24 [phone]

## 2024-08-23 NOTE — ED Triage Notes (Signed)
 Left arm pain that has been going for weeks Feels like a toothache Assessed left arm, upper arm at least 2x larger than right Significant hx On blood thinners No hx of DVTs

## 2024-08-23 NOTE — Telephone Encounter (Signed)
 Patient informed of negative ultrasound findings.  Reports he still has a mild toothache in his arm but no other complaints at this point in time.

## 2024-08-23 NOTE — Discharge Instructions (Signed)
 Return tomorrow with the given time for an ultrasound of your arm.  Take Tylenol  1000 mg every 6 hours as needed for pain.  Follow-up with primary doctor if symptoms or not improving in the next week.

## 2024-09-02 ENCOUNTER — Other Ambulatory Visit (HOSPITAL_COMMUNITY): Payer: Self-pay | Admitting: Internal Medicine

## 2024-09-05 ENCOUNTER — Ambulatory Visit: Attending: Cardiology

## 2024-09-05 DIAGNOSIS — Z95810 Presence of automatic (implantable) cardiac defibrillator: Secondary | ICD-10-CM | POA: Diagnosis not present

## 2024-09-05 DIAGNOSIS — I5022 Chronic systolic (congestive) heart failure: Secondary | ICD-10-CM | POA: Diagnosis not present

## 2024-09-06 NOTE — Progress Notes (Signed)
 EPIC Encounter for ICM Monitoring  Patient Name: Gary Hutchinson is a 63 y.o. male Date: 09/06/2024 Primary Care Physican: Onita Rush, MD Primary Cardiologist: [name] Electrophysiologist: Camnitz Bi-V Pacing: 97.8%  11/12/2023  Weight: 312 lbs 11/25/2023 Weight: 312 lbs 02/09/2024 Weight:  319 lbs 5/212025 Weight: 316 lbs  03/16/2024 Office Weight: 318 lbs 04/22/2024 Weight:  315 lbs 05/11/2024 Office Weight: 313 lbs 08/03/2024 Weight: 317 lbs   Since 10-Aug-2024 Time in AT/AF  0.0 hours/day (0.0 %)   Spoke with patient and heart failure questions reviewed.  Transmission results reviewed.  Pt asymptomatic for fluid accumulation.  He did not have any fluid symptoms during decreased impedance.   ED visit 08/23/2024 for arm pain.  Took Metolazone  last week.   He has been eating a lot of soups and drinking a lot of fluids.     Diet: Attempts to limit salt intake   Since 08/01/2024 ICM Remote Transmission: Optivol thoracic impedance suggesting possible fluid accumulation starting 08/17/2024 and returning to baseline 09/04/2024.    Prescribed dosage:  Torsemide  20 mg Take 2 tablets (40 mg total) by mouth every morning and 1 tablet (20 total) at bedtime.  He reports Dr [name] has approved that he can take extra Torsemide  or Metolazone  when needed.  Potassium 20 mEq take 2 tablets (40 mEq total) by mouth as directed.  Please take 40 mEq when you take your Metolazone .  Metolazone  2.5 mg 1 tablet as needed (for edema).   Spironolactone  25 mg take 0.5 tablet (12.5 mg total) by mouth daily   Labs: 08/23/2024 Creatinine 1.63, BUN 49, Potassium 4.1, Sodium 140, GFR 47 06/10/2024 Creatinine 1.80, BUN 45, Potassium 4.5, Sodium 140, GFR 42  05/27/2024 Creatinine 1.70, BUN 40, Potassium 4.4, Sodium 141  05/11/2024 Creatinine 2.25, BUN 58, Potassium 3.7, Sodium 135, GFR 32 05/02/2024 Creatinine 2.02, BUN 44, Potassium 4.6, Sodium 136 04/22/2024 Creatinine 2.02, BUN 44, Potassium 4.6, Sodium 136, GFR  36  03/16/2024 Creatinine 1.87, BUN 56, Potassium 4.3, Sodium 142, GFR 40  A complete set of results can be found in Results Review.   Recommendations:  No changes and encouraged to call if experiencing any fluid symptoms.   Follow-up plan: ICM clinic phone appointment on 10/17/2024.   91 day device clinic remote transmission 11/09/2024.     EP/Cardiology Office Visits:  Recall 11/07/2024 with Dr Cherrie.  09/26/2024 with Charlies Arthur, PA.     Copy of ICM check sent to Dr. Inocencio.     Remote monitoring is medically necessary for Heart Failure Management.    Daily Thoracic Impedance ICM trend: 06/24/2024 through 09/05/2024.    12-14 Month Thoracic Impedance ICM trend:     Mitzie GORMAN Garner, RN 09/06/2024 8:13 AM

## 2024-09-15 ENCOUNTER — Ambulatory Visit (HOSPITAL_COMMUNITY): Admission: RE | Admit: 2024-09-15 | Discharge: 2024-09-15 | Attending: Internal Medicine | Admitting: Internal Medicine

## 2024-09-15 ENCOUNTER — Encounter (HOSPITAL_COMMUNITY): Payer: Self-pay | Admitting: Internal Medicine

## 2024-09-15 VITALS — BP 128/80 | HR 68 | Ht 65.0 in | Wt 320.6 lb

## 2024-09-15 DIAGNOSIS — I4819 Other persistent atrial fibrillation: Secondary | ICD-10-CM

## 2024-09-15 DIAGNOSIS — Z5181 Encounter for therapeutic drug level monitoring: Secondary | ICD-10-CM | POA: Diagnosis not present

## 2024-09-15 DIAGNOSIS — Z79899 Other long term (current) drug therapy: Secondary | ICD-10-CM | POA: Diagnosis not present

## 2024-09-15 DIAGNOSIS — D6869 Other thrombophilia: Secondary | ICD-10-CM

## 2024-09-15 NOTE — Progress Notes (Signed)
 Primary Care Physician: Onita Rush, MD Referring Physician: Dr. Kelsie Missy LABOR Pal is a 63 y.o. male with a h/o chronic systolic heart failure, s/p MDT ICD, HTN, atrial fibrillation, DM, OSA on Cpap, hx of amio thyrotoxicity, obesity, that is in the afib clinic for f/u recent ER visit.[name] He received a shock from his ICD after a day full of activities, over 7000 steps. He was found to have rapid afib with RVR which triggered the shock. Dr. Kelsie felt antiarrythmic therapy was indicated and started 80 mg sotalol  bid and he was discharged home.  S/p ICD generator change out on 06/24/2024 by Dr. Inocencio.  On evaluation today, he is currently in AV paced rhythm. Device clinic alert on 6/3 for Afib ongoing since 5/26. He notes that likely dietary indiscretion was a contributing factor. No missed doses of sotalol  80 mg BID or Xarelto  20 mg daily.   On follow up 04/22/24, he is currently in V paced rhythm. Device alert on 7/7 for ongoing Afib since 6/12. No missed doses of sotalol  or Xarelto .   On follow up 05/13/24, patient is currently in AV paced rhythm. S/p DCCV on 7/25. No missed doses of sotalol  or Xarelto .   Follow-up 09/15/2024 for sotalol  surveillance.  Patient is currently in AV paced rhythm.  He has had overall low A-fib burden since last office visit.  No missed doses of sotalol .  No bleeding issues on Xarelto .  Today, he denies symptoms of palpitations, chest pain, shortness of breath, orthopnea, PND, lower extremity edema, dizziness, presyncope, syncope, or neurologic sequela. The patient is tolerating medications without difficulties and is otherwise without complaint today.   Past Medical History:  Diagnosis Date   AICD (automatic cardioverter/defibrillator) present    Medtronic   Alcohol abuse    now quit   Anemia    iron defi   Arthritis    fingers, knees, some days all my joints ache (11/16/2015)   Asthma    Cholecystitis    Chronic systolic congestive heart failure  (HCC)    a. RHC (02/2011) RA 31/27, RV 71/27, PA67/45, PCWP 46, PA 49%, Fick CO: 3.4 b. ECHO (03/2014) EF 30-35%, grade II DD, mild MR, RV poorly visualized appears mildly decreased c. RHC (05/2014) RA 13, RV 54/4/11, PA 60/22 (37), PCWP 17, Fick CO/CI: 4.4 / 1.9, Thermo CO/CI: 3.8 /1.6, PVR 5.2 WU, PA 57% and 59%   Diverticulosis of colon    Gout    Hemorrhoids, internal    Hyperlipidemia    Hypertension    Morbid obesity (HCC)    Nonischemic cardiomyopathy (HCC)    a. LHC (02/2011) normal coronaries   OSA on CPAP    Paroxysmal atrial fibrillation (HCC)    Pneumonia 2000s X 1   Pulmonary hypertension (HCC)    Renal insufficiency    Type II diabetes mellitus (HCC)    Ventricular tachycardia [facility])    s/p MDT ICD implant   Past Surgical History:  Procedure Laterality Date   BIV ICD GENERATOR CHANGEOUT N/A 06/24/2024   Procedure: BIV ICD GENERATOR CHANGEOUT;  Surgeon: Inocencio Soyla Lunger, MD;  Location: [facility] INVASIVE CV LAB;  Service: Cardiovascular;  Laterality: N/A;   CARDIAC CATHETERIZATION  03/08/2004   EF 25-30%   CARDIOVERSION N/A 10/17/2015   Procedure: CARDIOVERSION;  Surgeon: Aleene JINNY Passe, MD;  Location: [facility] ENDOSCOPY;  Service: Cardiovascular;  Laterality: N/A;   CARDIOVERSION N/A 04/29/2024   Procedure: CARDIOVERSION;  Surgeon: Michele Richardson, DO;  Location: [facility] INVASIVE  CV LAB;  Service: Cardiovascular;  Laterality: N/A;   EP IMPLANTABLE DEVICE N/A 11/16/2015   Procedure: BiVI Upgrade;  Surgeon: Lynwood Rakers, MD;  Medtronic Viva Quad XT   INSERTION OF ICD  06/2008   ICD- Medtronic    RIGHT HEART CATHETERIZATION N/A 05/23/2014   Procedure: RIGHT HEART CATH;  Surgeon: Toribio JONELLE Fuel, MD;  Location: [facility] CATH LAB;  Service: Cardiovascular;  Laterality: N/A;   TRANSTHORACIC ECHOCARDIOGRAM  05/27/2008   EF 30-35%   US  ECHOCARDIOGRAPHY  08/22/2008   EF 30-35%    Current Outpatient Medications  Medication Sig Dispense Refill   acetaminophen  (TYLENOL ) 500 MG tablet Take 500 mg by mouth  every 6 (six) hours as needed for moderate pain or headache.      albuterol  (VENTOLIN  HFA) 108 (90 Base) MCG/ACT inhaler Inhale 2 puffs into the lungs every 6 (six) hours as needed for wheezing or shortness of breath.     allopurinol  (ZYLOPRIM ) 100 MG tablet Take 100 mg by mouth daily.     atorvastatin  (LIPITOR) 80 MG tablet Take 80 mg by mouth at bedtime.     colchicine  0.6 MG tablet Take 0.6 mg by mouth daily as needed (for gout flares).      dapagliflozin propanediol (FARXIGA) 10 MG TABS tablet Take 10 mg by mouth daily.     digoxin  (LANOXIN ) 0.25 MG tablet TAKE 1/2 TABLET BY MOUTH EVERY OTHER DAY 45 tablet 0   ENTRESTO  97-103 MG TAKE 1 TABLET BY MOUTH TWICE A DAY 180 tablet 3   Fluticasone -Salmeterol (ADVAIR) 250-50 MCG/DOSE AEPB Inhale 1 puff into the lungs every 12 (twelve) hours as needed (for flares/shortness of breath). Reported on 02/06/2016     hydrALAZINE  (APRESOLINE ) 25 MG tablet TAKE 1 TABLET BY MOUTH THREE TIMES A DAY 270 tablet 1   insulin  lispro (HUMALOG) 100 UNIT/ML injection Inject 10 Units into the skin See admin instructions. 4 to 5 times daily with a meal, depending on how many meals are eaten that day.     isosorbide  mononitrate (IMDUR ) 30 MG 24 hr tablet TAKE 1 TABLET (30 MG TOTAL) BY MOUTH IN THE MORNING AND AT BEDTIME. 180 tablet 3   metolazone  (ZAROXOLYN ) 2.5 MG tablet Take 2.5 mg by mouth daily as needed (for edema).     montelukast  (SINGULAIR ) 10 MG tablet Take 10 mg by mouth daily as needed (allergies).     ondansetron  (ZOFRAN -ODT) 8 MG disintegrating tablet Take 1 tablet (8 mg total) by mouth every 8 (eight) hours as needed for nausea or vomiting. 20 tablet 0   OZEMPIC, 1 MG/DOSE, 4 MG/3ML SOPN Inject 1 mg into the skin once a week.     potassium chloride  SA (KLOR-CON  M) 20 MEQ tablet Take 2 tablets (40 mEq total) by mouth as directed. Please take 40 meq when you take your Metolazone  60 tablet 6   sotalol  (BETAPACE ) 80 MG tablet Take 1 tablet (80 mg total) by mouth 2  (two) times daily. 180 tablet 2   spironolactone  (ALDACTONE ) 25 MG tablet Take 12.5 mg by mouth daily.      torsemide  (DEMADEX ) 20 MG tablet TAKE 2 TABLETS (40 MG TOTAL) BY MOUTH EVERY MORNING AND 1 TABLET (20 MG TOTAL) AT BEDTIME. 270 tablet 3   TRESIBA FLEXTOUCH 200 UNIT/ML FlexTouch Pen Inject 48 Units into the skin at bedtime.     XARELTO  20 MG TABS tablet TAKE 1 TABLET BY MOUTH DAILY WITH SUPPER 90 tablet 3   No current facility-administered medications for this  encounter.    No Known Allergies  ROS- All systems are reviewed and negative except as per the HPI above  Physical Exam: Vitals:   09/15/24 1021  BP: 128/80  Pulse: 68  Weight: (!) 145.4 kg  Height: 5' 5 (1.651 m)    Wt Readings from Last 3 Encounters:  09/15/24 (!) 145.4 kg  08/23/24 (!) 145 kg  08/19/24 (!) 145.2 kg    Labs: Lab Results  Component Value Date   NA 140 08/23/2024   K 4.1 08/23/2024   CL 106 08/23/2024   CO2 22 08/23/2024   GLUCOSE 155 (H) 08/23/2024   BUN 49 (H) 08/23/2024   CREATININE 1.63 (H) 08/23/2024   CALCIUM  9.0 08/23/2024   PHOS 4.1 03/04/2013   MG 3.0 (H) 03/16/2024   Lab Results  Component Value Date   INR 1.72 01/20/2018   Lab Results  Component Value Date   CHOL 181 04/09/2011   HDL 37 (L) 04/09/2011   LDLCALC 117 (H) 04/09/2011   TRIG 137 04/09/2011   GEN- The patient is well appearing, alert and oriented x 3 today.   Neck - no JVD or carotid bruit noted Lungs- Clear to ausculation bilaterally, normal work of breathing Heart- Regular rate and rhythm, no murmurs, rubs or gallops, PMI not laterally displaced Extremities- no clubbing, cyanosis, or edema Skin - no rash or ecchymosis noted   EKG-  EKG Interpretation Date/Time:  Thursday September 15 2024 10:23:39 EST Ventricular Rate:  68 PR Interval:  148 QRS Duration:  120 QT Interval:  440 QTC Calculation: 467 R Axis:   -75  Text Interpretation: Atrial-sensed ventricular-paced rhythm Abnormal ECG When  compared with ECG of 23-Aug-2024 01:15, PREVIOUS ECG IS PRESENT Confirmed by Terra Pac ([id number]) on 09/15/2024 10:30:53 AM    ECHO 11/12/23: 1. Left ventricular ejection fraction, by estimation, is 30 to 35%. The  left ventricle has moderately decreased function. The left ventricle  demonstrates global hypokinesis. The left ventricular internal cavity size  was moderately dilated. Left  ventricular diastolic parameters are indeterminate.   2. Right ventricular systolic function was not well visualized. The right  ventricular size is not well visualized. Tricuspid regurgitation signal is  inadequate for assessing PA pressure.   3. Left atrial size was moderately dilated.   4. The mitral valve is normal in structure. Trivial mitral valve  regurgitation.   5. The aortic valve is tricuspid. Aortic valve regurgitation is not  visualized. No aortic stenosis is present.   CHA2DS2-VASc Score = 4  The patient's score is based upon: CHF History: 1 HTN History: 1 Diabetes History: 1 Stroke History: 0 Vascular Disease History: 1 Age Score: 0 Gender Score: 0       ASSESSMENT AND PLAN: Persistent Atrial Fibrillation (ICD10:  I48.19) The patient's CHA2DS2-VASc score is 4, indicating a 4.8% annual risk of stroke.   S/p DCCV on 7/25.  Patient is currently in AV paced rhythm.  He has low overall A-fib burden and is happy with current management.  No changes recommended at this time.  High risk medication monitoring (ICD10: [id number]) Patient requires ongoing monitoring for anti-arrhythmic medication which has the potential to cause life threatening arrhythmias or AV block. Qtc stable. Continue sotalol  80 mg twice daily. Mag level drawn today. Cmet from 08/23/24 shows stable creatinine and potassium 4.1.  Chronic systolic heart failure Appears euvolemic today.  Secondary Hypercoagulable State (ICD10:  D68.69) The patient is at significant risk for stroke/thromboembolism based upon his  CHA2DS2-VASc Score of  4.  Continue Rivaroxaban  (Xarelto ).  Continue Xarelto  20 mg daily.    Follow up 6 months for sotalol  surveillance.   Dorn Heinrich, [name] Afib Clinic [facility] [address] [place], [place] [address] [phone]

## 2024-09-16 ENCOUNTER — Ambulatory Visit (HOSPITAL_COMMUNITY): Payer: Self-pay | Admitting: Internal Medicine

## 2024-09-16 LAB — MAGNESIUM: Magnesium: 2.6 mg/dL — ABNORMAL HIGH (ref 1.6–2.3)

## 2024-09-25 NOTE — Progress Notes (Deleted)
 " Cardiology Office Note:  .   Date:  09/25/2024  ID:  Gary Hutchinson, DOB [birth year], MRN [id number] PCP: Onita Rush, MD  [facility] Providers Cardiologist:  None Electrophysiologist:  Will Gladis Norton, MD  Advanced Heart Failure:  Toribio Fuel, MD {  History of Present Illness: .   Gary Hutchinson is a 63 y.o. male w/PMHx of  DM, HTN, HLD, OSA w/CPAP  NICM (LHC 2012 w/NOD), chronic CHF (systolic) NSVT AFib  Follows with AHF, EP, AFib clinic team  Saw the AHF team 05/11/24, recent flutter > DCCV, symptomatic with Aflutter/fib, more sluggish Post DCCV July, feeling better Planned for labs  ICD gen change 06/24/24  Saw the Afib clinic 09/15/24, reported good medication compliance, overall low AFib burden, planned for sotalol  surveillance labs    Today's visit is scheduled as his 90 day post gen change ROS:   *** dig level *** symptoms, site, chronic *** AF burden *** sotalol  labs no ekg needed *** xarelto , labs, bleeding, dose   Device information MDT ICD implanted 2009, upgrade to CRT-D 11/16/15, gen change 06/24/24 Primary prevention device  + inappropriate shock for rapid Aflutter (atypical) in the setting of illness w/flu Dec 2018, inappropriate shock for 1:1 AFlutter  Arrhythmia/AAD hx NSVT described, AFib Remotely Amiodarone  ~ 2015 > thyroid  tox Dec 2018 started on sotalol  (AFlutter and hx of VT)  Studies Reviewed: [name]    EKG not done today Done 12/22/5, personally reviewed SR 68bpm, VP (QRS , good BP morphology), QTc 467  DEVICE interrogation done today and reviewed by myself *** Battery and lead measurements are good ***   11/12/23: TTE 1. Left ventricular ejection fraction, by estimation, is 30 to 35%. The  left ventricle has moderately decreased function. The left ventricle  demonstrates global hypokinesis. The left ventricular internal cavity size  was moderately dilated. Left  ventricular diastolic parameters are  indeterminate.   2. Right ventricular systolic function was not well visualized. The right  ventricular size is not well visualized. Tricuspid regurgitation signal is  inadequate for assessing PA pressure.   3. Left atrial size was moderately dilated.   4. The mitral valve is normal in structure. Trivial mitral valve  regurgitation.   5. The aortic valve is tricuspid. Aortic valve regurgitation is not  visualized. No aortic stenosis is present.    Risk Assessment/Calculations:    Physical Exam:   VS:  There were no vitals taken for this visit.   Wt Readings from Last 3 Encounters:  09/15/24 (!) 320 lb 9.6 oz (145.4 kg)  08/23/24 (!) 319 lb 10.7 oz (145 kg)  08/19/24 (!) 320 lb (145.2 kg)    GEN: Well nourished, well developed in no acute distress NECK: No JVD; No carotid bruits CARDIAC: ***RRR, no murmurs, rubs, gallops RESPIRATORY:  *** CTA b/l without rales, wheezing or rhonchi  ABDOMEN: Soft, non-tender, non-distended EXTREMITIES: *** No edema; No deformity   ICD site: *** is stable, no thinning, fluctuation, tethering  ASSESSMENT AND PLAN: .    CRT-D *** intact function *** no programming changes made  paroxysmal AFib AFlutter CHA2DS2Vasc is 3, on Xarelto , *** appropriately dosed *** % burden  NICM Chronic CHF *** optivol *** %BP C/w AHF team  Secondary hypercoagulable state 2/2 AFib     {Are you ordering a CV Procedure (e.g. stress test, cath, DCCV, TEE, etc)?   Press F2        :[id number]}     Dispo: ***  Signed, Charlies Helling  Leverne, [name]   "

## 2024-09-26 ENCOUNTER — Encounter: Admitting: Physician Assistant

## 2024-09-26 ENCOUNTER — Ambulatory Visit: Admitting: Physician Assistant

## 2024-10-17 ENCOUNTER — Ambulatory Visit: Attending: Cardiology

## 2024-10-17 DIAGNOSIS — I5022 Chronic systolic (congestive) heart failure: Secondary | ICD-10-CM

## 2024-10-17 DIAGNOSIS — Z95810 Presence of automatic (implantable) cardiac defibrillator: Secondary | ICD-10-CM

## 2024-10-18 NOTE — Progress Notes (Signed)
 EPIC Encounter for ICM Monitoring  Patient Name: Gary Hutchinson is a 64 y.o. male Date: 10/18/2024 Primary Care Physican: Onita Rush, MD Primary Cardiologist: [name] Electrophysiologist: Camnitz Bi-V Pacing: 97.3%  11/12/2023  Weight: 312 lbs 11/25/2023 Weight: 312 lbs 02/09/2024 Weight:  319 lbs 5/212025 Weight: 316 lbs  03/16/2024 Office Weight: 318 lbs 04/22/2024 Weight:  315 lbs 05/11/2024 Office Weight: 313 lbs 08/03/2024 Weight: 317 lbs   Since 05-Sep-2024 Time in AT/AF  0.0 hours/day (0.0 %)   Spoke with patient and heart failure questions reviewed.  Transmission results reviewed.  Pt asymptomatic for fluid accumulation.    He is doing well at this time.     Diet: Attempts to limit salt intake   Since 09/05/2024 ICM Remote Transmission: Optivol thoracic impedance suggesting normal fluid levels with the exception of possible fluid accumulation starting 09/24/2024-09/29/2024.    Prescribed dosage:  Torsemide  20 mg Take 2 tablets (40 mg total) by mouth every morning and 1 tablet (20 total) at bedtime.  He reports Dr [name] has approved that he can take extra Torsemide  or Metolazone  when needed.  Potassium 20 mEq take 2 tablets (40 mEq total) by mouth as directed.  Please take 40 mEq when you take your Metolazone .  Metolazone  2.5 mg 1 tablet as needed (for edema).   Spironolactone  25 mg take 0.5 tablet (12.5 mg total) by mouth daily   Labs: 08/23/2024 Creatinine 1.63, BUN 49, Potassium 4.1, Sodium 140, GFR 47 06/10/2024 Creatinine 1.80, BUN 45, Potassium 4.5, Sodium 140, GFR 42  05/27/2024 Creatinine 1.70, BUN 40, Potassium 4.4, Sodium 141  05/11/2024 Creatinine 2.25, BUN 58, Potassium 3.7, Sodium 135, GFR 32 05/02/2024 Creatinine 2.02, BUN 44, Potassium 4.6, Sodium 136 04/22/2024 Creatinine 2.02, BUN 44, Potassium 4.6, Sodium 136, GFR 36  03/16/2024 Creatinine 1.87, BUN 56, Potassium 4.3, Sodium 142, GFR 40  A complete set of results can be found in Results Review.    Recommendations:  No changes and encouraged to call if experiencing any fluid symptoms.   Follow-up plan: ICM clinic phone appointment on 11/17/2024.   91 day device clinic remote transmission 11/09/2024.     EP/Cardiology Office Visits:  11/24/2024 with Dr Cherrie.  10/26/2024 with Jodie Passey, PA.   Copy of ICM check sent to Dr. Inocencio.    Remote monitoring is medically necessary for Heart Failure Management.    Daily Thoracic Impedance ICM trend: 07/18/2024 through 10/17/2024.    12-14 Month Thoracic Impedance ICM trend:     Mitzie GORMAN Garner, RN 10/18/2024 3:49 PM

## 2024-10-21 ENCOUNTER — Ambulatory Visit: Admitting: Pulmonary Disease

## 2024-10-26 ENCOUNTER — Encounter: Payer: Self-pay | Admitting: Student

## 2024-10-26 ENCOUNTER — Ambulatory Visit: Attending: Cardiology | Admitting: Student

## 2024-10-26 VITALS — BP 102/60 | HR 67 | Ht 65.0 in | Wt 325.0 lb

## 2024-10-26 DIAGNOSIS — I4819 Other persistent atrial fibrillation: Secondary | ICD-10-CM | POA: Diagnosis not present

## 2024-10-26 DIAGNOSIS — Z95810 Presence of automatic (implantable) cardiac defibrillator: Secondary | ICD-10-CM

## 2024-10-26 DIAGNOSIS — D6869 Other thrombophilia: Secondary | ICD-10-CM

## 2024-10-26 DIAGNOSIS — I5022 Chronic systolic (congestive) heart failure: Secondary | ICD-10-CM | POA: Diagnosis not present

## 2024-10-26 LAB — CUP PACEART INCLINIC DEVICE CHECK
Battery Remaining Longevity: 132 mo
Battery Voltage: 3.04 V
Brady Statistic AP VP Percent: 13.93 %
Brady Statistic AP VS Percent: 0.31 %
Brady Statistic AS VP Percent: 83.59 %
Brady Statistic AS VS Percent: 2.17 %
Brady Statistic RA Percent Paced: 14.18 %
Brady Statistic RV Percent Paced: 0.43 %
Date Time Interrogation Session: 20260121094037
HighPow Impedance: 101 Ohm
HighPow Impedance: 101 Ohm
Implantable Lead Connection Status: 753985
Implantable Lead Connection Status: 753985
Implantable Lead Connection Status: 753985
Implantable Lead Implant Date: 20090901
Implantable Lead Implant Date: 20170210
Implantable Lead Implant Date: 20170210
Implantable Lead Location: 753858
Implantable Lead Location: 753859
Implantable Lead Location: 753860
Implantable Lead Model: 4598
Implantable Lead Model: 5076
Implantable Lead Model: 6935
Implantable Pulse Generator Implant Date: 20250919
Lead Channel Impedance Value: 1140 Ohm
Lead Channel Impedance Value: 1140 Ohm
Lead Channel Impedance Value: 1178 Ohm
Lead Channel Impedance Value: 1178 Ohm
Lead Channel Impedance Value: 1197 Ohm
Lead Channel Impedance Value: 1197 Ohm
Lead Channel Impedance Value: 323 Ohm
Lead Channel Impedance Value: 323 Ohm
Lead Channel Impedance Value: 361 Ohm
Lead Channel Impedance Value: 361 Ohm
Lead Channel Impedance Value: 399 Ohm
Lead Channel Impedance Value: 399 Ohm
Lead Channel Impedance Value: 475 Ohm
Lead Channel Impedance Value: 475 Ohm
Lead Channel Impedance Value: 494 Ohm
Lead Channel Impedance Value: 494 Ohm
Lead Channel Impedance Value: 532 Ohm
Lead Channel Impedance Value: 532 Ohm
Lead Channel Impedance Value: 646 Ohm
Lead Channel Impedance Value: 646 Ohm
Lead Channel Impedance Value: 741 Ohm
Lead Channel Impedance Value: 741 Ohm
Lead Channel Impedance Value: 912 Ohm
Lead Channel Impedance Value: 912 Ohm
Lead Channel Impedance Value: 969 Ohm
Lead Channel Impedance Value: 969 Ohm
Lead Channel Pacing Threshold Amplitude: 0.625 V
Lead Channel Pacing Threshold Amplitude: 0.625 V
Lead Channel Pacing Threshold Amplitude: 0.875 V
Lead Channel Pacing Threshold Amplitude: 0.875 V
Lead Channel Pacing Threshold Amplitude: 1 V
Lead Channel Pacing Threshold Amplitude: 1 V
Lead Channel Pacing Threshold Amplitude: 1 V
Lead Channel Pacing Threshold Amplitude: 1 V
Lead Channel Pacing Threshold Amplitude: 1.25 V
Lead Channel Pacing Threshold Amplitude: 1.25 V
Lead Channel Pacing Threshold Amplitude: 1.25 V
Lead Channel Pacing Threshold Amplitude: 1.25 V
Lead Channel Pacing Threshold Pulse Width: 0.4 ms
Lead Channel Pacing Threshold Pulse Width: 0.4 ms
Lead Channel Pacing Threshold Pulse Width: 0.4 ms
Lead Channel Pacing Threshold Pulse Width: 0.4 ms
Lead Channel Pacing Threshold Pulse Width: 0.4 ms
Lead Channel Pacing Threshold Pulse Width: 0.4 ms
Lead Channel Pacing Threshold Pulse Width: 0.4 ms
Lead Channel Pacing Threshold Pulse Width: 0.4 ms
Lead Channel Pacing Threshold Pulse Width: 0.4 ms
Lead Channel Pacing Threshold Pulse Width: 0.4 ms
Lead Channel Pacing Threshold Pulse Width: 0.4 ms
Lead Channel Pacing Threshold Pulse Width: 0.4 ms
Lead Channel Sensing Intrinsic Amplitude: 12.8 mV
Lead Channel Sensing Intrinsic Amplitude: 12.8 mV
Lead Channel Sensing Intrinsic Amplitude: 3 mV
Lead Channel Sensing Intrinsic Amplitude: 3 mV
Lead Channel Setting Pacing Amplitude: 1.5 V
Lead Channel Setting Pacing Amplitude: 1.75 V
Lead Channel Setting Pacing Amplitude: 2 V
Lead Channel Setting Pacing Pulse Width: 0.4 ms
Lead Channel Setting Pacing Pulse Width: 0.4 ms
Lead Channel Setting Sensing Sensitivity: 0.3 mV
Zone Setting Status: 755011
Zone Setting Status: 755011
Zone Setting Status: 755011

## 2024-10-26 NOTE — Progress Notes (Signed)
" °  Electrophysiology Office Note:   ID:  Gary Hutchinson, DOB 1961-04-01, MRN [id number]  Primary Cardiologist: None Electrophysiologist: Gary Gladis Norton, MD      History of Present Illness:   Gary Hutchinson is a 64 y.o. male with h/o HFrEF, LBBB s/p CRT-D, VT, parox AF, and HTN seen today for routine electrophysiology follow-up s/p Gen Change.  Since last being seen in our clinic the patient reports doing well overall. Having issue paying with medication at the start of year. Pending social security visit and social work/HF. Otherwise, he denies chest pain, palpitations, dyspnea, PND, orthopnea, nausea, vomiting, dizziness, syncope, edema, weight gain, or early satiety.    Review of systems complete and found to be negative unless listed in HPI.   EP Information / Studies Reviewed:    EKG is not ordered today. EKG from 09/15/2024 reviewed which showed AS-VP at 68 bpm with stable intervals on sotalol        ICD Interrogation-  reviewed in detail today,  See PACEART report.  Arrhythmia/Device History MDT ICD implanted 2009, upgrade to CRT-D 11/16/15, gen change 06/24/24   + inappropriate shock for rapid Aflutter (atypical) in the setting of illness w/flu Dec 2018, inappropriate shock for 1:1 AFlutter  Arrhythmia/AAD hx NSVT described, AFib Remotely Amiodarone  ~ 2015 > thyroid  tox Dec 2018 started on sotalol  (AFlutter and hx of VT)   Physical Exam:   VS:  BP 102/60   Pulse 67   Ht 5' 5 (1.651 m)   Wt (!) 325 lb (147.4 kg)   SpO2 98%   BMI 54.08 kg/m    Wt Readings from Last 3 Encounters:  10/26/24 (!) 325 lb (147.4 kg)  09/15/24 (!) 320 lb 9.6 oz (145.4 kg)  08/23/24 (!) 319 lb 10.7 oz (145 kg)     GEN: No acute distress  NECK: No JVD; No carotid bruits CARDIAC: Regular rate and rhythm, no murmurs, rubs, gallops RESPIRATORY:  Clear to auscultation without rales, wheezing or rhonchi  ABDOMEN: Soft, non-tender, non-distended EXTREMITIES:  No edema; No deformity    ASSESSMENT AND PLAN:    Chronic systolic CHF  s/p Medtronic CRT-D  euvolemic today Stable on an appropriate medical regimen Normal ICD function See Pace Art report No changes today  Paroxysmal AF Secondary hypercoagulable state Stable intervals on sotalol  80 mg BID by EKG 09/15/2024 Continue Xarelto  for CHA2DS2VASc of at least 3  H/o VT  Quiescent on sotalol   HTN Stable on current regimen   Disposition:   Follow up with EP Team in 6 months   Signed, Ozell Prentice Passey, [name]  "

## 2024-10-26 NOTE — Patient Instructions (Signed)
 Medication Instructions:  Your physician recommends that you continue on your current medications as directed. Please refer to the Current Medication list given to you today.  *If you need a refill on your cardiac medications before your next appointment, please call your pharmacy*  Follow-Up: At [facility], you and your health needs are our priority.  As part of our continuing mission to provide you with exceptional heart care, our providers are all part of one team.  This team includes your primary Cardiologist (physician) and Advanced Practice Providers or APPs (Physician Assistants and Nurse Practitioners) who all work together to provide you with the care you need, when you need it.  Your next appointment:   6 months  Provider:   Ozell Jodie Passey, [name]

## 2024-10-27 ENCOUNTER — Ambulatory Visit: Payer: Self-pay | Admitting: Cardiology

## 2024-11-01 ENCOUNTER — Other Ambulatory Visit (HOSPITAL_COMMUNITY): Payer: Self-pay

## 2024-11-01 DIAGNOSIS — I5022 Chronic systolic (congestive) heart failure: Secondary | ICD-10-CM

## 2024-11-01 MED ORDER — RIVAROXABAN 20 MG PO TABS: 20.0000 mg | ORAL_TABLET | Freq: Every day | ORAL | 3 refills | Status: AC

## 2024-11-03 NOTE — Progress Notes (Signed)
 31 day ICM Remote transmission canceled due to [facility] clinic is on hold until further notice.  91 day remote monitoring will continue per protocol.

## 2024-11-09 ENCOUNTER — Ambulatory Visit

## 2024-11-10 LAB — CUP PACEART REMOTE DEVICE CHECK
Battery Remaining Longevity: 129 mo
Battery Voltage: 3.04 V
Brady Statistic AP VP Percent: 15.35 %
Brady Statistic AP VS Percent: 0.34 %
Brady Statistic AS VP Percent: 81.97 %
Brady Statistic AS VS Percent: 2.34 %
Brady Statistic RA Percent Paced: 15.6 %
Brady Statistic RV Percent Paced: 0.3 %
Date Time Interrogation Session: 20260203232932
HighPow Impedance: 84 Ohm
Lead Channel Impedance Value: 1083 Ohm
Lead Channel Impedance Value: 1083 Ohm
Lead Channel Impedance Value: 1121 Ohm
Lead Channel Impedance Value: 323 Ohm
Lead Channel Impedance Value: 380 Ohm
Lead Channel Impedance Value: 418 Ohm
Lead Channel Impedance Value: 456 Ohm
Lead Channel Impedance Value: 456 Ohm
Lead Channel Impedance Value: 494 Ohm
Lead Channel Impedance Value: 589 Ohm
Lead Channel Impedance Value: 684 Ohm
Lead Channel Impedance Value: 836 Ohm
Lead Channel Impedance Value: 893 Ohm
Lead Channel Pacing Threshold Amplitude: 0.625 V
Lead Channel Pacing Threshold Amplitude: 0.75 V
Lead Channel Pacing Threshold Amplitude: 1.375 V
Lead Channel Pacing Threshold Pulse Width: 0.4 ms
Lead Channel Pacing Threshold Pulse Width: 0.4 ms
Lead Channel Pacing Threshold Pulse Width: 0.4 ms
Lead Channel Sensing Intrinsic Amplitude: 1.8 mV
Lead Channel Sensing Intrinsic Amplitude: 12.3 mV
Lead Channel Setting Pacing Amplitude: 1.5 V
Lead Channel Setting Pacing Amplitude: 2 V
Lead Channel Setting Pacing Amplitude: 2 V
Lead Channel Setting Pacing Pulse Width: 0.4 ms
Lead Channel Setting Pacing Pulse Width: 0.4 ms
Lead Channel Setting Sensing Sensitivity: 0.3 mV
Zone Setting Status: 755011
Zone Setting Status: 755011
Zone Setting Status: 755011

## 2024-11-17 ENCOUNTER — Ambulatory Visit

## 2024-11-24 ENCOUNTER — Ambulatory Visit (HOSPITAL_COMMUNITY): Admitting: Internal Medicine

## 2025-02-08 ENCOUNTER — Encounter

## 2025-03-16 ENCOUNTER — Ambulatory Visit (HOSPITAL_COMMUNITY): Admitting: Internal Medicine

## 2025-03-31 ENCOUNTER — Ambulatory Visit: Admitting: Student

## 2025-05-10 ENCOUNTER — Encounter
